# Patient Record
Sex: Female | Born: 1960 | Race: White | Hispanic: No | Marital: Married | Smoking: Former smoker
Health system: Southern US, Community
[De-identification: ages and names within clinical notes are randomized; demographics above are authoritative.]

## PROBLEM LIST (undated history)

## (undated) DIAGNOSIS — M51369 Other intervertebral disc degeneration, lumbar region without mention of lumbar back pain or lower extremity pain: Secondary | ICD-10-CM

## (undated) DIAGNOSIS — J45909 Unspecified asthma, uncomplicated: Secondary | ICD-10-CM

## (undated) DIAGNOSIS — F1011 Alcohol abuse, in remission: Secondary | ICD-10-CM

## (undated) DIAGNOSIS — R0600 Dyspnea, unspecified: Secondary | ICD-10-CM

## (undated) DIAGNOSIS — J309 Allergic rhinitis, unspecified: Secondary | ICD-10-CM

## (undated) DIAGNOSIS — G629 Polyneuropathy, unspecified: Secondary | ICD-10-CM

## (undated) DIAGNOSIS — D649 Anemia, unspecified: Secondary | ICD-10-CM

## (undated) DIAGNOSIS — F329 Major depressive disorder, single episode, unspecified: Secondary | ICD-10-CM

## (undated) DIAGNOSIS — K5909 Other constipation: Secondary | ICD-10-CM

## (undated) DIAGNOSIS — F419 Anxiety disorder, unspecified: Secondary | ICD-10-CM

## (undated) DIAGNOSIS — E119 Type 2 diabetes mellitus without complications: Secondary | ICD-10-CM

## (undated) DIAGNOSIS — Z972 Presence of dental prosthetic device (complete) (partial): Secondary | ICD-10-CM

## (undated) DIAGNOSIS — L409 Psoriasis, unspecified: Secondary | ICD-10-CM

## (undated) DIAGNOSIS — M5030 Other cervical disc degeneration, unspecified cervical region: Secondary | ICD-10-CM

## (undated) DIAGNOSIS — G4733 Obstructive sleep apnea (adult) (pediatric): Secondary | ICD-10-CM

## (undated) DIAGNOSIS — M51379 Other intervertebral disc degeneration, lumbosacral region without mention of lumbar back pain or lower extremity pain: Secondary | ICD-10-CM

## (undated) DIAGNOSIS — K589 Irritable bowel syndrome without diarrhea: Secondary | ICD-10-CM

## (undated) DIAGNOSIS — F32A Depression, unspecified: Secondary | ICD-10-CM

## (undated) DIAGNOSIS — M1990 Unspecified osteoarthritis, unspecified site: Secondary | ICD-10-CM

## (undated) DIAGNOSIS — R399 Unspecified symptoms and signs involving the genitourinary system: Secondary | ICD-10-CM

## (undated) DIAGNOSIS — R600 Localized edema: Secondary | ICD-10-CM

## (undated) DIAGNOSIS — M5137 Other intervertebral disc degeneration, lumbosacral region: Secondary | ICD-10-CM

## (undated) DIAGNOSIS — Z87448 Personal history of other diseases of urinary system: Secondary | ICD-10-CM

## (undated) DIAGNOSIS — I10 Essential (primary) hypertension: Secondary | ICD-10-CM

## (undated) DIAGNOSIS — K3184 Gastroparesis: Secondary | ICD-10-CM

## (undated) DIAGNOSIS — Z8669 Personal history of other diseases of the nervous system and sense organs: Secondary | ICD-10-CM

## (undated) DIAGNOSIS — Z9889 Other specified postprocedural states: Secondary | ICD-10-CM

## (undated) DIAGNOSIS — M5136 Other intervertebral disc degeneration, lumbar region: Secondary | ICD-10-CM

## (undated) DIAGNOSIS — M5020 Other cervical disc displacement, unspecified cervical region: Secondary | ICD-10-CM

## (undated) DIAGNOSIS — K219 Gastro-esophageal reflux disease without esophagitis: Secondary | ICD-10-CM

## (undated) DIAGNOSIS — K649 Unspecified hemorrhoids: Secondary | ICD-10-CM

## (undated) DIAGNOSIS — M5126 Other intervertebral disc displacement, lumbar region: Secondary | ICD-10-CM

## (undated) DIAGNOSIS — R112 Nausea with vomiting, unspecified: Secondary | ICD-10-CM

## (undated) DIAGNOSIS — Z8719 Personal history of other diseases of the digestive system: Secondary | ICD-10-CM

## (undated) DIAGNOSIS — Z794 Long term (current) use of insulin: Secondary | ICD-10-CM

## (undated) HISTORY — PX: TOTAL KNEE ARTHROPLASTY: SHX125

## (undated) HISTORY — DX: Personal history of other diseases of the nervous system and sense organs: Z86.69

## (undated) HISTORY — DX: Anemia, unspecified: D64.9

## (undated) HISTORY — PX: ELBOW SURGERY: SHX618

## (undated) HISTORY — DX: Gastroparesis: K31.84

## (undated) HISTORY — PX: SHOULDER ARTHROSCOPY WITH DISTAL CLAVICLE RESECTION: SHX5675

## (undated) HISTORY — DX: Essential (primary) hypertension: I10

## (undated) HISTORY — PX: KNEE ARTHROSCOPY: SUR90

## (undated) HISTORY — PX: OTHER SURGICAL HISTORY: SHX169

---

## 1988-06-01 HISTORY — PX: CHOLECYSTECTOMY OPEN: SUR202

## 1998-05-07 ENCOUNTER — Emergency Department (HOSPITAL_COMMUNITY): Admission: EM | Admit: 1998-05-07 | Discharge: 1998-05-07 | Payer: Self-pay | Admitting: Emergency Medicine

## 1998-05-08 ENCOUNTER — Encounter: Payer: Self-pay | Admitting: *Deleted

## 2000-12-30 ENCOUNTER — Encounter: Payer: Self-pay | Admitting: Family Medicine

## 2000-12-30 ENCOUNTER — Other Ambulatory Visit: Admission: RE | Admit: 2000-12-30 | Discharge: 2000-12-30 | Payer: Self-pay | Admitting: Family Medicine

## 2000-12-30 LAB — CONVERTED CEMR LAB: Pap Smear: NORMAL

## 2001-04-08 ENCOUNTER — Encounter: Payer: Self-pay | Admitting: Family Medicine

## 2001-04-08 ENCOUNTER — Encounter: Admission: RE | Admit: 2001-04-08 | Discharge: 2001-04-08 | Payer: Self-pay | Admitting: Family Medicine

## 2001-06-01 HISTORY — PX: VAGINAL HYSTERECTOMY: SUR661

## 2002-07-02 HISTORY — PX: ESOPHAGOGASTRODUODENOSCOPY: SHX1529

## 2004-04-02 ENCOUNTER — Ambulatory Visit: Payer: Self-pay | Admitting: Family Medicine

## 2004-04-22 ENCOUNTER — Ambulatory Visit: Payer: Self-pay | Admitting: Family Medicine

## 2004-05-06 ENCOUNTER — Ambulatory Visit: Payer: Self-pay | Admitting: Family Medicine

## 2004-06-12 ENCOUNTER — Ambulatory Visit: Payer: Self-pay | Admitting: Family Medicine

## 2004-06-26 ENCOUNTER — Ambulatory Visit: Payer: Self-pay | Admitting: Family Medicine

## 2004-07-17 ENCOUNTER — Ambulatory Visit: Payer: Self-pay | Admitting: Family Medicine

## 2004-08-06 ENCOUNTER — Ambulatory Visit: Payer: Self-pay | Admitting: Family Medicine

## 2004-08-06 LAB — CONVERTED CEMR LAB: Hgb A1c MFr Bld: 9.6 %

## 2004-08-14 ENCOUNTER — Ambulatory Visit: Payer: Self-pay | Admitting: Family Medicine

## 2004-08-25 ENCOUNTER — Ambulatory Visit: Payer: Self-pay | Admitting: Family Medicine

## 2005-02-09 ENCOUNTER — Ambulatory Visit: Payer: Self-pay | Admitting: Family Medicine

## 2005-02-23 ENCOUNTER — Ambulatory Visit: Payer: Self-pay | Admitting: Family Medicine

## 2005-03-25 ENCOUNTER — Ambulatory Visit: Payer: Self-pay | Admitting: Family Medicine

## 2005-04-07 ENCOUNTER — Ambulatory Visit: Payer: Self-pay | Admitting: Family Medicine

## 2005-06-09 ENCOUNTER — Ambulatory Visit: Payer: Self-pay | Admitting: Family Medicine

## 2005-06-19 ENCOUNTER — Emergency Department: Payer: Self-pay | Admitting: Emergency Medicine

## 2005-07-13 ENCOUNTER — Ambulatory Visit: Payer: Self-pay | Admitting: Family Medicine

## 2005-07-23 ENCOUNTER — Ambulatory Visit: Payer: Self-pay | Admitting: Family Medicine

## 2005-08-19 ENCOUNTER — Ambulatory Visit: Payer: Self-pay | Admitting: Family Medicine

## 2005-08-23 ENCOUNTER — Emergency Department: Payer: Self-pay | Admitting: Emergency Medicine

## 2005-08-28 ENCOUNTER — Ambulatory Visit: Payer: Self-pay | Admitting: Family Medicine

## 2005-12-22 ENCOUNTER — Ambulatory Visit: Payer: Self-pay | Admitting: Family Medicine

## 2006-02-11 ENCOUNTER — Ambulatory Visit: Payer: Self-pay | Admitting: Family Medicine

## 2006-04-02 ENCOUNTER — Ambulatory Visit: Payer: Self-pay | Admitting: Family Medicine

## 2006-05-21 ENCOUNTER — Ambulatory Visit: Payer: Self-pay | Admitting: Family Medicine

## 2006-05-28 ENCOUNTER — Ambulatory Visit: Payer: Self-pay | Admitting: Family Medicine

## 2006-07-20 ENCOUNTER — Ambulatory Visit: Payer: Self-pay | Admitting: Family Medicine

## 2006-07-22 ENCOUNTER — Ambulatory Visit: Payer: Self-pay | Admitting: Family Medicine

## 2006-09-06 ENCOUNTER — Ambulatory Visit: Payer: Self-pay | Admitting: Family Medicine

## 2006-09-24 ENCOUNTER — Emergency Department: Payer: Self-pay | Admitting: Emergency Medicine

## 2006-11-29 ENCOUNTER — Telehealth: Payer: Self-pay | Admitting: Family Medicine

## 2006-11-30 ENCOUNTER — Ambulatory Visit: Payer: Self-pay | Admitting: Family Medicine

## 2006-11-30 DIAGNOSIS — K649 Unspecified hemorrhoids: Secondary | ICD-10-CM | POA: Insufficient documentation

## 2006-11-30 DIAGNOSIS — N3020 Other chronic cystitis without hematuria: Secondary | ICD-10-CM

## 2006-11-30 DIAGNOSIS — F1011 Alcohol abuse, in remission: Secondary | ICD-10-CM | POA: Insufficient documentation

## 2006-11-30 DIAGNOSIS — J45909 Unspecified asthma, uncomplicated: Secondary | ICD-10-CM | POA: Insufficient documentation

## 2006-11-30 DIAGNOSIS — F418 Other specified anxiety disorders: Secondary | ICD-10-CM | POA: Insufficient documentation

## 2006-11-30 DIAGNOSIS — K219 Gastro-esophageal reflux disease without esophagitis: Secondary | ICD-10-CM

## 2006-11-30 DIAGNOSIS — F419 Anxiety disorder, unspecified: Secondary | ICD-10-CM | POA: Insufficient documentation

## 2006-11-30 DIAGNOSIS — E1142 Type 2 diabetes mellitus with diabetic polyneuropathy: Secondary | ICD-10-CM | POA: Insufficient documentation

## 2006-11-30 DIAGNOSIS — L409 Psoriasis, unspecified: Secondary | ICD-10-CM | POA: Insufficient documentation

## 2006-11-30 DIAGNOSIS — I10 Essential (primary) hypertension: Secondary | ICD-10-CM

## 2006-11-30 DIAGNOSIS — J309 Allergic rhinitis, unspecified: Secondary | ICD-10-CM | POA: Insufficient documentation

## 2006-11-30 LAB — CONVERTED CEMR LAB
Bilirubin Urine: NEGATIVE
Casts: 0 /lpf
Glucose, Urine, Semiquant: 250
Ketones, urine, test strip: NEGATIVE
Nitrite: POSITIVE
Specific Gravity, Urine: 1.02
Urine crystals, microscopic: 0 /hpf
Urobilinogen, UA: 0.2
Yeast, UA: 0
pH: 6

## 2006-12-06 ENCOUNTER — Encounter: Payer: Self-pay | Admitting: Family Medicine

## 2007-02-07 ENCOUNTER — Ambulatory Visit: Payer: Self-pay | Admitting: Family Medicine

## 2007-02-09 ENCOUNTER — Telehealth (INDEPENDENT_AMBULATORY_CARE_PROVIDER_SITE_OTHER): Payer: Self-pay | Admitting: *Deleted

## 2007-02-10 ENCOUNTER — Ambulatory Visit: Payer: Self-pay | Admitting: Family Medicine

## 2007-02-10 DIAGNOSIS — B029 Zoster without complications: Secondary | ICD-10-CM | POA: Insufficient documentation

## 2007-02-16 ENCOUNTER — Telehealth (INDEPENDENT_AMBULATORY_CARE_PROVIDER_SITE_OTHER): Payer: Self-pay | Admitting: *Deleted

## 2007-02-21 ENCOUNTER — Telehealth (INDEPENDENT_AMBULATORY_CARE_PROVIDER_SITE_OTHER): Payer: Self-pay | Admitting: *Deleted

## 2007-03-23 ENCOUNTER — Encounter: Payer: Self-pay | Admitting: Family Medicine

## 2007-05-04 ENCOUNTER — Ambulatory Visit: Payer: Self-pay | Admitting: Internal Medicine

## 2007-05-04 LAB — CONVERTED CEMR LAB: Rapid Strep: NEGATIVE

## 2007-05-16 ENCOUNTER — Ambulatory Visit: Payer: Self-pay | Admitting: Family Medicine

## 2007-05-20 ENCOUNTER — Ambulatory Visit: Payer: Self-pay | Admitting: Family Medicine

## 2007-06-02 HISTORY — PX: DOBUTAMINE STRESS ECHO: SHX5426

## 2007-06-13 ENCOUNTER — Telehealth: Payer: Self-pay | Admitting: Family Medicine

## 2007-06-15 ENCOUNTER — Ambulatory Visit: Payer: Self-pay | Admitting: Family Medicine

## 2007-06-16 ENCOUNTER — Encounter: Payer: Self-pay | Admitting: Family Medicine

## 2007-06-17 ENCOUNTER — Telehealth: Payer: Self-pay | Admitting: Family Medicine

## 2007-06-29 ENCOUNTER — Ambulatory Visit: Payer: Self-pay | Admitting: Family Medicine

## 2007-06-30 ENCOUNTER — Encounter: Payer: Self-pay | Admitting: Family Medicine

## 2007-07-04 ENCOUNTER — Encounter (INDEPENDENT_AMBULATORY_CARE_PROVIDER_SITE_OTHER): Payer: Self-pay | Admitting: *Deleted

## 2007-07-27 ENCOUNTER — Telehealth (INDEPENDENT_AMBULATORY_CARE_PROVIDER_SITE_OTHER): Payer: Self-pay | Admitting: *Deleted

## 2007-07-28 ENCOUNTER — Ambulatory Visit: Payer: Self-pay | Admitting: Family Medicine

## 2007-08-06 ENCOUNTER — Emergency Department: Payer: Self-pay | Admitting: Emergency Medicine

## 2007-08-09 ENCOUNTER — Encounter (INDEPENDENT_AMBULATORY_CARE_PROVIDER_SITE_OTHER): Payer: Self-pay | Admitting: *Deleted

## 2007-08-09 ENCOUNTER — Ambulatory Visit: Payer: Self-pay | Admitting: Internal Medicine

## 2007-08-11 ENCOUNTER — Encounter: Payer: Self-pay | Admitting: Internal Medicine

## 2007-08-15 ENCOUNTER — Telehealth (INDEPENDENT_AMBULATORY_CARE_PROVIDER_SITE_OTHER): Payer: Self-pay | Admitting: *Deleted

## 2007-08-18 ENCOUNTER — Ambulatory Visit: Payer: Self-pay | Admitting: Family Medicine

## 2007-08-26 ENCOUNTER — Telehealth: Payer: Self-pay | Admitting: Family Medicine

## 2007-08-31 ENCOUNTER — Encounter: Payer: Self-pay | Admitting: Internal Medicine

## 2007-08-31 DIAGNOSIS — Z8669 Personal history of other diseases of the nervous system and sense organs: Secondary | ICD-10-CM

## 2007-08-31 HISTORY — DX: Personal history of other diseases of the nervous system and sense organs: Z86.69

## 2007-09-05 ENCOUNTER — Telehealth: Payer: Self-pay | Admitting: Family Medicine

## 2007-09-10 ENCOUNTER — Telehealth: Payer: Self-pay | Admitting: Family Medicine

## 2007-09-12 ENCOUNTER — Telehealth: Payer: Self-pay | Admitting: Family Medicine

## 2007-09-13 ENCOUNTER — Telehealth: Payer: Self-pay | Admitting: Family Medicine

## 2007-09-15 ENCOUNTER — Encounter: Payer: Self-pay | Admitting: Family Medicine

## 2007-09-18 ENCOUNTER — Other Ambulatory Visit: Payer: Self-pay

## 2007-09-18 ENCOUNTER — Encounter: Payer: Self-pay | Admitting: Family Medicine

## 2007-09-18 ENCOUNTER — Emergency Department: Payer: Self-pay | Admitting: Emergency Medicine

## 2007-09-19 ENCOUNTER — Telehealth: Payer: Self-pay | Admitting: Family Medicine

## 2007-09-21 ENCOUNTER — Ambulatory Visit: Payer: Self-pay | Admitting: Family Medicine

## 2007-09-29 ENCOUNTER — Ambulatory Visit: Payer: Self-pay | Admitting: Family Medicine

## 2007-10-17 ENCOUNTER — Ambulatory Visit: Payer: Self-pay | Admitting: Family Medicine

## 2007-12-02 ENCOUNTER — Encounter: Payer: Self-pay | Admitting: Family Medicine

## 2008-02-13 ENCOUNTER — Emergency Department: Payer: Self-pay | Admitting: Emergency Medicine

## 2008-03-30 ENCOUNTER — Ambulatory Visit: Payer: Self-pay | Admitting: Family Medicine

## 2008-03-30 DIAGNOSIS — M25569 Pain in unspecified knee: Secondary | ICD-10-CM

## 2008-04-09 LAB — CONVERTED CEMR LAB
Basophils Absolute: 0 10*3/uL (ref 0.0–0.1)
CO2: 25 meq/L (ref 19–32)
Chloride: 99 meq/L (ref 96–112)
Eosinophils Relative: 1 % (ref 0–5)
Glucose, Bld: 170 mg/dL — ABNORMAL HIGH (ref 70–99)
HCT: 41.7 % (ref 36.0–46.0)
Hemoglobin: 14 g/dL (ref 12.0–15.0)
Lymphocytes Relative: 34 % (ref 12–46)
Lymphs Abs: 3.7 10*3/uL (ref 0.7–4.0)
Monocytes Absolute: 0.7 10*3/uL (ref 0.1–1.0)
Neutro Abs: 6.7 10*3/uL (ref 1.7–7.7)
Platelets: 134 10*3/uL — ABNORMAL LOW (ref 150–400)
RDW: 13.2 % (ref 11.5–15.5)
Saturation Ratios: 14 % — ABNORMAL LOW (ref 20–55)
Sodium: 136 meq/L (ref 135–145)
TIBC: 321 ug/dL (ref 250–470)
WBC: 11.1 10*3/uL — ABNORMAL HIGH (ref 4.0–10.5)

## 2008-04-18 ENCOUNTER — Ambulatory Visit: Payer: Self-pay | Admitting: Family Medicine

## 2008-04-18 LAB — CONVERTED CEMR LAB
Bilirubin Urine: NEGATIVE
Ketones, urine, test strip: NEGATIVE
Nitrite: NEGATIVE
Urobilinogen, UA: 0.2

## 2008-04-19 ENCOUNTER — Encounter: Payer: Self-pay | Admitting: Family Medicine

## 2008-05-01 ENCOUNTER — Ambulatory Visit: Payer: Self-pay | Admitting: Family Medicine

## 2008-05-01 LAB — CONVERTED CEMR LAB
Bilirubin Urine: NEGATIVE
Blood in Urine, dipstick: NEGATIVE
Glucose, Urine, Semiquant: >=1000
Ketones, urine, test strip: NEGATIVE
Protein, U semiquant: NEGATIVE
RBC / HPF: 0
Specific Gravity, Urine: 1.025
Urobilinogen, UA: 0.2
WBC, UA: 1 cells/hpf
Yeast, UA: 0

## 2008-05-04 LAB — CONVERTED CEMR LAB
Eosinophils Absolute: 0.1 10*3/uL (ref 0.0–0.7)
Glucose, Bld: 182 mg/dL — ABNORMAL HIGH (ref 70–99)
HCT: 39 % (ref 36.0–46.0)
Hemoglobin: 13.9 g/dL (ref 12.0–15.0)
MCHC: 35.6 g/dL (ref 30.0–36.0)
MCV: 83.8 fL (ref 78.0–100.0)
Monocytes Absolute: 0.3 10*3/uL (ref 0.1–1.0)
Monocytes Relative: 4 % (ref 3.0–12.0)
Neutro Abs: 5.6 10*3/uL (ref 1.4–7.7)
RDW: 12.2 % (ref 11.5–14.6)

## 2008-05-09 ENCOUNTER — Telehealth: Payer: Self-pay | Admitting: Family Medicine

## 2008-05-10 ENCOUNTER — Ambulatory Visit: Payer: Self-pay | Admitting: Family Medicine

## 2008-06-15 ENCOUNTER — Encounter: Payer: Self-pay | Admitting: Family Medicine

## 2008-06-18 ENCOUNTER — Encounter: Payer: Self-pay | Admitting: Family Medicine

## 2008-06-25 ENCOUNTER — Encounter (INDEPENDENT_AMBULATORY_CARE_PROVIDER_SITE_OTHER): Payer: Self-pay | Admitting: *Deleted

## 2008-07-09 ENCOUNTER — Ambulatory Visit: Payer: Self-pay | Admitting: Family Medicine

## 2008-07-16 ENCOUNTER — Telehealth: Payer: Self-pay | Admitting: Family Medicine

## 2008-07-23 ENCOUNTER — Ambulatory Visit: Payer: Self-pay | Admitting: Endocrinology

## 2008-07-24 ENCOUNTER — Telehealth: Payer: Self-pay | Admitting: Family Medicine

## 2008-08-10 ENCOUNTER — Encounter: Payer: Self-pay | Admitting: Family Medicine

## 2008-08-16 ENCOUNTER — Encounter (INDEPENDENT_AMBULATORY_CARE_PROVIDER_SITE_OTHER): Payer: Self-pay | Admitting: *Deleted

## 2008-09-11 ENCOUNTER — Ambulatory Visit: Payer: Self-pay | Admitting: Family Medicine

## 2008-10-02 ENCOUNTER — Ambulatory Visit: Payer: Self-pay | Admitting: Family Medicine

## 2008-10-04 ENCOUNTER — Emergency Department: Payer: Self-pay | Admitting: Emergency Medicine

## 2008-10-08 ENCOUNTER — Ambulatory Visit: Payer: Self-pay | Admitting: Family Medicine

## 2008-10-08 ENCOUNTER — Encounter: Payer: Self-pay | Admitting: Family Medicine

## 2008-10-24 ENCOUNTER — Telehealth: Payer: Self-pay | Admitting: Family Medicine

## 2008-10-30 ENCOUNTER — Ambulatory Visit: Payer: Self-pay | Admitting: Internal Medicine

## 2008-10-30 HISTORY — PX: KNEE ARTHROSCOPY W/ PARTIAL MEDIAL MENISCECTOMY: SHX1882

## 2008-11-05 ENCOUNTER — Ambulatory Visit: Payer: Self-pay | Admitting: Orthopedic Surgery

## 2008-11-07 ENCOUNTER — Ambulatory Visit: Payer: Self-pay | Admitting: Orthopedic Surgery

## 2008-11-12 ENCOUNTER — Ambulatory Visit: Payer: Self-pay | Admitting: Internal Medicine

## 2008-11-14 ENCOUNTER — Ambulatory Visit: Payer: Self-pay | Admitting: Orthopedic Surgery

## 2008-11-29 ENCOUNTER — Ambulatory Visit: Payer: Self-pay | Admitting: Internal Medicine

## 2008-12-12 ENCOUNTER — Ambulatory Visit: Payer: Self-pay | Admitting: Family Medicine

## 2008-12-12 DIAGNOSIS — E876 Hypokalemia: Secondary | ICD-10-CM | POA: Insufficient documentation

## 2008-12-12 LAB — CONVERTED CEMR LAB
Nitrite: NEGATIVE
WBC Urine, dipstick: NEGATIVE
pH: 5

## 2008-12-14 ENCOUNTER — Encounter: Payer: Self-pay | Admitting: Family Medicine

## 2008-12-14 LAB — CONVERTED CEMR LAB
Albumin: 3.8 g/dL (ref 3.5–5.2)
BUN: 13 mg/dL (ref 6–23)
CO2: 29 meq/L (ref 19–32)
Chloride: 98 meq/L (ref 96–112)

## 2008-12-28 ENCOUNTER — Ambulatory Visit: Payer: Self-pay | Admitting: Family Medicine

## 2009-01-03 ENCOUNTER — Encounter: Payer: Self-pay | Admitting: Family Medicine

## 2009-01-05 ENCOUNTER — Encounter: Payer: Self-pay | Admitting: Family Medicine

## 2009-01-19 ENCOUNTER — Encounter: Payer: Self-pay | Admitting: Family Medicine

## 2009-01-24 ENCOUNTER — Encounter: Payer: Self-pay | Admitting: Family Medicine

## 2009-02-01 ENCOUNTER — Encounter: Payer: Self-pay | Admitting: Family Medicine

## 2009-02-07 ENCOUNTER — Telehealth: Payer: Self-pay | Admitting: Family Medicine

## 2009-02-07 ENCOUNTER — Encounter: Payer: Self-pay | Admitting: Family Medicine

## 2009-02-18 ENCOUNTER — Ambulatory Visit: Payer: Self-pay | Admitting: Family Medicine

## 2009-02-26 ENCOUNTER — Encounter (INDEPENDENT_AMBULATORY_CARE_PROVIDER_SITE_OTHER): Payer: Self-pay | Admitting: *Deleted

## 2009-03-06 ENCOUNTER — Telehealth: Payer: Self-pay | Admitting: Family Medicine

## 2009-03-11 ENCOUNTER — Telehealth: Payer: Self-pay | Admitting: Family Medicine

## 2009-03-14 ENCOUNTER — Telehealth: Payer: Self-pay | Admitting: Family Medicine

## 2009-03-14 ENCOUNTER — Emergency Department: Payer: Self-pay | Admitting: Emergency Medicine

## 2009-03-25 ENCOUNTER — Telehealth: Payer: Self-pay | Admitting: Family Medicine

## 2009-03-26 ENCOUNTER — Encounter: Payer: Self-pay | Admitting: Family Medicine

## 2009-04-02 ENCOUNTER — Telehealth: Payer: Self-pay | Admitting: Family Medicine

## 2009-04-05 ENCOUNTER — Encounter: Payer: Self-pay | Admitting: Family Medicine

## 2009-04-16 ENCOUNTER — Telehealth: Payer: Self-pay | Admitting: Family Medicine

## 2009-04-29 ENCOUNTER — Telehealth: Payer: Self-pay | Admitting: Family Medicine

## 2009-05-01 ENCOUNTER — Ambulatory Visit: Payer: Self-pay | Admitting: Family Medicine

## 2009-05-02 ENCOUNTER — Encounter (INDEPENDENT_AMBULATORY_CARE_PROVIDER_SITE_OTHER): Payer: Self-pay | Admitting: *Deleted

## 2009-05-08 ENCOUNTER — Telehealth: Payer: Self-pay | Admitting: Family Medicine

## 2009-05-17 ENCOUNTER — Telehealth: Payer: Self-pay | Admitting: Family Medicine

## 2009-05-21 ENCOUNTER — Ambulatory Visit: Payer: Self-pay | Admitting: Family Medicine

## 2009-05-22 ENCOUNTER — Emergency Department: Payer: Self-pay | Admitting: Internal Medicine

## 2009-05-22 ENCOUNTER — Telehealth: Payer: Self-pay | Admitting: Family Medicine

## 2009-05-24 ENCOUNTER — Ambulatory Visit: Payer: Self-pay | Admitting: Family Medicine

## 2009-05-24 LAB — CONVERTED CEMR LAB
Bilirubin Urine: NEGATIVE
Ketones, urine, test strip: NEGATIVE
Protein, U semiquant: 30
Urobilinogen, UA: 0.2

## 2009-06-07 ENCOUNTER — Telehealth: Payer: Self-pay | Admitting: Family Medicine

## 2009-06-18 ENCOUNTER — Encounter: Payer: Self-pay | Admitting: Family Medicine

## 2009-06-19 ENCOUNTER — Ambulatory Visit: Payer: Self-pay | Admitting: Family Medicine

## 2009-07-01 ENCOUNTER — Inpatient Hospital Stay (HOSPITAL_COMMUNITY): Admission: RE | Admit: 2009-07-01 | Discharge: 2009-07-04 | Payer: Self-pay | Admitting: Orthopedic Surgery

## 2009-07-29 ENCOUNTER — Ambulatory Visit: Payer: Self-pay | Admitting: Family Medicine

## 2009-07-29 LAB — CONVERTED CEMR LAB
Bacteria, UA: 0
Bilirubin Urine: NEGATIVE
Glucose, Urine, Semiquant: NEGATIVE
Ketones, urine, test strip: NEGATIVE
WBC, UA: 0 cells/hpf
Yeast, UA: 0

## 2009-08-06 ENCOUNTER — Ambulatory Visit: Payer: Self-pay | Admitting: Family Medicine

## 2009-10-26 ENCOUNTER — Emergency Department: Payer: Self-pay | Admitting: Emergency Medicine

## 2010-01-24 ENCOUNTER — Ambulatory Visit: Payer: Self-pay | Admitting: Family Medicine

## 2010-01-24 LAB — HM DIABETES FOOT EXAM

## 2010-02-12 ENCOUNTER — Emergency Department: Payer: Self-pay | Admitting: Emergency Medicine

## 2010-02-20 ENCOUNTER — Telehealth: Payer: Self-pay | Admitting: Family Medicine

## 2010-02-24 ENCOUNTER — Encounter (INDEPENDENT_AMBULATORY_CARE_PROVIDER_SITE_OTHER): Payer: Self-pay | Admitting: *Deleted

## 2010-02-26 ENCOUNTER — Encounter: Admission: RE | Admit: 2010-02-26 | Discharge: 2010-02-26 | Payer: Self-pay | Admitting: Orthopedic Surgery

## 2010-03-03 ENCOUNTER — Ambulatory Visit: Payer: Self-pay | Admitting: Internal Medicine

## 2010-03-10 ENCOUNTER — Ambulatory Visit: Payer: Self-pay | Admitting: Internal Medicine

## 2010-03-10 ENCOUNTER — Telehealth: Payer: Self-pay | Admitting: Family Medicine

## 2010-03-17 ENCOUNTER — Ambulatory Visit: Payer: Self-pay | Admitting: Endocrinology

## 2010-03-17 LAB — CONVERTED CEMR LAB: Hgb A1c MFr Bld: 9.9 % — ABNORMAL HIGH (ref 4.6–6.5)

## 2010-03-29 ENCOUNTER — Emergency Department: Payer: Self-pay | Admitting: Unknown Physician Specialty

## 2010-04-01 ENCOUNTER — Ambulatory Visit: Payer: Self-pay | Admitting: Endocrinology

## 2010-04-02 ENCOUNTER — Ambulatory Visit: Payer: Self-pay | Admitting: Family Medicine

## 2010-04-18 ENCOUNTER — Ambulatory Visit: Payer: Self-pay | Admitting: Family Medicine

## 2010-04-18 DIAGNOSIS — R1013 Epigastric pain: Secondary | ICD-10-CM | POA: Insufficient documentation

## 2010-04-22 ENCOUNTER — Encounter: Payer: Self-pay | Admitting: Gastroenterology

## 2010-04-22 LAB — CONVERTED CEMR LAB
ALT: 15 units/L (ref 0–35)
Albumin: 4.2 g/dL (ref 3.5–5.2)
Amylase: 23 units/L (ref 0–105)
Basophils Absolute: 0 10*3/uL (ref 0.0–0.1)
CO2: 28 meq/L (ref 19–32)
Glucose, Bld: 321 mg/dL — ABNORMAL HIGH (ref 70–99)
Lipase: 28 units/L (ref 0–75)
Lymphocytes Relative: 23 % (ref 12–46)
Lymphs Abs: 2.5 10*3/uL (ref 0.7–4.0)
Neutro Abs: 7.8 10*3/uL — ABNORMAL HIGH (ref 1.7–7.7)
Neutrophils Relative %: 71 % (ref 43–77)
Platelets: 140 10*3/uL — ABNORMAL LOW (ref 150–400)
Potassium: 3.6 meq/L (ref 3.5–5.3)
RDW: 13.1 % (ref 11.5–15.5)
Sodium: 133 meq/L — ABNORMAL LOW (ref 135–145)
Total Protein: 7.2 g/dL (ref 6.0–8.3)
WBC: 11 10*3/uL — ABNORMAL HIGH (ref 4.0–10.5)

## 2010-05-13 ENCOUNTER — Ambulatory Visit: Payer: Self-pay | Admitting: Endocrinology

## 2010-05-14 ENCOUNTER — Telehealth: Payer: Self-pay | Admitting: Family Medicine

## 2010-05-14 ENCOUNTER — Encounter: Payer: Self-pay | Admitting: Family Medicine

## 2010-05-22 ENCOUNTER — Telehealth: Payer: Self-pay | Admitting: Family Medicine

## 2010-05-23 ENCOUNTER — Observation Stay (HOSPITAL_COMMUNITY)
Admission: EM | Admit: 2010-05-23 | Discharge: 2010-05-23 | Payer: Self-pay | Source: Home / Self Care | Admitting: Emergency Medicine

## 2010-05-27 ENCOUNTER — Telehealth: Payer: Self-pay | Admitting: Family Medicine

## 2010-05-28 ENCOUNTER — Telehealth: Payer: Self-pay | Admitting: Family Medicine

## 2010-06-06 ENCOUNTER — Ambulatory Visit
Admission: RE | Admit: 2010-06-06 | Discharge: 2010-06-06 | Payer: Self-pay | Source: Home / Self Care | Attending: Endocrinology | Admitting: Endocrinology

## 2010-06-10 ENCOUNTER — Ambulatory Visit: Admit: 2010-06-10 | Payer: Self-pay | Admitting: Gastroenterology

## 2010-06-20 ENCOUNTER — Ambulatory Visit
Admission: RE | Admit: 2010-06-20 | Discharge: 2010-06-20 | Payer: Self-pay | Source: Home / Self Care | Attending: Endocrinology | Admitting: Endocrinology

## 2010-07-01 NOTE — Letter (Signed)
Summary: New Patient letter  [facility]  [address] [place], [place] [address]   Phone: [phone]  Fax: [phone]       04/22/2010 MRN: [id number]  Alexa Woods [address] [place], [place]  [address]  Dear Ms. Lundstrom,  Welcome to the Gastroenterology Division at [facility].    You are scheduled to see Dr.  Jarold Motto on 05-06-10 at 9:30am on the 3rd floor at [facility], [address]  We ask that you try to arrive at our office 15 minutes prior to your appointment time to allow for check-in.  We would like you to complete the enclosed self-administered evaluation form prior to your visit and bring it with you on the day of your appointment.  We will review it with you.  Also, please bring a complete list of all your medications or, if you prefer, bring the medication bottles and we will list them.  Please bring your insurance card so that we may make a copy of it.  If your insurance requires a referral to see a specialist, please bring your referral form from your primary care physician.  Co-payments are due at the time of your visit and may be paid by cash, check or credit card.     Your office visit will consist of a consult with your physician (includes a physical exam), any laboratory testing he/she may order, scheduling of any necessary diagnostic testing (e.g. x-ray, ultrasound, CT-scan), and scheduling of a procedure (e.g. Endoscopy, Colonoscopy) if required.  Please allow enough time on your schedule to allow for any/all of these possibilities.    If you cannot keep your appointment, please call [phone] to cancel or reschedule prior to your appointment date.  This allows [place] the opportunity to schedule an appointment for another patient in need of care.  If you do not cancel or reschedule by 5 p.m. the business day prior to your appointment date, you will be charged a $50.00 late cancellation/no-show fee.    Thank you for choosing   Gastroenterology for your medical needs.  We appreciate the opportunity to care for you.  Please visit [place] at our website  to learn more about our practice.                     Sincerely,                                                             The Gastroenterology Division

## 2010-07-01 NOTE — Progress Notes (Signed)
Summary: ? bite  Phone Note Call from Patient   Caller: Patient Call For: Judith Part MD Summary of Call: Pt called stating something has bit her and she doesnt know what it was, area looks bad and is hurting and stinging.  No appts available this afternoon, advised pt to go to [facility].  Pt said she would go there today. Initial call taken by: Lowella Petties CMA,  March 10, 2010 3:06 PM  Follow-up for Phone Call        I agree thanks Follow-up by: Judith Part MD,  March 10, 2010 3:36 PM
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
## 2010-07-01 NOTE — Assessment & Plan Note (Signed)
Summary: MEDICAL CLEARANCE FOR SURGERY/DLO   Vital Signs:  Patient profile:   50 year old female Weight:      264 pounds BMI:     42.76 Temp:     97.9 degrees F oral Pulse rate:   76 / minute Pulse rhythm:   regular BP sitting:   124 / 74  (left arm)  Vitals Entered By: Lowella Petties CMA (June 19, 2009 10:34 AM) CC: Clearance for knee surgery.  Also, check dark spot on vulva.   History of Present Illness: here for visit to medically clear her for upcoming knee surgery knee surgery is sched at end of mo  Dr Despina Hick is her ortho  is trying to get into [facility] for rehab- in the process / is financially tricky   is much better from her recent resp illness   wt is up 4 lb today (unable to exercise)-eats too much due to nervousness   bp is great at 124/74- no problems with that   K has been mt with supplementation   diabetes has been fair overall  has been up and down but usually 160s / does not check as often as she should  last AIC was 9    has many med all /intol  lidocaie rash  codiene rash and vomiting  darvocet - wheeze  klonopin- addiction  no hx of blood clotting problems  no bleeding issues either  asthma - tends to only be a problem with uris   no heart problems   has a vulvar lesion - was brown and now looks black- looks like a mole  it itches / but does not hurt    Allergies: 1)  ! * Omeprazole 2)  ! 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, R knee, severe (arthroscopy proven)  psychiatrist -- Dr Koren Bound endocrine--Dr Kristopher Glee / Hyacinth Meeker  Past Surgical History: Last updated: 12/28/2008 Appendectomy Cholecystectomy Hysterectomy-  partial Arthroscopy, R knee, partial menisectomy, chondroplasty 10/2008  Pelvic US- neg (12/2000) CT head- neg (04/2001) EGD- erythematous gastropathy (07/2002) Colonoscopy- int hemorrhoids (07/2002) 5/05 stress echo normal (Dr Gwen Pounds) Wrist fracture (07/2003) 2D Echo-mild MR, EF 55% (09/2003) Pelvic US- neg (07/2004) stress echo normal 06/30/07 with EF of 65 % Bells Palsy 4/09 (CT of head showed mild prominence of 3 ventricles- overall no acute change)  Family History: Last updated: 11/30/2006 Father:  Mother: ETOH, HTN Siblings:   Social History: Last updated: 10/17/2007 Marital Status: Married Children: 2 Occupation: Development worker, community at [employer]) husband is alcoholic who is emotionally abusive  Risk Factors: Smoking Status: quit (11/30/2006)  Review of Systems General:  Denies fatigue, fever, loss of appetite, and malaise. Eyes:  Denies blurring. CV:  Denies chest pain or discomfort, lightheadness, palpitations, shortness of breath with exertion, and swelling of feet. Resp:  Denies cough, pleuritic, shortness of breath, sputum productive, and wheezing. GI:  Denies abdominal pain, change in bowel habits, and nausea. GU:  Denies dysuria and urinary frequency. MS:  Complains of joint pain and stiffness; denies cramps and muscle weakness. Derm:  Denies itching, lesion(s), poor wound healing, and rash; eczema on hands is improved spot to check on vulva. Neuro:  Denies headaches, numbness, tingling, and weakness.  Psych:  mood is fair . Endo:  Denies excessive thirst and excessive urination. Heme:  Denies abnormal bruising and bleeding.  Physical Exam  General:  obese, alert and well-developed.   Head:  normocephalic, atraumatic, and no abnormalities observed.   Eyes:  vision grossly intact, pupils equal, pupils round, and pupils reactive to light.  no conjunctival pallor, injection or icterus  Nose:  no nasal discharge.   Mouth:  pharynx pink and moist.    Neck:  supple with full rom and no masses or thyromegally, no JVD or carotid bruit  Lungs:  Normal respiratory effort, chest expands symmetrically. Lungs are clear to auscultation, no crackles or wheezes. Heart:  Normal rate and regular rhythm. S1 and S2 normal without gallop, murmur, click, rub or other extra sounds. Abdomen:  Bowel sounds positive,abdomen soft and non-tender without masses, organomegaly or hernias noted. no renal bruits  Genitalia:  see skin exam  Msk:  No deformity or scoliosis noted of thoracic or lumbar spine.  poor rom knees wearing knee brace  Pulses:  R and L carotid,radial,femoral,dorsalis pedis and posterior tibial pulses are full and equal bilaterally Extremities:  No clubbing, cyanosis, edema, or deformity noted with normal full range of motion of all joints.   Neurologic:  sensation intact to light touch, gait normal, and DTRs symmetrical and normal.   Skin:  small 2-3 mm nevus - purple / appears to be vascular on L labia majora (no other skin changes) Cervical Nodes:  No lymphadenopathy noted Inguinal Nodes:  No significant adenopathy Psych:  normal affect, talkative and pleasant   Diabetes Management Exam:    Foot Exam (with socks and/or shoes not present):       Sensory-Pinprick/Light touch:          Left medial foot (L-4): normal          Left dorsal foot (L-5): normal          Left lateral foot (S-1): normal          Right medial foot (L-4): normal          Right dorsal foot (L-5): normal          Right lateral foot (S-1): normal       Sensory-Monofilament:          Left foot: normal          Right foot: normal       Inspection:          Left foot: normal          Right foot: normal       Nails:          Left foot: normal          Right foot: normal   Impression & Recommendations:  Problem # 1:  PREOPERATIVE EXAMINATION (ICD-V72.84) Assessment New disc operative risks  pt is stable from cardiopulmonary standpoint- but needs clearance in  terms of DM from her endocrinologist (will sched f/u for that)  no restrictions for surgery  recommend watching K level- which has been stable with supplementation  pvc on EKG is not worrisome and no cardiac hx  asthma is only a problem with uri/ infections did review med allergies   Problem # 2:  NEOPLASM UNCERTAIN BHV OTH&UNSPEC FE GENIT ORGN (ICD-236.3) Assessment: New small nevus L labia that appears to be venous/ vascular  suspect benign- will continue to watch adv to call if change in size or shapr  Complete Medication List: 1)  Zoloft 100 Mg Tabs (sertraline Hcl)  .... Take 1 tablet by mouth two times a day 2)  Ambien 10 Mg Tabs (Zolpidem tartrate) .... Take one by mouth q hs 3)  Hydrochlorothiazide 25 Mg Tabs (Hydrochlorothiazide) .... Take one by mouth daily 4)  Patanol Soln (Olopatadine hcl soln) .... Use as directed prn 5)  Protonix 40 Mg Tbec (Pantoprazole sodium) .Marland Kitchen.. 1 by mouth qd 6)  Ventolin Hfa 108 (90 Base) Mcg/act Aers (Albuterol sulfate) .... One to two inhalations every 4 hours as needed for shortness of breath and wheezing. 7)  Advair Diskus 100-50 Mcg/dose Misc (Fluticasone-salmeterol) .... One inhalation twice a day as needed. 8)  Flonase 50 Mcg/act Susp (Fluticasone propionate) .... 2 sprays in each nostril once daily during allergy season 9)  Lantus 100 Unit/ml Soln (Insulin glargine) .... Inject 30 units daily 10)  Easy Touch Pen Needles 31g X 8 Mm Misc (Insulin pen needle) .... Use as directed 11)  Bayer Breeze 2 Test Disk (Glucose blood) .... Check blood sugar before meals and at bedtime 12)  Amaryl 4 Mg Tabs (Glimepiride) .Marland Kitchen.. 1 by mouth once daily 13)  Hydrocodone-acetaminophen 5-500 Mg Tabs (Hydrocodone-acetaminophen) .Marland Kitchen.. 1 -2 tab by mouth up to every 6 hours prn 14)  Klor-con 10 10 Meq Cr-tabs (Potassium chloride) .... Take two by mouth daily 15)  Nystatin 100000 Unit/gm Crea (Nystatin) .... Apply to affected area two times a day as needed 16)  Antivert  25 Mg Tabs (Meclizine hcl) .... As needed -- given from er for vertigo  Other Orders: Flu Vaccine 47yrs + [id number]) Admin 1st Vaccine (60454) Admin 1st Vaccine Horticulturist, commercial) [phone]) Endocrinology Referral (Endocrine)  Patient Instructions: 1)  you are cleared for surgery from me - but need clearance from Dr Hyacinth Meeker as well 2)  we will refer you to her at check out  3)  if spot on vulva changes or causes symptoms - please let me know   Prior Medications (reviewed today): ZOLOFT 100 MG  TABS (SERTRALINE HCL) () Take 1 tablet by mouth two times a day AMBIEN 10 MG  TABS (ZOLPIDEM TARTRATE) take one by mouth q hs HYDROCHLOROTHIAZIDE 25 MG  TABS (HYDROCHLOROTHIAZIDE) take one by mouth daily PATANOL   SOLN (OLOPATADINE HCL SOLN) use as directed prn PROTONIX 40 MG  TBEC (PANTOPRAZOLE SODIUM) 1 by mouth qd VENTOLIN HFA 108 (90 BASE) MCG/ACT  AERS (ALBUTEROL SULFATE) One to two inhalations every 4 hours as needed for shortness of breath and wheezing. ADVAIR DISKUS 100-50 MCG/DOSE  MISC (FLUTICASONE-SALMETEROL) One inhalation twice a day as needed. FLONASE 50 MCG/ACT  SUSP (FLUTICASONE PROPIONATE) 2 sprays in each nostril once daily during allergy season LANTUS 100 UNIT/ML  SOLN (INSULIN GLARGINE) inject 30 units daily EASY TOUCH PEN NEEDLES 31G X 8 MM MISC (INSULIN PEN NEEDLE) use as directed BAYER BREEZE 2 TEST  DISK (GLUCOSE BLOOD) Check blood sugar before meals and at bedtime AMARYL 4 MG TABS (GLIMEPIRIDE) 1 by mouth once daily HYDROCODONE-ACETAMINOPHEN 5-500 MG TABS (HYDROCODONE-ACETAMINOPHEN) 1 -2 tab by mouth up to every 6 hours prn KLOR-CON 10 10 MEQ CR-TABS (POTASSIUM CHLORIDE) take two by mouth daily NYSTATIN 100000 UNIT/GM CREA (NYSTATIN) apply to affected area two times a day as needed ANTIVERT 25 MG TABS (MECLIZINE HCL) as needed -- given from ER for vertigo Current Allergies: ! * OMEPRAZOLE ! LIDOCAINE CODEINE SULFA DARVOCET GLUCOPHAGE * TRAZADONE * KLONIPIN *  TEQUIN PAXIL   Influenza Vaccine    Vaccine Type: Fluvax 3+    Site:  left deltoid    Mfr: GlaxoSmithKline    Dose: 0.5 ml    Route: IM    Given by: Lowella Petties CMA    Exp. Date: 11/28/2009    Lot #: [id number]    VIS given: 12/23/06 version given June 19, 2009.  Flu Vaccine Consent Questions    Do you have a history of severe allergic reactions to this vaccine? no    Any prior history of allergic reactions to egg and/or gelatin? no    Do you have a sensitivity to the preservative Thimersol? no    Do you have a past history of Guillan-Barre Syndrome? no    Do you currently have an acute febrile illness? no    Have you ever had a severe reaction to latex? no    Vaccine information given and explained to patient? yes    Are you currently pregnant? no      EKG  Procedure date:  06/19/2009  Findings:      NSR with rate of 84 few PVCs  no acute changes

## 2010-07-01 NOTE — Progress Notes (Signed)
Summary: regarding diabetic meds  Phone Note Call from Patient Call back at Home Phone [phone]   Caller: Patient Call For: Judith Part MD Summary of Call: Pt has been getting her diabetic meds from her endocrinologist, but it has been almost a year since she has been there, and she cant afford to go now.  She is asking if you will start prescribing her meds again.  I told her that she needs an office visit to follow up but she says she cant afford to come her either.   Please advise.  [facility]. Initial call taken by: Lowella Petties CMA,  February 20, 2010 4:01 PM  Follow-up for Phone Call        she really needs to stay with her diabetic doctor since her DM is so difficult to control  I advise she call that office and ask about a payment plan for visits  if she has no insurance - they may be able to hook her up with a clinic like [facility] Follow-up by: Judith Part MD,  February 20, 2010 7:51 PM  Additional Follow-up for Phone Call Additional follow up Details #1::        Left message for patient to call back. [name] LPN  February 21, 2010 12:56 PM     Advised pt. She says she owes that office too much money and cant go back there.  She says if she has to she will just have to stop her meds.  She says she cant go to [facility] because whenever she trys to go somewhere for help she is told that, counting her husbands income, they make too much money.  She says there is a bad situation at home and her hours have been cut at work.  I told her she cant stop her meds.  Any suggestions? Additional Follow-up by: Lowella Petties CMA,  February 21, 2010 2:08 PM    Additional Follow-up for Phone Call Additional follow up Details #2::    she really needs to see an endocrinologist-- I can try to ref her to someone new Follow-up by: Judith Part MD,  February 21, 2010 2:17 PM  Additional Follow-up for Phone Call Additional follow up Details #3:: Details for Additional  Follow-up Action Taken: Patient notified as instructed by telephone. Pt said she is willing to see a new endocrinologist. Pt has already seen  Dr Hyacinth Meeker at [facility] and Dr Charna Busman. pt will wait to hear from the patient care coordinator.[name] LPN  February 21, 2010 2:43 PM   I will do referral to endo for hard to control DM for Shirlee Limerick  this may be difficult - I think we need a clinic that will work with her on basis of a payment plan Additional Follow-up by: Judith Part MD,  February 21, 2010 3:24 PM

## 2010-07-01 NOTE — Assessment & Plan Note (Signed)
Summary: TICK BITE   Vital Signs:  Patient profile:   50 year old female Height:      66 inches Weight:      255.25 pounds BMI:     41.35 Temp:     98.1 degrees F oral Pulse rate:   88 / minute Pulse rhythm:   regular BP sitting:   104 / 70  (left arm) Cuff size:   large  Vitals Entered By: [name] LPN (January 24, 2010 2:49 PM) CC: Tick on back removed about three days ago. Rash around where tick was and pt does not "feel good".   History of Present Illness: had a spot on back that kept itching  her son looked at it - and got it out with tweezers -- that was 3 days  ? what day she got it -- has had a lot this year  has dogs that bring them  size consistent with dog tick   now does not feel well neck is sore , is generally achey  has a rash around tick bite  no rash anywhere else some upset stomach- and nausea and diarrhea no fever   Allergies: 1)  ! * Omeprazole 2)  ! 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 Syndrome B Osteoarthritis, R knee, severe (arthroscopy proven) eczema - hands   psychiatrist -- Dr Koren Bound endocrine--Dr Kristopher Glee / Hyacinth Meeker  Past Surgical History: Last updated: 12/28/2008 Appendectomy Cholecystectomy Hysterectomy- partial Arthroscopy, R knee, partial menisectomy, chondroplasty 10/2008  Pelvic US- neg (12/2000) CT head- neg (04/2001) EGD- erythematous gastropathy (07/2002) Colonoscopy- int hemorrhoids (07/2002) 5/05 stress echo normal (Dr Gwen Pounds) Wrist fracture (07/2003) 2D Echo-mild MR, EF 55% (09/2003) Pelvic US- neg (07/2004) stress echo normal 06/30/07 with EF of 65 % Bells Palsy 4/09 (CT of head showed mild prominence of 3 ventricles- overall no acute  change)  Family History: Last updated: 11/30/2006 Father:  Mother: ETOH, HTN Siblings:   Social History: Last updated: 10/17/2007 Marital Status: Married Children: 2 Occupation: Development worker, community at [employer]) husband is alcoholic who is emotionally abusive  Risk Factors: Smoking Status: quit (11/30/2006)  Review of Systems General:  Complains of fatigue; denies chills, fever, loss of appetite, and malaise. Eyes:  Denies blurring and eye irritation. CV:  Denies chest pain or discomfort, lightheadness, and palpitations. Resp:  Denies cough and wheezing. GI:  Denies abdominal pain, change in bowel habits, and indigestion. GU:  Denies dysuria. MS:  Complains of joint pain and stiffness; denies joint redness, joint swelling, and cramps. Derm:  Complains of insect bite(s) and lesion(s); denies rash. Neuro:  Denies headaches, numbness, and tingling. Heme:  Denies abnormal bruising and bleeding.  Physical Exam  General:  obese, alert and well-developed.   Head:  normocephalic, atraumatic, and no abnormalities observed.   Eyes:  vision grossly intact, pupils equal, pupils round, pupils reactive to light, and no injection.   Mouth:  pharynx pink and moist.   Neck:  supple with full rom and no masses or thyromegally, no JVD or carotid bruit  Chest Wall:  No deformities, masses, or tenderness noted. Lungs:  Normal respiratory effort, chest expands symmetrically. Lungs are clear to auscultation, no crackles or wheezes. Heart:  Normal rate and regular  rhythm. S1 and S2 normal without gallop, murmur, click, rub or other extra sounds. Abdomen:  soft and non-tender.   Msk:  no acute joint changes  Extremities:  No clubbing, cyanosis, edema, or deformity noted with normal full range of motion of all joints.   Neurologic:  sensation intact to light touch, gait normal, and DTRs symmetrical and normal.   Skin:  tick bite under bra -- with 1 cm surrounding redness no target shape or  feautures  Cervical Nodes:  No lymphadenopathy noted Inguinal Nodes:  No significant adenopathy Psych:  normal affect, talkative and pleasant   Diabetes Management Exam:    Foot Exam (with socks and/or shoes not present):       Sensory-Pinprick/Light touch:          Left medial foot (L-4): normal          Left dorsal foot (L-5): normal          Left lateral foot (S-1): normal          Right medial foot (L-4): normal          Right dorsal foot (L-5): normal          Right lateral foot (S-1): normal       Sensory-Monofilament:          Left foot: normal          Right foot: normal       Inspection:          Left foot: normal          Right foot: normal       Nails:          Left foot: normal          Right foot: normal   Impression & Recommendations:  Problem # 1:  TICK BITE (ICD-E906.4)  from presumed dog tick  some redness around bite site -- and general malaise  some acheiness  will tx empirically for tick fever with doxycycline and update if worse or not imp   Orders: Prescription Created Electronically [id number])  Complete Medication List: 1)  Zoloft 100 Mg Tabs (sertraline Hcl)  .... Take 1 tablet by mouth two times a day 2)  Ambien 10 Mg Tabs (Zolpidem tartrate) .... Take one by mouth at bedtime 3)  Hydrochlorothiazide 25 Mg Tabs (Hydrochlorothiazide) .... Take one by mouth daily 4)  Patanol Soln (Olopatadine hcl soln) .... Use as directed prn 5)  Protonix 40 Mg Tbec (Pantoprazole sodium) .Marland Kitchen.. 1 by mouth every day 6)  Ventolin Hfa 108 (90 Base) Mcg/act Aers (Albuterol sulfate) .... One to two inhalations every 4 hours as needed for shortness of breath and wheezing. 7)  Advair Diskus 100-50 Mcg/dose Misc (Fluticasone-salmeterol) .... One inhalation twice a day as needed. 8)  Flonase 50 Mcg/act Susp (Fluticasone propionate) .... 2 sprays in each nostril once daily during allergy season 9)  Lantus 100 Unit/ml Soln (Insulin glargine) .... Inject 32 units daily at  suppertime 10)  Easy Touch Pen Needles 31g X 8 Mm Misc (Insulin pen needle) .... Use as directed 11)  Bayer Breeze 2 Test Disk (Glucose blood) .... Check blood sugar before meals and at bedtime 12)  Amaryl 4 Mg Tabs (Glimepiride) .Marland Kitchen.. 1 by mouth once daily 13)  Hydrocodone-acetaminophen 5-500 Mg Tabs (Hydrocodone-acetaminophen) .Marland Kitchen.. 1 -2 tab by mouth up to every 6 hours prn 14)  Klor-con 10 10 Meq Cr-tabs (Potassium chloride) .... Take two by mouth daily 15)  Nystatin 100000 Unit/gm  Crea (Nystatin) .... Apply to affected area two times a day as needed 16)  Antivert 25 Mg Tabs (Meclizine hcl) .... As needed -- given from er for vertigo 17)  Methocarbamol 500 Mg Tabs (Methocarbamol) .... Take one tablet every six hours as needed 18)  Novolog Pt Not Sure If 100 or 70/30  .... Sliding scale as needed 19)  Aspir-low 81 Mg Tbec (Aspirin) .Marland Kitchen.. 1 by mouth once daily with food 20)  Doxycycline Hyclate 100 Mg Caps (Doxycycline hyclate) .Marland Kitchen.. 1 by mouth two times a day for 7 days  Patient Instructions: 1)  you can use antibiotic ointment on tick bite during the day (triple antibiotic)  2)  and then use cort aid at night  3)  take the doxycycline two times a day for 1 week for possible tick related illness 4)  update me if your symptoms do not improve  Prescriptions: KLOR-CON 10 10 MEQ CR-TABS (POTASSIUM CHLORIDE) take two by mouth daily  #60 x 11   Entered and Authorized by:   Judith Part MD   Signed by:   Judith Part MD on 01/24/2010   Method used:   Electronically to        [facility]* (retail)       [address]       [place], [place]  [address]       Ph: [phone]       Fax: [phone]   RxID:   [id number] DOXYCYCLINE HYCLATE 100 MG CAPS (DOXYCYCLINE HYCLATE) 1 by mouth two times a day for 7 days  #14 x 0   Entered and Authorized by:   Judith Part MD   Signed by:   Judith Part MD on 01/24/2010   Method used:   Electronically to        [facility]* (retail)        [address]       [place], [place]  [address]       Ph: [phone]       Fax: [phone]   RxID:   [id number]   Current Allergies (reviewed today): ! * OMEPRAZOLE ! LIDOCAINE CODEINE SULFA DARVOCET GLUCOPHAGE * TRAZADONE * KLONIPIN * TEQUIN PAXIL
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
## 2010-07-01 NOTE — Miscellaneous (Signed)
Summary: Controlled Substance Agreement  Controlled Substance Agreement   Imported By: Lanelle Bal 01/31/2010 13:06:10  _____________________________________________________________________  External Attachment:    Type:   Image     Comment:   External Document

## 2010-07-01 NOTE — Progress Notes (Signed)
Summary: Yeast infection  Phone Note Call from Patient Call back at Home Phone [phone]   Caller: Patient Call For: Judith Part MD Summary of Call: Patient asks if she could get an Rx. for a yeast infection.  She says she has been on ABX recently and now has a yeast infection.  Financial planner. Initial call taken by: Delilah Shan CMA (AAMA),  June 07, 2009 9:05 AM  Follow-up for Phone Call        px written on EMR for call in diflucan please f/u if not imp Follow-up by: Judith Part MD,  June 07, 2009 10:17 AM  Additional Follow-up for Phone Call Additional follow up Details #1::        Med called to [facility], pt advised. Additional Follow-up by: Lowella Petties CMA,  June 07, 2009 10:30 AM    New/Updated Medications: DIFLUCAN 150 MG TABS (FLUCONAZOLE) 1 by mouth times one for yeast infx Prescriptions: DIFLUCAN 150 MG TABS (FLUCONAZOLE) 1 by mouth times one for yeast infx  #1 x 0   Entered and Authorized by:   Judith Part MD   Signed by:   Lowella Petties CMA on 06/07/2009   Method used:   Telephoned to ...       [facility]* (retail)       [address]       [place], [place]  [address]       Ph: [phone]       Fax: [phone]   RxID:   [id number]
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
## 2010-07-01 NOTE — Letter (Signed)
Summary: Primary Care Consult Scheduled Letter  [facility]  [address] [place], [place] [address]   Phone: [phone]  Fax: [phone]      02/24/2010 MRN: [id number]  Alexa Woods [address] [place], [place]  [address]    Dear Ms. Andonian,      We have scheduled an appointment for you.  At the recommendation of Dr.Marne Tower, we have scheduled you a consult with Dr Romero Belling on Monday,October 17th at 3:55pm.  Their address is [address],[place] directly across from [facility] . The office phone number is [phone].  If this appointment day and time is not convenient for you, please feel free to call the office of the doctor you are being referred to at the number listed above and reschedule the appointment.     It is important for you to keep your scheduled appointments. We are here to make sure you are given good patient care. If you have questions or you have made changes to your appointment, please notify us at  [phone], ask for [facility].    Thank you,  Patient Care Coordinator [facility]

## 2010-07-01 NOTE — Assessment & Plan Note (Signed)
Summary: BRONCHITIS-SEEN @ [facility] ER ON 1/29/   Vital Signs:  Patient profile:   50 year old female Height:      66 inches Weight:      254.50 pounds BMI:     41.23 Temp:     97.9 degrees F oral Pulse rate:   64 / minute Pulse rhythm:   regular BP sitting:   132 / 90  (left arm) Cuff size:   large  Vitals Entered By: Linde Gillis CMA  Dull) (April 02, 2010 3:50 PM) CC: ? bronchitis   History of Present Illness: Alexa Woods to [facility] Saturday.  Was coughing up discolored sputum.  Chills at that point.  [place].  Fatigued.  CXR done per patient and was told she had bronchitis.  Finished zpack today.  Still with yellow sputum.  Still with some wheeze.  H/o asthma, using advair since yesterday, but had been out of it before.   Using albuterol as needed, rarely.  Feels some better with SABA use; gets jittery with use.  Hadn't tried using 1 puff.  Still with some chills but no fevers.  Still some achy feeling.    Allergies: 1)  ! * Omeprazole 2)  ! Lidocaine 3)  ! Tramadol Hcl 4)  Codeine 5)  Sulfa 6)  Darvocet 7)  Glucophage 8)  * Trazadone 9)  * Klonipin 10)  * Tequin 11)  Paxil  Review of Systems       See HPI.  Otherwise negative.    Physical Exam  General:  GEN: nad, alert and oriented HEENT: mucous membranes moist, TM w/o erythema, nasal epithelium injected, OP with cobblestoning NECK: supple w/o LA CV: rrr. PULM: no inc wob but occ wheeze, no rales ABD: soft, +bs EXT: no edema    Impression & Recommendations:  Problem # 1:  BRONCHITIS- ACUTE (ICD-466.0) 4 samples of advair given.  Start back with advair and use SABA as needed along with tessalon as needed.  No other work up now. Okay for outpatient follow up. I d/w patient about using advair two times a day regardless of symptoms and SABA as needed.  She should gradually get better. She understood.  Her updated medication list for this problem includes:    Ventolin Hfa 108 (90 Base) Mcg/act Aers (Albuterol sulfate) .....  One to two inhalations every 4 hours as needed for shortness of breath and wheezing.    Advair Diskus 100-50 Mcg/dose Misc (Fluticasone-salmeterol) ..... One inhalation twice a day as needed.    Benzonatate 200 Mg Caps (Benzonatate) .Marland Kitchen... 1 tab three times a day as needed for cough  Complete Medication List: 1)  Zoloft 100 Mg Tabs (sertraline Hcl)  .... Take 1 tablet by mouth two times a day 2)  Ambien 10 Mg Tabs (Zolpidem tartrate) .... Take one by mouth at bedtime 3)  Hydrochlorothiazide 25 Mg Tabs (Hydrochlorothiazide) .... Take one by mouth daily 4)  Patanol Soln (Olopatadine hcl soln) .... Use as directed prn 5)  Protonix 40 Mg Tbec (Pantoprazole sodium) .Marland Kitchen.. 1 by mouth every day 6)  Ventolin Hfa 108 (90 Base) Mcg/act Aers (Albuterol sulfate) .... One to two inhalations every 4 hours as needed for shortness of breath and wheezing. 7)  Advair Diskus 100-50 Mcg/dose Misc (Fluticasone-salmeterol) .... One inhalation twice a day as needed. 8)  Flonase 50 Mcg/act Susp (Fluticasone propionate) .... 2 sprays in each nostril once daily during allergy season 9)  Easy Touch Pen Needles 31g X 8 Mm Misc (Insulin pen needle) .Marland KitchenMarland KitchenMarland Kitchen  Use as directed 10)  Bayer Breeze 2 Test Disk (Glucose blood) .... Check blood sugar before meals and at bedtime 11)  Hydrocodone-acetaminophen 5-500 Mg Tabs (Hydrocodone-acetaminophen) .Marland Kitchen.. 1 -2 tab by mouth up to every 6 hours prn 12)  Klor-con 10 10 Meq Cr-tabs (Potassium chloride) .... Take two by mouth daily 13)  Nystatin 100000 Unit/gm Crea (Nystatin) .... Apply to affected area two times a day as needed 14)  Aspir-low 81 Mg Tbec (Aspirin) .Marland Kitchen.. 1 by mouth once daily with food 15)  Lorazepam 1 Mg Tabs (Lorazepam) .... As needed 16)  Humalog Mix 75/25 Kwikpen 75-25 % Susp (Insulin lispro prot & lispro) .... 30 units with breakfast, and 20 units with the evening meal, and pen needles two times a day 17)  Benzonatate 200 Mg Caps (Benzonatate) .Marland Kitchen.. 1 tab three times a day as  needed for cough  Patient Instructions: 1)  I would use the albuterol 1-2 puffs every 4 hours as needed for cough. 2)  Inhale the advair 1 puff two times a day every day. 3)  I would get the tessalon from [facility] for your cough.  4)  Out of work today and tomorrow due to cough, potentially contagious.   5)  follow up as needed.  This should gradually get better.  Take care.    Orders Added: 1)  Est. Patient Level III [03474]    Current Allergies (reviewed today): ! * OMEPRAZOLE ! LIDOCAINE ! TRAMADOL HCL CODEINE SULFA DARVOCET GLUCOPHAGE * TRAZADONE * KLONIPIN * TEQUIN PAXIL

## 2010-07-01 NOTE — Assessment & Plan Note (Signed)
Summary: POST OPERATION OF KNEE REPLACEMENT / LFW   Vital Signs:  Patient profile:   50 year old female Height:      66 inches Weight:      256.25 pounds BMI:     41.51 Temp:     98.8 degrees F oral Pulse rate:   80 / minute Pulse rhythm:   regular BP sitting:   104 / 70  (left arm) Cuff size:   large  Vitals Entered By: [name] LPN (July 29, 2009 3:44 PM)  History of Present Illness: had her knee replaced -- and is 2 weeks out of rehab  is starting to put weight on it and PT -- walking with a walker so far  glad she stayed in the rehab   pain is still a big issue as she heals - still pretty swollen  Dr Berton Lan said she was doing fairly well   is taking hydrocodone and muscle relaxer  called ortho and waiting for a call back   sugars were still high - gave her insulin in the hospital  is done with the coumadin   ? when follow up with Dr Hyacinth Meeker- when she can afford it   sugars are fairly stable -- occasionally goes very high for no apparent reason, however   thinks her average sugar has been around 140s  has lost 8 lbs -- uncomfortable but sticking to her DM diet   bp today high at pt and much better now   a lot of illness in house  lots of stress - no one helps her keep up the house    on way out mentions worse urine incontinence and bad odor- wants to check that  no dysuria      Allergies: 1)  ! * Omeprazole 2)  ! 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, R knee, severe (arthroscopy proven)  psychiatrist -- Dr Koren Bound endocrine--Dr Kristopher Glee / Hyacinth Meeker  Past Surgical History: Last updated: 12/28/2008 Appendectomy Cholecystectomy Hysterectomy-  partial Arthroscopy, R knee, partial menisectomy, chondroplasty 10/2008  Pelvic US- neg (12/2000) CT head- neg (04/2001) EGD- erythematous gastropathy (07/2002) Colonoscopy- int hemorrhoids (07/2002) 5/05 stress echo normal (Dr Gwen Pounds) Wrist fracture (07/2003) 2D Echo-mild MR, EF 55% (09/2003) Pelvic US- neg (07/2004) stress echo normal 06/30/07 with EF of 65 % Bells Palsy 4/09 (CT of head showed mild prominence of 3 ventricles- overall no acute change)  Family History: Last updated: 11/30/2006 Father:  Mother: ETOH, HTN Siblings:   Social History: Last updated: 10/17/2007 Marital Status: Married Children: 2 Occupation: Development worker, community at [employer]) husband is alcoholic who is emotionally abusive  Risk Factors: Smoking Status: quit (11/30/2006)  Review of Systems General:  Complains of fatigue; denies chills, fever, loss of appetite, and malaise. Eyes:  Denies blurring. CV:  Denies chest pain or discomfort and palpitations. Resp:  Denies cough, shortness of breath, and wheezing. GI:  Denies abdominal pain and change in bowel habits. GU:  Complains of incontinence; denies dysuria and urinary frequency. MS:  Complains of joint pain; denies joint redness. Derm:  Denies itching, lesion(s), poor wound healing, and rash. Neuro:  Denies numbness and tingling. Endo:  Denies excessive thirst and excessive urination.  Physical  Exam  Msk:  well healing scar R knee - with mod swelling and limited rom ambulates with walker well  Pulses:  R and L carotid,radial,femoral,dorsalis pedis and posterior tibial pulses are full and equal bilaterally Extremities:  pitting edema just below R knee   Impression & Recommendations:  Problem # 1:  OSTEOARTHRITIS, KNEE, RIGHT (ICD-715.96) Assessment Improved  s/p knee repl - with progress in PT but still significant post op pain will be f/u with ortho soon - overall is pleased  will inc activity as tolerated  Her updated  medication list for this problem includes:    Hydrocodone-acetaminophen 5-500 Mg Tabs (Hydrocodone-acetaminophen) .Marland Kitchen... 1 -2 tab by mouth up to every 6 hours prn    Methocarbamol 500 Mg Tabs (Methocarbamol) .Marland Kitchen... Take one tablet every six hours as needed    Aspir-low 81 Mg Tbec (Aspirin) .Marland Kitchen... 1 by mouth once daily with food  Orders: Prescription Created Electronically [phone])  Problem # 2:  HYPOKALEMIA (ICD-276.8) Assessment: Unchanged  imp in hosp after surg rev hosp d/c notes with pt  med refilled and sent to pharmacy  Orders: Prescription Created Electronically [id number])  Problem # 3:  HYPERTENSION (ICD-401.9) Assessment: Unchanged  bp is very good - no change in diuretic  did ask pt to add on low dose asa  f/u in 6 mo   Her updated medication list for this problem includes:    Hydrochlorothiazide 25 Mg Tabs (Hydrochlorothiazide) .Marland Kitchen... Take one by mouth daily  BP today: 104/70 Prior BP: 124/74 (06/19/2009)  Labs Reviewed: K+: 3.9 (05/21/2009) Creat: : 0.9 (12/12/2008)     Orders: Prescription Created Electronically [phone])  Problem # 4:  DIABETES MELLITUS, TYPE II (ICD-250.00) Assessment: Comment Only  sounds like sugars have normalized - also with better diet  adv to f/u with endo when due for f/u and can afford it  Her updated medication list for this problem includes:    Lantus 100 Unit/ml Soln (Insulin glargine) ..... Inject 32 units daily at suppertime    Amaryl 4 Mg Tabs (Glimepiride) .Marland Kitchen... 1 by mouth once daily    Aspir-low 81 Mg Tbec (Aspirin) .Marland Kitchen... 1 by mouth once daily with food  Orders: Prescription Created Electronically [phone])  Problem # 5:  Hx of CYSTITIS, CHRONIC (ICD-595.2) Assessment: Deteriorated some urine odor- left ua which was neg urged to inc water intake and avoid other beverages  consider urol f/u if needed  Complete Medication List: 1)  Zoloft 100 Mg Tabs (sertraline Hcl)  .... Take 1 tablet by mouth two times a day 2)  Ambien 10  Mg Tabs (Zolpidem tartrate) .... Take one by mouth at bedtime 3)  Hydrochlorothiazide 25 Mg Tabs (Hydrochlorothiazide) .... Take one by mouth daily 4)  Patanol Soln (Olopatadine hcl soln) .... Use as directed prn 5)  Protonix 40 Mg Tbec (Pantoprazole sodium) .Marland Kitchen.. 1 by mouth every day 6)  Ventolin Hfa 108 (90 Base) Mcg/act Aers (Albuterol sulfate) .... One to two inhalations every 4 hours as needed for shortness of breath and wheezing. 7)  Advair Diskus 100-50 Mcg/dose Misc (Fluticasone-salmeterol) .... One inhalation twice a day as needed. 8)  Flonase 50 Mcg/act Susp (Fluticasone propionate) .... 2 sprays in each nostril once daily during allergy season 9)  Lantus 100 Unit/ml Soln (Insulin glargine) .... Inject 32 units daily at suppertime 10)  Easy Touch Pen Needles 31g X 8 Mm Misc (Insulin pen needle) .... Use as directed 11)  Bayer Breeze 2 Test Disk (Glucose blood) .... Check blood sugar  before meals and at bedtime 12)  Amaryl 4 Mg Tabs (Glimepiride) .Marland Kitchen.. 1 by mouth once daily 13)  Hydrocodone-acetaminophen 5-500 Mg Tabs (Hydrocodone-acetaminophen) .Marland Kitchen.. 1 -2 tab by mouth up to every 6 hours prn 14)  Klor-con 10 10 Meq Cr-tabs (Potassium chloride) .... Take two by mouth daily 15)  Nystatin 100000 Unit/gm Crea (Nystatin) .... Apply to affected area two times a day as needed 16)  Antivert 25 Mg Tabs (Meclizine hcl) .... As needed -- given from er for vertigo 17)  Methocarbamol 500 Mg Tabs (Methocarbamol) .... Take one tablet every six hours as needed 18)  Novolog Pt Not Sure If 100 or 70/30  .... Sliding scale 19)  Aspir-low 81 Mg Tbec (Aspirin) .Marland Kitchen.. 1 by mouth once daily with food  Patient Instructions: 1)  follow up with orthopedics as planned and continue PT  2)  talk to Dr Berton Lan about pain control as planned  3)  start 81 mg aspirin coated once daily with food 4)  follow up with Dr Hyacinth Meeker when able  5)  follow up with me in  6 months unless needed earlier   Prescriptions: KLOR-CON 10  10 MEQ CR-TABS (POTASSIUM CHLORIDE) take two by mouth daily  #60 x 11   Entered and Authorized by:   Judith Part MD   Signed by:   Judith Part MD on 07/29/2009   Method used:   Electronically to        [facility]* (retail)       [address]       [place], [place]  [address]       Ph: [phone]       Fax: [phone]   RxID:   [id number] PROTONIX 40 MG  TBEC (PANTOPRAZOLE SODIUM) 1 by mouth every day  #30 x 11   Entered and Authorized by:   Judith Part MD   Signed by:   Judith Part MD on 07/29/2009   Method used:   Electronically to        [facility]* (retail)       [address]       [place], [place]  [address]       Ph: [phone]       Fax: [phone]   RxID:   [id number]   Current Allergies (reviewed today): ! * OMEPRAZOLE ! LIDOCAINE CODEINE SULFA DARVOCET GLUCOPHAGE * TRAZADONE Sherry Ruffing PAXIL   Laboratory Results   Urine Tests  Date/Time Received: July 29, 2009 4:51 PM  Date/Time Reported: July 29, 2009 4:51 PM   Routine Urinalysis   Color: yellow Appearance: Hazy Glucose: negative   (Normal Range: Negative) Bilirubin: negative   (Normal Range: Negative) Ketone: negative   (Normal Range: Negative) Spec. Gravity: 1.015   (Normal Range: 1.003-1.035) Blood: trace-intact   (Normal Range: Negative) pH: 6.0   (Normal Range: 5.0-8.0) Protein: trace   (Normal Range: Negative) Urobilinogen: 0.2   (Normal Range: 0-1) Nitrite: negative   (Normal Range: Negative) Leukocyte Esterace: negative   (Normal Range: Negative)  Urine Microscopic WBC/HPF: 0 RBC/HPF: 0 Bacteria/HPF: 0 Mucous/HPF: few Epithelial/HPF: 2-3 Crystals/HPF: few Casts/LPF: 0 Yeast/HPF: 0 Other: 0

## 2010-07-01 NOTE — Assessment & Plan Note (Signed)
Summary: 2 WK FU/NWS   Vital Signs:  Patient profile:   50 year old female Height:      66 inches (167.64 cm) Weight:      253.50 pounds (115.23 kg) BMI:     41.06 O2 Sat:      95 % on Room air Temp:     97.8 degrees F (36.56 degrees C) oral Pulse rate:   81 / minute Pulse rhythm:   regular BP sitting:   106 / 72  (left arm) Cuff size:   large  Vitals Entered By: Brenton Grills CMA Duncan Dull) (April 01, 2010 3:59 PM)  O2 Flow:  Room air CC: 2 week F/U/aj Is Patient Diabetic? Yes   Primary Provider:  [facility]  CC:  2 week F/U/aj.  History of Present Illness: pt is ill with acute bronchitis.  she is not on steriods, though.  she feels better, but still has a cough.  she brings a record of her cbg's which i have reviewed today.  she says she is willing to takre as many as 2 injections per day.  Current Medications (verified): 1)  Zoloft 100 Mg  Tabs (Sertraline Hcl) .... Take 1 Tablet By Mouth Two Times A Day 2)  Ambien 10 Mg  Tabs (Zolpidem Tartrate) .... Take One By Mouth At Bedtime 3)  Hydrochlorothiazide 25 Mg  Tabs (Hydrochlorothiazide) .... Take One By Mouth Daily 4)  Patanol   Soln (Olopatadine Hcl Soln) .... Use As Directed Prn 5)  Protonix 40 Mg  Tbec (Pantoprazole Sodium) .Marland Kitchen.. 1 By Mouth Every Day 6)  Ventolin Hfa 108 (90 Base) Mcg/act  Aers (Albuterol Sulfate) .... One To Two Inhalations Every 4 Hours As Needed For Shortness of Breath and Wheezing. 7)  Advair Diskus 100-50 Mcg/dose  Misc (Fluticasone-Salmeterol) .... One Inhalation Twice A Day As Needed. 8)  Flonase 50 Mcg/act  Susp (Fluticasone Propionate) .... 2 Sprays in Each Nostril Once Daily During Allergy Season 9)  Lantus 100 Unit/ml  Soln (Insulin Glargine) .... Inject 32 Units Daily At Suppertime 10)  Easy Touch Pen Needles 31g X 8 Mm Misc (Insulin Pen Needle) .... Use As Directed 11)  Bayer Breeze 2 Test  Disk (Glucose Blood) .... Check Blood Sugar Before Meals and At Bedtime 12)  Hydrocodone-Acetaminophen  5-500 Mg Tabs (Hydrocodone-Acetaminophen) .Marland Kitchen.. 1 -2 Tab By Mouth Up To Every 6 Hours Prn 13)  Klor-Con 10 10 Meq Cr-Tabs (Potassium Chloride) .... Take Two By Mouth Daily 14)  Nystatin 100000 Unit/gm Crea (Nystatin) .... Apply To Affected Area Two Times A Day As Needed 15)  Aspir-Low 81 Mg Tbec (Aspirin) .Marland Kitchen.. 1 By Mouth Once Daily With Food 16)  Amoxicillin 875 Mg Tabs (Amoxicillin) .... One By Mouth Two Times A Day X 10- Days 17)  Zofran 4 Mg Tabs (Ondansetron Hcl) .... One Every 4 Hours 18)  Lorazepam 1 Mg Tabs (Lorazepam) .... As Needed  Allergies (verified): 1)  ! * Omeprazole 2)  ! Lidocaine 3)  ! 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  Impression & Recommendations:  Problem # 1:  DIABETES MELLITUS, TYPE II (ICD-250.00) she needs some adjustment in her therapy two times a day insulin would better match her insulin to her changing requirements throughout the day  Medications Added to Medication List This Visit: 1)  Humalog Mix 75/25 Kwikpen 75-25 % Susp (Insulin lispro prot & lispro) .... 30 units with breakfast, and 20 units with the evening meal, and pen needles two times a day 2)  Benzonatate 200 Mg Caps (Benzonatate) .Marland Kitchen.. 1 tab three times a day as needed for cough  Other Orders: Est. Patient Level III (69629)  Patient Instructions: 1)  check your blood sugar 2 times a day.  vary the time of day when you check, between before the 3 meals, and at bedtime.  also check if you have symptoms of your blood  sugar being too high or too low.  please keep a record of the readings and bring it to your next appointment here.  please call us sooner if you are having low blood sugar episodes. 2)  change lantus to humalog 75/25, 30 units with breakfast, and 20 units with evening meal.  it is ok to use-up your lantus by taking 45 units once daily.   3)  Please schedule a follow-up appointment in 6 weeks. 4)  benzonatate 200 mg three times a day as needed for cough. Prescriptions: BENZONATATE 200 MG CAPS (BENZONATATE) 1 tab three times a day as needed for cough  #30 x 0   Entered and Authorized by:   Minus Breeding MD   Signed by:   Minus Breeding MD on 04/01/2010   Method used:   Electronically to        [facility]* (retail)       [address]       [place], [place]  [address]       Ph: [phone]       Fax: [phone]   RxID:   [id number] HUMALOG MIX 75/25 KWIKPEN 75-25 % SUSP (INSULIN LISPRO PROT & LISPRO) 30 units with breakfast, and 20 units with the evening meal, and pen needles two times a day  #1 box x 11   Entered and Authorized by:   Minus Breeding MD   Signed by:   Minus Breeding MD on 04/01/2010   Method used:   Print then Give to Patient   RxID:   [id number]    Orders Added: 1)  Est. Patient Level III [09323]

## 2010-07-01 NOTE — Assessment & Plan Note (Signed)
Summary: INSECT BITE ON BACK/JBB   Vital Signs:  Patient Profile:   50 Years Old Female CC:      Insect bite on her back / rwt Height:     66 inches (167.64 cm) Weight:      256 pounds BMI:     41.47 O2 Sat:      97 % O2 treatment:    Room Air Temp:     97.4 degrees F oral Pulse rate:   86 / minute Pulse rhythm:   regular Resp:     18 per minute BP sitting:   118 / 82  (left arm)  Pt. in pain?   yes    Location:   back    Intensity:   4    Type:       burning  Vitals Entered By: Levonne Spiller EMT-P (March 10, 2010 3:59 PM)              Is Patient Diabetic? Yes  Comments Prior smoker in 2003.      Current Allergies: ! * OMEPRAZOLE ! LIDOCAINE ! TRAMADOL HCL CODEINE SULFA DARVOCET GLUCOPHAGE * TRAZADONE * KLONIPIN * TEQUIN PAXILHistory of Present Illness Chief Complaint: Insect bite on her back / rwt History of Present Illness: Felt a bump right post shoulder while bathing yesterday. Scraped off insect parts/suspected tick with fingernails but no visible blood. Since then has had itching and burning at the site but no bleeding or discharge.  Dx last week at PCP with "sinus". Started amoxicillin 2-3 days ago. Is slightly better.  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) Hx of PSORIASIS (ICD-696.1) Hx of CYSTITIS, CHRONIC (ICD-595.2) Hx of HEMORRHOIDS (ICD-455.6) Hx of ALCOHOL ABUSE (ICD-305.00) HYPERTENSION (ICD-401.9) GERD (ICD-530.81) DIABETES MELLITUS, TYPE II (ICD-250.00) DEPRESSION (ICD-311) ASTHMA (ICD-493.90) ANXIETY (ICD-300.00) ALLERGIC RHINITIS (ICD-477.9)   Current Meds * ZOLOFT 100 MG  TABS (SERTRALINE HCL) Take 1 tablet by mouth two  times a day AMBIEN 10 MG  TABS (ZOLPIDEM TARTRATE) take one by mouth at bedtime HYDROCHLOROTHIAZIDE 25 MG  TABS (HYDROCHLOROTHIAZIDE) take one by mouth daily PATANOL   SOLN (OLOPATADINE HCL SOLN) use as directed prn PROTONIX 40 MG  TBEC (PANTOPRAZOLE SODIUM) 1 by mouth every day VENTOLIN HFA 108 (90 BASE) MCG/ACT  AERS (ALBUTEROL SULFATE) One to two inhalations every 4 hours as needed for shortness of breath and wheezing. ADVAIR DISKUS 100-50 MCG/DOSE  MISC (FLUTICASONE-SALMETEROL) One inhalation twice a day as needed. FLONASE 50 MCG/ACT  SUSP (FLUTICASONE PROPIONATE) 2 sprays in each nostril once daily during allergy season LANTUS 100 UNIT/ML  SOLN (INSULIN GLARGINE) inject 32 units daily at suppertime EASY TOUCH PEN NEEDLES 31G X 8 MM MISC (INSULIN PEN NEEDLE) use as directed BAYER BREEZE 2 TEST  DISK (GLUCOSE BLOOD) Check blood sugar before meals and at bedtime AMARYL 4 MG TABS (GLIMEPIRIDE) 1 by mouth once daily HYDROCODONE-ACETAMINOPHEN 5-500 MG TABS (HYDROCODONE-ACETAMINOPHEN) 1 -2 tab by mouth up to every 6 hours prn KLOR-CON 10 10 MEQ CR-TABS (POTASSIUM CHLORIDE) take two by mouth daily NYSTATIN 100000 UNIT/GM CREA (NYSTATIN) apply to affected area two times a day as needed * NOVOLOG PT NOT SURE IF 100 OR 70/30 sliding scale as needed ASPIR-LOW 81 MG TBEC (ASPIRIN) 1 by mouth once daily with food AMOXICILLIN 875 MG TABS (AMOXICILLIN) one by mouth two times a day  x 10- days ZOFRAN 4 MG TABS (ONDANSETRON HCL) one every 4 hours DOXYCYCLINE HYCLATE 100 MG CAPS (DOXYCYCLINE HYCLATE) 2 then 1 by mouth two times a day  REVIEW OF SYSTEMS Constitutional Symptoms      Denies fever and chills.  Eyes       Denies change in vision. Ear/Nose/Throat/Mouth       Complains of sinus problems.  Respiratory       Complains of productive cough, shortness of breath, and asthma.  Cardiovascular       Complains of murmurs.    Gastrointestinal       Complains of constipation and  indigestion. Musculoskeletal       Complains of gout.      Denies muscle pain and joint pain.  Skin       Complains of unusual moles/lumps or sores.  Psych       Complains of mood changes, temper/anger issues, anxiety/stress, and sleep problems.  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 Syndrome B Osteoarthritis, R knee, severe (arthroscopy proven) eczema - hands   psychiatrist -- Dr Koren Bound endocrine--Dr Kristopher Glee / Hyacinth Meeker  Past Surgical History: Last updated: 12/28/2008 Appendectomy Cholecystectomy Hysterectomy- partial Arthroscopy, R knee, partial menisectomy, chondroplasty 10/2008  Pelvic US- neg (12/2000) CT head- neg (04/2001) EGD- erythematous gastropathy (07/2002) Colonoscopy- int hemorrhoids (07/2002) 5/05 stress echo normal (Dr Gwen Pounds) Wrist fracture (07/2003) 2D Echo-mild MR, EF 55% (09/2003) Pelvic US- neg (07/2004) stress echo normal 06/30/07 with EF of 65 % Bells Palsy 4/09 (CT of head showed mild prominence of 3 ventricles- overall no acute change)  Family History: Last updated: 11/30/2006 Father:  Mother: ETOH, HTN Siblings:   Social History: Last updated: 10/17/2007 Marital Status: Married Children: 2 Occupation: Development worker, community at [employer]) husband is alcoholic who is emotionally abusive  Risk Factors: Smoking Status: quit (11/30/2006) Physical Exam General appearance: obese, well developed, well nourished, no acute distress Head: normocephalic, atraumatic Eyes: conjunctivae and lids normal Neck: neck supple, Extremities: normal extremities Neurological: grossly intact and non-focal Skin: papule left post shoulder 2cm of redness with 5 mm central bruise with puncture. 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 consider restarting after doxy course if sinus condition not cured. patient told of signs/sxs of rmsf, lyme, brown recluse spider bites. The patient was instructed to seek medical follow-up for worsening or new symptoms and if needed outside of our regular hours, seek attention at an Emergency Dept.  Follow Up: Follow up in 1-2 days if no improvement, Follow up on an as needed basis  The patient and/or caregiver has been counseled thoroughly with regard to medications prescribed including dosage, schedule, interactions, rationale for use, and possible side effects and they verbalize understanding.  Diagnoses and expected course  of recovery discussed and will return if not improved as expected or if the condition worsens. Patient and/or caregiver verbalized understanding.   PROCEDURE:   I & D Site: left shoulder  Size: 5 mm Anesthesia: none Procedure: betadine prep. puncture explored shallowly with 11 blade for insect parts. NONE Disposition: tolerated well. bandaged Prescriptions: DOXYCYCLINE HYCLATE 100 MG CAPS (DOXYCYCLINE HYCLATE) 2 then 1 by mouth two times a day  #21 x 0   Entered  and Authorized by:   Kelvin Cellar MD   Signed by:   Kelvin Cellar MD on 03/10/2010   Method used:   Telephoned to ...       [facility]* (retail)       [address]       [place], [place]  [address]       Ph: [phone]       Fax: [phone]   RxID:   [id number]   Patient Instructions: 1)  benadryl as needed.

## 2010-07-03 NOTE — Progress Notes (Signed)
Summary: form for diabetic supplies  Phone Note From Pharmacy   Caller: [facility] Summary of Call: Form for diabetic supplies is on your shelf.  I checked with pt and she does want these supplies.  She states she checks her blood sugar twice a day, but sometimes 3 times a day. Initial call taken by: Lowella Petties CMA, AAMA,  May 14, 2010 3:17 PM  Follow-up for Phone Call        form done and in nurse in box  Follow-up by: Judith Part MD,  May 14, 2010 3:39 PM  Additional Follow-up for Phone Call Additional follow up Details #1::        Completed form faxed to [phone] as instructed. Form sent for scanning.[name] LPN  May 15, 2010 9:23 AM

## 2010-07-03 NOTE — Progress Notes (Signed)
Summary: blood sugar still high  Phone Note Call from Patient Call back at Home Phone [phone]   Caller: Patient Call For: Judith Part MD Summary of Call: Pt made an appt with Dr. Everardo All for 1/05.  He is on vacation until then. Her blood sugar was 215 this morning.  Is there anything she should do. Initial call taken by: Lowella Petties CMA, AAMA,  May 28, 2010 9:31 AM  Follow-up for Phone Call        go ahead and increase her humalog mix from 50 yo 30 units in am and from 15 to 20 units in PM-- see how she does while waiting for follow up  try to stick to DM diet  let her know also there is a new DM education program starting up at [facility] -- and it will be free for the first few people that go... if she is interested I could call them to have it set up  let me know  Follow-up by: Judith Part MD,  May 28, 2010 11:35 AM  Additional Follow-up for Phone Call Additional follow up Details #1::        Patient notified as instructed by telephone. Pt is interested in the DM education program at [facility] and will wait to hear about details of when and where.[name] LPN  May 28, 2010 1:05 PM   I will call [facility] at [facility] and have them contact her    Additional Follow-up by: Judith Part MD,  May 28, 2010 1:23 PM

## 2010-07-03 NOTE — Assessment & Plan Note (Signed)
Summary: POST HOSP/NWS   #   Vital Signs:  Patient profile:   50 year old female Height:      66 inches (167.64 cm) Weight:      250.50 pounds (113.86 kg) BMI:     40.58 O2 Sat:      95 % on Room air Temp:     98.7 degrees F (37.06 degrees C) oral Pulse rate:   86 / minute Pulse rhythm:   regular BP sitting:   108 / 74  (right arm) Cuff size:   large  Vitals Entered By: Brenton Grills CMA Duncan Dull) (June 06, 2010 1:48 PM)  O2 Flow:  Room air CC: [facility] F/U/Elevated CBG's before holidays/aj, Back Pain Is Patient Diabetic? Yes   [facility] before [facility]/aj and Back Pain.  History of Present Illness: pt was seen in er, 2 days after starting premix insulin.  glucose was 400 then.  she brings a record of her cbg's which i have reviewed today.  it varies between 150 and 400, with no pattern throughout the day.    Current Medications (verified): 1)  Zoloft 100 Mg  Tabs (Sertraline Hcl) .... Take 1 Tablet By Mouth Two Times A Day 2)  Ambien 10 Mg  Tabs (Zolpidem Tartrate) .... Take One By Mouth At Bedtime 3)  Hydrochlorothiazide 25 Mg  Tabs (Hydrochlorothiazide) .... Take One By Mouth Daily 4)  Patanol   Soln (Olopatadine Hcl Soln) .... Use As Directed Prn 5)  Protonix 40 Mg  Tbec (Pantoprazole Sodium) .Marland Kitchen.. 1 By Mouth Every Day 6)  Ventolin Hfa 108 (90 Base) Mcg/act  Aers (Albuterol Sulfate) .... One To Two Inhalations Every 4 Hours As Needed For Shortness of Breath and Wheezing. 7)  Advair Diskus 100-50 Mcg/dose  Misc (Fluticasone-Salmeterol) .... One Inhalation Twice A Day As Needed. 8)  Flonase 50 Mcg/act  Susp (Fluticasone Propionate) .... 2 Sprays in Each Nostril Once Daily During Allergy Season 9)  Easy Touch Pen Needles 31g X 8 Mm Misc (Insulin Pen Needle) .... Use As Directed 10)  Bayer Breeze 2 Test  Disk (Glucose Blood) .... Check Blood Sugar Before Meals and At Bedtime 11)  Klor-Con 10 10 Meq Cr-Tabs (Potassium  Chloride) .... Take One By Mouth Daily 12)  Nystatin 100000 Unit/gm Crea (Nystatin) .... Apply To Affected Area Two Times A Day As Needed 13)  Aspir-Low 81 Mg Tbec (Aspirin) .Marland Kitchen.. 1 By Mouth Once Daily With Food 14)  Lorazepam 1 Mg Tabs (Lorazepam) .... As Needed 15)  Humalog Mix 75/25 Kwikpen 75-25 % Susp (Insulin Lispro Prot & Lispro) .... 25 Units With Breakfast, and 15 Units With The Evening Meal, and Pen Needles Two Times A Day 16)  Cefuroxime Axetil 250 Mg Tabs (Cefuroxime Axetil) .Marland Kitchen.. 1 Tab Two Times A Day 17)  Hydrocodone-Acetaminophen 10-650 Mg Tabs (Hydrocodone-Acetaminophen) .... 1/2-1 Tablet By Mouth Every 4-6 Hours As Needed For Pain  Allergies (verified): 1)  ! * Omeprazole 2)  ! Lidocaine 3)  ! 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    Physical Exam  General:  morbidly obese.  no distress Skin:  injection sites at anterior abdomen are without lesions.     Impression & Recommendations:  Problem # 1:  DIABETES MELLITUS, TYPE II (ICD-250.00) she needs a simpler regimen.  however, the pattern of her cbg's indicates the lantus is too long-acting of an insulin for her.    Medications Added to Medication List This Visit: 1)  Hydrocodone-acetaminophen 10-650 Mg Tabs (Hydrocodone-acetaminophen) .... 1/2-1 tablet by mouth every 4-6 hours as needed for pain 2)  Levemir Flexpen 100 Unit/ml Soln (Insulin detemir) .... 50 units each am, and pen needles once daily  Other Orders: Est. Patient Level III  (16109)   Patient Instructions: 1)  change current insulin to levemir, 50 units each am. 2)  return 2 weeks.   3)  call sooner if blood sugar is low, or stays over 200.   Prescriptions: LEVEMIR FLEXPEN 100 UNIT/ML SOLN (INSULIN DETEMIR) 50 units each am, and pen needles once daily  #1 box x 11   Entered and Authorized by:   Minus Breeding MD   Signed by:   Minus Breeding MD on 06/06/2010   Method used:   Electronically to        [facility]* (retail)       [address]       [place], [place]  [address]       Ph: [phone]       Fax: [phone]   RxID:   [id number]    Orders Added: 1)  Est. Patient Level III [44010]

## 2010-07-03 NOTE — Progress Notes (Signed)
Summary: blood sugars are elevated  Phone Note Call from Patient Call back at Home Phone [phone]   Caller: Patient Call For: Judith Part MD Summary of Call: Pt reports that her blood sugar has been running high, went to [place] on friday.  Sugars are still high, she cant get in touch with anyone at Dr. George Hugh office.  Her last glucose check at 12 today was 178.  She is asking if she can go back on novolog.  She says her numbers have been going up since she was changed from novolog and lantus to just  humolog  Initial call taken by: Lowella Petties CMA, AAMA,  May 27, 2010 12:49 PM  Follow-up for Phone Call        let her know I will go ahead and foward this to Dr Everardo All -- and let me know towards end of day if she has not heard back- thanks Follow-up by: Judith Part MD,  May 27, 2010 1:23 PM  Additional Follow-up for Phone Call Additional follow up Details #1::        Patient notified as instructed by telephone.  Pt has not heaard from Dr George Hugh office. Pt said she took her BS few minutes ago before she ate. BS was 151 and pt just ate one hot dog and some chips. Pt said she feels better than Fri (pt states was out of her head on Fri) but her head feels weird. She cannot describe more than that. Please advise. [name] LPN  May 27, 2010 5:21 PM     Additional Follow-up for Phone Call Additional follow up Details #2::    I'm glad she is feeling better now  try to stick to a diabetic diet best you can  ask pt to call [facility] again in the am to leave a message and alert me if no success alert [place] earlier if sugar gets very high again  Follow-up by: Judith Part MD,  May 27, 2010 5:28 PM  Additional Follow-up for Phone Call Additional follow up Details #3:: Details for Additional Follow-up Action Taken: Patient notified as instructed by telephone. Pt has some Novolog insulin at home. Pt wants to know if she should take Novolog if sugar goes high  again. Pt taking Humalog mix75/25 Pt takes 25units at breakfast and 15 units at evening meal, and pen needle two times a day. Please advise. [name] LPN  May 27, 2010 5:33 PM   Dr Milinda Antis said she cannot advise pt to take Novolog and needs to ck with [facility] in the AM. Pt took the Humalog 75/25  (15units at supper today.[name] LPN  May 27, 2010 5:45 PM

## 2010-07-03 NOTE — Progress Notes (Signed)
Summary: would like phone call   Phone Note Call from Patient Call back at Home Phone [phone]   Caller: Patient Call For: Alexa Part MD Summary of Call: Patient had surgery on her shoulder last week and she says that since then the tip of her tongue has been feeling numb and she has had alot of heartburn. She is not having any other symptoms or problems other than just being in alot of pain from the surgery. She has an appt w/ [facility] tomorrow and is going to let them know about her tongue and heart burn. She would like to also talk with you about this and is asking if you could call her at your convenience.  Initial call taken by: [name],  May 22, 2010 10:26 AM  Follow-up for Phone Call        for the heartburn I would like her to increase her protonix 40 mg two times a day and let me know after her ortho follow up how she is doing next week  Follow-up by: Alexa Part MD,  May 22, 2010 12:04 PM  Additional Follow-up for Phone Call Additional follow up Details #1::        Patient notified as instructed by telephone. [name] LPN  May 22, 2010 12:51 PM

## 2010-07-03 NOTE — Assessment & Plan Note (Signed)
Summary: 2 WK ROV /NWS  #   Vital Signs:  Patient profile:   50 year old female Height:      66 inches (167.64 cm) Weight:      2487 pounds (1130.45 kg) BMI:     402.86 O2 Sat:      98 % on Room air Temp:     98.6 degrees F (37.00 degrees C) oral Pulse rate:   84 / minute Pulse rhythm:   regular BP sitting:   102 / 72  (left arm) Cuff size:   large  Vitals Entered By: Brenton Grills CMA Duncan Dull) (June 20, 2010 3:57 PM)  O2 Flow:  Room air CC: 2 week F/U/aj Is Patient Diabetic? Yes   Primary Provider:  [facility]  CC:  2 week F/U/aj.  History of Present Illness: pt states she feels well in general.  she brings a record of her cbg's which i have reviewed today.  it varies from low-100's (afternoon) to 250 (hs).  it is mid-100's in am.  she has lost a few lbs, due to her efforts. pt has few days of slight soreness in the throat, nasal congestion, and slight assoc earache.  she has had multiple abx over the past few mos.   Current Medications (verified): 1)  Zoloft 100 Mg  Tabs (Sertraline Hcl) .... Take 1 Tablet By Mouth Two Times A Day 2)  Ambien 10 Mg  Tabs (Zolpidem Tartrate) .... Take One By Mouth At Bedtime 3)  Hydrochlorothiazide 25 Mg  Tabs (Hydrochlorothiazide) .... Take One By Mouth Daily 4)  Patanol   Soln (Olopatadine Hcl Soln) .... Use As Directed Prn 5)  Protonix 40 Mg  Tbec (Pantoprazole Sodium) .Marland Kitchen.. 1 By Mouth Every Day 6)  Ventolin Hfa 108 (90 Base) Mcg/act  Aers (Albuterol Sulfate) .... One To Two Inhalations Every 4 Hours As Needed For Shortness of Breath and Wheezing. 7)  Advair Diskus 100-50 Mcg/dose  Misc (Fluticasone-Salmeterol) .... One Inhalation Twice A Day As Needed. 8)  Flonase 50 Mcg/act  Susp (Fluticasone Propionate) .... 2 Sprays in Each Nostril Once Daily During Allergy Season 9)  Easy Touch Pen Needles 31g X 8 Mm Misc (Insulin Pen Needle) .... Use As Directed 10)  Bayer Breeze 2 Test  Disk (Glucose Blood) .... Check Blood Sugar Before Meals and At  Bedtime 11)  Klor-Con 10 10 Meq Cr-Tabs (Potassium Chloride) .... Take One By Mouth Daily 12)  Nystatin 100000 Unit/gm Crea (Nystatin) .... Apply To Affected Area Two Times A Day As Needed 13)  Aspir-Low 81 Mg Tbec (Aspirin) .Marland Kitchen.. 1 By Mouth Once Daily With Food 14)  Lorazepam 1 Mg Tabs (Lorazepam) .... As Needed 15)  Cefuroxime Axetil 250 Mg Tabs (Cefuroxime Axetil) .Marland Kitchen.. 1 Tab Two Times A Day 16)  Hydrocodone-Acetaminophen 10-650 Mg Tabs (Hydrocodone-Acetaminophen) .... 1/2-1 Tablet By Mouth Every 4-6 Hours As Needed For Pain 17)  Levemir Flexpen 100 Unit/ml Soln (Insulin Detemir) .... 50 Units Each Am, and Pen Needles Once Daily  Allergies (verified): 1)  ! * Omeprazole 2)  ! Lidocaine 3)  ! 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         she has a slight cough  Physical Exam  General:  morbidly obese.  no distress Head:  head: no deformity eyes: no periorbital swelling, no proptosis external nose and ears are normal mouth: no lesion seen Ears:  TM's intact and clear with normal canals with grossly normal hearing.     Impression & Recommendations:  Problem # 1:  DIABETES MELLITUS, TYPE II (ICD-250.00) the patteren of her cbg's indicates she needs more insulin durin the day, and less at night.  Problem # 2:  uri vs allergic rhinitis  Medications Added to Medication List This Visit: 1)  Levemir Flexpen 100 Unit/ml Soln (Insulin detemir) .... 45 units each am, and pen needles two times a day. 2)  Novolog  Flexpen 100 Unit/ml Soln (Insulin aspart) .... 5 units once daily, with the evening meal  Other Orders: Est. Patient Level IV (16109)  Patient Instructions: 1)  decrease levemir to 45 units each am.   2)  add novolog 5 units with the evening meal. 3)  return 2-3 weeks.   4)  call sooner if blood sugar is low.  5)  try loratadine-d (non-prescription) as needed for congestion. Prescriptions: NOVOLOG FLEXPEN 100 UNIT/ML SOLN (INSULIN ASPART) 5 units once daily, with the evening meal  #1 box x 11   Entered and Authorized by:   Minus Breeding MD   Signed by:   Minus Breeding MD on 06/20/2010   Method used:   Electronically to        [facility]* (retail)       [address]       [place], [place]  [address]       Ph: [phone]       Fax: [phone]   RxID:   [id number]    Orders Added: 1)  Est. Patient Level IV [44010]

## 2010-07-03 NOTE — Progress Notes (Signed)
Summary: Call Report/ SAE  Phone Note Other Incoming   Caller: [facility] Summary of Call: [facility] Triage Call Report Triage Record Num: [id number] Operator: Arline Asp Loftin Patient Name: Alexa Woods Call Date & Time: 05/24/2010 1:32:23PM Patient Phone: [phone] PCP: Romero Belling Patient Gender: Female PCP Fax : [phone] Patient DOB: [birth year] Practice Name: [facility] Reason for Call: Pt's BG of on 12/24 at 1225 of 240, ate lunch, and took 30 units of Humalog, and at 1330 BG of 220. Also feeling extreme thirst and weakness. RN has scheduled a call back to assess BG on 12/24 at 1450. FOLLOW UP CALL: BG on 12/24 at 1450 of 211. Advised to be seen within 24h for sx of hyperglycemia. Protocol(s) Used: Diabetes: Control Problems Recommended Outcome per Protocol: See Provider within 24 hours Reason for Outcome: Symptoms of early hyperglycemia (unusually thirsty, frequent urination, feeling tired or weak) Care Advice: Go to ED immediately if blood sugar is 300 mg/dl or more AND symptoms are present including: large urine ketones, rapid, deep breathing, fruity breath, nausea, vomiting or abdominal pain; confused thinking, dry, flushed skin; extreme thirst, extreme fatigue or weakness; or frequent urination.  ~  ~ Check blood sugar and ketones before calling provider Call provider within 24 hours if blood sugar is 250 mg/dl two times in a row, or if most blood sugars are 250 mg/dl for more than 3 days. Call provider immediately if urine ketones are large (3+ to 4+).  ~ 12/ Initial call taken by: Margaret Pyle, CMA,  May 27, 2010 11:01 AM

## 2010-07-03 NOTE — Medication Information (Signed)
Summary: Diabetes Supplies/Physician Preferred Pharmacy  Diabetes Supplies/Physician Preferred Pharmacy   Imported By: Lanelle Bal 05/23/2010 11:58:27  _____________________________________________________________________  External Attachment:    Type:   Image     Comment:   External Document
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
## 2010-07-11 ENCOUNTER — Ambulatory Visit (INDEPENDENT_AMBULATORY_CARE_PROVIDER_SITE_OTHER): Payer: PRIVATE HEALTH INSURANCE | Admitting: Endocrinology

## 2010-07-11 ENCOUNTER — Encounter: Payer: Self-pay | Admitting: Endocrinology

## 2010-07-11 ENCOUNTER — Other Ambulatory Visit: Payer: Self-pay | Admitting: Endocrinology

## 2010-07-11 DIAGNOSIS — E119 Type 2 diabetes mellitus without complications: Secondary | ICD-10-CM

## 2010-07-11 LAB — HEMOGLOBIN A1C: Hgb A1c MFr Bld: 9.5 % — ABNORMAL HIGH (ref 4.6–6.5)

## 2010-07-11 LAB — CONVERTED CEMR LAB: Fructosamine: 364 micromoles/L — ABNORMAL HIGH (ref <285)

## 2010-07-15 ENCOUNTER — Telehealth: Payer: Self-pay | Admitting: Family Medicine

## 2010-07-17 NOTE — Assessment & Plan Note (Signed)
Summary: 2-3 wk f/u  STC   Vital Signs:  Patient profile:   50 year old female Height:      66 inches (167.64 cm) Weight:      246.50 pounds (112.05 kg) BMI:     39.93 O2 Sat:      95 % on Room air Temp:     98.3 degrees F (36.83 degrees C) oral Pulse rate:   83 / minute Pulse rhythm:   regular BP sitting:   112 / 72  (left arm) Cuff size:   large  Vitals Entered By: Brenton Grills CMA Duncan Dull) (July 11, 2010 4:04 PM)  O2 Flow:  Room air CC: 2-3 week F/U/aj Is Patient Diabetic? Yes   Primary Provider:  [facility]  CC:  2-3 week F/U/aj.  History of Present Illness: pt states she feels well in general.   she likes the new insulin regimen.  she has no hypoglycemia.  she brings a record of her cbg's which i have reviewed today.  almost all are in the 100's.  Current Medications (verified): 1)  Zoloft 100 Mg  Tabs (Sertraline Hcl) .... Take 1 Tablet By Mouth Two Times A Day 2)  Ambien 10 Mg  Tabs (Zolpidem Tartrate) .... Take One By Mouth At Bedtime 3)  Hydrochlorothiazide 25 Mg  Tabs (Hydrochlorothiazide) .... Take One By Mouth Daily 4)  Patanol   Soln (Olopatadine Hcl Soln) .... Use As Directed Prn 5)  Protonix 40 Mg  Tbec (Pantoprazole Sodium) .Marland Kitchen.. 1 By Mouth Every Day 6)  Ventolin Hfa 108 (90 Base) Mcg/act  Aers (Albuterol Sulfate) .... One To Two Inhalations Every 4 Hours As Needed For Shortness of Breath and Wheezing. 7)  Advair Diskus 100-50 Mcg/dose  Misc (Fluticasone-Salmeterol) .... One Inhalation Twice A Day As Needed. 8)  Flonase 50 Mcg/act  Susp (Fluticasone Propionate) .... 2 Sprays in Each Nostril Once Daily During Allergy Season 9)  Easy Touch Pen Needles 31g X 8 Mm Misc (Insulin Pen Needle) .... Use As Directed 10)  Bayer Breeze 2 Test  Disk (Glucose Blood) .... Check Blood Sugar Before Meals and At Bedtime 11)  Klor-Con 10 10 Meq Cr-Tabs (Potassium Chloride) .... Take One By Mouth Daily 12)  Nystatin 100000 Unit/gm Crea (Nystatin) .... Apply To Affected Area Two  Times A Day As Needed 13)  Aspir-Low 81 Mg Tbec (Aspirin) .Marland Kitchen.. 1 By Mouth Once Daily With Food 14)  Lorazepam 1 Mg Tabs (Lorazepam) .... As Needed 15)  Hydrocodone-Acetaminophen 10-650 Mg Tabs (Hydrocodone-Acetaminophen) .... 1/2-1 Tablet By Mouth Every 4-6 Hours As Needed For Pain 16)  Levemir Flexpen 100 Unit/ml Soln (Insulin Detemir) .... 45 Units Each Am, and Pen Needles Two Times A Day. 17)  Novolog Flexpen 100 Unit/ml Soln (Insulin Aspart) .... 5 Units Once Daily, With The Evening Meal  Allergies (verified): 1)  ! * Omeprazole 2)  ! Lidocaine 3)  ! 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    Physical Exam  General:  morbidly obese.  no distress  Psych:  Alert and cooperative; normal mood and affect; normal attention span and concentration.   Additional Exam:  Hemoglobin A1C       [H]  9.5 %  Fructosamine         [H]  364 umol/L  (converts to a1c of 7.7)   Impression & Recommendations:  Problem # 1:  DIABETES MELLITUS, TYPE II (ICD-250.00) Assessment Improved  Medications Added to Medication List This Visit: 1)  Patanol 0.1 % Soln (Olopatadine hcl) .... 2 dropr each eye three times a day as needed allergy symptoms  Other Orders: TLB-A1C / Hgb A1C (Glycohemoglobin) (83036-A1C) T-Fructosamine ([address]) Est. Patient Level III (09811)  Patient Instructions: 1)  blood tests are being ordered for you today.  please call [phone] to hear your test results. 2)  pending the test results, please  continue the same insulin for now. 3)  Please schedule a follow-up appointment in 3 months. 4)  check your blood sugar 2 times a day.  vary the time of day when you check, between before the 3 meals, and at bedtime.  also check if you have symptoms of your blood sugar being too high or too low.  please keep a record of the readings and bring it to your next appointment here.  please call us sooner if you are having low blood sugar episodes. 5)  (update: i left message on phone-tree:  rx as we discussed) Prescriptions: PATANOL 0.1 % SOLN (OLOPATADINE HCL) 2 dropr each eye three times a day as needed allergy symptoms  #1  bottle x 2   Entered and Authorized by:   Minus Breeding MD   Signed by:   Minus Breeding MD on 07/11/2010   Method used:   Electronically to        [facility]* (retail)       [address]       [place], [place]  [address]       Ph: [phone]       Fax: [phone]   RxID:   [id number]    Orders Added: 1)  TLB-A1C / Hgb A1C (Glycohemoglobin) [83036-A1C] 2)  T-Fructosamine [[address]387] 3)  Est. Patient Level III [[address]]

## 2010-07-22 ENCOUNTER — Ambulatory Visit (INDEPENDENT_AMBULATORY_CARE_PROVIDER_SITE_OTHER): Payer: PRIVATE HEALTH INSURANCE | Admitting: Family Medicine

## 2010-07-22 ENCOUNTER — Encounter: Payer: Self-pay | Admitting: Family Medicine

## 2010-07-22 DIAGNOSIS — M549 Dorsalgia, unspecified: Secondary | ICD-10-CM

## 2010-07-22 DIAGNOSIS — M545 Low back pain: Secondary | ICD-10-CM | POA: Insufficient documentation

## 2010-07-23 NOTE — Progress Notes (Signed)
Summary: needs something for hemorrhoids  Phone Note Call from Patient Call back at Home Phone [phone]   Caller: Patient Call For: Judith Part MD Summary of Call: Pt is having a problem with hemorrhoid flare up and is asking if something can be called to [facility].  She has swelling, itching, burning, pain. Initial call taken by: Lowella Petties CMA, AAMA,  July 15, 2010 4:43 PM  Follow-up for Phone Call        we have tried anusol hc supp in past - can try the cream f/u if not imp px written on EMR for call in  Follow-up by: Judith Part MD,  July 15, 2010 5:21 PM  Additional Follow-up for Phone Call Additional follow up Details #1::        Patient notified as instructed by telephone. Medication phoned to [facility] pharmacy as instructed. [name] LPN  July 15, 2010 5:29 PM     New/Updated Medications: ANUSOL-HC 2.5 % CREA (HYDROCORTISONE) apply to affected area / hemorroids once daily as needed  no more than 14 days in a row Prescriptions: ANUSOL-HC 2.5 % CREA (HYDROCORTISONE) apply to affected area / hemorroids once daily as needed  no more than 14 days in a row  #1 small x 0   Entered and Authorized by:   Judith Part MD   Signed by:   Judith Part MD on 07/15/2010   Method used:   Telephoned to ...       [facility]* (retail)       [address]       [place], [place]  [address]       Ph: [phone]       Fax: [phone]   RxID:   [id number]

## 2010-07-24 ENCOUNTER — Telehealth: Payer: Self-pay | Admitting: Endocrinology

## 2010-07-28 ENCOUNTER — Encounter: Payer: Self-pay | Admitting: Family Medicine

## 2010-07-29 NOTE — Letter (Signed)
Summary: Out of Work  [facility] [employer] [facility]  [address] [place], [place] [address]   Phone: [phone]  Fax: [phone]    July 22, 2010   Employee:  Alexa Woods    To Whom It May Concern:   For Medical reasons, please excuse the above named employee from work for physical therapy appointments.  If you need additional information, please feel free to contact our office.         Sincerely,    Crawford Givens MD

## 2010-07-29 NOTE — Assessment & Plan Note (Signed)
Summary: BACK PAIN/CLE   MEDCOST   Vital Signs:  Patient profile:   50 year old female Weight:      248.75 pounds Temp:     98.5 degrees F oral Pulse rate:   84 / minute Pulse rhythm:   regular BP sitting:   120 / 78  (left arm) Cuff size:   large  Vitals Entered By: Sydell Axon LPN (July 22, 2010 2:35 PM) CC: Back pain, ? pulled a muscle   History of Present Illness: Back pain.  "I don't know what happened."   Usually with L lower back pain, but now on R side.  It feels like a muscle knot the patient.  Sx started Sunday.  Did some house cleaning/furniture moving around the time the symptoms started.  No trauma o/w.  No FCNAV.  R leg aches.  Occ radicular symptoms in R leg.  Pain laying down, sitting.    Sugar was 260 last night, had been 189 this AM.  has had follow up with endo.   Allergies: 1)  ! * Omeprazole 2)  ! Lidocaine 3)  ! 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 Syndrome B Osteoarthritis, R knee, severe (arthroscopy proven) eczema - hands   psychiatrist -- Dr Koren Bound endocrine--Dr Kristopher Glee / Hyacinth Meeker  Past Surgical History: Appendectomy Cholecystectomy Hysterectomy- partial Arthroscopy, R knee, partial menisectomy, chondroplasty 10/2008 R knee replacement Pelvic US- neg (12/2000) CT head- neg (04/2001) EGD- erythematous gastropathy (07/2002) Colonoscopy- int hemorrhoids (07/2002) 5/05 stress echo normal (Dr Gwen Pounds) Wrist fracture (07/2003) 2D Echo-mild MR, EF 55% (09/2003) Pelvic US- neg (07/2004) stress echo normal 06/30/07 with EF of 65 % Bells Palsy 4/09 (CT of head showed mild prominence of 3 ventricles- overall no acute change)  Review of Systems       See HPI.   Otherwise negative.    Physical Exam  General:  no apparent distress normocephalic atraumatic regular rate and rhythm clear to auscultation bilaterally back w/o midline pain  R lower lumbar paraspinal muscles tender to palpation, near waistline Painful with waist flex and ext SLR with slight radicular symptoms on R leg.  Normal ROM at the hips o/w.  R knee replacement scar noted.  decrease in R patellar dtr but normal on L.  sym ankle jerk.  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  She has a stable neuro exam, given the prev neuropathy and joint replacement.  She shows no sign of acute neurologic involvement.  I would treat the pain with vicodin and have her follow up with physical therapy.  Sedation caution given for the medicine and she understands.  Ice and heat in meantime as needed for back and follow up as needed.  She agrees to call back as needed.   Her updated medication list for this problem includes:    Aspir-low 81 Mg Tbec (Aspirin) .Marland Kitchen... 1 by mouth once daily with food    Hydrocodone-acetaminophen 5-325 Mg Tabs (Hydrocodone-acetaminophen) .Marland Kitchen... 1-2 by mouth three times a day as needed for back pain  Orders: Physical Therapy Referral (  PT)  Complete Medication List: 1)  Zoloft 100 Mg Tabs (sertraline Hcl)  .... Take 1 tablet by mouth two times a day 2)  Ambien 10 Mg Tabs (Zolpidem tartrate) .... Take one by mouth at bedtime 3)  Hydrochlorothiazide 25 Mg Tabs (Hydrochlorothiazide) .... Take one by mouth daily 4)  Protonix 40 Mg Tbec (Pantoprazole sodium) .Marland Kitchen.. 1 by mouth every day 5)  Ventolin Hfa 108 (90 Base) Mcg/act Aers (Albuterol sulfate) .... One to two inhalations every 4 hours as needed for shortness of breath and wheezing. 6)  Advair Diskus 100-50 Mcg/dose Misc (Fluticasone-salmeterol)  .... One inhalation twice a day as needed. 7)  Flonase 50 Mcg/act Susp (Fluticasone propionate) .... 2 sprays in each nostril once daily during allergy season 8)  Easy Touch Pen Needles 31g X 8 Mm Misc (Insulin pen needle) .... Use as directed 9)  Bayer Breeze 2 Test Disk (Glucose blood) .... Check blood sugar before meals and at bedtime 10)  Klor-con 10 10 Meq Cr-tabs (Potassium chloride) .... Take one by mouth daily 11)  Nystatin 100000 Unit/gm Crea (Nystatin) .... Apply to affected area two times a day as needed 12)  Aspir-low 81 Mg Tbec (Aspirin) .Marland Kitchen.. 1 by mouth once daily with food 13)  Lorazepam 1 Mg Tabs (Lorazepam) .... As needed 14)  Hydrocodone-acetaminophen 5-325 Mg Tabs (Hydrocodone-acetaminophen) .Marland Kitchen.. 1-2 by mouth three times a day as needed for back pain 15)  Levemir Flexpen 100 Unit/ml Soln (Insulin detemir) .... 45 units each am, and pen needles two times a day. 16)  Novolog Flexpen 100 Unit/ml Soln (Insulin aspart) .... 5 units once daily, with the evening meal 17)  Patanol 0.1 % Soln (Olopatadine hcl) .... 2 dropr each eye three times a day as needed allergy symptoms 18)  Anusol-hc 2.5 % Crea (Hydrocortisone) .... Apply to affected area / hemorroids once daily as needed  no more than 14 days in a row  Patient Instructions: 1)  See Shirlee Limerick about your referral before your leave today.  Use the pain meds as needed and go to physical therapy.  Let us know if you aren't improving.  Prescriptions: HYDROCODONE-ACETAMINOPHEN 5-325 MG TABS (HYDROCODONE-ACETAMINOPHEN) 1-2 by mouth three times a day as needed for back pain  #30 x 1   Entered and Authorized by:   Crawford Givens MD   Signed by:   Crawford Givens MD on 07/22/2010   Method used:   Print then Give to Patient   RxID:   [id number]    Orders Added: 1)  Est. Patient Level III [[address]] 2)  Physical Therapy Referral [PT]    Current Allergies (reviewed today): ! * OMEPRAZOLE ! LIDOCAINE ! TRAMADOL  HCL CODEINE SULFA DARVOCET GLUCOPHAGE * TRAZADONE * KLONIPIN * TEQUIN PAXIL

## 2010-07-29 NOTE — Progress Notes (Signed)
Summary: rx refill req  Phone Note From Pharmacy   Caller: [facility]* Summary of Call: Pt would like new rx for test strips, lancets, and glucometer to go to [facility] Initial call taken by: Brenton Grills CMA Duncan Dull),  July 24, 2010 4:41 PM    New/Updated Medications: ONETOUCH ULTRA 2 W/DEVICE KIT (BLOOD GLUCOSE MONITORING SUPPL) use as directed ONETOUCH ULTRASOFT LANCETS  MISC (LANCETS) use as directed dx 250.00 ONETOUCH ULTRA BLUE  STRP (GLUCOSE BLOOD) check blood sugar two times daily dx 250.00 Prescriptions: ONETOUCH ULTRA BLUE  STRP (GLUCOSE BLOOD) check blood sugar two times daily dx 250.00  #100 x 5   Entered by:   Brenton Grills CMA (AAMA)   Authorized by:   Minus Breeding MD   Signed by:   Brenton Grills CMA (AAMA) on 07/24/2010   Method used:   Electronically to        [facility]* (retail)       [address]       [place], [place]  [address]       Ph: [phone]       Fax: [phone]   RxID:   [id number] ONETOUCH ULTRASOFT LANCETS  MISC (LANCETS) use as directed dx 250.00  #100 x 5   Entered by:   Brenton Grills CMA (AAMA)   Authorized by:   Minus Breeding MD   Signed by:   Brenton Grills CMA (AAMA) on 07/24/2010   Method used:   Electronically to        [facility]* (retail)       [address]       [place], [place]  [address]       Ph: [phone]       Fax: [phone]   RxID:   [id number] ONETOUCH ULTRA 2 W/DEVICE KIT (BLOOD GLUCOSE MONITORING SUPPL) use as directed  #1 x 0   Entered by:   Brenton Grills CMA (AAMA)   Authorized by:   Minus Breeding MD   Signed by:   Brenton Grills CMA (AAMA) on 07/24/2010   Method used:   Electronically to        [facility]* (retail)       [address]       [place], [place]  [address]       Ph: [phone]       Fax: [phone]   RxID:   [id number]

## 2010-08-07 ENCOUNTER — Encounter (INDEPENDENT_AMBULATORY_CARE_PROVIDER_SITE_OTHER): Payer: Self-pay | Admitting: *Deleted

## 2010-08-07 ENCOUNTER — Encounter: Payer: Self-pay | Admitting: Gastroenterology

## 2010-08-07 ENCOUNTER — Other Ambulatory Visit: Payer: PRIVATE HEALTH INSURANCE

## 2010-08-07 ENCOUNTER — Other Ambulatory Visit: Payer: Self-pay | Admitting: Gastroenterology

## 2010-08-07 ENCOUNTER — Ambulatory Visit (INDEPENDENT_AMBULATORY_CARE_PROVIDER_SITE_OTHER): Payer: PRIVATE HEALTH INSURANCE | Admitting: Gastroenterology

## 2010-08-07 DIAGNOSIS — R1013 Epigastric pain: Secondary | ICD-10-CM

## 2010-08-07 DIAGNOSIS — K219 Gastro-esophageal reflux disease without esophagitis: Secondary | ICD-10-CM

## 2010-08-07 DIAGNOSIS — R1310 Dysphagia, unspecified: Secondary | ICD-10-CM

## 2010-08-07 DIAGNOSIS — E119 Type 2 diabetes mellitus without complications: Secondary | ICD-10-CM

## 2010-08-07 LAB — IBC PANEL: Transferrin: 261.1 mg/dL (ref 212.0–360.0)

## 2010-08-07 LAB — BASIC METABOLIC PANEL
BUN: 15 mg/dL (ref 6–23)
CO2: 28 mEq/L (ref 19–32)
Chloride: 98 mEq/L (ref 96–112)
Creatinine, Ser: 0.7 mg/dL (ref 0.4–1.2)

## 2010-08-07 LAB — PROTIME-INR: Prothrombin Time: 11.4 s (ref 10.2–12.4)

## 2010-08-07 LAB — HEPATIC FUNCTION PANEL
ALT: 36 U/L — ABNORMAL HIGH (ref 0–35)
Bilirubin, Direct: 0.1 mg/dL (ref 0.0–0.3)
Total Protein: 7.4 g/dL (ref 6.0–8.3)

## 2010-08-07 LAB — CBC WITH DIFFERENTIAL/PLATELET
Basophils Absolute: 0 10*3/uL (ref 0.0–0.1)
Basophils Relative: 0.5 % (ref 0.0–3.0)
Hemoglobin: 13.5 g/dL (ref 12.0–15.0)
Lymphocytes Relative: 24.4 % (ref 12.0–46.0)
Monocytes Relative: 5.8 % (ref 3.0–12.0)
Neutro Abs: 5.2 10*3/uL (ref 1.4–7.7)
RBC: 4.72 Mil/uL (ref 3.87–5.11)
WBC: 7.6 10*3/uL (ref 4.5–10.5)

## 2010-08-07 LAB — HEMOGLOBIN A1C: Hgb A1c MFr Bld: 9.5 % — ABNORMAL HIGH (ref 4.6–6.5)

## 2010-08-07 LAB — AMMONIA: Ammonia: 19 umol/L (ref 11–35)

## 2010-08-07 LAB — MAGNESIUM: Magnesium: 1.9 mg/dL (ref 1.5–2.5)

## 2010-08-07 LAB — VITAMIN B12: Vitamin B-12: 607 pg/mL (ref 211–911)

## 2010-08-11 ENCOUNTER — Telehealth: Payer: Self-pay | Admitting: Gastroenterology

## 2010-08-11 LAB — URINALYSIS, ROUTINE W REFLEX MICROSCOPIC
Glucose, UA: >1000 mg/dL — AB
Hgb urine dipstick: NEGATIVE
Leukocytes, UA: NEGATIVE
Protein, ur: NEGATIVE mg/dL
Specific Gravity, Urine: 1.026 (ref 1.005–1.030)
pH: 6.5 (ref 5.0–8.0)

## 2010-08-11 LAB — COMPREHENSIVE METABOLIC PANEL
ALT: 28 U/L (ref 0–35)
BUN: 7 mg/dL (ref 6–23)
Calcium: 8.8 mg/dL (ref 8.4–10.5)
Glucose, Bld: 407 mg/dL — ABNORMAL HIGH (ref 70–99)
Sodium: 129 mEq/L — ABNORMAL LOW (ref 135–145)
Total Protein: 7.5 g/dL (ref 6.0–8.3)

## 2010-08-11 LAB — DIFFERENTIAL
Lymphs Abs: 1.7 10*3/uL (ref 0.7–4.0)
Monocytes Relative: 7 % (ref 3–12)
Neutro Abs: 6.6 10*3/uL (ref 1.7–7.7)
Neutrophils Relative %: 73 % (ref 43–77)

## 2010-08-11 LAB — CBC
HCT: 38.9 % (ref 36.0–46.0)
Hemoglobin: 13.3 g/dL (ref 12.0–15.0)
MCH: 27.8 pg (ref 26.0–34.0)
MCHC: 34.2 g/dL (ref 30.0–36.0)
RBC: 4.79 MIL/uL (ref 3.87–5.11)

## 2010-08-11 LAB — GLUCOSE, CAPILLARY
Glucose-Capillary: 310 mg/dL — ABNORMAL HIGH (ref 70–99)
Glucose-Capillary: 361 mg/dL — ABNORMAL HIGH (ref 70–99)
Glucose-Capillary: 378 mg/dL — ABNORMAL HIGH (ref 70–99)
Glucose-Capillary: 94 mg/dL (ref 70–99)
Glucose-Capillary: 99 mg/dL (ref 70–99)

## 2010-08-11 LAB — URINE MICROSCOPIC-ADD ON

## 2010-08-12 ENCOUNTER — Encounter (HOSPITAL_COMMUNITY)
Admission: RE | Admit: 2010-08-12 | Discharge: 2010-08-12 | Disposition: A | Discharge status: Home / Self Care | Payer: PRIVATE HEALTH INSURANCE | Source: Ambulatory Visit | Attending: Gastroenterology | Admitting: Gastroenterology

## 2010-08-12 ENCOUNTER — Encounter (HOSPITAL_COMMUNITY): Payer: Self-pay

## 2010-08-12 DIAGNOSIS — K3189 Other diseases of stomach and duodenum: Secondary | ICD-10-CM | POA: Insufficient documentation

## 2010-08-12 DIAGNOSIS — R6881 Early satiety: Secondary | ICD-10-CM | POA: Insufficient documentation

## 2010-08-12 DIAGNOSIS — E119 Type 2 diabetes mellitus without complications: Secondary | ICD-10-CM | POA: Insufficient documentation

## 2010-08-12 DIAGNOSIS — R1013 Epigastric pain: Secondary | ICD-10-CM | POA: Insufficient documentation

## 2010-08-12 MED ORDER — TECHNETIUM TC 99M SULFUR COLLOID
2.1000 | Freq: Once | INTRAVENOUS | Status: AC | PRN
  Administered 2010-08-12: 2.1 via ORAL

## 2010-08-12 NOTE — Letter (Signed)
Summary: [facility] Instructions  [facility]  [address] [place], [place] [address]   Phone: [phone]  Fax: [phone]       DAMONIQUE BRUNELLE    [birth year]    MRN: [id number]        Procedure Day Dorna Bloom: Wednesday 09/03/2010     Arrival Time: 7:30am     Procedure Time: 8:30am     Location of Procedure:                    X   [facility] (4th Floor)                        PREPARATION FOR COLONOSCOPY WITH MOVIPREP   Starting 5 days prior to your procedure 08/29/2010 do not eat nuts, seeds, popcorn, corn, beans, peas,  salads, or any raw vegetables.  Do not take any fiber supplements (e.g. Metamucil, Citrucel, and Benefiber).  THE DAY BEFORE YOUR PROCEDURE         Tuesday 09/02/2010  1.  Drink clear liquids the entire day-NO SOLID FOOD  2.  Do not drink anything colored red or purple.  Avoid juices with pulp.  No orange juice.  3.  Drink at least 64 oz. (8 glasses) of fluid/clear liquids during the day to prevent dehydration and help the prep work efficiently.  CLEAR LIQUIDS INCLUDE: Water Jello Ice Popsicles Tea (sugar ok, no milk/cream) Powdered fruit flavored drinks Coffee (sugar ok, no milk/cream) Gatorade Juice: apple, white grape, white cranberry  Lemonade Clear bullion, consomm, broth Carbonated beverages (any kind) Strained chicken noodle soup Hard Candy                             4.  In the morning, mix first dose of MoviPrep solution:    Empty 1 Pouch A and 1 Pouch B into the disposable container    Add lukewarm drinking water to the top line of the container. Mix to dissolve    Refrigerate (mixed solution should be used within 24 hrs)  5.  Begin drinking the prep at 5:00 p.m. The MoviPrep container is divided by 4 marks.   Every 15 minutes drink the solution down to the next mark (approximately 8 oz) until the full liter is complete.   6.  Follow completed prep with 16 oz of clear liquid of your choice (Nothing red or  purple).  Continue to drink clear liquids until bedtime.  7.  Before going to bed, mix second dose of MoviPrep solution:    Empty 1 Pouch A and 1 Pouch B into the disposable container    Add lukewarm drinking water to the top line of the container. Mix to dissolve    Refrigerate  THE DAY OF YOUR PROCEDURE      Wednesday 09/03/2010  Beginning at 3:30am (5 hours before procedure):         1. Every 15 minutes, drink the solution down to the next mark (approx 8 oz) until the full liter is complete.  2. Follow completed prep with 16 oz. of clear liquid of your choice.    3. You may drink clear liquids until 6:30am (2 HOURS BEFORE PROCEDURE).   MEDICATION INSTRUCTIONS  Unless otherwise instructed, you should take regular prescription medications with a small sip of water   as early as possible the morning of your procedure.  Diabetic patients - see separate instructions.  OTHER INSTRUCTIONS  You will need a responsible adult at least 48 years of age to accompany you and drive you home.   This person must remain in the waiting room during your procedure.  Wear loose fitting clothing that is easily removed.  Leave jewelry and other valuables at home.  However, you may wish to bring a book to read or  an iPod/MP3 player to listen to music as you wait for your procedure to start.  Remove all body piercing jewelry and leave at home.  Total time from sign-in until discharge is approximately 2-3 hours.  You should go home directly after your procedure and rest.  You can resume normal activities the  day after your procedure.  The day of your procedure you should not:   Drive   Make legal decisions   Operate machinery   Drink alcohol   Return to work  You will receive specific instructions about eating, activities and medications before you leave.    The above instructions have been reviewed and explained to me by   _______________________    I fully  understand and can verbalize these instructions _____________________________ Date _________
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
## 2010-08-12 NOTE — Letter (Signed)
Summary: Diabetic Instructions  [facility]  [address] [place], [place] [address]   Phone: [phone]  Fax: [phone]    Alexa Woods 1961-05-29 MRN: [id number]      X   INSULIN (SHORT ACTING) MEDICATION INSTRUCTIONS (Regular, Humulog, Novolog, Levemir)   The day before your procedure:   Do not take your evening dose   The day of your procedure:   Do not take your morning dose

## 2010-08-12 NOTE — Miscellaneous (Signed)
Summary: Initial Evaluation for PT/[facility]  Initial Evaluation for PT/[facility]   Imported By: Maryln Gottron 08/06/2010 13:53:31  _____________________________________________________________________  External Attachment:    Type:   Image     Comment:   External Document

## 2010-08-13 ENCOUNTER — Telehealth: Payer: Self-pay | Admitting: Gastroenterology

## 2010-08-15 ENCOUNTER — Telehealth: Payer: Self-pay | Admitting: Family Medicine

## 2010-08-18 ENCOUNTER — Emergency Department (HOSPITAL_COMMUNITY)
Admission: EM | Admit: 2010-08-18 | Discharge: 2010-08-18 | Disposition: A | Discharge status: Home / Self Care | Payer: PRIVATE HEALTH INSURANCE | Attending: Emergency Medicine | Admitting: Emergency Medicine

## 2010-08-18 DIAGNOSIS — E119 Type 2 diabetes mellitus without complications: Secondary | ICD-10-CM | POA: Insufficient documentation

## 2010-08-18 DIAGNOSIS — M79609 Pain in unspecified limb: Secondary | ICD-10-CM | POA: Insufficient documentation

## 2010-08-18 DIAGNOSIS — F329 Major depressive disorder, single episode, unspecified: Secondary | ICD-10-CM | POA: Insufficient documentation

## 2010-08-18 DIAGNOSIS — F3289 Other specified depressive episodes: Secondary | ICD-10-CM | POA: Insufficient documentation

## 2010-08-18 DIAGNOSIS — Z79899 Other long term (current) drug therapy: Secondary | ICD-10-CM | POA: Insufficient documentation

## 2010-08-18 DIAGNOSIS — M542 Cervicalgia: Secondary | ICD-10-CM | POA: Insufficient documentation

## 2010-08-18 DIAGNOSIS — M25519 Pain in unspecified shoulder: Secondary | ICD-10-CM | POA: Insufficient documentation

## 2010-08-18 DIAGNOSIS — J45909 Unspecified asthma, uncomplicated: Secondary | ICD-10-CM | POA: Insufficient documentation

## 2010-08-18 LAB — URINALYSIS, ROUTINE W REFLEX MICROSCOPIC
Bilirubin Urine: NEGATIVE
Hgb urine dipstick: NEGATIVE
Specific Gravity, Urine: 1.019 (ref 1.005–1.030)
pH: 6 (ref 5.0–8.0)

## 2010-08-18 LAB — COMPREHENSIVE METABOLIC PANEL
ALT: 29 U/L (ref 0–35)
CO2: 29 mEq/L (ref 19–32)
Calcium: 9.5 mg/dL (ref 8.4–10.5)
Chloride: 98 mEq/L (ref 96–112)
Creatinine, Ser: 0.78 mg/dL (ref 0.4–1.2)
GFR calc non Af Amer: >60 mL/min (ref >60)
Glucose, Bld: 238 mg/dL — ABNORMAL HIGH (ref 70–99)
Sodium: 138 mEq/L (ref 135–145)
Total Bilirubin: 0.4 mg/dL (ref 0.3–1.2)

## 2010-08-18 LAB — GLUCOSE, CAPILLARY: Glucose-Capillary: 191 mg/dL — ABNORMAL HIGH (ref 70–99)

## 2010-08-18 LAB — CBC
Hemoglobin: 13.5 g/dL (ref 12.0–15.0)
MCHC: 34.2 g/dL (ref 30.0–36.0)
MCV: 83.3 fL (ref 78.0–100.0)
RBC: 4.75 MIL/uL (ref 3.87–5.11)

## 2010-08-18 LAB — PROTIME-INR: Prothrombin Time: 12.4 seconds (ref 11.6–15.2)

## 2010-08-18 LAB — TYPE AND SCREEN: ABO/RH(D): A POS

## 2010-08-19 NOTE — Progress Notes (Signed)
Summary: speak to nurse  Phone Note Call from Patient Call back at [phone]   Caller: Patient Call For: Dr Jarold Motto Reason for Call: Talk to Nurse Summary of Call: Patient wants to speak to nurse regarding her emptying scan Initial call taken by: Tawni Levy,  August 11, 2010 9:48 AM  Follow-up for Phone Call        questions answered.  Follow-up by: Harlow Mares CMA Duncan Dull),  August 11, 2010 1:09 PM

## 2010-08-19 NOTE — Progress Notes (Signed)
Summary: Elevated BP  Phone Note Call from Patient   Caller: Patient Call For: Dr Para March Summary of Call: Pt just left orthopedic office with shoulder and back pain. Pt's BP 181/120.Pt takes HCTZ 25mg  one daily. Pt said her BP usually 115/70. Pt has h/a but no dizziness, no chest pain,no vision problems, no weakness,no change in speech or swallowing.Pt is concerned about elevated BP. Dr Para March said need to be seen at [facility]. Pt will call back to our office on Mon with update. Initial call taken by: [name] LPN,  August 15, 2010 5:07 PM  Follow-up for Phone Call        agree with recommendation to go to [facility] Follow-up by: Judith Part MD,  August 16, 2010 9:55 AM

## 2010-08-19 NOTE — Progress Notes (Signed)
Summary: resch proc  Phone Note Call from Patient Call back at Home Phone [phone]   Caller: Patient Call For: Dr Reason for Call: Talk to Nurse Summary of Call: Patient wants to reschedule her appt 4-4 egd w. propofol, Initial call taken by: Tawni Levy,  August 13, 2010 2:12 PM  Follow-up for Phone Call        advised pt that there is no other day to reschedule this procedure he only has one day in April.  Follow-up by: Harlow Mares CMA Duncan Dull),  August 13, 2010 3:09 PM

## 2010-08-20 ENCOUNTER — Encounter: Payer: Self-pay | Admitting: Family Medicine

## 2010-08-21 ENCOUNTER — Ambulatory Visit: Payer: PRIVATE HEALTH INSURANCE | Admitting: Family Medicine

## 2010-08-21 LAB — BASIC METABOLIC PANEL
BUN: 6 mg/dL (ref 6–23)
Calcium: 8 mg/dL — ABNORMAL LOW (ref 8.4–10.5)
Calcium: 8.3 mg/dL — ABNORMAL LOW (ref 8.4–10.5)
Creatinine, Ser: 0.85 mg/dL (ref 0.4–1.2)
GFR calc Af Amer: >60 mL/min (ref >60)
GFR calc non Af Amer: >60 mL/min (ref >60)
GFR calc non Af Amer: >60 mL/min (ref >60)
Glucose, Bld: 212 mg/dL — ABNORMAL HIGH (ref 70–99)
Potassium: 3.5 mEq/L (ref 3.5–5.1)
Sodium: 137 mEq/L (ref 135–145)

## 2010-08-21 LAB — PROTIME-INR
INR: 1.11 (ref 0.00–1.49)
INR: 1.34 (ref 0.00–1.49)
Prothrombin Time: 14.2 seconds (ref 11.6–15.2)
Prothrombin Time: 20.4 seconds — ABNORMAL HIGH (ref 11.6–15.2)

## 2010-08-21 LAB — CBC
HCT: 32.1 % — ABNORMAL LOW (ref 36.0–46.0)
HCT: 32.7 % — ABNORMAL LOW (ref 36.0–46.0)
Hemoglobin: 11 g/dL — ABNORMAL LOW (ref 12.0–15.0)
Platelets: 210 10*3/uL (ref 150–400)
Platelets: ADEQUATE 10*3/uL (ref 150–400)
RBC: 3.78 MIL/uL — ABNORMAL LOW (ref 3.87–5.11)
RDW: 13.8 % (ref 11.5–15.5)
RDW: 14.1 % (ref 11.5–15.5)
WBC: 11 10*3/uL — ABNORMAL HIGH (ref 4.0–10.5)
WBC: 8.1 10*3/uL (ref 4.0–10.5)
WBC: 8.7 10*3/uL (ref 4.0–10.5)

## 2010-08-21 LAB — GLUCOSE, CAPILLARY
Glucose-Capillary: 154 mg/dL — ABNORMAL HIGH (ref 70–99)
Glucose-Capillary: 155 mg/dL — ABNORMAL HIGH (ref 70–99)
Glucose-Capillary: 158 mg/dL — ABNORMAL HIGH (ref 70–99)
Glucose-Capillary: 204 mg/dL — ABNORMAL HIGH (ref 70–99)
Glucose-Capillary: 211 mg/dL — ABNORMAL HIGH (ref 70–99)
Glucose-Capillary: 216 mg/dL — ABNORMAL HIGH (ref 70–99)
Glucose-Capillary: 219 mg/dL — ABNORMAL HIGH (ref 70–99)
Glucose-Capillary: 220 mg/dL — ABNORMAL HIGH (ref 70–99)
Glucose-Capillary: 258 mg/dL — ABNORMAL HIGH (ref 70–99)

## 2010-08-22 ENCOUNTER — Other Ambulatory Visit: Payer: Self-pay | Admitting: *Deleted

## 2010-08-22 MED ORDER — HYDROCHLOROTHIAZIDE 25 MG PO TABS: 25.0000 mg | ORAL_TABLET | Freq: Every day | ORAL | Status: DC

## 2010-08-25 ENCOUNTER — Other Ambulatory Visit: Payer: Self-pay | Admitting: *Deleted

## 2010-08-25 MED ORDER — HYDROCHLOROTHIAZIDE 25 MG PO TABS: 25.0000 mg | ORAL_TABLET | Freq: Every day | ORAL | Status: DC

## 2010-08-29 ENCOUNTER — Other Ambulatory Visit: Payer: Self-pay | Admitting: *Deleted

## 2010-08-29 MED ORDER — HYDROCHLOROTHIAZIDE 25 MG PO TABS: 25.0000 mg | ORAL_TABLET | Freq: Every day | ORAL | Status: DC

## 2010-09-02 ENCOUNTER — Telehealth: Payer: Self-pay | Admitting: Gastroenterology

## 2010-09-02 ENCOUNTER — Encounter: Payer: Self-pay | Admitting: Gastroenterology

## 2010-09-02 NOTE — Telephone Encounter (Signed)
Patient had questions about taking her pain medication before she comes in tomorrow.  Has severe shoulder pain from a prior surgery.  Also wanted to know about clear liquid diet.  Answers all her questions and instructed her to please call back if she had any further questions.

## 2010-09-03 ENCOUNTER — Encounter: Payer: Self-pay | Admitting: Gastroenterology

## 2010-09-03 ENCOUNTER — Ambulatory Visit (AMBULATORY_SURGERY_CENTER): Payer: PRIVATE HEALTH INSURANCE | Admitting: Gastroenterology

## 2010-09-03 VITALS — BP 141/76 | HR 80 | Temp 97.6°F | Resp 18 | Ht 66.0 in | Wt 248.0 lb

## 2010-09-03 DIAGNOSIS — R1310 Dysphagia, unspecified: Secondary | ICD-10-CM

## 2010-09-03 DIAGNOSIS — D1330 Benign neoplasm of unspecified part of small intestine: Secondary | ICD-10-CM

## 2010-09-03 DIAGNOSIS — Z1211 Encounter for screening for malignant neoplasm of colon: Secondary | ICD-10-CM

## 2010-09-03 DIAGNOSIS — K219 Gastro-esophageal reflux disease without esophagitis: Secondary | ICD-10-CM

## 2010-09-03 LAB — HM COLONOSCOPY: HM Colonoscopy: NEGATIVE

## 2010-09-03 LAB — GLUCOSE, CAPILLARY: Glucose-Capillary: 215 mg/dL — ABNORMAL HIGH (ref 70–99)

## 2010-09-03 MED ORDER — SODIUM CHLORIDE 0.9 % IV SOLN: 500.0000 mL | INTRAVENOUS | Status: DC

## 2010-09-03 NOTE — Patient Instructions (Addendum)
PT GIVEN DILATION DIET AND HAND OUT PRIOR TO DISCHARGE COLON NORMAL-REPEAT EXAM IN 10 [id number] ANTI-REFLUX MEASURES CONTINUE CURRENT MEDICATIONS

## 2010-09-04 ENCOUNTER — Telehealth: Payer: Self-pay | Admitting: *Deleted

## 2010-09-04 ENCOUNTER — Encounter: Payer: Self-pay | Admitting: Gastroenterology

## 2010-09-04 DIAGNOSIS — K222 Esophageal obstruction: Secondary | ICD-10-CM

## 2010-09-04 DIAGNOSIS — R1013 Epigastric pain: Secondary | ICD-10-CM

## 2010-09-04 DIAGNOSIS — K219 Gastro-esophageal reflux disease without esophagitis: Secondary | ICD-10-CM

## 2010-09-04 LAB — HELICOBACTER PYLORI SCREEN-BIOPSY: UREASE: NEGATIVE

## 2010-09-04 MED ORDER — AMBULATORY NON FORMULARY MEDICATION: Status: DC

## 2010-09-04 NOTE — Telephone Encounter (Signed)
Patient needs to call back to schedule a one month follow up appt and I have sent her RX for Domperidone to [facility]. They will contact her about getting payment. I have left all this on her voicemail.

## 2010-09-04 NOTE — Telephone Encounter (Signed)

## 2010-09-05 ENCOUNTER — Emergency Department: Payer: Self-pay | Admitting: Emergency Medicine

## 2010-09-08 ENCOUNTER — Ambulatory Visit (INDEPENDENT_AMBULATORY_CARE_PROVIDER_SITE_OTHER): Payer: PRIVATE HEALTH INSURANCE | Admitting: Family Medicine

## 2010-09-08 ENCOUNTER — Encounter: Payer: Self-pay | Admitting: Family Medicine

## 2010-09-08 VITALS — BP 112/66 | HR 80 | Temp 98.3°F | Wt 241.0 lb

## 2010-09-08 DIAGNOSIS — R071 Chest pain on breathing: Secondary | ICD-10-CM

## 2010-09-08 DIAGNOSIS — R0789 Other chest pain: Secondary | ICD-10-CM | POA: Insufficient documentation

## 2010-09-08 MED ORDER — OXYCODONE-ACETAMINOPHEN 5-325 MG PO TABS: 1.0000 | ORAL_TABLET | Freq: Four times a day (QID) | ORAL | Status: AC | PRN

## 2010-09-08 NOTE — Patient Instructions (Signed)
Use the percocet as needed for pain.  It can make you drowsy and constipated.  Keep your appointments with Dr. Rennis Chris and Dr. Ethelene Hal.  Use a pillow against your chest when you need to cough or sneeze.  This should gradually get better.  Take care.

## 2010-09-08 NOTE — Progress Notes (Signed)
Friday to [facility] after falling (was knocked down by her dog).  Fell onto L shoulder, arm, chest wall. She was dazed after the fall and husband took her to ER.  Xrayed her shoulder and arms, not chest per patient.  Still with chest wall pain, worse with cough, deep breath, on L side.  No FCNAV.  I don't have ER recs to review.  She had gotten some pain medicine at the ER and this helped, but she was continuing to have have pain.  She tolerates percocet but has other intolerances.  Not sob.  No other falls. No focal new neuro changes.    Meds, vitals, and allergies reviewed.   ROS: See HPI.  Otherwise, noncontributory.  Nad, but uncomfortable ncat Mmm Neck supple rrr ctab w/o focal dec in bs L shoulder with painful rom, at baseline per patient, incision healed L chest wall ttp, esp at anterior inferior ribs in the mid clavicular line.  I don't feel an abnormal bony protrusion but the skin in the area is thickened and tender.  Back w/o midline pain

## 2010-09-08 NOTE — Assessment & Plan Note (Signed)
I would use the percocet prn in the meantime and a pillow with cough/sneeze.  This should gradually improve.  I talked to patient about this.  Rib fx is unlikely and even if present, it wouldn't change management at this point.  She understood.  Fu prn. I'll ask staff to send the note to Dr. Ethelene Hal and Dr. Rennis Chris (pt is aware and agrees).

## 2010-09-09 ENCOUNTER — Encounter: Payer: Self-pay | Admitting: Gastroenterology

## 2010-10-07 ENCOUNTER — Ambulatory Visit (INDEPENDENT_AMBULATORY_CARE_PROVIDER_SITE_OTHER): Payer: PRIVATE HEALTH INSURANCE | Admitting: Gastroenterology

## 2010-10-07 ENCOUNTER — Encounter: Payer: Self-pay | Admitting: Gastroenterology

## 2010-10-07 VITALS — BP 120/72 | HR 84 | Ht 66.0 in | Wt 236.0 lb

## 2010-10-07 DIAGNOSIS — E118 Type 2 diabetes mellitus with unspecified complications: Secondary | ICD-10-CM

## 2010-10-07 DIAGNOSIS — K3184 Gastroparesis: Secondary | ICD-10-CM

## 2010-10-07 DIAGNOSIS — K219 Gastro-esophageal reflux disease without esophagitis: Secondary | ICD-10-CM

## 2010-10-07 MED ORDER — POLYETHYLENE GLYCOL 3350 17 GM/SCOOP PO POWD: 17.0000 g | Freq: Every day | ORAL | Status: AC

## 2010-10-07 NOTE — Patient Instructions (Signed)
Follow Step 3 Gastroparesis Diet Take Miralax every day.

## 2010-10-07 NOTE — Progress Notes (Signed)
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 Recent gastric empty scan showed 40% retention at 2 hours. The past she's had neurological side effects with Reglan. She has not filled her prescription for domperidone is suggested. In addition to early satiety and epigastric abdominal pain, she also has refractory constipation. Recent endoscopic exam was unremarkable so for evidence of acid reflux, and she is on Dexilant 60 mg a day. Biopsies for H. pylori and for celiac disease were negative.    Current Medications, Allergies, Past Medical History, Past Surgical History, Family History and Social History were reviewed in [employer] record.   Assessment and plan: Diabetic gastroparesis and constipation. I placed her on a step 3 gastroparesis diet, domperidone 10 mg 3 times a day before meals, MiraLax 8 ounces at bedtime. I will see her back in one to 2 months for followup. Encounter Diagnosis  Name Primary?  . Gastroparesis Yes

## 2010-10-10 ENCOUNTER — Ambulatory Visit (INDEPENDENT_AMBULATORY_CARE_PROVIDER_SITE_OTHER): Payer: PRIVATE HEALTH INSURANCE | Admitting: Endocrinology

## 2010-10-10 ENCOUNTER — Encounter: Payer: Self-pay | Admitting: Endocrinology

## 2010-10-10 ENCOUNTER — Other Ambulatory Visit (INDEPENDENT_AMBULATORY_CARE_PROVIDER_SITE_OTHER): Payer: PRIVATE HEALTH INSURANCE

## 2010-10-10 VITALS — BP 104/68 | HR 74 | Temp 98.0°F | Ht 66.0 in | Wt 237.8 lb

## 2010-10-10 DIAGNOSIS — E119 Type 2 diabetes mellitus without complications: Secondary | ICD-10-CM

## 2010-10-10 LAB — HEMOGLOBIN A1C: Hgb A1c MFr Bld: 9.5 % — ABNORMAL HIGH (ref 4.6–6.5)

## 2010-10-10 MED ORDER — FLUTICASONE PROPIONATE 0.005 % EX OINT: TOPICAL_OINTMENT | CUTANEOUS | Status: DC

## 2010-10-10 NOTE — Patient Instructions (Addendum)
blood tests are being ordered for you today.  please call [phone] to hear your test results.  You will be prompted to enter the 9-digit "MRN" number that appears at the top left of this page, followed by #.  Then you will hear the message. Pending the test results, please continue the same medications for now. Please make a follow-up appointment in 3 months. i have sent a prescription to your pharmacy, for a skin ointment. (update: i left message on phone-tree:  Change levemir to lantus 50 units qam).
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
## 2010-10-11 LAB — FRUCTOSAMINE: Fructosamine: 340 umol/L — ABNORMAL HIGH (ref <285)

## 2010-10-11 MED ORDER — INSULIN GLARGINE 100 UNIT/ML ~~LOC~~ SOLN: 50.0000 [IU] | Freq: Every day | SUBCUTANEOUS | Status: DC

## 2010-10-16 ENCOUNTER — Telehealth: Payer: Self-pay | Admitting: Gastroenterology

## 2010-10-16 NOTE — Telephone Encounter (Signed)
Pt with hx of GERG, Gastroparesis and multiple other health problems. Pt with OV on 10/07/10 for Gastroparesis and Constipation, recent GES. Pt was placed on Miralax 8 oz at bedtime, Domperidome and Gastroparesis Step 3 diet. Pt reports her stomach is still hurting at the very top; she says it's like someone punched her in the stomach. She c/o nausea and it's so bad at times, she vomits. Pt cannot take Reglan- hx of Bell's Palsy. Pt remains on Dexilant and has Promethazine and Ondansetron. Pt says she cannot afford the Domperidome- it's $50/month and she has to pay out of pocket for it. I asked pt if she was trying the diet and she stated she doesn't really want anything to eat. When I went over the diet and foods, she stated she hadn't had time to prepare to prepare the food. Asked pt to follow the diet to see if it helps and she stated understanding. Dr Jarold Motto, is there another med pt can try or advice other than following the diet? Thanks.

## 2010-10-17 NOTE — Telephone Encounter (Signed)
She can afford domperidome.Marland Kitchen

## 2010-10-17 NOTE — Telephone Encounter (Signed)
lmom for pt that Dr Jarold Motto stated there was nothing else he can offer for her problems other than the Domperidome- they offer no patient assistance.

## 2010-10-28 ENCOUNTER — Encounter: Payer: Self-pay | Admitting: Family Medicine

## 2010-10-28 ENCOUNTER — Ambulatory Visit (INDEPENDENT_AMBULATORY_CARE_PROVIDER_SITE_OTHER): Payer: PRIVATE HEALTH INSURANCE | Admitting: Family Medicine

## 2010-10-28 ENCOUNTER — Encounter: Payer: Self-pay | Admitting: *Deleted

## 2010-10-28 VITALS — BP 116/78 | HR 76 | Temp 98.3°F | Wt 237.1 lb

## 2010-10-28 DIAGNOSIS — J069 Acute upper respiratory infection, unspecified: Secondary | ICD-10-CM | POA: Insufficient documentation

## 2010-10-28 MED ORDER — FLUTICASONE PROPIONATE 50 MCG/ACT NA SUSP: 2.0000 | Freq: Every day | NASAL | Status: DC

## 2010-10-28 MED ORDER — ALBUTEROL SULFATE HFA 108 (90 BASE) MCG/ACT IN AERS: 2.0000 | INHALATION_SPRAY | RESPIRATORY_TRACT | Status: DC | PRN

## 2010-10-28 MED ORDER — HYDROCOD POLST-CHLORPHEN POLST 10-8 MG/5ML PO LQCR: 5.0000 mL | Freq: Every evening | ORAL | Status: DC | PRN

## 2010-10-28 NOTE — Patient Instructions (Signed)
Sounds like bad upper respiratory infection, likely viral. Push fluids and plenty of rest.   If at work, wear mask. For throat, ibuprofen for inflammation.  Suck on cold things like popsicles or warm things like herbal teas, whichever soothes the throat better. For cough, use tussionex. Update [place] if fever >101, or worsening cough/trouble breathing. Schedule albuterol for next few days every 4-6 hours.

## 2010-10-28 NOTE — Progress Notes (Signed)
  Subjective:    Patient ID: Alexa Woods, female    DOB: 06-25-1960, 50 y.o.   MRN: [id number]  HPI CC: ST, cough  4d h/o ST, cough, hoarse voice.  Breathing "bothering her", thinks asthma acting up.  Cough productive of dark yellow sputum.  + chills.  + itchy eyes and sinus headache esp L maxillary side.    No fevers, abd pain, ear pain, tooth pain.  Husband smokes outside.  Pt quit smoking 9 years ago.  Daughter's boyfriend with strep throat but pt hasn't had exposure to him, just daughter.  Manager sick at work.  H/o DM, gastroparesis, asthma, allergies.  Review of Systems Per HPI    Objective:   Physical Exam  Nursing note and vitals reviewed. Constitutional: She appears well-developed and well-nourished. No distress.       Congested, tired appearing  HENT:  Head: Normocephalic and atraumatic.  Right Ear: Hearing, tympanic membrane, external ear and ear canal normal.  Left Ear: Hearing, tympanic membrane, external ear and ear canal normal.  Nose: Right sinus exhibits no maxillary sinus tenderness and no frontal sinus tenderness. Left sinus exhibits maxillary sinus tenderness. Left sinus exhibits no frontal sinus tenderness.  Mouth/Throat: Oropharynx is clear and moist. No oropharyngeal exudate.  Neck: Normal range of motion. Neck supple. No thyromegaly present.  Cardiovascular: Normal rate, regular rhythm, normal heart sounds and intact distal pulses.   No murmur heard. Pulmonary/Chest: Effort normal and breath sounds normal. No respiratory distress. She has no wheezes. She has no rales.  Lymphadenopathy:    She has no cervical adenopathy.  Skin: Skin is warm and dry. No rash noted.  Psychiatric: She has a normal mood and affect.          Assessment & Plan:

## 2010-10-28 NOTE — Assessment & Plan Note (Signed)
Viral uri with cough, laryngitis and pharyngitis. Supportive care as per instructions. Out of work letter provided for next 2 days. Update if red flags or worsening. tussionex as pt states has worked well in past.

## 2010-10-30 ENCOUNTER — Telehealth: Payer: Self-pay | Admitting: *Deleted

## 2010-10-30 MED ORDER — AZITHROMYCIN 250 MG PO TABS: ORAL_TABLET | ORAL | Status: AC

## 2010-10-30 MED ORDER — AMOXICILLIN 875 MG PO TABS: 875.0000 mg | ORAL_TABLET | Freq: Two times a day (BID) | ORAL | Status: AC

## 2010-10-30 NOTE — Telephone Encounter (Signed)
Spoke with patient and notified her. She said she has developed wheezing which she didn't have at her visit. She is using her inhalers, but they're not much help. She also said she is coughing up thick brownish sputum along with having pressure around/above her eyes. I told her I would call her back if you decided she needed to go ahead and start the abx. I also told her that if I didn't call her back-not to start them unless she developed fever over 101 or started getting worse. She verbalized understanding.

## 2010-10-30 NOTE — Telephone Encounter (Signed)
Pt states she is not any better then when she was seen yesterday for URI.  She is asking if she can have an antibiotic called to [facility]- she says she has the same symptoms as yesterday, but worse.

## 2010-10-30 NOTE — Telephone Encounter (Signed)
May start abx. Please call pharmacy and cancel script for amoxicillin, instead will teat with zpack (sent in). If wheezing, abx may not be enough and may need steroid course.  If that is the case, may need to be seen again.

## 2010-10-30 NOTE — Telephone Encounter (Signed)
Sounded like viral URTI last visit, antibiotics will not help.  Will just need time and supportive care like we discussed at office visit. abx have several side effects that we'd rather avoid, and giving a patient too much abx builds resistance in bugs, in future making it harder to treat bacterial infections. As coming into weekend, will send abx to pharmacy but only want her to fill if starts having fever >101, or worsening cough/sinus congestion.

## 2010-10-30 NOTE — Telephone Encounter (Signed)
Patient notified. She will start abx and if not better or worsening wheezing she will come back for recheck.

## 2010-11-10 ENCOUNTER — Ambulatory Visit (INDEPENDENT_AMBULATORY_CARE_PROVIDER_SITE_OTHER): Payer: PRIVATE HEALTH INSURANCE | Admitting: Family Medicine

## 2010-11-10 ENCOUNTER — Encounter: Payer: Self-pay | Admitting: Family Medicine

## 2010-11-10 VITALS — BP 136/72 | HR 80 | Temp 98.6°F | Ht 66.0 in | Wt 237.8 lb

## 2010-11-10 DIAGNOSIS — M25519 Pain in unspecified shoulder: Secondary | ICD-10-CM

## 2010-11-10 DIAGNOSIS — M25512 Pain in left shoulder: Secondary | ICD-10-CM | POA: Insufficient documentation

## 2010-11-10 DIAGNOSIS — J209 Acute bronchitis, unspecified: Secondary | ICD-10-CM | POA: Insufficient documentation

## 2010-11-10 MED ORDER — AZITHROMYCIN 250 MG PO TABS: ORAL_TABLET | ORAL | Status: AC

## 2010-11-10 NOTE — Assessment & Plan Note (Signed)
With very mild reactive airways Rescue inhaler prn- no need for prednisone tx with zpak due to time frame  Update if worse or not improved 1 wk

## 2010-11-10 NOTE — Patient Instructions (Signed)
We will do orthopedic referral at check out  Take zithromax for bronchitis  (I sent that to your pharmacy) If wheezing worsens or if you get worse please update me  Keep watching diet for sugars and fats

## 2010-11-10 NOTE — Progress Notes (Signed)
Subjective:    Patient ID: Alexa Woods, female    DOB: 03-25-1961, 50 y.o.   MRN: [id number]  HPI Here for L shoulder pain   --- already had surgery in past - for lipoma and bone spur (shortened collar bone) and also rot cuff 12/11 Has hx of bursitis MRI showed bursitis and inflammation  Then fell on it  Saw Dr Rennis Chris and then Dr Ethelene Hal in April- given percocet 10/325 -- which at the time she was taking bid on average (trying to minimize it as much as possible)  She wants a 2nd opinion just to see -- feels like it is just not right  Also tizanidine for muscle relaxer  Ortho does not want to do injections until sugar is better   Saw Dr Carlyon Prows on 5/29-- has gotten worse  Bad post nasal drainage , cough is more prod - yellow mucous No fever  Some wheezing - getting worse at night  Nasal symptoms are improved   Wt is stable   Patient Active Problem List  Diagnoses  . HERPES ZOSTER, UNCOMPLICATED  . DIABETES MELLITUS, TYPE II  . HYPOKALEMIA  . MORBID OBESITY  . ANXIETY  . ALCOHOL ABUSE  . DEPRESSION  . HYPERTENSION  . HEMORRHOIDS  . ALLERGIC RHINITIS  . ASTHMA  . GERD  . CYSTITIS, CHRONIC  . PSORIASIS  . PATELLO-FEMORAL SYNDROME  . EPIGASTRIC PAIN  . BACK PAIN  . DYSPHAGIA UNSPECIFIED  . Left-sided chest wall pain  . Viral URI with cough  . Shoulder pain, left  . Acute bronchitis   Past Medical History  Diagnosis Date  . Epigastric abdominal pain   . Alcohol abuse, unspecified   . Allergic rhinitis, cause unspecified   . Anxiety state, unspecified   . Unspecified asthma   . Backache, unspecified   . Other chronic cystitis   . Depressive disorder, not elsewhere classified   . Type II or unspecified type diabetes mellitus without mention of complication, not stated as uncontrolled   . Dysphagia, unspecified   . Esophageal reflux   . Unspecified hemorrhoids without mention of complication   . Herpes zoster without mention of complication   . Unspecified  essential hypertension   . Hypopotassemia   . Morbid obesity   . Other screening mammogram   . Pain in joint, lower leg   . Other psoriasis   . History of Bell's palsy 4/09  . Wrist fracture   . Edema   . Gastroparesis   . Myocardial infarction    Past Surgical History  Procedure Date  . Appendectomy   . Cholecystectomy   . Partial hysterectomy   . Knee arthroscopy w/ partial medial meniscectomy 10/2008    and chondroplasty  . Total knee arthroplasty   . Esophagogastroduodenoscopy 07/2002    erythematous gastropathy  . Shoulder surgery 05/2010   History  Substance Use Topics  . Smoking status: Former Smoker    Quit date: 12/30/2001  . Smokeless tobacco: Never Used  . Alcohol Use: No     former alcoholic    Family History  Problem Relation Age of Onset  . Alcohol abuse Mother   . Hypertension Mother   . Esophageal cancer Maternal Grandmother    Allergies  Allergen Reactions  . Bupropion Hcl   . Codeine     REACTION: nausea and vomiting, rash  . Lidocaine     REACTION: unknown  . Metformin     REACTION: GI  . Omeprazole  REACTION: does not work for her  . Oxycodone     Sweating and anxiety attacks  . Paroxetine     REACTION: doesn't agree  . Propoxyphene N-Acetaminophen     REACTION: wheezing  . Sulfonamide Derivatives     REACTION: rash  . Tramadol Hcl     REACTION: Causes Anxiety   Current Outpatient Prescriptions on File Prior to Visit  Medication Sig Dispense Refill  . albuterol (VENTOLIN HFA) 108 (90 BASE) MCG/ACT inhaler Inhale 2 puffs into the lungs every 4 (four) hours as needed.  1 Inhaler  3  . aspirin 81 MG tablet Take 81 mg by mouth daily.        . Blood Glucose Monitoring Suppl (ONE TOUCH ULTRA SYSTEM KIT) W/DEVICE KIT 1 kit by Does not apply route as directed.        . chlorpheniramine-HYDROcodone (TUSSIONEX) 10-8 MG/5ML LQCR Take 5 mLs by mouth at bedtime as needed.  140 mL  0  . dexlansoprazole (DEXILANT) 60 MG capsule Take 60 mg by  mouth daily.        . fluticasone (FLONASE) 50 MCG/ACT nasal spray Place 2 sprays into the nose daily.  16 g  3  . Fluticasone-Salmeterol (ADVAIR DISKUS) 100-50 MCG/DOSE AEPB Inhale 1 puff into the lungs every 12 (twelve) hours.        . flyticasone (CUTIVATE) 0.005 % ointment 3x a day, as needed for rash  30 g  1  . Glucose Blood (BAYER BREEZE 2 TEST) DISK by In Vitro route 4 (four) times daily - after meals and at bedtime.        Marland Kitchen glucose blood (ONE TOUCH ULTRA TEST) test strip 1 each by Other route 2 (two) times daily. Use as instructed       . HC-Pramox-Diet Mng Prod -Wipe (ANALPRAM ADVANCED) 2.5-1 % KIT by Combination route 2 (two) times daily as needed.        . hydrochlorothiazide 25 MG tablet Take 1 tablet (25 mg total) by mouth daily.  30 tablet  11  . insulin aspart (NOVOLOG FLEXPEN) 100 UNIT/ML injection Inject 5 Units into the skin daily with supper. On sliding scale      . insulin glargine (LANTUS SOLOSTAR) 100 UNIT/ML injection Inject 50 Units into the skin daily. In the morning, and pen needles 2/day  30 mL  12  . Insulin Pen Needle (EASY TOUCH PEN NEEDLES) 31G X 8 MM MISC by Does not apply route as directed.        Marland Kitchen L-Methylfolate (DEPLIN) 15 MG TABS Take 1 capsule by mouth daily.        . Lancets (ONETOUCH ULTRASOFT) lancets 1 each by Other route as directed. Use as instructed       . LORazepam (ATIVAN) 1 MG tablet Take 1 mg by mouth as needed.        Marland Kitchen oxyCODONE-acetaminophen (PERCOCET) 10-325 MG per tablet Take 1 tablet by mouth every 6 (six) hours as needed. For pain       . potassium chloride (KLOR-CON) 10 MEQ CR tablet Take 10 mEq by mouth daily.        . sertraline (ZOLOFT) 100 MG tablet Take 100 mg by mouth 2 (two) times daily.        Marland Kitchen tiZANidine (ZANAFLEX) 4 MG tablet Take 4 mg by mouth every 8 (eight) hours as needed. For muscle spasm       . zolpidem (AMBIEN) 10 MG tablet Take 15 mg by mouth at  bedtime as needed.       . AMBULATORY NON FORMULARY MEDICATION Domperidone  10MG  take one by mouth 30 min before meals.  240 tablet  3  . amoxicillin (AMOXIL) 875 MG tablet Take 1 tablet (875 mg total) by mouth 2 (two) times daily.  20 tablet  0  . hydrocortisone (ANUSOL-HC) 2.5 % rectal cream Place rectally daily as needed. No more than 14 days in a row       . nystatin (MYCOSTATIN) cream Apply topically 2 (two) times daily.        Marland Kitchen olopatadine (PATANOL) 0.1 % ophthalmic solution Place 2 drops into both eyes 3 (three) times daily as needed.        Marland Kitchen DISCONTD: peg 3350 electrolyte powder (MOVIPREP) 100 G SOLR Take 1 kit by mouth once.         Current Facility-Administered Medications on File Prior to Visit  Medication Dose Route Frequency Provider Last Rate Last Dose  . 0.9 %  sodium chloride infusion  500 mL Intravenous Continuous Sheryn Bison, MD           Review of Systems Review of Systems  Constitutional: Negative for fever, appetite change,unexpected weight change. pos for fatigue  Eyes: Negative for pain and visual disturbance.  Respiratory: pos for cough and wheeze , no sob Cardiovascular: Negative.  for cp or palpitations Gastrointestinal: Negative for nausea, diarrhea and constipation.  Genitourinary: Negative for urgency and frequency.  Skin: Negative for pallor. or rash  MSK pos for shoulder pain / knee pain / no joint swelling  Neurological: Negative for weakness, light-headedness, numbness and headaches.  Hematological: Negative for adenopathy. Does not bruise/bleed easily.  Psychiatric/Behavioral: Negative for dysphoric mood. The patient is not nervous/anxious.          Objective:   Physical Exam  Constitutional: She appears well-developed and well-nourished. No distress.       overwt and well appearing   HENT:  Head: Normocephalic and atraumatic.  Right Ear: External ear normal.  Left Ear: External ear normal.  Mouth/Throat: No oropharyngeal exudate.       Nares congested Mild maxillary sinus tenderness Throat clear   Eyes:  Conjunctivae and EOM are normal. Pupils are equal, round, and reactive to light.  Neck: Normal range of motion. Neck supple. No JVD present. Carotid bruit is not present. Erythema present. No thyromegaly present.  Cardiovascular: Normal rate, regular rhythm and normal heart sounds.   Pulmonary/Chest: Effort normal and breath sounds normal. No respiratory distress. She has no rales. She exhibits no tenderness.       Harsh bs diffusely Some mild wheeze on forced exp only  No rales   Abdominal: Soft. Bowel sounds are normal.  Musculoskeletal: She exhibits tenderness. She exhibits no edema.       L shoulder medial scar Poor rom -unable to abd beyond 90 degrees  Tender over acromion and bicep No rash/ no swelling  Nl grip  Lymphadenopathy:    She has no cervical adenopathy.  Neurological: She is alert. She has normal reflexes. No cranial nerve deficit or sensory deficit.  Skin: Skin is warm and dry. No rash noted. No erythema. No pallor.  Psychiatric: She has a normal mood and affect.          Assessment & Plan:

## 2010-11-10 NOTE — Assessment & Plan Note (Signed)
S/p surgery in dec- with lipoma/ bone spur Now suspect bursitis based on MRI- Dr Ethelene Hal app to do injection due to DM  Ref for 2nd opinion to look at it  Pt wants to stop her pain med and is having a tough time

## 2010-12-04 ENCOUNTER — Telehealth: Payer: Self-pay | Admitting: Gastroenterology

## 2010-12-05 NOTE — Telephone Encounter (Signed)
lmom for pt to call back. Last OV 10/07/10 for abdominal pain. Pt was to start STEP 3 Gastroparesis diet with Miralax QHS and start Domperidone. Pt stated she could not afford the Domperidone. On 10/16/10, she called back d/t pain at the top of her stomach and n/v. Pt stated she can't take Reglan d/t Bell's Palsy and she can't afford Domperidone. Dr Jarold Motto instructed her to use the Phenergan, Zofran, Dexilant and remain on the diet.

## 2010-12-08 MED ORDER — ONDANSETRON HCL 4 MG PO TABS: 4.0000 mg | ORAL_TABLET | Freq: Three times a day (TID) | ORAL | Status: AC | PRN

## 2010-12-08 MED ORDER — DEXLANSOPRAZOLE 60 MG PO CPDR: 60.0000 mg | DELAYED_RELEASE_CAPSULE | Freq: Every day | ORAL | Status: DC

## 2010-12-08 NOTE — Telephone Encounter (Signed)
I strongly advised her to take her domperidone and to stay with her gastroparesis diet. We can refill her Zofran and PPI.

## 2010-12-08 NOTE — Telephone Encounter (Signed)
Patient states that she will try to get the domperidone and i have refilled zofran and dexilant.

## 2010-12-08 NOTE — Telephone Encounter (Addendum)
Pt reports the same problems with pain as before, only worse. She states she can't eat, but she has to d/t her diabetes. She cannot afford the Domperidone as reported on 10/28/10 telephone call. Last OV 10/07/10; hx of gastroparesis with GES showing 40% retention after 2 hours; GERD, DM, Abdominal Pain, Constipation. Pt unable to take REGLAN.  Recent ENDO showed REFLUX only with negative Celiac and H. Pylori. She was to follow Gastroparesis Step 3 diet, take Domperidone AC and Miralax QHS.   Pt's cell [phone] Pt remains on Dexilant and earlier was on Zofran and Phenergan.   Can pt have a refill on Zofran and any other advice? Thanks.

## 2011-02-05 ENCOUNTER — Ambulatory Visit (INDEPENDENT_AMBULATORY_CARE_PROVIDER_SITE_OTHER): Payer: PRIVATE HEALTH INSURANCE | Admitting: Family Medicine

## 2011-02-05 ENCOUNTER — Encounter: Payer: Self-pay | Admitting: Family Medicine

## 2011-02-05 VITALS — BP 114/78 | HR 76 | Temp 98.1°F | Wt 245.5 lb

## 2011-02-05 DIAGNOSIS — M549 Dorsalgia, unspecified: Secondary | ICD-10-CM

## 2011-02-05 DIAGNOSIS — R1310 Dysphagia, unspecified: Secondary | ICD-10-CM

## 2011-02-05 DIAGNOSIS — N3020 Other chronic cystitis without hematuria: Secondary | ICD-10-CM

## 2011-02-05 DIAGNOSIS — R1031 Right lower quadrant pain: Secondary | ICD-10-CM

## 2011-02-05 DIAGNOSIS — R300 Dysuria: Secondary | ICD-10-CM

## 2011-02-05 DIAGNOSIS — R1013 Epigastric pain: Secondary | ICD-10-CM

## 2011-02-05 LAB — POCT URINALYSIS DIPSTICK
Bilirubin, UA: NEGATIVE
Glucose, UA: NEGATIVE
Ketones, UA: NEGATIVE
Spec Grav, UA: 1.02

## 2011-02-05 NOTE — Assessment & Plan Note (Signed)
Pt has multiple medical problems that make discerning the reason for this discomfort difficult. She could have adhesions trapping or partially trapping one or more organs in the area. She could have an ovarian problem. She could have an intestinal problem. Urine on micro looked clean without white or red cells seen. She could have a kidney stone, but with pain for this long and minimal to no RBCs on micro, very unlikely. She is not supposed to have an appendix, so should not be that problem. ?Fecolith caught?  Will order abd/pelvic CT with and without to assess. Will get CBC hepatic and Bmet. Need BUN/Cr for the contrast.

## 2011-02-05 NOTE — Patient Instructions (Addendum)
Refer for Ct Abd and Pelvis LAb work prior today. 35 mins spent with pt.

## 2011-02-05 NOTE — Assessment & Plan Note (Signed)
This does not appear to be part of the problem but certainly could be part of the problem unknown to me.

## 2011-02-05 NOTE — Progress Notes (Signed)
  Subjective:    Patient ID: Alexa Woods, female    DOB: 05/17/1961, 50 y.o.   MRN: [id number]  HPI Pt of Dr Royden Purl here as acute appt for RLQ pain at times like someone is squeezing, sometimes dull ache. She has significant nausea with her gastroparesis but it has been worse since onset of pain. She has had this pain and nausea for the last month. She sees Dr Jarold Motto for her gastroparesis and can't afford her medication for this. Her last BM was this AM and was loose but brown without pain. Having the BM did not change/help/worsen the pain.  She had a D and C years ago for miscarriage and she always had pain with internal vaginal ultrasound in follow up, near the area of today's discomfort. She has breast tenderness at times and had hysterectomy but still has her ovaries. She had cholecystectomy in the past and had appendectomy at that time as well. She also had tooth removal in the past and has had gum pain and mild swelling in the area since the oral surgery.  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 She had shoulder surgery last year and is on chronic pain medication for this, Percocet afternoon and evening.  She has not had fever but has signoificant nausea and headaches. She had a nerve block in the left supraclavicular area that took away her taste for 3-4 mos and has never been the same. She has also had dilation on endoscopy in the past. Her kidney function and hepatic function have been nml, to her knowledge.  Review of SystemsNoncontributory except as above.     Objective:   Physical Exam  Constitutional: She appears well-developed and well-nourished. No distress.       Appears nontoxic and in some discomfort abdominally with movement but not severe.   HENT:  Head: Normocephalic and atraumatic.  Right Ear: External ear normal.  Left Ear:  External ear normal.  Nose: Nose normal.  Mouth/Throat: Oropharynx is clear and moist. No oropharyngeal exudate.  Eyes: Conjunctivae and EOM are normal. Pupils are equal, round, and reactive to light.  Neck: Normal range of motion. Neck supple. No thyromegaly present.  Cardiovascular: Normal rate, regular rhythm and normal heart sounds.   Pulmonary/Chest: Effort normal and breath sounds normal. She has no wheezes. She has no rales.  Abdominal: Soft. She exhibits no distension and no mass. There is tenderness. There is guarding (mild guarding throughout.). There is no rebound.  Musculoskeletal:       Chronic lower back pain which is exacerbated by down and up off the exam table. LEft shoulder discomfort worsened by ROM of body movement.  Lymphadenopathy:    She has no cervical adenopathy.  Skin: Skin is warm and dry. No rash noted. She is not diaphoretic. No erythema.          Assessment & Plan:

## 2011-02-05 NOTE — Assessment & Plan Note (Signed)
Suggested she try to walk as much as possible to aid the digestive track and help with weight. Every pound she loses will help multiple problems.

## 2011-02-05 NOTE — Assessment & Plan Note (Signed)
Does not have food sticking in the esophagus but does have complaints of GERD routinely. We discussed this with possible lifestyle approaches that might help some.

## 2011-02-05 NOTE — Assessment & Plan Note (Signed)
Suggested using Tyl for pain to augment and hopefully cut down the amount of Percocet taken regularly from two a day to one a day

## 2011-02-05 NOTE — Assessment & Plan Note (Signed)
The LUQ tenderness on exam? This is a very complicated, difficult pt to figure out.

## 2011-02-06 ENCOUNTER — Other Ambulatory Visit (INDEPENDENT_AMBULATORY_CARE_PROVIDER_SITE_OTHER): Payer: PRIVATE HEALTH INSURANCE

## 2011-02-06 ENCOUNTER — Ambulatory Visit (INDEPENDENT_AMBULATORY_CARE_PROVIDER_SITE_OTHER)
Admission: RE | Admit: 2011-02-06 | Discharge: 2011-02-06 | Disposition: A | Discharge status: Home / Self Care | Payer: PRIVATE HEALTH INSURANCE | Source: Ambulatory Visit | Attending: Family Medicine | Admitting: Family Medicine

## 2011-02-06 DIAGNOSIS — M549 Dorsalgia, unspecified: Secondary | ICD-10-CM

## 2011-02-06 DIAGNOSIS — R1013 Epigastric pain: Secondary | ICD-10-CM

## 2011-02-06 DIAGNOSIS — R1031 Right lower quadrant pain: Secondary | ICD-10-CM

## 2011-02-06 DIAGNOSIS — R1310 Dysphagia, unspecified: Secondary | ICD-10-CM

## 2011-02-06 DIAGNOSIS — R300 Dysuria: Secondary | ICD-10-CM

## 2011-02-06 DIAGNOSIS — N3020 Other chronic cystitis without hematuria: Secondary | ICD-10-CM

## 2011-02-06 LAB — RENAL FUNCTION PANEL
Albumin: 4 g/dL (ref 3.5–5.2)
BUN: 16 mg/dL (ref 6–23)
CO2: 30 mEq/L (ref 19–32)
Calcium: 9.2 mg/dL (ref 8.4–10.5)
Chloride: 101 mEq/L (ref 96–112)
Potassium: 3.6 mEq/L (ref 3.5–5.1)

## 2011-02-06 LAB — HEPATIC FUNCTION PANEL
Alkaline Phosphatase: 121 U/L — ABNORMAL HIGH (ref 39–117)
Bilirubin, Direct: 0.1 mg/dL (ref 0.0–0.3)
Total Bilirubin: 0.5 mg/dL (ref 0.3–1.2)
Total Protein: 7.9 g/dL (ref 6.0–8.3)

## 2011-02-06 LAB — CBC WITH DIFFERENTIAL/PLATELET
Basophils Absolute: 0 10*3/uL (ref 0.0–0.1)
Basophils Relative: 0.3 % (ref 0.0–3.0)
Eosinophils Absolute: 0.1 10*3/uL (ref 0.0–0.7)
Lymphocytes Relative: 27.5 % (ref 12.0–46.0)
MCHC: 34 g/dL (ref 30.0–36.0)
Monocytes Absolute: 0.6 10*3/uL (ref 0.1–1.0)
Neutrophils Relative %: 64.5 % (ref 43.0–77.0)
Platelets: 277 10*3/uL (ref 150.0–400.0)
RBC: 4.86 Mil/uL (ref 3.87–5.11)
WBC: 9 10*3/uL (ref 4.5–10.5)

## 2011-02-06 MED ORDER — IOHEXOL 300 MG/ML  SOLN
100.0000 mL | Freq: Once | INTRAMUSCULAR | Status: AC | PRN
  Administered 2011-02-06: 100 mL via INTRAVENOUS

## 2011-02-06 NOTE — Progress Notes (Signed)
Addended by: Baldomero Lamy on: 02/06/2011 09:21 AM   Modules accepted: Orders

## 2011-02-07 ENCOUNTER — Emergency Department (HOSPITAL_COMMUNITY)
Admission: EM | Admit: 2011-02-07 | Discharge: 2011-02-08 | Disposition: A | Discharge status: Home / Self Care | Payer: PRIVATE HEALTH INSURANCE | Attending: Emergency Medicine | Admitting: Emergency Medicine

## 2011-02-07 DIAGNOSIS — R112 Nausea with vomiting, unspecified: Secondary | ICD-10-CM | POA: Insufficient documentation

## 2011-02-07 DIAGNOSIS — R109 Unspecified abdominal pain: Secondary | ICD-10-CM | POA: Insufficient documentation

## 2011-02-07 LAB — URINALYSIS, ROUTINE W REFLEX MICROSCOPIC
Glucose, UA: NEGATIVE mg/dL
Leukocytes, UA: NEGATIVE
Protein, ur: NEGATIVE mg/dL
Specific Gravity, Urine: 1.021 (ref 1.005–1.030)
Urobilinogen, UA: 0.2 mg/dL (ref 0.0–1.0)

## 2011-02-07 LAB — DIFFERENTIAL
Basophils Relative: 1 % (ref 0–1)
Eosinophils Absolute: 0 10*3/uL (ref 0.0–0.7)
Neutro Abs: 6.6 10*3/uL (ref 1.7–7.7)
Neutrophils Relative %: 76 % (ref 43–77)

## 2011-02-07 LAB — COMPREHENSIVE METABOLIC PANEL
ALT: 24 U/L (ref 0–35)
AST: 24 U/L (ref 0–37)
Alkaline Phosphatase: 129 U/L — ABNORMAL HIGH (ref 39–117)
CO2: 28 mEq/L (ref 19–32)
Calcium: 9.5 mg/dL (ref 8.4–10.5)
Chloride: 101 mEq/L (ref 96–112)
GFR calc non Af Amer: >60 mL/min (ref >60)
Potassium: 3.9 mEq/L (ref 3.5–5.1)
Sodium: 140 mEq/L (ref 135–145)
Total Bilirubin: 0.2 mg/dL — ABNORMAL LOW (ref 0.3–1.2)

## 2011-02-07 LAB — CBC
Platelets: 102 10*3/uL — ABNORMAL LOW (ref 150–400)
RBC: 4.87 MIL/uL (ref 3.87–5.11)
WBC: 8.6 10*3/uL (ref 4.0–10.5)

## 2011-02-09 ENCOUNTER — Telehealth: Payer: Self-pay | Admitting: *Deleted

## 2011-02-09 NOTE — Telephone Encounter (Signed)
[facility] Triage Call Report Triage Record Num: [id number] Operator: Joneen Boers Patient Name: Alexa Woods Call Date & Time: 02/07/2011 6:25:24PM Patient Phone: [phone] PCP: Idamae Schuller A. Tower Patient Gender: Female PCP Fax : Patient DOB: 1961/01/06 Practice Name: [facility] Reason for Call: Emesis 0500, severe nausea all day. Saw Dr. Milinda Antis 02/02/11, barium swallow looked good. Chronic problem that has been deeply evaluated and not yet resolved, and which feels worse today. Denies emergent s/s but admits to 'squeezing chest pain' around her heart that has been getting worse lately. Multiple c/o pain "I don't want to keep living like this". Denies SI but is very discouraged. N/V protocol/any other cardiac s/s for more than 5 mins/EMS 911. Rn called pt back to clarify dispo, pt will call 911. Protocol(s) Used: Diabetes: Gastrointestinal Problems Protocol(s) Used: Nausea or Vomiting Recommended Outcome per Protocol: Activate EMS 911 Reason for Outcome: Known diabetic Chest discomfort associated with shortness of breath, sweating, odd heartbeats or different heart rate, nausea, vomiting, lightheadedness, or fainting lasting 5 or more minutes now or within the last hour Care Advice: ~ 02/07/2011 6:44:36PM Page 1 of 1 [id number]

## 2011-02-10 NOTE — Telephone Encounter (Signed)
Patient notified as instructed by telephone. Alexa Woods said still feels weak and nauseated. Stomach pain on and off. No chest pain since seen in ER. Alexa Woods scheduled appt with Dr Milinda Antis 02/20/11 at 3:15pm. Alexa Woods will call back if symptoms worsen or change before appt.

## 2011-02-10 NOTE — Telephone Encounter (Signed)
Dr. Milinda Antis, have you seen this note?

## 2011-02-10 NOTE — Telephone Encounter (Signed)
Thanks- I had not seen that  I did look at her ER note I recommend f/u with me if not feeling better when able

## 2011-02-12 ENCOUNTER — Other Ambulatory Visit: Payer: Self-pay

## 2011-02-12 NOTE — Telephone Encounter (Signed)
Left vm for pt to callback 

## 2011-02-12 NOTE — Telephone Encounter (Signed)
Continue taking k as she is and note change on med list Sounds like she does not need a refil until she sees me Sugar was high and platelet count low on labs in ER- will disc at f/u  If worse in the meantime - update [place] asap

## 2011-02-12 NOTE — Telephone Encounter (Signed)
Patient notified as instructed by telephone. 

## 2011-02-12 NOTE — Telephone Encounter (Signed)
[facility] faxed refill Potassium Cl XR 10 meq Cer with instructions take two capsules by mouth two times a day. In pt's med list Potassium Cl 10 meq taking 1 capsule by mouth daily. Left message at pt's home # for pt to call back. Cell phone not accepting calls now.

## 2011-02-12 NOTE — Telephone Encounter (Signed)
Pt said she has been taking Potassium 10 meq one tab daily. Pt is not out of med. Pt said she had more pain in the lower rt side last night. Pt does not want to schedule appt with 1 st available dr she will keep appt with Dr Milinda Antis on 02/20/11 unless pain worsens and then will call for appt with 1st available dr. Rock Nephew also said she had lab test done on 02/07/11 while in ER and has not heard the results yet. (Lab results are in chart review under lab tab).Please advise.

## 2011-02-20 ENCOUNTER — Ambulatory Visit (INDEPENDENT_AMBULATORY_CARE_PROVIDER_SITE_OTHER): Payer: PRIVATE HEALTH INSURANCE | Admitting: Family Medicine

## 2011-02-20 ENCOUNTER — Encounter: Payer: Self-pay | Admitting: Family Medicine

## 2011-02-20 DIAGNOSIS — R1031 Right lower quadrant pain: Secondary | ICD-10-CM

## 2011-02-20 DIAGNOSIS — Z23 Encounter for immunization: Secondary | ICD-10-CM

## 2011-02-20 DIAGNOSIS — B373 Candidiasis of vulva and vagina: Secondary | ICD-10-CM

## 2011-02-20 DIAGNOSIS — J984 Other disorders of lung: Secondary | ICD-10-CM

## 2011-02-20 DIAGNOSIS — D696 Thrombocytopenia, unspecified: Secondary | ICD-10-CM

## 2011-02-20 LAB — POCT WET PREP (WET MOUNT): Trichomonas Wet Prep HPF POC: NEGATIVE

## 2011-02-20 MED ORDER — FLUCONAZOLE 150 MG PO TABS: 150.0000 mg | ORAL_TABLET | Freq: Once | ORAL | Status: AC

## 2011-02-20 NOTE — Assessment & Plan Note (Signed)
Reviewed last visit/ ER notes/ labs and CT in detail Suspect most that this is constipation related Will work on fluids with miralax/ or MOM and stool softeners  F/u 4-6 wk or earlier if no success  Pain has already improved some

## 2011-02-20 NOTE — Assessment & Plan Note (Signed)
Platelet ct 102 in ER No symptoms/ bleed or bruising  Re check today

## 2011-02-20 NOTE — Progress Notes (Signed)
Subjective:    Patient ID: Alexa Woods, female    DOB: 02/20/1961, 50 y.o.   MRN: [id number]  HPI Pt is here for f/u of abdominal pain for which she was seen her and in ER CT was nl except for constipation with ? Patchy nodular opacity in L lung base ua neg Labs neg except for platelet 102  Dr Hetty Ely asked her to start MOM for constipation- has not had a chance to get it  Does not want to have frequent BMs at work - so she was afraid to take it   Some better  Pain is not as bad  Still lower R hand corner   Wt is up 3 lb  Had appy and ccy and partial hyst in past  Has known gastroparesis - this does make her nauseated often (from DM that is poorly controlled)  Also thinks she has yeast vaginal infection Itches Some d/c Has had before Always worse after other health prob or abx Related to her DM No worries about STDS- is not sexually active  Patient Active Problem List  Diagnoses  . HERPES ZOSTER, UNCOMPLICATED  . DIABETES MELLITUS, TYPE II  . HYPOKALEMIA  . MORBID OBESITY  . ANXIETY  . History of alcohol abuse  . DEPRESSION  . HYPERTENSION  . HEMORRHOIDS  . ALLERGIC RHINITIS  . ASTHMA  . GERD  . CYSTITIS, CHRONIC  . PSORIASIS  . PATELLO-FEMORAL SYNDROME  . EPIGASTRIC PAIN  . BACK PAIN  . DYSPHAGIA UNSPECIFIED  . Shoulder pain, left  . RLQ abdominal pain  . Thrombocytopenia  . Yeast infection involving the vagina and surrounding area   Past Medical History  Diagnosis Date  . Epigastric abdominal pain   . Alcohol abuse, unspecified   . Allergic rhinitis, cause unspecified   . Anxiety state, unspecified   . Unspecified asthma   . Backache, unspecified   . Other chronic cystitis   . Depressive disorder, not elsewhere classified   . Type II or unspecified type diabetes mellitus without mention of complication, not stated as uncontrolled   . Dysphagia, unspecified   . Esophageal reflux   . Unspecified hemorrhoids without mention of complication   .  Herpes zoster without mention of complication   . Unspecified essential hypertension   . Hypopotassemia   . Morbid obesity   . Other screening mammogram   . Pain in joint, lower leg   . Other psoriasis   . History of Bell's palsy 4/09  . Wrist fracture   . Edema   . Gastroparesis   . Myocardial infarction    Past Surgical History  Procedure Date  . Appendectomy   . Cholecystectomy   . Partial hysterectomy   . Knee arthroscopy w/ partial medial meniscectomy 10/2008    and chondroplasty  . Total knee arthroplasty   . Esophagogastroduodenoscopy 07/2002    erythematous gastropathy  . Shoulder surgery 05/2010   History  Substance Use Topics  . Smoking status: Former Smoker    Quit date: 12/30/2001  . Smokeless tobacco: Never Used  . Alcohol Use: No     former alcoholic    Family History  Problem Relation Age of Onset  . Alcohol abuse Mother   . Hypertension Mother   . Esophageal cancer Maternal Grandmother    Allergies  Allergen Reactions  . Bupropion Hcl   . Codeine     REACTION: nausea and vomiting, rash  . Lidocaine     REACTION: unknown  . Metformin  REACTION: GI  . Omeprazole     REACTION: does not work for her  . Oxycodone     Sweating and anxiety attacks  . Paroxetine     REACTION: doesn't agree  . Propoxyphene N-Acetaminophen     REACTION: wheezing  . Sulfonamide Derivatives     REACTION: rash  . Tramadol Hcl     REACTION: Causes Anxiety   Current Outpatient Prescriptions on File Prior to Visit  Medication Sig Dispense Refill  . Blood Glucose Monitoring Suppl (ONE TOUCH ULTRA SYSTEM KIT) W/DEVICE KIT 1 kit by Does not apply route as directed.        . chlorpheniramine-HYDROcodone (TUSSIONEX) 10-8 MG/5ML LQCR Take 5 mLs by mouth at bedtime as needed.  140 mL  0  . dexlansoprazole (DEXILANT) 60 MG capsule Take 1 capsule (60 mg total) by mouth daily.  30 capsule  6  . glucose blood (ONE TOUCH ULTRA TEST) test strip 1 each by Other route 2 (two) times  daily. Use as instructed       . HC-Pramox-Diet Mng Prod -Wipe (ANALPRAM ADVANCED) 2.5-1 % KIT by Combination route 2 (two) times daily as needed.        . hydrochlorothiazide 25 MG tablet Take 1 tablet (25 mg total) by mouth daily.  30 tablet  11  . insulin glargine (LANTUS SOLOSTAR) 100 UNIT/ML injection Inject 50 Units into the skin daily. In the morning, and pen needles 2/day  30 mL  12  . Insulin Pen Needle (EASY TOUCH PEN NEEDLES) 31G X 8 MM MISC by Does not apply route as directed.        Marland Kitchen L-Methylfolate (DEPLIN) 15 MG TABS Take 1 capsule by mouth daily.        . Lancets (ONETOUCH ULTRASOFT) lancets 1 each by Other route as directed. Use as instructed       . LORazepam (ATIVAN) 1 MG tablet Take 1 mg by mouth as needed.        Marland Kitchen oxyCODONE-acetaminophen (PERCOCET) 10-325 MG per tablet Take 1 tablet by mouth every 6 (six) hours as needed. For pain       . potassium chloride (KLOR-CON) 10 MEQ CR tablet Take 10 mEq by mouth daily.        . sertraline (ZOLOFT) 100 MG tablet Take 100 mg by mouth 2 (two) times daily.        Marland Kitchen tiZANidine (ZANAFLEX) 4 MG tablet Take 4 mg by mouth every 8 (eight) hours as needed. For muscle spasm       . zolpidem (AMBIEN) 10 MG tablet Take 10 mg by mouth at bedtime as needed.       Marland Kitchen albuterol (VENTOLIN HFA) 108 (90 BASE) MCG/ACT inhaler Inhale 2 puffs into the lungs every 4 (four) hours as needed.  1 Inhaler  3  . AMBULATORY NON FORMULARY MEDICATION Domperidone 10MG  take one by mouth 30 min before meals.  240 tablet  3  . aspirin 81 MG tablet Take 81 mg by mouth daily.        . fluticasone (FLONASE) 50 MCG/ACT nasal spray Place 2 sprays into the nose daily as needed.        . Fluticasone-Salmeterol (ADVAIR DISKUS) 100-50 MCG/DOSE AEPB Inhale 1 puff into the lungs every 12 (twelve) hours as needed.       . flyticasone (CUTIVATE) 0.005 % ointment 3x a day, as needed for rash  30 g  1  . Glucose Blood (BAYER BREEZE 2 TEST) DISK by  In Vitro route 4 (four) times daily -  after meals and at bedtime.        . hydrocortisone (ANUSOL-HC) 2.5 % rectal cream Place rectally daily as needed. No more than 14 days in a row       . insulin aspart (NOVOLOG FLEXPEN) 100 UNIT/ML injection Inject 5 Units into the skin daily with supper. On sliding scale      . nystatin (MYCOSTATIN) cream Apply topically as needed.       Marland Kitchen olopatadine (PATANOL) 0.1 % ophthalmic solution Place 2 drops into both eyes 3 (three) times daily as needed.         Current Facility-Administered Medications on File Prior to Visit  Medication Dose Route Frequency Provider Last Rate Last Dose  . 0.9 %  sodium chloride infusion  500 mL Intravenous Continuous Sheryn Bison, MD             Review of Systems Review of Systems  Constitutional: Negative for fever, appetite change, fatigue and unexpected weight change.  Eyes: Negative for pain and visual disturbance.  Respiratory: Negative for cough and shortness of breath.   Cardiovascular: Negative for cp or palpitations    Gastrointestinal: Negative for nausea, diarrhea and pos for constipation/ R lower abd pain  Genitourinary: Negative for urgency and frequency. pos for vaginal itching  Skin: Negative for pallor or rash   Neurological: Negative for weakness, light-headedness, numbness and headaches.  Hematological: Negative for adenopathy. Does not bruise/bleed easily.  Psychiatric/Behavioral: Negative for dysphoric mood. The patient is not nervous/anxious.          Objective:   Physical Exam  Constitutional: She appears well-developed and well-nourished. No distress.       Obese and well appearing  HENT:  Head: Normocephalic and atraumatic.  Mouth/Throat: Oropharynx is clear and moist.  Eyes: Conjunctivae and EOM are normal. Pupils are equal, round, and reactive to light. No scleral icterus.  Neck: Normal range of motion. Neck supple. No JVD present. Carotid bruit is not present. No thyromegaly present.  Cardiovascular: Normal rate,  regular rhythm, normal heart sounds and intact distal pulses.   Pulmonary/Chest: Effort normal and breath sounds normal. No respiratory distress. She has no wheezes.  Abdominal: Soft. Bowel sounds are normal. She exhibits no distension and no mass. There is tenderness.       Very mild RLQ tenderness without rebound or gaurding  Per pt - has improved quite a bit   Genitourinary: No vaginal discharge found.       Some hyperemia of labia diffusely No papules or satellite lesions No d/c Swab for wet prep obt  Musculoskeletal: Normal range of motion. She exhibits no edema and no tenderness.  Lymphadenopathy:    She has no cervical adenopathy.  Neurological: She is alert. She has normal reflexes. Coordination normal.  Skin: Skin is warm and dry. No rash noted. No erythema. No pallor.  Psychiatric: She has a normal mood and affect.          Assessment & Plan:

## 2011-02-20 NOTE — Assessment & Plan Note (Signed)
Yeast on wet prep tx with diflucan and update Adv pt to keep vaginal area as dry as possible

## 2011-02-20 NOTE — Patient Instructions (Addendum)
I think constipation could be causing or worsening your low abdominal pain and nausea/ vomiting I recommend using miralax daily (over the counter) with water until regular bowel movements , or milk of magnesia as directed  Also eat high fiber foods and drink lots of water We need to re check your platelet count since it was low in the ER Also I want to check a chest x ray on you later when you are feeling better - follow up in 4-6 weeks You have a yeast infection - take the diflucan as directed

## 2011-02-21 LAB — CBC WITH DIFFERENTIAL/PLATELET
Eosinophils Absolute: 0.1 10*3/uL (ref 0.0–0.7)
Eosinophils Relative: 0 % (ref 0–5)
Lymphs Abs: 2.7 10*3/uL (ref 0.7–4.0)
MCH: 28.1 pg (ref 26.0–34.0)
MCV: 82.4 fL (ref 78.0–100.0)
Platelets: 115 10*3/uL — ABNORMAL LOW (ref 150–400)
RBC: 4.88 MIL/uL (ref 3.87–5.11)

## 2011-02-23 DIAGNOSIS — J984 Other disorders of lung: Secondary | ICD-10-CM | POA: Insufficient documentation

## 2011-02-23 NOTE — Assessment & Plan Note (Signed)
This was found on CT and not thought to be worrisome Will disc re check at f/u No symptoms

## 2011-02-27 ENCOUNTER — Telehealth: Payer: Self-pay

## 2011-02-27 NOTE — Progress Notes (Signed)
Quick Note:  Patient notified as instructed by telephone. Pt said abdominal pain is the same. Pt will call back next week and schedule appt if still having problems and if worsens over the weekend pt will go to ER.  ______

## 2011-02-27 NOTE — Telephone Encounter (Signed)
Message copied by Patience Musca on Fri Feb 27, 2011  6:17 PM ------      Message from: Roxy Manns A      Created: Thu Feb 26, 2011  9:02 PM       Let me know if the cough becomes productive/ fever or other symptoms       Continue the miralax/ MOM/ fluids for constipation and keep me updated- especially if abd pain worsens       F/u next week if no improvement

## 2011-02-27 NOTE — Telephone Encounter (Signed)
Patient notified as instructed by telephone. Pt said the abdominal pain was the same. If pt still has symptoms next week pt will call back for appt and if worsens over weekend pt will go to ER.

## 2011-03-02 ENCOUNTER — Telehealth: Payer: Self-pay | Admitting: *Deleted

## 2011-03-02 ENCOUNTER — Other Ambulatory Visit: Payer: Self-pay | Admitting: *Deleted

## 2011-03-02 MED ORDER — POTASSIUM CHLORIDE 10 MEQ PO TBCR: 10.0000 meq | EXTENDED_RELEASE_TABLET | Freq: Every day | ORAL | Status: DC

## 2011-03-02 NOTE — Telephone Encounter (Signed)
Chart opened in error

## 2011-03-03 ENCOUNTER — Telehealth: Payer: Self-pay | Admitting: *Deleted

## 2011-03-03 NOTE — Telephone Encounter (Signed)
Check with pt as to what she is taking  Capsules or tabs are fine with me

## 2011-03-03 NOTE — Telephone Encounter (Signed)
Pt states she takes Potassium one capsule daily. Spoke with Beth at [facility] to confirm it is oK for pt to get capsules and rx should read Potassium 10 meq one capsule by mouth daily.

## 2011-03-03 NOTE — Telephone Encounter (Signed)
Received faxed refill request from [facility] for Potassium Chloride . The request is for tablets and pharmacy stated that patient has been on capsules.  Is it ok to change from capsules to tablets?  Also pharmacy states that patient has been taking 2 capsules 2 times daily, on our med list it says once daily.  Which is correct?  Please advise.

## 2011-03-04 ENCOUNTER — Ambulatory Visit (INDEPENDENT_AMBULATORY_CARE_PROVIDER_SITE_OTHER)
Admission: RE | Admit: 2011-03-04 | Discharge: 2011-03-04 | Disposition: A | Discharge status: Home / Self Care | Payer: PRIVATE HEALTH INSURANCE | Source: Ambulatory Visit | Attending: Family Medicine | Admitting: Family Medicine

## 2011-03-04 ENCOUNTER — Encounter: Payer: Self-pay | Admitting: Family Medicine

## 2011-03-04 ENCOUNTER — Ambulatory Visit (INDEPENDENT_AMBULATORY_CARE_PROVIDER_SITE_OTHER): Payer: PRIVATE HEALTH INSURANCE | Admitting: Family Medicine

## 2011-03-04 VITALS — BP 100/68 | HR 88 | Temp 97.9°F | Ht 66.0 in | Wt 247.0 lb

## 2011-03-04 DIAGNOSIS — B373 Candidiasis of vulva and vagina: Secondary | ICD-10-CM

## 2011-03-04 DIAGNOSIS — R300 Dysuria: Secondary | ICD-10-CM | POA: Insufficient documentation

## 2011-03-04 DIAGNOSIS — D72829 Elevated white blood cell count, unspecified: Secondary | ICD-10-CM

## 2011-03-04 DIAGNOSIS — R1031 Right lower quadrant pain: Secondary | ICD-10-CM

## 2011-03-04 DIAGNOSIS — R05 Cough: Secondary | ICD-10-CM

## 2011-03-04 DIAGNOSIS — D696 Thrombocytopenia, unspecified: Secondary | ICD-10-CM

## 2011-03-04 DIAGNOSIS — R059 Cough, unspecified: Secondary | ICD-10-CM | POA: Insufficient documentation

## 2011-03-04 DIAGNOSIS — J984 Other disorders of lung: Secondary | ICD-10-CM

## 2011-03-04 LAB — POCT URINALYSIS DIPSTICK
Bilirubin, UA: NEGATIVE
Leukocytes, UA: NEGATIVE
Nitrite, UA: NEGATIVE

## 2011-03-04 LAB — POCT UA - MICROSCOPIC ONLY

## 2011-03-04 MED ORDER — FLUCONAZOLE 150 MG PO TABS: 150.0000 mg | ORAL_TABLET | Freq: Once | ORAL | Status: AC

## 2011-03-04 NOTE — Assessment & Plan Note (Signed)
Found incidentally with w/u for abd pain  This improved on 2nd check to 115 No symptoms Will continue to follow

## 2011-03-04 NOTE — Assessment & Plan Note (Signed)
In diabetic improved but not totally resolved with diflucan Will repeat and continue to monitor

## 2011-03-04 NOTE — Assessment & Plan Note (Signed)
Nodular area on CT Checking cxr today in light of this and cough and leukocytosis  Will  Update Re assuring exam

## 2011-03-04 NOTE — Progress Notes (Signed)
Subjective:    Patient ID: Alexa Woods, female    DOB: 05/20/1961, 50 y.o.   MRN: [id number]  HPI Here for f/u of mildly elevated wbc/ improving thrombocytopenia/ and abd pain / and opacity seen on CT in ER  In ER prev for abd pain - saw patchy nod opacity of L lung base Then wbc inc to 11.8 Then coughing - and sneezing - mostly at night  Not coughing anything up for the most part  No fever No wheeze  Still has vaginal itching  Diflucan did not help much - but not as bad as it was  Urine always burns a little -no more frequency than usual  Platelet ct imp to 115 last check - was lower No bleeding or new bruising   abd pain - is about the same  Having bms but has to strain -- sometimes has to go back to the bathroom several times Working on her water and fiber intake   As usual stress is bad - lives with alcoholic and does not want to leave until son grad hs  She herself recovered years ago from alcoholism  Patient Active Problem List  Diagnoses  . HERPES ZOSTER, UNCOMPLICATED  . DIABETES MELLITUS, TYPE II  . HYPOKALEMIA  . MORBID OBESITY  . ANXIETY  . History of alcohol abuse  . DEPRESSION  . HYPERTENSION  . HEMORRHOIDS  . ALLERGIC RHINITIS  . ASTHMA  . GERD  . CYSTITIS, CHRONIC  . PSORIASIS  . PATELLO-FEMORAL SYNDROME  . EPIGASTRIC PAIN  . BACK PAIN  . DYSPHAGIA UNSPECIFIED  . Shoulder pain, left  . RLQ abdominal pain  . Thrombocytopenia  . Yeast infection involving the vagina and surrounding area  . Lung density on x-ray  . Leukocytosis  . Cough  . Dysuria   Past Medical History  Diagnosis Date  . Epigastric abdominal pain   . Alcohol abuse, unspecified   . Allergic rhinitis, cause unspecified   . Anxiety state, unspecified   . Unspecified asthma   . Backache, unspecified   . Other chronic cystitis   . Depressive disorder, not elsewhere classified   . Type II or unspecified type diabetes mellitus without mention of complication, not stated as  uncontrolled   . Dysphagia, unspecified   . Esophageal reflux   . Unspecified hemorrhoids without mention of complication   . Herpes zoster without mention of complication   . Unspecified essential hypertension   . Hypopotassemia   . Morbid obesity   . Other screening mammogram   . Pain in joint, lower leg   . Other psoriasis   . History of Bell's palsy 4/09  . Wrist fracture   . Edema   . Gastroparesis   . Myocardial infarction    Past Surgical History  Procedure Date  . Appendectomy   . Cholecystectomy   . Partial hysterectomy   . Knee arthroscopy w/ partial medial meniscectomy 10/2008    and chondroplasty  . Total knee arthroplasty   . Esophagogastroduodenoscopy 07/2002    erythematous gastropathy  . Shoulder surgery 05/2010   History  Substance Use Topics  . Smoking status: Former Smoker    Quit date: 12/30/2001  . Smokeless tobacco: Never Used  . Alcohol Use: No     former alcoholic    Family History  Problem Relation Age of Onset  . Alcohol abuse Mother   . Hypertension Mother   . Esophageal cancer Maternal Grandmother    Allergies  Allergen Reactions  .  Bupropion Hcl   . Codeine     REACTION: nausea and vomiting, rash  . Lidocaine     REACTION: unknown  . Metformin     REACTION: GI  . Omeprazole     REACTION: does not work for her  . Oxycodone     Sweating and anxiety attacks  . Paroxetine     REACTION: doesn't agree  . Propoxyphene N-Acetaminophen     REACTION: wheezing  . Sulfonamide Derivatives     REACTION: rash  . Tramadol Hcl     REACTION: Causes Anxiety   Current Outpatient Prescriptions on File Prior to Visit  Medication Sig Dispense Refill  . albuterol (VENTOLIN HFA) 108 (90 BASE) MCG/ACT inhaler Inhale 2 puffs into the lungs every 4 (four) hours as needed.  1 Inhaler  3  . Blood Glucose Monitoring Suppl (ONE TOUCH ULTRA SYSTEM KIT) W/DEVICE KIT 1 kit by Does not apply route as directed.        Marland Kitchen dexlansoprazole (DEXILANT) 60 MG  capsule Take 1 capsule (60 mg total) by mouth daily.  30 capsule  6  . glucose blood (ONE TOUCH ULTRA TEST) test strip 1 each by Other route 2 (two) times daily. Use as instructed       . hydrochlorothiazide 25 MG tablet Take 1 tablet (25 mg total) by mouth daily.  30 tablet  11  . insulin glargine (LANTUS SOLOSTAR) 100 UNIT/ML injection Inject 50 Units into the skin daily. In the morning, and pen needles 2/day  30 mL  12  . Insulin Pen Needle (EASY TOUCH PEN NEEDLES) 31G X 8 MM MISC by Does not apply route as directed.        Marland Kitchen L-Methylfolate (DEPLIN) 15 MG TABS Take 1 capsule by mouth daily.        . Lancets (ONETOUCH ULTRASOFT) lancets 1 each by Other route as directed. Use as instructed       . LORazepam (ATIVAN) 1 MG tablet Take 1 mg by mouth as needed.        Marland Kitchen oxyCODONE-acetaminophen (PERCOCET) 10-325 MG per tablet Take 1 tablet by mouth every 6 (six) hours as needed. For pain       . sertraline (ZOLOFT) 100 MG tablet Take 100 mg by mouth 2 (two) times daily.        Marland Kitchen tiZANidine (ZANAFLEX) 4 MG tablet Take 4 mg by mouth every 8 (eight) hours as needed. For muscle spasm       . zolpidem (AMBIEN) 10 MG tablet Take 15 mg by mouth at bedtime as needed.       . AMBULATORY NON FORMULARY MEDICATION Domperidone 10MG  take one by mouth 30 min before meals.  240 tablet  3  . aspirin 81 MG tablet Take 81 mg by mouth daily.        . chlorpheniramine-HYDROcodone (TUSSIONEX) 10-8 MG/5ML LQCR Take 5 mLs by mouth at bedtime as needed.  140 mL  0  . fluticasone (FLONASE) 50 MCG/ACT nasal spray Place 2 sprays into the nose daily as needed.        . Fluticasone-Salmeterol (ADVAIR DISKUS) 100-50 MCG/DOSE AEPB Inhale 1 puff into the lungs every 12 (twelve) hours as needed.       . flyticasone (CUTIVATE) 0.005 % ointment 3x a day, as needed for rash  30 g  1  . Glucose Blood (BAYER BREEZE 2 TEST) DISK by In Vitro route 4 (four) times daily - after meals and at bedtime.        Marland Kitchen  HC-Pramox-Diet Mng Prod -Wipe  (ANALPRAM ADVANCED) 2.5-1 % KIT by Combination route 2 (two) times daily as needed.        . hydrocortisone (ANUSOL-HC) 2.5 % rectal cream Place rectally daily as needed. No more than 14 days in a row       . insulin aspart (NOVOLOG FLEXPEN) 100 UNIT/ML injection Inject 5 Units into the skin daily with supper. On sliding scale      . nystatin (MYCOSTATIN) cream Apply topically as needed.       Marland Kitchen olopatadine (PATANOL) 0.1 % ophthalmic solution Place 2 drops into both eyes 3 (three) times daily as needed.         Current Facility-Administered Medications on File Prior to Visit  Medication Dose Route Frequency Provider Last Rate Last Dose  . 0.9 %  sodium chloride infusion  500 mL Intravenous Continuous Sheryn Bison, MD          Review of Systems Review of Systems  Constitutional: Negative for fever, appetite change, fatigue and unexpected weight change.  Eyes: Negative for pain and visual disturbance.  Respiratory: Negative for sob or wheeze/ pos for cough  Cardiovascular: Negative for cp or palpitations    Gastrointestinal: Negative for nausea, diarrhea and constipation.pos for abd pain / soreness   Genitourinary: Negative for urgency and frequency. pos for dysuria, no hematuria  Skin: Negative for pallor or rash   Neurological: Negative for weakness, light-headedness, numbness and headaches.  Hematological: Negative for adenopathy. Does not bruise/bleed easily.  Psychiatric/Behavioral: Negative for dysphoric mood. The patient is not nervous/anxious.          Objective:   Physical Exam  Constitutional: She appears well-developed and well-nourished. No distress.       overwt and well appearing   HENT:  Head: Normocephalic and atraumatic.  Right Ear: External ear normal.  Left Ear: External ear normal.  Nose: Nose normal.  Mouth/Throat: Oropharynx is clear and moist.  Eyes: Conjunctivae and EOM are normal. Pupils are equal, round, and reactive to light. Right eye exhibits no  discharge. Left eye exhibits no discharge. No scleral icterus.  Neck: Normal range of motion. Neck supple. No JVD present. Carotid bruit is not present. No thyromegaly present.  Cardiovascular: Normal rate, regular rhythm, normal heart sounds and intact distal pulses.  Exam reveals no gallop.   Pulmonary/Chest: Effort normal and breath sounds normal. No respiratory distress. She has no wheezes. She has no rales. She exhibits no tenderness.  Abdominal: Soft. Bowel sounds are normal. She exhibits no distension, no abdominal bruit and no mass. There is tenderness. There is no rebound and no guarding.       Epigastric and RLQ tenderness -is mild No rebound or gaurding   Genitourinary:       No suprapubic tenderness  Musculoskeletal: Normal range of motion. She exhibits no edema and no tenderness.  Lymphadenopathy:    She has no cervical adenopathy.  Neurological: She is alert. She has normal reflexes. No cranial nerve deficit. Coordination normal.  Skin: Skin is warm and dry. No rash noted. No erythema. No pallor.  Psychiatric: She has a normal mood and affect.          Assessment & Plan:

## 2011-03-04 NOTE — Assessment & Plan Note (Signed)
Not bad and no reactive airways In light of wbc inc and recent CT finding of LLL nodular area - checking a cxr - will update with result

## 2011-03-04 NOTE — Assessment & Plan Note (Signed)
Very slowly improving with more BMs and improvement in constipation  Will continue to follow

## 2011-03-04 NOTE — Patient Instructions (Addendum)
Update me if fever or any other change in symptoms  Keep working on constipation and drinking lots of water  Try diflucan one more time and let me know if yeast infection does not improve in 1 week (I sent it to [facility])  I will update you with chest x ray report when it returns  Schedule non fasting labs in 2 weeks (for cbc)

## 2011-03-04 NOTE — Assessment & Plan Note (Addendum)
Some cough/ some dysuria  cxr today- pend report ua neg  No fever or other symptoms Is stressed  abd pain continues

## 2011-03-04 NOTE — Assessment & Plan Note (Addendum)
With leukocytosis  ua today neg This is fairly chronic  ? If poss related to vaginitis

## 2011-03-05 ENCOUNTER — Other Ambulatory Visit: Payer: Self-pay | Admitting: *Deleted

## 2011-03-05 MED ORDER — POTASSIUM CHLORIDE 10 MEQ PO TBCR: 10.0000 meq | EXTENDED_RELEASE_TABLET | Freq: Every day | ORAL | Status: DC

## 2011-03-17 ENCOUNTER — Other Ambulatory Visit (INDEPENDENT_AMBULATORY_CARE_PROVIDER_SITE_OTHER): Payer: PRIVATE HEALTH INSURANCE

## 2011-03-17 DIAGNOSIS — D696 Thrombocytopenia, unspecified: Secondary | ICD-10-CM

## 2011-03-17 DIAGNOSIS — D72829 Elevated white blood cell count, unspecified: Secondary | ICD-10-CM

## 2011-03-17 DIAGNOSIS — D72819 Decreased white blood cell count, unspecified: Secondary | ICD-10-CM

## 2011-03-17 LAB — CBC WITH DIFFERENTIAL/PLATELET
Eosinophils Absolute: 0.1 10*3/uL (ref 0.0–0.7)
Eosinophils Relative: 0.6 % (ref 0.0–5.0)
HCT: 39.3 % (ref 36.0–46.0)
Lymphs Abs: 2.2 10*3/uL (ref 0.7–4.0)
MCHC: 34.1 g/dL (ref 30.0–36.0)
MCV: 83.7 fl (ref 78.0–100.0)
Monocytes Absolute: 0.4 10*3/uL (ref 0.1–1.0)
Neutrophils Relative %: 69.1 % (ref 43.0–77.0)
Platelets: 140 10*3/uL — ABNORMAL LOW (ref 150.0–400.0)
RDW: 12.9 % (ref 11.5–14.6)

## 2011-04-02 ENCOUNTER — Ambulatory Visit (INDEPENDENT_AMBULATORY_CARE_PROVIDER_SITE_OTHER): Payer: PRIVATE HEALTH INSURANCE | Admitting: Family Medicine

## 2011-04-02 ENCOUNTER — Encounter: Payer: Self-pay | Admitting: Family Medicine

## 2011-04-02 VITALS — BP 106/72 | HR 83 | Temp 98.8°F | Ht 66.0 in | Wt 244.0 lb

## 2011-04-02 DIAGNOSIS — J069 Acute upper respiratory infection, unspecified: Secondary | ICD-10-CM

## 2011-04-02 MED ORDER — AMOXICILLIN 500 MG PO CAPS: 500.0000 mg | ORAL_CAPSULE | Freq: Two times a day (BID) | ORAL | Status: AC

## 2011-04-02 MED ORDER — BENZONATATE 100 MG PO CAPS: 100.0000 mg | ORAL_CAPSULE | Freq: Four times a day (QID) | ORAL | Status: DC | PRN

## 2011-04-02 MED ORDER — ALBUTEROL SULFATE HFA 108 (90 BASE) MCG/ACT IN AERS: 2.0000 | INHALATION_SPRAY | RESPIRATORY_TRACT | Status: DC | PRN

## 2011-04-02 NOTE — Patient Instructions (Signed)
Take antibiotic as directed.  Drink lots of fluids.  Treat sympotmatically with Mucinex, nasal saline irrigation, and Tylenol/Ibuprofen.Cough suppressant at night. Call if not improving as expected in 5-7 days.    

## 2011-04-02 NOTE — Progress Notes (Signed)
SUBJECTIVE:  Alexa Woods is a 50 y.o. female who complains of coryza, congestion, sneezing, sore throat, post nasal drip, myalgias, headache and bilateral sinus pain for 10 days. She denies a history of anorexia, chest pain, dizziness, shortness of breath and vomiting and admits to a history of asthma.   Patient Active Problem List  Diagnoses  . HERPES ZOSTER, UNCOMPLICATED  . DIABETES MELLITUS, TYPE II  . HYPOKALEMIA  . MORBID OBESITY  . ANXIETY  . History of alcohol abuse  . DEPRESSION  . HYPERTENSION  . HEMORRHOIDS  . ALLERGIC RHINITIS  . ASTHMA  . GERD  . CYSTITIS, CHRONIC  . PSORIASIS  . PATELLO-FEMORAL SYNDROME  . EPIGASTRIC PAIN  . BACK PAIN  . DYSPHAGIA UNSPECIFIED  . Shoulder pain, left  . RLQ abdominal pain  . Thrombocytopenia  . Yeast infection involving the vagina and surrounding area  . Lung density on x-ray  . Leukocytosis  . Cough  . Dysuria   Past Medical History  Diagnosis Date  . Epigastric abdominal pain   . Alcohol abuse, unspecified   . Allergic rhinitis, cause unspecified   . Anxiety state, unspecified   . Unspecified asthma   . Backache, unspecified   . Other chronic cystitis   . Depressive disorder, not elsewhere classified   . Type II or unspecified type diabetes mellitus without mention of complication, not stated as uncontrolled   . Dysphagia, unspecified   . Esophageal reflux   . Unspecified hemorrhoids without mention of complication   . Herpes zoster without mention of complication   . Unspecified essential hypertension   . Hypopotassemia   . Morbid obesity   . Other screening mammogram   . Pain in joint, lower leg   . Other psoriasis   . History of Bell's palsy 4/09  . Wrist fracture   . Edema   . Gastroparesis   . Myocardial infarction    Past Surgical History  Procedure Date  . Appendectomy   . Cholecystectomy   . Partial hysterectomy   . Knee arthroscopy w/ partial medial meniscectomy 10/2008    and chondroplasty  .  Total knee arthroplasty   . Esophagogastroduodenoscopy 07/2002    erythematous gastropathy  . Shoulder surgery 05/2010   History  Substance Use Topics  . Smoking status: Former Smoker    Quit date: 12/30/2001  . Smokeless tobacco: Never Used  . Alcohol Use: No     former alcoholic    Family History  Problem Relation Age of Onset  . Alcohol abuse Mother   . Hypertension Mother   . Esophageal cancer Maternal Grandmother    Allergies  Allergen Reactions  . Bupropion Hcl   . Codeine     REACTION: nausea and vomiting, rash  . Lidocaine     REACTION: unknown  . Metformin     REACTION: GI  . Omeprazole     REACTION: does not work for her  . Oxycodone     Sweating and anxiety attacks  . Paroxetine     REACTION: doesn't agree  . Propoxyphene N-Acetaminophen     REACTION: wheezing  . Sulfonamide Derivatives     REACTION: rash  . Tramadol Hcl     REACTION: Causes Anxiety   Current Outpatient Prescriptions on File Prior to Visit  Medication Sig Dispense Refill  . albuterol (VENTOLIN HFA) 108 (90 BASE) MCG/ACT inhaler Inhale 2 puffs into the lungs every 4 (four) hours as needed.  1 Inhaler  3  . AMBULATORY  NON FORMULARY MEDICATION Domperidone 10MG  take one by mouth 30 min before meals.  240 tablet  3  . aspirin 81 MG tablet Take 81 mg by mouth daily.        . Blood Glucose Monitoring Suppl (ONE TOUCH ULTRA SYSTEM KIT) W/DEVICE KIT 1 kit by Does not apply route as directed.        . chlorpheniramine-HYDROcodone (TUSSIONEX) 10-8 MG/5ML LQCR Take 5 mLs by mouth at bedtime as needed.  140 mL  0  . dexlansoprazole (DEXILANT) 60 MG capsule Take 1 capsule (60 mg total) by mouth daily.  30 capsule  6  . fluticasone (FLONASE) 50 MCG/ACT nasal spray Place 2 sprays into the nose daily as needed.        . Fluticasone-Salmeterol (ADVAIR DISKUS) 100-50 MCG/DOSE AEPB Inhale 1 puff into the lungs every 12 (twelve) hours as needed.       . flyticasone (CUTIVATE) 0.005 % ointment 3x a day, as  needed for rash  30 g  1  . Glucose Blood (BAYER BREEZE 2 TEST) DISK by In Vitro route 4 (four) times daily - after meals and at bedtime.        Marland Kitchen glucose blood (ONE TOUCH ULTRA TEST) test strip 1 each by Other route 2 (two) times daily. Use as instructed       . HC-Pramox-Diet Mng Prod -Wipe (ANALPRAM ADVANCED) 2.5-1 % KIT by Combination route 2 (two) times daily as needed.        . hydrochlorothiazide 25 MG tablet Take 1 tablet (25 mg total) by mouth daily.  30 tablet  11  . hydrocortisone (ANUSOL-HC) 2.5 % rectal cream Place rectally daily as needed. No more than 14 days in a row       . insulin aspart (NOVOLOG FLEXPEN) 100 UNIT/ML injection Inject 5 Units into the skin daily with supper. On sliding scale      . insulin glargine (LANTUS SOLOSTAR) 100 UNIT/ML injection Inject 50 Units into the skin daily. In the morning, and pen needles 2/day  30 mL  12  . Insulin Pen Needle (EASY TOUCH PEN NEEDLES) 31G X 8 MM MISC by Does not apply route as directed.        Marland Kitchen L-Methylfolate (DEPLIN) 15 MG TABS Take 1 capsule by mouth daily.        . Lancets (ONETOUCH ULTRASOFT) lancets 1 each by Other route as directed. Use as instructed       . LORazepam (ATIVAN) 1 MG tablet Take 1 mg by mouth as needed.        . nystatin (MYCOSTATIN) cream Apply topically as needed.       Marland Kitchen olopatadine (PATANOL) 0.1 % ophthalmic solution Place 2 drops into both eyes 3 (three) times daily as needed.        Marland Kitchen oxyCODONE-acetaminophen (PERCOCET) 10-325 MG per tablet Take 1 tablet by mouth every 6 (six) hours as needed. For pain       . potassium chloride (KLOR-CON) 10 MEQ CR tablet Take 1 tablet (10 mEq total) by mouth daily.  60 tablet  6  . sertraline (ZOLOFT) 100 MG tablet Take 100 mg by mouth 2 (two) times daily.        Marland Kitchen tiZANidine (ZANAFLEX) 4 MG tablet Take 4 mg by mouth every 8 (eight) hours as needed. For muscle spasm       . zolpidem (AMBIEN) 10 MG tablet Take 15 mg by mouth at bedtime as needed.  Current  Facility-Administered Medications on File Prior to Visit  Medication Dose Route Frequency Provider Last Rate Last Dose  . 0.9 %  sodium chloride infusion  500 mL Intravenous Continuous Sheryn Bison, MD       The PMH, PSH, Social History, Family History, Medications, and allergies have been reviewed in [facility], and have been updated if relevant.  OBJECTIVE: BP 106/72  Pulse 83  Temp(Src) 98.8 F (37.1 C) (Oral)  Ht 5\' 6"  (1.676 m)  Wt 244 lb (110.678 kg)  BMI 39.38 kg/m2  She appears well, vital signs are as noted. Ears normal.  Throat and pharynx normal.  Neck supple. No adenopathy in the neck. Nose is congested. Sinuses TTP throughout, faint exp wheezes ASSESSMENT:  sinusitis and bronchitis  PLAN: Given duration and progression of symptoms, will treat for bacterial sinusitis. Symptomatic therapy suggested: push fluids, rest and return office visit prn if symptoms persist or worsen.  Call or return to clinic prn if these symptoms worsen or fail to improve as anticipated.

## 2011-05-04 ENCOUNTER — Ambulatory Visit (INDEPENDENT_AMBULATORY_CARE_PROVIDER_SITE_OTHER): Payer: PRIVATE HEALTH INSURANCE | Admitting: Family Medicine

## 2011-05-04 ENCOUNTER — Encounter: Payer: Self-pay | Admitting: Family Medicine

## 2011-05-04 VITALS — BP 106/70 | HR 76 | Temp 98.1°F | Ht 66.0 in | Wt 247.2 lb

## 2011-05-04 DIAGNOSIS — J340 Abscess, furuncle and carbuncle of nose: Secondary | ICD-10-CM

## 2011-05-04 DIAGNOSIS — L0201 Cutaneous abscess of face: Secondary | ICD-10-CM

## 2011-05-04 MED ORDER — MUPIROCIN 2 % EX OINT: TOPICAL_OINTMENT | CUTANEOUS | Status: DC

## 2011-05-04 NOTE — Assessment & Plan Note (Signed)
Very mild at this point - after a viral uri R nostril worse with some scabbing  Will try bactroban ointment bid to each nostril 10 d If any worsening would not hesitate to add oral abx  Disc hygiene , and imp of not touching nose/ good hand washing  Update if not resolved after the course

## 2011-05-04 NOTE — Patient Instructions (Signed)
Use the bactroban ointment in each nostril twice daily for 10 days  I sent this to [facility] If not improved after you finish your medicine let me know call earlier if you get worse Use a vaporizer in bedroom to help dry air

## 2011-05-04 NOTE — Progress Notes (Signed)
Subjective:    Patient ID: Alexa Woods, female    DOB: [birth year], 50 y.o.   MRN: [id number]  HPI Thinks she may have MRSA in nose   Nose was very runny last week and became very irritated  By Friday -- was painful- both nostrils ? Papule under L eye too  Has had MRSA in nose in past - tx with abx oral and topical (3-4 years ago)   Has a cold - a little cough  A little sore in her throat   Patient Active Problem List  Diagnoses  . HERPES ZOSTER, UNCOMPLICATED  . DIABETES MELLITUS, TYPE II  . HYPOKALEMIA  . MORBID OBESITY  . ANXIETY  . History of alcohol abuse  . DEPRESSION  . HYPERTENSION  . HEMORRHOIDS  . ALLERGIC RHINITIS  . ASTHMA  . GERD  . CYSTITIS, CHRONIC  . PSORIASIS  . PATELLO-FEMORAL SYNDROME  . EPIGASTRIC PAIN  . BACK PAIN  . DYSPHAGIA UNSPECIFIED  . Shoulder pain, left  . RLQ abdominal pain  . Thrombocytopenia  . Yeast infection involving the vagina and surrounding area  . Lung density on x-ray  . Leukocytosis  . Cough  . Dysuria  . Cellulitis of nostril   Past Medical History  Diagnosis Date  . Epigastric abdominal pain   . Alcohol abuse, unspecified   . Allergic rhinitis, cause unspecified   . Anxiety state, unspecified   . Unspecified asthma   . Backache, unspecified   . Other chronic cystitis   . Depressive disorder, not elsewhere classified   . Type II or unspecified type diabetes mellitus without mention of complication, not stated as uncontrolled   . Dysphagia, unspecified   . Esophageal reflux   . Unspecified hemorrhoids without mention of complication   . Herpes zoster without mention of complication   . Unspecified essential hypertension   . Hypopotassemia   . Morbid obesity   . Other screening mammogram   . Pain in joint, lower leg   . Other psoriasis   . History of Bell's palsy 4/09  . Wrist fracture   . Edema   . Gastroparesis   . Myocardial infarction    Past Surgical History  Procedure Date  . Appendectomy   .  Cholecystectomy   . Partial hysterectomy   . Knee arthroscopy w/ partial medial meniscectomy 10/2008    and chondroplasty  . Total knee arthroplasty   . Esophagogastroduodenoscopy 07/2002    erythematous gastropathy  . Shoulder surgery 05/2010   History  Substance Use Topics  . Smoking status: Former Smoker    Quit date: 12/30/2001  . Smokeless tobacco: Never Used  . Alcohol Use: No     former alcoholic    Family History  Problem Relation Age of Onset  . Alcohol abuse Mother   . Hypertension Mother   . Esophageal cancer Maternal Grandmother    Allergies  Allergen Reactions  . Bupropion Hcl   . Codeine     REACTION: nausea and vomiting, rash  . Lidocaine     REACTION: unknown  . Metformin     REACTION: GI  . Omeprazole     REACTION: does not work for her  . Oxycodone     Sweating and anxiety attacks  . Paroxetine     REACTION: doesn't agree  . Propoxyphene N-Acetaminophen     REACTION: wheezing  . Sulfonamide Derivatives     REACTION: rash  . Tramadol Hcl     REACTION: Causes Anxiety  Current Outpatient Prescriptions on File Prior to Visit  Medication Sig Dispense Refill  . aspirin 81 MG tablet Take 81 mg by mouth daily.        . Blood Glucose Monitoring Suppl (ONE TOUCH ULTRA SYSTEM KIT) W/DEVICE KIT 1 kit by Does not apply route as directed.        Marland Kitchen dexlansoprazole (DEXILANT) 60 MG capsule Take 1 capsule (60 mg total) by mouth daily.  30 capsule  6  . glucose blood (ONE TOUCH ULTRA TEST) test strip 1 each by Other route 2 (two) times daily. Use as instructed       . HC-Pramox-Diet Mng Prod -Wipe (ANALPRAM ADVANCED) 2.5-1 % KIT by Combination route 2 (two) times daily as needed.        . hydrochlorothiazide 25 MG tablet Take 1 tablet (25 mg total) by mouth daily.  30 tablet  11  . hydrocortisone (ANUSOL-HC) 2.5 % rectal cream Place rectally daily as needed. No more than 14 days in a row       . Insulin Pen Needle (EASY TOUCH PEN NEEDLES) 31G X 8 MM MISC by Does  not apply route as directed.        Marland Kitchen L-Methylfolate (DEPLIN) 15 MG TABS Take 1 capsule by mouth daily.        . Lancets (ONETOUCH ULTRASOFT) lancets 1 each by Other route as directed. Use as instructed       . LORazepam (ATIVAN) 1 MG tablet Take 1 mg by mouth as needed.        Marland Kitchen oxyCODONE-acetaminophen (PERCOCET) 10-325 MG per tablet Take 1 tablet by mouth every 6 (six) hours as needed. For pain       . potassium chloride (KLOR-CON) 10 MEQ CR tablet Take 1 tablet (10 mEq total) by mouth daily.  60 tablet  6  . sertraline (ZOLOFT) 100 MG tablet Take 100 mg by mouth 2 (two) times daily.        Marland Kitchen tiZANidine (ZANAFLEX) 4 MG tablet Take 4 mg by mouth every 8 (eight) hours as needed. For muscle spasm       . zolpidem (AMBIEN) 10 MG tablet Take 15 mg by mouth at bedtime as needed.       Marland Kitchen albuterol (VENTOLIN HFA) 108 (90 BASE) MCG/ACT inhaler Inhale 2 puffs into the lungs every 4 (four) hours as needed.  1 Inhaler  3  . AMBULATORY NON FORMULARY MEDICATION Domperidone 10MG  take one by mouth 30 min before meals.  240 tablet  3  . benzonatate (TESSALON PERLES) 100 MG capsule Take 1 capsule (100 mg total) by mouth every 6 (six) hours as needed for cough.  30 capsule  0  . chlorpheniramine-HYDROcodone (TUSSIONEX) 10-8 MG/5ML LQCR Take 5 mLs by mouth at bedtime as needed.  140 mL  0  . fluticasone (FLONASE) 50 MCG/ACT nasal spray Place 2 sprays into the nose daily as needed.        . Fluticasone-Salmeterol (ADVAIR DISKUS) 100-50 MCG/DOSE AEPB Inhale 1 puff into the lungs every 12 (twelve) hours as needed.       . flyticasone (CUTIVATE) 0.005 % ointment 3x a day, as needed for rash  30 g  1  . insulin aspart (NOVOLOG FLEXPEN) 100 UNIT/ML injection Inject 5 Units into the skin daily with supper. On sliding scale      . nystatin (MYCOSTATIN) cream Apply topically as needed.       Marland Kitchen olopatadine (PATANOL) 0.1 % ophthalmic solution Place 2 drops  into both eyes 3 (three) times daily as needed.         Current  Facility-Administered Medications on File Prior to Visit  Medication Dose Route Frequency Provider Last Rate Last Dose  . 0.9 %  sodium chloride infusion  500 mL Intravenous Continuous Sheryn Bison, MD         Review of Systems Review of Systems  Constitutional: Negative for fever, appetite change, fatigue and unexpected weight change.  Eyes: Negative for pain and visual disturbance.  ENT neg for ear pain or sinus pain  Respiratory: Negative for cough and shortness of breath.   Cardiovascular: Negative for cp or palpitations    Gastrointestinal: Negative for nausea, diarrhea and constipation.  Genitourinary: Negative for urgency and frequency.  Skin: Negative for pallor and pos for redness/ rash Neurological: Negative for weakness, light-headedness, numbness and headaches.  Hematological: Negative for adenopathy. Does not bruise/bleed easily.  Psychiatric/Behavioral: Negative for dysphoric mood. The patient is not nervous/anxious.          Objective:   Physical Exam  Constitutional: She appears well-developed and well-nourished. No distress.       Obese and well appearing   HENT:  Head: Normocephalic and atraumatic.  Right Ear: External ear normal.  Left Ear: External ear normal.  Mouth/Throat: Oropharynx is clear and moist.       Scabbing and erythema of both nostrils/ worse on L with some crusting  Small pimple noted in L nare also  Mildly tender Boggy mucosa   Eyes: Conjunctivae and EOM are normal. Pupils are equal, round, and reactive to light. Right eye exhibits no discharge. Left eye exhibits no discharge.  Neck: Normal range of motion. Neck supple. No thyromegaly present.  Cardiovascular: Normal rate, regular rhythm and normal heart sounds.   Pulmonary/Chest: Effort normal and breath sounds normal. No respiratory distress. She has no wheezes.  Lymphadenopathy:    She has no cervical adenopathy.  Neurological: She is alert.  Skin: Skin is warm and dry. There is  erythema. No pallor.       Seen HENT eval   Psychiatric: She has a normal mood and affect.          Assessment & Plan:

## 2011-05-05 ENCOUNTER — Ambulatory Visit: Payer: PRIVATE HEALTH INSURANCE | Admitting: Family Medicine

## 2011-05-13 ENCOUNTER — Encounter: Payer: Self-pay | Admitting: Family Medicine

## 2011-05-13 ENCOUNTER — Ambulatory Visit (INDEPENDENT_AMBULATORY_CARE_PROVIDER_SITE_OTHER): Payer: PRIVATE HEALTH INSURANCE | Admitting: Family Medicine

## 2011-05-13 VITALS — BP 100/60 | HR 76 | Temp 97.9°F | Ht 66.0 in | Wt 249.0 lb

## 2011-05-13 DIAGNOSIS — R300 Dysuria: Secondary | ICD-10-CM

## 2011-05-13 DIAGNOSIS — E119 Type 2 diabetes mellitus without complications: Secondary | ICD-10-CM

## 2011-05-13 LAB — POCT URINALYSIS DIPSTICK
Ketones, UA: NEGATIVE
Leukocytes, UA: NEGATIVE
Urobilinogen, UA: 0.2
pH, UA: 6

## 2011-05-13 MED ORDER — PHENAZOPYRIDINE HCL 200 MG PO TABS: 200.0000 mg | ORAL_TABLET | Freq: Three times a day (TID) | ORAL | Status: DC | PRN

## 2011-05-13 NOTE — Assessment & Plan Note (Signed)
Spilling glucose in the urine. Discussed this being at least 180 or higher. Be more careful with sweets and carbs.

## 2011-05-13 NOTE — Patient Instructions (Signed)
Take Pyridium for the next few days.

## 2011-05-13 NOTE — Progress Notes (Signed)
  Subjective:    Patient ID: Alexa Woods, female    DOB: [birth year], 50 y.o.   MRN: [id number]  HPI Pt here as acute appt for pain with urination and vaginal itchng. She was seen for mild nasal cellulitis the end of last month, treated topically with Mupiricin.  She has itching and burning with feeling of difficulty urinating. Sometimes has this during but sometimes after urinating. She has also had vaginal itching but no discernible discharge. No fever or chills. She last had intercourse Mon nite. She had sxs before that. They did not get appreciably worse thereafter. She is drinking lots of fluids, is thirsty all the time with her diabetes.  Review of SystemsNoncontributory except as above.       Objective:   Physical Exam  Constitutional: She appears well-developed and well-nourished. No distress.  HENT:  Head: Normocephalic and atraumatic.  Right Ear: External ear normal.  Left Ear: External ear normal.  Nose: Nose normal.  Mouth/Throat: Oropharynx is clear and moist. No oropharyngeal exudate.  Eyes: Conjunctivae and EOM are normal. Pupils are equal, round, and reactive to light.  Neck: Normal range of motion. Neck supple. No thyromegaly present.  Cardiovascular: Normal rate, regular rhythm and normal heart sounds.   Pulmonary/Chest: Effort normal and breath sounds normal. She has no wheezes. She has no rales.  Abdominal:       Minimal suprapubic tenderness.  Musculoskeletal:       No CVAT.  Lymphadenopathy:    She has no cervical adenopathy.  Skin: She is not diaphoretic.          Assessment & Plan:

## 2011-05-13 NOTE — Assessment & Plan Note (Signed)
Recurrent burning with urination. Dip and micro do not look suspicious. Will treat with Pyridium and culture urine. If she develops discharge would treat for vaginal candidiasis with OTC Monistat per label for minimum of one week. Will report U/C via phone tree.

## 2011-05-15 LAB — URINE CULTURE

## 2011-05-22 ENCOUNTER — Encounter: Payer: Self-pay | Admitting: Family Medicine

## 2011-05-22 ENCOUNTER — Ambulatory Visit (INDEPENDENT_AMBULATORY_CARE_PROVIDER_SITE_OTHER)
Admission: RE | Admit: 2011-05-22 | Discharge: 2011-05-22 | Disposition: A | Discharge status: Home / Self Care | Payer: PRIVATE HEALTH INSURANCE | Source: Ambulatory Visit | Attending: Family Medicine | Admitting: Family Medicine

## 2011-05-22 ENCOUNTER — Ambulatory Visit (INDEPENDENT_AMBULATORY_CARE_PROVIDER_SITE_OTHER): Payer: PRIVATE HEALTH INSURANCE | Admitting: Family Medicine

## 2011-05-22 VITALS — BP 118/70 | HR 83 | Temp 98.0°F | Ht 66.0 in | Wt 248.0 lb

## 2011-05-22 DIAGNOSIS — M79609 Pain in unspecified limb: Secondary | ICD-10-CM

## 2011-05-22 DIAGNOSIS — M79672 Pain in left foot: Secondary | ICD-10-CM

## 2011-05-22 DIAGNOSIS — M79673 Pain in unspecified foot: Secondary | ICD-10-CM

## 2011-05-22 NOTE — Patient Instructions (Signed)
Plantar fasciitis.  Wear supportive shoes and use soft inserts that have some arch support.  Stretch your foot each time before you stand.  If it isn't getting better, then go see ortho.  Take care.

## 2011-05-22 NOTE — Progress Notes (Signed)
Had seen [facility] ortho about back but now with L heel pain.  Known spur on L heel.  Plantar pain.  Pain with first step in AM, then is eases up some later in the day.  Overall, it's been gradually getting worse.  No trauma.  Inc in pain if standing after sitting.    Meds, vitals, and allergies reviewed.   ROS: See HPI.  Otherwise, noncontributory.  nad Overweight L ankle and foot with normal inspection, med/lat mal not ttp L plantar pain at origin of plantar fascia Distally with normal cap refill

## 2011-05-24 DIAGNOSIS — M79673 Pain in unspecified foot: Secondary | ICD-10-CM | POA: Insufficient documentation

## 2011-05-24 NOTE — Assessment & Plan Note (Signed)
X rays reviewed.  Plantar fasciitis, stretch, arch supports, and then f/u with ortho prn.  D/w pt about anatomy.

## 2011-06-18 ENCOUNTER — Telehealth: Payer: Self-pay | Admitting: Family Medicine

## 2011-06-18 ENCOUNTER — Encounter: Payer: Self-pay | Admitting: *Deleted

## 2011-06-18 ENCOUNTER — Ambulatory Visit (INDEPENDENT_AMBULATORY_CARE_PROVIDER_SITE_OTHER): Payer: PRIVATE HEALTH INSURANCE | Admitting: Family Medicine

## 2011-06-18 ENCOUNTER — Encounter: Payer: Self-pay | Admitting: Family Medicine

## 2011-06-18 DIAGNOSIS — J209 Acute bronchitis, unspecified: Secondary | ICD-10-CM

## 2011-06-18 DIAGNOSIS — J45901 Unspecified asthma with (acute) exacerbation: Secondary | ICD-10-CM

## 2011-06-18 MED ORDER — HYDROCOD POLST-CHLORPHEN POLST 10-8 MG/5ML PO LQCR: 5.0000 mL | Freq: Every evening | ORAL | Status: DC | PRN

## 2011-06-18 MED ORDER — LEVOFLOXACIN 500 MG PO TABS: 500.0000 mg | ORAL_TABLET | Freq: Every day | ORAL | Status: AC

## 2011-06-18 NOTE — Telephone Encounter (Signed)
Triage Record Num: [id number] Operator: Durward Mallard DiMatteis Patient Name: Alexa Woods Call Date & Time: 06/18/2011 9:58:17AM Patient Phone: [phone] PCP: Audrie Gallus. [facility] Patient Gender: Female PCP Fax : Patient DOB: 1960/09/11 Practice Name: [facility] Reason for Call: Caller: [name]/Patient; PCP: Roxy Manns A.; CB#: [phone]; Call regarding head and chest cold sx; coughing up dark yellow sputum; chills, bodyaches; sx started 06/15/11; no fever; triaged per URI Guideline; See in 24 hr d/t productive cough with colored sputum; pt would like to have an appt today but needs appt at 3:30pm or after; 3:15pm today with Dr Patsy Lager Protocol(s) Used: Upper Respiratory Infection (URI) Recommended Outcome per Protocol: See Provider within 24 hours Reason for Outcome: Productive cough with colored sputum (other than clear or white sputum) Care Advice: ~ Use a cool mist humidifier to moisten air. Be sure to clean according to manufacturer's instructions. ~ May inhale steam from hot shower or heated water. Be careful to avoid burns. Increase fluids to 8-12 eight oz (1.6 to 2.4 liters) glasses per day, half of them to be water. Soups, popsicles, fruit juices, non-caffeinated sodas (unless restricting sodium intake), jello, broths, decaf teas, etc. are all okay. Warm fluids can be soothing. ~ ~ Warm fluids may help, or try a mixture of honey and lemon juice in warm tea. ~ SYMPTOM / CONDITION MANAGEMENT 06/18/2011 10:13:16AM Page 1 of 1 [email or web address]

## 2011-06-18 NOTE — Telephone Encounter (Signed)
Aware. 

## 2011-06-18 NOTE — Progress Notes (Signed)
  Patient Name: Alexa Woods Date of Birth: 1961-01-05 Age: 51 y.o. Medical Record Number: [id number] Gender: female Date of Encounter: 06/18/2011  History of Present Illness:  Alexa Woods is a 51 y.o. very pleasant female patient who presents with the following:  Started in her eyes and they are itching. Headaches and itching. Sneezing and feels like in her chest. Some chest and in chills. Some wheezing. Has not used inhaler.  Generally does not feel well - no sig fever Cough prod yellow sputum   Past Medical History, Surgical History, Social History, Family History, Problem List, Medications, and Allergies have been reviewed and updated if relevant.  Review of Systems: ROS: GEN: Acute illness details above GI: Tolerating PO intake GU: maintaining adequate hydration and urination Pulm: above Interactive and getting along well at home. ? Mild dysuria  Otherwise, ROS is as per the HPI.   Physical Examination: Filed Vitals:   06/18/11 1521  BP: 130/80  Pulse: 91  Temp: 98.8 F (37.1 C)  TempSrc: Oral  Height: 5\' 6"  (1.676 m)  Weight: 248 lb 1.9 oz (112.546 kg)  SpO2: 93%    Body mass index is 40.05 kg/(m^2).   GEN: A and O x 3. WDWN. NAD.    ENT: Nose clear, ext NML.  No LAD.  No JVD.  TM's clear. Oropharynx clear.  PULM: Normal WOB, no distress. occ wheeze, no crackles CV: RRR, no M/G/R, No rubs, No JVD.   EXT: warm and well-perfused, No c/c/e. PSYCH: Pleasant and conversant.   Assessment and Plan: 1. Bronchitis, acute  levofloxacin (LEVAQUIN) 500 MG tablet, chlorpheniramine-HYDROcodone (TUSSIONEX) 10-8 MG/5ML LQCR  2. Asthma exacerbation  levofloxacin (LEVAQUIN) 500 MG tablet, chlorpheniramine-HYDROcodone (TUSSIONEX) 10-8 MG/5ML LQCR    Mild asthma flare ? uti sx lvq Use albuterol up to q 4

## 2011-07-21 ENCOUNTER — Other Ambulatory Visit: Payer: Self-pay

## 2011-07-21 MED ORDER — GLUCOSE BLOOD VI STRP: ORAL_STRIP | Status: DC

## 2011-07-21 MED ORDER — ONETOUCH ULTRASOFT LANCETS MISC: Status: DC

## 2011-07-24 ENCOUNTER — Ambulatory Visit (INDEPENDENT_AMBULATORY_CARE_PROVIDER_SITE_OTHER): Payer: PRIVATE HEALTH INSURANCE | Admitting: Family Medicine

## 2011-07-24 ENCOUNTER — Encounter: Payer: Self-pay | Admitting: Family Medicine

## 2011-07-24 ENCOUNTER — Ambulatory Visit (INDEPENDENT_AMBULATORY_CARE_PROVIDER_SITE_OTHER)
Admission: RE | Admit: 2011-07-24 | Discharge: 2011-07-24 | Disposition: A | Discharge status: Home / Self Care | Payer: PRIVATE HEALTH INSURANCE | Source: Ambulatory Visit | Attending: Family Medicine | Admitting: Family Medicine

## 2011-07-24 VITALS — BP 120/82 | HR 88 | Temp 98.2°F | Ht 66.0 in | Wt 251.0 lb

## 2011-07-24 DIAGNOSIS — M79609 Pain in unspecified limb: Secondary | ICD-10-CM

## 2011-07-24 DIAGNOSIS — E119 Type 2 diabetes mellitus without complications: Secondary | ICD-10-CM

## 2011-07-24 DIAGNOSIS — M79672 Pain in left foot: Secondary | ICD-10-CM

## 2011-07-24 DIAGNOSIS — D696 Thrombocytopenia, unspecified: Secondary | ICD-10-CM

## 2011-07-24 DIAGNOSIS — Z01818 Encounter for other preprocedural examination: Secondary | ICD-10-CM | POA: Insufficient documentation

## 2011-07-24 DIAGNOSIS — J45909 Unspecified asthma, uncomplicated: Secondary | ICD-10-CM

## 2011-07-24 LAB — POCT URINALYSIS DIPSTICK
Leukocytes, UA: NEGATIVE
Nitrite, UA: NEGATIVE
Protein, UA: NEGATIVE
Urobilinogen, UA: NEGATIVE

## 2011-07-24 LAB — COMPREHENSIVE METABOLIC PANEL
ALT: 32 U/L (ref 0–35)
AST: 30 U/L (ref 0–37)
Albumin: 4.1 g/dL (ref 3.5–5.2)
BUN: 15 mg/dL (ref 6–23)
CO2: 32 mEq/L (ref 19–32)
Calcium: 9.3 mg/dL (ref 8.4–10.5)
Chloride: 99 mEq/L (ref 96–112)
Potassium: 3.9 mEq/L (ref 3.5–5.1)

## 2011-07-24 LAB — HEMOGLOBIN A1C: Hgb A1c MFr Bld: 8.9 % — ABNORMAL HIGH (ref 4.6–6.5)

## 2011-07-24 LAB — CBC WITH DIFFERENTIAL/PLATELET
Basophils Absolute: 0 10*3/uL (ref 0.0–0.1)
Eosinophils Absolute: 0 10*3/uL (ref 0.0–0.7)
Lymphocytes Relative: 20.9 % (ref 12.0–46.0)
MCHC: 33.5 g/dL (ref 30.0–36.0)
Neutrophils Relative %: 73.3 % (ref 43.0–77.0)
RDW: 12.8 % (ref 11.5–14.6)

## 2011-07-24 LAB — URIC ACID: Uric Acid, Serum: 4.2 mg/dL (ref 2.4–7.0)

## 2011-07-24 NOTE — Assessment & Plan Note (Signed)
H/o this in past.  If remaining low, will have her return for periph smear.

## 2011-07-24 NOTE — Assessment & Plan Note (Addendum)
Tender at metatarsal shafts.  ?stress fx.  Check xray today - normal on my read. Place in post - op boot to prevent further MT shaft stress and treat with nsaids. Update me if not improving with this treatment. No h/o DEXA scan or known low bone denisty. ?gout- check uric acid.

## 2011-07-24 NOTE — Progress Notes (Signed)
Subjective:    Patient ID: Alexa Woods, female    DOB: [birth year], 51 y.o.   MRN: [id number]  HPI CC: here for surgical clearance for tooth surgery.  Also wants foot checked.  51 yo patient of Dr. Royden Purl with h/o MI per chart, T2DM, GERD, HTN, asthma presents with left ankle pain as well as would like medical clearance for tooth surgery.  Planning on right upper molar extraction, very painful.  Broken tooth.  Wants surgery expedited.  States dentist wanted her cleared by endo but difficulty getting in so comes here.  DM - Last saw Dr. Everardo All months ago.  States blood sugars have been well controlled (100-150s throughout the day) but doesn't bring log.  H/o gastroparesis and diabetic neuropathy.  Reports compliance with lantus 50u qhs and novolog sliding scale. Lab Results  Component Value Date   HGBA1C 9.5* 10/10/2010   Not taking aspirin because told had low platelets.  Doesn't know who told her to stop.  Endorsing intermittent shortness of breath however reports compliance with asthma medications.  No recent chest pain/tightness.  Last chest pain was 8-9 years ago.  Cards eval for atypical chest pain 2009 (Dr. Gwen Pounds, last saw >2 yrs ago).  Unable to exercise 2/2 chronic pain.  Has had multiple surgeries in past including appy, hysterectomy, cholecystectomy, shoulder surgery, knee replacement.  Has had general anesthesia in past.  States has had propofol sedation and done well with this in past.  Last surgery was shoulder surgery 05/2010.  Also...L ankle pain for last 2 days.  Denies inciting injury/trauma to ankle.  Has seen ortho for ankle in past.  H/o heel spurs.  had xrays done 05/2011 for heel pain - reviewed and read as no acute finding, small plantar and achilles spurring.  Tingling from neuropathy but also tender to walk.  Unsure if twisted ankle.  Pain lateral midfoot that travels to toes and up to heel.  Reviewing allergies - lidocaine, codeine, tramadol allergy.   cephalosporin and sulfa allergy as well.  Medications and allergies reviewed and updated in chart.  Past histories reviewed and updated if relevant as below. Patient Active Problem List  Diagnoses  . HERPES ZOSTER, UNCOMPLICATED  . DIABETES MELLITUS, TYPE II  . HYPOKALEMIA  . MORBID OBESITY  . ANXIETY  . History of alcohol abuse  . DEPRESSION  . HYPERTENSION  . HEMORRHOIDS  . ALLERGIC RHINITIS  . ASTHMA  . GERD  . CYSTITIS, CHRONIC  . PSORIASIS  . PATELLO-FEMORAL SYNDROME  . EPIGASTRIC PAIN  . BACK PAIN  . DYSPHAGIA UNSPECIFIED  . Shoulder pain, left  . RLQ abdominal pain  . Thrombocytopenia  . Yeast infection involving the vagina and surrounding area  . Lung density on x-ray  . Leukocytosis  . Cough  . Dysuria  . Cellulitis of nostril  . Foot pain   Past Medical History  Diagnosis Date  . Epigastric abdominal pain   . Alcohol abuse, unspecified   . Allergic rhinitis, cause unspecified   . Anxiety state, unspecified   . Unspecified asthma   . Backache, unspecified   . Other chronic cystitis   . Depressive disorder, not elsewhere classified   . Type II or unspecified type diabetes mellitus without mention of complication, not stated as uncontrolled   . Dysphagia, unspecified   . Esophageal reflux   . Unspecified hemorrhoids without mention of complication   . Herpes zoster without mention of complication   . Unspecified essential hypertension   . Hypopotassemia   .  Morbid obesity   . Other screening mammogram   . Pain in joint, lower leg   . Other psoriasis   . History of Bell's palsy 4/09  . Wrist fracture   . Edema   . Gastroparesis   . Myocardial infarction    Past Surgical History  Procedure Date  . Appendectomy   . Cholecystectomy   . Partial hysterectomy   . Knee arthroscopy w/ partial medial meniscectomy 10/2008    and chondroplasty  . Total knee arthroplasty   . Esophagogastroduodenoscopy 07/2002    erythematous gastropathy  . Shoulder  surgery 05/2010   History  Substance Use Topics  . Smoking status: Former Smoker    Quit date: 12/30/2001  . Smokeless tobacco: Never Used  . Alcohol Use: No     former alcoholic    Family History  Problem Relation Age of Onset  . Alcohol abuse Mother   . Hypertension Mother   . Esophageal cancer Maternal Grandmother    Allergies  Allergen Reactions  . Bupropion Hcl   . Cefuroxime Axetil Nausea Only  . Codeine     REACTION: nausea and vomiting, rash  . Lidocaine     REACTION: unknown  . Metformin     REACTION: GI  . Omeprazole     REACTION: does not work for her  . Paroxetine     REACTION: doesn't agree  . Propoxyphene N-Acetaminophen     REACTION: wheezing  . Sulfonamide Derivatives     REACTION: rash  . Tramadol Hcl     REACTION: Causes Anxiety   Current Outpatient Prescriptions on File Prior to Visit  Medication Sig Dispense Refill  . albuterol (VENTOLIN HFA) 108 (90 BASE) MCG/ACT inhaler Inhale 2 puffs into the lungs every 4 (four) hours as needed.  1 Inhaler  3  . chlorpheniramine-HYDROcodone (TUSSIONEX) 10-8 MG/5ML LQCR Take 5 mLs by mouth at bedtime as needed.  240 mL  0  . dexlansoprazole (DEXILANT) 60 MG capsule Take 1 capsule (60 mg total) by mouth daily.  30 capsule  6  . fluticasone (FLONASE) 50 MCG/ACT nasal spray Place 2 sprays into the nose daily as needed.        . Fluticasone-Salmeterol (ADVAIR DISKUS) 100-50 MCG/DOSE AEPB Inhale 1 puff into the lungs every 12 (twelve) hours as needed.       Marland Kitchen glucose blood (ONE TOUCH ULTRA TEST) test strip Use as directed daily  100 each  6  . hydrochlorothiazide 25 MG tablet Take 1 tablet (25 mg total) by mouth daily.  30 tablet  11  . hydrocortisone (ANUSOL-HC) 2.5 % rectal cream Place rectally daily as needed. No more than 14 days in a row       . insulin aspart (NOVOLOG FLEXPEN) 100 UNIT/ML injection Inject into the skin. On sliding scale      . Insulin Pen Needle (EASY TOUCH PEN NEEDLES) 31G X 8 MM MISC by Does  not apply route as directed.        Marland Kitchen L-Methylfolate (DEPLIN) 15 MG TABS Take 1 capsule by mouth daily.        . Lancets (ONETOUCH ULTRASOFT) lancets Use as directed daily.  100 each  6  . LORazepam (ATIVAN) 1 MG tablet Take 1 mg by mouth as needed.        . mupirocin ointment (BACTROBAN) 2 % Apply to affected area in each nostril twice daily for 10 days  15 g  0  . nystatin (MYCOSTATIN) cream Apply topically as  needed.       Marland Kitchen olopatadine (PATANOL) 0.1 % ophthalmic solution Place 2 drops into both eyes 3 (three) times daily as needed.        Marland Kitchen oxyCODONE-acetaminophen (PERCOCET) 10-325 MG per tablet Take 1 tablet by mouth every 6 (six) hours as needed. For pain       . potassium chloride (KLOR-CON) 10 MEQ CR tablet Take 1 tablet (10 mEq total) by mouth daily.  60 tablet  6  . sertraline (ZOLOFT) 100 MG tablet Take 100 mg by mouth 2 (two) times daily.        Marland Kitchen tiZANidine (ZANAFLEX) 4 MG tablet Take 4 mg by mouth every 8 (eight) hours as needed. For muscle spasm       . zolpidem (AMBIEN) 10 MG tablet Take 15 mg by mouth at bedtime as needed.       Marland Kitchen aspirin 81 MG tablet Take 81 mg by mouth daily.         Review of Systems Per HPI    Objective:   Physical Exam  Nursing note and vitals reviewed. Constitutional: She appears well-developed and well-nourished. No distress.  HENT:  Head: Normocephalic and atraumatic.  Mouth/Throat: Oropharynx is clear and moist. No oropharyngeal exudate.  Eyes: Conjunctivae and EOM are normal. Pupils are equal, round, and reactive to light. No scleral icterus.  Neck: Normal range of motion. Neck supple. Carotid bruit is not present.  Cardiovascular: Normal rate, regular rhythm, normal heart sounds and intact distal pulses.   No murmur heard. Pulmonary/Chest: Effort normal and breath sounds normal. No respiratory distress. She has no wheezes. She has no rales.  Musculoskeletal: She exhibits no edema.       Pain with palpation at 4th/5th mid metatarsals. No pain  at base of 5ht metatarsal. Mild soft tissue swelling left superior forefoot.  Lymphadenopathy:    She has no cervical adenopathy.  Skin: Skin is warm and dry. No rash noted.  Psychiatric: She has a normal mood and affect.       Assessment & Plan:

## 2011-07-24 NOTE — Patient Instructions (Addendum)
Please sign release for records from Dr. Gwen Pounds (stress echo done 2009 as well as last office visit). Blood work today.  If sugar too uncontrolled, I will recommend seeing Dr. Everardo All to clear you for dental surgery. EKG today. Xray of foot today - looking ok - no fracture.  Use Post op boot until feeling better.  use anti inflammatories as needed.  If not improving, let me know. Get me copy of clearance paperwork from dentist.

## 2011-07-24 NOTE — Assessment & Plan Note (Addendum)
Here for tooth extraction clearance, which is usually deemed a low cardiac risk surgery. She has done well with anesthesia in past, states best tolerance with propofol but will leave that to surgeon. Cardiac - EKG today - NSR 80, normal axis, intervals, no hypertrophy, no acute ST / T changes.  low voltage.  PAC x2.  poor R wave progression.  unclear if truly had remote MI.  After extensive review of records/chart, found stress echo done 06/2007 by cards Gwen Pounds) - no ischemia, normal EF.  Reassuring. Resp - reviewed CXR from 03/2011 - normal.  Asthma seems well controlled on current regimen of advair bid and albuterol prn. Endo - no recent diabetes evaluation.  Last A1c 9.5%, too high.  Will recheck today.  Discussed if staying elevated will likely recommend return to Dr. Everardo All (endo) for clearance. Check blood work today including CBC, BMP, A1c. Also discussed intrinsic risks of surgery/anesthesia, but our goal is to optimize her health prior to surgery - which includes tight glucose control, blood pressure control, etc.  Pt seems to understand risks associated with surgery.  ==> ADDENDUM: A1c returned 8.9%.  Pt is deemed stable enough to undergo this low cardiac risk dental extraction surgery.  Advised to f/u with endo regarding diabetes management.

## 2011-07-27 ENCOUNTER — Telehealth: Payer: Self-pay | Admitting: Family Medicine

## 2011-07-27 NOTE — Telephone Encounter (Signed)
Will forward to Dr Reece Agar who saw her and disc case with me

## 2011-07-27 NOTE — Telephone Encounter (Signed)
To: [facility] (Daytime Triage) Fax: [phone] From: [facility] Date/ Time: 07/27/2011 4:13 PM Taken By: Forbes Cellar, CSR Caller: Steward Drone Facility: not collected Patient: Alexa, Woods DOB: 06-24-1960 Phone: [phone] Reason for Call: Returning a call from the RN Kim regarding medical clearance to get a tooth removed. Regarding Appointment: Appt Date: Appt Time: Unknown Provider: Reason: Details: Outcome:

## 2011-07-28 NOTE — Telephone Encounter (Signed)
plz notify RN that pt is cleared for surgery.  plz fax over blood work and OV.  plz notify pt of clearance and ask her to f/u with endocrinology for better diabetes control.

## 2011-07-28 NOTE — Telephone Encounter (Signed)
Attempted to call patient. Cell phone was "not accepting calls at this time". Attempted to leave message on home phone and "memory is full". I don't know which dentist to send clearance to. Will have to wait for patient to call me back.

## 2011-07-28 NOTE — Telephone Encounter (Signed)
Spoke with patient and notified her of results. She gave me information to fax clearance to oral surgeon. Notes faxed as directed.

## 2011-07-29 ENCOUNTER — Telehealth: Payer: Self-pay | Admitting: Family Medicine

## 2011-07-29 NOTE — Telephone Encounter (Signed)
Information faxed to [phone] attention Erica.

## 2011-07-29 NOTE — Telephone Encounter (Signed)
Received request to specify insulin dosing for SSI and lantus perioperatively.  Planned tooth extraction under light sedation. Please fax recs:  1. Decrease lantus to 20 units nightly starting night prior to procedure and continuing while pt is NPO, then increase to 40units while intake decreased, then back to regular dose of 50units QHS once intake back to normal. 2. May hold novolog flexpen (aspart) while NPO.

## 2011-07-30 ENCOUNTER — Other Ambulatory Visit: Payer: Self-pay | Admitting: *Deleted

## 2011-07-30 MED ORDER — HYDROCORTISONE 2.5 % RE CREA: TOPICAL_CREAM | Freq: Every day | RECTAL | Status: DC | PRN

## 2011-07-30 NOTE — Telephone Encounter (Signed)
Have her use the cream and call back if she still needs the suppositories next week (with update of her symptoms)

## 2011-07-30 NOTE — Telephone Encounter (Signed)
Patient requested refill of suppositories. I didn't see those on her list, only the cream. Will suppositories be okay? She is having a flare currently. I did go ahead and refill the cream for her.

## 2011-07-31 NOTE — Telephone Encounter (Signed)
Patient advised and will call back next week if needed

## 2011-08-15 ENCOUNTER — Ambulatory Visit (INDEPENDENT_AMBULATORY_CARE_PROVIDER_SITE_OTHER): Payer: PRIVATE HEALTH INSURANCE | Admitting: Family Medicine

## 2011-08-15 ENCOUNTER — Encounter: Payer: Self-pay | Admitting: Family Medicine

## 2011-08-15 VITALS — BP 110/76 | HR 75 | Temp 97.4°F | Wt 251.0 lb

## 2011-08-15 DIAGNOSIS — J40 Bronchitis, not specified as acute or chronic: Secondary | ICD-10-CM

## 2011-08-15 MED ORDER — AMOXICILLIN-POT CLAVULANATE 875-125 MG PO TABS: 1.0000 | ORAL_TABLET | Freq: Two times a day (BID) | ORAL | Status: AC

## 2011-08-15 MED ORDER — FLUTICASONE-SALMETEROL 250-50 MCG/DOSE IN AEPB: 1.0000 | INHALATION_SPRAY | Freq: Two times a day (BID) | RESPIRATORY_TRACT | Status: DC

## 2011-08-15 NOTE — Progress Notes (Signed)
  Subjective:    Patient ID: Alexa Woods, female    DOB: Jun 26, 1960, 51 y.o.   MRN: [id number]  HPI Here for one week of PND, chest congestion, and coughing up green sputum. No fever. Using her Ventolin rescue inhaler. Every spring about this time she struggles with asthma and allergies. On Mucinex   Review of Systems  Constitutional: Negative.   HENT: Positive for congestion and postnasal drip.   Eyes: Negative.   Respiratory: Positive for cough, chest tightness, shortness of breath and wheezing.        Objective:   Physical Exam  Constitutional: She appears well-developed and well-nourished.  HENT:  Right Ear: External ear normal.  Left Ear: External ear normal.  Nose: Nose normal.  Mouth/Throat: Oropharynx is clear and moist. No oropharyngeal exudate.  Eyes: Conjunctivae are normal.  Pulmonary/Chest: Effort normal. No respiratory distress. She has no rales.       Soft rhonchi and wheezes  Lymphadenopathy:    She has no cervical adenopathy.          Assessment & Plan:  Increase Advair to the 250/50 dose bid. Add Augmentin

## 2011-08-24 ENCOUNTER — Telehealth: Payer: Self-pay | Admitting: Family Medicine

## 2011-08-24 NOTE — Telephone Encounter (Signed)
Caller: Prim/Patient; PCP: Roxy Manns A.; CB#: [phone];  Calling today 08/24/11 regarding she has cough x1 month.  Saw Dr. Abran Cantor at [facility] in [place] on 08/15/11 and was prescribed Augmentin and Advair.  Still sneezing and coughing up mucus, says mucus gets over wind pipe and feels like she can't breath sometimes.  Mucus is yellow.  Says when she blows her nose she can smell amonia.  Afebrile.  She is still taking ABX BID.  Emergent symptoms r/o by Diabetes:  Respiratory Problems guidelines with exception of productive cough with colored sputum.  Care advice given. Advised pt per office note on profile, no appts today, referring pt that need to be seen today to [facility].

## 2011-08-24 NOTE — Telephone Encounter (Signed)
Please check in with her tomorrow- re: if she went to urgent care , and if not - set up with first available

## 2011-08-24 NOTE — Telephone Encounter (Signed)
[facility] Triage Call Report Triage Record Num: [id number] Operator: Thayer Headings Patient Name: Alexa Woods Call Date & Time: 08/24/2011 2:13:03PM Patient Phone: [phone] PCP: Idamae Schuller A. Tower Patient Gender: Female PCP Fax : Patient DOB: November 25, 1960 Practice Name: [facility] Reason for Call: Caller: Alexa Woods/Patient; PCP: Alexa Manns A.; CB#: [phone]; Calling today 08/24/11 regarding she has cough x1 month. Saw Dr. Abran Cantor at [facility] in [place] on 08/15/11 and was prescribed Augmentin and Advair. Still sneezing and coughing up mucus, says mucus gets over wind pipe and feels like she can't breath sometimes. Mucus is yellow. Says when she blows her nose she can smell amonia. Afebrile. She is still taking ABX BID. Emergent symptoms r/o by Diabetes: Respiratory Problems guidelines with exception of productive cough with colored sputum. Care advice given. Advised pt per office note on profile, no appts today, referring pt that need to be seen today to [facility]. Protocol(s) Used: Diabetes: Respiratory Problems Recommended Outcome per Protocol: See Provider within 4 hours Reason for Outcome: Productive cough with colored sputum (other than clear or white sputum) Care Advice: ~ Use a cool mist humidifier to moisten air. Be sure to clean according to manufacturer's instructions. ~ May inhale steam from hot shower or heated water. Be careful to avoid burns. Avoid exposure to environmental irritants. Do not smoke and avoid second-hand smoke. Avoid outside activities on high pollution days. Instead of strong smelling commercial cleaning products, substitute vinegar and lemon juice. ~ ~ SYMPTOM / CONDITION MANAGEMENT Analgesic/Antipyretic Advice - Acetaminophen: Consider acetaminophen as directed on label or by pharmacist/provider for pain or fever PRECAUTIONS: - Use if there is no history of liver disease, alcoholism, or intake of three or more alcohol drinks per  day - Only if approved by provider during pregnancy or when breastfeeding - During pregnancy, acetaminophen should not be taken more than 3 consecutive days without telling provider - Do not exceed recommended dose or frequency ~ Check with provider before using nonprescription medications. Many drugs may raise or lower the blood sugar level. Always check the labels for ingredients. Some liquids like cough syrup may contain sugar. ~ Diabetes Action Plan: - Follow recommended action plan and diet - Check blood sugar as recommended - Take diabetic pills or insulin as ordered - Follow action plan for illness. ~ 08/24/2011 2:23:20PM Page 1 of 1 [email or web address]

## 2011-08-25 NOTE — Telephone Encounter (Signed)
Left message on machine at home for patient to return call. 

## 2011-08-25 NOTE — Telephone Encounter (Signed)
Patient stated that she cannot afford to go to Urgent Care or come to the doctor.  She still has the antibiotic and the cough medication.  She will continue those and let us know how she is doing.  She will make an appt with Dr. Milinda Antis if she feels that she needs to.

## 2011-08-25 NOTE — Telephone Encounter (Signed)
Ok, noted -she will call back if needs appt

## 2011-09-02 ENCOUNTER — Ambulatory Visit (INDEPENDENT_AMBULATORY_CARE_PROVIDER_SITE_OTHER): Payer: PRIVATE HEALTH INSURANCE | Admitting: Family Medicine

## 2011-09-02 ENCOUNTER — Encounter: Payer: Self-pay | Admitting: Family Medicine

## 2011-09-02 VITALS — BP 132/70 | HR 90 | Temp 98.1°F | Ht 66.0 in | Wt 251.0 lb

## 2011-09-02 DIAGNOSIS — K649 Unspecified hemorrhoids: Secondary | ICD-10-CM

## 2011-09-02 DIAGNOSIS — K2970 Gastritis, unspecified, without bleeding: Secondary | ICD-10-CM

## 2011-09-02 DIAGNOSIS — J40 Bronchitis, not specified as acute or chronic: Secondary | ICD-10-CM

## 2011-09-02 DIAGNOSIS — K2990 Gastroduodenitis, unspecified, without bleeding: Secondary | ICD-10-CM

## 2011-09-02 MED ORDER — HYDROCORTISONE ACETATE 25 MG RE SUPP: 25.0000 mg | Freq: Every day | RECTAL | Status: AC

## 2011-09-02 NOTE — Assessment & Plan Note (Signed)
Acting up with constipation  Disc tx of constipation  Will try anusol hc suppos for 10 d Has worked well in past Update if not starting to improve in a week or if worsening

## 2011-09-02 NOTE — Progress Notes (Signed)
Subjective:    Patient ID: Alexa Woods, female    DOB: June 05, 1960, 51 y.o.   MRN: [id number]  HPI Here for abd pain and bronchitis   knawing pain in LUQ of abdomen- for 2 weeks  Worse when empty or really full ? Gastroparesis  Rev EGD-- nl from 4/12 Also colonosc 4/12 -- nl  Has hx of hemorrhoids - acting up  brb in stool on and off for a month -- constipated pretty bad - has to strain  Does hurt at times/ often none   Is taking dexilant  Once daily - is compliant with this   Has had bronchitis since the end of feb  Saw Dr Clent Ridges and put on augmentin  She finished that  Now is coughing - with phlegm -- is yellow and sometimes clear  Nose runs clear liquid A little wheeze No fever   Patient Active Problem List  Diagnoses  . HERPES ZOSTER, UNCOMPLICATED  . DIABETES MELLITUS, TYPE II  . HYPOKALEMIA  . MORBID OBESITY  . ANXIETY  . History of alcohol abuse  . DEPRESSION  . HYPERTENSION  . HEMORRHOIDS  . ALLERGIC RHINITIS  . ASTHMA  . GERD  . CYSTITIS, CHRONIC  . PSORIASIS  . PATELLO-FEMORAL SYNDROME  . EPIGASTRIC PAIN  . BACK PAIN  . DYSPHAGIA UNSPECIFIED  . Shoulder pain, left  . RLQ abdominal pain  . Thrombocytopenia  . Yeast infection involving the vagina and surrounding area  . Lung density on x-ray  . Cough  . Dysuria  . Cellulitis of nostril  . Foot pain  . Preoperative clearance  . Left foot pain  . Gastritis  . Bronchitis   Past Medical History  Diagnosis Date  . Epigastric abdominal pain   . Alcohol abuse, unspecified   . Allergic rhinitis, cause unspecified   . Anxiety state, unspecified   . Unspecified asthma   . Backache, unspecified   . Other chronic cystitis   . Depressive disorder, not elsewhere classified   . Type II or unspecified type diabetes mellitus without mention of complication, not stated as uncontrolled   . Dysphagia, unspecified   . Esophageal reflux   . Unspecified hemorrhoids without mention of complication   . Herpes  zoster without mention of complication   . Unspecified essential hypertension   . Hypopotassemia   . Morbid obesity   . Other screening mammogram   . Pain in joint, lower leg   . Other psoriasis   . History of Bell's palsy 4/09  . Wrist fracture   . Edema   . Gastroparesis   . Myocardial infarction    Past Surgical History  Procedure Date  . Appendectomy   . Cholecystectomy   . Partial hysterectomy   . Knee arthroscopy w/ partial medial meniscectomy 10/2008    and chondroplasty  . Total knee arthroplasty   . Esophagogastroduodenoscopy 07/2002    erythematous gastropathy  . Shoulder surgery 05/2010  . Dobutamine stress echo 2009     normal stress echo, no evidence of ischemia   History  Substance Use Topics  . Smoking status: Former Smoker    Quit date: 12/30/2001  . Smokeless tobacco: Never Used  . Alcohol Use: No     former alcoholic    Family History  Problem Relation Age of Onset  . Alcohol abuse Mother   . Hypertension Mother   . Esophageal cancer Maternal Grandmother    Allergies  Allergen Reactions  . Bupropion Hcl   .  Cefuroxime Axetil Nausea Only  . Codeine     REACTION: nausea and vomiting, rash  . Lidocaine     REACTION: unknown  . Metformin     REACTION: GI  . Omeprazole     REACTION: does not work for her  . Paroxetine     REACTION: doesn't agree  . Propoxacet-N     REACTION: wheezing  . Sulfonamide Derivatives     REACTION: rash  . Tramadol Hcl     REACTION: Causes Anxiety   Current Outpatient Prescriptions on File Prior to Visit  Medication Sig Dispense Refill  . albuterol (VENTOLIN HFA) 108 (90 BASE) MCG/ACT inhaler Inhale 2 puffs into the lungs every 4 (four) hours as needed.  1 Inhaler  3  . chlorpheniramine-HYDROcodone (TUSSIONEX) 10-8 MG/5ML LQCR Take 5 mLs by mouth at bedtime as needed.  240 mL  0  . dexlansoprazole (DEXILANT) 60 MG capsule Take 1 capsule (60 mg total) by mouth daily.  30 capsule  6  . fluticasone (FLONASE) 50  MCG/ACT nasal spray Place 2 sprays into the nose daily as needed.        . Fluticasone-Salmeterol (ADVAIR DISKUS) 250-50 MCG/DOSE AEPB Inhale 1 puff into the lungs 2 (two) times daily.  60 each  0  . glucose blood (ONE TOUCH ULTRA TEST) test strip Use as directed daily  100 each  6  . hydrochlorothiazide 25 MG tablet Take 1 tablet (25 mg total) by mouth daily.  30 tablet  11  . hydrocortisone (ANUSOL-HC) 2.5 % rectal cream Place rectally daily as needed. No more than 14 days in a row  30 g  0  . insulin aspart (NOVOLOG FLEXPEN) 100 UNIT/ML injection 5 units with dinner  1 pen    . insulin glargine (LANTUS SOLOSTAR) 100 UNIT/ML injection Inject 50 Units into the skin at bedtime.      . Insulin Pen Needle (EASY TOUCH PEN NEEDLES) 31G X 8 MM MISC by Does not apply route as directed.        Marland Kitchen L-Methylfolate (DEPLIN) 15 MG TABS Take 1 capsule by mouth daily.        . Lancets (ONETOUCH ULTRASOFT) lancets Use as directed daily.  100 each  6  . LORazepam (ATIVAN) 1 MG tablet Take 1 mg by mouth as needed.        . mupirocin ointment (BACTROBAN) 2 % Apply to affected area in each nostril twice daily for 10 days  15 g  0  . olopatadine (PATANOL) 0.1 % ophthalmic solution Place 2 drops into both eyes 3 (three) times daily as needed.        Marland Kitchen oxyCODONE-acetaminophen (PERCOCET) 10-325 MG per tablet Take 1 tablet by mouth every 6 (six) hours as needed. For pain       . potassium chloride (KLOR-CON) 10 MEQ CR tablet Take 1 tablet (10 mEq total) by mouth daily.  60 tablet  6  . sertraline (ZOLOFT) 100 MG tablet Take 100 mg by mouth 2 (two) times daily.        Marland Kitchen tiZANidine (ZANAFLEX) 4 MG tablet Take 4 mg by mouth every 8 (eight) hours as needed. For muscle spasm       . zolpidem (AMBIEN) 10 MG tablet Take 15 mg by mouth at bedtime as needed.       Marland Kitchen DISCONTD: insulin glargine (LANTUS SOLOSTAR) 100 UNIT/ML injection Inject 50 Units into the skin daily. In the morning, and pen needles 2/day  30 mL  12  .  DISCONTD:  insulin glargine (LANTUS) 100 UNIT/ML injection Inject 50 Units into the skin daily.             Review of Systems Review of Systems  Constitutional: Negative for fever, appetite change, and unexpected weight change. pos for fatigue  ENT pos for congestion and post nasal drip/ neg for sinus pain or ST Eyes: Negative for pain and visual disturbance.  Respiratory: Negative for sob/ wheeze, pos for cough   Cardiovascular: Negative for cp or palpitations    Gastrointestinal: Negative for nausea, diarrhea and pos for constipation/ brb in stool/ epigastric pain , neg for dark stools  Genitourinary: Negative for urgency and frequency.  Skin: Negative for pallor or rash   Neurological: Negative for weakness, light-headedness, numbness and headaches.  Hematological: Negative for adenopathy. Does not bruise/bleed easily.  Psychiatric/Behavioral: Negative for dysphoric mood. The patient stays somewhat nervous / anxious        Objective:   Physical Exam  Constitutional: She appears well-developed and well-nourished. No distress.  HENT:  Head: Normocephalic and atraumatic.  Right Ear: External ear normal.  Left Ear: External ear normal.  Mouth/Throat: Oropharynx is clear and moist. No oropharyngeal exudate.       Nares are boggy No sinus tenderness  Eyes: Conjunctivae and EOM are normal. Pupils are equal, round, and reactive to light.  Neck: Normal range of motion. Neck supple. No thyromegaly present.  Cardiovascular: Normal rate, regular rhythm and normal heart sounds.  Exam reveals no gallop.   Pulmonary/Chest: Effort normal and breath sounds normal. No respiratory distress. She has no wheezes. She has no rales.       cta with good air exch No wheeze even on forced exp No rhonchi or rales   Abdominal: Soft. Bowel sounds are normal. She exhibits no distension and no mass. There is tenderness. There is no rebound and no guarding.       Mild LUQ tenderness without rebound or guarding  No  lower abd or pelvic tenderness  Musculoskeletal: Normal range of motion. She exhibits no edema.  Lymphadenopathy:    She has no cervical adenopathy.  Neurological: She is alert. She has normal reflexes. She exhibits normal muscle tone. Coordination normal.  Skin: Skin is warm and dry. No erythema. No pallor.  Psychiatric: She has a normal mood and affect.          Assessment & Plan:

## 2011-09-02 NOTE — Assessment & Plan Note (Signed)
Had this in march- residual cough now, no reactive airways Finished a course of augmentin  Suspect this is residual cough (re assuring exam) - recommended mucinex DM otc  Update if not starting to improve in a week or if worsening

## 2011-09-02 NOTE — Assessment & Plan Note (Signed)
Secondary to DM gastroparesis  On dexilant Rev her EGD from 1 y ago- reassuring  Will add zantac 150 bid for 2 wk and update Disc diet - and eating small portions

## 2011-09-02 NOTE — Patient Instructions (Addendum)
Continue the dexilant Get zantac over the counter 150 mg and take it am and pm - for 2 weeks If stomach pain does not go away- please let me know  For bronchitis- I recommend mucinex DM as needed over the counter for cough and congestion- and let me know if no further improvement in 2 weeks  For hemorrhoids- use the anusol hc suppositories --one each nightly for 10 days - and let me know if bleeding does not stop  Keep stools soft with stool softener or miralax over the counter and lots of water

## 2011-09-16 ENCOUNTER — Other Ambulatory Visit: Payer: Self-pay | Admitting: *Deleted

## 2011-09-16 MED ORDER — INSULIN PEN NEEDLE 31G X 8 MM MISC: Status: DC

## 2011-09-16 NOTE — Telephone Encounter (Signed)
R'cd fax from [facility] for refill of BD Pen needles

## 2011-09-21 ENCOUNTER — Other Ambulatory Visit: Payer: Self-pay | Admitting: *Deleted

## 2011-09-21 MED ORDER — HYDROCHLOROTHIAZIDE 25 MG PO TABS: 25.0000 mg | ORAL_TABLET | Freq: Every day | ORAL | Status: DC

## 2011-09-21 NOTE — Telephone Encounter (Signed)
Will refill electronically  

## 2011-09-21 NOTE — Telephone Encounter (Signed)
Received faxed refill request from pharmacy. See allergy/contraindication. Is it okay to refill medication? 

## 2011-10-16 ENCOUNTER — Ambulatory Visit (INDEPENDENT_AMBULATORY_CARE_PROVIDER_SITE_OTHER): Payer: PRIVATE HEALTH INSURANCE | Admitting: Family Medicine

## 2011-10-16 ENCOUNTER — Encounter: Payer: Self-pay | Admitting: Family Medicine

## 2011-10-16 VITALS — BP 130/70 | HR 91 | Temp 98.0°F | Ht 66.0 in | Wt 249.5 lb

## 2011-10-16 DIAGNOSIS — B9689 Other specified bacterial agents as the cause of diseases classified elsewhere: Secondary | ICD-10-CM

## 2011-10-16 DIAGNOSIS — A499 Bacterial infection, unspecified: Secondary | ICD-10-CM

## 2011-10-16 DIAGNOSIS — J329 Chronic sinusitis, unspecified: Secondary | ICD-10-CM

## 2011-10-16 MED ORDER — AMOXICILLIN 500 MG PO CAPS: 500.0000 mg | ORAL_CAPSULE | Freq: Three times a day (TID) | ORAL | Status: AC

## 2011-10-16 NOTE — Assessment & Plan Note (Signed)
With uri for DM patient  Will cover with amox and drink lots of water  Disc symptomatic care - see instructions on AVS  Update if not starting to improve in a week or if worsening   Note for work to be out today

## 2011-10-16 NOTE — Progress Notes (Signed)
Subjective:    Patient ID: Alexa Woods, female    DOB: Jan 20, 1961, 51 y.o.   MRN: [id number]  HPI Is here with uri symptoms -- very hoarse  Sick all week and getting worse  Sob/ wheeze/ st /sneezing Sinus pain  Cough - occ prod - green  Nasal d/c is green  Some ear pain   No fever now - but has felt like it  Some chills  Had some tick bites- no problems with those and no rash   Patient Active Problem List  Diagnoses  . HERPES ZOSTER, UNCOMPLICATED  . DIABETES MELLITUS, TYPE II  . HYPOKALEMIA  . MORBID OBESITY  . ANXIETY  . History of alcohol abuse  . DEPRESSION  . HYPERTENSION  . HEMORRHOIDS  . ALLERGIC RHINITIS  . ASTHMA  . GERD  . CYSTITIS, CHRONIC  . PSORIASIS  . PATELLO-FEMORAL SYNDROME  . EPIGASTRIC PAIN  . BACK PAIN  . DYSPHAGIA UNSPECIFIED  . Shoulder pain, left  . RLQ abdominal pain  . Thrombocytopenia  . Yeast infection involving the vagina and surrounding area  . Lung density on x-ray  . Cough  . Dysuria  . Cellulitis of nostril  . Foot pain  . Preoperative clearance  . Left foot pain  . Gastritis  . Bronchitis  . Sinusitis, bacterial   Past Medical History  Diagnosis Date  . Epigastric abdominal pain   . Alcohol abuse, unspecified   . Allergic rhinitis, cause unspecified   . Anxiety state, unspecified   . Unspecified asthma   . Backache, unspecified   . Other chronic cystitis   . Depressive disorder, not elsewhere classified   . Type II or unspecified type diabetes mellitus without mention of complication, not stated as uncontrolled   . Dysphagia, unspecified   . Esophageal reflux   . Unspecified hemorrhoids without mention of complication   . Herpes zoster without mention of complication   . Unspecified essential hypertension   . Hypopotassemia   . Morbid obesity   . Other screening mammogram   . Pain in joint, lower leg   . Other psoriasis   . History of Bell's palsy 4/09  . Wrist fracture   . Edema   . Gastroparesis   .  Myocardial infarction    Past Surgical History  Procedure Date  . Appendectomy   . Cholecystectomy   . Partial hysterectomy   . Knee arthroscopy w/ partial medial meniscectomy 10/2008    and chondroplasty  . Total knee arthroplasty   . Esophagogastroduodenoscopy 07/2002    erythematous gastropathy  . Shoulder surgery 05/2010  . Dobutamine stress echo 2009     normal stress echo, no evidence of ischemia   History  Substance Use Topics  . Smoking status: Former Smoker    Quit date: 12/30/2001  . Smokeless tobacco: Never Used  . Alcohol Use: No     former alcoholic    Family History  Problem Relation Age of Onset  . Alcohol abuse Mother   . Hypertension Mother   . Esophageal cancer Maternal Grandmother    Allergies  Allergen Reactions  . Bupropion Hcl   . Cefuroxime Axetil Nausea Only  . Codeine     REACTION: nausea and vomiting, rash  . Lidocaine     REACTION: unknown  . Metformin     REACTION: GI  . Omeprazole     REACTION: does not work for her  . Paroxetine     REACTION: doesn't agree  . Propoxyphene-Acetaminophen  REACTION: wheezing  . Sulfonamide Derivatives     REACTION: rash  . Tramadol Hcl     REACTION: Causes Anxiety   Current Outpatient Prescriptions on File Prior to Visit  Medication Sig Dispense Refill  . albuterol (VENTOLIN HFA) 108 (90 BASE) MCG/ACT inhaler Inhale 2 puffs into the lungs every 4 (four) hours as needed.  1 Inhaler  3  . chlorpheniramine-HYDROcodone (TUSSIONEX) 10-8 MG/5ML LQCR Take 5 mLs by mouth at bedtime as needed.  240 mL  0  . dexlansoprazole (DEXILANT) 60 MG capsule Take 1 capsule (60 mg total) by mouth daily.  30 capsule  6  . fluticasone (FLONASE) 50 MCG/ACT nasal spray Place 2 sprays into the nose daily as needed.        . Fluticasone-Salmeterol (ADVAIR DISKUS) 250-50 MCG/DOSE AEPB Inhale 1 puff into the lungs 2 (two) times daily.  60 each  0  . glucose blood (ONE TOUCH ULTRA TEST) test strip Use as directed daily  100  each  6  . hydrochlorothiazide (HYDRODIURIL) 25 MG tablet Take 1 tablet (25 mg total) by mouth daily.  30 tablet  11  . hydrocortisone (ANUSOL-HC) 2.5 % rectal cream Place rectally daily as needed. No more than 14 days in a row  30 g  0  . insulin aspart (NOVOLOG FLEXPEN) 100 UNIT/ML injection 5 units with dinner  1 pen    . insulin glargine (LANTUS SOLOSTAR) 100 UNIT/ML injection Inject 50 Units into the skin at bedtime.      . Insulin Pen Needle (EASY TOUCH PEN NEEDLES) 31G X 8 MM MISC Use as directed twice daily  100 each  2  . L-Methylfolate (DEPLIN) 15 MG TABS Take 1 capsule by mouth daily.        . Lancets (ONETOUCH ULTRASOFT) lancets Use as directed daily.  100 each  6  . LORazepam (ATIVAN) 1 MG tablet Take 1 mg by mouth as needed.        . mupirocin ointment (BACTROBAN) 2 % Apply to affected area in each nostril twice daily for 10 days  15 g  0  . olopatadine (PATANOL) 0.1 % ophthalmic solution Place 2 drops into both eyes 3 (three) times daily as needed.        Marland Kitchen oxyCODONE-acetaminophen (PERCOCET) 10-325 MG per tablet Take 1 tablet by mouth every 6 (six) hours as needed. For pain       . potassium chloride (KLOR-CON) 10 MEQ CR tablet Take 1 tablet (10 mEq total) by mouth daily.  60 tablet  6  . sertraline (ZOLOFT) 100 MG tablet Take 100 mg by mouth 2 (two) times daily.        Marland Kitchen tiZANidine (ZANAFLEX) 4 MG tablet Take 4 mg by mouth every 8 (eight) hours as needed. For muscle spasm       . zolpidem (AMBIEN) 10 MG tablet Take 15 mg by mouth at bedtime as needed.             Review of Systems Review of Systems  Constitutional: Negative for fever, appetite change,  and unexpected weight change. pos for fatigue ENT pos for sinus pain and cong and st Eyes: Negative for pain and visual disturbance.  Respiratory: pos for cough and wheeze  Cardiovascular: Negative for cp or palpitations    Gastrointestinal: Negative for nausea, diarrhea and constipation.  Genitourinary: Negative for  urgency and frequency.  Skin: Negative for pallor or rash   Neurological: Negative for weakness, light-headedness, numbness and headaches.  Hematological:  Negative for adenopathy. Does not bruise/bleed easily.  Psychiatric/Behavioral: Negative for dysphoric mood. The patient is not nervous/anxious.          Objective:   Physical Exam  Constitutional: She appears well-developed and well-nourished. No distress.  HENT:  Head: Normocephalic and atraumatic.  Right Ear: External ear normal.  Left Ear: External ear normal.  Mouth/Throat: Oropharynx is clear and moist.       Nares are injected and congested  Sinus tenderness bilat maxillary worse on L  Eyes: Conjunctivae and EOM are normal. Pupils are equal, round, and reactive to light. Right eye exhibits no discharge. Left eye exhibits no discharge.  Neck: Normal range of motion. Neck supple. No JVD present. Carotid bruit is not present. No thyromegaly present.  Cardiovascular: Normal rate, regular rhythm, normal heart sounds and intact distal pulses.   Pulmonary/Chest: Effort normal. No respiratory distress. She has wheezes. She has no rales. She exhibits no tenderness.       Scant wheeze on forced exp only  Musculoskeletal: Normal range of motion. She exhibits no edema.  Lymphadenopathy:    She has no cervical adenopathy.  Neurological: She is alert. She has normal reflexes. No cranial nerve deficit.  Skin: Skin is warm and dry.  Psychiatric: She has a normal mood and affect.          Assessment & Plan:

## 2011-10-16 NOTE — Patient Instructions (Signed)
You have a cold and sinus infection Take the amoxicillin for sinus infection  Drink lots of fluids and get rest  mucinex for congestion Update if not starting to improve in a week or if worsening

## 2011-10-19 ENCOUNTER — Ambulatory Visit (INDEPENDENT_AMBULATORY_CARE_PROVIDER_SITE_OTHER): Payer: PRIVATE HEALTH INSURANCE | Admitting: Family Medicine

## 2011-10-19 ENCOUNTER — Encounter: Payer: Self-pay | Admitting: *Deleted

## 2011-10-19 ENCOUNTER — Encounter: Payer: Self-pay | Admitting: Family Medicine

## 2011-10-19 ENCOUNTER — Telehealth: Payer: Self-pay | Admitting: Family Medicine

## 2011-10-19 VITALS — BP 120/72 | HR 77 | Temp 97.9°F | Ht 66.0 in | Wt 246.8 lb

## 2011-10-19 DIAGNOSIS — J40 Bronchitis, not specified as acute or chronic: Secondary | ICD-10-CM

## 2011-10-19 MED ORDER — HYDROCODONE-HOMATROPINE 5-1.5 MG/5ML PO SYRP: 5.0000 mL | ORAL_SOLUTION | Freq: Every evening | ORAL | Status: AC | PRN

## 2011-10-19 NOTE — Telephone Encounter (Signed)
Caller: Alexa Woods/Patient; PCP: Tower, Marne A.; CB#: [phone];  Call regarding Cough/Congestion; Cont'd cough making her feel as she is choking. Prescribed Amox for a cough on 10/16/11. She ? if she can get a doctor's note to be out of work today. Advised she will need to be seen. Emergent sx r/o. Appt sched for 0930 today with Dr. Ermalene Searing.

## 2011-10-19 NOTE — Assessment & Plan Note (Signed)
Possibly viral infection... Continue amoxicillin as it has not been long enough for this to work if there is a bacterial infection. Only mild asthma exacerbation per peak flow 260 best, no wheeze on lung exam. Will hold prednisone as elevates her blood sugar a lot Use albuterol prn. Add cough suppressant to regimen.  Follow up/call if not improving in next 4-5 days.

## 2011-10-19 NOTE — Progress Notes (Signed)
  Subjective:    Patient ID: Alexa Woods, female    DOB: 07-18-1960, 51 y.o.   MRN: [id number]  HPI  51 year old female seen 3 days ago by PCP, Dr. Milinda Antis.  Dx with : URI in DM patient Started on amoxicillin   She reports continued wheeze, cough, chest pain with cough.  Nose burning.  Chills, no measured fever( 101 before starting antibiotics). Cough keeping her u[p at night.  Hx of asthma: using albuterol daily, takes advair daily. Has not needed today yet  Ex smokers.Marland Kitchen Hx 20-30 pack year history Quit 10 years ago.    Review of Systems  Constitutional: Positive for fatigue. Negative for fever.  HENT: Negative for ear pain.   Eyes: Negative for pain.  Respiratory: Positive for cough and wheezing. Negative for shortness of breath.   Cardiovascular: Negative for chest pain and leg swelling.  Gastrointestinal: Negative for abdominal pain.       Objective:   Physical Exam  Constitutional: She appears well-developed and well-nourished. No distress.  HENT:  Head: Normocephalic and atraumatic.  Right Ear: External ear normal.  Left Ear: External ear normal.  Nose: Mucosal edema and rhinorrhea present. Right sinus exhibits no maxillary sinus tenderness and no frontal sinus tenderness. Left sinus exhibits no maxillary sinus tenderness and no frontal sinus tenderness.  Mouth/Throat: Oropharynx is clear and moist.  Eyes: Conjunctivae and EOM are normal. Pupils are equal, round, and reactive to light. Right eye exhibits no discharge. Left eye exhibits no discharge.  Neck: Normal range of motion. Neck supple. No JVD present. Carotid bruit is not present. No thyromegaly present.  Cardiovascular: Normal rate, regular rhythm, normal heart sounds and intact distal pulses.   Pulmonary/Chest: Effort normal. No respiratory distress. She has no wheezes. She has no rales. She exhibits no tenderness.  Musculoskeletal: Normal range of motion. She exhibits no edema.  Lymphadenopathy:    She  has no cervical adenopathy.  Neurological: She is alert. She has normal reflexes. No cranial nerve deficit.  Skin: Skin is warm and dry.  Psychiatric: She has a normal mood and affect.          Assessment & Plan:

## 2011-10-19 NOTE — Patient Instructions (Signed)
Continue amoxicillin as it has not been long enough for this to work if there is a bacterial infection. Only mild asthma exacerbation per peak flow 260 best, no wheeze on lung exam. Will hold prednisone as elevates her blood sugar a lot Use albuterol prn. Add cough suppressant to regimen.  Follow up/call if not improving in next 4-5 days.

## 2011-10-22 ENCOUNTER — Other Ambulatory Visit: Payer: Self-pay | Admitting: Endocrinology

## 2011-12-14 ENCOUNTER — Telehealth: Payer: Self-pay | Admitting: *Deleted

## 2011-12-14 NOTE — Telephone Encounter (Signed)
Form for diabetic testing supplies from [facility] in your IN box for completion.

## 2011-12-15 NOTE — Telephone Encounter (Signed)
Done and in IN box, thanks  

## 2011-12-16 NOTE — Telephone Encounter (Signed)
faxed Rx to [facility] @ [phone].

## 2011-12-17 ENCOUNTER — Encounter: Payer: Self-pay | Admitting: Family Medicine

## 2011-12-17 ENCOUNTER — Telehealth: Payer: Self-pay

## 2011-12-17 ENCOUNTER — Ambulatory Visit (INDEPENDENT_AMBULATORY_CARE_PROVIDER_SITE_OTHER): Payer: PRIVATE HEALTH INSURANCE | Admitting: Family Medicine

## 2011-12-17 VITALS — BP 120/78 | HR 93 | Temp 98.5°F | Ht 66.0 in | Wt 245.0 lb

## 2011-12-17 DIAGNOSIS — E119 Type 2 diabetes mellitus without complications: Secondary | ICD-10-CM

## 2011-12-17 DIAGNOSIS — L259 Unspecified contact dermatitis, unspecified cause: Secondary | ICD-10-CM

## 2011-12-17 MED ORDER — PREDNISONE 20 MG PO TABS: ORAL_TABLET | ORAL | Status: DC

## 2011-12-17 MED ORDER — INSULIN ASPART 100 UNIT/ML ~~LOC~~ SOLN: SUBCUTANEOUS | Status: DC

## 2011-12-17 NOTE — Assessment & Plan Note (Signed)
Treat with oral prednisone and antihistamine. Call if not improving.  Cold compress to area around eye.

## 2011-12-17 NOTE — Patient Instructions (Signed)
Oral course of prednsione for poison ivy.  Go ahead and increase lantus to 52 Units daily. Use novolog sliding scale if blood sugars raise on prednsione.

## 2011-12-17 NOTE — Assessment & Plan Note (Signed)
Poor control.. Will worsen with prednisone. Go ahead and increase Lantus some and restart Novolog sliding scale. Return to ENDO when able for titration of DM meds further.

## 2011-12-17 NOTE — Telephone Encounter (Signed)
Started 12/16/11 with poison ivy left eyelid and neck. Left eye itcy; no drainage and no visual problem. Pt scheduled appt today 12:15 with Dr Ermalene Searing; it pt condition changes or worsens pt will go to UC.

## 2011-12-17 NOTE — Progress Notes (Signed)
  Subjective:    Patient ID: Alexa Woods, female    DOB: [birth year], 51 y.o.   MRN: [id number]  Poison [place] This is a new problem. The current episode started in the past 7 days. The problem has been gradually worsening since onset. The affected locations include the face, chest and left hand. The rash is characterized by blistering, burning and itchiness. She was exposed to plant contact (exposed with lawn mowing.). Pertinent negatives include no anorexia, cough, fatigue, fever, shortness of breath, sore throat or vomiting. Past treatments include anti-itch cream and cold compress. The treatment provided no relief. There is no history of allergies or eczema.    She is a diabetic. Lab Results  Component Value Date   HGBA1C 8.9* 07/24/2011  FBS lately 122-200. On lantus 50 Units daily Uses Novolog on sliding scale.    Review of Systems  Constitutional: Negative for fever and fatigue.  HENT: Negative for sore throat.   Respiratory: Negative for cough and shortness of breath.   Gastrointestinal: Negative for vomiting and anorexia.       Objective:   Physical Exam  Constitutional: She appears well-developed and well-nourished.  HENT:  Head: Normocephalic.  Eyes: Conjunctivae and EOM are normal. Pupils are equal, round, and reactive to light.  Neck: Normal range of motion. Neck supple.  Cardiovascular: Normal rate, regular rhythm and normal heart sounds.   Pulmonary/Chest: Effort normal and breath sounds normal.  Skin: Rash noted. Rash is vesicular.             Assessment & Plan:

## 2012-01-12 ENCOUNTER — Ambulatory Visit (INDEPENDENT_AMBULATORY_CARE_PROVIDER_SITE_OTHER): Payer: PRIVATE HEALTH INSURANCE | Admitting: Family Medicine

## 2012-01-12 ENCOUNTER — Encounter: Payer: Self-pay | Admitting: Family Medicine

## 2012-01-12 ENCOUNTER — Telehealth: Payer: Self-pay | Admitting: Gastroenterology

## 2012-01-12 VITALS — BP 105/78 | HR 85 | Temp 98.2°F | Ht 66.0 in | Wt 231.8 lb

## 2012-01-12 DIAGNOSIS — K649 Unspecified hemorrhoids: Secondary | ICD-10-CM

## 2012-01-12 DIAGNOSIS — F438 Other reactions to severe stress: Secondary | ICD-10-CM

## 2012-01-12 DIAGNOSIS — F430 Acute stress reaction: Secondary | ICD-10-CM | POA: Insufficient documentation

## 2012-01-12 MED ORDER — HYDROCORTISONE 2.5 % RE CREA: TOPICAL_CREAM | Freq: Two times a day (BID) | RECTAL | Status: DC
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
## 2012-01-12 NOTE — Assessment & Plan Note (Signed)
Worse with constipation and straining  Stressed the imp of softening stools-see avs Needs to drink more water Given px for anusol hc cream - bid 14 d and upate if worse or no imp
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
## 2012-01-12 NOTE — Telephone Encounter (Signed)
Pt wanted to know who to call to see how much she owes on her account to see if she can schedule an appt with Dr. Jarold Motto. Pt given the phone number for [employer] billing to call. States she will call back if she decides to schedule an appt.

## 2012-01-12 NOTE — Patient Instructions (Addendum)
I want you to keep stools soft so you do not need to strain to have a bowel movement You can use colace (stool softener) over the counter twice daily with lots of water Also miralax is helpful  anusol hc cream - use on hemorrhoid area twice daily for 2 weeks - I sent px to [facility]  If your symptoms do not improve in 1-2 weeks or if worse - please let me know

## 2012-01-30 ENCOUNTER — Emergency Department: Payer: Self-pay | Admitting: Unknown Physician Specialty

## 2012-01-30 LAB — CBC
HCT: 39.3 % (ref 35.0–47.0)
HGB: 13.6 g/dL (ref 12.0–16.0)
MCH: 28.2 pg (ref 26.0–34.0)
MCHC: 34.7 g/dL (ref 32.0–36.0)
Platelet: 266 10*3/uL (ref 150–440)
RBC: 4.83 10*6/uL (ref 3.80–5.20)
RDW: 13.4 % (ref 11.5–14.5)

## 2012-01-30 LAB — COMPREHENSIVE METABOLIC PANEL
Albumin: 3.6 g/dL (ref 3.4–5.0)
Alkaline Phosphatase: 127 U/L (ref 50–136)
Anion Gap: 6 — ABNORMAL LOW (ref 7–16)
Bilirubin,Total: 0.2 mg/dL (ref 0.2–1.0)
Calcium, Total: 9 mg/dL (ref 8.5–10.1)
Chloride: 104 mmol/L (ref 98–107)
Co2: 29 mmol/L (ref 21–32)
Creatinine: 0.92 mg/dL (ref 0.60–1.30)
EGFR (Non-African Amer.): >60
Glucose: 244 mg/dL — ABNORMAL HIGH (ref 65–99)
Osmolality: 286 (ref 275–301)
SGOT(AST): 35 U/L (ref 15–37)
SGPT (ALT): 36 U/L (ref 12–78)
Sodium: 139 mmol/L (ref 136–145)

## 2012-01-30 LAB — TROPONIN I: Troponin-I: <0.02 ng/mL

## 2012-02-03 ENCOUNTER — Encounter: Payer: Self-pay | Admitting: Family Medicine

## 2012-02-03 ENCOUNTER — Ambulatory Visit (INDEPENDENT_AMBULATORY_CARE_PROVIDER_SITE_OTHER): Payer: PRIVATE HEALTH INSURANCE | Admitting: Family Medicine

## 2012-02-03 VITALS — BP 110/78 | HR 80 | Temp 97.9°F | Ht 66.0 in | Wt 242.0 lb

## 2012-02-03 DIAGNOSIS — G5620 Lesion of ulnar nerve, unspecified upper limb: Secondary | ICD-10-CM | POA: Insufficient documentation

## 2012-02-03 DIAGNOSIS — R42 Dizziness and giddiness: Secondary | ICD-10-CM | POA: Insufficient documentation

## 2012-02-03 DIAGNOSIS — J069 Acute upper respiratory infection, unspecified: Secondary | ICD-10-CM

## 2012-02-03 MED ORDER — FLUTICASONE PROPIONATE 50 MCG/ACT NA SUSP: 2.0000 | Freq: Every day | NASAL | Status: DC

## 2012-02-03 NOTE — Patient Instructions (Addendum)
Start back on flonase nasal spray once daily to help open up sinuses Drink lots of water  Use warm compress on your sinuses Update if not starting to improve in a week or if worsening   Get a carpal tunnel wrist splint for your hand symptoms, let me know if that does not help

## 2012-02-03 NOTE — Assessment & Plan Note (Signed)
Adv to get carpal tunnel type wrist splint from the drugstore to wear for work/ and as needed for sleep  Pt has a lot of repedative  movement with arm / hand  Update if not starting to improve in a week or if worsening

## 2012-02-03 NOTE — Progress Notes (Signed)
Subjective:    Patient ID: Alexa Woods, female    DOB: 12-09-1960, 51 y.o.   MRN: [id number]  HPI Here for f/u of dizziness/ ear problem  Started Saturday- was dizzy and did not feel well  Asthma bothered her too  Went to [facility] er- dx with L TM effusion/ URI / and vertigo  Gave her azithromycin , ? Meclizine , had flonase in the past but not lately      L hand - medial 2 fingers numb on and off ongoing ? Carpal tunnel A lot of repeditive motion with that hand  No swelling  Patient Active Problem List  Diagnosis  . HERPES ZOSTER, UNCOMPLICATED  . DIABETES MELLITUS, TYPE II  . HYPOKALEMIA  . MORBID OBESITY  . ANXIETY  . History of alcohol abuse  . DEPRESSION  . HYPERTENSION  . HEMORRHOIDS  . ALLERGIC RHINITIS  . ASTHMA  . GERD  . CYSTITIS, CHRONIC  . PSORIASIS  . PATELLO-FEMORAL SYNDROME  . EPIGASTRIC PAIN  . BACK PAIN  . DYSPHAGIA UNSPECIFIED  . Shoulder pain, left  . RLQ abdominal pain  . Thrombocytopenia  . Yeast infection involving the vagina and surrounding area  . Lung density on x-ray  . Cough  . Dysuria  . Cellulitis of nostril  . Foot pain  . Preoperative clearance  . Left foot pain  . Gastritis  . Bronchitis  . Sinusitis, bacterial  . Contact dermatitis  . Stress reaction   Past Medical History  Diagnosis Date  . Epigastric abdominal pain   . Alcohol abuse, unspecified   . Allergic rhinitis, cause unspecified   . Anxiety state, unspecified   . Unspecified asthma   . Backache, unspecified   . Other chronic cystitis   . Depressive disorder, not elsewhere classified   . Type II or unspecified type diabetes mellitus without mention of complication, not stated as uncontrolled   . Dysphagia, unspecified   . Esophageal reflux   . Unspecified hemorrhoids without mention of complication   . Herpes zoster without mention of complication   . Unspecified essential hypertension   . Hypopotassemia   . Morbid obesity   . Other screening mammogram    . Pain in joint, lower leg   . Other psoriasis   . History of Bell's palsy 4/09  . Wrist fracture   . Edema   . Gastroparesis   . Myocardial infarction    Past Surgical History  Procedure Date  . Appendectomy   . Cholecystectomy   . Partial hysterectomy   . Knee arthroscopy w/ partial medial meniscectomy 10/2008    and chondroplasty  . Total knee arthroplasty   . Esophagogastroduodenoscopy 07/2002    erythematous gastropathy  . Shoulder surgery 05/2010  . Dobutamine stress echo 2009     normal stress echo, no evidence of ischemia   History  Substance Use Topics  . Smoking status: Former Smoker    Quit date: 12/30/2001  . Smokeless tobacco: Never Used  . Alcohol Use: No     former alcoholic    Family History  Problem Relation Age of Onset  . Alcohol abuse Mother   . Hypertension Mother   . Esophageal cancer Maternal Grandmother    Allergies  Allergen Reactions  . Bupropion Hcl   . Cefuroxime Axetil Nausea Only  . Codeine     REACTION: nausea and vomiting, rash  . Lidocaine     REACTION: unknown  . Metformin     REACTION: GI  .  Omeprazole     REACTION: does not work for her  . Paroxetine     REACTION: doesn't agree  . Propoxyphene-Acetaminophen     REACTION: wheezing  . Sulfonamide Derivatives     REACTION: rash  . Tramadol Hcl     REACTION: Causes Anxiety   Current Outpatient Prescriptions on File Prior to Visit  Medication Sig Dispense Refill  . albuterol (VENTOLIN HFA) 108 (90 BASE) MCG/ACT inhaler Inhale 2 puffs into the lungs every 4 (four) hours as needed.  1 Inhaler  3  . chlorpheniramine-HYDROcodone (TUSSIONEX) 10-8 MG/5ML LQCR Take 5 mLs by mouth at bedtime as needed.  240 mL  0  . dexlansoprazole (DEXILANT) 60 MG capsule Take 1 capsule (60 mg total) by mouth daily.  30 capsule  6  . fluticasone (FLONASE) 50 MCG/ACT nasal spray Place 2 sprays into the nose daily as needed.        . Fluticasone-Salmeterol (ADVAIR DISKUS) 250-50 MCG/DOSE AEPB  Inhale 1 puff into the lungs 2 (two) times daily.  60 each  0  . glucose blood (ONE TOUCH ULTRA TEST) test strip Use as directed daily  100 each  6  . hydrochlorothiazide (HYDRODIURIL) 25 MG tablet Take 1 tablet (25 mg total) by mouth daily.  30 tablet  11  . hydrocortisone (ANUSOL-HC) 2.5 % rectal cream Place rectally 2 (two) times daily. No more than 14 days in a row  30 g  1  . insulin aspart (NOVOLOG FLEXPEN) 100 UNIT/ML injection Sliding scale as directed  1 pen  0  . insulin glargine (LANTUS SOLOSTAR) 100 UNIT/ML injection Inject 50 Units into the skin at bedtime.      . Insulin Pen Needle (EASY TOUCH PEN NEEDLES) 31G X 8 MM MISC Use as directed twice daily  100 each  2  . L-Methylfolate (DEPLIN) 15 MG TABS Take 1 capsule by mouth daily.        . Lancets (ONETOUCH ULTRASOFT) lancets Use as directed daily.  100 each  6  . LANTUS SOLOSTAR 100 UNIT/ML injection INJECT 50 UNITS INTO THE SKIN EVERY     MORNING  30 mL  2  . LORazepam (ATIVAN) 1 MG tablet Take 1 mg by mouth as needed.        . mupirocin ointment (BACTROBAN) 2 % Apply to affected area in each nostril twice daily for 10 days  15 g  0  . olopatadine (PATANOL) 0.1 % ophthalmic solution Place 2 drops into both eyes 3 (three) times daily as needed.        Marland Kitchen oxyCODONE-acetaminophen (PERCOCET) 10-325 MG per tablet Take 1 tablet by mouth every 6 (six) hours as needed. For pain       . potassium chloride (KLOR-CON) 10 MEQ CR tablet Take 1 tablet (10 mEq total) by mouth daily.  60 tablet  6  . predniSONE (DELTASONE) 20 MG tablet 3 tabs by mouth daily x 3 days, then 2 tabs by mouth daily x 2 days then 1 tab by mouth daily x 2 days  15 tablet  0  . sertraline (ZOLOFT) 100 MG tablet Take 100 mg by mouth 2 (two) times daily.        Marland Kitchen tiZANidine (ZANAFLEX) 4 MG tablet Take 4 mg by mouth every 8 (eight) hours as needed. For muscle spasm       . zolpidem (AMBIEN) 10 MG tablet Take 15 mg by mouth at bedtime as needed.  Review of  Systems    Review of Systems  Constitutional: Negative for fever, appetite change,  and unexpected weight change pos for fatigue and gen malaise.  Eyes: Negative for pain and visual disturbance.  ENT pos for nasal cong/ rhinorrhea/ sinus fullness and ear fullness/ neg for ear pain  Respiratory: Negative for wheeze or sob , pos for cough   Cardiovascular: Negative for cp or palpitations    Gastrointestinal: Negative for nausea, diarrhea and constipation.  Genitourinary: Negative for urgency and frequency.  Skin: Negative for pallor or rash   MSK pos for chronic back pain  Neurological: Negative for weakness, light-headedness,and headaches.  Hematological: Negative for adenopathy. Does not bruise/bleed easily.  Psychiatric/Behavioral: Negative for dysphoric mood. The patient is not nervous/anxious.      Objective:   Physical Exam  Constitutional: She appears well-developed and well-nourished. No distress.  HENT:  Head: Normocephalic and atraumatic.  Right Ear: External ear normal.  Mouth/Throat: Oropharynx is clear and moist. No oropharyngeal exudate.       Nares are injected and congested  No facial tenderness No rash  TM L with small effusion   Eyes: Conjunctivae and EOM are normal. Pupils are equal, round, and reactive to light. Right eye exhibits no discharge. Left eye exhibits no discharge.       0-1 beat of horiz nystagmus bilat   Neck: Normal range of motion. Neck supple.  Cardiovascular: Normal rate and regular rhythm.   Pulmonary/Chest: Effort normal and breath sounds normal. No respiratory distress. She has no wheezes.  Abdominal: Soft. Bowel sounds are normal. She exhibits no distension. There is no tenderness.  Musculoskeletal: She exhibits no edema.  Lymphadenopathy:    She has no cervical adenopathy.  Neurological: She is alert. She has normal reflexes. She displays no atrophy. No cranial nerve deficit or sensory deficit. She exhibits normal muscle tone. Coordination  and gait normal.       States that R lateral 2 fingers always feel tingly  Skin: Skin is warm and dry. No rash noted. No erythema. No pallor.  Psychiatric: She exhibits a depressed mood.       Tired and down today          Assessment & Plan:

## 2012-02-03 NOTE — Assessment & Plan Note (Signed)
Not improved much with zpak from ER- so suspect viral Disc symptomatic care - see instructions on AVS  Update if not starting to improve in a week or if worsening   Not wheezing today-that is reassuring

## 2012-02-03 NOTE — Assessment & Plan Note (Signed)
With L TM effusion and viral uri Will add back flonase ns daily to help open sinuses ET up  Update if not starting to improve in a week or if worsening   Reassuring exam today

## 2012-04-12 ENCOUNTER — Other Ambulatory Visit: Payer: Self-pay | Admitting: Family Medicine

## 2012-04-18 ENCOUNTER — Telehealth: Payer: Self-pay | Admitting: Family Medicine

## 2012-04-18 NOTE — Telephone Encounter (Signed)
Patient Information:  Caller Name: Indy  Phone: [phone]  [name], Alexa Woods  Gender: Female  DOB: [birth year]  Age: 51 Years  PCP: Roxy Manns [facility])  Pregnant: No   Symptoms  Reason For Call & Symptoms: asthma symptoms-cough, wheezing worsening for 1 week  Reviewed Health History In EMR: Yes  Reviewed Medications In EMR: Yes  Reviewed Allergies In EMR: Yes  Date of Onset of Symptoms: 04/11/2012  Treatments Tried: Albuterol  Treatments Tried Worked: Yes OB:  LMP: 12/30/2001  Guideline(s) Used:  Asthma Attack  Disposition Per Guideline:   See Today or Tomorrow in Office  Reason For Disposition Reached:   Mild asthma attack (e.g., no SOB at rest, mild SOB with walking, speaks normally in sentences, mild wheezing) and persists > 24 hours on appropriate treatment  Advice Given:  N/A  Office Follow Up:  Does the office need to follow up with this patient?: No  Instructions For The Office: N/A  Appointment Scheduled:  04/19/2012 08:30:00

## 2012-04-19 ENCOUNTER — Encounter: Payer: Self-pay | Admitting: Family Medicine

## 2012-04-19 ENCOUNTER — Ambulatory Visit (INDEPENDENT_AMBULATORY_CARE_PROVIDER_SITE_OTHER): Payer: PRIVATE HEALTH INSURANCE | Admitting: Family Medicine

## 2012-04-19 VITALS — BP 110/68 | HR 82 | Temp 97.9°F | Ht 66.0 in | Wt 246.0 lb

## 2012-04-19 DIAGNOSIS — Z23 Encounter for immunization: Secondary | ICD-10-CM

## 2012-04-19 DIAGNOSIS — E119 Type 2 diabetes mellitus without complications: Secondary | ICD-10-CM

## 2012-04-19 DIAGNOSIS — J45909 Unspecified asthma, uncomplicated: Secondary | ICD-10-CM

## 2012-04-19 LAB — COMPREHENSIVE METABOLIC PANEL
Albumin: 3.9 g/dL (ref 3.5–5.2)
Alkaline Phosphatase: 108 U/L (ref 39–117)
BUN: 17 mg/dL (ref 6–23)
Glucose, Bld: 294 mg/dL — ABNORMAL HIGH (ref 70–99)
Potassium: 3.7 mEq/L (ref 3.5–5.1)
Total Bilirubin: 0.4 mg/dL (ref 0.3–1.2)

## 2012-04-19 LAB — MICROALBUMIN / CREATININE URINE RATIO
Creatinine,U: 67.8 mg/dL
Microalb, Ur: 0.2 mg/dL (ref 0.0–1.9)

## 2012-04-19 NOTE — Assessment & Plan Note (Signed)
Worse with seasonal allergies - but reassuring exam today Will maximize advair and use BID Then rescue inhaler as needed F/u 3 mo  If not improved- consider singulair or spiriva or inc advair dose

## 2012-04-19 NOTE — Assessment & Plan Note (Signed)
Per pt doing much better  See AVS for inst Lab today  Cannot afford to return to Dr Everardo All or get eye exam right now

## 2012-04-19 NOTE — Progress Notes (Signed)
Subjective:    Patient ID: Alexa Woods, female    DOB: 10-06-1960, 51 y.o.   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 - Dr Amanda Pea  Had to resign from her job due to this problem  Is going to file for disability with her orthopedic (also back problem and deg arthritis) - Dr Ethelene Hal    Blood sugars have generally been below 200  Checks it in ams -probably averages 120s   (this am was 106) - not checking any pm sugars Lab Results  Component Value Date   HGBA1C 8.9* 07/24/2011    Is now on novolog SS and lantus   (not really doing SS insulin because of low sugars lately)  The lantus is really helping (levimir did not work as well)  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 DIABETES MELLITUS, TYPE II  . HYPOKALEMIA  . MORBID OBESITY  . ANXIETY  . History of alcohol abuse  . DEPRESSION  . HYPERTENSION  . HEMORRHOIDS  . ALLERGIC RHINITIS  . ASTHMA  . GERD  . CYSTITIS, CHRONIC  . PSORIASIS  . PATELLO-FEMORAL SYNDROME  . BACK PAIN  . DYSPHAGIA UNSPECIFIED  . Thrombocytopenia  . Lung density on x-ray  . Cough  . Dysuria  . Contact dermatitis  . Stress reaction  . Viral URI  . Vertigo  . 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 Epigastric abdominal pain     . Alcohol abuse, unspecified   . Allergic rhinitis, cause unspecified   . Anxiety state, unspecified   . Unspecified asthma   . Backache, unspecified   . Other chronic cystitis   . Depressive disorder, not elsewhere classified   . Type II or unspecified type diabetes mellitus without mention of complication, not stated as uncontrolled   . Dysphagia, unspecified   . Esophageal reflux   . Unspecified hemorrhoids without mention of complication   . Herpes zoster without mention of complication   . Unspecified essential hypertension   . Hypopotassemia   . Morbid obesity   . Other screening mammogram   . Pain in joint, lower leg   . Other psoriasis   . History of Bell's palsy 4/09  . Wrist fracture   . Edema   . Gastroparesis   . Myocardial infarction    Past Surgical History  Procedure Date  . Appendectomy   . Cholecystectomy   . Partial hysterectomy   . Knee arthroscopy w/ partial medial meniscectomy 10/2008    and chondroplasty  . Total knee arthroplasty   . Esophagogastroduodenoscopy 07/2002  erythematous gastropathy  . Shoulder surgery 05/2010  . Dobutamine stress echo 2009     normal stress echo, no evidence of ischemia   History  Substance Use Topics  . Smoking status: Former Smoker    Quit date: 12/30/2001  . Smokeless tobacco: Never Used  . Alcohol Use: No     Comment: former alcoholic    Family History  Problem Relation Age of Onset  . Alcohol abuse Mother   . Hypertension Mother   . Esophageal cancer Maternal Grandmother    Allergies  Allergen Reactions  . Bupropion Hcl   . Cefuroxime Axetil Nausea Only  . Codeine     REACTION: nausea and vomiting, rash  . Lidocaine     REACTION: unknown  . Metformin     REACTION: GI  . Omeprazole     REACTION: does not work for her  . Paroxetine     REACTION: doesn't agree  . Propoxyphene-Acetaminophen     REACTION: wheezing  . Sulfonamide Derivatives     REACTION: rash  . Tramadol Hcl     REACTION:  Causes Anxiety   Current Outpatient Prescriptions on File Prior to Visit  Medication Sig Dispense Refill  . albuterol (VENTOLIN HFA) 108 (90 BASE) MCG/ACT inhaler Inhale 2 puffs into the lungs every 4 (four) hours as needed.  1 Inhaler  3  . chlorpheniramine-HYDROcodone (TUSSIONEX) 10-8 MG/5ML LQCR Take 5 mLs by mouth at bedtime as needed.  240 mL  0  . Fluticasone-Salmeterol (ADVAIR DISKUS) 250-50 MCG/DOSE AEPB Inhale 1 puff into the lungs 2 (two) times daily.  60 each  0  . glucose blood (ONE TOUCH ULTRA TEST) test strip Use as directed daily  100 each  6  . hydrochlorothiazide (HYDRODIURIL) 25 MG tablet Take 1 tablet (25 mg total) by mouth daily.  30 tablet  11  . insulin aspart (NOVOLOG FLEXPEN) 100 UNIT/ML injection Sliding scale as directed  1 pen  0  . insulin glargine (LANTUS SOLOSTAR) 100 UNIT/ML injection Inject 50 Units into the skin at bedtime.      . Insulin Pen Needle (EASY TOUCH PEN NEEDLES) 31G X 8 MM MISC Use as directed twice daily  100 each  2  . Lancets (ONETOUCH ULTRASOFT) lancets Use as directed daily.  100 each  6  . LANTUS SOLOSTAR 100 UNIT/ML injection INJECT 50 UNITS INTO THE SKIN EVERY     MORNING  30 mL  2  . LORazepam (ATIVAN) 1 MG tablet Take 1 mg by mouth as needed.        Marland Kitchen olopatadine (PATANOL) 0.1 % ophthalmic solution Place 2 drops into both eyes 3 (three) times daily as needed.        Marland Kitchen oxyCODONE-acetaminophen (PERCOCET) 10-325 MG per tablet Take 1 tablet by mouth every 6 (six) hours as needed. For pain       . potassium chloride (K-DUR,KLOR-CON) 10 MEQ tablet Take 1 tablet (10 mEq total) by mouth daily.  60 tablet  0  . sertraline (ZOLOFT) 100 MG tablet Take 100 mg by mouth 2 (two) times daily.        Marland Kitchen tiZANidine (ZANAFLEX) 4 MG tablet Take 4 mg by mouth every 8 (eight) hours as needed. For muscle spasm       . zolpidem (AMBIEN) 10 MG tablet Take 15 mg by mouth at bedtime as needed.       . fluticasone (FLONASE) 50 MCG/ACT nasal spray Place 2 sprays into the  nose daily as needed.        . [  DISCONTINUED] fluticasone (FLONASE) 50 MCG/ACT nasal spray Place 2 sprays into the nose daily.  16 g  6  . [DISCONTINUED] potassium chloride (KLOR-CON) 10 MEQ CR tablet Take 1 tablet (10 mEq total) by mouth daily.  60 tablet  6        Review of Systems Review of Systems  Constitutional: Negative for fever, appetite change, fatigue and unexpected weight change.  Eyes: Negative for pain and visual disturbance.  Respiratory: Negative for cough and shortness of breath.  pos for intermittent wheezing  Cardiovascular: Negative for cp or palpitations    Gastrointestinal: Negative for nausea, diarrhea and constipation.  Genitourinary: Negative for urgency and frequency. neg for excessive thirst  Skin: Negative for pallor or rash   Neurological: Negative for weakness, light-headedness, numbness and headaches.  Hematological: Negative for adenopathy. Does not bruise/bleed easily.  Psychiatric/Behavioral: Negative for dysphoric mood. The patient is not nervous/anxious.         Objective:   Physical Exam  Constitutional: She appears well-developed and well-nourished. No distress.  HENT:  Head: Normocephalic and atraumatic.  Nose: Nose normal.  Mouth/Throat: Oropharynx is clear and moist. No oropharyngeal exudate.  Eyes: Conjunctivae normal and EOM are normal. Pupils are equal, round, and reactive to light. Right eye exhibits no discharge. Left eye exhibits no discharge. No scleral icterus.  Neck: Normal range of motion. Neck supple. No JVD present. Carotid bruit is not present. No thyromegaly present.  Cardiovascular: Normal rate, regular rhythm, normal heart sounds and intact distal pulses.   Pulmonary/Chest: Effort normal and breath sounds normal. No respiratory distress. She has no wheezes. She has no rales. She exhibits no tenderness.       No wheeze today even on forced expiration  Not sob   Abdominal: Soft. Bowel sounds are normal. She exhibits no  distension, no abdominal bruit and no mass. There is no tenderness.  Musculoskeletal: She exhibits no edema.  Lymphadenopathy:    She has no cervical adenopathy.  Neurological: She is alert. She has normal reflexes. No cranial nerve deficit. She exhibits normal muscle tone. Coordination normal.  Skin: Skin is warm and dry. No rash noted. No erythema. No pallor.  Psychiatric:       Affect is somethat flat - but answers questions appropriately She is generally stressed           Assessment & Plan:

## 2012-04-19 NOTE — Patient Instructions (Addendum)
Eat a Diabetic diet and check sugar am and pm - keep a log  Stay as active as you can Get your eye exam when you can afford it  Labs today  For asthma - use advair TWICE DAILY - do not forget - am and pm  Use your rescue inhaler - up to every 4 hours as needed -- only when needed Update me in about 2 weeks with how that is working - we have other options  Get back to Dr Everardo All for your diabetes when you can afford it - if not- follow up with me in about 3 months

## 2012-04-21 NOTE — Assessment & Plan Note (Signed)
Discussed how this problem influences overall health and the risks it imposes  Reviewed plan for weight loss with lower calorie diet (via better food choices and also portion control or program like weight watchers) and exercise building up to or more than 30 minutes 5 days per week including some aerobic activity   Pt is aware that wt loss is important for DM control and also joint problems Stressed imp of lower calorie DM diet Exercise is a challenge with recent orthopedic problems- chair exercise may be and option

## 2012-04-25 ENCOUNTER — Telehealth: Payer: Self-pay

## 2012-04-25 NOTE — Telephone Encounter (Signed)
Sarah with [facility] request most recent OV and labs faxed to [phone].Please advise.

## 2012-04-25 NOTE — Telephone Encounter (Signed)
OV and labs faxed.

## 2012-04-25 NOTE — Telephone Encounter (Signed)
Please send the note and lab report-thanks

## 2012-05-23 ENCOUNTER — Encounter: Payer: Self-pay | Admitting: Family Medicine

## 2012-05-23 ENCOUNTER — Ambulatory Visit (INDEPENDENT_AMBULATORY_CARE_PROVIDER_SITE_OTHER): Payer: PRIVATE HEALTH INSURANCE | Admitting: Family Medicine

## 2012-05-23 VITALS — BP 130/84 | HR 95 | Temp 98.6°F | Ht 66.0 in | Wt 250.5 lb

## 2012-05-23 DIAGNOSIS — R21 Rash and other nonspecific skin eruption: Secondary | ICD-10-CM

## 2012-05-23 DIAGNOSIS — J45901 Unspecified asthma with (acute) exacerbation: Secondary | ICD-10-CM

## 2012-05-23 DIAGNOSIS — E119 Type 2 diabetes mellitus without complications: Secondary | ICD-10-CM

## 2012-05-23 MED ORDER — AZITHROMYCIN 250 MG PO TABS: ORAL_TABLET | ORAL | Status: DC

## 2012-05-23 MED ORDER — TRIAMCINOLONE ACETONIDE 0.5 % EX CREA: TOPICAL_CREAM | Freq: Two times a day (BID) | CUTANEOUS | Status: DC

## 2012-05-23 MED ORDER — HYDROCODONE-HOMATROPINE 5-1.5 MG/5ML PO SYRP: 5.0000 mL | ORAL_SOLUTION | Freq: Three times a day (TID) | ORAL | Status: DC | PRN

## 2012-05-23 NOTE — Assessment & Plan Note (Signed)
Poor control. Increase Lantus by 5 Units.Follow up in 1 week with PCP. Bring in measurements.

## 2012-05-23 NOTE — Assessment & Plan Note (Signed)
Will treat with topical steroid. If not improving return to dermatology.

## 2012-05-23 NOTE — Assessment & Plan Note (Signed)
Increase advair to twice daily. Will hold on prednisone as peak flow adequate, but if worseing will start.  Given recent hospitalization.. Will treat with antibiotics.  Follow up closely in 1 week with PCP.

## 2012-05-23 NOTE — Progress Notes (Signed)
Subjective:    Patient ID: Alexa Woods, female    DOB: 1960-12-10, 51 y.o.   MRN: [id number]  Cough This is a new problem. The current episode started in the past 7 days (sneeze and cough 3 days then worse in last 24 hours). The problem has been gradually worsening. The problem occurs constantly. The cough is productive of sputum. Associated symptoms include ear congestion, ear pain, nasal congestion, a sore throat, shortness of breath and wheezing. Pertinent negatives include no chest pain, fever, myalgias or postnasal drip. Associated symptoms comments: Left ear pain. The symptoms are aggravated by fumes and other (secondary smoking). Risk factors: hx of smoking but quit 10 years ago. She has tried OTC cough suppressant and a beta-agonist inhaler (unsing albuterol once a day, using advair only once a day following surg, she was afraid it would be worse) for the symptoms. The treatment provided moderate relief. Her past medical history is significant for asthma and environmental allergies.   Appt 1 month ago with PCP: [facility] - Roxy Manns, MD 04/19/2012 9:12 AM Signed  Worse with seasonal allergies - but reassuring exam today  Will maximize advair and use BID  Then rescue inhaler as needed  F/u 3 mo  If not improved- consider singulair or spiriva or inc advair dose  Blood sugars have been  Elevated since hospitalization. She uses LAntus 50 Untis and novolog SSI.  FBS 160-300, usually around 200s. No CBGs <60.  When she is under stress she has a flare of tender itchy nodules, dry flaky on hands and feet.   Review of Systems  Constitutional: Negative for fever.  HENT: Positive for ear pain and sore throat. Negative for postnasal drip.   Respiratory: Positive for cough, shortness of breath and wheezing.   Cardiovascular: Negative for chest pain.  Musculoskeletal: Negative for myalgias.  Hematological: Positive for environmental allergies.       Objective:   Physical Exam   Constitutional: Vital signs are normal. She appears well-developed and well-nourished. She is cooperative.  Non-toxic appearance. She does not appear ill. No distress.  HENT:  Head: Normocephalic.  Right Ear: Hearing, tympanic membrane, external ear and ear canal normal. Tympanic membrane is not erythematous, not retracted and not bulging.  Left Ear: Hearing, tympanic membrane, external ear and ear canal normal. Tympanic membrane is not erythematous, not retracted and not bulging.  Nose: Mucosal edema and rhinorrhea present. Right sinus exhibits no maxillary sinus tenderness and no frontal sinus tenderness. Left sinus exhibits no maxillary sinus tenderness and no frontal sinus tenderness.  Mouth/Throat: Uvula is midline, oropharynx is clear and moist and mucous membranes are normal.  Eyes: Conjunctivae normal, EOM and lids are normal. Pupils are equal, round, and reactive to light. No foreign bodies found.  Neck: Trachea normal and normal range of motion. Neck supple. Carotid bruit is not present. No mass and no thyromegaly present.  Cardiovascular: Normal rate, regular rhythm, S1 normal, S2 normal, normal heart sounds, intact distal pulses and normal pulses.  Exam reveals no gallop and no friction rub.   No murmur heard. Pulmonary/Chest: Effort normal. Not tachypneic. No respiratory distress. She has decreased breath sounds. She has wheezes. She has no rhonchi. She has no rales.       Scattered wheeze  Neurological: She is alert.  Skin: Skin is warm, dry and intact. Rash noted.       Dry flaky rash on B hands, occ blisters.  Psychiatric: Her speech is normal and behavior is normal. Judgment  normal. Her mood appears not anxious. Cognition and memory are normal. She does not exhibit a depressed mood.          Assessment & Plan:

## 2012-05-23 NOTE — Patient Instructions (Addendum)
Start and complete azithromycin, an antibiotic.  Increase advair as Dr. Milinda Antis instructed to twice a day to help with asthma flare.  We will hold off on prednisone as peak flows are adequate.  Call if wheezing or shortness of breath increasing. Increase Lantus to 55 Units daily. Continue sliding scale insulin Keep follow up with Dr. Milinda Antis on 12/30.

## 2012-05-30 ENCOUNTER — Ambulatory Visit (INDEPENDENT_AMBULATORY_CARE_PROVIDER_SITE_OTHER): Payer: PRIVATE HEALTH INSURANCE | Admitting: Family Medicine

## 2012-05-30 ENCOUNTER — Encounter: Payer: Self-pay | Admitting: Family Medicine

## 2012-05-30 VITALS — BP 122/80 | HR 69 | Temp 98.1°F | Ht 66.0 in | Wt 251.0 lb

## 2012-05-30 DIAGNOSIS — M62838 Other muscle spasm: Secondary | ICD-10-CM

## 2012-05-30 DIAGNOSIS — E119 Type 2 diabetes mellitus without complications: Secondary | ICD-10-CM

## 2012-05-30 MED ORDER — INSULIN GLARGINE 100 UNIT/ML ~~LOC~~ SOLN: 60.0000 [IU] | Freq: Every day | SUBCUTANEOUS | Status: DC

## 2012-05-30 NOTE — Progress Notes (Signed)
Subjective:    Patient ID: Alexa Woods, female    DOB: 1961/03/27, 51 y.o.   MRN: [id number]  HPI Here for f/u of DM2 - on lantus and novolog  Has a spasm in her R shoulder/trap area  Had hand surgery-was compensating for that   Has been sick - had GI virus- and that ran her sugars up   Not on prednisone   Can no longer afford to see endocrinologist Wt is stable with bmi of 40  Labs 11/19- a1c 8.6 down from 8.9  Diabetes Home sugar results am fasting 130s-180s PM pp- 170s to 200s  DM diet - is not eating much at all due to recent illness , tries to stick to healthy diet as much as she can Needs to eat more veg and less carbs  Exercise -none lately , not up to it yet Symptoms-none  A1C last  Lab Results  Component Value Date   HGBA1C 8.6* 04/19/2012  down a little from 8.9   No problems with medications  Renal protection Last eye exam    On lantus 50 u- that is stable  novolog - she uses only if she is over 200  Trying to save strips since she is not working   microalbumin ok   Patient Active Problem List  Diagnosis  . DIABETES MELLITUS, TYPE II  . HYPOKALEMIA  . MORBID OBESITY  . ANXIETY  . History of alcohol abuse  . DEPRESSION  . HYPERTENSION  . HEMORRHOIDS  . ALLERGIC RHINITIS  . ASTHMA  . GERD  . CYSTITIS, CHRONIC  . PSORIASIS  . PATELLO-FEMORAL SYNDROME  . BACK PAIN  . DYSPHAGIA UNSPECIFIED  . Thrombocytopenia  . Lung density on x-ray  . Vertigo  . 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 Rash of hands  . Asthma with acute exacerbation   Past Medical History  Diagnosis Date  . Epigastric abdominal pain   . Alcohol abuse, unspecified   . Allergic rhinitis, cause unspecified   . Anxiety state, unspecified   . Unspecified asthma   . Backache, unspecified   . Other chronic cystitis   . Depressive disorder, not elsewhere classified   . Type II or unspecified type diabetes mellitus without mention of complication, not stated as uncontrolled    . Dysphagia, unspecified   . Esophageal reflux   . Unspecified hemorrhoids without mention of complication   . Herpes zoster without mention of complication   . Unspecified essential hypertension   . Hypopotassemia   . Morbid obesity   . Other screening mammogram   . Pain in joint, lower leg   . Other psoriasis   . History of Bell's palsy 4/09  . Wrist fracture   . Edema   . Gastroparesis   . Myocardial infarction    Past Surgical History  Procedure Date  . Appendectomy   . Cholecystectomy   . Partial hysterectomy   . Knee arthroscopy w/ partial medial meniscectomy 10/2008    and chondroplasty  . Total knee arthroplasty   . Esophagogastroduodenoscopy 07/2002    erythematous gastropathy  . Shoulder surgery 05/2010  . Dobutamine stress echo 2009     normal stress echo, no evidence of ischemia   History  Substance Use Topics  . Smoking status: Former Smoker    Quit date: 12/30/2001  . Smokeless tobacco: Never Used  . Alcohol Use: No     Comment: former alcoholic    Family History  Problem Relation Age of  Onset  . Alcohol abuse Mother   . Hypertension Mother   . Esophageal cancer Maternal Grandmother    Allergies  Allergen Reactions  . Bupropion Hcl   . Cefuroxime Axetil Nausea Only  . Codeine     REACTION: nausea and vomiting, rash  . Lidocaine     REACTION: unknown  . Metformin     REACTION: GI  . Omeprazole     REACTION: does not work for her  . Paroxetine     REACTION: doesn't agree  . Propoxyphene-Acetaminophen     REACTION: wheezing  . Sulfonamide Derivatives     REACTION: rash  . Tramadol Hcl     REACTION: Causes Anxiety   Current Outpatient Prescriptions on File Prior to Visit  Medication Sig Dispense Refill  . albuterol (VENTOLIN HFA) 108 (90 BASE) MCG/ACT inhaler Inhale 2 puffs into the lungs every 4 (four) hours as needed.  1 Inhaler  3  . azithromycin (ZITHROMAX) 250 MG tablet 2 tab po x 1 then 1 tab po daily  6 tablet  0  . docusate  sodium (COLACE) 50 MG capsule Take by mouth daily.      . fluticasone (FLONASE) 50 MCG/ACT nasal spray Place 2 sprays into the nose daily as needed.        . Fluticasone-Salmeterol (ADVAIR DISKUS) 250-50 MCG/DOSE AEPB Inhale 1 puff into the lungs 2 (two) times daily.  60 each  0  . glucose blood (ONE TOUCH ULTRA TEST) test strip Use as directed daily  100 each  6  . hydrochlorothiazide (HYDRODIURIL) 25 MG tablet Take 1 tablet (25 mg total) by mouth daily.  30 tablet  11  . HYDROcodone-homatropine (HYCODAN) 5-1.5 MG/5ML syrup Take 5 mLs by mouth every 8 (eight) hours as needed for cough.  180 mL  0  . insulin aspart (NOVOLOG FLEXPEN) 100 UNIT/ML injection Sliding scale as directed  1 pen  0  . insulin glargine (LANTUS SOLOSTAR) 100 UNIT/ML injection Inject 50 Units into the skin at bedtime.      . Insulin Pen Needle (EASY TOUCH PEN NEEDLES) 31G X 8 MM MISC Use as directed twice daily  100 each  2  . Lancets (ONETOUCH ULTRASOFT) lancets Use as directed daily.  100 each  6  . LORazepam (ATIVAN) 1 MG tablet Take 1 mg by mouth as needed.        Marland Kitchen olopatadine (PATANOL) 0.1 % ophthalmic solution Place 2 drops into both eyes 3 (three) times daily as needed.        Marland Kitchen oxyCODONE-acetaminophen (PERCOCET) 10-325 MG per tablet Take 1 tablet by mouth every 6 (six) hours as needed. For pain       . potassium chloride (K-DUR,KLOR-CON) 10 MEQ tablet Take 1 tablet (10 mEq total) by mouth daily.  60 tablet  0  . sertraline (ZOLOFT) 100 MG tablet Take 100 mg by mouth 2 (two) times daily.        Marland Kitchen tiZANidine (ZANAFLEX) 4 MG tablet Take 4 mg by mouth every 8 (eight) hours as needed. For muscle spasm       . triamcinolone cream (KENALOG) 0.5 % Apply topically 2 (two) times daily. Apply to affected areas  30 g  1  . zolpidem (AMBIEN) 10 MG tablet Take 15 mg by mouth at bedtime as needed.       . [DISCONTINUED] potassium chloride (KLOR-CON) 10 MEQ CR tablet Take 1 tablet (10 mEq total) by mouth daily.  60 tablet  6  Review of Systems Review of Systems  Constitutional: Negative for fever, appetite change,  and unexpected weight change. pos for fatigue Eyes: Negative for pain and visual disturbance.  Respiratory: Negative for cough and shortness of breath.   Cardiovascular: Negative for cp or palpitations    Gastrointestinal: Negative for nausea, diarrhea and constipation.  Genitourinary: Negative for urgency and frequency. neg for excessive thirst  Skin: Negative for pallor or rash   MSK pos for back pain / neck pain, neg for swollen joints  Neurological: Negative for weakness, light-headedness, numbness and headaches.  Hematological: Negative for adenopathy. Does not bruise/bleed easily.  Psychiatric/Behavioral: Negative for dysphoric mood. The patient is not nervous/anxious.  pos for stressors        Objective:   Physical Exam  Constitutional: She appears well-developed and well-nourished. No distress.       obese and well appearing   HENT:  Head: Normocephalic and atraumatic.  Mouth/Throat: Oropharynx is clear and moist.  Eyes: Conjunctivae normal and EOM are normal. Pupils are equal, round, and reactive to light. No scleral icterus.  Neck: Normal range of motion. Neck supple. No JVD present. Carotid bruit is not present. No thyromegaly present.  Cardiovascular: Normal rate, regular rhythm and intact distal pulses.   Pulmonary/Chest: Effort normal and breath sounds normal. No respiratory distress. She has no wheezes. She has no rales.  Abdominal: Soft. Bowel sounds are normal. She exhibits no distension, no abdominal bruit and no mass. There is no tenderness.  Musculoskeletal: She exhibits tenderness. She exhibits no edema.       Tender over R paracervical musculature and also medial to R scapula with spasm Nl rom shoulder/ neck and back with discomfort   Lymphadenopathy:    She has no cervical adenopathy.  Neurological: She is alert. She has normal reflexes. No cranial nerve  deficit. She exhibits normal muscle tone. Coordination normal.  Skin: Skin is warm and dry. No rash noted. No erythema. No pallor.  Psychiatric: She has a normal mood and affect.          Assessment & Plan:

## 2012-05-30 NOTE — Assessment & Plan Note (Signed)
On R side Reassuring exam Will continue muscle relaxer tid and use heat Enc to stretch and keep it moving  She will let her physical therapists know about it also

## 2012-05-30 NOTE — Assessment & Plan Note (Signed)
Will increase lantus to 60 units for 2 weeks and if sugars are still high , go up to 70 as tolerated Will send sugar results and f/u 3 months after labs  a1c is 8.6 Disc diet and exercise as tolerated

## 2012-05-30 NOTE — Patient Instructions (Addendum)
You have a muscle spasm in your shoulder area Continue your muscle relaxer  Use heat as often as you can to relax the muscle  Try to massage the area around the shoulder blade also  This should get better on its own Increase your lantus to 60 units at bedtime, if sugar is still high after 2 weeks - go up to 70 units - and then send me some blood sugar results Work on healthy diet and exercise Schedule fasting lab and then follow up in 3 months

## 2012-06-02 ENCOUNTER — Other Ambulatory Visit: Payer: Self-pay

## 2012-06-02 NOTE — Telephone Encounter (Signed)
Pt left v/m requesting refill for lantus; spoke with [place] at [facility] and she said pt had already picked up rx. I spoke with pt and she said her son picked up lantus while she left v/m for refill. Confirmed pt does have lantus now.

## 2012-06-13 ENCOUNTER — Other Ambulatory Visit: Payer: Self-pay | Admitting: Family Medicine

## 2012-08-02 ENCOUNTER — Ambulatory Visit (INDEPENDENT_AMBULATORY_CARE_PROVIDER_SITE_OTHER): Payer: PRIVATE HEALTH INSURANCE | Admitting: Family Medicine

## 2012-08-02 ENCOUNTER — Encounter: Payer: Self-pay | Admitting: Family Medicine

## 2012-08-02 VITALS — BP 104/68 | HR 88 | Temp 98.9°F | Ht 66.0 in | Wt 254.8 lb

## 2012-08-02 DIAGNOSIS — K2970 Gastritis, unspecified, without bleeding: Secondary | ICD-10-CM

## 2012-08-02 DIAGNOSIS — R071 Chest pain on breathing: Secondary | ICD-10-CM

## 2012-08-02 DIAGNOSIS — R0789 Other chest pain: Secondary | ICD-10-CM

## 2012-08-02 DIAGNOSIS — K2990 Gastroduodenitis, unspecified, without bleeding: Secondary | ICD-10-CM

## 2012-08-02 DIAGNOSIS — R109 Unspecified abdominal pain: Secondary | ICD-10-CM

## 2012-08-02 LAB — POCT UA - MICROSCOPIC ONLY
Bacteria, U Microscopic: 0
Crystals, Ur, HPF, POC: 0
WBC, Ur, HPF, POC: 0

## 2012-08-02 LAB — POCT URINALYSIS DIPSTICK
Bilirubin, UA: NEGATIVE
Glucose, UA: NEGATIVE
Ketones, UA: NEGATIVE
Spec Grav, UA: 1.02
Urobilinogen, UA: 0.2

## 2012-08-02 MED ORDER — OMEPRAZOLE 20 MG PO CPDR: 20.0000 mg | DELAYED_RELEASE_CAPSULE | Freq: Every day | ORAL | Status: DC

## 2012-08-02 NOTE — Patient Instructions (Addendum)
The abdominal and side pain may be from gastritis (acid in the stomach) Start omeprazole (generic prilosec) 1 pill daily in the am (though you can pick it up and take first dose now)  Avoid spicy foods Avoid over the counter pain medicines  Also you can hold heat over your ribs for back stiffness If you are not starting to improve in 1 week - please let me know so we can check some labs and maybe some xrays

## 2012-08-02 NOTE — Assessment & Plan Note (Signed)
With mild tenderness in anterolat L ribs on exam - per pt this has been intermittent since injury as a child ? If assoc with abd pain on that same side  Reassuring exam Recommended some stretches/ deep breathing and heat  If no imp consider cxr/ rib films  Also poss of radicular pain - she is already on narcotic pain med

## 2012-08-02 NOTE — Progress Notes (Signed)
Subjective:    Patient ID: Alexa Woods, female    DOB: 07-Jan-1961, 52 y.o.   MRN: [id number]  HPI Here with pain in her L upper "side"- flank area  For a week  No trauma and no new exercise or activity- this has been on and off for years   (had contusion as a child and no rib fracture) Feels unstable - like it wants to "roll under" Dull throbbing ache  Worse when she eats (heartburn is not bad lately) Also worse to twist/ lie on that side/ and get up out of bed --helps to push on it  No cough or fever lately (has had chills but no fever) occ hurts to take a deep breath -not often   Urinary symptoms - some burning to urinate and some odor in urine and urgency  She tends to keep these symptoms all the time  ua is clear today, however   No nsaids  Takes percocet for chronic back pain  Stays constipated from this and stool softeners do not take care of it    Patient Active Problem List  Diagnosis  . DIABETES MELLITUS, TYPE II  . HYPOKALEMIA  . MORBID OBESITY  . ANXIETY  . History of alcohol abuse  . DEPRESSION  . HYPERTENSION  . HEMORRHOIDS  . ALLERGIC RHINITIS  . ASTHMA  . GERD  . CYSTITIS, CHRONIC  . PSORIASIS  . PATELLO-FEMORAL SYNDROME  . BACK PAIN  . DYSPHAGIA UNSPECIFIED  . Thrombocytopenia  . Lung density on x-ray  . Vertigo  . 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 Rash of hands  . Asthma with acute exacerbation  . Trapezius muscle spasm   Past Medical History  Diagnosis Date  . Epigastric abdominal pain   . Alcohol abuse, unspecified   . Allergic rhinitis, cause unspecified   . Anxiety state, unspecified   . Unspecified asthma   . Backache, unspecified   . Other chronic cystitis   . Depressive disorder, not elsewhere classified   . Type II or unspecified type diabetes mellitus without mention of complication, not stated as uncontrolled   . Dysphagia, unspecified   . Esophageal reflux   . Unspecified hemorrhoids without mention of complication    . Herpes zoster without mention of complication   . Unspecified essential hypertension   . Hypopotassemia   . Morbid obesity   . Other screening mammogram   . Pain in joint, lower leg   . Other psoriasis   . History of Bell's palsy 4/09  . Wrist fracture   . Edema   . Gastroparesis   . Myocardial infarction    Past Surgical History  Procedure Laterality Date  . Appendectomy    . Cholecystectomy    . Partial hysterectomy    . Knee arthroscopy w/ partial medial meniscectomy  10/2008    and chondroplasty  . Total knee arthroplasty    . Esophagogastroduodenoscopy  07/2002    erythematous gastropathy  . Shoulder surgery  05/2010  . Dobutamine stress echo  2009     normal stress echo, no evidence of ischemia   History  Substance Use Topics  . Smoking status: Former Smoker    Quit date: 12/30/2001  . Smokeless tobacco: Never Used  . Alcohol Use: No     Comment: former alcoholic    Family History  Problem Relation Age of Onset  . Alcohol abuse Mother   . Hypertension Mother   . Esophageal cancer Maternal Grandmother  Allergies  Allergen Reactions  . Bupropion Hcl   . Cefuroxime Axetil Nausea Only  . Codeine     REACTION: nausea and vomiting, rash  . Lidocaine     REACTION: unknown  . Metformin     REACTION: GI  . Omeprazole     REACTION: does not work for her  . Paroxetine     REACTION: doesn't agree  . Propoxyphene-Acetaminophen     REACTION: wheezing  . Sulfonamide Derivatives     REACTION: rash  . Tramadol Hcl     REACTION: Causes Anxiety   Current Outpatient Prescriptions on File Prior to Visit  Medication Sig Dispense Refill  . albuterol (VENTOLIN HFA) 108 (90 BASE) MCG/ACT inhaler Inhale 2 puffs into the lungs every 4 (four) hours as needed.  1 Inhaler  3  . docusate sodium (COLACE) 50 MG capsule Take by mouth daily.      . fluticasone (FLONASE) 50 MCG/ACT nasal spray Place 2 sprays into the nose daily as needed.        . Fluticasone-Salmeterol  (ADVAIR DISKUS) 250-50 MCG/DOSE AEPB Inhale 1 puff into the lungs 2 (two) times daily.  60 each  0  . glucose blood (ONE TOUCH ULTRA TEST) test strip Use as directed daily  100 each  6  . hydrochlorothiazide (HYDRODIURIL) 25 MG tablet Take 1 tablet (25 mg total) by mouth daily.  30 tablet  11  . HYDROcodone-homatropine (HYCODAN) 5-1.5 MG/5ML syrup Take 5 mLs by mouth every 8 (eight) hours as needed for cough.  180 mL  0  . insulin aspart (NOVOLOG FLEXPEN) 100 UNIT/ML injection Sliding scale as directed  1 pen  0  . insulin glargine (LANTUS SOLOSTAR) 100 UNIT/ML injection Inject 60 Units into the skin at bedtime.  10 mL  3  . Insulin Pen Needle (EASY TOUCH PEN NEEDLES) 31G X 8 MM MISC Use as directed twice daily  100 each  2  . Lancets (ONETOUCH ULTRASOFT) lancets Use as directed daily.  100 each  6  . LORazepam (ATIVAN) 1 MG tablet Take 1 mg by mouth as needed.        Marland Kitchen olopatadine (PATANOL) 0.1 % ophthalmic solution Place 2 drops into both eyes 3 (three) times daily as needed.        Marland Kitchen oxyCODONE-acetaminophen (PERCOCET) 10-325 MG per tablet Take 1 tablet by mouth every 6 (six) hours as needed. For pain       . potassium chloride (K-DUR,KLOR-CON) 10 MEQ tablet TAKE ONE (1) TABLET BY MOUTH EVERY DAY  90 tablet  0  . sertraline (ZOLOFT) 100 MG tablet Take 100 mg by mouth 2 (two) times daily.        Marland Kitchen tiZANidine (ZANAFLEX) 4 MG tablet Take 4 mg by mouth every 8 (eight) hours as needed. For muscle spasm       . triamcinolone cream (KENALOG) 0.5 % Apply topically 2 (two) times daily. Apply to affected areas  30 g  1  . zolpidem (AMBIEN) 10 MG tablet Take 15 mg by mouth at bedtime as needed.       . [DISCONTINUED] potassium chloride (KLOR-CON) 10 MEQ CR tablet Take 1 tablet (10 mEq total) by mouth daily.  60 tablet  6   No current facility-administered medications on file prior to visit.     Review of Systems Review of Systems  Constitutional: Negative for fever, appetite change, fatigue and  unexpected weight change.  Eyes: Negative for pain and visual disturbance.  Respiratory: Negative  for cough and shortness of breath.   Cardiovascular: Negative for cp or palpitations   pos for rib/chest wall soreness  Gastrointestinal: Negative for nausea, diarrhea and blood in stool/ dark stool/ pos for abd pain  Genitourinary: Negative for urgency and frequency.  Skin: Negative for pallor or rash   MSK pos for chronic back pain and neg for joint swelling  Neurological: Negative for weakness, light-headedness, numbness and headaches.  Hematological: Negative for adenopathy. Does not bruise/bleed easily.  Psychiatric/Behavioral: pos for depression and anx.         Objective:   Physical Exam  Constitutional: She appears well-developed and well-nourished. No distress.  obese and well appearing   HENT:  Head: Normocephalic and atraumatic.  Eyes: Conjunctivae and EOM are normal. Pupils are equal, round, and reactive to light. Right eye exhibits no discharge. Left eye exhibits no discharge. No scleral icterus.  Neck: Normal range of motion. Neck supple. No thyromegaly present.  Cardiovascular: Normal rate, regular rhythm, normal heart sounds and intact distal pulses.  Exam reveals no gallop.   Pulmonary/Chest: Effort normal and breath sounds normal. No respiratory distress. She has no wheezes. She exhibits tenderness.  Harsh bs   Tender over L lower ribs anterolaterally - mild without crepitus or skin change   Abdominal: Soft. Bowel sounds are normal. She exhibits no distension, no pulsatile liver, no abdominal bruit, no ascites and no mass. There is no hepatosplenomegaly. There is tenderness in the epigastric area and left upper quadrant. There is no rigidity, no rebound, no guarding, no CVA tenderness and negative Murphy's sign.  Musculoskeletal: She exhibits tenderness. She exhibits no edema.  Lymphadenopathy:    She has no cervical adenopathy.  Neurological: She is alert. She has normal  reflexes.  Skin: Skin is warm and dry. No rash noted. No erythema. No pallor.  Psychiatric: She is slowed. Cognition and memory are normal. She exhibits a depressed mood.  Seems generally fatigued           Assessment & Plan:

## 2012-08-02 NOTE — Assessment & Plan Note (Signed)
Pt has epigastric and LUQ pain /burning with a hx of GERD and GI problems Will try PPI- which she has not had in a while  Omeprazole 20 Also disc diet and need for wt loss If no imp in 1 week will check labs for abd pain

## 2012-08-03 ENCOUNTER — Other Ambulatory Visit: Payer: Self-pay

## 2012-08-03 MED ORDER — INSULIN PEN NEEDLE 31G X 8 MM MISC: Status: DC

## 2012-08-22 ENCOUNTER — Telehealth: Payer: Self-pay | Admitting: Family Medicine

## 2012-08-22 ENCOUNTER — Other Ambulatory Visit: Payer: PRIVATE HEALTH INSURANCE

## 2012-08-22 DIAGNOSIS — E876 Hypokalemia: Secondary | ICD-10-CM

## 2012-08-22 DIAGNOSIS — I10 Essential (primary) hypertension: Secondary | ICD-10-CM

## 2012-08-22 DIAGNOSIS — E119 Type 2 diabetes mellitus without complications: Secondary | ICD-10-CM

## 2012-08-22 DIAGNOSIS — D696 Thrombocytopenia, unspecified: Secondary | ICD-10-CM

## 2012-08-22 NOTE — Telephone Encounter (Signed)
Message copied by Judy Pimple on Mon Aug 22, 2012  8:06 AM ------      Message from: Baldomero Lamy      Created: Fri Aug 12, 2012 10:22 AM      Regarding: F/u labs scheduled 08/22/12 (Mon)       Please order  future f/u labs for pt's upcomming lab appt.      Thanks      Tasha             ------

## 2012-08-29 ENCOUNTER — Ambulatory Visit: Payer: PRIVATE HEALTH INSURANCE | Admitting: Family Medicine

## 2012-08-29 DIAGNOSIS — Z0289 Encounter for other administrative examinations: Secondary | ICD-10-CM

## 2012-09-08 ENCOUNTER — Other Ambulatory Visit: Payer: Self-pay | Admitting: Family Medicine

## 2012-09-09 NOTE — Telephone Encounter (Signed)
Pt no showed her last lab and f/u appt ok to refill?

## 2012-09-09 NOTE — Telephone Encounter (Signed)
Please re schedule appt and fill until then, thanks

## 2012-09-09 NOTE — Telephone Encounter (Signed)
appt scheduled and meds refilled 

## 2012-09-23 ENCOUNTER — Other Ambulatory Visit (INDEPENDENT_AMBULATORY_CARE_PROVIDER_SITE_OTHER): Payer: PRIVATE HEALTH INSURANCE

## 2012-09-23 DIAGNOSIS — D696 Thrombocytopenia, unspecified: Secondary | ICD-10-CM

## 2012-09-23 DIAGNOSIS — I10 Essential (primary) hypertension: Secondary | ICD-10-CM

## 2012-09-23 DIAGNOSIS — E876 Hypokalemia: Secondary | ICD-10-CM

## 2012-09-23 DIAGNOSIS — E119 Type 2 diabetes mellitus without complications: Secondary | ICD-10-CM

## 2012-09-23 LAB — CBC WITH DIFFERENTIAL/PLATELET
Basophils Relative: 0.5 % (ref 0.0–3.0)
Eosinophils Absolute: 0 10*3/uL (ref 0.0–0.7)
Eosinophils Relative: 0.6 % (ref 0.0–5.0)
Lymphocytes Relative: 34.3 % (ref 12.0–46.0)
Monocytes Relative: 6.8 % (ref 3.0–12.0)
Neutrophils Relative %: 57.8 % (ref 43.0–77.0)
RBC: 4.77 Mil/uL (ref 3.87–5.11)
WBC: 8.9 10*3/uL (ref 4.5–10.5)

## 2012-09-23 LAB — COMPREHENSIVE METABOLIC PANEL
AST: 19 U/L (ref 0–37)
Albumin: 3.9 g/dL (ref 3.5–5.2)
BUN: 18 mg/dL (ref 6–23)
CO2: 32 mEq/L (ref 19–32)
Calcium: 9.1 mg/dL (ref 8.4–10.5)
Chloride: 103 mEq/L (ref 96–112)
Potassium: 4.1 mEq/L (ref 3.5–5.1)

## 2012-09-23 LAB — LDL CHOLESTEROL, DIRECT: Direct LDL: 126.8 mg/dL

## 2012-09-23 LAB — LIPID PANEL
Cholesterol: 214 mg/dL — ABNORMAL HIGH (ref 0–200)
Total CHOL/HDL Ratio: 3

## 2012-09-27 ENCOUNTER — Ambulatory Visit (INDEPENDENT_AMBULATORY_CARE_PROVIDER_SITE_OTHER): Payer: PRIVATE HEALTH INSURANCE | Admitting: Family Medicine

## 2012-09-27 ENCOUNTER — Encounter: Payer: Self-pay | Admitting: Family Medicine

## 2012-09-27 VITALS — BP 108/78 | HR 82 | Temp 98.0°F | Ht 66.0 in | Wt 258.0 lb

## 2012-09-27 DIAGNOSIS — E785 Hyperlipidemia, unspecified: Secondary | ICD-10-CM | POA: Insufficient documentation

## 2012-09-27 DIAGNOSIS — K2970 Gastritis, unspecified, without bleeding: Secondary | ICD-10-CM

## 2012-09-27 DIAGNOSIS — E119 Type 2 diabetes mellitus without complications: Secondary | ICD-10-CM

## 2012-09-27 DIAGNOSIS — I10 Essential (primary) hypertension: Secondary | ICD-10-CM

## 2012-09-27 MED ORDER — HYDROCHLOROTHIAZIDE 25 MG PO TABS: 25.0000 mg | ORAL_TABLET | Freq: Every day | ORAL | Status: DC

## 2012-09-27 NOTE — Assessment & Plan Note (Signed)
bp in fair control at this time  No changes needed  Disc lifstyle change with low sodium diet and exercise  Will add back hctz for edema

## 2012-09-27 NOTE — Patient Instructions (Addendum)
Call around to some [facility] to see about getting less expensive work done  I will send your fluid pill in to the pharmacy  Keep watching diet and keep exercising as much as you can  Schedule appt with dr Wyonia Hough here in 3 months (see Shirlee Limerick for referral at check out) - schedule fasting labs prior  You can try increasing your omeprazole to one pill twice daily to see if that helps stomach pain

## 2012-09-27 NOTE — Assessment & Plan Note (Signed)
With LDL in 120s and good HDL Disc goals for lipids and reasons to control them- goal of LDL under 100 Rev labs with pt Pt apprehensive to start statin at this time and cannot afford more medicine Rev low sat fat diet in detail

## 2012-09-27 NOTE — Assessment & Plan Note (Signed)
Some improvement in A1c with diet- pt did not tolerate inc in lantus due to hypoglycemia Commended diet and exercise efforts  Will continue this and re check a1c in 3 mo and then arrange f/u with endocrinologist Dr Wyonia Hough here  She is agreeable to this

## 2012-09-27 NOTE — Assessment & Plan Note (Signed)
Ongoing pain in epigastric area (no n/v) -no imp with omeprazole  Disc poss of gastroparesis and need to eat smaller portions  Will inc omeprazole to bid to see if this helps and if not -consider return to GI
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
## 2012-10-10 ENCOUNTER — Other Ambulatory Visit: Payer: Self-pay | Admitting: Family Medicine

## 2012-12-28 ENCOUNTER — Ambulatory Visit: Payer: PRIVATE HEALTH INSURANCE | Admitting: Internal Medicine

## 2013-01-10 ENCOUNTER — Other Ambulatory Visit: Payer: Self-pay | Admitting: Family Medicine

## 2013-02-03 ENCOUNTER — Encounter: Payer: Self-pay | Admitting: Family Medicine

## 2013-02-03 ENCOUNTER — Ambulatory Visit (INDEPENDENT_AMBULATORY_CARE_PROVIDER_SITE_OTHER): Payer: Managed Care, Other (non HMO) | Admitting: Family Medicine

## 2013-02-03 VITALS — BP 118/78 | HR 78 | Temp 98.4°F | Ht 66.0 in | Wt 256.5 lb

## 2013-02-03 DIAGNOSIS — S30860A Insect bite (nonvenomous) of lower back and pelvis, initial encounter: Secondary | ICD-10-CM | POA: Insufficient documentation

## 2013-02-03 DIAGNOSIS — K056 Periodontal disease, unspecified: Secondary | ICD-10-CM

## 2013-02-03 MED ORDER — CHLORHEXIDINE GLUCONATE 0.12 % MT SOLN: 15.0000 mL | Freq: Two times a day (BID) | OROMUCOSAL | Status: DC

## 2013-02-03 MED ORDER — DOXYCYCLINE HYCLATE 100 MG PO TABS: 100.0000 mg | ORAL_TABLET | Freq: Two times a day (BID) | ORAL | Status: DC

## 2013-02-03 NOTE — Progress Notes (Signed)
Subjective:    Patient ID: Alexa Woods, female    DOB: 01-09-1961, 52 y.o.   MRN: [id number]  HPI Here with a tick bite and ? Mouth infection  Noticed tick bite 2 d ago  On her back  Her son pulled it off with tweezers and cleaned it with alcohol Not a tiny tick  ? How long it had been there  Has a lot of ticks in the yard and a dog in the house   Gums are inflammed-has gum disease  A tooth broke off L front-it is loose also -it scratches her mouth   She has dental insurance - but it does not cover much   Patient Active Problem List   Diagnosis Date Noted  . Hyperlipidemia, mild 09/27/2012  . Unspecified gastritis and gastroduodenitis without mention of hemorrhage 08/02/2012  . Rash of hands 05/23/2012  . Asthma with acute exacerbation 05/23/2012  . 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 Lung density on x-ray 02/23/2011  . Thrombocytopenia 02/20/2011  . DYSPHAGIA UNSPECIFIED 08/07/2010  . BACK PAIN 07/22/2010  . MORBID OBESITY 03/30/2008  . 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  . DIABETES MELLITUS, TYPE II 11/30/2006  . ANXIETY 11/30/2006  . History of alcohol abuse 11/30/2006  . DEPRESSION 11/30/2006  . HYPERTENSION 11/30/2006  . HEMORRHOIDS 11/30/2006  . ALLERGIC RHINITIS 11/30/2006  . ASTHMA 11/30/2006  . GERD 11/30/2006  . CYSTITIS, CHRONIC 11/30/2006  . PSORIASIS 11/30/2006   Past Medical History  Diagnosis Date  . Epigastric abdominal pain   . Alcohol abuse, unspecified   . Allergic rhinitis, cause unspecified   . Anxiety state, unspecified   . Unspecified asthma(493.90)   . Backache, unspecified   . Other chronic cystitis   . Depressive disorder, not elsewhere classified   . Type II or unspecified type diabetes mellitus without mention of complication, not stated as uncontrolled   . Dysphagia, unspecified(787.20)   . Esophageal reflux   . Unspecified hemorrhoids without mention of complication   . Herpes zoster without mention of  complication   . Unspecified essential hypertension   . Hypopotassemia   . Morbid obesity   . Other screening mammogram   . Pain in joint, lower leg   . Other psoriasis   . History of Bell's palsy 4/09  . Wrist fracture   . Edema   . Gastroparesis   . Myocardial infarction    Past Surgical History  Procedure Laterality Date  . Appendectomy    . Cholecystectomy    . Partial hysterectomy    . Knee arthroscopy w/ partial medial meniscectomy  10/2008    and chondroplasty  . Total knee arthroplasty    . Esophagogastroduodenoscopy  07/2002    erythematous gastropathy  . Shoulder surgery  05/2010  . Dobutamine stress echo  2009     normal stress echo, no evidence of ischemia   History  Substance Use Topics  . Smoking status: Former Smoker    Quit date: 12/30/2001  . Smokeless tobacco: Never Used  . Alcohol Use: No     Comment: former alcoholic    Family History  Problem Relation Age of Onset  . Alcohol abuse Mother   . Hypertension Mother   . Esophageal cancer Maternal Grandmother    Allergies  Allergen Reactions  . Bupropion Hcl   . Cefuroxime Axetil Nausea Only  . Codeine     REACTION: nausea and vomiting, rash  . Lidocaine     REACTION: unknown  . Metformin  REACTION: GI  . Paroxetine     REACTION: doesn't agree  . Propoxyphene-Acetaminophen     REACTION: wheezing  . Sulfonamide Derivatives     REACTION: rash  . Tramadol Hcl     REACTION: Causes Anxiety   Current Outpatient Prescriptions on File Prior to Visit  Medication Sig Dispense Refill  . albuterol (VENTOLIN HFA) 108 (90 BASE) MCG/ACT inhaler Inhale 2 puffs into the lungs every 4 (four) hours as needed.  1 Inhaler  3  . fluticasone (FLONASE) 50 MCG/ACT nasal spray Place 2 sprays into the nose daily as needed.        . Fluticasone-Salmeterol (ADVAIR DISKUS) 250-50 MCG/DOSE AEPB Inhale 1 puff into the lungs 2 (two) times daily.  60 each  0  . glucose blood (ONE TOUCH ULTRA TEST) test strip Use as  directed daily  100 each  6  . hydrochlorothiazide (HYDRODIURIL) 25 MG tablet Take 1 tablet (25 mg total) by mouth daily.  30 tablet  11  . insulin aspart (NOVOLOG FLEXPEN) 100 UNIT/ML injection Sliding scale as directed  1 pen  0  . insulin glargine (LANTUS) 100 UNIT/ML injection Inject 50 Units into the skin at bedtime.      . Insulin Pen Needle (EASY TOUCH PEN NEEDLES) 31G X 8 MM MISC Use as directed twice daily  100 each  2  . Lancets (ONETOUCH ULTRASOFT) lancets Use as directed daily.  100 each  6  . LORazepam (ATIVAN) 1 MG tablet Take 1 mg by mouth as needed.        Marland Kitchen olopatadine (PATANOL) 0.1 % ophthalmic solution Place 2 drops into both eyes 3 (three) times daily as needed.        Marland Kitchen omeprazole (PRILOSEC) 20 MG capsule Take 1 capsule (20 mg total) by mouth daily.  30 capsule  11  . oxyCODONE-acetaminophen (PERCOCET) 10-325 MG per tablet Take 1 tablet by mouth every 6 (six) hours as needed. For pain       . potassium chloride (K-DUR,KLOR-CON) 10 MEQ tablet TAKE ONE (1) TABLET BY MOUTH EACH DAY  30 tablet  0  . sertraline (ZOLOFT) 100 MG tablet Take 100 mg by mouth 2 (two) times daily.        Marland Kitchen tiZANidine (ZANAFLEX) 4 MG tablet Take 4 mg by mouth every 8 (eight) hours as needed. For muscle spasm       . triamcinolone cream (KENALOG) 0.5 % Apply topically 2 (two) times daily. Apply to affected areas  30 g  1  . zolpidem (AMBIEN) 10 MG tablet Take 15 mg by mouth at bedtime as needed.       . [DISCONTINUED] potassium chloride (KLOR-CON) 10 MEQ CR tablet Take 1 tablet (10 mEq total) by mouth daily.  60 tablet  6   No current facility-administered medications on file prior to visit.      Review of Systems Review of Systems  Constitutional: Negative for fever, appetite change,  and unexpected weight change. pos for chills ENT pos for gums bleeding/ swollen / broken tooth and mouth pain / neg for facial swelling  Eyes: Negative for pain and visual disturbance.  Respiratory: Negative for cough  and shortness of breath.   Cardiovascular: Negative for cp or palpitations    Gastrointestinal: Negative for nausea, diarrhea and constipation.  Genitourinary: Negative for urgency and frequency.  Skin: Negative for pallor or rash  pos for tick bite with redness  Neurological: Negative for weakness, light-headedness, numbness and headaches.  Hematological: Negative  for adenopathy. Does not bruise/bleed easily.  Psychiatric/Behavioral: Negative for dysphoric mood. The patient is not nervous/anxious.         Objective:   Physical Exam  Constitutional: She appears well-developed and well-nourished. No distress.  obese and well appearing   HENT:  Head: Normocephalic and atraumatic.  Mouth/Throat: Oropharynx is clear and moist.  Very poor dentition Gingivitis and receeding gums Broken tooth L incisor  No s/s of abcess No facial swelling   Eyes: Conjunctivae and EOM are normal. Pupils are equal, round, and reactive to light. Right eye exhibits no discharge. Left eye exhibits no discharge. No scleral icterus.  Neck: Normal range of motion. Neck supple. No thyromegaly present.  Cardiovascular: Normal rate and regular rhythm.   Musculoskeletal: She exhibits no edema.  Lymphadenopathy:    She has no cervical adenopathy.  Neurological: She is alert. She has normal reflexes.  Skin: Skin is warm and dry. No rash noted. No pallor.  Tick bite with oval area of erythema several cm in L flank/ back area No rash   Psychiatric: She has a normal mood and affect.          Assessment & Plan:

## 2013-02-03 NOTE — Patient Instructions (Addendum)
Get in with a dentist when you can  Swish the peridex mouthwash twice daily  For tick bite- take the doxycycline as directed  You can use cort aid cream on the area of tick bite for itching

## 2013-02-05 NOTE — Assessment & Plan Note (Signed)
No overt signs of infection - but broken tooth may become infected soon Aware of relationship between oral dz and heart dz - may need to have teeth pulled when she can afford it  Px peridex for now and inst to follow up with a dentist asap

## 2013-02-05 NOTE — Assessment & Plan Note (Signed)
Initial encounter No residual tick  In light of continued symptoms and some joint pain -will cover empirically with doxycycline  Update if not starting to improve in a week or if worsening

## 2013-02-07 ENCOUNTER — Telehealth: Payer: Self-pay

## 2013-02-07 NOTE — Telephone Encounter (Signed)
If she begins to have trouble with speech or incoordination or confusion/severe headache or other symptoms please go to ER after hours Otherwise schedule a f/u with first avail provider

## 2013-02-07 NOTE — Telephone Encounter (Signed)
Patient advised.  Appt was scheduled in late afternoon with Dr. Milinda Antis because she does not have transportation until then.  She will go to ER if symptoms escalate.

## 2013-02-07 NOTE — Telephone Encounter (Signed)
Pt left v/m seen 02/03/13 with tick bite; today pt has developed numbness on right side of face. Left v/m for pt to cb.

## 2013-02-07 NOTE — Telephone Encounter (Signed)
Pt called and developed numbness 2 days ago on rt side of face, rt side of nose and eye feel numb also; rt eye appears to be sagging. Pt has hx of bells palsy. Pt does not have pain in rt side of face. Vision in rt eye is OK. No h/a now. Had pain rt side of head earlier like a shooting pain. No pain now. Please advise.

## 2013-02-08 ENCOUNTER — Encounter: Payer: Self-pay | Admitting: Family Medicine

## 2013-02-08 ENCOUNTER — Ambulatory Visit (INDEPENDENT_AMBULATORY_CARE_PROVIDER_SITE_OTHER): Payer: Managed Care, Other (non HMO) | Admitting: Family Medicine

## 2013-02-08 VITALS — BP 108/68 | HR 80 | Temp 98.6°F | Ht 66.0 in | Wt 256.5 lb

## 2013-02-08 DIAGNOSIS — R21 Rash and other nonspecific skin eruption: Secondary | ICD-10-CM | POA: Insufficient documentation

## 2013-02-08 DIAGNOSIS — R202 Paresthesia of skin: Secondary | ICD-10-CM | POA: Insufficient documentation

## 2013-02-08 DIAGNOSIS — R209 Unspecified disturbances of skin sensation: Secondary | ICD-10-CM

## 2013-02-08 MED ORDER — VALACYCLOVIR HCL 1 G PO TABS: 1000.0000 mg | ORAL_TABLET | Freq: Two times a day (BID) | ORAL | Status: DC

## 2013-02-08 NOTE — Progress Notes (Signed)
Subjective:    Patient ID: Alexa Woods, female    DOB: [birth year], 52 y.o.   MRN: [id number]  HPI Pt here with ? Bells palsy symptoms  Has had a little bit of numbness of R side of face at last visit-this has escalated  No facial droop  Does have a skin spot on her temple -- had clear liquid drainage from it - she picked it  Is sore    When she had Bell's palsy on the past - was L side (2009)  Tick bite is better   A little wheezing at night  Has had cold symptoms/ congestion/ sneezing/ no st  No ear pain Eyes are irritated   Patient Active Problem List   Diagnosis Date Noted  . Tick bite of back 02/03/2013  . Periodontal disease 02/03/2013  . Hyperlipidemia, mild 09/27/2012  . Unspecified gastritis and gastroduodenitis without mention of hemorrhage 08/02/2012  . Rash of hands 05/23/2012  . Asthma with acute exacerbation 05/23/2012  . 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 Lung density on x-ray 02/23/2011  . Thrombocytopenia 02/20/2011  . DYSPHAGIA UNSPECIFIED 08/07/2010  . BACK PAIN 07/22/2010  . MORBID OBESITY 03/30/2008  . 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  . DIABETES MELLITUS, TYPE II 11/30/2006  . ANXIETY 11/30/2006  . History of alcohol abuse 11/30/2006  . DEPRESSION 11/30/2006  . HYPERTENSION 11/30/2006  . HEMORRHOIDS 11/30/2006  . ALLERGIC RHINITIS 11/30/2006  . ASTHMA 11/30/2006  . GERD 11/30/2006  . CYSTITIS, CHRONIC 11/30/2006  . PSORIASIS 11/30/2006   Past Medical History  Diagnosis Date  . Epigastric abdominal pain   . Alcohol abuse, unspecified   . Allergic rhinitis, cause unspecified   . Anxiety state, unspecified   . Unspecified asthma(493.90)   . Backache, unspecified   . Other chronic cystitis   . Depressive disorder, not elsewhere classified   . Type II or unspecified type diabetes mellitus without mention of complication, not stated as uncontrolled   . Dysphagia, unspecified(787.20)   . Esophageal reflux   . Unspecified  hemorrhoids without mention of complication   . Herpes zoster without mention of complication   . Unspecified essential hypertension   . Hypopotassemia   . Morbid obesity   . Other screening mammogram   . Pain in joint, lower leg   . Other psoriasis   . History of Bell's palsy 4/09  . Wrist fracture   . Edema   . Gastroparesis   . Myocardial infarction    Past Surgical History  Procedure Laterality Date  . Appendectomy    . Cholecystectomy    . Partial hysterectomy    . Knee arthroscopy w/ partial medial meniscectomy  10/2008    and chondroplasty  . Total knee arthroplasty    . Esophagogastroduodenoscopy  07/2002    erythematous gastropathy  . Shoulder surgery  05/2010  . Dobutamine stress echo  2009     normal stress echo, no evidence of ischemia   History  Substance Use Topics  . Smoking status: Former Smoker    Quit date: 12/30/2001  . Smokeless tobacco: Never Used  . Alcohol Use: No     Comment: former alcoholic    Family History  Problem Relation Age of Onset  . Alcohol abuse Mother   . Hypertension Mother   . Esophageal cancer Maternal Grandmother    Allergies  Allergen Reactions  . Bupropion Hcl   . Cefuroxime Axetil Nausea Only  . Codeine     REACTION: nausea and  vomiting, rash  . Lidocaine     REACTION: unknown  . Metformin     REACTION: GI  . Paroxetine     REACTION: doesn't agree  . Propoxyphene-Acetaminophen     REACTION: wheezing  . Sulfonamide Derivatives     REACTION: rash  . Tramadol Hcl     REACTION: Causes Anxiety   Current Outpatient Prescriptions on File Prior to Visit  Medication Sig Dispense Refill  . albuterol (VENTOLIN HFA) 108 (90 BASE) MCG/ACT inhaler Inhale 2 puffs into the lungs every 4 (four) hours as needed.  1 Inhaler  3  . chlorhexidine (PERIDEX) 0.12 % solution Use as directed 15 mLs in the mouth or throat 2 (two) times daily. Swish and spit  120 mL  0  . doxycycline (VIBRA-TABS) 100 MG tablet Take 1 tablet (100 mg total)  by mouth 2 (two) times daily. Take with non dairy food  20 tablet  0  . fluticasone (FLONASE) 50 MCG/ACT nasal spray Place 2 sprays into the nose daily as needed.        . Fluticasone-Salmeterol (ADVAIR DISKUS) 250-50 MCG/DOSE AEPB Inhale 1 puff into the lungs 2 (two) times daily.  60 each  0  . glucose blood (ONE TOUCH ULTRA TEST) test strip Use as directed daily  100 each  6  . hydrochlorothiazide (HYDRODIURIL) 25 MG tablet Take 1 tablet (25 mg total) by mouth daily.  30 tablet  11  . insulin aspart (NOVOLOG FLEXPEN) 100 UNIT/ML injection Sliding scale as directed  1 pen  0  . insulin glargine (LANTUS) 100 UNIT/ML injection Inject 50 Units into the skin at bedtime.      . Insulin Pen Needle (EASY TOUCH PEN NEEDLES) 31G X 8 MM MISC Use as directed twice daily  100 each  2  . Lancets (ONETOUCH ULTRASOFT) lancets Use as directed daily.  100 each  6  . LORazepam (ATIVAN) 1 MG tablet Take 1 mg by mouth as needed.        Marland Kitchen olopatadine (PATANOL) 0.1 % ophthalmic solution Place 2 drops into both eyes 3 (three) times daily as needed.        Marland Kitchen omeprazole (PRILOSEC) 20 MG capsule Take 1 capsule (20 mg total) by mouth daily.  30 capsule  11  . oxyCODONE-acetaminophen (PERCOCET) 10-325 MG per tablet Take 1 tablet by mouth every 6 (six) hours as needed. For pain       . potassium chloride (K-DUR,KLOR-CON) 10 MEQ tablet TAKE ONE (1) TABLET BY MOUTH EACH DAY  30 tablet  0  . sertraline (ZOLOFT) 100 MG tablet Take 100 mg by mouth 2 (two) times daily.        Marland Kitchen tiZANidine (ZANAFLEX) 4 MG tablet Take 4 mg by mouth every 8 (eight) hours as needed. For muscle spasm       . triamcinolone cream (KENALOG) 0.5 % Apply topically 2 (two) times daily. Apply to affected areas  30 g  1  . zolpidem (AMBIEN) 10 MG tablet Take 15 mg by mouth at bedtime as needed.       . [DISCONTINUED] potassium chloride (KLOR-CON) 10 MEQ CR tablet Take 1 tablet (10 mEq total) by mouth daily.  60 tablet  6   No current facility-administered  medications on file prior to visit.      Review of Systems Review of Systems  Constitutional: Negative for fever, appetite change, fatigue and unexpected weight change. ENT pos for rhinorrhea and sneezing/ neg for sinus pain  Eyes: Negative for pain and visual disturbance.  Respiratory: Negative for cough and shortness of breath.  pos for occ wheeze  Cardiovascular: Negative for cp or palpitations    Gastrointestinal: Negative for nausea, diarrhea and constipation.  Genitourinary: Negative for urgency and frequency.  Skin: Negative for pallor and pos for small area of rash on face   Neurological: Negative for weakness, light-headedness,  and pos for some scalp pain on R Hematological: Negative for adenopathy. Does not bruise/bleed easily.  Psychiatric/Behavioral: Negative for dysphoric mood. The patient is not nervous/anxious.         Objective:   Physical Exam  Constitutional: She appears well-developed and well-nourished.  obese and well appearing   HENT:  Head: Normocephalic and atraumatic.  Right Ear: External ear normal.  Left Ear: External ear normal.  Mouth/Throat: Oropharynx is clear and moist.  Nares are boggy No facial droop or weakness 2 cm oval area of scabbed vesicles on R temple - mildly tender  Eyes: Conjunctivae and EOM are normal. Pupils are equal, round, and reactive to light. Right eye exhibits no discharge. Left eye exhibits no discharge. No scleral icterus.  Neck: Normal range of motion. Neck supple. No JVD present. Carotid bruit is not present. No thyromegaly present.  Cardiovascular: Normal rate and regular rhythm.   Pulmonary/Chest: Effort normal and breath sounds normal. No respiratory distress. She has no wheezes. She has no rales.  Musculoskeletal: She exhibits no edema and no tenderness.  Lymphadenopathy:    She has no cervical adenopathy.  Neurological: She is alert. She has normal strength and normal reflexes. She displays no atrophy and no  tremor. A sensory deficit is present. She exhibits normal muscle tone. Coordination and gait normal.  Dec sens to cold temp and soft touch on R face only   Skin: Skin is warm and dry. Rash noted.  Psychiatric: She has a normal mood and affect.          Assessment & Plan:

## 2013-02-08 NOTE — Patient Instructions (Addendum)
In light of rash on face and numbness- I'm going to treat you with shingles medicine (valtrex)-I sent it to [facility]  Keep the area clean  If symptoms worsen please let me know  Take care of yourself

## 2013-02-09 NOTE — Assessment & Plan Note (Signed)
R sided= small amt of vesicular breakout with c/o R sided facial parethesia and R sided scalp pain  canno r/o zoster-will cover with valtrex Disc symptomatic care - see instructions on AVS Update if not starting to improve in a week or if worsening

## 2013-02-16 ENCOUNTER — Other Ambulatory Visit: Payer: Self-pay | Admitting: Family Medicine

## 2013-03-02 ENCOUNTER — Other Ambulatory Visit: Payer: Self-pay | Admitting: Family Medicine

## 2013-03-08 ENCOUNTER — Telehealth: Payer: Self-pay | Admitting: Family Medicine

## 2013-03-08 ENCOUNTER — Encounter: Payer: Self-pay | Admitting: Internal Medicine

## 2013-03-08 ENCOUNTER — Ambulatory Visit (INDEPENDENT_AMBULATORY_CARE_PROVIDER_SITE_OTHER): Payer: Managed Care, Other (non HMO) | Admitting: Internal Medicine

## 2013-03-08 VITALS — BP 110/68 | HR 78 | Temp 98.9°F | Resp 10 | Ht 67.0 in | Wt 256.8 lb

## 2013-03-08 DIAGNOSIS — E119 Type 2 diabetes mellitus without complications: Secondary | ICD-10-CM

## 2013-03-08 LAB — HEMOGLOBIN A1C: Hgb A1c MFr Bld: 9.5 % — ABNORMAL HIGH (ref 4.6–6.5)

## 2013-03-08 NOTE — Telephone Encounter (Signed)
Pt requesting referral   Thanks!
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
## 2013-03-08 NOTE — Patient Instructions (Addendum)
Please decrease Lantus to 30 units and start taking it at night. Take Novolog 7 units with breakfast, lunch and dinner. Please take 9 units for a large meal. Use the following Sliding scale: - 150-175: + 1 unit  - 176-200: + 2 units  - 201-225: + 3 units  - 226-250: + 4 units  - 251-275: + 5 units - 276-300: + 6 units Skip the mealtime insulin if you skip a meal, but try not to skip meals. Please return in 1 month with your sugar log.  Please stop at the lab. Go online at [email or web address] for ideas about weight loss diet.  PATIENT INSTRUCTIONS FOR TYPE 2 DIABETES:  **Please join MyChart!** - see attached instructions about how to join   DIET AND EXERCISE Diet and exercise is an important part of diabetic treatment.  We recommended aerobic exercise in the form of brisk walking (working between 40-60% of maximal aerobic capacity, similar to brisk walking) for 150 minutes per week (such as 30 minutes five days per week) along with 3 times per week performing 'resistance' training (using various gauge rubber tubes with handles) 5-10 exercises involving the major muscle groups (upper body, lower body and core) performing 10-15 repetitions (or near fatigue) each exercise. Start at half the above goal but build slowly to reach the above goals. If limited by weight, joint pain, or disability, we recommend daily walking in a swimming pool with water up to waist to reduce pressure from joints while allow for adequate exercise.    BLOOD GLUCOSES Monitoring your blood glucoses is important for continued management of your diabetes. Please check your blood glucoses 2-4 times a day: fasting, before meals and at bedtime (you can rotate these measurements - e.g. one day check before the 3 meals, the next day check before 2 of the meals and before bedtime, etc.   HYPOGLYCEMIA (low blood sugar) Hypoglycemia is usually a reaction to not eating, exercising, or taking too much insulin/ other diabetes  drugs.  Symptoms include tremors, sweating, hunger, confusion, headache, etc. Treat IMMEDIATELY with 15 grams of Carbs:   4 glucose tablets    cup regular juice/soda   2 tablespoons raisins   4 teaspoons sugar   1 tablespoon honey Recheck blood glucose in 15 mins and repeat above if still symptomatic/blood glucose <100. Please contact our office at [phone] if you have questions about how to next handle your insulin.  RECOMMENDATIONS TO REDUCE YOUR RISK OF DIABETIC COMPLICATIONS: * Take your prescribed MEDICATION(S). * Follow a DIABETIC diet: Complex carbs, fiber rich foods, heart healthy fish twice weekly, (monounsaturated and polyunsaturated) fats * AVOID saturated/trans fats, high fat foods, >2,300 mg salt per day. * EXERCISE at least 5 times a week for 30 minutes or preferably daily.  * DO NOT SMOKE OR DRINK more than 1 drink a day. * Check your FEET every day. Do not wear tightfitting shoes. Contact us if you develop an ulcer * See your EYE doctor once a year or more if needed * Get a FLU shot once a year * Get a PNEUMONIA vaccine once before and once after age 71 years  GOALS:  * Your Hemoglobin A1c of <7%  * fasting sugars need to be <130 * after meals sugars need to be <180 (2h after you start eating) * Your Systolic BP should be 140 or lower  * Your Diastolic BP should be 80 or lower  * Your HDL (Good Cholesterol) should be 40 or higher  *  Your LDL (Bad Cholesterol) should be 100 or lower  * Your Triglycerides should be 150 or lower  * Your Urine microalbumin (kidney function) should be <30 * Your Body Mass Index should be 25 or lower   We will be glad to help you achieve these goals. Our telephone number is: [phone].

## 2013-03-09 NOTE — Telephone Encounter (Signed)
Referral to [facility] done. MK

## 2013-03-16 ENCOUNTER — Ambulatory Visit (INDEPENDENT_AMBULATORY_CARE_PROVIDER_SITE_OTHER): Payer: Managed Care, Other (non HMO) | Admitting: Family Medicine

## 2013-03-16 ENCOUNTER — Encounter: Payer: Self-pay | Admitting: Family Medicine

## 2013-03-16 VITALS — BP 118/84 | HR 68 | Temp 98.2°F | Wt 254.5 lb

## 2013-03-16 DIAGNOSIS — R109 Unspecified abdominal pain: Secondary | ICD-10-CM

## 2013-03-16 LAB — CBC WITH DIFFERENTIAL/PLATELET
Basophils Relative: 0.3 % (ref 0.0–3.0)
Eosinophils Relative: 0.4 % (ref 0.0–5.0)
HCT: 41.2 % (ref 36.0–46.0)
Hemoglobin: 14.2 g/dL (ref 12.0–15.0)
Lymphs Abs: 2 10*3/uL (ref 0.7–4.0)
MCHC: 34.5 g/dL (ref 30.0–36.0)
MCV: 80.8 fl (ref 78.0–100.0)
Monocytes Absolute: 0.5 10*3/uL (ref 0.1–1.0)
Neutro Abs: 6.8 10*3/uL (ref 1.4–7.7)
Platelets: 233 10*3/uL (ref 150.0–400.0)
RBC: 5.1 Mil/uL (ref 3.87–5.11)
WBC: 9.4 10*3/uL (ref 4.5–10.5)

## 2013-03-16 LAB — COMPREHENSIVE METABOLIC PANEL
ALT: 19 U/L (ref 0–35)
Alkaline Phosphatase: 87 U/L (ref 39–117)
BUN: 16 mg/dL (ref 6–23)
CO2: 32 mEq/L (ref 19–32)
Calcium: 9.4 mg/dL (ref 8.4–10.5)
Glucose, Bld: 144 mg/dL — ABNORMAL HIGH (ref 70–99)
Sodium: 137 mEq/L (ref 135–145)
Total Bilirubin: 0.2 mg/dL — ABNORMAL LOW (ref 0.3–1.2)
Total Protein: 8.1 g/dL (ref 6.0–8.3)

## 2013-03-16 LAB — LIPASE: Lipase: 30 U/L (ref 11.0–59.0)

## 2013-03-16 MED ORDER — ONDANSETRON HCL 4 MG PO TABS: 4.0000 mg | ORAL_TABLET | Freq: Three times a day (TID) | ORAL | Status: DC | PRN

## 2013-03-16 MED ORDER — OMEPRAZOLE 20 MG PO CPDR: 40.0000 mg | DELAYED_RELEASE_CAPSULE | Freq: Every day | ORAL | Status: DC

## 2013-03-16 NOTE — Patient Instructions (Signed)
Start higher dose omeprazole 40mg  daily. Blood work today - we will call you with results. If any fever, or worsening pain, please seek ER care. May start zofran for nausea. Pass by [facility]'s office for referral for ultrasound.

## 2013-03-16 NOTE — Assessment & Plan Note (Addendum)
?  dyspepsia/PUD vs gastroparesis related pain vs pancreatitis. Check abd [place] given marked abdominal discomfort. Stat blood work today (lipase, CMP, CBC). Treat nausea with zofran, restart higher dose PPI (omeprazole 20mg  bid) Pt states told not to use reglan

## 2013-03-16 NOTE — Progress Notes (Signed)
  Subjective:    Patient ID: Alexa Woods, female    DOB: [birth year], 52 y.o.   MRN: [id number]  HPI CC: LUQ pain  Pain under L lower ribcage, pain worse with eating.  Ongoing for last several months, worsening over the last week.  Dx with gastroparesis 2012, which is worsening as well.  Staying nauseated.  + GERD.  Some dypsnea recently and chest pain but not in the last week. No fevers/chills, diarrhea, constipation.  No new back pain.  No vomiting. Rest does not improve pain.  Not exertion related - but she doesn't exercise. Hasn't tried anything for this pain.    Has run out of prilosec over last few weeks.  H/o uncontrolled diabetes with gastroparesis and PN, sees Dr. Elvera Lennox.   Recent changes to insulin regimen, but persistently uncontrolled sugars.    Lab Results  Component Value Date   HGBA1C 9.5* 03/08/2013   Past Medical History  Diagnosis Date  . Epigastric abdominal pain   . Alcohol abuse, unspecified   . Allergic rhinitis, cause unspecified   . Anxiety state, unspecified   . Unspecified asthma(493.90)   . Backache, unspecified   . Other chronic cystitis   . Depressive disorder, not elsewhere classified   . Type II or unspecified type diabetes mellitus without mention of complication, not stated as uncontrolled   . Dysphagia, unspecified(787.20)   . Esophageal reflux   . Unspecified hemorrhoids without mention of complication   . Herpes zoster without mention of complication   . Unspecified essential hypertension   . Hypopotassemia   . Morbid obesity   . Other screening mammogram   . Pain in joint, lower leg   . Other psoriasis   . History of Bell's palsy 4/09  . Wrist fracture   . Edema   . Gastroparesis   . Myocardial infarction     Past Surgical History  Procedure Laterality Date  . Appendectomy    . Cholecystectomy    . Partial hysterectomy    . Knee arthroscopy w/ partial medial meniscectomy  10/2008    and chondroplasty  . Total knee  arthroplasty    . Esophagogastroduodenoscopy  07/2002    erythematous gastropathy  . Shoulder surgery  05/2010  . Dobutamine stress echo  2009     normal stress echo, no evidence of ischemia  . Mri  11/2102    pt states she has a bone spur on her spine   Review of Systems Per HPI    Objective:   Physical Exam  Nursing note and vitals reviewed. Constitutional: She appears well-developed and well-nourished. No distress.  HENT:  Mouth/Throat: Oropharynx is clear and moist. No oropharyngeal exudate.  Cardiovascular: Normal rate, regular rhythm, normal heart sounds and intact distal pulses.   No murmur heard. Pulmonary/Chest: Effort normal and breath sounds normal. No respiratory distress. She has no wheezes. She has no rales. She exhibits no tenderness and no bony tenderness.  No ribcage pain  Abdominal: Bowel sounds are normal. There is no hepatosplenomegaly. There is tenderness (moderate) in the right upper quadrant, epigastric area, periumbilical area and left upper quadrant. There is guarding. There is no rigidity, no rebound, no CVA tenderness and negative Murphy's sign.  obese  Musculoskeletal: She exhibits no edema.  Skin: Skin is warm and dry. No rash noted.  Psychiatric: Her affect is blunt.       Assessment & Plan:

## 2013-03-17 ENCOUNTER — Telehealth: Payer: Self-pay

## 2013-03-17 ENCOUNTER — Ambulatory Visit: Payer: Self-pay | Admitting: Family Medicine

## 2013-03-17 ENCOUNTER — Encounter: Payer: Self-pay | Admitting: Family Medicine

## 2013-03-17 NOTE — Telephone Encounter (Signed)
Stephanie with [facility] [place] called preliminary report Abd [place]; Limited to see due to bowel gas and body habitus. See rt renal cyst and lt renal cyst.Everything else appeared within normal limits. Did not see pancreas or all of aorta. Pt went home and will wait for call from Dr Sharen Hones.

## 2013-03-17 NOTE — Telephone Encounter (Signed)
Called report given to Dr Sharen Hones and sent electronically.

## 2013-03-17 NOTE — Telephone Encounter (Signed)
spoke with patient - reviewed overall normal abd [place]. Endorses persistent pain, now with dysuria and some incomplete emptying.  Advised to seek urgent care for UTI eval tonight.  Pt agrees with plan.

## 2013-03-29 ENCOUNTER — Telehealth: Payer: Self-pay | Admitting: Family Medicine

## 2013-03-29 DIAGNOSIS — K2970 Gastritis, unspecified, without bleeding: Secondary | ICD-10-CM

## 2013-03-29 DIAGNOSIS — R109 Unspecified abdominal pain: Secondary | ICD-10-CM

## 2013-03-29 DIAGNOSIS — K219 Gastro-esophageal reflux disease without esophagitis: Secondary | ICD-10-CM

## 2013-03-29 NOTE — Telephone Encounter (Signed)
Pt says she is still having issues w/her stomach and would like another referral to Dr. Jarold Motto @ [facility] in [place]. Can you send referral or do you need to see her first? Thank you.

## 2013-03-29 NOTE — Telephone Encounter (Signed)
Pt notified referral made and Marion/Linda will call to schedule appt

## 2013-04-05 ENCOUNTER — Telehealth: Payer: Self-pay

## 2013-04-05 NOTE — Telephone Encounter (Signed)
Returned pt's call. Pt states her bg numbers have been between 300 - 400 regularly since decreasing her Lantus to 30 units. Pt states she is concerned and is trying her best. Asked her if she was using the sliding for her Novolog, pt said she is. Pt states she has gum disease and has to go to the dentist tomorrow, but feels they will not do anything while her bg is so high. Please advise.

## 2013-04-05 NOTE — Telephone Encounter (Signed)
Alexa Woods, I tried to call the pt several times from different phones >> cannot get a dial tone. Can you please ask her to take 9 units of the Novolog with each meal and + the SSI and may increase Lantus to 40. Advise her to call us back on Friday to see how she is doing. Thank you.

## 2013-04-05 NOTE — Telephone Encounter (Signed)
Called pt and advised her per Dr Elvera Lennox to increase her novolog to 9 units with ea meal + the sliding scale she gave her at the last appt. Also, she may increase Lantus to 40 units. Advised to to call us back on Friday with her bg numbers and let us know how she is doing. Pt understood.

## 2013-04-05 NOTE — Telephone Encounter (Signed)
Pt left v/m requesting cb [phone], pt left v/m that blood sugars are "out of whack." Carollee Herter said to send note to her and she will contact pt.

## 2013-04-14 ENCOUNTER — Other Ambulatory Visit: Payer: Self-pay | Admitting: Family Medicine

## 2013-04-18 ENCOUNTER — Ambulatory Visit: Payer: Managed Care, Other (non HMO) | Admitting: Gastroenterology

## 2013-05-19 ENCOUNTER — Emergency Department: Payer: Self-pay | Admitting: Emergency Medicine

## 2013-05-19 LAB — COMPREHENSIVE METABOLIC PANEL
Albumin: 3.9 g/dL (ref 3.4–5.0)
Alkaline Phosphatase: 114 U/L
Anion Gap: 3 — ABNORMAL LOW (ref 7–16)
Chloride: 100 mmol/L (ref 98–107)
Co2: 35 mmol/L — ABNORMAL HIGH (ref 21–32)
EGFR (Non-African Amer.): >60
Glucose: 242 mg/dL — ABNORMAL HIGH (ref 65–99)
Osmolality: 285 (ref 275–301)
SGPT (ALT): 27 U/L (ref 12–78)
Total Protein: 8.4 g/dL — ABNORMAL HIGH (ref 6.4–8.2)

## 2013-05-19 LAB — URINALYSIS, COMPLETE
Bacteria: NONE SEEN
Bilirubin,UR: NEGATIVE
Blood: NEGATIVE
Glucose,UR: >=500 mg/dL (ref 0–75)
Nitrite: NEGATIVE
Ph: 5 (ref 4.5–8.0)
Protein: NEGATIVE
RBC,UR: 1 /HPF (ref 0–5)
Specific Gravity: 1.021 (ref 1.003–1.030)
Squamous Epithelial: 2
WBC UR: <1 /HPF (ref 0–5)

## 2013-05-19 LAB — CBC WITH DIFFERENTIAL/PLATELET
Basophil #: 0 10*3/uL (ref 0.0–0.1)
Basophil %: 0.5 %
Eosinophil %: 1.2 %
HCT: 42.2 % (ref 35.0–47.0)
HGB: 14.2 g/dL (ref 12.0–16.0)
Lymphocyte #: 1.1 10*3/uL (ref 1.0–3.6)
Lymphocyte %: 15.2 %
MCH: 27.2 pg (ref 26.0–34.0)
MCHC: 33.6 g/dL (ref 32.0–36.0)
MCV: 81 fL (ref 80–100)
Neutrophil #: 5.4 10*3/uL (ref 1.4–6.5)
Neutrophil %: 77.7 %
RDW: 13.6 % (ref 11.5–14.5)
WBC: 7 10*3/uL (ref 3.6–11.0)

## 2013-05-26 ENCOUNTER — Emergency Department: Payer: Self-pay | Admitting: Emergency Medicine

## 2013-05-26 ENCOUNTER — Telehealth: Payer: Self-pay

## 2013-05-26 LAB — CBC WITH DIFFERENTIAL/PLATELET
Eosinophil #: 0 10*3/uL (ref 0.0–0.7)
Eosinophil %: 0.5 %
Lymphocyte #: 2 10*3/uL (ref 1.0–3.6)
MCH: 27.9 pg (ref 26.0–34.0)
MCV: 82 fL (ref 80–100)
Monocyte #: 0.4 x10 3/mm (ref 0.2–0.9)
Neutrophil #: 6.2 10*3/uL (ref 1.4–6.5)
Neutrophil %: 71.6 %
Platelet: 284 10*3/uL (ref 150–440)
RBC: 4.88 10*6/uL (ref 3.80–5.20)
RDW: 13.3 % (ref 11.5–14.5)

## 2013-05-26 LAB — URINALYSIS, COMPLETE
Bacteria: NONE SEEN
Ketone: NEGATIVE
Nitrite: NEGATIVE
RBC,UR: 1 /HPF (ref 0–5)

## 2013-05-26 LAB — COMPREHENSIVE METABOLIC PANEL
Albumin: 3.6 g/dL (ref 3.4–5.0)
Alkaline Phosphatase: 125 U/L — ABNORMAL HIGH
Anion Gap: 4 — ABNORMAL LOW (ref 7–16)
BUN: 18 mg/dL (ref 7–18)
Bilirubin,Total: 0.3 mg/dL (ref 0.2–1.0)
Calcium, Total: 8.9 mg/dL (ref 8.5–10.1)
Co2: 30 mmol/L (ref 21–32)
EGFR (African American): >60
Glucose: 310 mg/dL — ABNORMAL HIGH (ref 65–99)
Osmolality: 280 (ref 275–301)
SGOT(AST): 28 U/L (ref 15–37)
Total Protein: 8.1 g/dL (ref 6.4–8.2)

## 2013-05-26 LAB — LIPASE, BLOOD: Lipase: 175 U/L (ref 73–393)

## 2013-05-26 LAB — TROPONIN I: Troponin-I: <0.02 ng/mL

## 2013-05-26 NOTE — Telephone Encounter (Addendum)
Pt was seen 03/2013 for lt ribcage pain;hurts worse after eats.pt also has stinging pain under breast and at lt nipple;  Pt taking omeprazole 40 mg once daily is not helping. Pt said was seen 1 week ago at [facility] ED as advised if no improvement to f/u with PCP.  Pt's pain level goes between 5-8. Pt is nauseated but has not vomited. No fever, no SOB. Pt does not have transportation on 05/29/13 but does have transportation for today. Pt called [facility] this morning and has not heard back. Sat [facility] at [facility] has no available appts on 05/27/13. Pt said she will go back to [facility] ED. [place]

## 2013-05-26 NOTE — Telephone Encounter (Signed)
Sounds like she went to ED Please call for records when they are ready  Please find out why she did not hear back from nurse line-thanks

## 2013-06-06 NOTE — Telephone Encounter (Signed)
Records received and placed in your inbox.

## 2013-06-06 NOTE — Telephone Encounter (Signed)
Faxed for ER records.  Note forwarded to [facility] to investigate [facility] issue.

## 2013-06-09 NOTE — Telephone Encounter (Signed)
[facility] is looking into this.  Thanks~!

## 2013-06-23 ENCOUNTER — Other Ambulatory Visit: Payer: Self-pay | Admitting: Family Medicine

## 2013-07-31 ENCOUNTER — Emergency Department (HOSPITAL_COMMUNITY)
Admission: EM | Admit: 2013-07-31 | Discharge: 2013-07-31 | Disposition: A | Discharge status: Home / Self Care | Payer: Managed Care, Other (non HMO) | Attending: Emergency Medicine | Admitting: Emergency Medicine

## 2013-07-31 ENCOUNTER — Emergency Department (HOSPITAL_COMMUNITY): Payer: Managed Care, Other (non HMO)

## 2013-07-31 ENCOUNTER — Encounter (HOSPITAL_COMMUNITY): Payer: Self-pay | Admitting: Emergency Medicine

## 2013-07-31 DIAGNOSIS — Z79899 Other long term (current) drug therapy: Secondary | ICD-10-CM | POA: Insufficient documentation

## 2013-07-31 DIAGNOSIS — I252 Old myocardial infarction: Secondary | ICD-10-CM | POA: Insufficient documentation

## 2013-07-31 DIAGNOSIS — Z872 Personal history of diseases of the skin and subcutaneous tissue: Secondary | ICD-10-CM | POA: Insufficient documentation

## 2013-07-31 DIAGNOSIS — F3289 Other specified depressive episodes: Secondary | ICD-10-CM | POA: Insufficient documentation

## 2013-07-31 DIAGNOSIS — Y929 Unspecified place or not applicable: Secondary | ICD-10-CM | POA: Insufficient documentation

## 2013-07-31 DIAGNOSIS — Z794 Long term (current) use of insulin: Secondary | ICD-10-CM | POA: Insufficient documentation

## 2013-07-31 DIAGNOSIS — Z87891 Personal history of nicotine dependence: Secondary | ICD-10-CM | POA: Insufficient documentation

## 2013-07-31 DIAGNOSIS — M545 Low back pain, unspecified: Secondary | ICD-10-CM

## 2013-07-31 DIAGNOSIS — E119 Type 2 diabetes mellitus without complications: Secondary | ICD-10-CM | POA: Insufficient documentation

## 2013-07-31 DIAGNOSIS — Y9389 Activity, other specified: Secondary | ICD-10-CM | POA: Insufficient documentation

## 2013-07-31 DIAGNOSIS — I10 Essential (primary) hypertension: Secondary | ICD-10-CM | POA: Insufficient documentation

## 2013-07-31 DIAGNOSIS — Z8669 Personal history of other diseases of the nervous system and sense organs: Secondary | ICD-10-CM | POA: Insufficient documentation

## 2013-07-31 DIAGNOSIS — Z8742 Personal history of other diseases of the female genital tract: Secondary | ICD-10-CM | POA: Insufficient documentation

## 2013-07-31 DIAGNOSIS — Z8781 Personal history of (healed) traumatic fracture: Secondary | ICD-10-CM | POA: Insufficient documentation

## 2013-07-31 DIAGNOSIS — R3915 Urgency of urination: Secondary | ICD-10-CM | POA: Insufficient documentation

## 2013-07-31 DIAGNOSIS — X500XXA Overexertion from strenuous movement or load, initial encounter: Secondary | ICD-10-CM | POA: Insufficient documentation

## 2013-07-31 DIAGNOSIS — F329 Major depressive disorder, single episode, unspecified: Secondary | ICD-10-CM | POA: Insufficient documentation

## 2013-07-31 DIAGNOSIS — J45909 Unspecified asthma, uncomplicated: Secondary | ICD-10-CM | POA: Insufficient documentation

## 2013-07-31 DIAGNOSIS — F411 Generalized anxiety disorder: Secondary | ICD-10-CM | POA: Insufficient documentation

## 2013-07-31 DIAGNOSIS — M549 Dorsalgia, unspecified: Secondary | ICD-10-CM

## 2013-07-31 DIAGNOSIS — K219 Gastro-esophageal reflux disease without esophagitis: Secondary | ICD-10-CM | POA: Insufficient documentation

## 2013-07-31 DIAGNOSIS — Z8619 Personal history of other infectious and parasitic diseases: Secondary | ICD-10-CM | POA: Insufficient documentation

## 2013-07-31 DIAGNOSIS — G8929 Other chronic pain: Secondary | ICD-10-CM | POA: Insufficient documentation

## 2013-07-31 DIAGNOSIS — IMO0002 Reserved for concepts with insufficient information to code with codable children: Secondary | ICD-10-CM | POA: Insufficient documentation

## 2013-07-31 MED ORDER — OXYCODONE-ACETAMINOPHEN 5-325 MG PO TABS
2.0000 | ORAL_TABLET | Freq: Once | ORAL | Status: AC
  Administered 2013-07-31: 2 via ORAL
  Filled 2013-07-31: qty 2

## 2013-07-31 MED ORDER — ONDANSETRON 4 MG PO TBDP
4.0000 mg | ORAL_TABLET | Freq: Once | ORAL | Status: AC
  Administered 2013-07-31: 4 mg via ORAL
  Filled 2013-07-31: qty 1

## 2013-07-31 MED ORDER — HYDROMORPHONE HCL PF 1 MG/ML IJ SOLN
1.0000 mg | Freq: Once | INTRAMUSCULAR | Status: AC
  Administered 2013-07-31: 1 mg via INTRAMUSCULAR
  Filled 2013-07-31: qty 1

## 2013-07-31 NOTE — ED Notes (Signed)
Pt states she injured her back 3 weeks ago. Pt states she has pain to mid-lower back. Pt admits to being on a pain management contract. Pt ambulatory to exam room.

## 2013-07-31 NOTE — ED Provider Notes (Signed)
CSN: [id number]     Arrival date & time 07/31/13  1726 History   First MD Initiated Contact with Patient 07/31/13 1806     This chart was scribed for non-physician practitioner, Delos Haring PA-C working with Mariea Clonts, MD by Forrestine Him, ED Scribe. This patient was seen in room WTR7/WTR7 and the patient's care was started at 6:11 PM.   Chief Complaint  Patient presents with  . Back Pain   The history is provided by the patient. No language interpreter was used.    HPI Comments: Alexa Woods is a 53 y.o. female with a PMHx of back pain who presents to the Emergency Department complaining of constant, gradually worsening, severe lower back pain that radiates down into the left lower extremity with associated urinary urgency that initially started about 3 weeks ago, but has recently worsened in the last week. She states she felt a "pull" to her back after playing with her dog. She states sitting for long period of time and movement worsens her discomfort. At this time she denies any alleviating factors. Pt admits to currently being in a pain management contract, but states she has an outstanding balance and has not been unable to follow up. At this time she denies any fever or chills. No other complaints or concerns this visit.  Past Medical History  Diagnosis Date  . Epigastric abdominal pain   . Alcohol abuse, unspecified   . Allergic rhinitis, cause unspecified   . Anxiety state, unspecified   . Unspecified asthma(493.90)   . Backache, unspecified   . Other chronic cystitis   . Depressive disorder, not elsewhere classified   . Type II or unspecified type diabetes mellitus without mention of complication, not stated as uncontrolled   . Dysphagia, unspecified(787.20)   . Esophageal reflux   . Unspecified hemorrhoids without mention of complication   . Herpes zoster without mention of complication   . Unspecified essential hypertension   . Hypopotassemia   . Morbid obesity   .  Other screening mammogram   . Pain in joint, lower leg   . Other psoriasis   . History of Bell's palsy 4/09  . Wrist fracture   . Edema   . Gastroparesis   . Myocardial infarction    Past Surgical History  Procedure Laterality Date  . Appendectomy    . Cholecystectomy    . Partial hysterectomy    . Knee arthroscopy w/ partial medial meniscectomy  10/2008    and chondroplasty  . Total knee arthroplasty    . Esophagogastroduodenoscopy  07/2002    erythematous gastropathy  . Shoulder surgery  05/2010  . Dobutamine stress echo  2009     normal stress echo, no evidence of ischemia  . Mri  11/2102    pt states she has a bone spur on her spine   Family History  Problem Relation Age of Onset  . Alcohol abuse Mother   . Hypertension Mother   . Esophageal cancer Maternal Grandmother    History  Substance Use Topics  . Smoking status: Former Smoker    Quit date: 12/30/2001  . Smokeless tobacco: Never Used  . Alcohol Use: No     Comment: former alcoholic    OB History   Grav Para Term Preterm Abortions TAB SAB Ect Mult Living                 Review of Systems  Constitutional: Negative for fever and chills.  HENT: Negative  for congestion.   Eyes: Negative for redness.  Respiratory: Negative for cough.   Genitourinary: Positive for urgency.  Musculoskeletal: Positive for back pain.  Skin: Negative for rash.  Neurological: Negative for numbness.  Psychiatric/Behavioral: Negative for confusion.  All other systems reviewed and are negative.    Allergies  Bupropion hcl; Cefuroxime axetil; Codeine; Lidocaine; Metformin; Paroxetine; Propoxyphene n-acetaminophen; Sulfonamide derivatives; and Tramadol hcl  Home Medications   Current Outpatient Rx  Name  Route  Sig  Dispense  Refill  . albuterol (PROVENTIL HFA;VENTOLIN HFA) 108 (90 BASE) MCG/ACT inhaler   Inhalation   Inhale 2 puffs into the lungs every 4 (four) hours as needed for wheezing or shortness of breath.          . fluticasone (FLONASE) 50 MCG/ACT nasal spray   Nasal   Place 2 sprays into the nose daily as needed for allergies.          . Fluticasone-Salmeterol (ADVAIR) 250-50 MCG/DOSE AEPB   Inhalation   Inhale 1 puff into the lungs 2 (two) times daily as needed.         . hydrochlorothiazide (HYDRODIURIL) 25 MG tablet   Oral   Take 1 tablet (25 mg total) by mouth daily.   30 tablet   11   . Insulin Glargine (LANTUS SOLOSTAR) 100 UNIT/ML SOPN      INJECT 45 UNITS INTO THE SKIN EVERY NIGHT AT BEDTIME AS DIRECTED         . LORazepam (ATIVAN) 1 MG tablet   Oral   Take 1 mg by mouth every 8 (eight) hours as needed for anxiety or sleep.          Marland Kitchen NOVOLOG FLEXPEN 100 UNIT/ML SOPN FlexPen      USE AS DIRECTED ON A SLIDING SCALE   15 mL   5   . olopatadine (PATANOL) 0.1 % ophthalmic solution   Both Eyes   Place 2 drops into both eyes 3 (three) times daily as needed.           Marland Kitchen omeprazole (PRILOSEC) 20 MG capsule   Oral   Take 2 capsules (40 mg total) by mouth daily.   60 capsule   3   . oxyCODONE-acetaminophen (PERCOCET) 10-325 MG per tablet   Oral   Take 1 tablet by mouth every 6 (six) hours as needed. For pain          . perphenazine (TRILAFON) 2 MG tablet   Oral   Take 2 mg by mouth at bedtime.         . potassium chloride (K-DUR,KLOR-CON) 10 MEQ tablet      TAKE 1 TABLET BY MOUTH DAILY   30 tablet   1   . sertraline (ZOLOFT) 100 MG tablet   Oral   Take 200 mg by mouth daily.          Marland Kitchen tiZANidine (ZANAFLEX) 4 MG tablet   Oral   Take 4 mg by mouth every 8 (eight) hours as needed. For muscle spasm          . zolpidem (AMBIEN) 10 MG tablet   Oral   Take 15 mg by mouth at bedtime as needed for sleep.           Triage Vitals: BP 113/78  Pulse 88  Temp(Src) 98.2 F (36.8 C) (Oral)  Resp 16  SpO2 96%    Physical Exam  Nursing note and vitals reviewed. Constitutional: She is oriented to person, place, and time.  She appears well-developed  and well-nourished.  HENT:  Head: Normocephalic and atraumatic.  Eyes: EOM are normal.  Neck: Normal range of motion.  Cardiovascular: Normal rate.   Pulmonary/Chest: Effort normal.  Musculoskeletal: Normal range of motion.  Neurological: She is alert and oriented to person, place, and time.  Skin: Skin is warm and dry.  Psychiatric: She has a normal mood and affect. Her behavior is normal.    ED Course  Procedures (including critical care time)  DIAGNOSTIC STUDIES: Oxygen Saturation is 96% on RA, Normal by my interpretation.    COORDINATION OF CARE: 6:30 PM- Will order X-Ray. Will give pain medication. Discussed treatment plan with pt at bedside and pt agreed to plan.     Labs Review Labs Reviewed - No data to display Imaging Review Dg Lumbar Spine Complete  07/31/2013   CLINICAL DATA:  Back pain.  EXAM: LUMBAR SPINE - COMPLETE 4+ VIEW  COMPARISON:  CT 02/06/2011  FINDINGS: Vertebral body alignment, heights and disc space heights are within normal. There is mild spondylosis present. There is no compression fracture or subluxation. There is mild facet arthropathy.  IMPRESSION: No acute findings.  Mild spondylosis to include facet arthropathy.   Electronically Signed   By: Marin Olp M.D.   On: 07/31/2013 19:50     EKG Interpretation None      MDM   Final diagnoses:  Chronic back pain  Low back pain    52 y.o.Alexa Woods's  with back pain. No neurological deficits and normal neuro exam. Patient can walk but states is painful. No loss of bowel or bladder control. No concern for cauda equina. No fever, night sweats, weight loss, h/o cancer, IVDU. RICE protocol and pain medicine indicated and discussed with patient.   Patient Plan 1. Medications:  usual home medications - on a pain management contract therefore will not write narcotics today, pt agrees with plan. 2. Treatment: rest, drink plenty of fluids, gentle stretching as discussed, alternate ice and heat  3.  Follow Up: Please followup with your primary doctor for discussion of your diagnoses and further evaluation after today's visit; if you do not have a primary care doctor use the resource guide provided to find one   Vital signs are stable at discharge. Filed Vitals:   07/31/13 1800  BP: 113/78  Pulse: 88  Temp: 98.2 F (36.8 C)  Resp: 16    Patient/guardian has voiced understanding and agreed to follow-up with the PCP or specialist.      I personally performed the services described in this documentation, which was scribed in my presence. The recorded information has been reviewed and is accurate.   Linus Mako, PA-C 07/31/13 2002

## 2013-07-31 NOTE — ED Notes (Signed)
Patient transported to X-ray 

## 2013-07-31 NOTE — ED Notes (Signed)
Pt states she has a ride home. 

## 2013-07-31 NOTE — Discharge Instructions (Signed)
Back Pain, Adult °Low back pain is very common. About 1 in 5 people have back pain. The cause of low back pain is rarely dangerous. The pain often gets better over time. About half of people with a sudden onset of back pain feel better in just 2 weeks. About 8 in 10 people feel better by 6 weeks.  °CAUSES °Some common causes of back pain include: °· Strain of the muscles or ligaments supporting the spine. °· Wear and tear (degeneration) of the spinal discs. °· Arthritis. °· Direct injury to the back. °DIAGNOSIS °Most of the time, the direct cause of low back pain is not known. However, back pain can be treated effectively even when the exact cause of the pain is unknown. Answering your caregiver's questions about your overall health and symptoms is one of the most accurate ways to make sure the cause of your pain is not dangerous. If your caregiver needs more information, he or she may order lab work or imaging tests (X-rays or MRIs). However, even if imaging tests show changes in your back, this usually does not require surgery. °HOME CARE INSTRUCTIONS °For many people, back pain returns. Since low back pain is rarely dangerous, it is often a condition that people can learn to manage on their own.  °· Remain active. It is stressful on the back to sit or stand in one place. Do not sit, drive, or stand in one place for more than 30 minutes at a time. Take short walks on level surfaces as soon as pain allows. Try to increase the length of time you walk each day. °· Do not stay in bed. Resting more than 1 or 2 days can delay your recovery. °· Do not avoid exercise or work. Your body is made to move. It is not dangerous to be active, even though your back may hurt. Your back will likely heal faster if you return to being active before your pain is gone. °· Pay attention to your body when you  bend and lift. Many people have less discomfort when lifting if they bend their knees, keep the load close to their bodies, and  avoid twisting. Often, the most comfortable positions are those that put less stress on your recovering back. °· Find a comfortable position to sleep. Use a firm mattress and lie on your side with your knees slightly bent. If you lie on your back, put a pillow under your knees. °· Only take over-the-counter or prescription medicines as directed by your caregiver. Over-the-counter medicines to reduce pain and inflammation are often the most helpful. Your caregiver may prescribe muscle relaxant drugs. These medicines help dull your pain so you can more quickly return to your normal activities and healthy exercise. °· Put ice on the injured area. °· Put ice in a plastic bag. °· Place a towel between your skin and the bag. °· Leave the ice on for 15-20 minutes, 03-04 times a day for the first 2 to 3 days. After that, ice and heat may be alternated to reduce pain and spasms. °· Ask your caregiver about trying back exercises and gentle massage. This may be of some benefit. °· Avoid feeling anxious or stressed. Stress increases muscle tension and can worsen back pain. It is important to recognize when you are anxious or stressed and learn ways to manage it. Exercise is a great option. °SEEK MEDICAL CARE IF: °· You have pain that is not relieved with rest or medicine. °· You have pain that does not improve in 1 week. °· You have new symptoms. °· You are generally not feeling well. °SEEK   IMMEDIATE MEDICAL CARE IF:   You have pain that radiates from your back into your legs.  You develop new bowel or bladder control problems.  You have unusual weakness or numbness in your arms or legs.  You develop nausea or vomiting.  You develop abdominal pain.  You feel faint. Document Released: 05/18/2005 Document Revised: 11/17/2011 Document Reviewed: 10/06/2010 [facility] Patient Information 2014 [place], [place].   RESOURCE GUIDE  Chronic Pain Problems: Contact [facility]  [phone] Patients need  to be referred by their primary care doctor.  Insufficient Money for Medicine: Contact [employer] Way:  call "211" or [place] [phone].  No Primary Care Doctor: Call [facility] Connect  [phone] - can help you locate a primary care doctor that  accepts your insurance, provides certain services, etc. Physician Referral Service- [phone]  Agencies that provide inexpensive medical care: [facility]  [facility]  [phone] [facility]  [phone] [facility]  [phone] Planned Parenthood  [phone] [facility]  [phone]  [place] Providers: [facility]- [address], [address]  [phone], Mon-Fri 9am-7pm, Sat 9am-1pm [place], [address] [place], [address] [place]  [place]- [address]  [place], [address], [phone]  Only accepts [place] patients after they have their name  applied to their card  Self Pay (no insurance) in [facility]: Sickle Cell Patients: Dr Kevan Ny, [facility]  [place], [facility]- [place]  [facility] [place]- [address], [facility]- see information above (Speak to [employer] if you do not have insurance)       -  [facility]- [employer], [place],  [place], [place]  Dr Vista Lawman-  [address], [place], [facility]- [address], [phone]       -  [facility]- [address], [place], also [address], [phone]       -     [facility]- [address], [place], 1st & 3rd Saturday   every month, 10am-1pm  1) Find a Doctor and Pay Out of Pocket Although you won't have to find out who is covered by your insurance plan, it is a good idea to ask around and get recommendations. You will then need to call the office and see if the doctor you have chosen will accept you as a new patient and what types of options they offer for patients who are self-pay. Some doctors offer discounts or will set up payment plans for their patients who do not have insurance, but you will need to ask so you aren't surprised when you get to your appointment.  2) Contact Your Local Health Department Not all health departments have doctors that can  see patients for sick visits, but many do, so it is worth a call to see if yours does. If you don't know where your local health department is, you can check in your phone book. The CDC also has a tool to help you locate your state's health department, and many state websites also have listings of all of their local health departments.  3) Find a [facility] If your illness is not likely to be very severe or complicated, you may want to try a walk in clinic. These are popping up all over the country in pharmacies, drugstores, and shopping centers. They're usually staffed by nurse practitioners or physician assistants that have been trained to treat common illnesses and complaints. They're usually fairly quick and inexpensive. However, if you have serious medical issues or chronic medical problems, these are probably not your best option  [facility] [place], [facility], [address] [place], phone [phone] or [phone].  Monday - Friday, call for an appointment. [place], [facility], [address], [place], phone [phone] or [phone].  Monday - Friday, call for an  appointment.  Abuse/Neglect: [place] [phone] [place] [phone] (After Hours)  Emergency Shelter:  [facility] [phone]  Maternity Homes: Room at the [place] [phone] [place] [phone]  MRSA Hotline #:   (857)099-5814  [facility] Dept. [address]         [place] Phone:  [phone]                                  Phone:  [phone]                   Phone:  [phone]  [facility], [place] Vera- [phone]       -     [facility] in [place], [address],                                  [place] [phone] or [phone] (After Hours)   [place]  Substance Abuse Resources: Alcohol and Drug Services  [phone] [employer] [phone] [facility] [place] Chinita Pester 9302468371 Residential & Outpatient Substance Abuse Program  [phone]  Psychological Services: [place]  [phone] [place]  [place], [place]. [address] [place], [place]: [phone] or [phone], [email or web address][employer]uy  Dental Assistance  If unable to pay or  uninsured, contact:  [facility] or [facility] to become qualified for the adult dental clinic.  Patients with Medicaid: [facility] [phone] [address] [place], [place] [address] [phone]  If unable to pay, or uninsured, contact  [facility] [phone]) or [place] [phone] in [place], [place] in [facility]) to become qualified for the adult dental clinic  Other [place]- [address], [place], [place], [address], [place], [place], 2nd and 4th Thursday of the month at 6:30am.  10 clients each day by appointment, can sometimes see walk-in patients if someone does not show for an appointment. [facility]- [address] Hillard Danker [place], [place], [address], [employer], [place], [place], [address], Lennon Crocker [facility] [phone]  Please make every effort to establish with a primary care physician for routine medical care  [place]  The [place] provides a wide range of adult health services. Some of these services are designed to address the healthcare needs of all [facility] residents and all services are designed to meet the needs of uninsured/underinsured low income residents. Some services are available to any resident of [place], call [phone] for details. ] The [facility], a new medical clinic for adults, is now open. For more information about the Center and its services please call [phone]. For information on our [place] services, click here.  For more information on any of the following Department of Public Health programs, including hours of service, click on the highlighted link.  SERVICES FOR WOMEN (Adults and Teens) [employer] provide a full range of birth control options plus education and counseling. New patient visit and annual return visits include a complete examination, pap test as indicated, and other laboratory as indicated. Included is our [employer]  for men.  Maternity Care is provided through pregnancy, including a six week post partum exam. Women who meet eligibility criteria for the Medicaid for Pregnant Women program, receive care free. Other women are charged on a sliding scale according to income. Note: [facility] provides services to pregnant women who have a Medicaid card. Call [phone] for an appointment in [place] or [phone] for an appointment in [facility].  Primary Care for Medicaid [place] Access Women is available through the [place]. As primary care provider for the [place] program, women may designate the [facility] clinic as their primary care provider.  PLEASE CALL [phone] FOR AN APPOINTMENT FOR THE ABOVE SERVICES IN EITHER [place] OR [place]. Information available in [place] and [place].   Childbirth Education Classes are open to the public and offered to help families prepare for the best possible childbirth experience as well as to promote lifelong health and wellness. Classes are offered throughout the year and meet on the same night once a week for five weeks. Medicaid covers the cost of the classes for the mother-to-be and her partner. For participants without Medicaid, the cost of the class series is $45.00 for the mother-to-be and her partner. Class size is limited and registration is required. For more information or to register call [phone]. Baby items donated by [employer] and the [employer] [place] are given away during each class series.  SERVICES FOR WOMEN AND  MEN Sexually Transmitted Infection appointments, including HIV testing, are available daily (weekdays, except holidays). Call early as same-day appointments are limited. For an appointment in either [facility] or [place], call [phone]. Services are confidential and free of charge.  Skin Testing for Tuberculosis Please call  [phone]. Adult Immunizations are available, usually for a fee. Please call [phone] for details.  PLEASE CALL [phone] FOR AN APPOINTMENT FOR THE ABOVE SERVICES IN EITHER [place] OR [place].   [facility] provides up to the minute recommended vaccines for your travel destination. We also provide essential health and political information to help insure a safe and pleasurable travel experience. This program is self-sustaining, however, fees are very competitive. We are a CERTIFIED YELLOW FEVER IMMUNIZATION approved clinic site. PLEASE CALL [phone] FOR AN APPOINTMENT IN EITHER [place] OR [place].   If you have questions about the services listed above, we want to answer them! Email [place] at: [email or web address] Home Visiting Services for elderly and the disabled are available to residents of [facility] who are in need of care that compares to the care offered by a nursing home, have needs that can be met by the program, and have [place] Medicaid. Other short term services are available to residents 18 years and older who are unable to meet requirements for eligibility to receive services from a certified home health agency, spend the majority of time at home, and need care for six months or less.  PLEASE CALL [id number] OR [phone] FOR MORE INFORMATION. Medication Assistance Program serves as a link between pharmaceutical companies and patients to provide low cost or free prescription medications. This servce is available for residents who meet certain income restrictions and have no insurance coverage.  PLEASE CALL [phone] ([place]) OR [phone] ([place]) FOR MORE INFORMATION.  Updated Feb. 21, 2013

## 2013-08-01 ENCOUNTER — Telehealth: Payer: Self-pay

## 2013-08-01 NOTE — ED Provider Notes (Signed)
Medical screening examination/treatment/procedure(s) were performed by non-physician practitioner and as supervising physician I was immediately available for consultation/collaboration.   EKG Interpretation None        Mariea Clonts, MD 08/01/13 [phone]

## 2013-08-01 NOTE — Telephone Encounter (Signed)
Pt left v /m wanting to know if could get excuse for jury duty; also pt was seen ED 07/31/13 with back pain; pts back is still very painful and pt wants to know when pt should f/u with Dr Glori Bickers. Pt request cb.

## 2013-08-01 NOTE — Telephone Encounter (Signed)
If her back pain is not improved next week -make appt with me  When is her jury duty?

## 2013-08-02 ENCOUNTER — Emergency Department (HOSPITAL_COMMUNITY)
Admission: EM | Admit: 2013-08-02 | Discharge: 2013-08-02 | Disposition: A | Discharge status: Home / Self Care | Payer: Managed Care, Other (non HMO) | Attending: Emergency Medicine | Admitting: Emergency Medicine

## 2013-08-02 DIAGNOSIS — M545 Low back pain, unspecified: Secondary | ICD-10-CM | POA: Insufficient documentation

## 2013-08-02 DIAGNOSIS — Z79899 Other long term (current) drug therapy: Secondary | ICD-10-CM | POA: Insufficient documentation

## 2013-08-02 DIAGNOSIS — F329 Major depressive disorder, single episode, unspecified: Secondary | ICD-10-CM | POA: Insufficient documentation

## 2013-08-02 DIAGNOSIS — K219 Gastro-esophageal reflux disease without esophagitis: Secondary | ICD-10-CM | POA: Insufficient documentation

## 2013-08-02 DIAGNOSIS — G8929 Other chronic pain: Secondary | ICD-10-CM

## 2013-08-02 DIAGNOSIS — F411 Generalized anxiety disorder: Secondary | ICD-10-CM | POA: Insufficient documentation

## 2013-08-02 DIAGNOSIS — I10 Essential (primary) hypertension: Secondary | ICD-10-CM | POA: Insufficient documentation

## 2013-08-02 DIAGNOSIS — Z794 Long term (current) use of insulin: Secondary | ICD-10-CM | POA: Insufficient documentation

## 2013-08-02 DIAGNOSIS — F3289 Other specified depressive episodes: Secondary | ICD-10-CM | POA: Insufficient documentation

## 2013-08-02 DIAGNOSIS — Z9089 Acquired absence of other organs: Secondary | ICD-10-CM | POA: Insufficient documentation

## 2013-08-02 DIAGNOSIS — Z8742 Personal history of other diseases of the female genital tract: Secondary | ICD-10-CM | POA: Insufficient documentation

## 2013-08-02 DIAGNOSIS — Z8669 Personal history of other diseases of the nervous system and sense organs: Secondary | ICD-10-CM | POA: Insufficient documentation

## 2013-08-02 DIAGNOSIS — Z87891 Personal history of nicotine dependence: Secondary | ICD-10-CM | POA: Insufficient documentation

## 2013-08-02 DIAGNOSIS — J45909 Unspecified asthma, uncomplicated: Secondary | ICD-10-CM | POA: Insufficient documentation

## 2013-08-02 DIAGNOSIS — I252 Old myocardial infarction: Secondary | ICD-10-CM | POA: Insufficient documentation

## 2013-08-02 DIAGNOSIS — E119 Type 2 diabetes mellitus without complications: Secondary | ICD-10-CM | POA: Insufficient documentation

## 2013-08-02 DIAGNOSIS — M549 Dorsalgia, unspecified: Secondary | ICD-10-CM

## 2013-08-02 DIAGNOSIS — R209 Unspecified disturbances of skin sensation: Secondary | ICD-10-CM | POA: Insufficient documentation

## 2013-08-02 DIAGNOSIS — Z8781 Personal history of (healed) traumatic fracture: Secondary | ICD-10-CM | POA: Insufficient documentation

## 2013-08-02 DIAGNOSIS — Z8619 Personal history of other infectious and parasitic diseases: Secondary | ICD-10-CM | POA: Insufficient documentation

## 2013-08-02 DIAGNOSIS — Z872 Personal history of diseases of the skin and subcutaneous tissue: Secondary | ICD-10-CM | POA: Insufficient documentation

## 2013-08-02 MED ORDER — DIAZEPAM 5 MG PO TABS
5.0000 mg | ORAL_TABLET | Freq: Once | ORAL | Status: AC
  Administered 2013-08-02: 5 mg via ORAL
  Filled 2013-08-02: qty 1

## 2013-08-02 MED ORDER — HYDROMORPHONE HCL PF 1 MG/ML IJ SOLN
1.0000 mg | Freq: Once | INTRAMUSCULAR | Status: AC
  Administered 2013-08-02: 1 mg via INTRAMUSCULAR
  Filled 2013-08-02: qty 1

## 2013-08-02 MED ORDER — DIAZEPAM 5 MG PO TABS: 5.0000 mg | ORAL_TABLET | Freq: Three times a day (TID) | ORAL | Status: DC | PRN

## 2013-08-02 NOTE — Telephone Encounter (Signed)
Left voicemail requesting pt to call office 

## 2013-08-02 NOTE — ED Notes (Addendum)
Pt states a hx of back pain and was seen recently here for same. States she re injured it when working outside with her dogs. States it has not gotten any better at this time. Medications she's taking at home are not helping.

## 2013-08-02 NOTE — Telephone Encounter (Signed)
See Dr Lorelei Pont first unless her jury duty is for today or tomorrow

## 2013-08-02 NOTE — Discharge Instructions (Signed)
Back Pain, Adult Low back pain is very common. About 1 in 5 people have back pain.The cause of low back pain is rarely dangerous. The pain often gets better over time.About half of people with a sudden onset of back pain feel better in just 2 weeks. About 8 in 10 people feel better by 6 weeks.  CAUSES Some common causes of back pain include:  Strain of the muscles or ligaments supporting the spine.  Wear and tear (degeneration) of the spinal discs.  Arthritis.  Direct injury to the back. DIAGNOSIS Most of the time, the direct cause of low back pain is not known.However, back pain can be treated effectively even when the exact cause of the pain is unknown.Answering your caregiver's questions about your overall health and symptoms is one of the most accurate ways to make sure the cause of your pain is not dangerous. If your caregiver needs more information, he or she may order lab work or imaging tests (X-rays or MRIs).However, even if imaging tests show changes in your back, this usually does not require surgery. HOME CARE INSTRUCTIONS For many people, back pain returns.Since low back pain is rarely dangerous, it is often a condition that people can learn to [facility] their own.   Remain active. It is stressful on the back to sit or stand in one place. Do not sit, drive, or stand in one place for more than 30 minutes at a time. Take short walks on level surfaces as soon as pain allows.Try to increase the length of time you walk each day.  Do not stay in bed.Resting more than 1 or 2 days can delay your recovery.  Do not avoid exercise or work.Your body is made to move.It is not dangerous to be active, even though your back may hurt.Your back will likely heal faster if you return to being active before your pain is gone.  Pay attention to your body when you bend and lift. Many people have less discomfortwhen lifting if they bend their knees, keep the load close to their bodies,and  avoid twisting. Often, the most comfortable positions are those that put less stress on your recovering back.  Find a comfortable position to sleep. Use a firm mattress and lie on your side with your knees slightly bent. If you lie on your back, put a pillow under your knees.  Only take over-the-counter or prescription medicines as directed by your caregiver. Over-the-counter medicines to reduce pain and inflammation are often the most helpful.Your caregiver may prescribe muscle relaxant drugs.These medicines help dull your pain so you can more quickly return to your normal activities and healthy exercise.  Put ice on the injured area.  Put ice in a plastic bag.  Place a towel between your skin and the bag.  Leave the ice on for 15-20 minutes, 03-04 times a day for the first 2 to 3 days. After that, ice and heat may be alternated to reduce pain and spasms.  Ask your caregiver about trying back exercises and gentle massage. This may be of some benefit.  Avoid feeling anxious or stressed.Stress increases muscle tension and can worsen back pain.It is important to recognize when you are anxious or stressed and learn ways to manage it.Exercise is a great option. SEEK MEDICAL CARE IF:  You have pain that is not relieved with rest or medicine.  You have pain that does not improve in 1 week.  You have new symptoms.  You are generally not feeling well. SEEK  IMMEDIATE MEDICAL CARE IF:   You have pain that radiates from your back into your legs.  You develop new bowel or bladder control problems.  You have unusual weakness or numbness in your arms or legs.  You develop nausea or vomiting.  You develop abdominal pain.  You feel faint. Document Released: 05/18/2005 Document Revised: 11/17/2011 Document Reviewed: 10/06/2010 [facility] Patient Information 2014 [place], [place].   Take a Valium in the place of your lorazepam at nighttime Take any other medicines as prescribed Obtain  followup appointment with pain clinic

## 2013-08-02 NOTE — Telephone Encounter (Signed)
Pt said her pain is worse and didn't think she could wait until next week to be seen. Pt requested a sooner appt., appt scheduled with Dr. Lorelei Pont tomorrow, pt wants to know if you would still write letter for jury duty?

## 2013-08-02 NOTE — ED Provider Notes (Signed)
CSN: [id number]     Arrival date & time 08/02/13  1628 History  This chart was scribed for non-physician practitioner, Elisha Headland, FNP,working with Richarda Blade, MD, by Marlowe Kays, ED Scribe.  This patient was seen in room [place] and the patient's care was started at 6:48 PM.  Chief Complaint  Patient presents with  . Back Pain   The history is provided by the patient. No language interpreter was used.   HPI Comments:  Alexa Woods is a 53 y.o. female with h/o chronic back pain, who presents to the Emergency Department complaining of severe back pain and spasms to the lumbar region that has been worsening for the past week. She was seen here in the ED and was given pain medication prior to discharge. She states she contacted her PCP and was told she could not be seen for another week. She states she is seen at [facility] for her back pain but cannot be seen until she can pay them. She reports associated numbness and tingling down BLE. She reports the pain is all the way across the lumbar region of her back but is more localized on the right side. She states staying in the same position for long periods of time make the pain worse. She reports applying warm compress with only mild relief. Pt denies fever.    Past Medical History  Diagnosis Date  . Epigastric abdominal pain   . Alcohol abuse, unspecified   . Allergic rhinitis, cause unspecified   . Anxiety state, unspecified   . Unspecified asthma(493.90)   . Backache, unspecified   . Other chronic cystitis   . Depressive disorder, not elsewhere classified   . Type II or unspecified type diabetes mellitus without mention of complication, not stated as uncontrolled   . Dysphagia, unspecified(787.20)   . Esophageal reflux   . Unspecified hemorrhoids without mention of complication   . Herpes zoster without mention of complication   . Unspecified essential hypertension   . Hypopotassemia   . Morbid obesity   .  Other screening mammogram   . Pain in joint, lower leg   . Other psoriasis   . History of Bell's palsy 4/09  . Wrist fracture   . Edema   . Gastroparesis   . Myocardial infarction    Past Surgical History  Procedure Laterality Date  . Appendectomy    . Cholecystectomy    . Partial hysterectomy    . Knee arthroscopy w/ partial medial meniscectomy  10/2008    and chondroplasty  . Total knee arthroplasty    . Esophagogastroduodenoscopy  07/2002    erythematous gastropathy  . Shoulder surgery  05/2010  . Dobutamine stress echo  2009     normal stress echo, no evidence of ischemia  . Mri  11/2102    pt states she has a bone spur on her spine   Family History  Problem Relation Age of Onset  . Alcohol abuse Mother   . Hypertension Mother   . Esophageal cancer Maternal Grandmother    History  Substance Use Topics  . Smoking status: Former Smoker    Quit date: 12/30/2001  . Smokeless tobacco: Never Used  . Alcohol Use: No     Comment: former alcoholic    OB History   Grav Para Term Preterm Abortions TAB SAB Ect Mult Living                 Review of Systems  Constitutional: Negative for  fever and chills.  Genitourinary: Negative for dysuria.  Musculoskeletal: Positive for back pain (lower back pain). Negative for gait problem.   Allergies  Bupropion hcl; Cefuroxime axetil; Codeine; Lidocaine; Metformin; Paroxetine; Propoxyphene n-acetaminophen; Sulfonamide derivatives; and Tramadol hcl  Home Medications   Current Outpatient Rx  Name  Route  Sig  Dispense  Refill  . hydrochlorothiazide (HYDRODIURIL) 25 MG tablet   Oral   Take 1 tablet (25 mg total) by mouth daily.   30 tablet   11   . insulin aspart (NOVOLOG) 100 UNIT/ML injection   Subcutaneous   Inject 1-7 Units into the skin as directed. 1-7 units per sliding scale.         . insulin glargine (LANTUS) 100 UNIT/ML injection   Subcutaneous   Inject 45 Units into the skin at bedtime.         Marland Kitchen LORazepam  (ATIVAN) 1 MG tablet   Oral   Take 1 mg by mouth every 8 (eight) hours as needed for anxiety or sleep.          Marland Kitchen oxyCODONE-acetaminophen (PERCOCET) 10-325 MG per tablet   Oral   Take 1 tablet by mouth every 6 (six) hours as needed. For pain          . perphenazine (TRILAFON) 2 MG tablet   Oral   Take 2 mg by mouth at bedtime.         . potassium chloride (K-DUR,KLOR-CON) 10 MEQ tablet   Oral   Take 10 mEq by mouth daily.         . sertraline (ZOLOFT) 100 MG tablet   Oral   Take 200 mg by mouth daily.          Marland Kitchen tiZANidine (ZANAFLEX) 4 MG tablet   Oral   Take 4 mg by mouth every 8 (eight) hours as needed. For muscle spasm          . zolpidem (AMBIEN) 10 MG tablet   Oral   Take 15 mg by mouth at bedtime as needed for sleep.          Marland Kitchen albuterol (PROVENTIL HFA;VENTOLIN HFA) 108 (90 BASE) MCG/ACT inhaler   Inhalation   Inhale 2 puffs into the lungs every 4 (four) hours as needed for wheezing or shortness of breath.         Marland Kitchen omeprazole (PRILOSEC) 20 MG capsule   Oral   Take 2 capsules (40 mg total) by mouth daily.   60 capsule   3    Triage Vitals: BP 92/63  Pulse 85  Temp(Src) 97.9 F (36.6 C) (Oral)  SpO2 94% Physical Exam  Nursing note and vitals reviewed. Constitutional: She is oriented to person, place, and time. She appears well-developed and well-nourished.  HENT:  Head: Normocephalic and atraumatic.  Eyes: EOM are normal.  Neck: Normal range of motion.  Cardiovascular: Normal rate.   Pulmonary/Chest: Effort normal.  Musculoskeletal: Normal range of motion. She exhibits tenderness. She exhibits no edema.  Right lumbar paravertebral tenderness and spasm.  Neurological: She is alert and oriented to person, place, and time.  Skin: Skin is warm and dry.  Psychiatric: She has a normal mood and affect. Her behavior is normal.    ED Course  Procedures (including critical care time) DIAGNOSTIC STUDIES: Oxygen Saturation is 94% on RA, adequate  by my interpretation.   COORDINATION OF CARE: 6:57 PM- Will give Valium and Dilaudid prior to discharge. Advised pt to follow up with Cristino Martes or PCP  for further treatment. Pt verbalizes understanding and agrees to plan.  Medications  HYDROmorphone (DILAUDID) injection 1 mg (1 mg Intramuscular Given 08/02/13 1905)  diazepam (VALIUM) tablet 5 mg (5 mg Oral Given 08/02/13 1906)    Labs Review Labs Reviewed - No data to display Imaging Review Dg Lumbar Spine Complete  07/31/2013   CLINICAL DATA:  Back pain.  EXAM: LUMBAR SPINE - COMPLETE 4+ VIEW  COMPARISON:  CT 02/06/2011  FINDINGS: Vertebral body alignment, heights and disc space heights are within normal. There is mild spondylosis present. There is no compression fracture or subluxation. There is mild facet arthropathy.  IMPRESSION: No acute findings.  Mild spondylosis to include facet arthropathy.   Electronically Signed   By: Marin Olp M.D.   On: 07/31/2013 19:50     EKG Interpretation None      MDM   Final diagnoses:  Back pain, chronic    Pt has chronic back pain and is followed by Ortho and her PCP. Dilaudid Im given here with valium for spasm with relief. Home with prescription for valium. No narcotics prescribed. She has percocet at home that she takes. Follow-up with PCP.  I personally performed the services described in this documentation, which was scribed in my presence. The recorded information has been reviewed and is accurate.    Elisha Headland, NP 08/09/13 [phone]

## 2013-08-03 ENCOUNTER — Other Ambulatory Visit: Payer: Self-pay | Admitting: *Deleted

## 2013-08-03 ENCOUNTER — Ambulatory Visit: Payer: Managed Care, Other (non HMO) | Admitting: Family Medicine

## 2013-08-03 ENCOUNTER — Other Ambulatory Visit: Payer: Self-pay | Admitting: Endocrinology

## 2013-08-03 DIAGNOSIS — Z0289 Encounter for other administrative examinations: Secondary | ICD-10-CM

## 2013-08-03 MED ORDER — INSULIN PEN NEEDLE 31G X 5 MM MISC: Status: DC

## 2013-08-08 ENCOUNTER — Encounter: Payer: Self-pay | Admitting: Family Medicine

## 2013-08-08 ENCOUNTER — Ambulatory Visit (INDEPENDENT_AMBULATORY_CARE_PROVIDER_SITE_OTHER): Payer: Managed Care, Other (non HMO) | Admitting: Family Medicine

## 2013-08-08 ENCOUNTER — Telehealth: Payer: Self-pay | Admitting: *Deleted

## 2013-08-08 VITALS — BP 128/86 | HR 82 | Temp 98.6°F | Ht 67.0 in | Wt 252.2 lb

## 2013-08-08 DIAGNOSIS — M549 Dorsalgia, unspecified: Secondary | ICD-10-CM

## 2013-08-08 MED ORDER — PREDNISONE 10 MG PO TABS: ORAL_TABLET | ORAL | Status: DC

## 2013-08-08 NOTE — Telephone Encounter (Signed)
Pt called back after her appt. today. You advised pt to stop the diazepam, pt is worried about stopping it because she said it's helping her pain and anxiety. Pt said she has tried lorazepam in the past and it didn't help as good as the diazepam. Pt wanted to know if she can stay on med please advise

## 2013-08-08 NOTE — Progress Notes (Signed)
Pre visit review using our clinic review tool, if applicable. No additional management support is needed unless otherwise documented below in the visit note. 

## 2013-08-08 NOTE — Progress Notes (Signed)
Subjective:    Patient ID: Alexa Woods, female    DOB: 1961-02-14, 53 y.o.   MRN: [id number]  HPI Here with acute on chronic back pain   Per pt - hx of bulging/deteriorating discs times 2  Last MRI was in 2012  [facility]- see Dr Vincenza Hews cannot return there because she cannot pay  Never had back surgery Has never had an injection   One time her back got this bad and she spent 2 wk in bed - a few years ago   Can't pay her bills   Needs a note to get out of jury duty April the 6th    Last week - when she bent down with her dog her low back became acutely painful with radiation down to her legs  Spasms off and on all day Worse when sitting  Lying down is not as bad - with pillow under knees  R leg is worse  Whole back hurts   Using heat  No ice   Latest ER visit - did XR and spondylosis and some facet arthropathy   Patient Active Problem List   Diagnosis Date Noted  . Abdominal  pain, other specified site 03/16/2013  . Paresthesia of skin 02/08/2013  . Rash of face 02/08/2013  . Tick bite of back 02/03/2013  . Periodontal disease 02/03/2013  . Hyperlipidemia, mild 09/27/2012  . Unspecified gastritis and gastroduodenitis without mention of hemorrhage 08/02/2012  . Rash of hands 05/23/2012  . Asthma with acute exacerbation 05/23/2012  . 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 Lung density on x-ray 02/23/2011  . Thrombocytopenia 02/20/2011  . DYSPHAGIA UNSPECIFIED 08/07/2010  . BACK PAIN 07/22/2010  . MORBID OBESITY 03/30/2008  . 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  . DIABETES MELLITUS, TYPE II 11/30/2006  . ANXIETY 11/30/2006  . History of alcohol abuse 11/30/2006  . DEPRESSION 11/30/2006  . HYPERTENSION 11/30/2006  . HEMORRHOIDS 11/30/2006  . ALLERGIC RHINITIS 11/30/2006  . ASTHMA 11/30/2006  . GERD 11/30/2006  . CYSTITIS, CHRONIC 11/30/2006  . PSORIASIS 11/30/2006   Past Medical History  Diagnosis Date  . Epigastric abdominal pain   .  Alcohol abuse, unspecified   . Allergic rhinitis, cause unspecified   . Anxiety state, unspecified   . Unspecified asthma(493.90)   . Backache, unspecified   . Other chronic cystitis   . Depressive disorder, not elsewhere classified   . Type II or unspecified type diabetes mellitus without mention of complication, not stated as uncontrolled   . Dysphagia, unspecified(787.20)   . Esophageal reflux   . Unspecified hemorrhoids without mention of complication   . Herpes zoster without mention of complication   . Unspecified essential hypertension   . Hypopotassemia   . Morbid obesity   . Other screening mammogram   . Pain in joint, lower leg   . Other psoriasis   . History of Bell's palsy 4/09  . Wrist fracture   . Edema   . Gastroparesis   . Myocardial infarction    Past Surgical History  Procedure Laterality Date  . Appendectomy    . Cholecystectomy    . Partial hysterectomy    . Knee arthroscopy w/ partial medial meniscectomy  10/2008    and chondroplasty  . Total knee arthroplasty    . Esophagogastroduodenoscopy  07/2002    erythematous gastropathy  . Shoulder surgery  05/2010  . Dobutamine stress echo  2009     normal stress echo, no evidence of ischemia  .  Mri  11/2102    pt states she has a bone spur on her spine   History  Substance Use Topics  . Smoking status: Former Smoker    Quit date: 12/30/2001  . Smokeless tobacco: Never Used  . Alcohol Use: No     Comment: former alcoholic    Family History  Problem Relation Age of Onset  . Alcohol abuse Mother   . Hypertension Mother   . Esophageal cancer Maternal Grandmother    Allergies  Allergen Reactions  . Bupropion Hcl   . Cefuroxime Axetil Nausea Only  . Codeine     REACTION: nausea and vomiting, rash  . Lidocaine     REACTION: unknown  . Metformin     REACTION: GI  . Paroxetine     REACTION: doesn't agree  . Propoxyphene N-Acetaminophen     REACTION: wheezing  . Sulfonamide Derivatives      REACTION: rash  . Tramadol Hcl     REACTION: Causes Anxiety   Current Outpatient Prescriptions on File Prior to Visit  Medication Sig Dispense Refill  . albuterol (PROVENTIL HFA;VENTOLIN HFA) 108 (90 BASE) MCG/ACT inhaler Inhale 2 puffs into the lungs every 4 (four) hours as needed for wheezing or shortness of breath.      . diazepam (VALIUM) 5 MG tablet Take 1 tablet (5 mg total) by mouth every 8 (eight) hours as needed for anxiety or muscle spasms.  30 tablet  0  . hydrochlorothiazide (HYDRODIURIL) 25 MG tablet Take 1 tablet (25 mg total) by mouth daily.  30 tablet  11  . insulin aspart (NOVOLOG) 100 UNIT/ML injection Inject 1-7 Units into the skin as directed. 1-7 units per sliding scale.      . insulin glargine (LANTUS) 100 UNIT/ML injection Inject 45 Units into the skin at bedtime.      . Insulin Pen Needle (LEADER UNIFINE PENTIPS PLUS) 31G X 5 MM MISC Use twice daily as instructed.  100 each  2  . LORazepam (ATIVAN) 1 MG tablet Take 1 mg by mouth every 8 (eight) hours as needed for anxiety or sleep.       Marland Kitchen omeprazole (PRILOSEC) 20 MG capsule Take 2 capsules (40 mg total) by mouth daily.  60 capsule  3  . oxyCODONE-acetaminophen (PERCOCET) 10-325 MG per tablet Take 1 tablet by mouth every 6 (six) hours as needed. For pain       . perphenazine (TRILAFON) 2 MG tablet Take 2 mg by mouth at bedtime.      . potassium chloride (K-DUR,KLOR-CON) 10 MEQ tablet Take 10 mEq by mouth daily.      . sertraline (ZOLOFT) 100 MG tablet Take 200 mg by mouth daily.       Marland Kitchen tiZANidine (ZANAFLEX) 4 MG tablet Take 4 mg by mouth every 8 (eight) hours as needed. For muscle spasm       . zolpidem (AMBIEN) 10 MG tablet Take 15 mg by mouth at bedtime as needed for sleep.       . [DISCONTINUED] potassium chloride (KLOR-CON) 10 MEQ CR tablet Take 1 tablet (10 mEq total) by mouth daily.  60 tablet  6   No current facility-administered medications on file prior to visit.     Review of Systems Review of Systems    Constitutional: Negative for fever, appetite change, fatigue and unexpected weight change.  Eyes: Negative for pain and visual disturbance.  Respiratory: Negative for cough and shortness of breath.   Cardiovascular: Negative for cp or  palpitations    Gastrointestinal: Negative for nausea, diarrhea and constipation.  Genitourinary: Negative for urgency and frequency. neg for dysuria/hematuria or flank pain  Skin: Negative for pallor or rash   Neurological: Negative for weakness, light-headedness, numbness and headaches.  Hematological: Negative for adenopathy. Does not bruise/bleed easily.  Psychiatric/Behavioral: Negative for dysphoric mood. The patient is not nervous/anxious.         Objective:   Physical Exam  Constitutional: She appears well-developed and well-nourished. No distress.  Pt is in pain- tearful at times Obese   HENT:  Head: Normocephalic and atraumatic.  Mouth/Throat: Oropharynx is clear and moist.  Eyes: Conjunctivae and EOM are normal. Pupils are equal, round, and reactive to light.  Neck: Normal range of motion. Neck supple.  Cardiovascular: Normal rate and regular rhythm.   Pulmonary/Chest: Effort normal and breath sounds normal.  Musculoskeletal:       Lumbar back: She exhibits decreased range of motion, tenderness, bony tenderness, pain and spasm. She exhibits no swelling, no edema and no deformity.  LS- flex 30 deg with pain  Ext-unable to due to pain  Labored gait-no foot drop noted but pt shuffles due to pain  Lat flex-unable due to pain  Bent knee raise causes back pain on L and back and leg pain on R    Lymphadenopathy:    She has no cervical adenopathy.  Neurological: She is alert. She displays no atrophy. No sensory deficit. She exhibits normal muscle tone.  4/5 strength R great toe dorsiflexion-nl strength in all other areas   DTRs are all symmetric  Skin: Skin is warm and dry. No rash noted. No erythema. No pallor.  Psychiatric: Her mood  appears anxious. She is not slowed.  Tearful in pain           Assessment & Plan:

## 2013-08-08 NOTE — Telephone Encounter (Signed)
Left detailed message on voicemail.  

## 2013-08-08 NOTE — Patient Instructions (Signed)
For back pain - take your tizanidine, your percocet and take the prednisone as directed  Stop at check out for referral for your MRI  Here is a letter for jury duty

## 2013-08-08 NOTE — Telephone Encounter (Signed)
I want her to stay with the lorazepam

## 2013-08-08 NOTE — Assessment & Plan Note (Signed)
Hx of deg disc dz / saw Dr Nelva Bush but cannot return due to inability to pay currently 2 ER  Visits in 2 weeks due to acute pain after bending Tizanidine for spasm (this works better than diazepam for pt)  Percocet for pain -pt takes this max bid  Did begin a low dose (20 mg) pred course with taper to 10-pt aware sugar will rise (she cannot tolerate nsaids from GI reason) Rev XR Ref for MRI  Will go from there-disc opt for tx incl PT or pain clinic

## 2013-08-09 ENCOUNTER — Ambulatory Visit
Admission: RE | Admit: 2013-08-09 | Discharge: 2013-08-09 | Disposition: A | Discharge status: Home / Self Care | Payer: Managed Care, Other (non HMO) | Source: Ambulatory Visit | Attending: Family Medicine | Admitting: Family Medicine

## 2013-08-09 DIAGNOSIS — M549 Dorsalgia, unspecified: Secondary | ICD-10-CM

## 2013-08-09 NOTE — ED Provider Notes (Signed)
Medical screening examination/treatment/procedure(s) were performed by non-physician practitioner and as supervising physician I was immediately available for consultation/collaboration.  Richarda Blade, MD 08/09/13 [phone]

## 2013-08-31 ENCOUNTER — Other Ambulatory Visit: Payer: Self-pay | Admitting: Family Medicine

## 2013-09-28 ENCOUNTER — Other Ambulatory Visit: Payer: Self-pay | Admitting: Neurological Surgery

## 2013-09-28 DIAGNOSIS — M542 Cervicalgia: Secondary | ICD-10-CM

## 2013-10-05 ENCOUNTER — Ambulatory Visit
Admission: RE | Admit: 2013-10-05 | Discharge: 2013-10-05 | Disposition: A | Discharge status: Home / Self Care | Payer: Managed Care, Other (non HMO) | Source: Ambulatory Visit | Attending: Neurological Surgery | Admitting: Neurological Surgery

## 2013-10-05 DIAGNOSIS — M542 Cervicalgia: Secondary | ICD-10-CM

## 2013-10-17 ENCOUNTER — Ambulatory Visit (INDEPENDENT_AMBULATORY_CARE_PROVIDER_SITE_OTHER): Payer: Managed Care, Other (non HMO) | Admitting: Family Medicine

## 2013-10-17 ENCOUNTER — Encounter: Payer: Self-pay | Admitting: Family Medicine

## 2013-10-17 VITALS — BP 118/80 | HR 76 | Temp 98.2°F | Ht 67.0 in | Wt 250.2 lb

## 2013-10-17 DIAGNOSIS — B9689 Other specified bacterial agents as the cause of diseases classified elsewhere: Secondary | ICD-10-CM

## 2013-10-17 DIAGNOSIS — J019 Acute sinusitis, unspecified: Secondary | ICD-10-CM

## 2013-10-17 MED ORDER — AMOXICILLIN-POT CLAVULANATE 875-125 MG PO TABS: 1.0000 | ORAL_TABLET | Freq: Two times a day (BID) | ORAL | Status: DC

## 2013-10-17 NOTE — Assessment & Plan Note (Signed)
In diabetic s/p uri  Cover with augmentin  Disc symptomatic care - see instructions on AVS  Update if not starting to improve in a week or if worsening  -esp if inc wheeze

## 2013-10-17 NOTE — Progress Notes (Signed)
Pre visit review using our clinic review tool, if applicable. No additional management support is needed unless otherwise documented below in the visit note. 

## 2013-10-17 NOTE — Progress Notes (Signed)
Subjective:    Patient ID: Alexa Woods, female    DOB: 1960/07/22, 53 y.o.   MRN: [id number]  HPI Here with uri symptoms  Cong/runny nose / st N/v from post nasal drip  Felt feverish-chills/sweats and aches - does not have a thermometer  Ribs are aching   Eyes itch and burn  Sinus pain and pressure in face  Dark green mucous - prod cough and nasal   Some wheeze   Son has this also   Sick since the weekend   Patient Active Problem List   Diagnosis Date Noted  . Abdominal  pain, other specified site 03/16/2013  . Paresthesia of skin 02/08/2013  . Rash of face 02/08/2013  . Tick bite of back 02/03/2013  . Periodontal disease 02/03/2013  . Hyperlipidemia, mild 09/27/2012  . Unspecified gastritis and gastroduodenitis without mention of hemorrhage 08/02/2012  . Rash of hands 05/23/2012  . Asthma with acute exacerbation 05/23/2012  . 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 Lung density on x-ray 02/23/2011  . Thrombocytopenia 02/20/2011  . DYSPHAGIA UNSPECIFIED 08/07/2010  . Low back pain 07/22/2010  . MORBID OBESITY 03/30/2008  . 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  . DIABETES MELLITUS, TYPE II 11/30/2006  . ANXIETY 11/30/2006  . History of alcohol abuse 11/30/2006  . DEPRESSION 11/30/2006  . HYPERTENSION 11/30/2006  . HEMORRHOIDS 11/30/2006  . ALLERGIC RHINITIS 11/30/2006  . ASTHMA 11/30/2006  . GERD 11/30/2006  . CYSTITIS, CHRONIC 11/30/2006  . PSORIASIS 11/30/2006   Past Medical History  Diagnosis Date  . Epigastric abdominal pain   . Alcohol abuse, unspecified   . Allergic rhinitis, cause unspecified   . Anxiety state, unspecified   . Unspecified asthma(493.90)   . Backache, unspecified   . Other chronic cystitis   . Depressive disorder, not elsewhere classified   . Type II or unspecified type diabetes mellitus without mention of complication, not stated as uncontrolled   . Dysphagia, unspecified(787.20)   . Esophageal reflux   .  Unspecified hemorrhoids without mention of complication   . Herpes zoster without mention of complication   . Unspecified essential hypertension   . Hypopotassemia   . Morbid obesity   . Other screening mammogram   . Pain in joint, lower leg   . Other psoriasis   . History of Bell's palsy 4/09  . Wrist fracture   . Edema   . Gastroparesis   . Myocardial infarction    Past Surgical History  Procedure Laterality Date  . Appendectomy    . Cholecystectomy    . Partial hysterectomy    . Knee arthroscopy w/ partial medial meniscectomy  10/2008    and chondroplasty  . Total knee arthroplasty    . Esophagogastroduodenoscopy  07/2002    erythematous gastropathy  . Shoulder surgery  05/2010  . Dobutamine stress echo  2009     normal stress echo, no evidence of ischemia  . Mri  11/2102    pt states she has a bone spur on her spine   History  Substance Use Topics  . Smoking status: Former Smoker    Quit date: 12/30/2001  . Smokeless tobacco: Never Used  . Alcohol Use: No     Comment: former alcoholic    Family History  Problem Relation Age of Onset  . Alcohol abuse Mother   . Hypertension Mother   . Esophageal cancer Maternal Grandmother    Allergies  Allergen Reactions  . Bupropion Hcl   . Cefuroxime Axetil  Nausea Only  . Codeine     REACTION: nausea and vomiting, rash  . Lidocaine     REACTION: unknown  . Metformin     REACTION: GI  . Paroxetine     REACTION: doesn't agree  . Propoxyphene N-Acetaminophen     REACTION: wheezing  . Sulfonamide Derivatives     REACTION: rash  . Tramadol Hcl     REACTION: Causes Anxiety   Current Outpatient Prescriptions on File Prior to Visit  Medication Sig Dispense Refill  . albuterol (PROVENTIL HFA;VENTOLIN HFA) 108 (90 BASE) MCG/ACT inhaler Inhale 2 puffs into the lungs every 4 (four) hours as needed for wheezing or shortness of breath.      . hydrochlorothiazide (HYDRODIURIL) 25 MG tablet Take 1 tablet (25 mg total) by mouth  daily.  30 tablet  11  . insulin aspart (NOVOLOG) 100 UNIT/ML injection Inject 1-7 Units into the skin as directed. 1-7 units per sliding scale.      . insulin glargine (LANTUS) 100 UNIT/ML injection Inject 45 Units into the skin at bedtime.      . Insulin Pen Needle (LEADER UNIFINE PENTIPS PLUS) 31G X 5 MM MISC Use twice daily as instructed.  100 each  2  . LORazepam (ATIVAN) 1 MG tablet Take 1 mg by mouth every 8 (eight) hours as needed for anxiety or sleep.       Marland Kitchen omeprazole (PRILOSEC) 20 MG capsule Take 2 capsules (40 mg total) by mouth daily.  60 capsule  3  . oxyCODONE-acetaminophen (PERCOCET) 10-325 MG per tablet Take 1 tablet by mouth every 6 (six) hours as needed. For pain       . perphenazine (TRILAFON) 2 MG tablet Take 2 mg by mouth at bedtime.      . potassium chloride (K-DUR,KLOR-CON) 10 MEQ tablet TAKE 1 TABLET BY MOUTH DAILY  30 tablet  5  . predniSONE (DELTASONE) 10 MG tablet Take 2 pills once daily by mouth for 5 days then 1 pill by mouth for 3 days then stop  13 tablet  0  . sertraline (ZOLOFT) 100 MG tablet Take 200 mg by mouth daily.       Marland Kitchen tiZANidine (ZANAFLEX) 4 MG tablet Take 4 mg by mouth every 8 (eight) hours as needed. For muscle spasm       . zolpidem (AMBIEN) 10 MG tablet Take 15 mg by mouth at bedtime as needed for sleep.       . [DISCONTINUED] potassium chloride (KLOR-CON) 10 MEQ CR tablet Take 1 tablet (10 mEq total) by mouth daily.  60 tablet  6   No current facility-administered medications on file prior to visit.     Review of Systems Review of Systems  Constitutional: Negative for , appetite change, and unexpected weight change. pos for fatigue/malaise ENT pos for congestion / rhinorrhea / st/ facial sinus pain  Eyes: Negative for pain and visual disturbance.  Respiratory: Negative for shortness of breath.  pos for cough and wheezing  Cardiovascular: Negative for cp or palpitations    Gastrointestinal: Negative for nausea, diarrhea and constipation.    Genitourinary: Negative for urgency and frequency.  Skin: Negative for pallor or rash   Neurological: Negative for weakness, light-headedness, numbness and headaches.  Hematological: Negative for adenopathy. Does not bruise/bleed easily.  Psychiatric/Behavioral: Negative for dysphoric mood. The patient is not nervous/anxious.         Objective:   Physical Exam  Constitutional: She appears well-developed and well-nourished. No distress.  obese  and well appearing   HENT:  Head: Normocephalic and atraumatic.  Right Ear: External ear normal.  Left Ear: External ear normal.  Mouth/Throat: Oropharynx is clear and moist. No oropharyngeal exudate.  Nares are injected and congested  Bilateral maxillary and frontal sinus tenderness Throat clear    Eyes: EOM are normal. Pupils are equal, round, and reactive to light. Right eye exhibits no discharge. Left eye exhibits no discharge.  Mild conj injection   Neck: Normal range of motion. Neck supple.  Cardiovascular: Normal rate and regular rhythm.   Pulmonary/Chest: Effort normal and breath sounds normal. No respiratory distress. She has no wheezes. She has no rales.  Harsh bs   Lymphadenopathy:    She has no cervical adenopathy.  Neurological: She is alert.  Skin: Skin is warm and dry. No rash noted.  Psychiatric: She has a normal mood and affect.          Assessment & Plan:

## 2013-10-17 NOTE — Patient Instructions (Signed)
Drink lots of fluids  Try saline nasal spray for congestion  Take the augmentin for sinus infection  Update if not starting to improve in a week or if worsening

## 2013-10-30 ENCOUNTER — Other Ambulatory Visit: Payer: Self-pay | Admitting: Family Medicine

## 2013-11-07 ENCOUNTER — Other Ambulatory Visit: Payer: Self-pay

## 2013-11-07 DIAGNOSIS — R109 Unspecified abdominal pain: Secondary | ICD-10-CM

## 2013-11-07 NOTE — Telephone Encounter (Signed)
Pt has changed ins to [place] and needs refills to go to [facility] delivery mail order pharmacy. Pt request lantus pen taking 45 units at hs and novolog pen taking 1-7 units as directed per sliding scale. Pt has never seen Dr Cruzita Lederer. Pt also request omeprazole 20 mg takes one capsule daily; on med list omeprazole 20 mg taking 2 caps daily. Pt also request albuterol inhaler and pt has not seen pulmonologist yet. Pt said has been over year since saw Dr Glori Bickers unless sick.Please advise.

## 2013-11-07 NOTE — Telephone Encounter (Signed)
Please verify omeprazole dosing with her  Refill all meds but DM meds for 3 mo and f/u with me this summer Please get her appt with Dr Cruzita Lederer - she est with her in Oct and I do not see a f/u visit  thanks

## 2013-11-09 ENCOUNTER — Telehealth: Payer: Self-pay | Admitting: Internal Medicine

## 2013-11-09 ENCOUNTER — Other Ambulatory Visit: Payer: Self-pay | Admitting: *Deleted

## 2013-11-09 MED ORDER — HYDROCHLOROTHIAZIDE 25 MG PO TABS: ORAL_TABLET | ORAL | Status: DC

## 2013-11-09 MED ORDER — OMEPRAZOLE 20 MG PO CPDR: 20.0000 mg | DELAYED_RELEASE_CAPSULE | Freq: Every day | ORAL | Status: DC

## 2013-11-09 MED ORDER — INSULIN PEN NEEDLE 31G X 5 MM MISC: Status: DC

## 2013-11-09 MED ORDER — INSULIN GLARGINE 100 UNIT/ML ~~LOC~~ SOLN: 45.0000 [IU] | Freq: Every day | SUBCUTANEOUS | Status: DC

## 2013-11-09 MED ORDER — GLUCOSE BLOOD VI STRP: ORAL_STRIP | Status: DC

## 2013-11-09 MED ORDER — ONETOUCH ULTRASOFT LANCETS MISC: Status: DC

## 2013-11-09 MED ORDER — POTASSIUM CHLORIDE CRYS ER 10 MEQ PO TBCR: EXTENDED_RELEASE_TABLET | ORAL | Status: DC

## 2013-11-09 MED ORDER — ALBUTEROL SULFATE HFA 108 (90 BASE) MCG/ACT IN AERS: 2.0000 | INHALATION_SPRAY | RESPIRATORY_TRACT | Status: DC | PRN

## 2013-11-09 NOTE — Telephone Encounter (Signed)
Pt called back stating the meter she uses is One Touch Ultra.

## 2013-11-09 NOTE — Telephone Encounter (Signed)
appt scheduled with Dr. Glori Bickers on 12/20/13, pt said she does take omeprazole once daily (Rx changed), and Rxs refilled x3 month supply.  I gave pt Dr. Burnett Corrente phone # to her office so she can call them and get a f/u and request diabetic medications be sent to her

## 2013-11-09 NOTE — Telephone Encounter (Signed)
Patient called in stating that she needs a refill on her medications  Last visit was October 2014  She needs   Lantus Pen needles Strips and lancets   [facility]  Thank you :)

## 2013-11-09 NOTE — Telephone Encounter (Signed)
Refilled lantus for 1 month, refilled pen needles (note to pharmacist, must see dr Cruzita Lederer for further refills). Called and lvm to return call, to let us know what type of meter she has so that we can refill her strips and lancets.

## 2013-11-22 ENCOUNTER — Telehealth: Payer: Self-pay | Admitting: Family Medicine

## 2013-11-22 NOTE — Telephone Encounter (Signed)
Patient Information:  Caller Name: Briona  Phone: [phone]  Patient: Kameryn, Davern  Gender: Female  DOB: May 30, 1961  Age: 53 Years  PCP: [facility])  Pregnant: No  Office Follow Up:  Does the office need to follow up with this patient?: No  Instructions For The Office: N/A  RN Note:  Patient calling regarding reoccurring headache, dizziness and facial pain, occasional heart palpitations (none noted now).  States "This has been going on for 3 weeks in the evenings only."  Symptoms  Reason For Call & Symptoms: dizziness, headaches, facial pain,  Reviewed Health History In EMR: Yes  Reviewed Medications In EMR: Yes  Reviewed Allergies In EMR: Yes  Reviewed Surgeries / Procedures: Yes  Date of Onset of Symptoms: 11/01/2013 OB / GYN:  LMP: Unknown  Guideline(s) Used:  Headache  Disposition Per Guideline:   See Today or Tomorrow in Office  Reason For Disposition Reached:   Unexplained headache that is present > 24 hours  Advice Given:  Rest:   Lie down in a dark, quiet place and try to relax. Close your eyes and imagine your entire body relaxing.  Apply Cold to the Area:   Apply a cold wet washcloth or cold pack to the forehead for 20 minutes.  Patient Will Follow Care Advice:  YES  Appointment Scheduled:  11/23/2013 12:30:00 Appointment Scheduled Provider:  Other

## 2013-11-23 ENCOUNTER — Ambulatory Visit (INDEPENDENT_AMBULATORY_CARE_PROVIDER_SITE_OTHER): Payer: Managed Care, Other (non HMO) | Admitting: Family Medicine

## 2013-11-23 ENCOUNTER — Encounter: Payer: Self-pay | Admitting: Family Medicine

## 2013-11-23 VITALS — BP 124/74 | HR 93 | Temp 98.2°F | Wt 250.0 lb

## 2013-11-23 DIAGNOSIS — J019 Acute sinusitis, unspecified: Secondary | ICD-10-CM

## 2013-11-23 DIAGNOSIS — B9689 Other specified bacterial agents as the cause of diseases classified elsewhere: Secondary | ICD-10-CM

## 2013-11-23 MED ORDER — AMOXICILLIN-POT CLAVULANATE 875-125 MG PO TABS: 1.0000 | ORAL_TABLET | Freq: Two times a day (BID) | ORAL | Status: DC

## 2013-11-23 NOTE — Progress Notes (Signed)
Pre visit review using our clinic review tool, if applicable. No additional management support is needed unless otherwise documented below in the visit note.  "Dizzy" and frontal HA.  The room doesn't spin.  She does get lightheaded.  Worse on standing, going on for about 3 weeks.  Not happening laying down, or with rolling over.  No vision loss.   No FCNAV.  Some sneezing.  Prev treated for sinusitis, improved but didn't resolve.  Still with some facial pain.     Sugar has been up and down.  Had gotten bridge supply of insulin until her longterm rx comes in.  150 this AM, 124 yesterday.    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  H/o nonacute L leg weakness- she hadn't mentioned it to ortho- encouraged her to do so.    Meds, vitals, and allergies reviewed.   ROS: See HPI.  Otherwise, noncontributory.  GEN: nad, alert and oriented HEENT: mucous membranes moist, tm w/o erythema, nasal exam w/o erythema, clear discharge noted,  OP with cobblestoning NECK: supple w/o LA CV: rrr.   PULM: ctab, no inc wob EXT: no edema SKIN: no acute rash Max and frontal sinuses ttp.

## 2013-11-23 NOTE — Patient Instructions (Addendum)
Call ortho about your leg. Start the antibiotics today and if you have more lightheadedness, then cut the HCTZ in half.  Update Dr. Glori Bickers next week.

## 2013-11-24 ENCOUNTER — Telehealth: Payer: Self-pay

## 2013-11-24 DIAGNOSIS — M79604 Pain in right leg: Secondary | ICD-10-CM

## 2013-11-24 DIAGNOSIS — M79605 Pain in left leg: Principal | ICD-10-CM

## 2013-11-24 NOTE — Telephone Encounter (Signed)
I will defer to Dr. Glori Bickers on this. Thanks.

## 2013-11-24 NOTE — Telephone Encounter (Signed)
Pt spoke with orthopedic doctor and was advised to contact PCP about possible referral to vascular surgeon for problem with lt leg; pain and weakness for last few months but back pain for years. Pt said lt leg has numbness and weakness that is worsening. Pt said discussed back and leg problem with Dr Glori Bickers 08/08/13. Pt request referral rather than another appt with Dr Glori Bickers.Please advise.

## 2013-11-24 NOTE — Telephone Encounter (Signed)
For this we would order ABI study to check perfusion and blood pressure in her legs  Which leg is worse? Is she agreeable to this plan ?

## 2013-11-24 NOTE — Assessment & Plan Note (Signed)
Restart augmentin. She has some likely orthostatic sx in the meantime.  Can cut HCTZ to 12.5mg  a day in the meantime.  F/u prn.  Nontoxic. She'll f/u with ortho as instructed.

## 2013-11-27 NOTE — Telephone Encounter (Signed)
Pt agrees with ABI study, I advise her that Rosaria Ferries will call her back to schedule appt. Pt said both legs are bothering her but the left leg is worse and that is where most of her pain is, please put referral in

## 2013-11-28 DIAGNOSIS — M79604 Pain in right leg: Secondary | ICD-10-CM | POA: Insufficient documentation

## 2013-11-28 DIAGNOSIS — M79605 Pain in left leg: Principal | ICD-10-CM | POA: Insufficient documentation

## 2013-11-28 NOTE — Telephone Encounter (Signed)
Order done

## 2013-11-29 ENCOUNTER — Other Ambulatory Visit (HOSPITAL_COMMUNITY): Payer: Self-pay | Admitting: *Deleted

## 2013-11-29 DIAGNOSIS — I739 Peripheral vascular disease, unspecified: Secondary | ICD-10-CM

## 2013-11-30 ENCOUNTER — Encounter (INDEPENDENT_AMBULATORY_CARE_PROVIDER_SITE_OTHER): Payer: Managed Care, Other (non HMO)

## 2013-11-30 DIAGNOSIS — I70229 Atherosclerosis of native arteries of extremities with rest pain, unspecified extremity: Secondary | ICD-10-CM

## 2013-11-30 DIAGNOSIS — I739 Peripheral vascular disease, unspecified: Secondary | ICD-10-CM

## 2013-12-08 ENCOUNTER — Other Ambulatory Visit: Payer: Self-pay | Admitting: *Deleted

## 2013-12-08 ENCOUNTER — Other Ambulatory Visit: Payer: Self-pay | Admitting: Family Medicine

## 2013-12-08 MED ORDER — INSULIN GLARGINE 100 UNIT/ML ~~LOC~~ SOLN: 45.0000 [IU] | Freq: Every day | SUBCUTANEOUS | Status: DC

## 2013-12-08 NOTE — Telephone Encounter (Signed)
Pt called to ck on status of insulin refill; advised pt [facility] to contact Dr Cruzita Lederer for refill. Pt voiced understanding and will contact [facility].

## 2013-12-13 ENCOUNTER — Ambulatory Visit (INDEPENDENT_AMBULATORY_CARE_PROVIDER_SITE_OTHER)
Admission: RE | Admit: 2013-12-13 | Discharge: 2013-12-13 | Disposition: A | Discharge status: Home / Self Care | Payer: Managed Care, Other (non HMO) | Source: Ambulatory Visit | Attending: Family Medicine | Admitting: Family Medicine

## 2013-12-13 ENCOUNTER — Encounter: Payer: Self-pay | Admitting: Family Medicine

## 2013-12-13 ENCOUNTER — Ambulatory Visit: Payer: Managed Care, Other (non HMO) | Admitting: Family Medicine

## 2013-12-13 ENCOUNTER — Ambulatory Visit (INDEPENDENT_AMBULATORY_CARE_PROVIDER_SITE_OTHER): Payer: Managed Care, Other (non HMO) | Admitting: Family Medicine

## 2013-12-13 VITALS — BP 90/64 | HR 82 | Temp 98.1°F | Ht 67.0 in | Wt 251.2 lb

## 2013-12-13 DIAGNOSIS — S6991XA Unspecified injury of right wrist, hand and finger(s), initial encounter: Secondary | ICD-10-CM

## 2013-12-13 DIAGNOSIS — S59919A Unspecified injury of unspecified forearm, initial encounter: Secondary | ICD-10-CM

## 2013-12-13 DIAGNOSIS — S6990XA Unspecified injury of unspecified wrist, hand and finger(s), initial encounter: Secondary | ICD-10-CM

## 2013-12-13 DIAGNOSIS — M25331 Other instability, right wrist: Secondary | ICD-10-CM

## 2013-12-13 DIAGNOSIS — IMO0002 Reserved for concepts with insufficient information to code with codable children: Secondary | ICD-10-CM

## 2013-12-13 DIAGNOSIS — S59909A Unspecified injury of unspecified elbow, initial encounter: Secondary | ICD-10-CM
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
## 2013-12-18 ENCOUNTER — Telehealth: Payer: Self-pay | Admitting: Family Medicine

## 2013-12-18 NOTE — Telephone Encounter (Signed)
Patient Information:  Caller Name: Sunita  Phone: [phone]  [name], Alexa Woods  Gender: Female  DOB: 1961/03/25  Age: 53 Years  PCP: Loura Pardon [facility])  Pregnant: No  Office Follow Up:  Does the office need to follow up with this patient?: Yes  Instructions For The Office: Please return call to patient at [phone] regarding urgent work in appt./recommendation.  RN Note:  Hx: Hysterectomy. Patient states she fell injuring her right hand, wrist and forearm 12/08/13. Patient states she was seen in the office 12/13/13. Patient had Xrays completed. Patient was diagnosed with Scapholunate Dislocation of the right wrist. Patient was prescribed Percocet, Zanaflex and a brace. Patient states no relief of discomfort. Patient states pain is at a " 8 " on a 1-10 scale. Patient last had Percocet at 2130 12/17/13. Patient states pain increases with the movement of her fingers. Patient states she has numbness and tingling of forearm, hand and fingers. Onset 12/13/13. States numbness has become progressively worse. States she is unable to straighten her right little finger. Fingers are pink, warm and moveable. Patient states her hand is swollen. Patient states she has an appt. scheduled on 12/26/13 with [facility]. RN spoke with Cristie Hem at [employer]. States no appts. available for patient to be seen today or sooner than 12/26/13. Cristie Hem states she will place patient on the cancellation list to be called if an earlier appt. becomes available. Patient informed. No appts. available in [place] Record.  Please return call to patient at [phone] regarding urgent work in appt./recommendation.  Call back parameters reviewed. Patient verbalizes understanding.  Symptoms  Reason For Call & Symptoms: right hand, wrist, forearm pain  Reviewed Health History In EMR: Yes  Reviewed Medications In EMR: Yes  Reviewed Allergies In EMR: Yes  Reviewed Surgeries  / Procedures: Yes  Date of Onset of Symptoms: 12/08/2013  Treatments Tried: Percocet, Zanaflex, Ice, Brace  Treatments Tried Worked: No OB / GYN:  LMP: Unknown  Guideline(s) Used:  Arm Injury  Hand and Wrist Injury  Disposition Per Guideline:   Go to ED Now (or to Office with PCP Approval)  Reason For Disposition Reached:   Numbness (loss of sensation) of finger(s)  Advice Given:  Call Back If:  You become worse.  Patient Will Follow Care Advice:  YES

## 2013-12-18 NOTE — Telephone Encounter (Signed)
Spoke with Alexa Woods.  Offered her an appointment to follow up with Dr. Lorelei Pont on Wednesday to reassess.  She states she already has an appointment with Dr. Glori Bickers on Wednesday.

## 2013-12-18 NOTE — Telephone Encounter (Signed)
This should not be an emergent problem. She is stable in her brace.   I can reassess her Wednesday if she desires it.   The patient does not have a carpal bone dislocation or I would have arranged for emergent intervention.   Probable scapholunate dissociation. This is non-emergent.  Electronically Signed  By: Owens Loffler, MD On: 12/18/2013 4:08 PM

## 2013-12-18 NOTE — Telephone Encounter (Signed)
Will forward to DR Copland for advice

## 2013-12-20 ENCOUNTER — Ambulatory Visit (INDEPENDENT_AMBULATORY_CARE_PROVIDER_SITE_OTHER): Payer: Managed Care, Other (non HMO) | Admitting: Family Medicine

## 2013-12-20 ENCOUNTER — Encounter: Payer: Self-pay | Admitting: Family Medicine

## 2013-12-20 VITALS — BP 106/74 | HR 101 | Temp 98.6°F | Ht 67.0 in | Wt 253.0 lb

## 2013-12-20 DIAGNOSIS — E119 Type 2 diabetes mellitus without complications: Secondary | ICD-10-CM

## 2013-12-20 DIAGNOSIS — M2540 Effusion, unspecified joint: Secondary | ICD-10-CM

## 2013-12-20 DIAGNOSIS — E785 Hyperlipidemia, unspecified: Secondary | ICD-10-CM

## 2013-12-20 DIAGNOSIS — I10 Essential (primary) hypertension: Secondary | ICD-10-CM

## 2013-12-20 DIAGNOSIS — L408 Other psoriasis: Secondary | ICD-10-CM

## 2013-12-20 NOTE — Progress Notes (Signed)
Pre visit review using our clinic review tool, if applicable. No additional management support is needed unless otherwise documented below in the visit note. 

## 2013-12-20 NOTE — Progress Notes (Signed)
Subjective:    Patient ID: Alexa Woods, female    DOB: 1961/01/26, 53 y.o.   MRN: [id number]  HPI Here for f/u of chronic medical problems   She is having a lot of pain from her scapholunate dislocation  Injured it in the shower (along with her knee)  Has a lot of pain in the wrist and the arm  Is swollen and her R 5th finger is stiff  Stinging feeling in mid arm also   Has appt with Dr Amedeo Plenty on 7/28 (has seen him in the past as well)   Also mentioned some rheumatoid changes in joints - poss psoriatic arthritis (suspects this)  Her psoriasis gets worse on hands - when she is more stressed  Is hard to tell if it is eczema or psoriasis - happens on feet and hands only  She does have random pains in joints-not necessarily with swelling   Is due for diabetes labs  She has been unable to see the endocrinologist  Has not been to the eye doctor since 2009 All due to lack of $ at this point-is having trouble with marriage also   Many stressors as usual at home-alcoholic husband she is financially dependent on   Patient Active Problem List   Diagnosis Date Noted  . Leg pain, bilateral 11/28/2013  . Acute bacterial sinusitis 10/17/2013  . Abdominal  pain, other specified site 03/16/2013  . Paresthesia of skin 02/08/2013  . Rash of face 02/08/2013  . Tick bite of back 02/03/2013  . Periodontal disease 02/03/2013  . Hyperlipidemia, mild 09/27/2012  . Unspecified gastritis and gastroduodenitis without mention of hemorrhage 08/02/2012  . Rash of hands 05/23/2012  . Asthma with acute exacerbation 05/23/2012  . 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 Lung density on x-ray 02/23/2011  . Thrombocytopenia 02/20/2011  . DYSPHAGIA UNSPECIFIED 08/07/2010  . Low back pain 07/22/2010  . MORBID OBESITY 03/30/2008  . 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  . DIABETES MELLITUS, TYPE II 11/30/2006  . ANXIETY 11/30/2006  . History of alcohol abuse 11/30/2006  . DEPRESSION  11/30/2006  . HYPERTENSION 11/30/2006  . HEMORRHOIDS 11/30/2006  . ALLERGIC RHINITIS 11/30/2006  . ASTHMA 11/30/2006  . GERD 11/30/2006  . CYSTITIS, CHRONIC 11/30/2006  . PSORIASIS 11/30/2006   Past Medical History  Diagnosis Date  . Epigastric abdominal pain   . Alcohol abuse, unspecified   . Allergic rhinitis, cause unspecified   . Anxiety state, unspecified   . Unspecified asthma(493.90)   . Backache, unspecified   . Other chronic cystitis   . Depressive disorder, not elsewhere classified   . Type II or unspecified type diabetes mellitus without mention of complication, not stated as uncontrolled   . Dysphagia, unspecified(787.20)   . Esophageal reflux   . Unspecified hemorrhoids without mention of complication   . Herpes zoster without mention of complication   . Unspecified essential hypertension   . Hypopotassemia   . Morbid obesity   . Other screening mammogram   . Pain in joint, lower leg   . Other psoriasis   . History of Bell's palsy 4/09  . Wrist fracture   . Edema   . Gastroparesis   . Myocardial infarction    Past Surgical History  Procedure Laterality Date  . Appendectomy    . Cholecystectomy    . Partial hysterectomy    . Knee arthroscopy w/ partial medial meniscectomy Right 10/2008    and chondroplasty  . Total knee arthroplasty  2011  Dr. Ricki Rodriguez  . Esophagogastroduodenoscopy  07/2002    erythematous gastropathy  . Shoulder surgery  05/2010    Dr. Alphonzo Severance  . Dobutamine stress echo  2009     normal stress echo, no evidence of ischemia  . Mri  11/2102    pt states she has a bone spur on her spine   History  Substance Use Topics  . Smoking status: Former Smoker    Quit date: 12/30/2001  . Smokeless tobacco: Never Used  . Alcohol Use: No     Comment: former alcoholic    Family History  Problem Relation Age of Onset  . Alcohol abuse Mother   . Hypertension Mother   . Esophageal cancer Maternal Grandmother    Allergies  Allergen  Reactions  . Bupropion Hcl   . Cefuroxime Axetil Nausea Only  . Codeine     REACTION: nausea and vomiting, rash  . Lidocaine     REACTION: unknown  . Metformin     REACTION: GI  . Paroxetine     REACTION: doesn't agree  . Propoxyphene N-Acetaminophen     REACTION: wheezing  . Sulfonamide Derivatives     REACTION: rash  . Tramadol Hcl     REACTION: Causes Anxiety   Current Outpatient Prescriptions on File Prior to Visit  Medication Sig Dispense Refill  . albuterol (PROVENTIL HFA;VENTOLIN HFA) 108 (90 BASE) MCG/ACT inhaler Inhale 2 puffs into the lungs every 4 (four) hours as needed for wheezing or shortness of breath.  54 g  0  . glucose blood (ONE TOUCH ULTRA TEST) test strip Use to test blood sugar 4 times daily as instructed.  125 each  2  . hydrochlorothiazide (HYDRODIURIL) 25 MG tablet TAKE ONE (1) TABLET BY MOUTH EACH DAY  90 tablet  0  . insulin aspart (NOVOLOG) 100 UNIT/ML injection Inject 1-7 Units into the skin as directed. 1-7 units per sliding scale.      . insulin glargine (LANTUS) 100 UNIT/ML injection Inject 0.45 mLs (45 Units total) into the skin at bedtime.  10 mL  0  . Insulin Pen Needle (LEADER UNIFINE PENTIPS PLUS) 31G X 5 MM MISC Use twice daily as instructed.  100 each  2  . Lancets (ONETOUCH ULTRASOFT) lancets Use to test blood sugar 4 times daily as instructed.  125 each  2  . LORazepam (ATIVAN) 1 MG tablet Take 1 mg by mouth every 8 (eight) hours as needed for anxiety or sleep.       Marland Kitchen omeprazole (PRILOSEC) 20 MG capsule Take 1 capsule (20 mg total) by mouth daily.  90 capsule  0  . omeprazole (PRILOSEC) 20 MG capsule TAKE TWO (2) CAPSULES BY MOUTH EACH DAY  60 capsule  0  . oxyCODONE-acetaminophen (PERCOCET) 10-325 MG per tablet Take 1 tablet by mouth every 6 (six) hours as needed. For pain       . perphenazine (TRILAFON) 2 MG tablet Take 2 mg by mouth at bedtime.      . potassium chloride (K-DUR,KLOR-CON) 10 MEQ tablet TAKE 1 TABLET BY MOUTH DAILY  90 tablet   0  . sertraline (ZOLOFT) 100 MG tablet Take 200 mg by mouth daily.       Marland Kitchen tiZANidine (ZANAFLEX) 4 MG tablet Take 4 mg by mouth every 8 (eight) hours as needed. For muscle spasm       . topiramate (TOPAMAX) 50 MG tablet       . zolpidem (AMBIEN) 10 MG tablet Take 15  mg by mouth at bedtime as needed for sleep.       . [DISCONTINUED] potassium chloride (KLOR-CON) 10 MEQ CR tablet Take 1 tablet (10 mEq total) by mouth daily.  60 tablet  6   No current facility-administered medications on file prior to visit.    Review of Systems Review of Systems  Constitutional: Negative for fever, appetite change, and unexpected weight change.  Eyes: Negative for pain and visual disturbance.  Respiratory: Negative for cough and shortness of breath. (pos for a little lung congestion from last uri but no wheeze)  Cardiovascular: Negative for cp or palpitations    Gastrointestinal: Negative for nausea, diarrhea and constipation.  Genitourinary: Negative for urgency and pos for chronic urinary frequency, pos for thirst  Skin: Negative for pallor and pos for rash on hands/ feet    MSK pos for R hand and wrist pain  Neurological: Negative for weakness, light-headedness, and headaches.  Hematological: Negative for adenopathy. Does not bruise/bleed easily.  Psychiatric/Behavioral: Negative for dysphoric mood. The patient is anxious .         Objective:   Physical Exam  Constitutional: She appears well-developed and well-nourished. No distress.  obese and well appearing   HENT:  Head: Normocephalic and atraumatic.  Mouth/Throat: Oropharynx is clear and moist.  Eyes: Conjunctivae and EOM are normal. Pupils are equal, round, and reactive to light. No scleral icterus.  Neck: Normal range of motion. Neck supple.  Cardiovascular: Normal rate, regular rhythm and intact distal pulses.   Pulmonary/Chest: Effort normal and breath sounds normal. No respiratory distress. She has no wheezes. She has no rales.    Abdominal: Soft. Bowel sounds are normal.  Musculoskeletal: She exhibits edema.  R wrist edema (wearing a splint) No other acute joint changes    Lymphadenopathy:    She has no cervical adenopathy.  Neurological: She is alert. She has normal reflexes.  Skin: Skin is warm and dry. Rash noted. No erythema. No pallor.  Palms of hands- plaque like rash with some papule Same on heel and arch of L foot     Psychiatric: She has a normal mood and affect.          Assessment & Plan:   Problem List Items Addressed This Visit     Cardiovascular and Mediastinum   HYPERTENSION - Primary      bp in fair control at this time  BP Readings from Last 1 Encounters:  12/20/13 106/74   No changes needed Disc lifstyle change with low sodium diet and exercise      Relevant Orders      Comprehensive metabolic panel      Lipid panel     Endocrine   DIABETES MELLITUS, TYPE II     Needs to get re est with endocrinologist- pref to go to [facility] so will ref to endo at our [place] office  Was unable to tol lantus in the past  At times cannot afford strips  Urged better eating and wt loss  A1C today    Relevant Orders      Hemoglobin A1c      Lipid panel      Ambulatory referral to Endocrinology     Musculoskeletal and Integument   PSORIASIS     With joint changes on xray recent suggestive of psoriatic arthritis  Has f/u with Dr Amedeo Plenty soon  Will check RF and ANA today    Relevant Orders      Rheumatoid factor (Completed)  ANA (Completed)     Other   Hyperlipidemia, mild     Lab today Rev low sat fat diet      Relevant Orders      Lipid panel   Joint swelling     - joint aches and pains (in addition to scapholunate diss in R wrist)  She has psoriasis and some erosions on xr sugg of psoriatic arthritis Will f/u Dr Amedeo Plenty  Added ANA and RF to lab today

## 2013-12-20 NOTE — Patient Instructions (Signed)
Labs today - A1C and cholesterol and some labs for arthritis Follow up with Dr Amedeo Plenty as planned  Stop at check out for referral to the endocrinologist at the [facility] office  Take care of yourself

## 2013-12-21 LAB — COMPREHENSIVE METABOLIC PANEL
ALBUMIN: 3.8 g/dL (ref 3.5–5.2)
ALT: 20 U/L (ref 0–35)
AST: 17 U/L (ref 0–37)
Alkaline Phosphatase: 95 U/L (ref 39–117)
BUN: 16 mg/dL (ref 6–23)
CALCIUM: 9.2 mg/dL (ref 8.4–10.5)
CHLORIDE: 99 meq/L (ref 96–112)
CO2: 33 meq/L — AB (ref 19–32)
Creatinine, Ser: 1 mg/dL (ref 0.4–1.2)
GFR: 63.65 mL/min (ref >60.00)
GLUCOSE: 368 mg/dL — AB (ref 70–99)
Potassium: 3.7 mEq/L (ref 3.5–5.1)
SODIUM: 137 meq/L (ref 135–145)
Total Bilirubin: 0.5 mg/dL (ref 0.2–1.2)
Total Protein: 7.7 g/dL (ref 6.0–8.3)

## 2013-12-21 LAB — LIPID PANEL
CHOL/HDL RATIO: 3
Cholesterol: 198 mg/dL (ref 0–200)
HDL: 69.6 mg/dL (ref >39.00)
LDL Cholesterol: 103 mg/dL — ABNORMAL HIGH (ref 0–99)
NONHDL: 128.4
Triglycerides: 126 mg/dL (ref 0.0–149.0)
VLDL: 25.2 mg/dL (ref 0.0–40.0)

## 2013-12-21 LAB — ANA: Anti Nuclear Antibody(ANA): NEGATIVE

## 2013-12-21 LAB — RHEUMATOID FACTOR

## 2013-12-21 LAB — HEMOGLOBIN A1C: HEMOGLOBIN A1C: 9.9 % — AB (ref 4.6–6.5)

## 2013-12-21 NOTE — Assessment & Plan Note (Signed)
Needs to get re est with endocrinologist- pref to go to [facility] so will ref to endo at our [place] office  Was unable to tol lantus in the past  At times cannot afford strips  Urged better eating and wt loss  A1C today

## 2013-12-21 NOTE — Assessment & Plan Note (Signed)
bp in fair control at this time  BP Readings from Last 1 Encounters:  12/20/13 106/74   No changes needed Disc lifstyle change with low sodium diet and exercise

## 2013-12-21 NOTE — Assessment & Plan Note (Signed)
With joint changes on xray recent suggestive of psoriatic arthritis  Has f/u with Dr Amedeo Plenty soon  Will check RF and ANA today

## 2013-12-21 NOTE — Assessment & Plan Note (Signed)
Lab today Rev low sat fat diet

## 2013-12-21 NOTE — Assessment & Plan Note (Signed)
-   joint aches and pains (in addition to scapholunate diss in R wrist)  She has psoriasis and some erosions on xr sugg of psoriatic arthritis Will f/u Dr Amedeo Plenty  Added ANA and RF to lab today

## 2013-12-22 ENCOUNTER — Encounter: Payer: Self-pay | Admitting: *Deleted

## 2014-01-02 ENCOUNTER — Other Ambulatory Visit: Payer: Self-pay | Admitting: *Deleted

## 2014-01-02 MED ORDER — INSULIN GLARGINE 100 UNIT/ML ~~LOC~~ SOLN: 45.0000 [IU] | Freq: Every day | SUBCUTANEOUS | Status: DC

## 2014-02-08 ENCOUNTER — Other Ambulatory Visit: Payer: Self-pay | Admitting: Family Medicine

## 2014-02-08 NOTE — Telephone Encounter (Signed)
Will refill electronically  

## 2014-02-13 ENCOUNTER — Encounter: Payer: Self-pay | Admitting: Internal Medicine

## 2014-02-13 ENCOUNTER — Ambulatory Visit (INDEPENDENT_AMBULATORY_CARE_PROVIDER_SITE_OTHER): Payer: Managed Care, Other (non HMO) | Admitting: Internal Medicine

## 2014-02-13 VITALS — BP 114/64 | HR 71 | Temp 97.7°F | Resp 12 | Wt 256.0 lb

## 2014-02-13 DIAGNOSIS — E119 Type 2 diabetes mellitus without complications: Secondary | ICD-10-CM

## 2014-02-13 MED ORDER — INSULIN GLARGINE 100 UNIT/ML ~~LOC~~ SOLN: 30.0000 [IU] | Freq: Every day | SUBCUTANEOUS | Status: DC

## 2014-02-13 MED ORDER — INSULIN ASPART 100 UNIT/ML ~~LOC~~ SOLN: 7.0000 [IU] | Freq: Three times a day (TID) | SUBCUTANEOUS | Status: DC

## 2014-02-13 NOTE — Progress Notes (Signed)
Patient ID: Alexa Woods, female   DOB: August 22, 1960, 53 y.o.   MRN: [id number]  HPI: Alexa Woods is a 53 y.o.-year-old female, returning for f/u for DM2 dx 1997, insulin-dependent since 2010, uncontrolled, with  complications (gastroparesis - dx 2012, PN). Last visit 11 mo ago!  Last hemoglobin A1c was: Lab Results  Component Value Date   HGBA1C 9.9* 12/20/2013   HGBA1C 9.5* 03/08/2013   HGBA1C 8.0* 09/23/2012   Pt was on a regimen of: - Lantus 50 units at lunchtime - Novolog SSI tid ac - takes it only if >200 3 days in a row - pen Could not tolerate Metformin >> GI upset. (N+V) Tried Januvia >> nausea.  We switched to: - Lantus 30 units at night. - Novolog 7 units with breakfast, lunch and dinner. Please take 9 units for a large meal. - Following Sliding scale - add to the above doses: - 150-175: + 1 unit  - 176-200: + 2 units  - 201-225: + 3 units  - 226-250: + 4 units  - 251-275: + 5 units - 276-300: + 6 units  She is only using (could not afford the insulin pens): - Lantus 45 units at night - no NovoLog She sometimes can go days w/o the Lantus when she cannot afford it - now she has vials of Lantus - better affordable  Pt checks her sugars 1-2x day and they are - no log, no meter: - am: 90-190, but usually ~120 >> 110-150, sometimes higher when misses Lantus - before lunch: 170s >> n/c - before dinner: 160s, can increase to 200s >> same  - bedtime: 200s >> same Can have lows. Lowest sugar was 100; she has hypoglycemia awareness at 90. Highest sugar was 200s- rarely in last 3 mo.   Pt's meals are: - Breakfast: bowl of cereal (rice krispies, lucky charms) with milk 2% - Lunch: PB sandwich - Dinner: pork chop + rice/potatoes + green beans - can/no salads - Snacks: no; smtms apples or bananas She is limited in what she can eat due to her gastroparesis.  - no CKD, last BUN/creatinine:  Lab Results  Component Value Date   BUN 16 12/20/2013   CREATININE 1.0 12/20/2013   Not an an ACEI. Last ACR 0.3 in 04/2012. - last set of lipids: Lab Results  Component Value Date   CHOL 198 12/20/2013   HDL 69.60 12/20/2013   LDLCALC 103* 12/20/2013   LDLDIRECT 126.8 09/23/2012   TRIG 126.0 12/20/2013   CHOLHDL 3 12/20/2013  Not on a Statin. - last eye exam was in ~2009. No DR. Cannot afford eye exam , does have blurry vision. - + numbness and tingling in her feet.  I reviewed pt's medications, allergies, PMH, social hx, family hx and no changes required, except as mentioned above.  ROS: Constitutional: + all: weight gain/loss, fatigue, subjective hyperthermia/hypothermia, poor sleep Eyes: + blurry vision, no xerophthalmia ENT: no sore throat, no nodules palpated in throat, no dysphagia/odynophagia, +  hoarseness, + tinnitus Cardiovascular: + all: CP/SOB/palpitations/leg swelling Respiratory: no cough/+ SOB/+ wheezing Gastrointestinal: + N/+ V/no D/+ C/+ heartburn Musculoskeletal: + both: muscle/joint aches Skin: + rash, + itching Neurological: no tremors/numbness/tingling/dizziness + low libido  PE: BP 114/64  Pulse 71  Temp(Src) 97.7 F (36.5 C) (Oral)  Resp 12  Wt 256 lb (116.121 kg)  SpO2 97% Wt Readings from Last 3 Encounters:  02/13/14 256 lb (116.121 kg)  12/20/13 253 lb (114.76 kg)  12/13/13 251 lb 4 oz (  113.966 kg)   Constitutional: obese, in NAD, flat affect Eyes: PERRLA, EOMI, no exophthalmos ENT: moist mucous membranes, no thyromegaly, no cervical lymphadenopathy Cardiovascular: RRR, No MRG Respiratory: CTA B Gastrointestinal: abdomen soft, NT, ND, BS+ Musculoskeletal: no deformities, strength intact in all 4 Skin: moist, warm, no rashes Neurological: no tremor with outstretched hands, DTR normal in all 4  ASSESSMENT: 1. DM2, insulin-dependent, uncontrolled, with complications - gastroparesis - 07/2010 - At 120 minutes, the amount of tracer remaining in the stomach ~39% (nl <30%). Seen by Dr Sharlett Iles - recommended to start Domperidone  a that time >> could not afford.  - PN  2. Gastroparesis  PLAN:  1. Patient with long-standing, recently more uncontrolled diabetes, on a large amount of basal insulin and no mealtime insulin (could not afford pens...) >> she eats more for fear of hypoglycemia >> weight gain >> exacerbation of DM control >> etc. - We discussed about options for treatment, and I again suggested to add NovoLog  - vials, as these are covered by her insurance - needs to take them as she starts eating or a little after 2/2 gastroparesis:  Patient Instructions  Please decrease Lantus to 30 units at night. Take Novolog 7 units with breakfast, lunch and dinner.  Use the following Sliding scale - add to the above doses: - 150-175: + 1 unit  - 176-200: + 2 units  - 201-225: + 3 units  - 226-250: + 4 units  - 251-275: + 5 units - 276-300: + 6 units Skip the mealtime insulin if you skip a meal, but try not to skip meals. Please return in 1.5 months with your sugar log.  - continue checking her sugars at different times of the day - check 3 times a day, rotating checks  - advised for yearly eye exams >> advised to call insurance and get her to pay for it as this is not prn - reviewed recent HbA1c level - Return to clinic in 1.5 mo with sugar log   2. Gastroparesis - cannot afford med - given recs Re: diet (brochure)

## 2014-02-13 NOTE — Patient Instructions (Signed)
Please decrease Lantus to 30 units at night. Take Novolog 7 units with breakfast, lunch and dinner.  Use the following Sliding scale - add to the above doses: - 150-175: + 1 unit  - 176-200: + 2 units  - 201-225: + 3 units  - 226-250: + 4 units  - 251-275: + 5 units - 276-300: + 6 units Skip the mealtime insulin if you skip a meal, but try not to skip meals. Please return in 1.5 months with your sugar log.

## 2014-02-26 ENCOUNTER — Ambulatory Visit (INDEPENDENT_AMBULATORY_CARE_PROVIDER_SITE_OTHER): Payer: Managed Care, Other (non HMO) | Admitting: Family Medicine

## 2014-02-26 ENCOUNTER — Encounter: Payer: Self-pay | Admitting: Family Medicine

## 2014-02-26 VITALS — BP 128/72 | HR 81 | Temp 98.3°F | Ht 67.0 in | Wt 246.2 lb

## 2014-02-26 DIAGNOSIS — L723 Sebaceous cyst: Secondary | ICD-10-CM

## 2014-02-26 DIAGNOSIS — L089 Local infection of the skin and subcutaneous tissue, unspecified: Secondary | ICD-10-CM

## 2014-02-26 MED ORDER — DOXYCYCLINE HYCLATE 100 MG PO TABS: 100.0000 mg | ORAL_TABLET | Freq: Two times a day (BID) | ORAL | Status: DC

## 2014-02-26 NOTE — Progress Notes (Signed)
Pre visit review using our clinic review tool, if applicable. No additional management support is needed unless otherwise documented below in the visit note. 

## 2014-02-26 NOTE — Patient Instructions (Signed)
Try to keep area on back clean with soap and water  It will continue to drain  Warm compresses tend to help  Take the doxycycline as directed  Use antibiotic ointment and loose dressing   Update me if worse or any changes - like increasing redness

## 2014-02-26 NOTE — Progress Notes (Signed)
Subjective:    Patient ID: Alexa Woods, female    DOB: Jun 21, 1960, 53 y.o.   MRN: [id number]  HPI Has a 'thing' on her back - started as a boil 20 years ago  Getting bigger and bigger and the core won't come out  Now it is hurting more (esp when bra rubs it or she sits back in a chair)  May have been draining a bit   No fever Feels ok    Patient Active Problem List   Diagnosis Date Noted  . Joint swelling 12/20/2013  . Leg pain, bilateral 11/28/2013  . Acute bacterial sinusitis 10/17/2013  . Abdominal  pain, other specified site 03/16/2013  . Paresthesia of skin 02/08/2013  . Rash of face 02/08/2013  . Tick bite of back 02/03/2013  . Periodontal disease 02/03/2013  . Hyperlipidemia, mild 09/27/2012  . Unspecified gastritis and gastroduodenitis without mention of hemorrhage 08/02/2012  . Rash of hands 05/23/2012  . Asthma with acute exacerbation 05/23/2012  . 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 Lung density on x-ray 02/23/2011  . Thrombocytopenia 02/20/2011  . DYSPHAGIA UNSPECIFIED 08/07/2010  . Low back pain 07/22/2010  . MORBID OBESITY 03/30/2008  . 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  . DIABETES MELLITUS, TYPE II 11/30/2006  . ANXIETY 11/30/2006  . History of alcohol abuse 11/30/2006  . DEPRESSION 11/30/2006  . HYPERTENSION 11/30/2006  . HEMORRHOIDS 11/30/2006  . ALLERGIC RHINITIS 11/30/2006  . ASTHMA 11/30/2006  . GERD 11/30/2006  . CYSTITIS, CHRONIC 11/30/2006  . PSORIASIS 11/30/2006   Past Medical History  Diagnosis Date  . Epigastric abdominal pain   . Alcohol abuse, unspecified   . Allergic rhinitis, cause unspecified   . Anxiety state, unspecified   . Unspecified asthma(493.90)   . Backache, unspecified   . Other chronic cystitis   . Depressive disorder, not elsewhere classified   . Type II or unspecified type diabetes mellitus without mention of complication, not stated as uncontrolled   . Dysphagia, unspecified(787.20)   .  Esophageal reflux   . Unspecified hemorrhoids without mention of complication   . Herpes zoster without mention of complication   . Unspecified essential hypertension   . Hypopotassemia   . Morbid obesity   . Other screening mammogram   . Pain in joint, lower leg   . Other psoriasis   . History of Bell's palsy 4/09  . Wrist fracture   . Edema   . Gastroparesis   . Myocardial infarction    Past Surgical History  Procedure Laterality Date  . Appendectomy    . Cholecystectomy    . Partial hysterectomy    . Knee arthroscopy w/ partial medial meniscectomy Right 10/2008    and chondroplasty  . Total knee arthroplasty  2011    Dr. Ricki Rodriguez  . Esophagogastroduodenoscopy  07/2002    erythematous gastropathy  . Shoulder surgery  05/2010    Dr. Alphonzo Severance  . Dobutamine stress echo  2009     normal stress echo, no evidence of ischemia  . Mri  11/2102    pt states she has a bone spur on her spine   History  Substance Use Topics  . Smoking status: Former Smoker    Quit date: 12/30/2001  . Smokeless tobacco: Never Used  . Alcohol Use: No     Comment: former alcoholic    Family History  Problem Relation Age of Onset  . Alcohol abuse Mother   . Hypertension Mother   . Esophageal cancer  Maternal Grandmother    Allergies  Allergen Reactions  . Bupropion Hcl   . Cefuroxime Axetil Nausea Only  . Codeine     REACTION: nausea and vomiting, rash  . Lidocaine     REACTION: unknown  . Metformin     REACTION: GI  . Paroxetine     REACTION: doesn't agree  . Propoxyphene N-Acetaminophen     REACTION: wheezing  . Sulfonamide Derivatives     REACTION: rash  . Tramadol Hcl     REACTION: Causes Anxiety   Current Outpatient Prescriptions on File Prior to Visit  Medication Sig Dispense Refill  . albuterol (PROVENTIL HFA;VENTOLIN HFA) 108 (90 BASE) MCG/ACT inhaler Inhale 2 puffs into the lungs every 4 (four) hours as needed for wheezing or shortness of breath.  54 g  0  . glucose blood  (ONE TOUCH ULTRA TEST) test strip Use to test blood sugar 4 times daily as instructed.  125 each  2  . hydrochlorothiazide (HYDRODIURIL) 25 MG tablet TAKE ONE (1) TABLET BY MOUTH EACH DAY  90 tablet  0  . insulin aspart (NOVOLOG) 100 UNIT/ML injection Inject 7-10 Units into the skin 3 (three) times daily with meals. 1-7 units per sliding scale.  10 mL  2  . insulin glargine (LANTUS) 100 UNIT/ML injection Inject 0.3 mLs (30 Units total) into the skin at bedtime.  20 mL  2  . Insulin Pen Needle (LEADER UNIFINE PENTIPS PLUS) 31G X 5 MM MISC Use twice daily as instructed.  100 each  2  . Lancets (ONETOUCH ULTRASOFT) lancets Use to test blood sugar 4 times daily as instructed.  125 each  2  . LORazepam (ATIVAN) 1 MG tablet Take 1 mg by mouth every 8 (eight) hours as needed for anxiety or sleep.       Marland Kitchen omeprazole (PRILOSEC) 20 MG capsule Take 1 capsule (20 mg total) by mouth daily.  90 capsule  0  . omeprazole (PRILOSEC) 20 MG capsule TAKE TWO CAPSULES BY MOUTH DAILY  60 capsule  11  . oxyCODONE-acetaminophen (PERCOCET) 10-325 MG per tablet Take 1 tablet by mouth every 6 (six) hours as needed. For pain       . perphenazine (TRILAFON) 2 MG tablet Take 2 mg by mouth at bedtime.      . potassium chloride (K-DUR,KLOR-CON) 10 MEQ tablet TAKE 1 TABLET BY MOUTH DAILY  90 tablet  0  . sertraline (ZOLOFT) 100 MG tablet Take 200 mg by mouth daily.       Marland Kitchen tiZANidine (ZANAFLEX) 4 MG tablet Take 4 mg by mouth every 8 (eight) hours as needed. For muscle spasm       . zolpidem (AMBIEN) 10 MG tablet Take 15 mg by mouth at bedtime as needed for sleep.       . [DISCONTINUED] potassium chloride (KLOR-CON) 10 MEQ CR tablet Take 1 tablet (10 mEq total) by mouth daily.  60 tablet  6   No current facility-administered medications on file prior to visit.      Review of Systems Review of Systems  Constitutional: Negative for fever, appetite change,  and unexpected weight change.  Eyes: Negative for pain and visual  disturbance.  Respiratory: Negative for cough and shortness of breath.   Cardiovascular: Negative for cp or palpitations    Gastrointestinal: Negative for nausea, diarrhea and constipation.  Genitourinary: Negative for urgency and frequency.  Skin: Negative for pallor or rash  pos for cyst on back that comes and goes with  a "blackhead" Neurological: Negative for weakness, light-headedness, numbness and headaches.  Hematological: Negative for adenopathy. Does not bruise/bleed easily.  Psychiatric/Behavioral: Negative for dysphoric mood. The patient is not nervous/anxious.         Objective:   Physical Exam  Constitutional: She appears well-developed and well-nourished. No distress.  obese and well appearing   Eyes: Conjunctivae and EOM are normal. Pupils are equal, round, and reactive to light.  Neck: Normal range of motion. Neck supple.  Cardiovascular: Normal rate, regular rhythm and normal heart sounds.   Pulmonary/Chest: Effort normal and breath sounds normal. No respiratory distress. She has no wheezes.  Lymphadenopathy:    She has no cervical adenopathy.  Neurological: She is alert.  Skin: Skin is warm and dry. No rash noted.  1.5 cm sebaceous cyst with moderate comedone - is draining (mid back) Scant erythema surrounding cyst  Able to express comedone and caseous material with relief Cleaned and dressed with band aid and gauze     Psychiatric: She has a normal mood and affect.          Assessment & Plan:   Problem List Items Addressed This Visit     Musculoskeletal and Integument   Infected sebaceous cyst - Primary     Moderate sized comedone extracted and caseous material followed with relief  Will tx with doxycycline - inst pt to call if redness/ inc swelling or other symptoms  Disc wound care -she will get her daughter to help with that

## 2014-02-26 NOTE — Assessment & Plan Note (Signed)
Moderate sized comedone extracted and caseous material followed with relief  Will tx with doxycycline - inst pt to call if redness/ inc swelling or other symptoms  Disc wound care -she will get her daughter to help with that

## 2014-03-05 ENCOUNTER — Other Ambulatory Visit: Payer: Self-pay | Admitting: *Deleted

## 2014-03-05 MED ORDER — INSULIN GLARGINE 100 UNIT/ML ~~LOC~~ SOLN: 30.0000 [IU] | Freq: Every day | SUBCUTANEOUS | Status: DC

## 2014-03-09 ENCOUNTER — Other Ambulatory Visit: Payer: Self-pay | Admitting: *Deleted

## 2014-03-09 MED ORDER — INSULIN GLARGINE 100 UNIT/ML ~~LOC~~ SOLN: 30.0000 [IU] | Freq: Every day | SUBCUTANEOUS | Status: DC

## 2014-03-13 ENCOUNTER — Other Ambulatory Visit: Payer: Self-pay | Admitting: *Deleted

## 2014-03-13 MED ORDER — INSULIN GLARGINE 100 UNIT/ML ~~LOC~~ SOLN: 30.0000 [IU] | Freq: Every day | SUBCUTANEOUS | Status: DC

## 2014-03-16 ENCOUNTER — Other Ambulatory Visit: Payer: Self-pay | Admitting: Family Medicine

## 2014-03-30 ENCOUNTER — Ambulatory Visit: Payer: Managed Care, Other (non HMO) | Admitting: Internal Medicine

## 2014-04-02 ENCOUNTER — Ambulatory Visit (INDEPENDENT_AMBULATORY_CARE_PROVIDER_SITE_OTHER): Payer: Managed Care, Other (non HMO) | Admitting: Internal Medicine

## 2014-04-02 ENCOUNTER — Other Ambulatory Visit: Payer: Self-pay | Admitting: *Deleted

## 2014-04-02 ENCOUNTER — Encounter: Payer: Self-pay | Admitting: Internal Medicine

## 2014-04-02 VITALS — BP 102/62 | HR 92 | Temp 98.5°F | Resp 12 | Wt 246.8 lb

## 2014-04-02 DIAGNOSIS — E1142 Type 2 diabetes mellitus with diabetic polyneuropathy: Secondary | ICD-10-CM

## 2014-04-02 DIAGNOSIS — Z23 Encounter for immunization: Secondary | ICD-10-CM

## 2014-04-02 DIAGNOSIS — E1140 Type 2 diabetes mellitus with diabetic neuropathy, unspecified: Secondary | ICD-10-CM

## 2014-04-02 MED ORDER — GLIPIZIDE ER 5 MG PO TB24: 5.0000 mg | ORAL_TABLET | Freq: Every day | ORAL | Status: DC

## 2014-04-02 NOTE — Progress Notes (Signed)
Patient ID: CLARIBEL SACHS, female   DOB: 08/04/1960, 53 y.o.   MRN: [id number]  HPI: MARLYSS CISSELL is a 53 y.o.-year-old female, returning for f/u for DM2 dx 1997, insulin-dependent since 2010, uncontrolled, with  complications (gastroparesis - dx 2012, PN). Last visit 1.5 mo ago!  Last hemoglobin A1c was: Lab Results  Component Value Date   HGBA1C 9.9* 12/20/2013   HGBA1C 9.5* 03/08/2013   HGBA1C 8.0* 09/23/2012   Pt was on a regimen of: - Lantus 50 units at lunchtime - Novolog SSI tid ac - takes it only if >200 3 days in a row - pen Could not tolerate Metformin >> GI upset. (N+V) Tried Januvia >> nausea.  We switched to: - Lantus 30 units at night  - Novolog 7 units with breakfast, lunch and dinner. Please take 9 units for a large meal. - Following Sliding scale - add to the above doses: - 150-175: + 1 unit  - 176-200: + 2 units  - 201-225: + 3 units  - 226-250: + 4 units  - 251-275: + 5 units - 276-300: + 6 units  She is only using (could not afford the insulin pens): - Lantus 45 units at night >> 40 units - no NovoLog She sometimes can go days w/o the Lantus when she cannot afford it - now she has vials of Lantus - better affordable  Pt checks her sugars 1-2x day and they are - no log, no meter: - am: 90-190, but usually ~120 >> 110-150 >> 83-157 - after b'fast: 193, 200 - before lunch: 170s >> n/c  - after lunch: 87, 99, 115-165, 266, 314 - before dinner: 160s, can increase to 200s >> n/c - after dinner: 110-240 - bedtime: 200s >> same Can have lows. Lowest sugar was 83; she has hypoglycemia awareness at 90. Highest sugar was 200s.   Pt's meals are: - Breakfast: bowl of cereal (rice krispies, lucky charms) with milk 2% - Lunch: PB sandwich - Dinner: pork chop + rice/potatoes + green beans - can/no salads - Snacks: no; smtms apples or bananas She is limited in what she can eat due to her gastroparesis.  - no CKD, last BUN/creatinine:  Lab Results  Component  Value Date   BUN 16 12/20/2013   CREATININE 1.0 12/20/2013  Not an an ACEI. Last ACR 0.3 in 04/2012. - last set of lipids: Lab Results  Component Value Date   CHOL 198 12/20/2013   HDL 69.60 12/20/2013   LDLCALC 103* 12/20/2013   LDLDIRECT 126.8 09/23/2012   TRIG 126.0 12/20/2013   CHOLHDL 3 12/20/2013  Not on a Statin. - last eye exam was in ~2009. No DR. Cannot afford eye exam , does have blurry vision. - + numbness and tingling in her feet.  I reviewed pt's medications, allergies, PMH, social hx, family hx and no changes required, except as mentioned above.  ROS: Constitutional: + all: weight gain/loss, fatigue, subjective hyperthermia/hypothermia, poor sleep Eyes: + blurry vision, no xerophthalmia ENT: no sore throat, no nodules palpated in throat, no dysphagia/odynophagia Cardiovascular: + all: CP/SOB/palpitations/leg swelling Respiratory: no cough/SOB/wheezing Gastrointestinal: + N/+ V/no D/+ C/+ heartburn Musculoskeletal: + both: muscle/joint aches Skin: no rash, no itching Neurological: no tremors/numbness/tingling/dizziness  PE: BP 102/62 mmHg  Pulse 92  Temp(Src) 98.5 F (36.9 C) (Oral)  Resp 12  Wt 246 lb 12.8 oz (111.948 kg)  SpO2 96% Wt Readings from Last 3 Encounters:  04/02/14 246 lb 12.8 oz (111.948 kg)  02/26/14 246 lb  4 oz (111.698 kg)  02/13/14 256 lb (116.121 kg)   Constitutional: obese, in NAD, flat affect Eyes: PERRLA, EOMI, no exophthalmos ENT: moist mucous membranes, no thyromegaly, no cervical lymphadenopathy Cardiovascular: RRR, No MRG Respiratory: CTA B Gastrointestinal: abdomen soft, NT, ND, BS+ Musculoskeletal: no deformities, strength intact in all 4 Skin: moist, warm, no rashes Neurological: no tremor with outstretched hands, DTR normal in all 4  ASSESSMENT: 1. DM2, insulin-dependent, uncontrolled, with complications - gastroparesis - 07/2010 - At 120 minutes, the amount of tracer remaining in the stomach ~39% (nl <30%). Seen by Dr  Sharlett Iles - recommended to start Domperidone a that time >> could not afford.  - PN  2. Gastroparesis  PLAN:  1. Patient with long-standing, recently more uncontrolled diabetes, on a large amount of basal insulin and no mealtime insulin (could not afford pens, is afraid she may be dropping her sugars after NovoLog)  - We discussed about options for treatment, and I suggested that we try Glipizide XL 5 mg daily >> agrees to try:  Patient Instructions  Please decrease Lantus to 35 units at night. Start Glipizide XL 5 mg daily in am. Please return in 3 months with your sugar log.   - continue checking her sugars at different times of the day - check 2-3 times a day, rotating checks  - advised for yearly eye exams >> again advised to call insurance and get her to pay for it as this is not prn - reviewed recent HbA1c level, will check a new one - will give flu vaccine today - Return to clinic in 3 mo with sugar log   Office Visit on 04/02/2014  Component Date Value Ref Range Status  . Hgb A1c MFr Bld 04/02/2014 10.1* 4.6 - 6.5 % Final   Glycemic Control Guidelines for People with Diabetes:Non Diabetic:  <6%Goal of Therapy: <7%Additional Action Suggested:  >8%    HbA1c higher >> see plan above.

## 2014-04-02 NOTE — Patient Instructions (Signed)
Please decrease Lantus to 35 units at night. Start Glipizide XL 5 mg daily in am. Please return in 3 months with your sugar log.

## 2014-04-03 LAB — HEMOGLOBIN A1C: Hgb A1c MFr Bld: 10.1 % — ABNORMAL HIGH (ref 4.6–6.5)

## 2014-04-23 ENCOUNTER — Other Ambulatory Visit: Payer: Self-pay | Admitting: Family Medicine

## 2014-05-09 ENCOUNTER — Ambulatory Visit (INDEPENDENT_AMBULATORY_CARE_PROVIDER_SITE_OTHER): Payer: Managed Care, Other (non HMO) | Admitting: Family Medicine

## 2014-05-09 ENCOUNTER — Encounter: Payer: Self-pay | Admitting: Family Medicine

## 2014-05-09 VITALS — BP 128/76 | HR 94 | Temp 97.6°F | Ht 67.0 in | Wt 256.0 lb

## 2014-05-09 DIAGNOSIS — N390 Urinary tract infection, site not specified: Secondary | ICD-10-CM | POA: Insufficient documentation

## 2014-05-09 DIAGNOSIS — N3000 Acute cystitis without hematuria: Secondary | ICD-10-CM

## 2014-05-09 DIAGNOSIS — R300 Dysuria: Secondary | ICD-10-CM

## 2014-05-09 LAB — POCT URINALYSIS DIPSTICK
BILIRUBIN UA: NEGATIVE
GLUCOSE UA: NEGATIVE
KETONES UA: NEGATIVE
Leukocytes, UA: NEGATIVE
Nitrite, UA: NEGATIVE
Protein, UA: NEGATIVE
Spec Grav, UA: 1.025
UROBILINOGEN UA: 0.2
pH, UA: 6

## 2014-05-09 MED ORDER — CIPROFLOXACIN HCL 250 MG PO TABS: 250.0000 mg | ORAL_TABLET | Freq: Two times a day (BID) | ORAL | Status: DC

## 2014-05-09 MED ORDER — FLUCONAZOLE 150 MG PO TABS: 150.0000 mg | ORAL_TABLET | Freq: Once | ORAL | Status: DC

## 2014-05-09 NOTE — Progress Notes (Signed)
Subjective:    Patient ID: Alexa Woods, female    DOB: [birth year], 54 y.o.   MRN: [id number]  HPI  Here with pain on urination Few days Burns pretty bad to urinate   Frequent urination -- then just has a little volume  No blood in urine  Bladder hurts  Flank area hurts on the L side  Nausea-thinks that is from gastroparesis   No fever   Some vaginal itching on and off - prone to yeast infection    Results for orders placed or performed in visit on 05/09/14  POCT urinalysis dipstick  Result Value Ref Range   Color, UA yellow    Clarity, UA hazy    Glucose, UA neg.    Bilirubin, UA neg.    Ketones, UA neg.    Spec Grav, UA 1.025    Blood, UA Trace    pH, UA 6.0    Protein, UA neg.    Urobilinogen, UA 0.2    Nitrite, UA neg.    Leukocytes, UA Negative       Patient Active Problem List   Diagnosis Date Noted  . Infected sebaceous cyst 02/26/2014  . Joint swelling 12/20/2013  . Leg pain, bilateral 11/28/2013  . Acute bacterial sinusitis 10/17/2013  . Abdominal  pain, other specified site 03/16/2013  . Paresthesia of skin 02/08/2013  . Rash of face 02/08/2013  . Tick bite of back 02/03/2013  . Periodontal disease 02/03/2013  . Hyperlipidemia, mild 09/27/2012  . Unspecified gastritis and gastroduodenitis without mention of hemorrhage 08/02/2012  . Rash of hands 05/23/2012  . Asthma with acute exacerbation 05/23/2012  . 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 Lung density on x-ray 02/23/2011  . Thrombocytopenia 02/20/2011  . DYSPHAGIA UNSPECIFIED 08/07/2010  . Low back pain 07/22/2010  . MORBID OBESITY 03/30/2008  . 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  . Type 2 diabetes mellitus with peripheral neuropathy 11/30/2006  . ANXIETY 11/30/2006  . History of alcohol abuse 11/30/2006  . DEPRESSION 11/30/2006  . HYPERTENSION 11/30/2006  . HEMORRHOIDS 11/30/2006  . ALLERGIC RHINITIS 11/30/2006  . ASTHMA 11/30/2006  . GERD 11/30/2006  . CYSTITIS,  CHRONIC 11/30/2006  . PSORIASIS 11/30/2006   Past Medical History  Diagnosis Date  . Epigastric abdominal pain   . Alcohol abuse, unspecified   . Allergic rhinitis, cause unspecified   . Anxiety state, unspecified   . Unspecified asthma(493.90)   . Backache, unspecified   . Other chronic cystitis   . Depressive disorder, not elsewhere classified   . Type II or unspecified type diabetes mellitus without mention of complication, not stated as uncontrolled   . Dysphagia, unspecified(787.20)   . Esophageal reflux   . Unspecified hemorrhoids without mention of complication   . Herpes zoster without mention of complication   . Unspecified essential hypertension   . Hypopotassemia   . Morbid obesity   . Other screening mammogram   . Pain in joint, lower leg   . Other psoriasis   . History of Bell's palsy 4/09  . Wrist fracture   . Edema   . Gastroparesis   . Myocardial infarction    Past Surgical History  Procedure Laterality Date  . Appendectomy    . Cholecystectomy    . Partial hysterectomy    . Knee arthroscopy w/ partial medial meniscectomy Right 10/2008    and chondroplasty  . Total knee arthroplasty  2011    Dr. Ricki Rodriguez  . Esophagogastroduodenoscopy  07/2002  erythematous gastropathy  . Shoulder surgery  05/2010    Dr. Alphonzo Severance  . Dobutamine stress echo  2009     normal stress echo, no evidence of ischemia  . Mri  11/2102    pt states she has a bone spur on her spine   History  Substance Use Topics  . Smoking status: Former Smoker    Quit date: 12/30/2001  . Smokeless tobacco: Never Used  . Alcohol Use: No     Comment: former alcoholic    Family History  Problem Relation Age of Onset  . Alcohol abuse Mother   . Hypertension Mother   . Esophageal cancer Maternal Grandmother    Allergies  Allergen Reactions  . Bupropion Hcl   . Cefuroxime Axetil Nausea Only  . Codeine     REACTION: nausea and vomiting, rash  . Lidocaine     REACTION: unknown  .  Metformin     REACTION: GI  . Paroxetine     REACTION: doesn't agree  . Propoxyphene N-Acetaminophen     REACTION: wheezing  . Sulfonamide Derivatives     REACTION: rash  . Tramadol Hcl     REACTION: Causes Anxiety   Current Outpatient Prescriptions on File Prior to Visit  Medication Sig Dispense Refill  . albuterol (PROVENTIL HFA;VENTOLIN HFA) 108 (90 BASE) MCG/ACT inhaler Inhale 2 puffs into the lungs every 4 (four) hours as needed for wheezing or shortness of breath. 54 g 0  . doxycycline (VIBRA-TABS) 100 MG tablet Take 1 tablet (100 mg total) by mouth 2 (two) times daily. 20 tablet 0  . glipiZIDE (GLIPIZIDE XL) 5 MG 24 hr tablet Take 1 tablet (5 mg total) by mouth daily with breakfast. 30 tablet 2  . glucose blood (ONE TOUCH ULTRA TEST) test strip Use to test blood sugar 4 times daily as instructed. 125 each 2  . hydrochlorothiazide (HYDRODIURIL) 25 MG tablet TAKE 1 TABLET BY MOUTH DAILY 30 tablet 5  . insulin glargine (LANTUS) 100 UNIT/ML injection Inject 0.3 mLs (30 Units total) into the skin at bedtime. 20 mL 3  . Insulin Pen Needle (LEADER UNIFINE PENTIPS PLUS) 31G X 5 MM MISC Use twice daily as instructed. 100 each 2  . Lancets (ONETOUCH ULTRASOFT) lancets Use to test blood sugar 4 times daily as instructed. 125 each 2  . LORazepam (ATIVAN) 1 MG tablet Take 1 mg by mouth every 8 (eight) hours as needed for anxiety or sleep.     Marland Kitchen omeprazole (PRILOSEC) 20 MG capsule Take 1 capsule (20 mg total) by mouth daily. 90 capsule 0  . omeprazole (PRILOSEC) 20 MG capsule TAKE TWO CAPSULES BY MOUTH DAILY 60 capsule 11  . oxyCODONE-acetaminophen (PERCOCET) 10-325 MG per tablet Take 1 tablet by mouth every 6 (six) hours as needed. For pain     . perphenazine (TRILAFON) 2 MG tablet Take 2 mg by mouth at bedtime.    . potassium chloride (K-DUR,KLOR-CON) 10 MEQ tablet TAKE 1 TABLET BY MOUTH DAILY 30 tablet 5  . sertraline (ZOLOFT) 100 MG tablet Take 200 mg by mouth daily.     Marland Kitchen tiZANidine  (ZANAFLEX) 4 MG tablet Take 4 mg by mouth every 8 (eight) hours as needed. For muscle spasm     . zolpidem (AMBIEN) 10 MG tablet Take 15 mg by mouth at bedtime as needed for sleep.     . [DISCONTINUED] potassium chloride (KLOR-CON) 10 MEQ CR tablet Take 1 tablet (10 mEq total) by mouth daily. 60 tablet  6   No current facility-administered medications on file prior to visit.    Review of Systems Review of Systems  Constitutional: Negative for fever, appetite change, fatigue and unexpected weight change.  Eyes: Negative for pain and visual disturbance.  Respiratory: Negative for cough and shortness of breath.   Cardiovascular: Negative for cp or palpitations    Gastrointestinal: Negative for nausea, diarrhea and constipation.  Genitourinary: pos for urgency and frequency. pos for dysuria and scant vaginal itching, neg for hematuria  Skin: Negative for pallor or rash   Neurological: Negative for weakness, light-headedness, numbness and headaches.  Hematological: Negative for adenopathy. Does not bruise/bleed easily.  Psychiatric/Behavioral: Negative for dysphoric mood. The patient is not nervous/anxious.         Objective:   Physical Exam  Constitutional: She appears well-developed and well-nourished. No distress.  HENT:  Head: Normocephalic.  Eyes: Conjunctivae and EOM are normal. Pupils are equal, round, and reactive to light.  Neck: Normal range of motion. Neck supple.  Cardiovascular: Regular rhythm and normal heart sounds.   Pulmonary/Chest: Effort normal and breath sounds normal. No respiratory distress. She has no wheezes. She has no rales.  Abdominal: Soft. Bowel sounds are normal. She exhibits no distension. There is no hepatosplenomegaly. There is tenderness in the suprapubic area. There is CVA tenderness. There is no rebound and no guarding.  Mild L cva tenderness   Genitourinary: Vagina normal.  Ext genitalia is nl w/o redness/d/c or lesions  Lymphadenopathy:    She has no  cervical adenopathy.  Neurological: She is alert. She has normal reflexes.  Skin: Skin is warm and dry. No rash noted. No erythema.  Psychiatric: She has a normal mood and affect.          Assessment & Plan:   Problem List Items Addressed This Visit      Genitourinary   UTI (urinary tract infection) - Primary    Pt has uti symptoms in the setting of chronic utis and chronic bladder pain  Cover with short course of cipro Enc fluids  Sent for cx  Also diflucan for yeast     Relevant Medications      fluconazole (DIFLUCAN) tablet 150 mg   Other Relevant Orders      Urine culture (Completed)    Other Visit Diagnoses    Dysuria        Relevant Orders       POCT urinalysis dipstick (Completed)

## 2014-05-09 NOTE — Progress Notes (Signed)
Pre visit review using our clinic review tool, if applicable. No additional management support is needed unless otherwise documented below in the visit note. 

## 2014-05-09 NOTE — Patient Instructions (Signed)
Drink a lot of fluids/water  Take the cipro as directed Take diflucan for yeast  We will call with urine culture result

## 2014-05-11 LAB — URINE CULTURE
COLONY COUNT: NO GROWTH
ORGANISM ID, BACTERIA: NO GROWTH

## 2014-05-13 ENCOUNTER — Telehealth: Payer: Self-pay | Admitting: Family Medicine

## 2014-05-13 NOTE — Telephone Encounter (Signed)
Opened in error

## 2014-05-13 NOTE — Assessment & Plan Note (Signed)
Pt has uti symptoms in the setting of chronic utis and chronic bladder pain  Cover with short course of cipro Enc fluids  Sent for cx  Also diflucan for yeast

## 2014-05-14 ENCOUNTER — Telehealth: Payer: Self-pay | Admitting: *Deleted

## 2014-05-14 NOTE — Telephone Encounter (Signed)
When called pt about her urinary sxs she advise me that her sxs have resolved but she is having really bad pain under her left rib from her gastroparesis, pt said she asked you about it at her last visit and you advise her to talk to Dr. Cruzita Lederer about it. Pt said she spoke to Dr. Cruzita Lederer about it and she advise her to f/u with you. Pt isn't sure what to do but she is in a lot of pain

## 2014-05-15 MED ORDER — METOCLOPRAMIDE HCL 5 MG/5ML PO SOLN: 5.0000 mg | Freq: Three times a day (TID) | ORAL | Status: DC

## 2014-05-15 NOTE — Telephone Encounter (Signed)
I want to try generic reglan This med can cause sedation and also be on the look out for tics or involuntary movements -stop it if this occurs Take it 5 ml before meals three times per day  We can start with a 10 day course and then have her let me know how it goes- if symptoms worsen let me know  My hope is that we can cut it to as needed -but take on schedule for 10 d first to see if it helps Px written for call in

## 2014-05-16 NOTE — Telephone Encounter (Signed)
Pt said she has tried reglan before and she did have to stop it because it didn't work and she did develop tics/involuntary movements. Rx wasn't called into pharmacy. Pt wanted to know since she shouldn't try reglan what would be the next step, please advise

## 2014-05-17 MED ORDER — ERYTHROMYCIN BASE 250 MG PO TABS: 250.0000 mg | ORAL_TABLET | Freq: Three times a day (TID) | ORAL | Status: DC

## 2014-05-17 NOTE — Telephone Encounter (Signed)
Erythromycin helps some people - take 250 mg pill before meals - let me know if this does not help  If symptoms worsen we may consider GI visit   Please call in

## 2014-05-17 NOTE — Telephone Encounter (Signed)
Left voicemail requesting pt to call office back 

## 2014-05-18 NOTE — Telephone Encounter (Signed)
Left voicemail requesting pt to call office 

## 2014-05-18 NOTE — Telephone Encounter (Signed)
Pt called and notified as instructed;med called to [facility] as instructed.

## 2014-06-27 ENCOUNTER — Encounter: Payer: Self-pay | Admitting: Family Medicine

## 2014-06-27 ENCOUNTER — Ambulatory Visit (INDEPENDENT_AMBULATORY_CARE_PROVIDER_SITE_OTHER): Payer: Managed Care, Other (non HMO) | Admitting: Family Medicine

## 2014-06-27 VITALS — BP 124/80 | HR 72 | Temp 98.4°F | Wt 247.5 lb

## 2014-06-27 DIAGNOSIS — J019 Acute sinusitis, unspecified: Secondary | ICD-10-CM

## 2014-06-27 DIAGNOSIS — B9689 Other specified bacterial agents as the cause of diseases classified elsewhere: Secondary | ICD-10-CM

## 2014-06-27 MED ORDER — AMOXICILLIN-POT CLAVULANATE 875-125 MG PO TABS: 1.0000 | ORAL_TABLET | Freq: Two times a day (BID) | ORAL | Status: DC

## 2014-06-27 MED ORDER — HYDROCODONE-HOMATROPINE 5-1.5 MG/5ML PO SYRP: 5.0000 mL | ORAL_SOLUTION | Freq: Three times a day (TID) | ORAL | Status: DC | PRN

## 2014-06-27 NOTE — Assessment & Plan Note (Signed)
Augmentin, hycodan with routine cautions,supportive care o/w and f/u prn. Okay for outpatient f/u.  She has f/u with endo pending re: DM2.

## 2014-06-27 NOTE — Progress Notes (Signed)
Pre visit review using our clinic review tool, if applicable. No additional management support is needed unless otherwise documented below in the visit note.  H/o DM2 with elevated A1c.  Sugar has usually been >160 in AM.    Sx started about 1 week, worse in the meantime.  ST, cough, chest sore from coughing, felt feverish, ear pain.  No vomiting.  Some nausea.  No diarrhea.  No rash.  Minimal sputum.  Sinus pain, maxillary area B.   She reports being able to take hycodan cough syrup w/o troubles or ADE.  Meds, vitals, and allergies reviewed.   ROS: See HPI.  Otherwise, noncontributory.  GEN: nad, alert and oriented HEENT: mucous membranes moist, tm w/o erythema, nasal exam w/o erythema, clear discharge noted,  OP with cobblestoning, max sinuses ttp NECK: supple w/o LA CV: rrr.   PULM: ctab, no inc wob EXT: no edema SKIN: no acute rash

## 2014-06-27 NOTE — Patient Instructions (Signed)
Start augmentin today and use the cough syrup as needed.  Take care.  Try to get some rest.  Glad to see you.

## 2014-07-03 ENCOUNTER — Ambulatory Visit (INDEPENDENT_AMBULATORY_CARE_PROVIDER_SITE_OTHER): Payer: Managed Care, Other (non HMO) | Admitting: Internal Medicine

## 2014-07-03 ENCOUNTER — Encounter: Payer: Self-pay | Admitting: Internal Medicine

## 2014-07-03 VITALS — BP 106/68 | HR 89 | Temp 97.6°F | Resp 12 | Wt 248.0 lb

## 2014-07-03 DIAGNOSIS — E1140 Type 2 diabetes mellitus with diabetic neuropathy, unspecified: Secondary | ICD-10-CM

## 2014-07-03 DIAGNOSIS — E1142 Type 2 diabetes mellitus with diabetic polyneuropathy: Secondary | ICD-10-CM

## 2014-07-03 MED ORDER — INSULIN GLARGINE 100 UNIT/ML ~~LOC~~ SOLN
35.0000 [IU] | Freq: Every day | SUBCUTANEOUS | Status: DC
Start: 2014-07-03 — End: 2014-10-15

## 2014-07-03 MED ORDER — GLIPIZIDE ER 5 MG PO TB24: 10.0000 mg | ORAL_TABLET | Freq: Every day | ORAL | Status: DC

## 2014-07-03 NOTE — Patient Instructions (Signed)
Please continue Lantus 35 units daily. Try to take it every day! Please increase Glipizide XL to 10 mg in am.  Please return in 1.5 month with your sugar log.   Please stop at the lab.

## 2014-07-03 NOTE — Progress Notes (Signed)
Patient ID: Alexa Woods, female   DOB: 11/02/1960, 54 y.o.   MRN: [id number]  HPI: Alexa Woods is a 54 y.o.-year-old female, returning for f/u for DM2 dx 1997, insulin-dependent since 2010, uncontrolled, with  complications (gastroparesis - dx 2012, PN). Last visit 3 mo ago.  She has an URI >> ear pain, sore throat and cough. She has chills.  Last hemoglobin A1c was: Lab Results  Component Value Date   HGBA1C 10.1* 04/02/2014   HGBA1C 9.9* 12/20/2013   HGBA1C 9.5* 03/08/2013   I advised her to use: - Lantus 30 units at night  - Novolog 7 units with breakfast, lunch and dinner. Please take 9 units for a large meal. - Following Sliding scale - add to the above doses: - 150-175: + 1 unit  - 176-200: + 2 units  - 201-225: + 3 units  - 226-250: + 4 units  - 251-275: + 5 units - 276-300: + 6 units Could not tolerate Metformin >> GI upset. (N+V) Tried Januvia >> nausea.  At last visit, she was only using (could not afford the insulin pens): - Lantus 45 units at night >> 40 units - forgets or has to ration it - skips it ~ 2x a week - no NovoLog - Glipizide XL 5 mg daily in am (added 04/2014)  Pt checks her sugars 1-2x day and they are - brings log: - am: 90-190, but usually ~120 >> 110-150 >> 83-157 >> 123, 141-205, 260 - after b'fast: 193, 200 ? >> n/c - before lunch: 170s >> n/c >> 134-162, 193 - after lunch: 87, 99, 115-165, 266, 314 >> n/c - before dinner: 160s, can increase to 200s >> n/c >> 133-209, 220 - after dinner: 110-240 >> 163-257 - bedtime: 200s >> 116-230 Can have lows. Lowest sugar was 123; she has hypoglycemia awareness at 90. Highest sugar was 250s.   Pt's meals are: - Breakfast: bowl of cereal (rice krispies, lucky charms) with milk 2% - Lunch: PB sandwich - Dinner: pork chop + rice/potatoes + green beans - can/no salads - Snacks: no; smtms apples or bananas She is limited in what she can eat due to her gastroparesis.  - no CKD, last BUN/creatinine:   Lab Results  Component Value Date   BUN 16 12/20/2013   CREATININE 1.0 12/20/2013  Not an an ACEI. Last ACR 0.3 in 04/2012. - last set of lipids: Lab Results  Component Value Date   CHOL 198 12/20/2013   HDL 69.60 12/20/2013   LDLCALC 103* 12/20/2013   LDLDIRECT 126.8 09/23/2012   TRIG 126.0 12/20/2013   CHOLHDL 3 12/20/2013  Not on a Statin. - last eye exam was in ~2009. No DR. Cannot afford eye exam , does have blurry vision. - + numbness and tingling in her feet.  I reviewed pt's medications, allergies, PMH, social hx, family hx, and changes were documented in the history of present illness. Otherwise, unchanged from my initial visit note.  ROS: Constitutional:no weight gain/loss, + fatigue, subjective hyperthermia/hypothermia, poor sleep Eyes: + blurry vision, no xerophthalmia ENT: + sore throat, no nodules palpated in throat, + dysphagia/no odynophagia, + tinnitus, + ear pain, + congestion, + hoarseness Cardiovascular: no CP/SOB/palpitations/leg swelling Respiratory: + all: cough/SOB/wheezing Gastrointestinal: + N/no V/no D/+ C/+ heartburn Musculoskeletal: + both: muscle/joint aches Skin: no rash, no itching Neurological: no tremors/numbness/tingling/dizziness, + HA  PE: BP 106/68 mmHg  Pulse 89  Temp(Src) 97.6 F (36.4 C) (Oral)  Resp 12  Wt 248 lb (112.492  kg)  SpO2 96% Body mass index is 38.83 kg/(m^2). Wt Readings from Last 3 Encounters:  07/03/14 248 lb (112.492 kg)  06/27/14 247 lb 8 oz (112.265 kg)  05/09/14 256 lb (116.121 kg)   Constitutional: obese, in NAD Eyes: PERRLA, EOMI, no exophthalmos ENT: moist mucous membranes, no thyromegaly, no cervical lymphadenopathy Cardiovascular: RRR, No MRG Respiratory: CTA B Gastrointestinal: abdomen soft, NT, ND, BS+ Musculoskeletal: no deformities, strength intact in all 4 Skin: moist, warm, no rashes Neurological: no tremor with outstretched hands, DTR normal in all 4  ASSESSMENT: 1. DM2, insulin-dependent,  uncontrolled, with complications - gastroparesis - 07/2010 - At 120 minutes, the amount of tracer remaining in the stomach ~39% (nl <30%). Seen by Dr Sharlett Iles - recommended to start Domperidone a that time >> could not afford.  - PN  PLAN:  1. Patient with long-standing, uncontrolled diabetes, on basal insulin and Glipizide. Sugars high, but she is rationing Lantus. She does not want to switch to a cheaper alternative (NPH)! - We discussed about taking Lantus every day (she wants to try to take it every day) and increase Glipizide Patient Instructions  Please continue Lantus 35 units at night. Increase Glipizide XL to 10 mg daily in am. Please return in 1.5 months with your sugar log.   - continue checking her sugars at different times of the day - check 2-3 times a day, rotating checks  - advised for yearly eye exams - she is due - reviewed recent HbA1c level, will check a new one - had flu vaccine this season - Return to clinic in 1.5 mo with sugar log   Office Visit on 07/03/2014  Component Date Value Ref Range Status  . Hgb A1c MFr Bld 07/03/2014 9.5* 4.6 - 6.5 % Final   Glycemic Control Guidelines for People with Diabetes:Non Diabetic:  <6%Goal of Therapy: <7%Additional Action Suggested:  >8%   A1c improved.

## 2014-07-04 LAB — HEMOGLOBIN A1C: Hgb A1c MFr Bld: 9.5 % — ABNORMAL HIGH (ref 4.6–6.5)

## 2014-07-19 ENCOUNTER — Telehealth: Payer: Self-pay

## 2014-07-19 DIAGNOSIS — K2990 Gastroduodenitis, unspecified, without bleeding: Secondary | ICD-10-CM

## 2014-07-19 DIAGNOSIS — R1012 Left upper quadrant pain: Secondary | ICD-10-CM | POA: Insufficient documentation

## 2014-07-19 DIAGNOSIS — K2970 Gastritis, unspecified, without bleeding: Secondary | ICD-10-CM

## 2014-07-19 NOTE — Telephone Encounter (Signed)
Referral done

## 2014-07-19 NOTE — Telephone Encounter (Signed)
Pt left v/m; pt was seen 07/03/14, pt request referral to GI due to pain in lt rib area. Pt said pain is worse after she eats; pain level after eats is a 10. pt knows she has gastorparesis.pt request cb.

## 2014-07-20 NOTE — Telephone Encounter (Signed)
Pt returned your call. please call back. Thanks

## 2014-07-20 NOTE — Telephone Encounter (Signed)
No one has contacted this pt to my knowledge, but she does have a pending GI appt

## 2014-07-23 ENCOUNTER — Ambulatory Visit (INDEPENDENT_AMBULATORY_CARE_PROVIDER_SITE_OTHER): Payer: Managed Care, Other (non HMO) | Admitting: Physician Assistant

## 2014-07-23 ENCOUNTER — Encounter: Payer: Self-pay | Admitting: Physician Assistant

## 2014-07-23 VITALS — BP 150/82 | HR 66 | Ht 65.5 in | Wt 251.4 lb

## 2014-07-23 DIAGNOSIS — K5900 Constipation, unspecified: Secondary | ICD-10-CM

## 2014-07-23 DIAGNOSIS — K3184 Gastroparesis: Secondary | ICD-10-CM

## 2014-07-23 DIAGNOSIS — R1310 Dysphagia, unspecified: Secondary | ICD-10-CM

## 2014-07-23 DIAGNOSIS — K219 Gastro-esophageal reflux disease without esophagitis: Secondary | ICD-10-CM

## 2014-07-23 MED ORDER — DICYCLOMINE HCL 10 MG PO CAPS: 10.0000 mg | ORAL_CAPSULE | Freq: Three times a day (TID) | ORAL | Status: DC | PRN

## 2014-07-23 MED ORDER — PANTOPRAZOLE SODIUM 40 MG PO TBEC: 40.0000 mg | DELAYED_RELEASE_TABLET | Freq: Two times a day (BID) | ORAL | Status: DC

## 2014-07-23 NOTE — Progress Notes (Signed)
Agree with initial assessment and plans 

## 2014-07-23 NOTE — Patient Instructions (Addendum)
You have been scheduled for an endoscopy. Please follow written instructions given to you at your visit today. If you use inhalers (even only as needed), please bring them with you on the day of your procedure. Your physician has requested that you go to www.startemmi.com and enter the access code given to you at your visit today. This web site gives a general overview about your procedure. However, you should still follow specific instructions given to you by our office regarding your preparation for the procedure.  Please discontinue omeprazole.  We have sent the following medications to your pharmacy for you to pick up at your convenience: Pantoprazole 40 mg twice daily (in place of omeprazole) Bentyl 10 mg three times daily as needed  Please purchase the following medications over the counter and take as directed: Miralax-Take 1-2 capfuls dissolved in at least 8 ounces water/juice daily.  Make certain to increase water in your diet!!!!  CC:Dr [employer]

## 2014-07-23 NOTE — Progress Notes (Signed)
Patient ID: Alexa Woods, female   DOB: 1960/09/27, 54 y.o.   MRN: [id number]    HPI:   Alexa Woods is a pleasant 54 year old female referred for evaluation by Dr. Glori Bickers due to constipation, dysphagia, and left upper quadrant abdominal pain.  Alexa Woods is an insulin-dependent diabetic who had been followed in the past by Dr. Sharlett Iles for her generalized GI motility disorder. She had a gastric emptying scan in 2012 that showed 40% retention at 2 hours. In the past she's reportedly had neurologic side effects with Reglan. She had been prescribed domperidone in the past however she did not want to have to pay for her medication so she never filled it. She had had an endoscopy in 2012 with [facility] dilation. The EGD was normal except for a probable stricture. Biopsies for H. pylori and celiac disease were negative. She had also been seen by Dr. Sharlett Iles in the past for constipation and was instructed to use Mira lax which she did on occasion but not regularly due to the cost.  Over the past several months, she has continued to be troubled with constipation. She has a bowel movement every second or third day and says her stools are usually hard and she has to strain. She has also been experiencing left upper quadrant abdominal pain that is worse after meals and is somewhat alleviated with passage of gas or defecation. She has had no bright red blood per rectum or melena. Her appetite has been good and her weight has been stable. She has been having heartburn on a daily basis despite using omeprazole. She is also having difficulty swallowing solids again. Over the past several months she has had episodes where she has had to spit her food up because it will not go down. She has not had dysphagia to liquids. She is complaining of early satiety and feels the food does not move out of her stomach.  Recently over the past  12 months she has had 2 visits to the [facility] regional emergency room for abdominal pain. Diagnosed  with constipation and advised to use lactulose however she did not fill the prescription she was also prescribed Bentyl which she did not fill she was also prescribed Zofran which she does not remember if she filled. She had a CT of the abdomen and pelvis done on December 24 of 2014 which had no abnormality to explain her discomfort.    Past Medical History  Diagnosis Date  . Epigastric abdominal pain   . Alcohol abuse, unspecified   . Allergic rhinitis, cause unspecified   . Anxiety state, unspecified   . Unspecified asthma(493.90)   . Backache, unspecified   . Other chronic cystitis   . Depressive disorder, not elsewhere classified   . Type II or unspecified type diabetes mellitus without mention of complication, not stated as uncontrolled   . Dysphagia, unspecified(787.20)   . Esophageal reflux   . Unspecified hemorrhoids without mention of complication   . Herpes zoster without mention of complication   . Unspecified essential hypertension   . Hypopotassemia   . Morbid obesity   . Other screening mammogram   . Pain in joint, lower leg   . Other psoriasis   . History of Bell's palsy 4/09  . Wrist fracture   . Edema   . Gastroparesis   . Myocardial infarction     Past Surgical History  Procedure Laterality Date  . Appendectomy    . Cholecystectomy    . Partial hysterectomy    .  Knee arthroscopy w/ partial medial meniscectomy Right 10/2008    and chondroplasty  . Total knee arthroplasty  2011    Dr. Ricki Rodriguez  . Esophagogastroduodenoscopy  07/2002    erythematous gastropathy  . Shoulder surgery Left 05/2010    Dr. Alphonzo Severance  . Dobutamine stress echo  2009     normal stress echo, no evidence of ischemia  . Mri  11/2102    pt states she has a bone spur on her spine  . Elbow surgery Right     for nerve damage   Family History  Problem Relation Age of Onset  . Alcohol abuse Mother   . Hypertension Mother   . Esophageal cancer Maternal Grandmother   . Colon cancer Neg  Hx   . Lung cancer      mat great uncle   History  Substance Use Topics  . Smoking status: Former Smoker    Quit date: 12/30/2001  . Smokeless tobacco: Never Used  . Alcohol Use: No     Comment: former alcoholic    Current Outpatient Prescriptions  Medication Sig Dispense Refill  . albuterol (PROVENTIL HFA;VENTOLIN HFA) 108 (90 BASE) MCG/ACT inhaler Inhale 2 puffs into the lungs every 4 (four) hours as needed for wheezing or shortness of breath. 54 g 0  . glipiZIDE (GLIPIZIDE XL) 5 MG 24 hr tablet Take 2 tablets (10 mg total) by mouth daily with breakfast. 60 tablet 2  . glucose blood (ONE TOUCH ULTRA TEST) test strip Use to test blood sugar 4 times daily as instructed. 125 each 2  . hydrochlorothiazide (HYDRODIURIL) 25 MG tablet TAKE 1 TABLET BY MOUTH DAILY 30 tablet 5  . HYDROcodone-homatropine (HYCODAN) 5-1.5 MG/5ML syrup Take 5 mLs by mouth every 8 (eight) hours as needed for cough. 120 mL 0  . insulin glargine (LANTUS) 100 UNIT/ML injection Inject 0.35 mLs (35 Units total) into the skin at bedtime. 20 mL 2  . Insulin Pen Needle (LEADER UNIFINE PENTIPS PLUS) 31G X 5 MM MISC Use twice daily as instructed. 100 each 2  . Lancets (ONETOUCH ULTRASOFT) lancets Use to test blood sugar 4 times daily as instructed. 125 each 2  . LORazepam (ATIVAN) 1 MG tablet Take 1 mg by mouth every 8 (eight) hours as needed for anxiety or sleep.     Marland Kitchen oxyCODONE-acetaminophen (PERCOCET) 10-325 MG per tablet Take 1 tablet by mouth every 6 (six) hours as needed. For pain     . perphenazine (TRILAFON) 2 MG tablet Take 2 mg by mouth at bedtime.    . potassium chloride (K-DUR,KLOR-CON) 10 MEQ tablet TAKE 1 TABLET BY MOUTH DAILY 30 tablet 5  . sertraline (ZOLOFT) 100 MG tablet Take 200 mg by mouth daily.     Marland Kitchen zolpidem (AMBIEN) 10 MG tablet Take 15 mg by mouth at bedtime as needed for sleep.     Marland Kitchen dicyclomine (BENTYL) 10 MG capsule Take 1 capsule (10 mg total) by mouth 3 (three) times daily as needed for spasms. 90  capsule 1  . pantoprazole (PROTONIX) 40 MG tablet Take 1 tablet (40 mg total) by mouth 2 (two) times daily. 60 tablet 1  . [DISCONTINUED] potassium chloride (KLOR-CON) 10 MEQ CR tablet Take 1 tablet (10 mEq total) by mouth daily. 60 tablet 6   No current facility-administered medications for this visit.   Allergies  Allergen Reactions  . Bupropion Hcl   . Cefuroxime Axetil Nausea Only  . Codeine     REACTION: nausea and vomiting, rash  .  Lidocaine     REACTION: unknown  . Metformin     REACTION: GI  . Paroxetine     REACTION: doesn't agree  . Propoxyphene N-Acetaminophen     REACTION: wheezing  . Sulfonamide Derivatives     REACTION: rash  . Tramadol Hcl     REACTION: Causes Anxiety     Review of Systems: Gen: Denies any fever, chills, sweats, anorexia, fatigue, weakness, malaise, weight loss, and sleep disorder CV: Denies chest pain, angina, palpitations, syncope, orthopnea, PND, peripheral edema, and claudication. Resp: Denies dyspnea at rest, dyspnea with exercise, cough, sputum, wheezing, coughing up blood, and pleurisy. GI: Denies vomiting blood, jaundice, and fecal incontinence.  Has dysphagia to solids. GU : Denies urinary burning, blood in urine, urinary frequency, urinary hesitancy, nocturnal urination, and urinary incontinence. MS: Denies joint pain, limitation of movement, and swelling, stiffness, low back pain, extremity pain. Denies muscle weakness, cramps, atrophy.  Derm: Denies rash, itching, dry skin, hives, moles, warts, or unhealing ulcers.  Psych: Denies depression, anxiety, memory loss, suicidal ideation, hallucinations, paranoia, and confusion. Heme: Denies bruising, bleeding, and enlarged lymph nodes. Neuro:  Denies any headaches, dizziness, paresthesias. Endo:  Denies any problems with DM, thyroid, adrenal function  LABS: Hemoglobin A1c on 07/03/2014 was 9.5.     Studies:  CT of the abdomen and pelvis with contrast on 05/27/2013 at [facility] showed no  acute abnormality seen to explain patient's symptoms. Likely small bilateral renal cyst again seen, measuring 2.0 cm on each side. The upper pole cyst at the left kidney demonstrates slightly higher than expected attenuation, without evidence of a solid mass.  Prior Endoscopies:   EGD  September 03, 2010 by Dr. Sharlett Iles normal esophagus, normal EGD, probable stricture. Colonoscopy 09/03/2010 was a normal colonoscopy. Physical Exam: BP 150/82 mmHg  Pulse 66  Ht 5' 5.5" (1.664 m)  Wt 251 lb 6.4 oz (114.034 kg)  BMI 41.18 kg/m2 Constitutional: Pleasant,well-developed female in no acute distress. HEENT: Normocephalic and atraumatic. Conjunctivae are normal. No scleral icterus. Neck supple. No adenopathy Cardiovascular: Normal rate, regular rhythm.  Pulmonary/chest: Effort normal and breath sounds normal. No wheezing, rales or rhonchi. Abdominal: Soft, nondistended, nontender. Bowel sounds active throughout. There are no masses palpable. No hepatomegaly. Extremities: no edema Lymphadenopathy: No cervical adenopathy noted. Neurological: Alert and oriented to person place and time. Skin: Skin is warm and dry. No rashes noted. Psychiatric: Normal mood and affect. Behavior is normal.  ASSESSMENT AND PLAN: 54 year old female with insulin-dependent diabetes with poor glycemic control at this time who continues to be troubled with gastroparesis. Her delayed gastric emptying is likely exacerbating her GERD. An anti-reflux regimen has been reviewed and she will be given a trial of a gastroparesis diet. She will discontinue omeprazole and be given a trial of pantoprazole 40 mg twice a day. As she has been experiencing progressive dysphagia she will be scheduled for an EGD with possible dilation.The risks, benefits, and alternatives to endoscopy with possible biopsy and possible dilation were discussed with the patient and they consent to proceed. Pending the findings of her EGD, she may be again considered for a  trial of domperidone, but the patient states she likely will not be able to afford this medication.  With regards to her constipation, she readily admits that her diet is poor. She's been advised to add fiber to her diet and increase her water intake she will be given a trial of Mira lax one to 2 capfuls in water daily. She has been instructed  to titrate the amount of Mira lax she uses to achieve the desired effect. If this does not provide any relief, she may be considered for a trial of Amitiza or Linzess.    Alexa Woods, Deloris Ping 07/23/2014, 10:54 AM    CC:Dr. Glori Bickers

## 2014-07-24 ENCOUNTER — Encounter: Payer: Self-pay | Admitting: Internal Medicine

## 2014-07-24 ENCOUNTER — Ambulatory Visit (AMBULATORY_SURGERY_CENTER): Payer: Managed Care, Other (non HMO) | Admitting: Internal Medicine

## 2014-07-24 VITALS — BP 145/87 | HR 69 | Temp 98.6°F | Resp 17 | Ht 65.0 in | Wt 251.0 lb

## 2014-07-24 DIAGNOSIS — R1310 Dysphagia, unspecified: Secondary | ICD-10-CM

## 2014-07-24 DIAGNOSIS — K219 Gastro-esophageal reflux disease without esophagitis: Secondary | ICD-10-CM

## 2014-07-24 DIAGNOSIS — K3184 Gastroparesis: Secondary | ICD-10-CM

## 2014-07-24 LAB — GLUCOSE, CAPILLARY
GLUCOSE-CAPILLARY: 131 mg/dL — AB (ref 70–99)
Glucose-Capillary: 136 mg/dL — ABNORMAL HIGH (ref 70–99)

## 2014-07-24 MED ORDER — SODIUM CHLORIDE 0.9 % IV SOLN: 500.0000 mL | INTRAVENOUS | Status: DC

## 2014-07-24 NOTE — Patient Instructions (Signed)
Discharge instructions given. Handout on GERD. Resume previous medications. YOU HAD AN ENDOSCOPIC PROCEDURE TODAY AT [facility]: Refer to the procedure report that was given to you for any specific questions about what was found during the examination.  If the procedure report does not answer your questions, please call your gastroenterologist to clarify.  If you requested that your care partner not be given the details of your procedure findings, then the procedure report has been included in a sealed envelope for you to review at your convenience later.  YOU SHOULD EXPECT: Some feelings of bloating in the abdomen. Passage of more gas than usual.  Walking can help get rid of the air that was put into your GI tract during the procedure and reduce the bloating. If you had a lower endoscopy (such as a colonoscopy or flexible sigmoidoscopy) you may notice spotting of blood in your stool or on the toilet paper. If you underwent a bowel prep for your procedure, then you may not have a normal bowel movement for a few days.  DIET: Your first meal following the procedure should be a light meal and then it is ok to progress to your normal diet.  A half-sandwich or bowl of soup is an example of a good first meal.  Heavy or fried foods are harder to digest and may make you feel nauseous or bloated.  Likewise meals heavy in dairy and vegetables can cause extra gas to form and this can also increase the bloating.  Drink plenty of fluids but you should avoid alcoholic beverages for 24 hours.  ACTIVITY: Your care partner should take you home directly after the procedure.  You should plan to take it easy, moving slowly for the rest of the day.  You can resume normal activity the day after the procedure however you should NOT DRIVE or use heavy machinery for 24 hours (because of the sedation medicines used during the test).    SYMPTOMS TO REPORT IMMEDIATELY: A gastroenterologist can be reached at any  hour.  During normal business hours, 8:30 AM to 5:00 PM Monday through Friday, call [phone].  After hours and on weekends, please call the GI answering service at [phone] who will take a message and have the physician on call contact you.   Following upper endoscopy (EGD)  Vomiting of blood or coffee ground material  New chest pain or pain under the shoulder blades  Painful or persistently difficult swallowing  New shortness of breath  Fever of 100F or higher  Black, tarry-looking stools  FOLLOW UP: If any biopsies were taken you will be contacted by phone or by letter within the next 1-3 weeks.  Call your gastroenterologist if you have not heard about the biopsies in 3 weeks.  Our staff will call the home number listed on your records the next business day following your procedure to check on you and address any questions or concerns that you may have at that time regarding the information given to you following your procedure. This is a courtesy call and so if there is no answer at the home number and we have not heard from you through the emergency physician on call, we will assume that you have returned to your regular daily activities without incident.  SIGNATURES/CONFIDENTIALITY: You and/or your care partner have signed paperwork which will be entered into your electronic medical record.  These signatures attest to the fact that that the information above on your After Visit Summary  has been reviewed and is understood.  Full responsibility of the confidentiality of this discharge information lies with you and/or your care-partner. 

## 2014-07-24 NOTE — Op Note (Signed)
[place]  [address] [place], [address]   ENDOSCOPY PROCEDURE REPORT  PATIENT: Alexa, Woods  MR#: [id number] BIRTHDATE: 06-10-1960 , 53  yrs. old GENDER: female ENDOSCOPIST: Eustace Quail, MD REFERRED BY:  Abner Greenspan, M.D. PROCEDURE DATE:  07/24/2014 PROCEDURE:  EGD, diagnostic ASA CLASS:     Class III INDICATIONS:  history of esophageal reflux and dysphagia. MEDICATIONS: Monitored anesthesia care and Propofol 200 mg IV TOPICAL ANESTHETIC: none  DESCRIPTION OF PROCEDURE: After the risks benefits and alternatives of the procedure were thoroughly explained, informed consent was obtained.  The LB [id number] endoscope was introduced through the mouth and advanced to the second portion of the duodenum , Without limitations.  The instrument was slowly withdrawn as the mucosa was fully examined.    EXAM: The esophagus and gastroesophageal junction were completely normal in appearance.  The stomach was entered and closely examined, and appeared normal.The antrum, angularis, and lesser curvature were well visualized, including a retroflexed view of the cardia and fundus.  The stomach wall was normally distensable.  The scope passed easily through the pylorus into the duodenum, which was also normal.  Retroflexed views revealed no abnormalities. The scope was then withdrawn from the patient and the procedure completed.  COMPLICATIONS: There were no immediate complications.  ENDOSCOPIC IMPRESSION: 1. GERD 2. Atypical dysphagia. No significant esophageal abnormality  RECOMMENDATIONS: 1.  Anti-reflux regimen to be followed. Weight loss and better control of your diabetes is very important 2.  If you are having active reflux symptoms such as heartburn, begin recently prescribed pantoprazole 3. Return to the care of Dr. Glori Bickers  REPEAT EXAM:  eSigned:  Eustace Quail, MD 07/24/2014 3:39 PM    FK:CLEXN [name], MD and The Patient

## 2014-07-24 NOTE — Progress Notes (Signed)
Patient awakening,vss,report to rn 

## 2014-07-25 ENCOUNTER — Telehealth: Payer: Self-pay

## 2014-07-25 NOTE — Telephone Encounter (Signed)
  Follow up Call-  Call back number 07/24/2014  Post procedure Call Back phone  # [phone]  Permission to leave phone message Yes     Patient questions:  Do you have a fever, pain , or abdominal swelling? No. Pain Score  0 *  Have you tolerated food without any problems? Yes.    Have you been able to return to your normal activities? Yes.    Do you have any questions about your discharge instructions: Diet   No. Medications  No. Follow up visit  No.  Do you have questions or concerns about your Care? No.  Actions: * If pain score is 4 or above: No action needed, pain <4. Patient stated to lower abdomin. Pain was there prior to procedure.

## 2014-08-14 ENCOUNTER — Encounter: Payer: Self-pay | Admitting: Internal Medicine

## 2014-08-14 ENCOUNTER — Ambulatory Visit (INDEPENDENT_AMBULATORY_CARE_PROVIDER_SITE_OTHER): Payer: Managed Care, Other (non HMO) | Admitting: Internal Medicine

## 2014-08-14 VITALS — BP 122/76 | HR 79 | Temp 98.5°F | Resp 12 | Wt 247.4 lb

## 2014-08-14 DIAGNOSIS — E1142 Type 2 diabetes mellitus with diabetic polyneuropathy: Secondary | ICD-10-CM

## 2014-08-14 DIAGNOSIS — E1140 Type 2 diabetes mellitus with diabetic neuropathy, unspecified: Secondary | ICD-10-CM

## 2014-08-14 NOTE — Progress Notes (Signed)
Patient ID: Alexa Woods, female   DOB: [birth year], 54 y.o.   MRN: [id number]  Alexa: AIDYNN Woods is a 54 y.o.-year-old female, returning for f/u for DM2 dx 1997, insulin-dependent since 2010, uncontrolled, with  complications (gastroparesis - dx 2012, PN). Last visit 1.5 mo ago.  Last hemoglobin A1c was: Lab Results  Component Value Date   HGBA1C 9.5* 07/03/2014   HGBA1C 10.1* 04/02/2014   HGBA1C 9.9* 12/20/2013   I advised her to use: - Lantus 30 units at night  - Novolog 7 units with breakfast, lunch and dinner. Please take 9 units for a large meal. - Following Sliding scale - add to the above doses: - 150-175: + 1 unit  - 176-200: + 2 units  - 201-225: + 3 units  - 226-250: + 4 units  - 251-275: + 5 units - 276-300: + 6 units Could not tolerate Metformin >> GI upset. (N+V) Tried Januvia >> nausea.  She was only using (could not afford the insulin pens): - Lantus 45 units at night >> 40 units >> 35 units - Glipizide XL 10 mg daily in am (added 04/2014)  Pt checks her sugars 1-2x day and they are - brings log: - am: 90-190, but usually ~120 >> 110-150 >> 83-157 >> 123, 141-205, 260 >> 83-134, 163 - after b'fast: 193, 200 ? >>  >> 131, 307 - before lunch: 170s >> n/c >> 134-162, 193 >> 121-160 - after lunch: 87, 99, 115-165, 266, 314 >> n/c >> 116-165, 329 - before dinner: 160s, can increase to 200s >> n/c >> 133-209, 220 >> 142, 143 - after dinner: 110-240 >> 163-257 >> 151-179, 314 - bedtime: 200s >> 116-230 >> 155, 274 Can have lows. Lowest sugar was 123 >> 83; she has hypoglycemia awareness at 90. Highest sugar was 250s >> 300s.   Pt's meals are: - Breakfast: bowl of cereal (rice krispies, lucky charms) with milk 2% - Lunch: PB sandwich - Dinner: pork chop + rice/potatoes + green beans - can/no salads - Snacks: no; smtms apples or bananas She is limited in what she can eat due to her gastroparesis.  - no CKD, last BUN/creatinine:  Lab Results  Component Value  Date   BUN 16 12/20/2013   CREATININE 1.0 12/20/2013  Not an an ACEI. Last ACR 0.3 in 04/2012. - last set of lipids: Lab Results  Component Value Date   CHOL 198 12/20/2013   HDL 69.60 12/20/2013   LDLCALC 103* 12/20/2013   LDLDIRECT 126.8 09/23/2012   TRIG 126.0 12/20/2013   CHOLHDL 3 12/20/2013  Not on a Statin. - last eye exam was in ~2009. No DR. Cannot afford eye exam , does have blurry vision. - + numbness and tingling in her feet.  I reviewed pt's medications, allergies, PMH, social hx, family hx, and changes were documented in the history of present illness. Otherwise, unchanged from my initial visit note.  She had an endoscopy >> started on a new med for gastroparesis >> cannot remember name.  ROS: Constitutional:no weight gain/loss, + fatigue, + subjective hyperthermia, poor sleep Eyes: + blurry vision, no xerophthalmia ENT: + sore throat, no nodules palpated in throat, no dysphagia/no odynophagia, + tinnitus Cardiovascular: + CP/no SOB/palpitations/+ leg swelling Respiratory: +  cough/no SOB/no wheezing Gastrointestinal: + N/no V/no D/+ C/+ heartburn Musculoskeletal: + both: muscle/joint aches Skin: no rash, no itching Neurological: no tremors/numbness/tingling/dizziness, + HA + low libido  PE: BP 122/76 mmHg  Pulse 79  Temp(Src) 98.5 F (36.9  C) (Oral)  Resp 12  Wt 247 lb 6.4 oz (112.22 kg)  SpO2 96% Body mass index is 41.17 kg/(m^2). Wt Readings from Last 3 Encounters:  08/14/14 247 lb 6.4 oz (112.22 kg)  07/24/14 251 lb (113.853 kg)  07/23/14 251 lb 6.4 oz (114.034 kg)   Constitutional: obese, in NAD Eyes: PERRLA, EOMI, no exophthalmos ENT: moist mucous membranes, no thyromegaly, no cervical lymphadenopathy Cardiovascular: RRR, No MRG Respiratory: CTA B Gastrointestinal: abdomen soft, NT, ND, BS+ Musculoskeletal: no deformities, strength intact in all 4 Skin: moist, warm, no rashes Neurological: no tremor with outstretched hands, DTR normal in all  4  ASSESSMENT: 1. DM2, insulin-dependent, uncontrolled, with complications - gastroparesis - 07/2010 - At 120 minutes, the amount of tracer remaining in the stomach ~39% (nl <30%). Seen by Dr Sharlett Iles - recommended to start Domperidone a that time >> could not afford.  - PN  PLAN:  1. Patient with long-standing, uncontrolled diabetes, on basal insulin and Glipizide. Sugars improved execpt spikes - brother died recently. - We will continue current regimen:  Patient Instructions  Please continue Lantus 35 units at bedtime. Continue Glipizide XL 10 mg in am.  As a diabetic, you  Need an eye exam every year.  Please return in 2 month with your sugar log.   - continue checking her sugars at different times of the day - check 2-3 times a day, rotating checks  - advised for yearly eye exams - she is due - reviewed recent HbA1c level, will check a new one at next visit - had flu vaccine this season - Return to clinic in 2 mo with sugar log

## 2014-08-14 NOTE — Patient Instructions (Signed)
Please continue Lantus 35 units at bedtime. Continue Glipizide XL 10 mg in am.  As a diabetic, you  Need an eye exam every year.  Please return in 2 month with your sugar log.

## 2014-09-03 ENCOUNTER — Telehealth: Payer: Self-pay | Admitting: Family Medicine

## 2014-09-03 ENCOUNTER — Emergency Department: Admit: 2014-09-03 | Disposition: A | Discharge status: Home / Self Care | Payer: Self-pay | Admitting: Student

## 2014-09-03 LAB — BASIC METABOLIC PANEL
ANION GAP: 6 — AB (ref 7–16)
BUN: 14 mg/dL
CALCIUM: 8.9 mg/dL
Chloride: 101 mmol/L
Co2: 30 mmol/L
Creatinine: 0.89 mg/dL
EGFR (African American): >60
EGFR (Non-African Amer.): >60
Glucose: 250 mg/dL — ABNORMAL HIGH
Potassium: 4.1 mmol/L
SODIUM: 137 mmol/L

## 2014-09-03 LAB — TROPONIN I: Troponin-I: <0.03 ng/mL

## 2014-09-03 LAB — CBC
HCT: 38.2 % (ref 35.0–47.0)
HGB: 13 g/dL (ref 12.0–16.0)
MCH: 27.7 pg (ref 26.0–34.0)
MCHC: 33.9 g/dL (ref 32.0–36.0)
MCV: 82 fL (ref 80–100)
Platelet: 252 10*3/uL (ref 150–440)
RBC: 4.67 10*6/uL (ref 3.80–5.20)
RDW: 13.7 % (ref 11.5–14.5)
WBC: 7.9 10*3/uL (ref 3.6–11.0)

## 2014-09-03 LAB — D-DIMER(ARMC): D-Dimer: 544 ng/ml

## 2014-09-03 NOTE — Telephone Encounter (Signed)
Aware-will watch for notes  

## 2014-09-03 NOTE — Telephone Encounter (Signed)
Patient Name: Alexa Woods  DOB: Feb 25, 1961    Initial Comment Caller states she is having pain in her chest. When she coughs or sits up it hurts.    Nurse Assessment  Nurse: Leilani Merl, RN, Heather Date/Time (Eastern Time): 09/03/2014 1:15:32 PM  Confirm and document reason for call. If symptomatic, describe symptoms. ---Caller states she has been having pain in her chest and neck. When she coughs or sits up it hurts or if she breathes real deep it hurts. Caller states that it started about 3 days ago. Caller states that she has the chest pain all the time, but when she coughs, sits up or breathes deep it hurts more.  Has the patient traveled out of the country within the last 30 days? ---Not Applicable  Does the patient require triage? ---Yes  Related visit to physician within the last 2 weeks? ---No  Does the PT have any chronic conditions? (i.e. diabetes, asthma, etc.) ---Yes  List chronic conditions. ---DM, asthma, anxiety, MI a long time ago     Guidelines    Guideline Title Affirmed Question Affirmed Notes  Chest Pain [1] Chest pain lasts > 5 minutes AND [2] history of heart disease (i.e., heart attack, bypass surgery, angina, angioplasty, CHF; not just a heart murmur)    Final Disposition User   Call EMS 911 Now Standifer, RN, Conservator, museum/gallery states that she is not going to call 911 to take her to the ED, but she will get her husband to take her to the ED ASAP.

## 2014-09-07 ENCOUNTER — Ambulatory Visit (INDEPENDENT_AMBULATORY_CARE_PROVIDER_SITE_OTHER): Payer: Managed Care, Other (non HMO) | Admitting: Family Medicine

## 2014-09-07 ENCOUNTER — Telehealth: Payer: Self-pay | Admitting: Family Medicine

## 2014-09-07 ENCOUNTER — Encounter: Payer: Self-pay | Admitting: Family Medicine

## 2014-09-07 VITALS — BP 126/84 | HR 80 | Temp 98.4°F | Resp 18 | Wt 250.1 lb

## 2014-09-07 DIAGNOSIS — R0789 Other chest pain: Secondary | ICD-10-CM

## 2014-09-07 NOTE — Telephone Encounter (Signed)
Patient Name: Alexa Woods  Gender: Female  DOB: 04-Aug-1960   Age: 54 Y 41 M 22 D  Return Phone Number: [phone] (Primary)  Address: [address]   City/State/Zip: [place] [address]   Client [facility] Client  Client Site [place]   Contact Type Call  Call Type Triage / Clinical     Relationship To Patient Self        Return Phone Number [phone] (Primary)  Chief Complaint CHEST PAIN (>=21 years) - pain, pressure, heaviness or tightness  Initial Comment Caller states, chest pains,      PreDisposition Call Doctor      Nurse Assessment  Nurse: Verlin Fester, RN, Stanton Kidney Date/Time [name] Time): 09/07/2014 11:30:45 AM  Confirm and document reason for call. If symptomatic, describe symptoms. ---Patient states she has had chest pain for over a week. She went to the ED and they did a lot of tests and said she might have a pulled muscle. The pain is so much that she can't take it anymore. She is taking Percocet and it is not helping. States she is having some shortness of breath as well.  Has the patient traveled out of the country within the last 30 days? ---No  Does the patient require triage? ---Yes  Related visit to physician within the last 2 weeks? ---Yes  Does the PT have any chronic conditions? (i.e. diabetes, asthma, etc.) ---Yes  List chronic conditions. ---"diabetes, asthma, heart murmur, hx heart attack years ago,  Did the patient indicate they were pregnant? ---No     Guidelines      Guideline Title Affirmed Question Affirmed Notes Nurse Date/Time [name] Time)  Chest Pain Difficulty breathing  Verlin Fester, RNStanton Kidney 09/07/2014 11:33:48 AM   Disp. Time [name] Time) Disposition Final User   09/07/2014 11:29:55 AM Send to Urgent Queue  Eather Colas   09/07/2014 11:50:22 AM Called On-Call Provider  Noe, RN, Stanton Kidney    Reason: Office to notify them of ED outcome and refusal, spoke with Amber and  she asked to be transferred to patient for appt in the office.      09/07/2014 11:50:36 AM Call Completed  Verlin Fester RN, [facility]    09/07/2014 11:46:54 AM Go to ED Now Yes Verlin Fester, RN, Nemiah Commander Understands: Yes  Disagree/Comply: Disagree  Disagree/Comply Reason: Disagree with instructions   Care Advice Given Per Guideline    GO TO ED NOW: You need to be seen in the Emergency Department. Go to the ER at ___________ [place] now. Drive carefully. NOTE TO TRIAGER - DRIVING: * If immediate transportation is not available via car or taxi, then the patient should be instructed to call [phone]. * Another adult should drive. BRING MEDICINES: * Please bring a list of your current medicines when you go to the Emergency Department (ER). * It is also a good idea to bring the pill bottles too. This will help the doctor to make certain you are taking the right medicines and the right dose. CALL EMS IF: * Severe difficulty breathing occurs * Passes out or becomes too weak to stand * You become worse. CARE ADVICE given per Chest Pain (Adult) guideline.     After Care Instructions Given     Call Event Type User Date / Time Description        Comments  User: Bettye Boeck, RN Date/Time (  Eastern Time): 09/07/2014 11:49:12 AM  After triage patient states she doesn't want to go back to ED she wants to be seen in the office

## 2014-09-07 NOTE — Progress Notes (Signed)
She was working in the yard, straining to lift some heavy objects.  Had mid chest pain. Pain with deep breath, with sitting up, twisting, movement.    Seen at [facility] 09/03/14.  CT and CXR and EKG done, no sig findings per patient.  Didn't have to stay overnight.  Pain is the same since going to ER.  No FCNAVD.  Some sweats, likely from the pain.  On oxycodone at baseline for back and neck pain but that isn't helping the chest pain- oxycodone per Dr. Nelva Bush. She ties to limit oxycodone to BID at baseline.    D dimer was up to CT chest w/o PE.  ER notes reviewed/   Meds, vitals, and allergies reviewed.   ROS: See HPI.  Otherwise, noncontributory.  nad ncat Mmm Neck supple, no LA rrr ctab Pain midline to L side of anterior chest, chest wall ttp abd soft Ext w/o edema

## 2014-09-07 NOTE — Patient Instructions (Signed)
I would take ibuprofen (200mg  per tab), take up to 3 tabs at a time with food.  You can take it up to 3 times a day (up to 9 pills per day).  Take an extra 1/2 tab of oxycodone as needed.  This should gradually improve.   Take care.

## 2014-09-07 NOTE — Telephone Encounter (Signed)
Will see today.  

## 2014-09-07 NOTE — Telephone Encounter (Signed)
Pt has appt 09/07/14 at 3 PM with Dr Damita Dunnings; according to appt note oked by Dr Glori Bickers.

## 2014-09-10 DIAGNOSIS — R0789 Other chest pain: Secondary | ICD-10-CM | POA: Insufficient documentation

## 2014-09-10 NOTE — Assessment & Plan Note (Signed)
Likely MSK strain/chest wall source.   Can take ibuprofen (200mg  per tab), take up to 3 tabs at a time with food. Can take it up to 3 times a day (up to 9 pills per day).  Take an extra 1/2 tab of oxycodone as needed.  This should gradually improve.   D/w pt.   She agrees.  Fu prn.

## 2014-10-15 ENCOUNTER — Ambulatory Visit (INDEPENDENT_AMBULATORY_CARE_PROVIDER_SITE_OTHER): Payer: Managed Care, Other (non HMO) | Admitting: Internal Medicine

## 2014-10-15 ENCOUNTER — Encounter: Payer: Self-pay | Admitting: Internal Medicine

## 2014-10-15 VITALS — BP 134/86 | HR 87 | Temp 98.3°F | Resp 12 | Wt 244.0 lb

## 2014-10-15 DIAGNOSIS — E1140 Type 2 diabetes mellitus with diabetic neuropathy, unspecified: Secondary | ICD-10-CM | POA: Diagnosis not present

## 2014-10-15 DIAGNOSIS — E1142 Type 2 diabetes mellitus with diabetic polyneuropathy: Secondary | ICD-10-CM

## 2014-10-15 MED ORDER — INSULIN GLARGINE 100 UNIT/ML ~~LOC~~ SOLN: 30.0000 [IU] | Freq: Every day | SUBCUTANEOUS | Status: DC

## 2014-10-15 MED ORDER — INSULIN ASPART 100 UNIT/ML ~~LOC~~ SOLN: 7.0000 [IU] | Freq: Three times a day (TID) | SUBCUTANEOUS | Status: DC

## 2014-10-15 NOTE — Progress Notes (Signed)
Patient ID: Alexa Woods, female   DOB: 1960/12/27, 54 y.o.   MRN: [id number]  HPI: Alexa Woods is a 54 y.o.-year-old female, returning for f/u for DM2 dx 1997, insulin-dependent since 2010, uncontrolled, with  complications (gastroparesis - dx 2012, PN). Last visit 2 mo ago.  She will have surgery for carpal tunnel soon.  Last hemoglobin A1c was: Lab Results  Component Value Date   HGBA1C 9.5* 07/03/2014   HGBA1C 10.1* 04/02/2014   HGBA1C 9.9* 12/20/2013   I advised her to use: - Lantus 30 units at night  - Novolog 7 units with breakfast, lunch and dinner. Please take 9 units for a large meal. - Following Sliding scale - add to the above doses: - 150-175: + 1 unit  - 176-200: + 2 units  - 201-225: + 3 units  - 226-250: + 4 units  - 251-275: + 5 units - 276-300: + 6 units Could not tolerate Metformin >> GI upset. (N+V) Tried Januvia >> nausea.  She is on: - Lantus 45 units at night >> 40 units >> 35 units - Glipizide XL 10 mg daily in am (added 04/2014)  Pt checks her sugars 1-2x day and they are - brings log: - am: 90-190, but usually ~120 >> 110-150 >> 83-157 >> 123, 141-205, 260 >> 83-134, 163 >> 112-207 - after b'fast: 193, 200 ? >>  >> 131, 307 >> 196-241 - before lunch: 170s >> n/c >> 134-162, 193 >> 121-160 >> 156-220 - after lunch: 87, 99, 115-165, 266, 314 >> n/c >> 116-165, 329 >> 164-223 - before dinner: 160s, can increase to 200s >> n/c >> 133-209, 220 >> 142, 143 >> may have lows (does not check) 179-267 - after dinner: 110-240 >> 163-257 >> 151-179, 314 >> 193-210 - bedtime: 200s >> 116-230 >> 155, 274 >> n/c Can have lows. Lowest sugar was 123 >> 83 >> ?; she has hypoglycemia awareness at 90. Highest sugar was 250s >> 308.  Pt's meals are: - Breakfast: bowl of cereal (rice krispies, lucky charms) with milk 2% - Lunch: PB sandwich - Dinner: pork chop + rice/potatoes + green beans - can/no salads - Snacks: no; smtms apples or bananas She is limited in  what she can eat due to her gastroparesis.  - no CKD, last BUN/creatinine:  Lab Results  Component Value Date   BUN 14 09/03/2014   CREATININE 0.89 09/03/2014  Not an an ACEI. Last ACR 0.3 in 04/2012. - last set of lipids: Lab Results  Component Value Date   CHOL 198 12/20/2013   HDL 69.60 12/20/2013   LDLCALC 103* 12/20/2013   LDLDIRECT 126.8 09/23/2012   TRIG 126.0 12/20/2013   CHOLHDL 3 12/20/2013  Not on a Statin. - last eye exam was in ~2009. No DR. Cannot afford eye exam , does have blurry vision. - + numbness and tingling in her feet.  She had an endoscopy >> started on a new med for gastroparesis >> cannot remember name.  I reviewed pt's medications, allergies, PMH, social hx, family hx, and changes were documented in the history of present illness. Otherwise, unchanged from my initial visit note.  ROS: Constitutional:no weight gain/loss, + fatigue, + subjective hyperthermia, + poor sleep Eyes: + blurry vision, no xerophthalmia ENT: no sore throat, no nodules palpated in throat, no dysphagia/no odynophagia, + tinnitus Cardiovascular: no CP/no SOB/+ palpitations/+ leg swelling Respiratory: +  cough/no SOB/no wheezing Gastrointestinal: + N/no V/no D/+ C/+ heartburn Musculoskeletal: + both: muscle/joint aches Skin:  no rash, + itching Neurological: no tremors/numbness/tingling/dizziness, + HA + low libido   PE: BP 134/86 mmHg  Pulse 87  Temp(Src) 98.3 F (36.8 C) (Oral)  Resp 12  Wt 244 lb (110.678 kg)  SpO2 96% Body mass index is 40.6 kg/(m^2). Wt Readings from Last 3 Encounters:  10/15/14 244 lb (110.678 kg)  09/07/14 250 lb 1.9 oz (113.454 kg)  08/14/14 247 lb 6.4 oz (112.22 kg)   Constitutional: obese, in NAD Eyes: PERRLA, EOMI, no exophthalmos ENT: moist mucous membranes, no thyromegaly, no cervical lymphadenopathy Cardiovascular: RRR, No MRG Respiratory: CTA B Gastrointestinal: abdomen soft, NT, ND, BS+ Musculoskeletal: no deformities, strength intact  in all 4 Skin: moist, warm, no rashes Neurological: no tremor with outstretched hands, DTR normal in all 4  ASSESSMENT: 1. DM2, insulin-dependent, uncontrolled, with complications - gastroparesis - 07/2010 - At 120 minutes, the amount of tracer remaining in the stomach ~39% (nl <30%). Seen by Dr Sharlett Iles - recommended to start Domperidone a that time >> could not afford.  - PN  PLAN:  1. Patient with long-standing, uncontrolled diabetes, on basal insulin and Glipizide. Sugars worse as she was stressed (husband alcoholic, smoker) >> she ate more junk food - We will add back mealtime insulin and decrease Glipizide XL:  Patient Instructions  - Please continue Lantus 30 units at night   - Decrease Glipizide XL to 5 mg daily  - Please restart: - Novolog 7 units with breakfast, lunch and dinner. Please take 9 units for a large meal. - Following Sliding scale - add to the above doses: - 150-175: + 1 unit  - 176-200: + 2 units  - 201-225: + 3 units  - 226-250: + 4 units  - 251-275: + 5 units - 276-300: + 6 units  Please stop at the lab.  Please come back for a follow-up appointment in 2 months  - continue checking her sugars at different times of the day - check 2-3 times a day, rotating checks  - she is due for yearly eye exams! - check HbA1c today - Return to clinic in 2 mo with sugar log   Office Visit on 10/15/2014  Component Date Value Ref Range Status  . Hgb A1c MFr Bld 10/15/2014 9.6* 4.6 - 6.5 % Final   Glycemic Control Guidelines for People with Diabetes:Non Diabetic:  <6%Goal of Therapy: <7%Additional Action Suggested:  >8%   HbA1c still high. Restarted mealtime insulin.

## 2014-10-15 NOTE — Patient Instructions (Addendum)
-   Please continue Lantus 30 units at night   - Decrease Glipizide XL to 5 mg daily  - Please restart: - Novolog 7 units with breakfast, lunch and dinner. Please take 9 units for a large meal. - Following Sliding scale - add to the above doses: - 150-175: + 1 unit  - 176-200: + 2 units  - 201-225: + 3 units  - 226-250: + 4 units  - 251-275: + 5 units - 276-300: + 6 units  Please stop at the lab.  Please come back for a follow-up appointment in 2 months

## 2014-10-16 ENCOUNTER — Other Ambulatory Visit: Payer: Self-pay

## 2014-10-16 LAB — HEMOGLOBIN A1C: Hgb A1c MFr Bld: 9.6 % — ABNORMAL HIGH (ref 4.6–6.5)

## 2014-10-16 MED ORDER — ALBUTEROL SULFATE HFA 108 (90 BASE) MCG/ACT IN AERS: 2.0000 | INHALATION_SPRAY | Freq: Four times a day (QID) | RESPIRATORY_TRACT | Status: DC | PRN

## 2014-10-16 NOTE — Telephone Encounter (Signed)
done

## 2014-10-16 NOTE — Telephone Encounter (Signed)
Please refill times 3 

## 2014-10-16 NOTE — Telephone Encounter (Signed)
Pt request refill ventolin HFA to [facility]; pt said has not refilled in over one year; pt having allergies; pt is scheduled to have carpal tunnel surgery on 10/23/14 and wants med in case has breathing problems. Pt said has been long time since pt had CPX.Please advise.

## 2014-11-12 ENCOUNTER — Other Ambulatory Visit: Payer: Self-pay | Admitting: Family Medicine

## 2014-11-23 ENCOUNTER — Telehealth: Payer: Self-pay | Admitting: Family Medicine

## 2014-11-23 NOTE — Telephone Encounter (Signed)
Patient Name: Alexa Woods DOB: 03/17/1951 Initial Comment Caller states is having a rash, bumps around circle, back hurts, needs to know what to do? Nurse Assessment Nurse: Vallery Sa, RN, Cathy Date/Time (Eastern Time): 11/23/2014 1:41:38 PM Confirm and document reason for call. If symptomatic, describe symptoms. ---Caller states she developed right flank pain about 3 days ago that is worse again today. She developed about 5 boils on her back yesterday. No fever. Has the patient traveled out of the country within the last 30 days? ---No Does the patient require triage? ---Yes Related visit to physician within the last 2 weeks? ---No Does the PT have any chronic conditions? (i.e. diabetes, asthma, etc.) ---Yes List chronic conditions. ---Diabetes, Asthma, Anxiety, Depression, Arthritis, Carpal Tunnel surgery Oct 23, 2014 Guidelines Guideline Title Affirmed Question Affirmed Notes Flank Pain [1] SEVERE pain (e.g., excruciating, scale 8-10) AND [2] present > 1 hour Final Disposition User Go to ED Now Vallery Sa, RN, [place] She plans to go to [facility] ER

## 2014-11-23 NOTE — Telephone Encounter (Signed)
Noted. Would suggest [facility] not ER evaluation if she hasn't gone yet. But I do recommend she be evaluated today.

## 2014-11-23 NOTE — Telephone Encounter (Signed)
Left detailed message on voicemail.  

## 2014-11-23 NOTE — Telephone Encounter (Signed)
Patient Name: Alexa Woods DOB: [birth year] Initial Comment Caller states is having a rash, bumps around circle, back hurts, needs to know what to do? Nurse Assessment Nurse: Vallery Sa, RN, Cathy Date/Time (Eastern Time): 11/23/2014 1:41:38 PM Confirm and document reason for call. If symptomatic, describe symptoms. ---Caller states she developed right flank pain about 3 days ago that is worse again today. She developed about 5 boils on her back yesterday. No fever. Has the patient traveled out of the country within the last 30 days? ---No Does the patient require triage? ---Yes Related visit to physician within the last 2 weeks? ---No Does the PT have any chronic conditions? (i.e. diabetes, asthma, etc.) ---Yes List chronic conditions. ---Diabetes, Asthma, Anxiety, Depression, Arthritis, Carpal Tunnel surgery Oct 23, 2014 Guidelines Guideline Title Affirmed Question Affirmed Notes Flank Pain [1] SEVERE pain (e.g., excruciating, scale 8-10) AND [2] present > 1 hour Final Disposition User Go to ED Now Vallery Sa, RN, [employer]

## 2014-11-25 ENCOUNTER — Encounter: Payer: Self-pay | Admitting: Medical Oncology

## 2014-11-25 ENCOUNTER — Emergency Department
Admission: EM | Admit: 2014-11-25 | Discharge: 2014-11-25 | Disposition: A | Discharge status: Home / Self Care | Payer: Managed Care, Other (non HMO) | Attending: Emergency Medicine | Admitting: Emergency Medicine

## 2014-11-25 DIAGNOSIS — E1140 Type 2 diabetes mellitus with diabetic neuropathy, unspecified: Secondary | ICD-10-CM | POA: Diagnosis not present

## 2014-11-25 DIAGNOSIS — M545 Low back pain: Secondary | ICD-10-CM | POA: Diagnosis not present

## 2014-11-25 DIAGNOSIS — Z794 Long term (current) use of insulin: Secondary | ICD-10-CM | POA: Diagnosis not present

## 2014-11-25 DIAGNOSIS — Z87891 Personal history of nicotine dependence: Secondary | ICD-10-CM | POA: Insufficient documentation

## 2014-11-25 DIAGNOSIS — Z79899 Other long term (current) drug therapy: Secondary | ICD-10-CM | POA: Diagnosis not present

## 2014-11-25 DIAGNOSIS — I10 Essential (primary) hypertension: Secondary | ICD-10-CM | POA: Insufficient documentation

## 2014-11-25 DIAGNOSIS — R21 Rash and other nonspecific skin eruption: Secondary | ICD-10-CM | POA: Diagnosis present

## 2014-11-25 DIAGNOSIS — B029 Zoster without complications: Secondary | ICD-10-CM | POA: Insufficient documentation

## 2014-11-25 MED ORDER — GABAPENTIN 300 MG PO CAPS: 300.0000 mg | ORAL_CAPSULE | Freq: Three times a day (TID) | ORAL | Status: DC

## 2014-11-25 MED ORDER — ACYCLOVIR 400 MG PO TABS: 400.0000 mg | ORAL_TABLET | Freq: Every day | ORAL | Status: DC

## 2014-11-25 NOTE — Discharge Instructions (Signed)

## 2014-11-25 NOTE — ED Notes (Signed)
Pt ambulatory to triage with reports of rash to rt side of back with pain.

## 2014-11-25 NOTE — ED Notes (Signed)
Rash to right side and back with pain , itching,  Hx of shingles

## 2014-11-25 NOTE — ED Provider Notes (Signed)
[facility] Emergency Department Provider Note  ____________________________________________  Time seen: Approximately 1:48 PM  I have reviewed the triage vital signs and the nursing notes.   HISTORY  Chief Complaint Rash    HPI Alexa Woods is a 54 y.o. female is for evaluation of right flank rash and pain. Symptoms onset 3 days ago. Denies any fever chills does complain of increased pain in the right flank area rates pain occurred prior to the brachial rash.   Past Medical History  Diagnosis Date  . Epigastric abdominal pain   . Alcohol abuse, unspecified   . Allergic rhinitis, cause unspecified   . Anxiety state, unspecified   . Unspecified asthma(493.90)   . Backache, unspecified   . Other chronic cystitis   . Depressive disorder, not elsewhere classified   . Type II or unspecified type diabetes mellitus without mention of complication, not stated as uncontrolled   . Dysphagia, unspecified(787.20)   . Esophageal reflux   . Unspecified hemorrhoids without mention of complication   . Herpes zoster without mention of complication   . Unspecified essential hypertension   . Hypopotassemia   . Morbid obesity   . Other screening mammogram   . Pain in joint, lower leg   . Other psoriasis   . History of Bell's palsy 4/09  . Wrist fracture   . Edema   . Gastroparesis   . Myocardial infarction     Patient Active Problem List   Diagnosis Date Noted  . Chest wall pain 09/10/2014  . LUQ abdominal pain 07/19/2014  . UTI (urinary tract infection) 05/09/2014  . Infected sebaceous cyst 02/26/2014  . Joint swelling 12/20/2013  . Leg pain, bilateral 11/28/2013  . Acute bacterial sinusitis 10/17/2013  . Abdominal pain, other specified site 03/16/2013  . Paresthesia of skin 02/08/2013  . Rash of face 02/08/2013  . Tick bite of back 02/03/2013  . Periodontal disease 02/03/2013  . Hyperlipidemia, mild 09/27/2012  . Gastritis and gastroduodenitis  08/02/2012  . Rash of hands 05/23/2012  . Asthma with acute exacerbation 05/23/2012  . 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 Lung density on x-ray 02/23/2011  . Thrombocytopenia 02/20/2011  . DYSPHAGIA UNSPECIFIED 08/07/2010  . Low back pain 07/22/2010  . MORBID OBESITY 03/30/2008  . 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  . Type 2 diabetes mellitus with peripheral neuropathy 11/30/2006  . ANXIETY 11/30/2006  . History of alcohol abuse 11/30/2006  . DEPRESSION 11/30/2006  . HYPERTENSION 11/30/2006  . HEMORRHOIDS 11/30/2006  . ALLERGIC RHINITIS 11/30/2006  . ASTHMA 11/30/2006  . GERD 11/30/2006  . CYSTITIS, CHRONIC 11/30/2006  . PSORIASIS 11/30/2006    Past Surgical History  Procedure Laterality Date  . Appendectomy    . Cholecystectomy    . Partial hysterectomy    . Knee arthroscopy w/ partial medial meniscectomy Right 10/2008    and chondroplasty  . Total knee arthroplasty  2011    Dr. Ricki Rodriguez  . Esophagogastroduodenoscopy  07/2002    erythematous gastropathy  . Shoulder surgery Left 05/2010    Dr. Alphonzo Severance  . Dobutamine stress echo  2009     normal stress echo, no evidence of ischemia  . Mri  11/2102    pt states she has a bone spur on her spine  . Elbow surgery Right     for nerve damage    Current Outpatient Rx  Name  Route  Sig  Dispense  Refill  . acyclovir (ZOVIRAX) 400 MG tablet  Oral   Take 1 tablet (400 mg total) by mouth 5 (five) times daily.   50 tablet   0   . albuterol (PROVENTIL HFA;VENTOLIN HFA) 108 (90 BASE) MCG/ACT inhaler   Inhalation   Inhale 2 puffs into the lungs every 6 (six) hours as needed for wheezing or shortness of breath.   18 g   2     Please call pt when Rx ready   . dicyclomine (BENTYL) 10 MG capsule   Oral   Take 1 capsule (10 mg total) by mouth 3 (three) times daily as needed for spasms.   90 capsule   1   . gabapentin (NEURONTIN) 300 MG capsule   Oral   Take 1 capsule (300 mg total) by mouth 3 (three) times  daily.   30 capsule   2   . glipiZIDE (GLIPIZIDE XL) 5 MG 24 hr tablet   Oral   Take 2 tablets (10 mg total) by mouth daily with breakfast.   60 tablet   2   . glucose blood (ONE TOUCH ULTRA TEST) test strip      Use to test blood sugar 4 times daily as instructed.   125 each   2   . hydrochlorothiazide (HYDRODIURIL) 25 MG tablet      TAKE 1 TABLET BY MOUTH DAILY   30 tablet   5   . HYDROcodone-homatropine (HYCODAN) 5-1.5 MG/5ML syrup   Oral   Take 5 mLs by mouth every 8 (eight) hours as needed for cough.   120 mL   0   . insulin aspart (NOVOLOG) 100 UNIT/ML injection   Subcutaneous   Inject 7-10 Units into the skin 3 (three) times daily before meals.   30 mL   1   . insulin glargine (LANTUS) 100 UNIT/ML injection   Subcutaneous   Inject 0.3 mLs (30 Units total) into the skin at bedtime.   30 mL   1   . Insulin Pen Needle (LEADER UNIFINE PENTIPS PLUS) 31G X 5 MM MISC      Use twice daily as instructed.   100 each   2   . Lancets (ONETOUCH ULTRASOFT) lancets      Use to test blood sugar 4 times daily as instructed.   125 each   2   . LORazepam (ATIVAN) 1 MG tablet   Oral   Take 1 mg by mouth every 8 (eight) hours as needed for anxiety or sleep.          Marland Kitchen oxyCODONE-acetaminophen (PERCOCET) 10-325 MG per tablet   Oral   Take 1 tablet by mouth every 6 (six) hours as needed. For pain          . pantoprazole (PROTONIX) 40 MG tablet   Oral   Take 1 tablet (40 mg total) by mouth 2 (two) times daily.   60 tablet   1     Pharmacy-please d/c any remaining rx for omeprazol ...   . perphenazine (TRILAFON) 2 MG tablet   Oral   Take 2 mg by mouth at bedtime.         . potassium chloride (K-DUR,KLOR-CON) 10 MEQ tablet      TAKE 1 TABLET BY MOUTH DAILY   30 tablet   5   . sertraline (ZOLOFT) 100 MG tablet   Oral   Take 200 mg by mouth daily.          Marland Kitchen tiZANidine (ZANAFLEX) 4 MG tablet               .  zolpidem (AMBIEN) 10 MG tablet    Oral   Take 15 mg by mouth at bedtime as needed for sleep.            Allergies Bupropion hcl; Cefuroxime axetil; Codeine; Lidocaine; Metformin; Paroxetine; Propoxyphene n-acetaminophen; Sulfonamide derivatives; and Tramadol hcl  Family History  Problem Relation Age of Onset  . Alcohol abuse Mother   . Hypertension Mother   . Esophageal cancer Maternal Grandmother   . Colon cancer Neg Hx   . Lung cancer      mat great uncle    Social History History  Substance Use Topics  . Smoking status: Former Smoker    Quit date: 12/30/2001  . Smokeless tobacco: Never Used  . Alcohol Use: No     Comment: former alcoholic     Review of Systems Constitutional: No fever/chills Eyes: No visual changes. ENT: No sore throat. Cardiovascular: Denies chest pain. Respiratory: Denies shortness of breath. Gastrointestinal: No abdominal pain.  No nausea, no vomiting.  No diarrhea.  No constipation. Genitourinary: Negative for dysuria. Musculoskeletal: Positive for back pain. Skin: Positive for rash Neurological: Negative for headaches, focal weakness or numbness. ` 10-point ROS otherwise negative.  ____________________________________________   PHYSICAL EXAM:  VITAL SIGNS: ED Triage Vitals  Enc Vitals Group     BP 11/25/14 1333 134/98 mmHg     Pulse Rate 11/25/14 1333 83     Resp 11/25/14 1333 18     Temp 11/25/14 1333 98.1 F (36.7 C)     Temp Source 11/25/14 1333 Oral     SpO2 11/25/14 1333 97 %     Weight 11/25/14 1333 240 lb (108.863 kg)     Height 11/25/14 1333 5\' 6"  (1.676 m)     Head Cir --      Peak Flow --      Pain Score 11/25/14 1333 10     Pain Loc --      Pain Edu? --      Excl. in [place]? --     Constitutional: Alert and oriented. Well appearing and in no acute distress. Eyes: Conjunctivae are normal. PERRL. EOMI. Head: Atraumatic. Nose: No congestion/rhinnorhea. Mouth/Throat: Mucous membranes are moist.  Oropharynx non-erythematous. Neck: No stridor.     Cardiovascular: Normal rate, regular rhythm. Grossly normal heart sounds.  Good peripheral circulation. Respiratory: Normal respiratory effort.  No retractions. Lungs CTAB. Gastrointestinal: Soft and nontender. No distention. No abdominal bruits. No CVA tenderness. Musculoskeletal: No lower extremity tenderness nor edema.  No joint effusions. Neurologic:  Normal speech and language. No gross focal neurologic deficits are appreciated. Speech is normal. No gait instability. Skin:  Vesicular rash noted along right dermatomal line lateral trunk Psychiatric: Mood and affect are normal. Speech and behavior are normal.  ____________________________________________   LABS (all labs ordered are listed, but only abnormal results are displayed)  Labs Reviewed - No data to display ____________________________________________   PROCEDURES  Procedure(s) performed: None  Critical Care performed: No  ____________________________________________   INITIAL IMPRESSION / ASSESSMENT AND PLAN / ED COURSE  Pertinent labs & imaging results that were available during my care of the patient were reviewed by me and considered in my medical decision making (see chart for details).  Shingles. Rx for Neurontin and acyclovir. Patient follow-up with PCP or return to the ER for any worsening symptomology. ____________________________________________   FINAL CLINICAL IMPRESSION(S) / ED DIAGNOSES  Final diagnoses:  Murdock, PA-C 11/25/14 1454  Randall An  Schaevitz, MD 11/25/14 [phone]

## 2014-11-26 ENCOUNTER — Telehealth: Payer: Self-pay | Admitting: Family Medicine

## 2014-11-26 NOTE — Telephone Encounter (Signed)
Pt notified of Dr. Tower's comments/instructions and verbalized understanding 

## 2014-11-26 NOTE — Telephone Encounter (Signed)
Start the gabapentin (which should help the pain)- also she needs to start the acyclovir asap or it will not work F/u as needed

## 2014-11-26 NOTE — Telephone Encounter (Signed)
Pt called stating she went to ER 6/26 and was diagnosed with shingles. They are right side of back near ribs. She is having muscle spasms and they are very painful. The hospital did not give her any pain medicine bc she is under contract with pain management. They did prescribe her a medication that she has not gotten filled yet (acyclovir?) and neurontin for nerve pain. As I was typing up message, patient put me on hold and we got disconnected. Called pt back with no answer; left her vm stating I was sending Dr. Glori Bickers message to see her recommendations.   Does pt need to come in for a follow up visit? Please advise.

## 2014-12-31 ENCOUNTER — Other Ambulatory Visit: Payer: Self-pay | Admitting: *Deleted

## 2014-12-31 ENCOUNTER — Telehealth: Payer: Self-pay | Admitting: *Deleted

## 2014-12-31 MED ORDER — PANTOPRAZOLE SODIUM 40 MG PO TBEC: 40.0000 mg | DELAYED_RELEASE_TABLET | Freq: Two times a day (BID) | ORAL | Status: DC

## 2014-12-31 NOTE — Telephone Encounter (Signed)
Per Cecille Rubin Hvozdovic PA I can send refills for this patient for pantoprazole sodium 40 mg, # 180, 3 Refills.  Twice daily dosage.  Patient saw Cecille Rubin on 07-23-2014 and DR. Henrene Pastor supervised [employer] PA on that date.

## 2015-01-15 ENCOUNTER — Other Ambulatory Visit (INDEPENDENT_AMBULATORY_CARE_PROVIDER_SITE_OTHER): Payer: Managed Care, Other (non HMO) | Admitting: *Deleted

## 2015-01-15 ENCOUNTER — Ambulatory Visit (INDEPENDENT_AMBULATORY_CARE_PROVIDER_SITE_OTHER): Payer: Managed Care, Other (non HMO) | Admitting: Internal Medicine

## 2015-01-15 ENCOUNTER — Encounter: Payer: Self-pay | Admitting: Internal Medicine

## 2015-01-15 VITALS — BP 112/64 | HR 85 | Temp 98.7°F | Resp 12 | Wt 247.0 lb

## 2015-01-15 DIAGNOSIS — E1142 Type 2 diabetes mellitus with diabetic polyneuropathy: Secondary | ICD-10-CM

## 2015-01-15 DIAGNOSIS — E1140 Type 2 diabetes mellitus with diabetic neuropathy, unspecified: Secondary | ICD-10-CM | POA: Diagnosis not present

## 2015-01-15 LAB — POCT GLYCOSYLATED HEMOGLOBIN (HGB A1C): HEMOGLOBIN A1C: 10.3

## 2015-01-15 MED ORDER — INSULIN ASPART 100 UNIT/ML ~~LOC~~ SOLN
8.0000 [IU] | Freq: Three times a day (TID) | SUBCUTANEOUS | Status: DC
Start: 2015-01-15 — End: 2015-06-24

## 2015-01-15 MED ORDER — GLIPIZIDE ER 5 MG PO TB24: 5.0000 mg | ORAL_TABLET | Freq: Every day | ORAL | Status: DC

## 2015-01-15 MED ORDER — INSULIN GLARGINE 100 UNIT/ML ~~LOC~~ SOLN: 35.0000 [IU] | Freq: Every day | SUBCUTANEOUS | Status: DC

## 2015-01-15 NOTE — Progress Notes (Signed)
Patient ID: Alexa Woods, female   DOB: 1960/12/05, 54 y.o.   MRN: [id number]  HPI: Alexa Woods is a 54 y.o.-year-old female, returning for f/u for DM2 dx 1997, insulin-dependent since 2010, uncontrolled, with  complications (gastroparesis - dx 2012, PN). Last visit 3 mo ago.  She had carpal tunnel surgery in 09/2014.   She had shingles in 10/2014. She was given Gabapentin and Acyclovir. No steroids.  Last hemoglobin A1c was: Lab Results  Component Value Date   HGBA1C 10.3 01/15/2015   HGBA1C 9.6* 10/15/2014   HGBA1C 9.5* 07/03/2014   I advised her to use: - Lantus 30 units at night  - Novolog 7 units with breakfast, lunch and dinner. Please take 9 units for a large meal. - Following Sliding scale - add to the above doses: - 150-175: + 1 unit  - 176-200: + 2 units  - 201-225: + 3 units  - 226-250: + 4 units  - 251-275: + 5 units - 276-300: + 6 units Could not tolerate Metformin >> GI upset. (N+V) Tried Januvia >> nausea.  She is on: - Lantus (vial) 45 units at night >> 40 units >> 35 >> 30 units - missed many doses!!! - Glipizide XL 10 >> 5 mg daily in am (added 04/2014) - Novolog (vial) 7-9 units with breakfast, lunch and dinner  - missed many doses!!! - NovoLog Sliding scale - added 09/2014: - 150-175: + 1 unit  - 176-200: + 2 units  - 201-225: + 3 units  - 226-250: + 4 units  - 251-275: + 5 units - 276-300: + 6 units  Pt checks her sugars 1-2x day and they are - no log, no meter: - am: 110-150 >> 83-157 >> 123, 141-205, 260 >> 83-134, 163 >> 112-207 >> 150-190s - after b'fast: 193, 200 ? >>  >> 131, 307 >> 196-241 >> 180s - before lunch: 170s >> n/c >> 134-162, 193 >> 121-160 >> 156-220 >> 160-240 - after lunch: 87, 99, 115-165, 266, 314 >> n/c >> 116-165, 329 >> 164-223 >> 170s - before dinner: 160s, can increase to 200s >> n/c >> 133-209, 220 >> 142, 143 >> 179-267 >> 160s - after dinner: 110-240 >> 163-257 >> 151-179, 314 >> 193-210 >> 250s - bedtime: 200s >>  116-230 >> 155, 274 >> n/c Can have lows. Lowest sugar was 123 >> 83 >> 80s (after the surgery); she has hypoglycemia awareness at 90. Highest sugar was 250s >> 308 >> 400s.  Pt's meals are: - Breakfast: bowl of cereal (rice krispies, lucky charms) with milk 2% - Lunch: PB sandwich - Dinner: pork chop + rice/potatoes + green beans - can/no salads - Snacks: no; smtms apples or bananas She is limited in what she can eat due to her gastroparesis.  - no CKD, last BUN/creatinine:  Lab Results  Component Value Date   BUN 14 09/03/2014   CREATININE 0.89 09/03/2014  Not an an ACEI. Last ACR 0.3 in 04/2012. - last set of lipids: Lab Results  Component Value Date   CHOL 198 12/20/2013   HDL 69.60 12/20/2013   LDLCALC 103* 12/20/2013   LDLDIRECT 126.8 09/23/2012   TRIG 126.0 12/20/2013   CHOLHDL 3 12/20/2013  Not on a Statin. - last eye exam was in ~2009. No DR. Cannot afford eye exam , does have blurry vision. - + numbness and tingling in her feet.  She had an endoscopy >> started on a new med for gastroparesis >> cannot remember name.  I reviewed pt's medications, allergies, PMH, social hx, family hx, and changes were documented in the history of present illness. Otherwise, unchanged from my initial visit note.  ROS: Constitutional: + weight gain/loss, + fatigue, + subjective hyperthermia, + poor sleep Eyes: + blurry vision, no xerophthalmia ENT: no sore throat, no nodules palpated in throat, no dysphagia/no odynophagia, + tinnitus Cardiovascular: no CP/no SOB/palpitations/+ leg swelling Respiratory: +  Cough/+ SOB/+ wheezing Gastrointestinal: + N/no V/no D/+ C/+ heartburn Musculoskeletal: + both: muscle/joint aches Skin: + rash - hand and foot, + itching Neurological: no tremors/numbness/tingling/dizziness, + HA + low libido   PE: BP 112/64 mmHg  Pulse 85  Temp(Src) 98.7 F (37.1 C) (Oral)  Resp 12  Wt 247 lb (112.038 kg)  SpO2 96% Body mass index is 39.89 kg/(m^2). Wt  Readings from Last 3 Encounters:  01/15/15 247 lb (112.038 kg)  11/25/14 240 lb (108.863 kg)  10/15/14 244 lb (110.678 kg)   Constitutional: obese, in NAD Eyes: PERRLA, EOMI, no exophthalmos ENT: moist mucous membranes, no thyromegaly, no cervical lymphadenopathy Cardiovascular: RRR, No MRG Respiratory: CTA B Gastrointestinal: abdomen soft, NT, ND, BS+ Musculoskeletal: no deformities, strength intact in all 4 Skin: moist, warm, + rash - likely dyshidrotic eczema on pals and soles Neurological: no tremor with outstretched hands, DTR normal in all 4  ASSESSMENT: 1. DM2, insulin-dependent, uncontrolled, with complications - gastroparesis - 07/2010 - At 120 minutes, the amount of tracer remaining in the stomach ~39% (nl <30%). Seen by Dr Sharlett Iles - recommended to start Domperidone a that time >> could not afford.  - PN  PLAN:  1. Patient with long-standing, uncontrolled diabetes, on basal insulin and Glipizide. Sugars worse as she had a shingles episode and a carpal tunnel sx. She also missed many insulin doses - advised to start taking insulin as advised (price is an issue and I suggested NPH and R insulin >> he would not want to do this for now). Will also increased the doses as sugars are quite high  - I suggested:  Patient Instructions  Please change the diabetes medications as follows: - Continue Glipizide XL to 5 mg daily - Please increase Lantus to 35 units at night  - Please increase: - Novolog 8-10 units with breakfast, lunch and dinner.  - Following Sliding scale - add to the above doses: - 150-175: + 1 unit  - 176-200: + 2 units  - 201-225: + 3 units  - 226-250: + 4 units  - 251-275: + 5 units - 276-300: + 6 units  Please return in 3 months with your sugar log.   - continue checking her sugars at different times of the day - check 2-3 times a day, rotating checks  - she is due for yearly eye exams! (not covered by her insurance) - check HbA1c today >> 10.3%  (higher!) - Return to clinic in 3 mo with sugar log

## 2015-01-15 NOTE — Patient Instructions (Signed)
Please change the diabetes medications as follows: - Continue Glipizide XL to 5 mg daily - Please increase Lantus to 35 units at night  - Please increase: - Novolog 8-10 units with breakfast, lunch and dinner.  - Following Sliding scale - add to the above doses: - 150-175: + 1 unit  - 176-200: + 2 units  - 201-225: + 3 units  - 226-250: + 4 units  - 251-275: + 5 units - 276-300: + 6 units  Please return in 3 months with your sugar log.

## 2015-01-17 ENCOUNTER — Other Ambulatory Visit: Payer: Self-pay | Admitting: *Deleted

## 2015-01-17 MED ORDER — GLIPIZIDE ER 5 MG PO TB24: 5.0000 mg | ORAL_TABLET | Freq: Every day | ORAL | Status: DC

## 2015-01-18 ENCOUNTER — Other Ambulatory Visit: Payer: Self-pay | Admitting: Family Medicine

## 2015-01-18 NOTE — Telephone Encounter (Signed)
Electronic refill request, pt doesn't have any recent/future f/u or CPE appt only acute appt but she is f/u with endo, please advise

## 2015-01-18 NOTE — Telephone Encounter (Signed)
Px written for call in   

## 2015-02-01 ENCOUNTER — Other Ambulatory Visit: Payer: Self-pay | Admitting: *Deleted

## 2015-03-18 ENCOUNTER — Telehealth: Payer: Self-pay | Admitting: Emergency Medicine

## 2015-03-18 ENCOUNTER — Other Ambulatory Visit: Payer: Self-pay | Admitting: Emergency Medicine

## 2015-03-18 MED ORDER — DICYCLOMINE HCL 10 MG PO CAPS: 10.0000 mg | ORAL_CAPSULE | Freq: Three times a day (TID) | ORAL | Status: DC | PRN

## 2015-03-18 NOTE — Telephone Encounter (Signed)
Ok I will speak with the patient and schedule a follow-up and refill her RX.

## 2015-03-18 NOTE — Telephone Encounter (Signed)
Patients pharmacy sent in a refill for Dicyclomine 10 mg. Last OV 2/16. Please advise.

## 2015-03-18 NOTE — Telephone Encounter (Signed)
That is fine to refill. She should have a follow-up in February or March.

## 2015-04-18 ENCOUNTER — Ambulatory Visit: Payer: Managed Care, Other (non HMO) | Admitting: Internal Medicine

## 2015-06-24 ENCOUNTER — Ambulatory Visit (INDEPENDENT_AMBULATORY_CARE_PROVIDER_SITE_OTHER): Payer: Managed Care, Other (non HMO) | Admitting: Internal Medicine

## 2015-06-24 ENCOUNTER — Encounter: Payer: Self-pay | Admitting: Internal Medicine

## 2015-06-24 ENCOUNTER — Other Ambulatory Visit (INDEPENDENT_AMBULATORY_CARE_PROVIDER_SITE_OTHER): Payer: Managed Care, Other (non HMO) | Admitting: *Deleted

## 2015-06-24 ENCOUNTER — Other Ambulatory Visit: Payer: Self-pay | Admitting: *Deleted

## 2015-06-24 VITALS — BP 114/62 | HR 78 | Temp 97.7°F | Resp 12 | Wt 245.0 lb

## 2015-06-24 DIAGNOSIS — E1142 Type 2 diabetes mellitus with diabetic polyneuropathy: Secondary | ICD-10-CM | POA: Diagnosis not present

## 2015-06-24 LAB — LIPID PANEL
CHOL/HDL RATIO: 3
Cholesterol: 212 mg/dL — ABNORMAL HIGH (ref 0–200)
HDL: 71.5 mg/dL (ref >39.00)
LDL Cholesterol: 110 mg/dL — ABNORMAL HIGH (ref 0–99)
NonHDL: 140.95
TRIGLYCERIDES: 155 mg/dL — AB (ref 0.0–149.0)
VLDL: 31 mg/dL (ref 0.0–40.0)

## 2015-06-24 LAB — POCT GLYCOSYLATED HEMOGLOBIN (HGB A1C): Hemoglobin A1C: 10.8

## 2015-06-24 MED ORDER — GLIPIZIDE ER 5 MG PO TB24: 5.0000 mg | ORAL_TABLET | Freq: Every day | ORAL | Status: DC

## 2015-06-24 MED ORDER — INSULIN ASPART 100 UNIT/ML ~~LOC~~ SOLN
8.0000 [IU] | Freq: Three times a day (TID) | SUBCUTANEOUS | Status: DC
Start: 2015-06-24 — End: 2016-01-06

## 2015-06-24 MED ORDER — INSULIN GLARGINE 100 UNIT/ML ~~LOC~~ SOLN: 35.0000 [IU] | Freq: Every day | SUBCUTANEOUS | Status: DC

## 2015-06-24 NOTE — Patient Instructions (Signed)
Patient Instructions  Please change the diabetes medications as follows: - Continue Glipizide XL 5 mg daily - Please increase Lantus to 35 units at night  - Please increase: - Novolog 8-10 units with breakfast, lunch and dinner.  - Following Sliding scale - add to the above doses: - 150-175: + 1 unit  - 176-200: + 2 units  - 201-225: + 3 units  - 226-250: + 4 units  - 251-275: + 5 units - 276-300: + 6 units  Please return in 3 months with your sugar log.

## 2015-06-24 NOTE — Progress Notes (Signed)
Patient ID: Alexa Woods, female   DOB: Apr 02, 1961, 55 y.o.   MRN: [id number]  HPI: Alexa Woods is a 55 y.o.-year-old female, returning for f/u for DM2 dx 1997, insulin-dependent since 2010, uncontrolled, with  complications (gastroparesis - dx 2012, PN). Last visit 5 mo ago.  She will get disability soon >> will be able to take meds more consistently and not try to stretch them.  She is out of insulin in the last few days (runs out).  Last hemoglobin A1c was: Lab Results  Component Value Date   HGBA1C 10.3 01/15/2015   HGBA1C 9.6* 10/15/2014   HGBA1C 9.5* 07/03/2014   I advised her to use: - Lantus 30 units at night  - Novolog 7 units with breakfast, lunch and dinner. Please take 9 units for a large meal. - Following Sliding scale - add to the above doses: - 150-175: + 1 unit  - 176-200: + 2 units  - 201-225: + 3 units  - 226-250: + 4 units  - 251-275: + 5 units - 276-300: + 6 units Could not tolerate Metformin >> GI upset. (N+V) Tried Januvia >> nausea.  She is on: - Glipizide XL 5 mg daily in am (added 04/2014) - Lantus (vial) 30 units at night - misses doses!!! - Novolog (vial) 7-9 >> 8-10 units with breakfast, lunch and dinner  - misses doses!!! (using 5 units to stretch the insulin) - NovoLog Sliding scale - added 09/2014: - 150-175: + 1 unit  - 176-200: + 2 units  - 201-225: + 3 units  - 226-250: + 4 units  - 251-275: + 5 units - 276-300: + 6 units  Pt checks her sugars 1-2x day and they are - no log, no meter again: - am: 110-150 >> 83-157 >> 123, 141-205, 260 >> 83-134, 163 >> 112-207 >> 150-190s >> 140-160 - after b'fast: 193, 200 ? >>  >> 131, 307 >> 196-241 >> 180s >> 170-200s - before lunch: 170s >> n/c >> 134-162, 193 >> 121-160 >> 156-220 >> 160-240 >> n/c - after lunch: 87, 99, 115-165, 266, 314 >> n/c >> 116-165, 329 >> 164-223 >> 170s >> 190-200s - before dinner: 160s, can increase to 200s >> n/c >> 133-209, 220 >> 142, 143 >> 179-267 >> 160s >>  n/c - after dinner: 110-240 >> 163-257 >> 151-179, 314 >> 193-210 >> 250s >> 170-180 - bedtime: 200s >> 116-230 >> 155, 274 >> n/c Can have lows. Lowest sugar was 123 >> 83 >> 80s >> 110; she has hypoglycemia awareness at 90. Highest sugar was 250s >> 308 >> 400s >> upper 200s.  Pt's meals are: - Breakfast: bowl of cereal (rice krispies, lucky charms) with milk 2% - Lunch: PB sandwich - Dinner: pork chop + rice/potatoes + green beans  - Snacks: no; smtms apples or bananas She is limited in what she can eat due to her gastroparesis.  - no CKD, last BUN/creatinine:  Lab Results  Component Value Date   BUN 14 09/03/2014   CREATININE 0.89 09/03/2014  Not an an ACEI. Last ACR 0.3 in 04/2012. - last set of lipids: Lab Results  Component Value Date   CHOL 198 12/20/2013   HDL 69.60 12/20/2013   LDLCALC 103* 12/20/2013   LDLDIRECT 126.8 09/23/2012   TRIG 126.0 12/20/2013   CHOLHDL 3 12/20/2013  Not on a Statin. - last eye exam was in ~2009. No DR. Cannot afford eye exam , does have blurry vision. - + numbness and  tingling in her feet.  She had an endoscopy >> started on a new med for gastroparesis >> cannot remember name.  I reviewed pt's medications, allergies, PMH, social hx, family hx, and changes were documented in the history of present illness. Otherwise, unchanged from my initial visit note.  ROS: Constitutional: no weight gain/loss, + fatigue, + subjective hyperthermia, + poor sleep Eyes: + blurry vision, no xerophthalmia ENT: no sore throat, no nodules palpated in throat, no dysphagia/no odynophagia Cardiovascular: no CP/SOB/palpitations/leg swelling Respiratory: +  Cough/+ SOB/+ wheezing Gastrointestinal: + N/no V/no D/+ C/+ heartburn Musculoskeletal: + both: muscle/joint aches Skin: + rash - hand and foot Neurological: no tremors/numbness/tingling/dizziness, + HA  PE: BP 114/62 mmHg  Pulse 78  Temp(Src) 97.7 F (36.5 C) (Oral)  Resp 12  Wt 245 lb (111.131 kg)   SpO2 95% Body mass index is 39.56 kg/(m^2). Wt Readings from Last 3 Encounters:  06/24/15 245 lb (111.131 kg)  01/15/15 247 lb (112.038 kg)  11/25/14 240 lb (108.863 kg)   Constitutional: obese, in NAD Eyes: PERRLA, EOMI, no exophthalmos ENT: moist mucous membranes, no thyromegaly, no cervical lymphadenopathy Cardiovascular: RRR, No MRG Respiratory: CTA B Gastrointestinal: abdomen soft, NT, ND, BS+ Musculoskeletal: no deformities, strength intact in all 4 Skin: moist, warm, + rash - likely dyshidrotic eczema on pals and soles Neurological: no tremor with outstretched hands, DTR normal in all 4  ASSESSMENT: 1. DM2, insulin-dependent, uncontrolled, with complications - gastroparesis - 07/2010 - At 120 minutes, the amount of tracer remaining in the stomach ~39% (nl <30%). Seen by Dr Sharlett Iles - recommended to start Domperidone a that time >> could not afford.  - PN  PLAN:  1. Patient with long-standing, uncontrolled diabetes, on basal insulin and Glipizide. Sugars worse as she is still missing many insulin doses and also trying to stretch them. However, she just got disability >> will be easier for her to get her meds and hopefully have an eye exam covered. - I suggested:  Patient Instructions  Please change the diabetes medications as follows: - Continue Glipizide XL 5 mg daily - Please increase Lantus to 35 units at night  - Please increase: - Novolog 8-10 units with breakfast, lunch and dinner.  - Following Sliding scale - add to the above doses: - 150-175: + 1 unit  - 176-200: + 2 units  - 201-225: + 3 units  - 226-250: + 4 units  - 251-275: + 5 units - 276-300: + 6 units  Please return in 3 months with your sugar log.   - continue checking her sugars at different times of the day - check 2-3 times a day, rotating checks  - she is due for yearly eye exams! (not covered now by her insurance) - will check a Lipid level today - check HbA1c today >> 10.8% (higher!) - Return  to clinic in 3 mo with sugar log

## 2015-07-03 ENCOUNTER — Other Ambulatory Visit: Payer: Self-pay | Admitting: Family Medicine

## 2015-07-04 ENCOUNTER — Other Ambulatory Visit: Payer: Self-pay

## 2015-07-04 MED ORDER — INSULIN PEN NEEDLE 31G X 5 MM MISC: Status: DC

## 2015-07-20 ENCOUNTER — Encounter: Payer: Self-pay | Admitting: Emergency Medicine

## 2015-07-20 ENCOUNTER — Emergency Department: Payer: Managed Care, Other (non HMO)

## 2015-07-20 ENCOUNTER — Emergency Department
Admission: EM | Admit: 2015-07-20 | Discharge: 2015-07-20 | Disposition: A | Discharge status: Home / Self Care | Payer: Managed Care, Other (non HMO) | Attending: Emergency Medicine | Admitting: Emergency Medicine

## 2015-07-20 DIAGNOSIS — Z794 Long term (current) use of insulin: Secondary | ICD-10-CM | POA: Diagnosis not present

## 2015-07-20 DIAGNOSIS — R1012 Left upper quadrant pain: Secondary | ICD-10-CM | POA: Insufficient documentation

## 2015-07-20 DIAGNOSIS — Z79899 Other long term (current) drug therapy: Secondary | ICD-10-CM | POA: Diagnosis not present

## 2015-07-20 DIAGNOSIS — E1142 Type 2 diabetes mellitus with diabetic polyneuropathy: Secondary | ICD-10-CM | POA: Diagnosis not present

## 2015-07-20 DIAGNOSIS — I10 Essential (primary) hypertension: Secondary | ICD-10-CM | POA: Diagnosis not present

## 2015-07-20 DIAGNOSIS — F419 Anxiety disorder, unspecified: Secondary | ICD-10-CM | POA: Insufficient documentation

## 2015-07-20 DIAGNOSIS — Z87891 Personal history of nicotine dependence: Secondary | ICD-10-CM | POA: Diagnosis not present

## 2015-07-20 DIAGNOSIS — Z792 Long term (current) use of antibiotics: Secondary | ICD-10-CM | POA: Diagnosis not present

## 2015-07-20 DIAGNOSIS — R109 Unspecified abdominal pain: Secondary | ICD-10-CM

## 2015-07-20 HISTORY — DX: Gastro-esophageal reflux disease without esophagitis: K21.9

## 2015-07-20 HISTORY — DX: Irritable bowel syndrome, unspecified: K58.9

## 2015-07-20 LAB — URINALYSIS COMPLETE WITH MICROSCOPIC (ARMC ONLY)
BILIRUBIN URINE: NEGATIVE
Bacteria, UA: NONE SEEN
Glucose, UA: >500 mg/dL — AB
HGB URINE DIPSTICK: NEGATIVE
KETONES UR: NEGATIVE mg/dL
LEUKOCYTES UA: NEGATIVE
NITRITE: NEGATIVE
PH: 6 (ref 5.0–8.0)
Protein, ur: NEGATIVE mg/dL
Specific Gravity, Urine: 1.024 (ref 1.005–1.030)

## 2015-07-20 LAB — COMPREHENSIVE METABOLIC PANEL
ALT: 25 U/L (ref 14–54)
AST: 25 U/L (ref 15–41)
Albumin: 3.9 g/dL (ref 3.5–5.0)
Alkaline Phosphatase: 123 U/L (ref 38–126)
Anion gap: 4 — ABNORMAL LOW (ref 5–15)
BUN: 13 mg/dL (ref 6–20)
CO2: 31 mmol/L (ref 22–32)
CREATININE: 0.89 mg/dL (ref 0.44–1.00)
Calcium: 8.9 mg/dL (ref 8.9–10.3)
Chloride: 100 mmol/L — ABNORMAL LOW (ref 101–111)
GFR calc Af Amer: >60 mL/min (ref >60)
Glucose, Bld: 401 mg/dL — ABNORMAL HIGH (ref 65–99)
POTASSIUM: 4 mmol/L (ref 3.5–5.1)
Sodium: 135 mmol/L (ref 135–145)
Total Bilirubin: 0.3 mg/dL (ref 0.3–1.2)
Total Protein: 7.6 g/dL (ref 6.5–8.1)

## 2015-07-20 LAB — CBC
HCT: 37.1 % (ref 35.0–47.0)
Hemoglobin: 12.9 g/dL (ref 12.0–16.0)
MCH: 28.1 pg (ref 26.0–34.0)
MCHC: 34.8 g/dL (ref 32.0–36.0)
MCV: 80.9 fL (ref 80.0–100.0)
PLATELETS: 279 10*3/uL (ref 150–440)
RBC: 4.58 MIL/uL (ref 3.80–5.20)
RDW: 13.5 % (ref 11.5–14.5)
WBC: 8.3 10*3/uL (ref 3.6–11.0)

## 2015-07-20 LAB — LIPASE, BLOOD: Lipase: 23 U/L (ref 11–51)

## 2015-07-20 MED ORDER — NAPROXEN 500 MG PO TABS: 500.0000 mg | ORAL_TABLET | Freq: Two times a day (BID) | ORAL | Status: DC

## 2015-07-20 MED ORDER — SODIUM CHLORIDE 0.9 % IV SOLN: 1000.0000 mL | Freq: Once | INTRAVENOUS | Status: DC

## 2015-07-20 MED ORDER — AZITHROMYCIN 250 MG PO TABS: ORAL_TABLET | ORAL | Status: AC

## 2015-07-20 NOTE — ED Notes (Addendum)
Has seen gi specialist and told she has gerd and ibs and gastroparesis. Pain after she eats, takes omeprazol but not getting better. Has not seen gi since last year. Sees pain mgmt about her back but not for her gi symptoms

## 2015-07-20 NOTE — ED Provider Notes (Signed)
[facility] Provider Note  ____________________________________________    I have reviewed the triage vital signs and the nursing notes.   HISTORY  Chief Complaint Abdominal Pain    HPI Alexa Woods is a 55 y.o. female who presents with complaints of left flank/abdominal pain which she states has been going on for approximately one year but has been worse over the last week. She probably does have a history of gastroparesis but reports that it has not been this bad before. She reports the pain is keeping her up at night and is primarily in the left flank. She denies fevers or chills. No dysuria. She has not taken anything for the pain. She states it is worse after eating. She has had a cholecystectomy     Past Medical History  Diagnosis Date  . Epigastric abdominal pain   . Alcohol abuse, unspecified   . Allergic rhinitis, cause unspecified   . Anxiety state, unspecified   . Unspecified asthma(493.90)   . Backache, unspecified   . Other chronic cystitis   . Depressive disorder, not elsewhere classified   . Type II or unspecified type diabetes mellitus without mention of complication, not stated as uncontrolled   . Dysphagia, unspecified(787.20)   . Esophageal reflux   . Unspecified hemorrhoids without mention of complication   . Herpes zoster without mention of complication   . Unspecified essential hypertension   . Hypopotassemia   . Morbid obesity ([place])   . Other screening mammogram   . Pain in joint, lower leg   . Other psoriasis   . History of Bell's palsy 4/09  . Wrist fracture   . Edema   . Gastroparesis   . Myocardial infarction ([place])   . GERD (gastroesophageal reflux disease)   . IBS (irritable bowel syndrome)     Patient Active Problem List   Diagnosis Date Noted  . Chest wall pain 09/10/2014  . LUQ abdominal pain 07/19/2014  . UTI (urinary tract infection) 05/09/2014  . Infected sebaceous cyst 02/26/2014   . Joint swelling 12/20/2013  . Leg pain, bilateral 11/28/2013  . Acute bacterial sinusitis 10/17/2013  . Abdominal pain, other specified site 03/16/2013  . Paresthesia of skin 02/08/2013  . Rash of face 02/08/2013  . Tick bite of back 02/03/2013  . Periodontal disease 02/03/2013  . Hyperlipidemia, mild 09/27/2012  . Gastritis and gastroduodenitis 08/02/2012  . Rash of hands 05/23/2012  . Asthma with acute exacerbation 05/23/2012  . 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 Lung density on x-ray 02/23/2011  . Thrombocytopenia ([place]) 02/20/2011  . DYSPHAGIA UNSPECIFIED 08/07/2010  . Low back pain 07/22/2010  . MORBID OBESITY 03/30/2008  . 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  . Type 2 diabetes mellitus with peripheral neuropathy ([place]) 11/30/2006  . ANXIETY 11/30/2006  . History of alcohol abuse 11/30/2006  . DEPRESSION 11/30/2006  . HYPERTENSION 11/30/2006  . HEMORRHOIDS 11/30/2006  . ALLERGIC RHINITIS 11/30/2006  . ASTHMA 11/30/2006  . GERD 11/30/2006  . CYSTITIS, CHRONIC 11/30/2006  . PSORIASIS 11/30/2006    Past Surgical History  Procedure Laterality Date  . Appendectomy    . Cholecystectomy    . Partial hysterectomy    . Knee arthroscopy w/ partial medial meniscectomy Right 10/2008    and chondroplasty  . Total knee arthroplasty  2011    Dr. Ricki Rodriguez  . Esophagogastroduodenoscopy  07/2002    erythematous gastropathy  . Shoulder surgery Left 05/2010    Dr. Alphonzo Severance  . Dobutamine  stress echo  2009     normal stress echo, no evidence of ischemia  . Mri  11/2102    pt states she has a bone spur on her spine  . Elbow surgery Right     for nerve damage    Current Outpatient Rx  Name  Route  Sig  Dispense  Refill  . acyclovir (ZOVIRAX) 400 MG tablet   Oral   Take 1 tablet (400 mg total) by mouth 5 (five) times daily.   50 tablet   0   . albuterol (PROVENTIL HFA;VENTOLIN HFA) 108 (90 BASE) MCG/ACT inhaler   Inhalation   Inhale 2 puffs into the lungs every  6 (six) hours as needed for wheezing or shortness of breath.   18 g   2     Please call pt when Rx ready   . cephALEXin (KEFLEX) 500 MG capsule   Oral   Take 500 mg by mouth 3 (three) times daily.          Marland Kitchen dicyclomine (BENTYL) 10 MG capsule   Oral   Take 1 capsule (10 mg total) by mouth 3 (three) times daily as needed for spasms.   90 capsule   1   . gabapentin (NEURONTIN) 300 MG capsule   Oral   Take 1 capsule (300 mg total) by mouth 3 (three) times daily.   30 capsule   2   . glipiZIDE (GLIPIZIDE XL) 5 MG 24 hr tablet   Oral   Take 1 tablet (5 mg total) by mouth daily with breakfast.   30 tablet   2   . glucose blood (ONE TOUCH ULTRA TEST) test strip      Use to test blood sugar 4 times daily as instructed.   125 each   2   . HYDROcodone-homatropine (HYCODAN) 5-1.5 MG/5ML syrup   Oral   Take 5 mLs by mouth every 8 (eight) hours as needed for cough.   120 mL   0   . insulin aspart (NOVOLOG) 100 UNIT/ML injection   Subcutaneous   Inject 8-10 Units into the skin 3 (three) times daily before meals. Plus sliding scale   30 mL   1   . insulin glargine (LANTUS) 100 UNIT/ML injection   Subcutaneous   Inject 0.35 mLs (35 Units total) into the skin at bedtime.   30 mL   1   . Insulin Pen Needle (LEADER UNIFINE PENTIPS PLUS) 31G X 5 MM MISC      Use twice daily as instructed.   100 each   2   . Lancets (ONETOUCH ULTRASOFT) lancets      Use to test blood sugar 4 times daily as instructed.   125 each   2   . LORazepam (ATIVAN) 1 MG tablet   Oral   Take 1 mg by mouth every 8 (eight) hours as needed for anxiety or sleep.          Marland Kitchen oxyCODONE-acetaminophen (PERCOCET) 10-325 MG per tablet   Oral   Take 1 tablet by mouth every 6 (six) hours as needed. For pain          . pantoprazole (PROTONIX) 40 MG tablet   Oral   Take 1 tablet (40 mg total) by mouth 2 (two) times daily.   180 tablet   3     Pharmacy-please d/c any remaining rx for omeprazol  ...   . perphenazine (TRILAFON) 2 MG tablet   Oral   Take 2  mg by mouth at bedtime.         . potassium chloride (K-DUR,KLOR-CON) 10 MEQ tablet      TAKE 1 TABLET BY MOUTH DAILY   30 tablet   11   . sertraline (ZOLOFT) 100 MG tablet   Oral   Take 200 mg by mouth daily.          Marland Kitchen tiZANidine (ZANAFLEX) 4 MG tablet   Oral   Take 4 mg by mouth 2 (two) times daily.          Marland Kitchen zolpidem (AMBIEN) 10 MG tablet   Oral   Take 15 mg by mouth at bedtime as needed for sleep.            Allergies Bupropion hcl; Cefuroxime axetil; Codeine; Lidocaine; Metformin; Paroxetine; Propoxyphene n-acetaminophen; Sulfonamide derivatives; and Tramadol hcl  Family History  Problem Relation Age of Onset  . Alcohol abuse Mother   . Hypertension Mother   . Esophageal cancer Maternal Grandmother   . Colon cancer Neg Hx   . Lung cancer      mat great uncle    Social History Social History  Substance Use Topics  . Smoking status: Former Smoker    Quit date: 12/30/2001  . Smokeless tobacco: Never Used  . Alcohol Use: No     Comment: former alcoholic     Review of Systems  Constitutional: Negative for fever. Eyes: Negative for blurry vision ENT: Negative for sore throat Cardiovascular: Negative for chest pain. Respiratory: Negative for cough Gastrointestinal: As above Genitourinary: Negative for dysuria. Musculoskeletal: Negative for back pain. Skin: Negative for rash. Neurological: Negative for headaches  Psychiatric: Anxious    ____________________________________________   PHYSICAL EXAM:  VITAL SIGNS: ED Triage Vitals  Enc Vitals Group     BP 07/20/15 1648 108/85 mmHg     Pulse Rate 07/20/15 1648 78     Resp 07/20/15 1648 20     Temp 07/20/15 1648 98.1 F (36.7 C)     Temp Source 07/20/15 1648 Oral     SpO2 07/20/15 1648 95 %     Weight 07/20/15 1648 248 lb (112.492 kg)     Height 07/20/15 1648 5\' 6"  (1.676 m)     Head Cir --      Peak Flow --      Pain  Score 07/20/15 1648 8     Pain Loc --      Pain Edu? --      Excl. in [place]? --      Constitutional: Alert and oriented. Well appearing and in no distress. Eyes: Conjunctivae are normal.  ENT   Head: Normocephalic and atraumatic.   Mouth/Throat: Mucous membranes are moist. Cardiovascular: Normal rate, regular rhythm. Normal and symmetric distal pulses are present in all extremities. Respiratory: Normal respiratory effort without tachypnea nor retractions.  Gastrointestinal: Soft and non-tender in all quadrants. No distention. There is no CVA tenderness. Genitourinary: deferred Musculoskeletal: Nontender with normal range of motion in all extremities. No lower extremity tenderness nor edema. Neurologic:  Normal speech and language. No gross focal neurologic deficits are appreciated. Skin:  Skin is warm, dry and intact. No rash noted. Psychiatric: Mood and affect are normal. Patient exhibits appropriate insight and judgment.  ____________________________________________    LABS (pertinent positives/negatives)  Labs Reviewed  COMPREHENSIVE METABOLIC PANEL - Abnormal; Notable for the following:    Chloride 100 (*)    Glucose, Bld 401 (*)    Anion gap 4 (*)  All other components within normal limits  URINALYSIS COMPLETEWITH MICROSCOPIC ([facility] ONLY) - Abnormal; Notable for the following:    Color, Urine YELLOW (*)    APPearance HAZY (*)    Glucose, UA >500 (*)    Squamous Epithelial / LPF 6-30 (*)    All other components within normal limits  LIPASE, BLOOD  CBC    ____________________________________________   EKG  None  ____________________________________________    RADIOLOGY I have personally reviewed any xrays that were ordered on this patient: CT abdomen pelvis unremarkable  ____________________________________________   PROCEDURES  Procedure(s) performed: none  Critical Care performed:  none  ____________________________________________   INITIAL IMPRESSION / ASSESSMENT AND PLAN / ED COURSE  Pertinent labs & imaging results that were available during my care of the patient were reviewed by me and considered in my medical decision making (see chart for details).  Patient resents with essentially acute on chronic left sided abdominal pain. This is most likely exacerbation of her gastroparesis. We will check CT to rule out kidney stone. Her glucose is elevated but anion gap is normal  ----------------------------------------- 7:21 PM on 07/20/2015 -----------------------------------------  CT renal stone study is normal, although possibility of atelectasis versus pneumonia in the lingula.  I will prescribe antibiotics and have her follow-up with her PCP for her chronic pain.  ____________________________________________   FINAL CLINICAL IMPRESSION(S) / ED DIAGNOSES  Final diagnoses:  Flank pain, acute  Left upper quadrant pain     Lavonia Drafts, MD 07/20/15 2356

## 2015-07-20 NOTE — Discharge Instructions (Signed)

## 2015-07-20 NOTE — ED Notes (Signed)
L upper abd pain x 1 year, states has had workups in past. States has this pain every day. States worse after she eats.

## 2015-07-22 ENCOUNTER — Telehealth: Payer: Self-pay | Admitting: Family Medicine

## 2015-07-22 DIAGNOSIS — R1012 Left upper quadrant pain: Secondary | ICD-10-CM

## 2015-07-22 NOTE — Telephone Encounter (Signed)
Pt states she wants Dr. Glori Bickers to give her a referral to GI for her left rib that has been bothering her for a long time.

## 2015-07-22 NOTE — Telephone Encounter (Signed)
I did the ref  Will route to [place] as well

## 2015-07-30 ENCOUNTER — Other Ambulatory Visit: Payer: Self-pay | Admitting: Family Medicine

## 2015-07-31 NOTE — Telephone Encounter (Signed)
Pt hasn't been seen by Dr. Glori Bickers in over a year. Left voicemail requesting pt to call back and schedule a f/u with Dr. Glori Bickers before we can refill med

## 2015-08-02 ENCOUNTER — Encounter: Payer: Self-pay | Admitting: Family Medicine

## 2015-08-02 ENCOUNTER — Ambulatory Visit (INDEPENDENT_AMBULATORY_CARE_PROVIDER_SITE_OTHER): Payer: Managed Care, Other (non HMO) | Admitting: Family Medicine

## 2015-08-02 VITALS — BP 132/80 | HR 74 | Temp 98.3°F | Ht 67.0 in | Wt 249.5 lb

## 2015-08-02 DIAGNOSIS — I10 Essential (primary) hypertension: Secondary | ICD-10-CM | POA: Diagnosis not present

## 2015-08-02 DIAGNOSIS — R300 Dysuria: Secondary | ICD-10-CM | POA: Diagnosis not present

## 2015-08-02 DIAGNOSIS — E669 Obesity, unspecified: Secondary | ICD-10-CM

## 2015-08-02 DIAGNOSIS — R1012 Left upper quadrant pain: Secondary | ICD-10-CM | POA: Diagnosis not present

## 2015-08-02 DIAGNOSIS — Z23 Encounter for immunization: Secondary | ICD-10-CM | POA: Diagnosis not present

## 2015-08-02 MED ORDER — POTASSIUM CHLORIDE CRYS ER 10 MEQ PO TBCR: 10.0000 meq | EXTENDED_RELEASE_TABLET | Freq: Every day | ORAL | Status: DC

## 2015-08-02 MED ORDER — ALBUTEROL SULFATE HFA 108 (90 BASE) MCG/ACT IN AERS: 2.0000 | INHALATION_SPRAY | Freq: Four times a day (QID) | RESPIRATORY_TRACT | Status: DC | PRN

## 2015-08-02 MED ORDER — HYDROCHLOROTHIAZIDE 25 MG PO TABS: 25.0000 mg | ORAL_TABLET | Freq: Every day | ORAL | Status: DC

## 2015-08-02 NOTE — Progress Notes (Signed)
Subjective:    Patient ID: Alexa Woods, female    DOB: 1960-06-23, 55 y.o.   MRN: [id number]  HPI Here for f/u of chronic health problems  Was seen in ED last mo for L rib/side pain - over a year -has been to the hospital  Just under L ribs -occ tender to the touch -rad backwards  Has nausea but no vomiting  No renal stone on CT Still taking protonix for acid  ? Lingular atelectasis  Was treated   Ct Renal Stone Study  07/20/2015  CLINICAL DATA:  Left upper abdominal pain and left flank pain 1 year. Pain worse over the past 2 days. Previous appendectomy and cholecystectomy. EXAM: CT ABDOMEN AND PELVIS WITHOUT CONTRAST TECHNIQUE: Multidetector CT imaging of the abdomen and pelvis was performed following the standard protocol without IV contrast. COMPARISON:  05/27/2013 FINDINGS: Lung bases demonstrate a stable 3-4 mm nodule over the lateral left base. Subtle focal opacification over the lingula which may be due to atelectasis or early infection. Abdominal images demonstrate evidence of a previous cholecystectomy. The liver, spleen, pancreas and adrenal glands are normal. Stomach is normal. Kidneys normal in size without hydronephrosis or nephrolithiasis. Small single bilateral renal cysts are unchanged. Ureters are normal. Colon is within normal. Small bowel is within normal. There is no free fluid or focal inflammatory change. Subtle calcification over the distal abdominal aorta and left common iliac artery. 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 The ovaries, bladder and rectum are normal. Intramuscular lipoma over the left sartorius muscle. Minimal degenerate change of the spine and hips. IMPRESSION: No acute findings in the abdomen/pelvis. Subtle focal opacification over the lingula which may be due to atelectasis or early infection. Recommend chest radiographic correlation and consider followup CT 3-4 weeks. Small single bilateral simple renal cysts unchanged.  Electronically Signed   By: Marin Olp M.D.   On: 07/20/2015 18:37   Given zpak and not any better      Chemistry      Component Value Date/Time   NA 135 07/20/2015 1656   NA 137 09/03/2014 1604   K 4.0 07/20/2015 1656   K 4.1 09/03/2014 1604   CL 100* 07/20/2015 1656   CL 101 09/03/2014 1604   CO2 31 07/20/2015 1656   CO2 30 09/03/2014 1604   BUN 13 07/20/2015 1656   BUN 14 09/03/2014 1604   CREATININE 0.89 07/20/2015 1656   CREATININE 0.89 09/03/2014 1604      Component Value Date/Time   CALCIUM 8.9 07/20/2015 1656   CALCIUM 8.9 09/03/2014 1604   ALKPHOS 123 07/20/2015 1656   ALKPHOS 125* 05/26/2013 1844   AST 25 07/20/2015 1656   AST 28 05/26/2013 1844   ALT 25 07/20/2015 1656   ALT 33 05/26/2013 1844   BILITOT 0.3 07/20/2015 1656   BILITOT 0.3 05/26/2013 1844      Lab Results  Component Value Date   WBC 8.3 07/20/2015   HGB 12.9 07/20/2015   HCT 37.1 07/20/2015   MCV 80.9 07/20/2015   PLT 279 07/20/2015      DM is starting to improved - seeing Dr Cruzita Lederer Lab Results  Component Value Date   HGBA1C 10.8 06/24/2015   Eating -not much due to abd pain   Wt is up 1 lb with bmi 39   Cholesterol Lab Results  Component Value Date   CHOL 212* 06/24/2015   HDL 71.50 06/24/2015   LDLCALC 110* 06/24/2015   LDLDIRECT 126.8 09/23/2012  TRIG 155.0* 06/24/2015   CHOLHDL 3 06/24/2015     BP Readings from Last 3 Encounters:  08/02/15 132/80  07/20/15 118/64  06/24/15 114/62    Always going through stress- mood is fair   Still some dysuria  Bladder has been uncomfortable for a long time Some wbc in ua in ED  Patient Active Problem List   Diagnosis Date Noted  . Dysuria 08/02/2015  . LUQ abdominal pain 07/19/2014  . Infected sebaceous cyst 02/26/2014  . Joint swelling 12/20/2013  . Leg pain, bilateral 11/28/2013  . Paresthesia of skin 02/08/2013  . Periodontal disease 02/03/2013  . Hyperlipidemia, mild 09/27/2012  . Gastritis and  gastroduodenitis 08/02/2012  . Rash of hands 05/23/2012  . 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 Lung density on x-ray 02/23/2011  . DYSPHAGIA UNSPECIFIED 08/07/2010  . Low back pain 07/22/2010  . Obesity 03/30/2008  . 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  . Type 2 diabetes mellitus with peripheral neuropathy ([place]) 11/30/2006  . ANXIETY 11/30/2006  . History of alcohol abuse 11/30/2006  . DEPRESSION 11/30/2006  . Essential hypertension 11/30/2006  . HEMORRHOIDS 11/30/2006  . ALLERGIC RHINITIS 11/30/2006  . ASTHMA 11/30/2006  . GERD 11/30/2006  . CYSTITIS, CHRONIC 11/30/2006  . PSORIASIS 11/30/2006   Past Medical History  Diagnosis Date  . Epigastric abdominal pain   . Alcohol abuse, unspecified   . Allergic rhinitis, cause unspecified   . Anxiety state, unspecified   . Unspecified asthma(493.90)   . Backache, unspecified   . Other chronic cystitis   . Depressive disorder, not elsewhere classified   . Type II or unspecified type diabetes mellitus without mention of complication, not stated as uncontrolled   . Dysphagia, unspecified(787.20)   . Esophageal reflux   . Unspecified hemorrhoids without mention of complication   . Herpes zoster without mention of complication   . Unspecified essential hypertension   . Hypopotassemia   . Morbid obesity ([place])   . Other screening mammogram   . Pain in joint, lower leg   . Other psoriasis   . History of Bell's palsy 4/09  . Wrist fracture   . Edema   . Gastroparesis   . Myocardial infarction ([place])   . GERD (gastroesophageal reflux disease)   . IBS (irritable bowel syndrome)    Past Surgical History  Procedure Laterality Date  . Appendectomy    . Cholecystectomy    . Partial hysterectomy    . Knee arthroscopy w/ partial medial meniscectomy Right 10/2008    and chondroplasty  . Total knee arthroplasty  2011    Dr. Ricki Rodriguez  . Esophagogastroduodenoscopy  07/2002    erythematous gastropathy  . Shoulder surgery Left  05/2010    Dr. Alphonzo Severance  . Dobutamine stress echo  2009     normal stress echo, no evidence of ischemia  . Mri  11/2102    pt states she has a bone spur on her spine  . Elbow surgery Right     for nerve damage   Social History  Substance Use Topics  . Smoking status: Former Smoker    Quit date: 12/30/2001  . Smokeless tobacco: Never Used  . Alcohol Use: No     Comment: former alcoholic    Family History  Problem Relation Age of Onset  . Alcohol abuse Mother   . Hypertension Mother   . Esophageal cancer Maternal Grandmother   . Colon cancer Neg Hx   . Lung cancer  mat great uncle   Allergies  Allergen Reactions  . Bupropion Hcl   . Cefuroxime Axetil Nausea Only  . Codeine     REACTION: nausea and vomiting, rash  . Lidocaine     REACTION: unknown  . Metformin     REACTION: GI  . Paroxetine     REACTION: doesn't agree  . Propoxyphene N-Acetaminophen     REACTION: wheezing  . Sulfonamide Derivatives     REACTION: rash  . Tramadol Hcl     REACTION: Causes Anxiety   Current Outpatient Prescriptions on File Prior to Visit  Medication Sig Dispense Refill  . acyclovir (ZOVIRAX) 400 MG tablet Take 1 tablet (400 mg total) by mouth 5 (five) times daily. 50 tablet 0  . dicyclomine (BENTYL) 10 MG capsule Take 1 capsule (10 mg total) by mouth 3 (three) times daily as needed for spasms. 90 capsule 1  . gabapentin (NEURONTIN) 300 MG capsule Take 1 capsule (300 mg total) by mouth 3 (three) times daily. 30 capsule 2  . glipiZIDE (GLIPIZIDE XL) 5 MG 24 hr tablet Take 1 tablet (5 mg total) by mouth daily with breakfast. 30 tablet 2  . glucose blood (ONE TOUCH ULTRA TEST) test strip Use to test blood sugar 4 times daily as instructed. 125 each 2  . insulin aspart (NOVOLOG) 100 UNIT/ML injection Inject 8-10 Units into the skin 3 (three) times daily before meals. Plus sliding scale 30 mL 1  . insulin glargine (LANTUS) 100 UNIT/ML injection Inject 0.35 mLs (35 Units total) into the  skin at bedtime. 30 mL 1  . Insulin Pen Needle (LEADER UNIFINE PENTIPS PLUS) 31G X 5 MM MISC Use twice daily as instructed. 100 each 2  . Lancets (ONETOUCH ULTRASOFT) lancets Use to test blood sugar 4 times daily as instructed. 125 each 2  . LORazepam (ATIVAN) 1 MG tablet Take 1 mg by mouth every 8 (eight) hours as needed for anxiety or sleep.     . pantoprazole (PROTONIX) 40 MG tablet Take 1 tablet (40 mg total) by mouth 2 (two) times daily. 180 tablet 3  . perphenazine (TRILAFON) 2 MG tablet Take 2 mg by mouth at bedtime.    . sertraline (ZOLOFT) 100 MG tablet Take 200 mg by mouth daily.     Marland Kitchen tiZANidine (ZANAFLEX) 4 MG tablet Take 4 mg by mouth 2 (two) times daily.     Marland Kitchen zolpidem (AMBIEN) 10 MG tablet Take 15 mg by mouth at bedtime as needed for sleep.     . [DISCONTINUED] potassium chloride (KLOR-CON) 10 MEQ CR tablet Take 1 tablet (10 mEq total) by mouth daily. 60 tablet 6   No current facility-administered medications on file prior to visit.    Review of Systems Review of Systems  Constitutional: Negative for fever, appetite change, and unexpected weight change.  ENT pos for pnd  Eyes: Negative for pain and visual disturbance.  Respiratory: Negative for cough and shortness of breath.  pos for occ wheeze/helped by albuterol  Cardiovascular: Negative for cp or palpitations    Gastrointestinal: Negative for  diarrhea and constipation. neg for blood in stool or dark stool, pos for L upper abdominal pain -ongoing  Genitourinary: pos for chronic  urgency and frequency. pos for chronic dysuria (worse lately) Skin: Negative for pallor or rash   Neurological: Negative for weakness, light-headedness, numbness and headaches.  Hematological: Negative for adenopathy. Does not bruise/bleed easily.  Psychiatric/Behavioral: pos for dysphoric mood. The patient is nervous/anxious.  (sees psychiatry)  Objective:   Physical Exam  Constitutional: She appears well-developed and well-nourished. No  distress.  obese and well appearing   HENT:  Head: Normocephalic and atraumatic.  Mouth/Throat: Oropharynx is clear and moist.  Eyes: Conjunctivae and EOM are normal. Pupils are equal, round, and reactive to light. Right eye exhibits no discharge. Left eye exhibits no discharge. No scleral icterus.  Neck: Normal range of motion. Neck supple. No JVD present. Carotid bruit is not present. No thyromegaly present.  Cardiovascular: Normal rate, regular rhythm, normal heart sounds and intact distal pulses.  Exam reveals no gallop.   Pulmonary/Chest: Effort normal. No respiratory distress. She has wheezes. She has no rales. She exhibits no tenderness.  No crackles  Scant wheeze on forced exp only  No rales or rhonchi Good air exch  Abdominal: Soft. Bowel sounds are normal. She exhibits no distension, no pulsatile liver, no abdominal bruit, no pulsatile midline mass and no mass. There is no hepatosplenomegaly. There is tenderness in the epigastric area, suprapubic area and left upper quadrant. There is no rigidity, no rebound, no guarding, no CVA tenderness, no tenderness at McBurney's point and negative Murphy's sign.  Musculoskeletal: She exhibits no edema or tenderness.  Lymphadenopathy:    She has no cervical adenopathy.  Neurological: She is alert. She has normal reflexes. No cranial nerve deficit. She exhibits normal muscle tone. Coordination normal.  Skin: Skin is warm and dry. No rash noted.  Psychiatric: Her speech is normal and behavior is normal. Her mood appears anxious. Thought content is not paranoid. She does not exhibit a depressed mood. She expresses no homicidal and no suicidal ideation.          Assessment & Plan:   Problem List Items Addressed This Visit      Cardiovascular and Mediastinum   Essential hypertension - Primary    bp in fair control at this time  BP Readings from Last 1 Encounters:  08/02/15 132/80   No changes needed Disc lifstyle change with low sodium  diet and exercise  Rev last labs and lipids Refilled hctz and K  Enc wt loss and DASH diet       Relevant Medications   hydrochlorothiazide (HYDRODIURIL) 25 MG tablet     Other   Dysuria    Clean catch-cx today inst to drink more water/fluids as tolerated Rev ED ua /records       Relevant Orders   Urine culture   LUQ abdominal pain    This is ongoing - for over a year  On ppi Rev CT from the ED as well as labs and other assessment Agree with ref to GI- has appt later this mo  ? If DM gastroparesis plays a role  Will update before appt if symptoms worsen      Obesity    Discussed how this problem influences overall health and the risks it imposes  Reviewed plan for weight loss with lower calorie diet (via better food choices and also portion control or program like weight watchers) and exercise building up to or more than 30 minutes 5 days per week including some aerobic activity   Enc further work on wt loss        Other Visit Diagnoses    Need for influenza vaccination        Relevant Orders    Flu Vaccine QUAD 36+ mos PF IM (Fluarix & Fluzone Quad PF) (Completed)    Need for 23-polyvalent pneumococcal polysaccharide vaccine  Relevant Orders    Pneumococcal polysaccharide vaccine 23-valent greater than or equal to 2yo subcutaneous/IM (Completed)

## 2015-08-02 NOTE — Assessment & Plan Note (Signed)
Discussed how this problem influences overall health and the risks it imposes  Reviewed plan for weight loss with lower calorie diet (via better food choices and also portion control or program like weight watchers) and exercise building up to or more than 30 minutes 5 days per week including some aerobic activity   Enc further work on wt loss

## 2015-08-02 NOTE — Patient Instructions (Addendum)
Stay off diet soda-it can make your bladder hurt  Drink more water -this will help headaches  Try to get exercise as tolerated Follow up with GI for abdominal pain as planned Leave a urine sample so we can send it for culture I'm glad you had your flu shot    Use your inhaler as needed especially during allergy season as needed   You are overdue for a mammogram - schedule one at [facility] are overdue for an eye exam for diabetes- check with your insurance - it may be medical and not vision - call us if you need a referral

## 2015-08-02 NOTE — Assessment & Plan Note (Signed)
Clean catch-cx today inst to drink more water/fluids as tolerated Rev ED Alexa Woods [facility]

## 2015-08-02 NOTE — Progress Notes (Signed)
Pre visit review using our clinic review tool, if applicable. No additional management support is needed unless otherwise documented below in the visit note. 

## 2015-08-02 NOTE — Assessment & Plan Note (Signed)
This is ongoing - for over a year  On ppi Rev CT from the ED as well as labs and other assessment Agree with ref to GI- has appt later this mo  ? If DM gastroparesis plays a role  Will update before appt if symptoms worsen

## 2015-08-02 NOTE — Assessment & Plan Note (Signed)
bp in fair control at this time  BP Readings from Last 1 Encounters:  08/02/15 132/80   No changes needed Disc lifstyle change with low sodium diet and exercise  Rev last labs and lipids Refilled hctz and K  Enc wt loss and DASH diet

## 2015-08-03 LAB — URINE CULTURE
Colony Count: NO GROWTH
ORGANISM ID, BACTERIA: NO GROWTH

## 2015-08-28 ENCOUNTER — Ambulatory Visit (INDEPENDENT_AMBULATORY_CARE_PROVIDER_SITE_OTHER): Payer: Managed Care, Other (non HMO) | Admitting: Internal Medicine

## 2015-08-28 ENCOUNTER — Encounter: Payer: Self-pay | Admitting: Internal Medicine

## 2015-08-28 VITALS — BP 100/70 | HR 76 | Ht 65.25 in | Wt 243.4 lb

## 2015-08-28 DIAGNOSIS — K219 Gastro-esophageal reflux disease without esophagitis: Secondary | ICD-10-CM | POA: Diagnosis not present

## 2015-08-28 DIAGNOSIS — R1012 Left upper quadrant pain: Secondary | ICD-10-CM | POA: Diagnosis not present

## 2015-08-28 DIAGNOSIS — E108 Type 1 diabetes mellitus with unspecified complications: Secondary | ICD-10-CM

## 2015-08-28 DIAGNOSIS — E669 Obesity, unspecified: Secondary | ICD-10-CM

## 2015-08-28 DIAGNOSIS — M791 Myalgia: Secondary | ICD-10-CM

## 2015-08-28 DIAGNOSIS — K5909 Other constipation: Secondary | ICD-10-CM | POA: Diagnosis not present

## 2015-08-28 DIAGNOSIS — E1065 Type 1 diabetes mellitus with hyperglycemia: Secondary | ICD-10-CM

## 2015-08-28 DIAGNOSIS — M7918 Myalgia, other site: Secondary | ICD-10-CM

## 2015-08-28 DIAGNOSIS — IMO0002 Reserved for concepts with insufficient information to code with codable children: Secondary | ICD-10-CM

## 2015-08-28 NOTE — Patient Instructions (Signed)
As discussed with Dr. Henrene Pastor, use Miralax daily and review the reflux precautions sheet
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
## 2015-09-20 ENCOUNTER — Ambulatory Visit: Payer: Managed Care, Other (non HMO) | Admitting: Internal Medicine

## 2015-09-25 ENCOUNTER — Ambulatory Visit: Payer: Managed Care, Other (non HMO) | Admitting: Internal Medicine

## 2015-10-01 ENCOUNTER — Other Ambulatory Visit (INDEPENDENT_AMBULATORY_CARE_PROVIDER_SITE_OTHER): Payer: Managed Care, Other (non HMO) | Admitting: *Deleted

## 2015-10-01 ENCOUNTER — Encounter: Payer: Self-pay | Admitting: Internal Medicine

## 2015-10-01 ENCOUNTER — Ambulatory Visit (INDEPENDENT_AMBULATORY_CARE_PROVIDER_SITE_OTHER): Payer: Managed Care, Other (non HMO) | Admitting: Internal Medicine

## 2015-10-01 ENCOUNTER — Other Ambulatory Visit: Payer: Managed Care, Other (non HMO)

## 2015-10-01 VITALS — BP 104/60 | HR 87 | Temp 98.1°F | Resp 12 | Wt 242.0 lb

## 2015-10-01 DIAGNOSIS — E1142 Type 2 diabetes mellitus with diabetic polyneuropathy: Secondary | ICD-10-CM

## 2015-10-01 LAB — POCT GLYCOSYLATED HEMOGLOBIN (HGB A1C): HEMOGLOBIN A1C: 10.2

## 2015-10-01 NOTE — Progress Notes (Signed)
Patient ID: Alexa Woods, female   DOB: 1961-01-09, 55 y.o.   MRN: [id number]  HPI: Alexa Woods is a 55 y.o.-year-old female, returning for f/u for DM2 dx 1997, insulin-dependent since 2010, uncontrolled, with  complications (gastroparesis - dx 2012, PN). Last visit 4 mo ago.  She got disability since last visit.  She is out of insulin in the last few days (runs out).  Last hemoglobin A1c was: Lab Results  Component Value Date   HGBA1C 10.8 06/24/2015   HGBA1C 10.3 01/15/2015   HGBA1C 9.6* 10/15/2014   I advised her to use: - Lantus 30 units at night  - Novolog 7 units with breakfast, lunch and dinner. Please take 9 units for a large meal. - Following Sliding scale - add to the above doses: - 150-175: + 1 unit  - 176-200: + 2 units  - 201-225: + 3 units  - 226-250: + 4 units  - 251-275: + 5 units - 276-300: + 6 units Could not tolerate Metformin >> GI upset. (N+V) Tried Januvia >> nausea.  She is on: - Glipizide XL 5 mg daily - Lantus 35 units at night   - NovoLog Sliding scale - add to the above doses: - 150-175: + 1 unit  - 176-200: + 2 units  - 201-225: + 3 units  - 226-250: + 4 units  - 251-275: + 5 units - 276-300: + 6 units  Pt checks her sugars 1-2x day and they are - no log, no meter again: - am: 110-150 >> 83-157 >> 123, 141-205, 260 >> 83-134, 163 >> 112-207 >> 150-190s >> 140-160 >> 97, 124-197 - after b'fast: 193, 200 ? >>  >> 131, 307 >> 196-241 >> 180s >> 170-200s >> 164-207 - before lunch: 170s >> n/c >> 134-162, 193 >> 121-160 >> 156-220 >> 160-240 >> n/c >> 158, 200 - after lunch: 87, 99, 115-165, 266, 314 >> n/c >> 116-165, 329 >> 164-223 >> 170s >> 190-200s >> 124-206, 270 - before dinner: 133-209, 220 >> 142, 143 >> 179-267 >> 160s >> n/c >> 118-155 - after dinner: 110-240 >> 163-257 >> 151-179, 314 >> 193-210 >> 250s >> 170-180 >> 149-186, 225 - bedtime: 200s >> 116-230 >> 155, 274 >> n/c >> 173, 215 Can have lows. Lowest sugar was 123 >> 83  >> 80s >> 110 >> 97; she has hypoglycemia awareness at 90. Highest sugar was 250s >> 308 >> 400s >> upper 200s >> upper 200s.  Pt's meals are: - Breakfast: bowl of cereal (rice krispies, lucky charms) with milk 2% - Lunch: PB sandwich - Dinner: pork chop + rice/potatoes + green beans  - Snacks: no; smtms apples or bananas She is limited in what she can eat due to her gastroparesis.  - no CKD, last BUN/creatinine:  Lab Results  Component Value Date   BUN 13 07/20/2015   CREATININE 0.89 07/20/2015  Not an an ACEI. Last ACR 0.3 in 04/2012. - last set of lipids: Lab Results  Component Value Date   CHOL 212* 06/24/2015   HDL 71.50 06/24/2015   LDLCALC 110* 06/24/2015   LDLDIRECT 126.8 09/23/2012   TRIG 155.0* 06/24/2015   CHOLHDL 3 06/24/2015  Not on a Statin. - last eye exam was in ~2009. No DR. Cannot afford eye exam , does have blurry vision. - + numbness and tingling in her feet.  She had an endoscopy >> started on a new med for gastroparesis >> cannot remember name.  I reviewed  pt's medications, allergies, PMH, social hx, family hx, and changes were documented in the history of present illness. Otherwise, unchanged from my initial visit note.  ROS: Constitutional: no weight gain/loss, + fatigue, + subjective hyperthermia, + poor sleep Eyes: + blurry vision, no xerophthalmia ENT: no sore throat, no nodules palpated in throat, no dysphagia/no odynophagia, + hoarseness Cardiovascular: no CP/SOB/+ palpitations/+ leg swelling Respiratory: +  Cough/+ SOB/+ wheezing Gastrointestinal: + N/+ V/no D/+ C/+ heartburn Musculoskeletal: + both: muscle/joint aches Skin: + rash Neurological: no tremors/numbness/tingling/dizziness, no HA  PE: BP 104/60 mmHg  Pulse 87  Temp(Src) 98.1 F (36.7 C) (Oral)  Resp 12  Wt 242 lb (109.77 kg)  SpO2 94% Body mass index is 39.98 kg/(m^2). 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 DM2, insulin-dependent, uncontrolled, with complications - gastroparesis - 07/2010 - At 120 minutes, the amount of tracer remaining in the stomach ~39% (nl <30%). Seen by Dr Sharlett Iles - recommended to start Domperidone a that time >> could not afford.  - PN  PLAN:  1. Patient with long-standing, uncontrolled diabetes, on basal insulin and Glipizide. Sugars are better - but an HbA1c today >> 10.2% (slightly lower!). However, sugars in the log do not explain the HbA1c >> will check a fructosamine. - She is not taking mealtime insulin, only SSI (!) >> discussed about how to add mealtime insulin >> she will start. - I suggested:  Patient Instructions  Please continue: - Glipizide XL 5 mg daily - Lantus 35 units at night   Please add: - Novolog 7-9 units with breakfast, lunch and dinner.   Continue: - NovoLog Sliding scale - add to the above doses: - 150-175: + 1 unit  - 176-200: + 2 units  - 201-225: + 3 units  - 226-250: + 4 units  - 251-275: + 5 units - 276-300: + 6 units  Please stop at [facility] lab.  Please come back for a follow-up appointment in 3 months.  - continue checking her sugars at different times of the day - check 2-3 times a day, rotating checks  - she is due for yearly eye exams! (not covered now by her insurance) - Return to clinic in 3 mo with sugar log   Component     Latest Ref Rng 10/01/2015  Fructosamine     0 - 285 umol/L 431 (H)  HbA1c calculated from fructosamine is  lower than directly measured in blood, but still high: 8.9%.

## 2015-10-01 NOTE — Patient Instructions (Signed)
Please continue: - Glipizide XL 5 mg daily - Lantus 35 units at night   Please add: - Novolog 7-9 units with breakfast, lunch and dinner.   Continue: - NovoLog Sliding scale - add to the above doses: - 150-175: + 1 unit  - 176-200: + 2 units  - 201-225: + 3 units  - 226-250: + 4 units  - 251-275: + 5 units - 276-300: + 6 units  Please stop at [facility] lab.  Please come back for a follow-up appointment in 3 months.

## 2015-10-02 LAB — FRUCTOSAMINE: Fructosamine: 431 umol/L — ABNORMAL HIGH (ref 0–285)

## 2016-01-06 ENCOUNTER — Encounter: Payer: Self-pay | Admitting: Internal Medicine

## 2016-01-06 ENCOUNTER — Other Ambulatory Visit: Payer: Self-pay

## 2016-01-06 ENCOUNTER — Ambulatory Visit (INDEPENDENT_AMBULATORY_CARE_PROVIDER_SITE_OTHER): Payer: Managed Care, Other (non HMO) | Admitting: Internal Medicine

## 2016-01-06 VITALS — BP 122/78 | HR 73 | Ht 66.0 in | Wt 246.0 lb

## 2016-01-06 DIAGNOSIS — E1142 Type 2 diabetes mellitus with diabetic polyneuropathy: Secondary | ICD-10-CM | POA: Diagnosis not present

## 2016-01-06 LAB — POCT GLYCOSYLATED HEMOGLOBIN (HGB A1C): Hemoglobin A1C: 10.9

## 2016-01-06 MED ORDER — INSULIN ASPART 100 UNIT/ML ~~LOC~~ SOLN: 8.0000 [IU] | Freq: Three times a day (TID) | SUBCUTANEOUS | 1 refills | Status: DC

## 2016-01-06 MED ORDER — INSULIN GLARGINE 100 UNIT/ML ~~LOC~~ SOLN
35.0000 [IU] | Freq: Every day | SUBCUTANEOUS | 1 refills | Status: DC
Start: 2016-01-06 — End: 2016-07-09

## 2016-01-06 MED ORDER — GLIPIZIDE ER 5 MG PO TB24: 5.0000 mg | ORAL_TABLET | Freq: Every day | ORAL | 2 refills | Status: DC

## 2016-01-06 NOTE — Progress Notes (Signed)
Patient ID: Alexa Woods, female   DOB: 05/11/1961, 55 y.o.   MRN: [id number]  HPI: Alexa Woods is a 55 y.o.-year-old female, returning for f/u for DM2 dx 1997, insulin-dependent since 2010, uncontrolled, with  complications (gastroparesis - dx 2012, PN). Last visit 3 mo ago.  She is stressed out and ate more recently.  Last hemoglobin A1c was: 10/01/2015: HbA1c calculated from fructosamine is 8.9%. Lab Results  Component Value Date   HGBA1C 10.2 10/01/2015   HGBA1C 10.8 06/24/2015   HGBA1C 10.3 01/15/2015   I advised her to use: - Lantus 30 units at night  - Novolog 7 units with breakfast, lunch and dinner. Please take 9 units for a large meal. - Following Sliding scale - add to the above doses: - 150-175: + 1 unit  - 176-200: + 2 units  - 201-225: + 3 units  - 226-250: + 4 units  - 251-275: + 5 units - 276-300: + 6 units Could not tolerate Metformin >> GI upset. (N+V) Tried Januvia >> nausea.  She is on: - Glipizide XL 5 mg daily - Lantus 35 units at night  - Novolog 7-9 units with breakfast, lunch and dinner - added back 09/2015 >> not taking this with every meal, for e.g. <120 - NovoLog Sliding scale - add to the above doses: - 150-175: + 1 unit  - 176-200: + 2 units  - 201-225: + 3 units  - 226-250: + 4 units  - 251-275: + 5 units - 276-300: + 6 units  Pt checks her sugars 1-2x day and they are - no log, no meter again: - am: 123, 141-205, 260 >> 83-134, 163 >> 112-207 >> 150-190s >> 140-160 >> 97, 124-197  >> 160s, 200s - after b'fast: 193, 200 ? >>  >> 131, 307 >> 196-241 >> 180s >> 170-200s >> 164-207 >> n/c - before lunch: 170s >> n/c >> 134-162, 193 >> 121-160 >> 156-220 >> 160-240 >> n/c >> 158, 200 >> n/c - after lunch:116-165, 329 >> 164-223 >> 170s >> 190-200s >> 124-206, 270  >> 200s - before dinner: 133-209, 220 >> 142, 143 >> 179-267 >> 160s >> n/c >> 118-155 >> n/c - after dinner: 163-257 >> 151-179, 314 >> 193-210 >> 250s >> 170-180 >> 149-186, 225  >> n/c - bedtime: 200s >> 116-230 >> 155, 274 >> n/c >> 173, 215 >> n/c Can have lows. Lowest sugar was 123 >> 83 >> 80s >> 110 >> 97 >> 90s; she has hypoglycemia awareness at 90. Highest sugar was 250s >> 308 >> 400s >> upper 200s >> upper 200s.  Pt's meals are: - Breakfast: bowl of cereal (rice krispies, lucky charms) with milk 2% - Lunch: PB sandwich - Dinner: pork chop + rice/potatoes + green beans  - Snacks: no; smtms apples or bananas She is limited in what she can eat due to her gastroparesis.  - no CKD, last BUN/creatinine:  Lab Results  Component Value Date   BUN 13 07/20/2015   CREATININE 0.89 07/20/2015  Not an an ACEI. Last ACR 0.3 in 04/2012. - last set of lipids: Lab Results  Component Value Date   CHOL 212 (H) 06/24/2015   HDL 71.50 06/24/2015   LDLCALC 110 (H) 06/24/2015   LDLDIRECT 126.8 09/23/2012   TRIG 155.0 (H) 06/24/2015   CHOLHDL 3 06/24/2015  Not on a Statin. - last eye exam was in ~2009. No DR. Cannot afford eye exam , does have blurry vision. - + numbness  and tingling in her feet.  She had an endoscopy >> started on a new med for gastroparesis >> cannot remember name.  I reviewed pt's medications, allergies, PMH, social hx, family hx, and changes were documented in the history of present illness. Otherwise, unchanged from my initial visit note.  ROS: Constitutional: + weight gain, + fatigue, + subjective hyperthermia, + poor sleep Eyes: + blurry vision, no xerophthalmia ENT: no sore throat, no nodules palpated in throat, no dysphagia/no odynophagia, + hoarseness Cardiovascular: + CP/+ SOB/+ palpitations/no  leg swelling Respiratory: +  Cough/+ SOB/+ wheezing Gastrointestinal: + N/no V/no D/+ C/+ heartburn Musculoskeletal: + both: muscle/joint aches Skin: + rash Neurological: no tremors/numbness/tingling/dizziness,+  HA  PE: BP 122/78 (BP Location: Right Arm, Patient Position: Sitting)   Pulse 73   Ht 5\' 6"  (1.676 m)   Wt 246 lb (111.6 kg)    SpO2 97%   BMI 39.71 kg/m  Body mass index is 39.71 kg/m. 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 DM2, insulin-dependent, uncontrolled, with complications - gastroparesis - 07/2010 - At 120 minutes, the amount of tracer remaining in the stomach ~39% (nl <30%). Seen by Dr Sharlett Iles - recommended to start Domperidone a that time >> could not afford.  - PN  PLAN:  1. Patient with long-standing, uncontrolled diabetes, on basal insulin and Glipizide. Sugars are still high as she is noncompliant with the Novolog - again discussed about how to add mealtime insulin >> she will start. - I suggested:  Patient Instructions  Please continue: - Glipizide XL 5 mg daily - Lantus 35 units at night   Please add: - Novolog 7-9 units with breakfast, lunch and dinner.   Continue: - NovoLog Sliding scale - add to the above doses: - 150-175: + 1 unit  - 176-200: + 2 units  - 201-225: + 3 units  - 226-250: + 4 units  - 251-275: + 5 units - 276-300: + 6 units  Please come back for a follow-up appointment in 3 months.  - continue checking her sugars at different times of the day - check 2-3 times a day, rotating checks  - she is due for yearly eye exams! (not covered now by her insurance) - refilled her insulins - HbA1c today: 10.6% - slightly higher (will not check the fructosamine today) - Return to clinic in 3 mo with sugar log

## 2016-01-06 NOTE — Patient Instructions (Signed)
Please continue: - Glipizide XL 5 mg daily - Lantus 35 units at night   Please add: - Novolog 7-9 units with breakfast, lunch and dinner.   Continue: - NovoLog Sliding scale - add to the above doses: - 150-175: + 1 unit  - 176-200: + 2 units  - 201-225: + 3 units  - 226-250: + 4 units  - 251-275: + 5 units - 276-300: + 6 units  Please come back for a follow-up appointment in 3 months.

## 2016-04-07 ENCOUNTER — Encounter: Payer: Self-pay | Admitting: Internal Medicine

## 2016-04-07 ENCOUNTER — Ambulatory Visit (INDEPENDENT_AMBULATORY_CARE_PROVIDER_SITE_OTHER): Payer: Managed Care, Other (non HMO) | Admitting: Internal Medicine

## 2016-04-07 VITALS — BP 110/72 | HR 79 | Ht 66.0 in | Wt 247.0 lb

## 2016-04-07 DIAGNOSIS — Z23 Encounter for immunization: Secondary | ICD-10-CM

## 2016-04-07 DIAGNOSIS — E1142 Type 2 diabetes mellitus with diabetic polyneuropathy: Secondary | ICD-10-CM | POA: Diagnosis not present

## 2016-04-07 LAB — POCT GLYCOSYLATED HEMOGLOBIN (HGB A1C): Hemoglobin A1C: 10.4

## 2016-04-07 MED ORDER — INSULIN REGULAR HUMAN 100 UNIT/ML IJ SOLN: 7.0000 [IU] | Freq: Three times a day (TID) | INTRAMUSCULAR | 3 refills | Status: DC

## 2016-04-07 MED ORDER — "INSULIN SYRINGE-NEEDLE U-100 31G X 5/16"" 0.3 ML MISC": 3 refills | Status: DC

## 2016-04-07 NOTE — Progress Notes (Signed)
Patient ID: Alexa Woods, female   DOB: 1960/06/16, 55 y.o.   MRN: [id number]  HPI: Alexa Woods is a 55 y.o.-year-old female, returning for f/u for DM2 dx 1997, insulin-dependent since 2010, uncontrolled, with  complications (gastroparesis - dx 2012, PN). Last visit 3 mo ago.  As at previous appointments, she again have a lot of complaints and mentions a lot of stress in her life. Her diabetes remains uncontrolled, and is difficult to tell whether she is taking her NovoLog or not. She is giving me conflicting info about how she is taking her NovoLog, she is either telling me that she only takes it when the sugars are very high, or telling me that she is taking it consistently, or that she is barely eating, so not taking it... She is mentioning that she cannot afford the NovoLog fully, and I feel that this may be the reason why she is not taking it consistently.  Last hemoglobin A1c was: 10/01/2015: HbA1c calculated from fructosamine is 8.9%. Lab Results  Component Value Date   HGBA1C 10.9 01/06/2016   HGBA1C 10.2 10/01/2015   HGBA1C 10.8 06/24/2015   She is on: - Glipizide XL 5 mg daily - Lantus 35 units at night  - Novolog 7-9 units with breakfast, lunch and dinner >> ? Unclear if she is really taking this consistently - NovoLog Sliding scale - add to the above doses: - 150-175: + 1 unit  - 176-200: + 2 units  - 201-225: + 3 units  - 226-250: + 4 units  - 251-275: + 5 units - 276-300: + 6 units Could not tolerate Metformin >> GI upset. (N+V) Tried Januvia >> nausea.  Pt checks her sugars 1-2x day and they are - per review of her log: - am: 83-134, 163 >> 112-207 >> 150-190s >> 140-160 >> 97, 124-197  >> 160s, 200s >> 109, 145- 164 - after b'fast: 193, 200 ? >>  >> 131, 307 >> 196-241 >> 180s >> 170-200s >> 164-207 >> n/c >> 151-219 - before lunch: 134-162, 193 >> 121-160 >> 156-220 >> 160-240 >> n/c >> 158, 200 >> n/c  - after lunch:116-165, 329 >> 164-223 >> 170s >> 190-200s >>  124-206, 270  >> 200s >> 163-227, 326 - before dinner: 133-209, 220 >> 142, 143 >> 179-267 >> 160s >> n/c >> 118-155 >> n/c >> n/c - after dinner: 163-257 >> 151-179, 314 >> 193-210 >> 250s >> 170-180 >> 149-186, 225 >> n/c >> 211-233 - bedtime: 200s >> 116-230 >> 155, 274 >> n/c >> 173, 215 >> n/c Lowest sugar was 109; she has hypoglycemia awareness at 90.  Highest sugar was upper 300s  Pt's meals are: - Breakfast: bowl of cereal (rice krispies, lucky charms) with milk 2% - Lunch: PB sandwich - Dinner: pork chop + rice/potatoes + green beans  - Snacks: no; smtms apples or bananas She is limited in what she can eat due to her gastroparesis.  - no CKD, last BUN/creatinine:  Lab Results  Component Value Date   BUN 13 07/20/2015   CREATININE 0.89 07/20/2015  Not an an ACEI. Last ACR 0.3 in 04/2012. - last set of lipids: Lab Results  Component Value Date   CHOL 212 (H) 06/24/2015   HDL 71.50 06/24/2015   LDLCALC 110 (H) 06/24/2015   LDLDIRECT 126.8 09/23/2012   TRIG 155.0 (H) 06/24/2015   CHOLHDL 3 06/24/2015  Not on a Statin. - last eye exam was in ~2009. No DR. Cannot afford  eye exam , does have blurry vision. - + numbness and tingling in her feet.  She had an endoscopy >> started on a new med for gastroparesis >> cannot remember name.  I reviewed pt's medications, allergies, PMH, social hx, family hx, and changes were documented in the history of present illness. Otherwise, unchanged from my initial visit note.  ROS: Constitutional: + weight gain, + fatigue, + subjective hyperthermia, + poor sleep Eyes: + blurry vision, no xerophthalmia ENT: + sore throat, no nodules palpated in throat, no dysphagia/no odynophagia, + hoarseness Cardiovascular: + CP/no SOB/no palpitations/+ leg swelling Respiratory: +  Cough/no SOB/+ wheezing Gastrointestinal: No N/+ V/no D/+ C/+ heartburn Musculoskeletal: + both: muscle/joint aches Skin: + rash Neurological: no  tremors/numbness/tingling/dizziness,+  HA  PE: BP 110/72   Pulse 79   Ht 5\' 6"  (1.676 m)   Wt 247 lb (112 kg)   SpO2 94%   BMI 39.87 kg/m  Body mass index is 39.87 kg/m. 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 DM2, insulin-dependent, uncontrolled, with complications - gastroparesis - 07/2010 - At 120 minutes, the amount of tracer remaining in the stomach ~39% (nl <30%). Seen by Dr Sharlett Iles - recommended to start Domperidone a that time >> could not afford.  - PN  PLAN:  1. Patient with long-standing, uncontrolled diabetes, on basal insulin and Glipizide. Sugars are still high as she is noncompliant with the Novolog. As mentioned above, she does not give me consistent information about whether she is taking this or not. She did tell me that this is expensive, so I will switch to regular insulin, but I cannot make other changes at this point. - again discussed about how to take the insulin with meals: Check her sugar first, think about whether she will need to large or a small meal, then add the mealtime insulin with the sliding scale correction, inject this, and then eat 15-30 minutes later, depending on the insulin that she uses. In her log, her sugars are checked after meals, so she is not taking the sliding scale correctly, despite multiple explanations about how to take this.  - I did discuss with the patient today that she absolutely needs to take control of her  diabetes. She always blames stress on her diabetes control, but stress is present every day, so we need to take this into account while we calculate insulin doses. Until she understands that she is the responsible factor in getting her diabetes under control, we will not have good success with this. - I suggested:  Patient Instructions  Please continue: - Glipizide XL 5 mg daily - Lantus 35 units at night   Please switch from Novolog to R insulin: - 7-9 units with breakfast, lunch and dinner.  - R insulin Sliding scale - add to the above doses: - 150-175: + 1 unit  - 176-200: + 2 units  - 201-225: + 3 units  - 226-250: + 4 units  - 251-275: + 5 units - 276-300: + 6 units  Please inject R insulin 30 min before meals.  Check sugars before meals.  Please come back for a follow-up appointment in 3 months.  - continue checking her sugars at different times of the day - check 2-3 times a day, rotating checks  - she is due for yearly eye exams! (not covered now by her insurance) - Given flu shot today  - HbA1c today:  Lab Results  Component Value Date   HGBA1C 10.4 04/07/2016  - Return to clinic in 3 mo with sugar log   - time spent with the patient: 40 minutes, of which >50% was spent in reviewing her sugar log, reviewing her insulin regimen (this was a lengthy discussion as she gives conflicting info), designing a new insulin regimen, and how to calculate doses, discussing about the need to gain control of her diabetes and possible consequences of not getting this under control.  [name], MD PhD [facility]

## 2016-04-07 NOTE — Patient Instructions (Addendum)
Please continue: - Glipizide XL 5 mg daily - Lantus 35 units at night   Please switch from Novolog to R insulin: - 7-9 units with breakfast, lunch and dinner.  - R insulin Sliding scale - add to the above doses: - 150-175: + 1 unit  - 176-200: + 2 units  - 201-225: + 3 units  - 226-250: + 4 units  - 251-275: + 5 units - 276-300: + 6 units  Please inject R insulin 30 min before meals.  Check sugars before meals.  Please come back for a follow-up appointment in 3 months.

## 2016-04-14 ENCOUNTER — Telehealth: Payer: Self-pay | Admitting: Internal Medicine

## 2016-04-14 NOTE — Telephone Encounter (Signed)
Please advise, thanks.

## 2016-04-14 NOTE — Telephone Encounter (Signed)
Pt believes the insulin is too high her BS last night 59.  She feels that when she takes too much she gets so sick and it is a cycle of panic

## 2016-04-14 NOTE — Telephone Encounter (Signed)
Stay with 7 units of R insulin with dinner (do not increase to 9 units) and if still has lows at night, may need to reduce Lantus by 4 units. Let us know how this goes in the next week.

## 2016-04-16 NOTE — Telephone Encounter (Signed)
Pt was notified of instructions, pt verbalized understanding.   

## 2016-05-04 ENCOUNTER — Other Ambulatory Visit: Payer: Self-pay | Admitting: Internal Medicine

## 2016-05-12 ENCOUNTER — Telehealth: Payer: Self-pay | Admitting: Internal Medicine

## 2016-05-12 NOTE — Telephone Encounter (Signed)
Pt called in regarding the form she dropped off yesterday. It was saying her insurance isn't going to cover her insulin anymore and they are wanting her to go back to Levemir (which she has had before) she needs to know if this can be called in for her because she is almost out of insulin.

## 2016-05-12 NOTE — Telephone Encounter (Signed)
Ok to call in Levemir 35 units per day

## 2016-05-13 MED ORDER — INSULIN DETEMIR 100 UNIT/ML ~~LOC~~ SOLN: 35.0000 [IU] | Freq: Every day | SUBCUTANEOUS | 11 refills | Status: DC

## 2016-05-13 NOTE — Telephone Encounter (Signed)
Called patient to inform sent Levemir Rx to [facility].  No answer.

## 2016-05-27 ENCOUNTER — Other Ambulatory Visit: Payer: Self-pay | Admitting: Family Medicine

## 2016-05-27 DIAGNOSIS — Z1231 Encounter for screening mammogram for malignant neoplasm of breast: Secondary | ICD-10-CM

## 2016-06-05 ENCOUNTER — Emergency Department
Admission: EM | Admit: 2016-06-05 | Discharge: 2016-06-05 | Disposition: A | Discharge status: Home / Self Care | Payer: Managed Care, Other (non HMO) | Attending: Emergency Medicine | Admitting: Emergency Medicine

## 2016-06-05 ENCOUNTER — Emergency Department: Payer: Managed Care, Other (non HMO)

## 2016-06-05 ENCOUNTER — Telehealth: Payer: Self-pay | Admitting: Family Medicine

## 2016-06-05 ENCOUNTER — Encounter: Payer: Self-pay | Admitting: Emergency Medicine

## 2016-06-05 DIAGNOSIS — Z794 Long term (current) use of insulin: Secondary | ICD-10-CM | POA: Insufficient documentation

## 2016-06-05 DIAGNOSIS — I10 Essential (primary) hypertension: Secondary | ICD-10-CM | POA: Diagnosis not present

## 2016-06-05 DIAGNOSIS — Z79899 Other long term (current) drug therapy: Secondary | ICD-10-CM | POA: Insufficient documentation

## 2016-06-05 DIAGNOSIS — E119 Type 2 diabetes mellitus without complications: Secondary | ICD-10-CM | POA: Insufficient documentation

## 2016-06-05 DIAGNOSIS — R8271 Bacteriuria: Secondary | ICD-10-CM | POA: Diagnosis not present

## 2016-06-05 DIAGNOSIS — Z87891 Personal history of nicotine dependence: Secondary | ICD-10-CM | POA: Insufficient documentation

## 2016-06-05 DIAGNOSIS — R1012 Left upper quadrant pain: Secondary | ICD-10-CM | POA: Diagnosis present

## 2016-06-05 DIAGNOSIS — J45909 Unspecified asthma, uncomplicated: Secondary | ICD-10-CM | POA: Diagnosis not present

## 2016-06-05 DIAGNOSIS — R109 Unspecified abdominal pain: Secondary | ICD-10-CM | POA: Insufficient documentation

## 2016-06-05 LAB — CBC
HCT: 42.9 % (ref 35.0–47.0)
HEMOGLOBIN: 14.7 g/dL (ref 12.0–16.0)
MCH: 27.7 pg (ref 26.0–34.0)
MCHC: 34.3 g/dL (ref 32.0–36.0)
MCV: 80.8 fL (ref 80.0–100.0)
PLATELETS: 203 10*3/uL (ref 150–440)
RBC: 5.31 MIL/uL — AB (ref 3.80–5.20)
RDW: 13.7 % (ref 11.5–14.5)
WBC: 7.4 10*3/uL (ref 3.6–11.0)

## 2016-06-05 LAB — COMPREHENSIVE METABOLIC PANEL
ALK PHOS: 152 U/L — AB (ref 38–126)
ALT: 26 U/L (ref 14–54)
ANION GAP: 6 (ref 5–15)
AST: 21 U/L (ref 15–41)
Albumin: 4.6 g/dL (ref 3.5–5.0)
BILIRUBIN TOTAL: 0.6 mg/dL (ref 0.3–1.2)
BUN: 13 mg/dL (ref 6–20)
CALCIUM: 9.7 mg/dL (ref 8.9–10.3)
CO2: 29 mmol/L (ref 22–32)
Chloride: 101 mmol/L (ref 101–111)
Creatinine, Ser: 0.89 mg/dL (ref 0.44–1.00)
GFR calc non Af Amer: >60 mL/min (ref >60)
Glucose, Bld: 327 mg/dL — ABNORMAL HIGH (ref 65–99)
Potassium: 3.2 mmol/L — ABNORMAL LOW (ref 3.5–5.1)
Sodium: 136 mmol/L (ref 135–145)
Total Protein: 9.2 g/dL — ABNORMAL HIGH (ref 6.5–8.1)

## 2016-06-05 LAB — URINALYSIS, COMPLETE (UACMP) WITH MICROSCOPIC
BILIRUBIN URINE: NEGATIVE
Ketones, ur: NEGATIVE mg/dL
Leukocytes, UA: NEGATIVE
Nitrite: NEGATIVE
Protein, ur: NEGATIVE mg/dL
SPECIFIC GRAVITY, URINE: 1.017 (ref 1.005–1.030)
pH: 6 (ref 5.0–8.0)

## 2016-06-05 LAB — LIPASE, BLOOD: Lipase: 23 U/L (ref 11–51)

## 2016-06-05 MED ORDER — AMOXICILLIN-POT CLAVULANATE 875-125 MG PO TABS: 1.0000 | ORAL_TABLET | Freq: Two times a day (BID) | ORAL | 0 refills | Status: DC

## 2016-06-05 MED ORDER — SODIUM CHLORIDE 0.9 % IV BOLUS (SEPSIS)
500.0000 mL | Freq: Once | INTRAVENOUS | Status: AC
  Administered 2016-06-05: 500 mL via INTRAVENOUS

## 2016-06-05 MED ORDER — OXYCODONE-ACETAMINOPHEN 5-325 MG PO TABS
1.0000 | ORAL_TABLET | ORAL | Status: AC
  Administered 2016-06-05: 1 via ORAL
  Filled 2016-06-05: qty 1

## 2016-06-05 MED ORDER — ONDANSETRON 4 MG PO TBDP: 4.0000 mg | ORAL_TABLET | Freq: Four times a day (QID) | ORAL | 0 refills | Status: DC | PRN

## 2016-06-05 NOTE — Telephone Encounter (Signed)
Will watch for records 

## 2016-06-05 NOTE — Telephone Encounter (Signed)
Patient Name: RIYAQ RYLANT DOB: [birth year] Initial Comment Caller states she has lower left chest pain Nurse Assessment Nurse: Ronnald Ramp, RN, [facility] Date/Time (Eastern Time): 06/05/2016 10:25:06 AM Confirm and document reason for call. If symptomatic, describe symptoms. ---Caller states she has a history of left upper abdomen/chest pain off and on, worse the last 4 days. She has been seen at ED in the past and has been told this is related to constipation. Her last BM was yesterday but it was hard. Does the patient have any new or worsening symptoms? ---Yes Will a triage be completed? ---Yes Related visit to physician within the last 2 weeks? ---No Does the PT have any chronic conditions? (i.e. diabetes, asthma, etc.) ---Yes List chronic conditions. ---Diabetes, Constipation, Depression, Chronic pain, Asthma Is the patient pregnant or possibly pregnant? (Ask all females between the ages of 80-55) ---No Is this a behavioral health or substance abuse call? ---No Guidelines Guideline Title Affirmed Question Affirmed Notes Abdominal Pain - Upper [1] Pain lasts > 10 minutes AND [2] age > 11 Final Disposition User Go to ED Now Ronnald Ramp, RN, [facility] - ED Disagree/Comply: Comply

## 2016-06-05 NOTE — ED Provider Notes (Signed)
[facility] Emergency Department Provider Note   ____________________________________________   First MD Initiated Contact with Patient 06/05/16 1446     (approximate)  I have reviewed the triage vital signs and the nursing notes.   HISTORY  Chief Complaint Abdominal Pain    HPI Alexa Woods is a 56 y.o. female here for evaluation of left flank pain. Patient reports that she's noticed pain in the left mid to flank region for about the last 3 days, but reports she's had about the exact same pain for over a year now. She was seen in the ER once before for.  Denies any chest pain, she notified triage that she was short of breath, however she denies feeling short of breath or having any wheezing or other symptoms to me.  No fevers or chills. No nausea no vomiting. She does report the last 3 days she's noticed a burning feeling with urination, no blood in the urine.Noticed her blood sugars also been running slightly higher last few days.  No history of any blood clots. No pain with deep inspiration. Denies any pain in the chest.  Past Medical History:  Diagnosis Date  . Alcohol abuse, unspecified   . Allergic rhinitis, cause unspecified   . Anxiety state, unspecified   . Backache, unspecified   . Depressive disorder, not elsewhere classified   . Dysphagia, unspecified(787.20)   . Edema   . Epigastric abdominal pain   . Esophageal reflux   . Gastroparesis   . GERD (gastroesophageal reflux disease)   . Herpes zoster without mention of complication   . History of Bell's palsy 4/09  . Hypopotassemia   . IBS (irritable bowel syndrome)   . Morbid obesity ([place])   . Myocardial infarction   . Other chronic cystitis   . Other psoriasis   . Other screening mammogram   . Pain in joint, lower leg   . Type II or unspecified type diabetes mellitus without mention of complication, not stated as uncontrolled   . Unspecified asthma(493.90)   . Unspecified  essential hypertension   . Unspecified hemorrhoids without mention of complication   . Wrist fracture     Patient Active Problem List   Diagnosis Date Noted  . Dysuria 08/02/2015  . LUQ abdominal pain 07/19/2014  . Infected sebaceous cyst 02/26/2014  . Joint swelling 12/20/2013  . Leg pain, bilateral 11/28/2013  . Paresthesia of skin 02/08/2013  . Periodontal disease 02/03/2013  . Hyperlipidemia, mild 09/27/2012  . Gastritis and gastroduodenitis 08/02/2012  . Rash of hands 05/23/2012  . 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 Lung density on x-ray 02/23/2011  . DYSPHAGIA UNSPECIFIED 08/07/2010  . Low back pain 07/22/2010  . Obesity 03/30/2008  . 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  . Type 2 diabetes mellitus with peripheral neuropathy ([place]) 11/30/2006  . ANXIETY 11/30/2006  . History of alcohol abuse 11/30/2006  . DEPRESSION 11/30/2006  . Essential hypertension 11/30/2006  . HEMORRHOIDS 11/30/2006  . ALLERGIC RHINITIS 11/30/2006  . ASTHMA 11/30/2006  . GERD 11/30/2006  . CYSTITIS, CHRONIC 11/30/2006  . PSORIASIS 11/30/2006    Past Surgical History:  Procedure Laterality Date  . APPENDECTOMY    . CHOLECYSTECTOMY    . DOBUTAMINE STRESS ECHO  2009    normal stress echo, no evidence of ischemia  . ELBOW SURGERY Right    for nerve damage  . ESOPHAGOGASTRODUODENOSCOPY  07/2002   erythematous gastropathy  . KNEE ARTHROSCOPY W/ PARTIAL MEDIAL MENISCECTOMY Right 10/2008  and chondroplasty  . MRI  11/2102   pt states she has a bone spur on her spine  . PARTIAL HYSTERECTOMY    . SHOULDER SURGERY Left 05/2010   Dr. Alphonzo Severance  . TOTAL KNEE ARTHROPLASTY  2011   Dr. Ricki Rodriguez    Prior to Admission medications   Medication Sig Start Date End Date Taking? Authorizing Provider  albuterol (PROVENTIL HFA;VENTOLIN HFA) 108 (90 Base) MCG/ACT inhaler Inhale 2 puffs into the lungs every 6 (six) hours as needed for wheezing or shortness of breath. 08/02/15   Abner Greenspan, MD    amoxicillin-clavulanate (AUGMENTIN) 875-125 MG tablet Take 1 tablet by mouth 2 (two) times daily. 06/05/16   Delman Kitten, MD  dicyclomine (BENTYL) 10 MG capsule Take 1 capsule (10 mg total) by mouth 3 (three) times daily as needed for spasms. 03/18/15   Lori P Hvozdovic, PA-C  glipiZIDE (GLUCOTROL XL) 5 MG 24 hr tablet TAKE 1 TABLET BY MOUTH DAILY WITH BREAKFAST 05/05/16   [name], MD  GLOBAL INJECT EASE INSULIN SYR 31G X 5/16" 0.5 ML MISC  12/02/15   Historical Provider, MD  glucose blood (ONE TOUCH ULTRA TEST) test strip Use to test blood sugar 4 times daily as instructed. 11/09/13   [name], MD  hydrochlorothiazide (HYDRODIURIL) 25 MG tablet Take 1 tablet (25 mg total) by mouth daily. 08/02/15   Abner Greenspan, MD  insulin aspart (NOVOLOG) 100 UNIT/ML injection Inject 8-10 Units into the skin 3 (three) times daily before meals. Plus sliding scale 01/06/16   [name], MD  insulin detemir (LEVEMIR) 100 UNIT/ML injection Inject 0.35 mLs (35 Units total) into the skin daily. 05/13/16   [name], MD  insulin glargine (LANTUS) 100 UNIT/ML injection Inject 0.35 mLs (35 Units total) into the skin at bedtime. 01/06/16   [name], MD  Insulin Pen Needle (LEADER UNIFINE PENTIPS PLUS) 31G X 5 MM MISC Use twice daily as instructed. 07/04/15   [name], MD  insulin regular (HUMULIN R) 100 units/mL injection Inject 0.07-0.09 mLs (7-9 Units total) into the skin 3 (three) times daily before meals. 04/07/16   [name], MD  Insulin Syringe-Needle U-100 (B-D INSULIN SYRINGE) 31G X 5/16" 0.3 ML MISC Use 3x a day 04/07/16   [name], MD  Lancets [facility] ULTRASOFT) lancets Use to test blood sugar 4 times daily as instructed. 11/09/13   [name], MD  LORazepam (ATIVAN) 1 MG tablet Take 1 mg by mouth every 8 (eight) hours as needed for anxiety or sleep.     Historical Provider, MD  ondansetron (ZOFRAN ODT) 4 MG disintegrating tablet Take 1 tablet (4 mg total) by  mouth every 6 (six) hours as needed for nausea or vomiting. 06/05/16   Delman Kitten, MD  oxyCODONE-acetaminophen (PERCOCET) 10-325 MG tablet  01/01/16   Historical Provider, MD  pantoprazole (PROTONIX) 40 MG tablet Take 1 tablet (40 mg total) by mouth 2 (two) times daily. 12/31/14   Lori P Hvozdovic, PA-C  perphenazine (TRILAFON) 2 MG tablet Take 2 mg by mouth at bedtime. 03/08/13   Historical Provider, MD  potassium chloride (K-DUR,KLOR-CON) 10 MEQ tablet Take 1 tablet (10 mEq total) by mouth daily. 08/02/15   Abner Greenspan, MD  sertraline (ZOLOFT) 100 MG tablet Take 200 mg by mouth daily.     Historical Provider, MD  tiZANidine (ZANAFLEX) 4 MG tablet Take 4 mg by mouth 2 (two) times daily.  07/10/14   Historical Provider, MD  zolpidem (AMBIEN) 10 MG  tablet Take 15 mg by mouth at bedtime as needed for sleep.     Historical Provider, MD    Allergies Bupropion hcl; Cefuroxime axetil; Codeine; Lidocaine; Metformin; Paroxetine; Propoxyphene n-acetaminophen; Sulfonamide derivatives; and Tramadol hcl  Family History  Problem Relation Age of Onset  . Alcohol abuse Mother   . Hypertension Mother   . Esophageal cancer Maternal Grandmother   . Lung cancer      mat great uncle  . Colon cancer Neg Hx     Social History Social History  Substance Use Topics  . Smoking status: Former Smoker    Quit date: 12/30/2001  . Smokeless tobacco: Never Used  . Alcohol use No     Comment: former alcoholic     Review of Systems Constitutional: No fever/chills Eyes: No visual changes. ENT: No sore throat. Cardiovascular: Denies chest pain. Respiratory: Denies shortness of breath. Gastrointestinal: No abdominal painExcept for on her left flank and back.  No nausea, no vomiting.  No diarrhea.  No constipation. Genitourinary: See history of present illness. Previous hysterectomy. Musculoskeletal: Negative for back pain. Skin: Negative for rash. Neurological: Negative for headaches, focal weakness or  numbness.  10-point ROS otherwise negative.  ____________________________________________   PHYSICAL EXAM:  VITAL SIGNS: ED Triage Vitals  Enc Vitals Group     BP 06/05/16 1211 (!) 170/85     Pulse Rate 06/05/16 1208 90     Resp 06/05/16 1208 16     Temp 06/05/16 1208 98.1 F (36.7 C)     Temp Source 06/05/16 1208 Oral     SpO2 06/05/16 1208 97 %     Weight 06/05/16 1209 247 lb (112 kg)     Height 06/05/16 1209 5\' 6"  (1.676 m)     Head Circumference --      Peak Flow --      Pain Score 06/05/16 1209 7     Pain Loc --      Pain Edu? --      Excl. in [place]? --     Constitutional: Alert and oriented. Well appearing and in no acute distress. Eyes: Conjunctivae are normal. PERRL. EOMI. Head: Atraumatic. Nose: No congestion/rhinnorhea. Mouth/Throat: Mucous membranes are moist.  Oropharynx non-erythematous. Neck: No stridor.   Cardiovascular: Normal rate, regular rhythm. Grossly normal heart sounds.  Good peripheral circulation. Respiratory: Normal respiratory effort.  No retractions. Lungs CTAB. Gastrointestinal: Soft and nontenderSet for some mild to moderate discomfort to deep palpation in the left upper quadrant and left flank region.. No distention. No abdominal bruits. Mild left sided CVA tenderness, none on the right. Musculoskeletal: No lower extremity tenderness nor edema.  No joint effusions. Neurologic:  Normal speech and language. No gross focal neurologic deficits are appreciated. No gait instability. Skin:  Skin is warm, dry and intact. No rash noted. Psychiatric: Mood and affect are normal. Speech and behavior are normal.  ____________________________________________   LABS (all labs ordered are listed, but only abnormal results are displayed)  Labs Reviewed  COMPREHENSIVE METABOLIC PANEL - Abnormal; Notable for the following:       Result Value   Potassium 3.2 (*)    Glucose, Bld 327 (*)    Total Protein 9.2 (*)    Alkaline Phosphatase 152 (*)    All other  components within normal limits  CBC - Abnormal; Notable for the following:    RBC 5.31 (*)    All other components within normal limits  URINALYSIS, COMPLETE (UACMP) WITH MICROSCOPIC - Abnormal; Notable for the  following:    Color, Urine YELLOW (*)    APPearance CLEAR (*)    Glucose, UA >=500 (*)    Hgb urine dipstick SMALL (*)    Bacteria, UA RARE (*)    Squamous Epithelial / LPF 0-5 (*)    All other components within normal limits  URINE CULTURE  LIPASE, BLOOD   ____________________________________________  EKG  ED ECG REPORT I, Asia Favata, the attending physician, personally viewed and interpreted this ECG.  Date: 06/05/2016 EKG Time: 12:30 Rate: 80 Rhythm: normal sinus rhythm QRS Axis: normal Intervals: normal ST/T Wave abnormalities: normal Conduction Disturbances: First-degree AV block  Narrative Interpretation: unremarkable except for a first-degree AV block  ____________________________________________  RADIOLOGY  Dg Chest 2 View  Result Date: 06/05/2016 CLINICAL DATA:  Cough and chest tightness for 3 days, initial encounter EXAM: CHEST  2 VIEW COMPARISON:  09/03/2014 FINDINGS: The heart size and mediastinal contours are within normal limits. Both lungs are clear. The visualized skeletal structures are unremarkable. IMPRESSION: No active cardiopulmonary disease. Electronically Signed   By: Inez Catalina M.D.   On: 06/05/2016 15:47   Ct Renal Stone Study  Result Date: 06/05/2016 CLINICAL DATA:  LEFT upper quadrant abdominal pain radiating to back for while worse over last 2-3 days, some shortness of breath, LEFT flank pain, history type II diabetes mellitus, MI, gastric para cysts, irritable bowel syndrome, GERD EXAM: CT ABDOMEN AND PELVIS WITHOUT CONTRAST TECHNIQUE: Multidetector CT imaging of the abdomen and pelvis was performed following the standard protocol without IV contrast. Sagittal and coronal MPR images reconstructed from axial data set. Oral contrast not  administered. COMPARISON:  07/20/2015 FINDINGS: Lower chest: Lung bases clear Hepatobiliary: Gallbladder surgically absent. Liver normal appearance Pancreas: Normal appearance Spleen: Normal appearance Adrenals/Urinary Tract: Adrenal glands normal appearance. Small BILATERAL renal cysts. No definite urinary tract calcification, hydronephrosis or ureteral dilatation. Kidneys, ureters and bladder otherwise normal appearance. Stomach/Bowel: Appendix not visualized, by history surgically absent. Stomach and bowel loops normal appearance. Vascular/Lymphatic: Atherosclerotic calcification aorta without aneurysm. No adenopathy. Reproductive: Uterus surgically absent with normal sized RIGHT ovary and a stable calcification within LEFT ovary. Other: No free air or free fluid. Musculoskeletal: Question mild osseous demineralization. No acute bony findings. 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 IMPRESSION: No acute intra- abdominal or intrapelvic abnormalities. Small BILATERAL renal cysts. Intramuscular lipoma LEFT sartorius. Electronically Signed   By: Lavonia Dana M.D.   On: 06/05/2016 16:17    ____________________________________________   PROCEDURES  Procedure(s) performed: None  Procedures  Critical Care performed: No  ____________________________________________   INITIAL IMPRESSION / ASSESSMENT AND PLAN / ED COURSE  Pertinent labs & imaging results that were available during my care of the patient were reviewed by me and considered in my medical decision making (see chart for details).  Differential diagnosis includes but is not limited to, abdominal perforation, aortic dissection, cholecystitis, appendicitis, diverticulitis, colitis, esophagitis/gastritis, kidney stone, pyelonephritis, urinary tract infection, aortic aneurysm. All are considered in decision and treatment plan. Based upon the patient's presentation and risk factors, I am highly suspicious  she may have urinary tract infection or possible pyelonephritis given the associated dysuria with left flank pain. Await urinalysis.  Patient not driving. Takes oxycodone at home, given 1 dose here.  Denies any cardiac or pulmonary symptoms to myself. Normal respirations, no evidence of shortness of breath. No notable risk factors for pulmonary embolism, and the patient denies having any chest symptoms. Appears the likely etiology is some  type of intra-abdominal pain referring to the left flank.   Clinical Course     Patient some back tearing noted in urine. Given associated dysuria with left flank pain, I we'll culture urine and we will treat her with Augmentin for possible uterine tract infection/potential early pyelonephritis. Urine culture is pending, careful return precautions discussed the patient is in agreement. Return precautions and treatment recommendations and follow-up discussed with the patient who is agreeable with the plan.  ____________________________________________   FINAL CLINICAL IMPRESSION(S) / ED DIAGNOSES  Final diagnoses:  Bacteria in urine  Left flank pain      NEW MEDICATIONS STARTED DURING THIS VISIT:  New Prescriptions   AMOXICILLIN-CLAVULANATE (AUGMENTIN) 875-125 MG TABLET    Take 1 tablet by mouth 2 (two) times daily.   ONDANSETRON (ZOFRAN ODT) 4 MG DISINTEGRATING TABLET    Take 1 tablet (4 mg total) by mouth every 6 (six) hours as needed for nausea or vomiting.     Note:  This document was prepared using Dragon voice recognition software and may include unintentional dictation errors.     Delman Kitten, MD 06/05/16 [phone]

## 2016-06-05 NOTE — Telephone Encounter (Signed)
Per chart review tab pt went to [facility] ED. 

## 2016-06-05 NOTE — Discharge Instructions (Signed)

## 2016-06-05 NOTE — ED Triage Notes (Signed)
Patient states that she has been having LUQ abdominal pain for "a while" but it got worse 2-3 days ago. Patient states that yesterday she started having some shortness of breath.   Patient in NAD in triage, breathing is equal and unlabored, color WNL.

## 2016-06-07 LAB — URINE CULTURE: Special Requests: NORMAL

## 2016-06-12 ENCOUNTER — Ambulatory Visit: Payer: Managed Care, Other (non HMO)

## 2016-07-09 ENCOUNTER — Encounter: Payer: Self-pay | Admitting: Internal Medicine

## 2016-07-09 ENCOUNTER — Ambulatory Visit (INDEPENDENT_AMBULATORY_CARE_PROVIDER_SITE_OTHER): Payer: Managed Care, Other (non HMO) | Admitting: Internal Medicine

## 2016-07-09 VITALS — BP 138/88 | HR 75 | Wt 250.0 lb

## 2016-07-09 DIAGNOSIS — E1142 Type 2 diabetes mellitus with diabetic polyneuropathy: Secondary | ICD-10-CM | POA: Diagnosis not present

## 2016-07-09 LAB — BASIC METABOLIC PANEL WITH GFR
BUN: 18 mg/dL (ref 7–25)
CHLORIDE: 101 mmol/L (ref 98–110)
CO2: 31 mmol/L (ref 20–31)
Calcium: 9.4 mg/dL (ref 8.6–10.4)
Creat: 0.96 mg/dL (ref 0.50–1.05)
GFR, EST NON AFRICAN AMERICAN: 68 mL/min (ref >=60)
GFR, Est African American: 78 mL/min (ref >=60)
Glucose, Bld: 153 mg/dL — ABNORMAL HIGH (ref 65–99)
POTASSIUM: 4.1 mmol/L (ref 3.5–5.3)
Sodium: 140 mmol/L (ref 135–146)

## 2016-07-09 LAB — LIPID PANEL
CHOLESTEROL: 210 mg/dL — AB (ref 0–200)
HDL: 69.4 mg/dL (ref >39.00)
LDL CALC: 115 mg/dL — AB (ref 0–99)
NonHDL: 140.2
Total CHOL/HDL Ratio: 3
Triglycerides: 128 mg/dL (ref 0.0–149.0)
VLDL: 25.6 mg/dL (ref 0.0–40.0)

## 2016-07-09 NOTE — Progress Notes (Addendum)
Patient ID: Alexa Woods, female   DOB: [birth year], 56 y.o.   MRN: [id number]  HPI: [name] is a 56 y.o.-year-old female, returning for f/u for DM2 dx 1997, insulin-dependent since 2010, uncontrolled, with  complications (gastroparesis - dx 2012, PN). Last visit 3 mo ago.  Since last visit, she had a kidney infection.  As at previous appointments, she again have a lot of complaints and mentions a lot of stress in her life. Her diabetes remains uncontrolled, and is difficult to tell whether she is taking her NovoLog or not. She is giving me conflicting info about how she is taking her NovoLog, she is either telling me that she only takes it when the sugars are very high, or telling me that she is taking it consistently, or that she is barely eating, so not taking it... She is mentioning that she cannot afford the NovoLog fully, and I feel that this may be the reason why she is not taking it consistently.  Last hemoglobin A1c was: 10/01/2015: HbA1c calculated from fructosamine is 8.9%. Lab Results  Component Value Date   HGBA1C 10.4 04/07/2016   HGBA1C 10.9 01/06/2016   HGBA1C 10.2 10/01/2015   She is on: - Glipizide XL 5 mg daily - Levemir 35 units at night  - R insulin: - Mealtime insulin: 7-9 units with breakfast, lunch and dinner.  - Sliding scale - add to the above doses: - 150-175: + 1 unit  - 176-200: + 2 units  - 201-225: + 3 units  - 226-250: + 4 units  - 251-275: + 5 units - 276-300: + 6 units Could not tolerate Metformin >> GI upset. (N+V) Tried Januvia >> nausea.  Pt checks her sugars 1-2x day and they are - per review of her log: - am: 150-190s >> 140-160 >> 97, 124-197  >> 160s, 200s >> 109, 145- 164 >> 111-198, 231 - after b'fast:  196-241 >> 180s >> 170-200s >> 164-207 >> n/c >> 151-219 >> 244-360 - before lunch: 121-160 >> 156-220 >> 160-240 >> n/c >> 158, 200 >> n/c  >> 177-201 - after lunch:170s >> 190-200s >> 124-206, 270  >> 200s >> 163-227, 326 >>  n/c - before dinner:  179-267 >> 160s >> n/c >> 118-155 >> n/c >> n/c >> 111-328 - after dinner:  193-210 >> 250s >> 170-180 >> 149-186, 225 >> n/c >> 211-233 >> 99, 102 - bedtime: 200s >> 116-230 >> 155, 274 >> n/c >> 173, 215 >> n/c >> n/c Lowest sugar was 109 >> 59 x1, 79 x1; she has hypoglycemia awareness at 90.  Highest sugar was upper 300s >> 360.  Pt's meals are: - Breakfast: bowl of cereal (rice krispies, lucky charms) with milk 2% - Lunch: PB sandwich - Dinner: pork chop + rice/potatoes + green beans  - Snacks: no; smtms apples or bananas She is limited in what she can eat due to her gastroparesis.  - no CKD, last BUN/creatinine:  Lab Results  Component Value Date   BUN 13 06/05/2016   CREATININE 0.89 06/05/2016  Not an an ACEI. Last ACR 0.3 in 04/2012. - last set of lipids: Lab Results  Component Value Date   CHOL 212 (H) 06/24/2015   HDL 71.50 06/24/2015   LDLCALC 110 (H) 06/24/2015   LDLDIRECT 126.8 09/23/2012   TRIG 155.0 (H) 06/24/2015   CHOLHDL 3 06/24/2015  Not on a Statin. - last eye exam was in ~2009. No DR. Cannot afford eye exam , does  have blurry vision. - + numbness and tingling in her feet.  She had an endoscopy >> started on a new med for gastroparesis >> cannot remember name.  I reviewed pt's medications, allergies, PMH, social hx, family hx, and changes were documented in the history of present illness. Otherwise, unchanged from my initial visit note.  ROS: Constitutional: + weight gain, + fatigue, + subjective hyperthermia, + poor sleep, + dysuria Eyes: + blurry vision, no xerophthalmia ENT: + sore throat, no nodules palpated in throat, no dysphagia/no odynophagia, no hoarseness Cardiovascular: + CP/no SOB/no palpitations/+ leg swelling Respiratory: +  Cough/no SOB/+ wheezing Gastrointestinal: No N/+ V/no D/+ C/no heartburn Musculoskeletal: + both: muscle/joint aches Skin: + rash Neurological: no tremors/numbness/tingling/dizziness,+  HA + low  libido  PE: BP 138/88 (BP Location: Left Arm, Patient Position: Sitting)   Pulse 75   Wt 250 lb (113.4 kg)   SpO2 96%   BMI 40.35 kg/m  Body mass index is 40.35 kg/m. 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 DM2, insulin-dependent, uncontrolled, with complications - gastroparesis - 07/2010 - At 120 minutes, the amount of tracer remaining in the stomach ~39% (nl <30%). Seen by Dr Sharlett Iles - recommended to start Domperidone a that time >> could not afford.  - PN  PLAN:  1. Patient with long-standing, uncontrolled diabetes, on basal insulin and Glipizide. Sugars are still fluctuating, but we now see a pattern of lower sugars in am and higher sugars after b'fast and increasing even more towards dinnertime. She then drops her sugars after dinner and even had 2 mild lows at night. - After our last visit, I can see that she is more committed to better DM control, which is great - will increase her mealtime insulin with B and L and stay with the same insulin doses before dinner. - I also advised her to move the R insulin 30 min before meals as she is now taking this 15 min before - will also limit her SSI to only 5 units maximum - I suggested:  Patient Instructions  Please continue: - Glipizide XL 5 mg daily - Levemir 35 units at night   Change: - R insulin: - Mealtime insulin:  11-13 units before b'fast and lunch 7-9 units before dinner - Sliding scale - add to the above  doses: - 150-175: + 1 unit  - 176-200: + 2 units  - 201-225: + 3 units  - 226-250: + 4 units  - >250: + 5 units  Please inject R insulin 30 min before meals.  Please come back for a follow-up appointment in 3 months.  - continue checking her sugars at different times of the day - check 2-3 times a day, rotating checks  - she is due for yearly eye exams! (not covered now by her insurance) - Given flu shot at last visit - will check Lipids and also a BMP as she had hypokalemia inhouse - Return to clinic in 3 mo with sugar log   Office Visit on 07/09/2016  Component Date Value Ref Range Status  . Cholesterol 07/09/2016 210* 0 - 200 mg/dL Final  . Triglycerides 07/09/2016 128.0  0.0 - 149.0 mg/dL Final  . HDL 07/09/2016 69.40  >39.00 mg/dL Final  . VLDL 07/09/2016 25.6  0.0 - 40.0 mg/dL Final  . LDL Cholesterol 07/09/2016 115* 0 - 99 mg/dL Final  . Total CHOL/HDL Ratio 07/09/2016 3   Final  . NonHDL 07/09/2016 140.20   Final  . Sodium 07/09/2016 140  135 - 146 mmol/L Final  . Potassium 07/09/2016 4.1  3.5 - 5.3 mmol/L Final  . Chloride 07/09/2016 101  98 - 110 mmol/L Final  . CO2 07/09/2016 31  20 - 31 mmol/L Final  . Glucose, Bld 07/09/2016 153* 65 - 99 mg/dL Final  . BUN 07/09/2016 18  7 - 25 mg/dL Final  . Creat 07/09/2016 0.96  0.50 - 1.05 mg/dL Final   Comment:   For patients > or = 54 years of age: The upper reference limit for Creatinine is approximately 13% higher for people identified as African-American.     . Calcium 07/09/2016 9.4  8.6 - 10.4 mg/dL Final  . GFR, Est African American 07/09/2016 78  >=60 mL/min Final  . GFR, Est Non African American 07/09/2016 68  >=60 mL/min Final  . Fructosamine 07/09/2016 315* 190 - 270 umol/L Final   HbA1c calculated from the fructosamine is much better, is 6.5%.  LDL slightly high.  [name], MD PhD [facility]

## 2016-07-09 NOTE — Patient Instructions (Addendum)
Please continue: - Glipizide XL 5 mg daily - Levemir 35 units at night   Change: - R insulin: - Mealtime insulin:  11-13 units before b'fast and lunch 7-9 units before dinner - Sliding scale - add to the above doses: - 150-175: + 1 unit  - 176-200: + 2 units  - 201-225: + 3 units  - 226-250: + 4 units  - >250: + 5 units  Please inject R insulin 30 min before meals.  Please come back for a follow-up appointment in 3 months.

## 2016-07-13 LAB — FRUCTOSAMINE: FRUCTOSAMINE: 315 umol/L — AB (ref 190–270)

## 2016-07-14 ENCOUNTER — Telehealth: Payer: Self-pay

## 2016-07-14 NOTE — Telephone Encounter (Signed)
LVM, gave lab results. Gave call back number if any questions or concerns.  

## 2016-07-14 NOTE — Telephone Encounter (Signed)
-----   Message from [name], MD sent at 07/14/2016  7:54 AM EST ----- Almyra Free, can you please call pt:  HbA1c calculated from the fructosamine is much better, is 6.5%.  LDL (bad cholesterol) is slightly high.

## 2016-07-23 ENCOUNTER — Encounter: Payer: Self-pay | Admitting: Primary Care

## 2016-07-23 ENCOUNTER — Ambulatory Visit (INDEPENDENT_AMBULATORY_CARE_PROVIDER_SITE_OTHER): Payer: Managed Care, Other (non HMO) | Admitting: Primary Care

## 2016-07-23 VITALS — BP 124/72 | HR 88 | Temp 97.5°F | Ht 66.0 in | Wt 251.0 lb

## 2016-07-23 DIAGNOSIS — J069 Acute upper respiratory infection, unspecified: Secondary | ICD-10-CM

## 2016-07-23 DIAGNOSIS — K649 Unspecified hemorrhoids: Secondary | ICD-10-CM

## 2016-07-23 DIAGNOSIS — J45909 Unspecified asthma, uncomplicated: Secondary | ICD-10-CM

## 2016-07-23 MED ORDER — ALBUTEROL SULFATE HFA 108 (90 BASE) MCG/ACT IN AERS: 2.0000 | INHALATION_SPRAY | Freq: Four times a day (QID) | RESPIRATORY_TRACT | 1 refills | Status: DC | PRN

## 2016-07-23 MED ORDER — HYDROCORTISONE ACETATE 25 MG RE SUPP: 25.0000 mg | Freq: Two times a day (BID) | RECTAL | 0 refills | Status: DC

## 2016-07-23 MED ORDER — HYDROCODONE-HOMATROPINE 5-1.5 MG/5ML PO SYRP: 5.0000 mL | ORAL_SOLUTION | Freq: Every evening | ORAL | 0 refills | Status: DC | PRN

## 2016-07-23 NOTE — Progress Notes (Signed)
Pre visit review using our clinic review tool, if applicable. No additional management support is needed unless otherwise documented below in the visit note. 
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
## 2016-07-23 NOTE — Patient Instructions (Signed)
Shortness of Breath/Cough: Albuterol Inhaler: Inhale 2 puffs into the lungs every 6 to 8 hours as needed for wheezing and/or shortness of breath.   You may take the Hycodan cough suppressant at bedtime as needed for cough and rest. Caution this medication contains codeine and will make you feel drowsy.  Start taking Zyrtec at bedtime for runny nose, throat drainage, cough.  Please call me Monday next week if your symptoms get worse.  Ensure you are staying hydrated with water and rest.  It was a pleasure meeting you!   Upper Respiratory Infection, Adult Most upper respiratory infections (URIs) are a viral infection of the air passages leading to the lungs. A URI affects the nose, throat, and upper air passages. The most common type of URI is nasopharyngitis and is typically referred to as "the common cold." URIs run their course and usually go away on their own. Most of the time, a URI does not require medical attention, but sometimes a bacterial infection in the upper airways can follow a viral infection. This is called a secondary infection. Sinus and middle ear infections are common types of secondary upper respiratory infections. Bacterial pneumonia can also complicate a URI. A URI can worsen asthma and chronic obstructive pulmonary disease (COPD). Sometimes, these complications can require emergency medical care and may be life threatening. What are the causes? Almost all URIs are caused by viruses. A virus is a type of germ and can spread from one person to another. What increases the risk? You may be at risk for a URI if:  You smoke.  You have chronic heart or lung disease.  You have a weakened defense (immune) system.  You are very young or very old.  You have nasal allergies or asthma.  You work in crowded or poorly ventilated areas.  You work in health care facilities or schools. What are the signs or symptoms? Symptoms typically develop 2-3 days after you come in  contact with a cold virus. Most viral URIs last 7-10 days. However, viral URIs from the influenza virus (flu virus) can last 14-18 days and are typically more severe. Symptoms may include:  Runny or stuffy (congested) nose.  Sneezing.  Cough.  Sore throat.  Headache.  Fatigue.  Fever.  Loss of appetite.  Pain in your forehead, behind your eyes, and over your cheekbones (sinus pain).  Muscle aches. How is this diagnosed? Your health care provider may diagnose a URI by:  Physical exam.  Tests to check that your symptoms are not due to another condition such as:  Strep throat.  Sinusitis.  Pneumonia.  Asthma. How is this treated? A URI goes away on its own with time. It cannot be cured with medicines, but medicines may be prescribed or recommended to relieve symptoms. Medicines may help:  Reduce your fever.  Reduce your cough.  Relieve nasal congestion. Follow these instructions at home:  Take medicines only as directed by your health care provider.  Gargle warm saltwater or take cough drops to comfort your throat as directed by your health care provider.  Use a warm mist humidifier or inhale steam from a shower to increase air moisture. This may make it easier to breathe.  Drink enough fluid to keep your urine clear or pale yellow.  Eat soups and other clear broths and maintain good nutrition.  Rest as needed.  Return to work when your temperature has returned to normal or as your health care provider advises. You may need to stay  home longer to avoid infecting others. You can also use a face mask and careful hand washing to prevent spread of the virus.  Increase the usage of your inhaler if you have asthma.  Do not use any tobacco products, including cigarettes, chewing tobacco, or electronic cigarettes. If you need help quitting, ask your health care provider. How is this prevented? The best way to protect yourself from getting a cold is to practice good  hygiene.  Avoid oral or hand contact with people with cold symptoms.  Wash your hands often if contact occurs. There is no clear evidence that vitamin C, vitamin E, echinacea, or exercise reduces the chance of developing a cold. However, it is always recommended to get plenty of rest, exercise, and practice good nutrition. Contact a health care provider if:  You are getting worse rather than better.  Your symptoms are not controlled by medicine.  You have chills.  You have worsening shortness of breath.  You have brown or red mucus.  You have yellow or brown nasal discharge.  You have pain in your face, especially when you bend forward.  You have a fever.  You have swollen neck glands.  You have pain while swallowing.  You have white areas in the back of your throat. Get help right away if:  You have severe or persistent:  Headache.  Ear pain.  Sinus pain.  Chest pain.  You have chronic lung disease and any of the following:  Wheezing.  Prolonged cough.  Coughing up blood.  A change in your usual mucus.  You have a stiff neck.  You have changes in your:  Vision.  Hearing.  Thinking.  Mood. This information is not intended to replace advice given to you by your health care provider. Make sure you discuss any questions you have with your health care provider. Document Released: 11/11/2000 Document Revised: 01/19/2016 Document Reviewed: 08/23/2013 Elsevier Interactive Patient Education  2017 [employer].

## 2016-07-27 ENCOUNTER — Telehealth: Payer: Self-pay

## 2016-07-27 ENCOUNTER — Other Ambulatory Visit: Payer: Self-pay | Admitting: Primary Care

## 2016-07-27 DIAGNOSIS — J069 Acute upper respiratory infection, unspecified: Secondary | ICD-10-CM

## 2016-07-27 MED ORDER — AZITHROMYCIN 250 MG PO TABS: ORAL_TABLET | ORAL | 0 refills | Status: DC

## 2016-07-27 NOTE — Telephone Encounter (Signed)
Pt seen 07/23/16, now having prod cough with thick grayish phlegm,still wheezing and SOB; inhaler helps some;fever and chills. Pt request abx to [facility].pt request cb.

## 2016-07-27 NOTE — Telephone Encounter (Signed)
Noted. Please notify patient that Rx for Zpak sent to pharmacy. (Numerous antibiotic allergies noted.) Take 2 tablets by mouth today, then 1 tablet daily for 4 additional days.

## 2016-07-28 NOTE — Telephone Encounter (Signed)
Spoken and notified patient of Kate's comments. Patient verbalized understanding and will pick up Rx.

## 2016-07-28 NOTE — Telephone Encounter (Signed)
Message left for patient to return my call.  

## 2016-08-11 ENCOUNTER — Other Ambulatory Visit: Payer: Self-pay | Admitting: Internal Medicine

## 2016-08-19 ENCOUNTER — Other Ambulatory Visit: Payer: Self-pay | Admitting: Family Medicine

## 2016-08-19 NOTE — Telephone Encounter (Signed)
Pt hasn't seen you in over a year, she did have a recent acute appt with Anda Kraft, please advise

## 2016-08-20 NOTE — Telephone Encounter (Signed)
Please schedule f/u 3 mo and refill until then

## 2016-08-21 NOTE — Telephone Encounter (Signed)
Left voicemail requesting pt to call the office and schedule a f/u appt with Dr. Glori Bickers

## 2016-08-21 NOTE — Telephone Encounter (Signed)
Pt has appt scheduled for 06/11 at 345pm Pt aware  Thanks

## 2016-09-05 ENCOUNTER — Other Ambulatory Visit: Payer: Self-pay | Admitting: Family Medicine

## 2016-10-05 ENCOUNTER — Ambulatory Visit (INDEPENDENT_AMBULATORY_CARE_PROVIDER_SITE_OTHER): Payer: Managed Care, Other (non HMO) | Admitting: Internal Medicine

## 2016-10-05 ENCOUNTER — Encounter: Payer: Self-pay | Admitting: Internal Medicine

## 2016-10-05 VITALS — BP 120/74 | HR 82 | Wt 249.0 lb

## 2016-10-05 DIAGNOSIS — E1142 Type 2 diabetes mellitus with diabetic polyneuropathy: Secondary | ICD-10-CM

## 2016-10-05 LAB — POCT GLYCOSYLATED HEMOGLOBIN (HGB A1C): Hemoglobin A1C: 10

## 2016-10-05 MED ORDER — INSULIN REGULAR HUMAN 100 UNIT/ML IJ SOLN: 11.0000 [IU] | Freq: Three times a day (TID) | INTRAMUSCULAR | 11 refills | Status: DC

## 2016-10-05 MED ORDER — INSULIN DETEMIR 100 UNIT/ML ~~LOC~~ SOLN: 40.0000 [IU] | Freq: Every day | SUBCUTANEOUS | 11 refills | Status: DC

## 2016-10-05 NOTE — Progress Notes (Signed)
Patient ID: Alexa Woods, female   DOB: 08/26/1960, 56 y.o.   MRN: [id number]  HPI: Alexa Woods is a 56 y.o.-year-old female, returning for f/u for DM2 dx 1997, insulin-dependent since 2010, uncontrolled, with  complications (gastroparesis - dx 2012, PN). Last visit 3 mo ago.  Last hemoglobin A1c was: 07/09/2016: HbA1c calculated from the fructosamine is much better, 6.5%.  10/01/2015: HbA1c calculated from fructosamine is 8.9%. Lab Results  Component Value Date   HGBA1C 10.4 04/07/2016   HGBA1C 10.9 01/06/2016   HGBA1C 10.2 10/01/2015   She is on - Glipizide XL 5 mg daily - Levemir 35 units at night  - R insulin: - Mealtime insulin:  11-13 units before b'fast and lunch 7-9 units before dinner - Sliding scale - add to the above doses: - 150-175: + 1 unit  - 176-200: + 2 units  - 201-225: + 3 units  - 226-250: + 4 units  - >250: + 5 units Could not tolerate Metformin >> GI upset. (N+V) Tried Januvia >> nausea.  Pt checks her sugars 1-2x day and they are - forgot log - am:  140-160 >> 97, 124-197  >> 160s, 200s >> 109, 145- 164 >> 111-198, 231 >> 170, 200-230 - after b'fast: 180s >> 170-200s >> 164-207 >> n/c >> 151-219 >> 244-360 >> n/c - before lunch: 121-160 >> 156-220 >> 160-240 >> n/c >> 158, 200 >> n/c  >> 177-201 >> 220-240 - after lunch:170s >> 190-200s >> 124-206, 270  >> 200s >> 163-227, 326 >> n/c - before dinner:  179-267 >> 160s >> n/c >> 118-155 >> n/c >> n/c >> 111-328 >> n/c - after dinner: 250s >> 170-180 >> 149-186, 225 >> n/c >> 211-233 >> 99, 102 >> 200s - bedtime: 200s >> 116-230 >> 155, 274 >> n/c >> 173, 215 >> n/c >> n/c Lowest sugar was 109 >> 59 x1, 79 x1 >> 79 (before dinner) x1; she has hypoglycemia awareness at 90.  Highest sugar was upper 300s >> 360 >> upper 200s  Pt's meals are: - Breakfast: bowl of cereal (rice krispies, lucky charms) with milk 2% - Lunch: PB sandwich - Dinner: pork chop + rice/potatoes + green beans  - Snacks: no; smtms  apples or bananas She is limited in what she can eat due to her gastroparesis.  - no CKD, last BUN/creatinine:  Lab Results  Component Value Date   BUN 18 07/09/2016   CREATININE 0.96 07/09/2016  Not an an ACEI. Last ACR 0.3 in 04/2012. - last set of lipids: Lab Results  Component Value Date   CHOL 210 (H) 07/09/2016   HDL 69.40 07/09/2016   LDLCALC 115 (H) 07/09/2016   LDLDIRECT 126.8 09/23/2012   TRIG 128.0 07/09/2016   CHOLHDL 3 07/09/2016  Not on a Statin. - last eye exam was in ~2009. No DR. She could not afford eye exam until now >> now got eye insurance >> needs to schedule it - + numbness and tingling in her feet.  ROS: Constitutional: no weight gain/no weight loss, + fatigue, + subjective hyperthermia, no subjective hypothermia Eyes: no blurry vision, no xerophthalmia ENT: no sore throat, no nodules palpated in throat, no dysphagia, no odynophagia, no hoarseness Cardiovascular: no CP/no SOB/no palpitations/+ leg swelling Respiratory: + cough/no SOB/+ wheezing Gastrointestinal: + N/+ V/no D/+ C/+ acid reflux Musculoskeletal: no muscle aches/no joint aches Skin: + rashes, no hair loss Neurological: no tremors/no numbness/no tingling/no dizziness, + HA  I reviewed pt's medications, allergies, PMH,  social hx, family hx, and changes were documented in the history of present illness. Otherwise, unchanged from my initial visit note.  PE: BP 120/74 (BP Location: Left Arm, Patient Position: Sitting)   Pulse 82   Wt 249 lb (112.9 kg)   SpO2 94%   BMI 40.19 kg/m   Body mass index is 40.19 kg/m. 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 DM2, insulin-dependent, uncontrolled, with complications - gastroparesis - 07/2010 - At 120 minutes, the amount of tracer remaining in the stomach ~39% (nl <30%). Seen by Dr Sharlett Iles - recommended to start Domperidone a that time >> could not afford.  - PN  PLAN:  1. Patient with long-standing, uncontrolled diabetes, on basal insulin and Glipizide. Sugars are higher, most in the 200s >> will try to increase both insulin types. We have to be careful as she may develop lows at night. - I suggested:  Patient Instructions  Please increase: - Levemir to 40 units at night  (you may increase to 45 if sugars still high afterwards)  Please increase: - R insulin: - Mealtime insulin:  15-17 units before b'fast and lunch 11-13 units before dinner - Sliding scale: - 150-175: + 1 unit  - 176-200: + 2 units  - 201-225: + 3 units  - 226-250: + 4 units  - >250: + 5 units Please inject R insulin 30 min before meals.  Please stop Glipizide.  Please come back for a follow-up appointment in 3 months.  - today, HbA1c is 10%, but we need to check a  fructosamine - continue checking sugars at different times of the day - check 3x a day, rotating checks - advised for yearly eye exams >> she still needs one >> this is now covered  - advised for flu shot >> she is UTD - Return to clinic in 3 mo with sugar log   Office Visit on 10/05/2016  Component Date Value Ref Range Status  . Fructosamine 10/05/2016 422* 190 - 270 umol/L Final  . Hemoglobin A1C 10/05/2016 10.0   Final   HbA1c calculated from fructosamine: 8.78% (higher, but better than the one measured)  [name], MD PhD [facility] Endocrinology

## 2016-10-05 NOTE — Patient Instructions (Addendum)
Please increase: - Levemir to 40 units at night  (you may increase to 45 if sugars still high afterwards)  Please increase: - R insulin: - Mealtime insulin:  15-17 units before b'fast and lunch 11-13 units before dinner - Sliding scale: - 150-175: + 1 unit  - 176-200: + 2 units  - 201-225: + 3 units  - 226-250: + 4 units  - >250: + 5 units Please inject R insulin 30 min before meals.  Please stop Glipizide.  Please come back for a follow-up appointment in 3 months.

## 2016-10-07 ENCOUNTER — Telehealth: Payer: Self-pay | Admitting: Internal Medicine

## 2016-10-07 ENCOUNTER — Telehealth: Payer: Self-pay

## 2016-10-07 LAB — FRUCTOSAMINE: FRUCTOSAMINE: 422 umol/L — AB (ref 190–270)

## 2016-10-07 NOTE — Telephone Encounter (Signed)
-----   Message from [name], MD sent at 10/07/2016  1:40 PM EDT ----- Alexa Woods, can you please call pt: HbA1c calculated from fructosamine: 8.78% (higher, but better than the one measured)

## 2016-10-07 NOTE — Telephone Encounter (Signed)
LVM, gave lab results. Gave call back number if any questions or concerns.  

## 2016-10-07 NOTE — Telephone Encounter (Signed)
Pt was returning your phone call, requests call back.

## 2016-10-08 ENCOUNTER — Telehealth: Payer: Self-pay

## 2016-10-08 NOTE — Telephone Encounter (Signed)
Called patient back, advised to return phone call to discuss lab results. Left call back number

## 2016-10-08 NOTE — Telephone Encounter (Signed)
Called and LVM for patient to return phone call regarding lab work.

## 2016-11-09 ENCOUNTER — Encounter: Payer: Self-pay | Admitting: Family Medicine

## 2016-11-09 ENCOUNTER — Ambulatory Visit (INDEPENDENT_AMBULATORY_CARE_PROVIDER_SITE_OTHER): Payer: Managed Care, Other (non HMO) | Admitting: Family Medicine

## 2016-11-09 VITALS — BP 92/48 | HR 83 | Temp 98.6°F | Wt 250.0 lb

## 2016-11-09 DIAGNOSIS — E785 Hyperlipidemia, unspecified: Secondary | ICD-10-CM | POA: Diagnosis not present

## 2016-11-09 DIAGNOSIS — F418 Other specified anxiety disorders: Secondary | ICD-10-CM

## 2016-11-09 DIAGNOSIS — E1142 Type 2 diabetes mellitus with diabetic polyneuropathy: Secondary | ICD-10-CM

## 2016-11-09 DIAGNOSIS — I10 Essential (primary) hypertension: Secondary | ICD-10-CM | POA: Diagnosis not present

## 2016-11-09 NOTE — Assessment & Plan Note (Signed)
Last A1C calc from fructosamine 8.7 -seeing endocrinology  Reminded to make her eye exam Disc DM diet/not compliant currently - strategy for meals disc  Also disc exercise/she is limited -disc poss for chair exercise program
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
## 2016-11-09 NOTE — Assessment & Plan Note (Signed)
Discussed how this problem influences overall health and the risks it imposes  Reviewed plan for weight loss with lower calorie diet (via better food choices and also portion control or program like weight watchers) and exercise building up to or more than 30 minutes 5 days per week including some aerobic activity   Exercise is limited - disc poss of chair exercise program at home

## 2016-11-09 NOTE — Assessment & Plan Note (Signed)
bp in fair control at this time (lower than usual-no symptoms of hypotension)  BP Readings from Last 1 Encounters:  11/09/16 (!) 92/48   No changes needed Disc lifstyle change with low sodium diet and exercise  Labs today
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
## 2016-11-09 NOTE — Assessment & Plan Note (Signed)
Lipids today  Optimal goal for LDL is 70 or below  Would likely need a statin for this  Disc low sat/trans fat diet and handout given

## 2016-11-09 NOTE — Patient Instructions (Addendum)
Think about fixing meals separate from your husband so can eat healthier  Try to stick to a diabetic diet and continue seeing Dr Cruzita Lederer   If you have a computer- google chair exercise programs- there are quite a few of them and they may help   Talk to Dr Nelva Bush about options for pain control - your pain medication could be too sedating for you   Keep going to church - that support is good for you  Continue going to your psychiatrist   Labs today for cholesterol and chemistries and thyroid   Take care of yourself

## 2016-11-10 LAB — COMPREHENSIVE METABOLIC PANEL
ALT: 21 U/L (ref 0–35)
AST: 20 U/L (ref 0–37)
Albumin: 3.9 g/dL (ref 3.5–5.2)
Alkaline Phosphatase: 123 U/L — ABNORMAL HIGH (ref 39–117)
BILIRUBIN TOTAL: 0.3 mg/dL (ref 0.2–1.2)
BUN: 15 mg/dL (ref 6–23)
CALCIUM: 9.4 mg/dL (ref 8.4–10.5)
CHLORIDE: 99 meq/L (ref 96–112)
CO2: 36 mEq/L — ABNORMAL HIGH (ref 19–32)
Creatinine, Ser: 1.01 mg/dL (ref 0.40–1.20)
GFR: 60.79 mL/min (ref >60.00)
Glucose, Bld: 322 mg/dL — ABNORMAL HIGH (ref 70–99)
Potassium: 3.6 mEq/L (ref 3.5–5.1)
Sodium: 138 mEq/L (ref 135–145)
Total Protein: 7.4 g/dL (ref 6.0–8.3)

## 2016-11-10 LAB — LIPID PANEL
CHOL/HDL RATIO: 3
Cholesterol: 187 mg/dL (ref 0–200)
HDL: 66.5 mg/dL (ref >39.00)
LDL CALC: 98 mg/dL (ref 0–99)
NonHDL: 120.97
TRIGLYCERIDES: 116 mg/dL (ref 0.0–149.0)
VLDL: 23.2 mg/dL (ref 0.0–40.0)

## 2016-11-10 LAB — TSH: TSH: 5.78 u[IU]/mL — ABNORMAL HIGH (ref 0.35–4.50)

## 2016-11-11 ENCOUNTER — Other Ambulatory Visit (INDEPENDENT_AMBULATORY_CARE_PROVIDER_SITE_OTHER): Payer: Managed Care, Other (non HMO)

## 2016-11-11 DIAGNOSIS — R946 Abnormal results of thyroid function studies: Secondary | ICD-10-CM

## 2016-11-11 LAB — T4, FREE: Free T4: 0.67 ng/dL (ref 0.60–1.60)

## 2016-11-12 ENCOUNTER — Encounter: Payer: Self-pay | Admitting: *Deleted

## 2016-12-01 ENCOUNTER — Other Ambulatory Visit: Payer: Self-pay | Admitting: *Deleted

## 2016-12-01 MED ORDER — PANTOPRAZOLE SODIUM 40 MG PO TBEC
40.0000 mg | DELAYED_RELEASE_TABLET | Freq: Two times a day (BID) | ORAL | 3 refills | Status: DC
Start: 2016-12-01 — End: 2019-04-03

## 2016-12-15 ENCOUNTER — Other Ambulatory Visit: Payer: Self-pay | Admitting: Family Medicine

## 2016-12-22 ENCOUNTER — Telehealth: Payer: Self-pay

## 2016-12-22 ENCOUNTER — Other Ambulatory Visit: Payer: Self-pay | Admitting: Family Medicine

## 2016-12-22 NOTE — Telephone Encounter (Signed)
Patient called in states that she has problems with her sugars. She states that fasting they have been running in the 200's and she can not get them down. Patient states that at around 2:15 today she checked and it was 410. She has been doing her doses of Humulin, and Levemir. She just took a dose of the Humulin when she seen how high her sugars were just then. I advised that we would contact her if any changes to insulin.  Please advise.Thank you!

## 2016-12-22 NOTE — Telephone Encounter (Signed)
Please increase: - Levemir from 40 to 50 units at night   Please increase: - R insulin: - Mealtime insulin:  15-17 >> 20-24 units before b'fast and lunch 11-13 >> 14-18 units before dinner - Sliding scale: - 150-175: + 1 unit  - 176-200: + 2 units  - 201-225: + 3 units  - 226-250: + 4 units  - >250: + 5 units  Any ideas why they are so high: steroid shot? Any infection? Old insulin?

## 2016-12-23 ENCOUNTER — Telehealth: Payer: Self-pay

## 2016-12-23 NOTE — Telephone Encounter (Signed)
Attempted to contact patient to advise of the changes in medication. The number on file is not in service. Will attempt to try again.

## 2016-12-24 ENCOUNTER — Telehealth: Payer: Self-pay

## 2016-12-24 NOTE — Telephone Encounter (Signed)
Attempted to contact patient again, number not in service.

## 2016-12-24 NOTE — Telephone Encounter (Signed)
I wanted you to be aware, I have attempted to contact patient twice on these changes but the only number is on file is not in service.

## 2016-12-28 ENCOUNTER — Other Ambulatory Visit: Payer: Self-pay

## 2016-12-28 MED ORDER — INSULIN DETEMIR 100 UNIT/ML ~~LOC~~ SOLN: 50.0000 [IU] | Freq: Every day | SUBCUTANEOUS | 1 refills | Status: DC

## 2016-12-28 NOTE — Telephone Encounter (Signed)
Updated chart to correct prone number. We had the number transposed. xferred to [employer].

## 2017-01-21 ENCOUNTER — Ambulatory Visit (INDEPENDENT_AMBULATORY_CARE_PROVIDER_SITE_OTHER): Payer: Managed Care, Other (non HMO) | Admitting: Internal Medicine

## 2017-01-21 ENCOUNTER — Encounter: Payer: Self-pay | Admitting: Internal Medicine

## 2017-01-21 VITALS — BP 110/62 | HR 91 | Wt 252.0 lb

## 2017-01-21 DIAGNOSIS — Z6841 Body Mass Index (BMI) 40.0 and over, adult: Secondary | ICD-10-CM | POA: Diagnosis not present

## 2017-01-21 DIAGNOSIS — IMO0001 Reserved for inherently not codable concepts without codable children: Secondary | ICD-10-CM | POA: Insufficient documentation

## 2017-01-21 DIAGNOSIS — E669 Obesity, unspecified: Secondary | ICD-10-CM

## 2017-01-21 DIAGNOSIS — E1142 Type 2 diabetes mellitus with diabetic polyneuropathy: Secondary | ICD-10-CM | POA: Diagnosis not present

## 2017-01-21 LAB — POCT GLYCOSYLATED HEMOGLOBIN (HGB A1C): Hemoglobin A1C: 10.3

## 2017-01-21 MED ORDER — INSULIN REGULAR HUMAN 100 UNIT/ML IJ SOLN: 14.0000 [IU] | Freq: Three times a day (TID) | INTRAMUSCULAR | 11 refills | Status: DC

## 2017-01-21 MED ORDER — EMPAGLIFLOZIN 25 MG PO TABS: 25.0000 mg | ORAL_TABLET | Freq: Every day | ORAL | 11 refills | Status: DC

## 2017-01-21 NOTE — Patient Instructions (Addendum)
Please continue: - Levemir 50 units at night  - R insulin: - Mealtime insulin:  20-24 units before b'fast and lunch 14-18 units before dinner - Sliding scale: - 150-175: + 1 unit  - 176-200: + 2 units  - 201-225: + 3 units  - 226-250: + 4 units  - >250: + 5 units  Please add: - Jardiance 25 mg before b'fast  If you can start Jardiance, take only 1/2 of the HCTZ tablet (12.5 mg).  Please return in 3 months with your sugar log.
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
## 2017-02-04 ENCOUNTER — Telehealth: Payer: Self-pay | Admitting: Internal Medicine

## 2017-02-04 NOTE — Telephone Encounter (Signed)
Patient is uncomfortable taking the empagliflozin (JARDIANCE) 25 MG TABS tablet. Call patient to further discuss.

## 2017-02-04 NOTE — Telephone Encounter (Signed)
Routing to you °

## 2017-02-08 ENCOUNTER — Telehealth: Payer: Self-pay

## 2017-02-08 NOTE — Telephone Encounter (Signed)
Called patient to discuss the issues of Jardience. Left call back number to call back to discuss.

## 2017-02-08 NOTE — Telephone Encounter (Signed)
I would at least try it. We do not have many options left, unfortunately.

## 2017-02-08 NOTE — Telephone Encounter (Signed)
Patient called back regarding the Jardiance issue.   Patient has stomach issues already, and the other medication and is scared to take anything else with it; and is not sure what else to do.     She will try it if you say its okay.

## 2017-02-09 ENCOUNTER — Telehealth: Payer: Self-pay

## 2017-02-09 NOTE — Telephone Encounter (Signed)
Called and notified patient of MD message to try the medication. Patient understood, had no questions.

## 2017-03-10 ENCOUNTER — Ambulatory Visit (INDEPENDENT_AMBULATORY_CARE_PROVIDER_SITE_OTHER): Payer: Managed Care, Other (non HMO) | Admitting: Family Medicine

## 2017-03-10 ENCOUNTER — Encounter: Payer: Self-pay | Admitting: Family Medicine

## 2017-03-10 VITALS — BP 92/60 | HR 73 | Temp 98.6°F | Ht 66.0 in | Wt 254.2 lb

## 2017-03-10 DIAGNOSIS — B373 Candidiasis of vulva and vagina: Secondary | ICD-10-CM

## 2017-03-10 DIAGNOSIS — B3731 Acute candidiasis of vulva and vagina: Secondary | ICD-10-CM

## 2017-03-10 DIAGNOSIS — R350 Frequency of micturition: Secondary | ICD-10-CM | POA: Diagnosis not present

## 2017-03-10 DIAGNOSIS — R300 Dysuria: Secondary | ICD-10-CM | POA: Diagnosis not present

## 2017-03-10 LAB — POC URINALSYSI DIPSTICK (AUTOMATED)
Bilirubin, UA: NEGATIVE
Glucose, UA: 250
Ketones, UA: NEGATIVE
Leukocytes, UA: NEGATIVE
NITRITE UA: NEGATIVE
PH UA: 6 (ref 5.0–8.0)
PROTEIN UA: NEGATIVE
RBC UA: NEGATIVE
SPEC GRAV UA: 1.025 (ref 1.010–1.025)
Urobilinogen, UA: 0.2 E.U./dL

## 2017-03-10 LAB — POCT WET PREP (WET MOUNT)
KOH WET PREP POC: POSITIVE
TRICHOMONAS WET PREP HPF POC: ABSENT

## 2017-03-10 MED ORDER — FLUCONAZOLE 150 MG PO TABS: 150.0000 mg | ORAL_TABLET | Freq: Once | ORAL | 0 refills | Status: AC

## 2017-03-10 NOTE — Progress Notes (Signed)
Subjective:    Patient ID: Alexa Woods, female    DOB: 11/14/1960, 56 y.o.   MRN: [id number]  HPI Here for urinary symptoms - about a week ago  Tends to have chronic urinary symptoms anyway   Burning to urinate  Frequency/urgency  Needs to pee all the time  Feels some spasm in L lower abdomen  Bladder is uncomfortable   No blood in urine but sometimes it is darker  Forcing herself to drink a lot more water   Vaginal d/c is her usual  Some itching at times  No vaginal burning  No sexual activity in 12 years   Blood glucose has been out of control  Last night it dropped to 54 (unusual) -on insulin (took too much w/o checking glucose)   Wt Readings from Last 3 Encounters:  03/10/17 254 lb 4 oz (115.3 kg)  01/21/17 252 lb (114.3 kg)  11/09/16 250 lb (113.4 kg)   Results for orders placed or performed in visit on 03/10/17  POCT Urinalysis Dipstick (Automated)  Result Value Ref Range   Color, UA yellow    Clarity, UA clear    Glucose, UA 250 mg/dl    Bilirubin, UA negative    Ketones, UA negative    Spec Grav, UA 1.025 1.010 - 1.025   Blood, UA negative    pH, UA 6.0 5.0 - 8.0   Protein, UA negative    Urobilinogen, UA 0.2 0.2 or 1.0 E.U./dL   Nitrite, UA negative    Leukocytes, UA Negative Negative  POCT Wet Prep ([place])  Result Value Ref Range   Source Wet Prep POC vag    WBC, Wet Prep HPF POC few    Bacteria Wet Prep HPF POC Few Few   BACTERIA WET PREP MORPHOLOGY POC     Clue Cells Wet Prep HPF POC None None   Clue Cells Wet Prep Whiff POC     Yeast Wet Prep HPF POC Many    KOH Wet Prep POC pos    Trichomonas Wet Prep HPF POC Absent Absent      Patient Active Problem List   Diagnosis Date Noted  . Yeast vaginitis 03/10/2017  . Class 3 obesity with serious comorbidity and body mass index (BMI) of 40.0 to 44.9 in adult 01/21/2017  . Dysuria 08/02/2015  . LUQ abdominal pain 07/19/2014  . Joint swelling 12/20/2013  . Leg pain, bilateral 11/28/2013   . Periodontal disease 02/03/2013  . Hyperlipidemia, mild 09/27/2012  . Gastritis and gastroduodenitis 08/02/2012  . Rash of hands 05/23/2012  . 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 Lung density on x-ray 02/23/2011  . DYSPHAGIA UNSPECIFIED 08/07/2010  . Low back pain 07/22/2010  . Morbid obesity ([place]) 03/30/2008  . 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  . Type 2 diabetes mellitus with peripheral neuropathy ([place]) 11/30/2006  . ANXIETY 11/30/2006  . History of alcohol abuse 11/30/2006  . Depression with anxiety 11/30/2006  . Essential hypertension 11/30/2006  . HEMORRHOIDS 11/30/2006  . ALLERGIC RHINITIS 11/30/2006  . ASTHMA 11/30/2006  . GERD 11/30/2006  . CYSTITIS, CHRONIC 11/30/2006  . PSORIASIS 11/30/2006   Past Medical History:  Diagnosis Date  . Alcohol abuse, unspecified   . Allergic rhinitis, cause unspecified   . Anxiety state, unspecified   . Backache, unspecified   . Depressive disorder, not elsewhere classified   . Dysphagia, unspecified(787.20)   . Edema   . Epigastric abdominal pain   . Esophageal reflux   .  Gastroparesis   . GERD (gastroesophageal reflux disease)   . Herpes zoster without mention of complication   . History of Bell's palsy 4/09  . Hypopotassemia   . IBS (irritable bowel syndrome)   . Morbid obesity ([place])   . Myocardial infarction ([place])   . Other chronic cystitis   . Other psoriasis   . Other screening mammogram   . Pain in joint, lower leg   . Type II or unspecified type diabetes mellitus without mention of complication, not stated as uncontrolled   . Unspecified asthma(493.90)   . Unspecified essential hypertension   . Unspecified hemorrhoids without mention of complication   . Wrist fracture    Past Surgical History:  Procedure Laterality Date  . APPENDECTOMY    . CHOLECYSTECTOMY    . DOBUTAMINE STRESS ECHO  2009    normal stress echo, no evidence of ischemia  . ELBOW SURGERY Right    for nerve damage  .  ESOPHAGOGASTRODUODENOSCOPY  07/2002   erythematous gastropathy  . KNEE ARTHROSCOPY W/ PARTIAL MEDIAL MENISCECTOMY Right 10/2008   and chondroplasty  . MRI  11/2102   pt states she has a bone spur on her spine  . PARTIAL HYSTERECTOMY    . SHOULDER SURGERY Left 05/2010   Dr. Alphonzo Severance  . TOTAL KNEE ARTHROPLASTY  2011   Dr. Ricki Rodriguez   Social History  Substance Use Topics  . Smoking status: Former Smoker    Quit date: 12/30/2001  . Smokeless tobacco: Never Used  . Alcohol use No     Comment: former alcoholic    Family History  Problem Relation Age of Onset  . Alcohol abuse Mother   . Hypertension Mother   . Esophageal cancer Maternal Grandmother   . Lung cancer Unknown        mat great uncle  . Colon cancer Neg Hx    Allergies  Allergen Reactions  . Bupropion Hcl   . Cefuroxime Axetil Nausea Only  . Codeine     REACTION: nausea and vomiting, rash  . Lidocaine     REACTION: unknown  . Metformin     REACTION: GI  . Paroxetine     REACTION: doesn't agree  . Propoxyphene N-Acetaminophen     REACTION: wheezing  . Sulfonamide Derivatives     REACTION: rash  . Tramadol Hcl     REACTION: Causes Anxiety   Current Outpatient Prescriptions on File Prior to Visit  Medication Sig Dispense Refill  . albuterol (PROVENTIL HFA;VENTOLIN HFA) 108 (90 Base) MCG/ACT inhaler Inhale 2 puffs into the lungs every 6 (six) hours as needed for wheezing or shortness of breath. 18 g 1  . dicyclomine (BENTYL) 10 MG capsule Take 1 capsule (10 mg total) by mouth 3 (three) times daily as needed for spasms. 90 capsule 1  . GLOBAL INJECT EASE INSULIN SYR 31G X 5/16" 0.5 ML MISC     . glucose blood (ONE TOUCH ULTRA TEST) test strip Use to test blood sugar 4 times daily as instructed. 125 each 2  . hydrochlorothiazide (HYDRODIURIL) 25 MG tablet TAKE 1 TABLET BY MOUTH DAILY 30 tablet 5  . hydrocortisone (ANUSOL-HC) 25 MG suppository Place 1 suppository (25 mg total) rectally 2 (two) times daily. 12 suppository  0  . insulin detemir (LEVEMIR) 100 UNIT/ML injection Inject 0.5 mLs (50 Units total) into the skin daily. 30 mL 1  . Insulin Pen Needle (LEADER UNIFINE PENTIPS PLUS) 31G X 5 MM MISC Use twice daily as instructed. 100  each 2  . insulin regular (HUMULIN R) 100 units/mL injection Inject 0.14-0.24 mLs (14-24 Units total) into the skin 3 (three) times daily before meals. 20 mL 11  . Insulin Syringe-Needle U-100 (B-D INSULIN SYRINGE) 31G X 5/16" 0.3 ML MISC Use 3x a day 200 each 3  . Lancets (ONETOUCH ULTRASOFT) lancets Use to test blood sugar 4 times daily as instructed. 125 each 2  . LORazepam (ATIVAN) 1 MG tablet Take 1 mg by mouth every 8 (eight) hours as needed for anxiety or sleep.     Marland Kitchen ondansetron (ZOFRAN ODT) 4 MG disintegrating tablet Take 1 tablet (4 mg total) by mouth every 6 (six) hours as needed for nausea or vomiting. 20 tablet 0  . oxyCODONE-acetaminophen (PERCOCET) 10-325 MG tablet     . pantoprazole (PROTONIX) 40 MG tablet Take 1 tablet (40 mg total) by mouth 2 (two) times daily. 180 tablet 3  . perphenazine (TRILAFON) 2 MG tablet Take 2 mg by mouth at bedtime.    . potassium chloride (K-DUR,KLOR-CON) 10 MEQ tablet TAKE 1 TABLET BY MOUTH DAILY 30 tablet 5  . sertraline (ZOLOFT) 100 MG tablet Take 200 mg by mouth daily.     Marland Kitchen tiZANidine (ZANAFLEX) 4 MG tablet Take 4 mg by mouth 2 (two) times daily.     Marland Kitchen zolpidem (AMBIEN) 10 MG tablet Take 15 mg by mouth at bedtime as needed for sleep.     . empagliflozin (JARDIANCE) 25 MG TABS tablet Take 25 mg by mouth daily. (Patient not taking: Reported on 03/10/2017) 30 tablet 11  . [DISCONTINUED] potassium chloride (KLOR-CON) 10 MEQ CR tablet Take 1 tablet (10 mEq total) by mouth daily. 60 tablet 6   No current facility-administered medications on file prior to visit.     Review of Systems  Constitutional: Positive for fatigue. Negative for activity change, appetite change and fever.  HENT: Negative for congestion and sore throat.   Eyes:  Negative for itching and visual disturbance.  Respiratory: Negative for cough and shortness of breath.   Cardiovascular: Negative for leg swelling.  Gastrointestinal: Negative for abdominal distention, abdominal pain, constipation, diarrhea and nausea.  Endocrine: Negative for cold intolerance and polydipsia.  Genitourinary: Positive for dysuria, frequency, urgency and vaginal discharge. Negative for difficulty urinating, flank pain and hematuria.  Musculoskeletal: Negative for myalgias.  Skin: Negative for rash.  Allergic/Immunologic: Negative for immunocompromised state.  Neurological: Negative for dizziness and weakness.  Hematological: Negative for adenopathy.       Objective:   Physical Exam  Constitutional: She appears well-developed and well-nourished. No distress.  obese and well appearing   HENT:  Head: Normocephalic and atraumatic.  Eyes: Pupils are equal, round, and reactive to light. Conjunctivae and EOM are normal.  Neck: Normal range of motion. Neck supple.  Cardiovascular: Normal rate, regular rhythm and normal heart sounds.   Pulmonary/Chest: Effort normal and breath sounds normal.  Abdominal: Soft. Bowel sounds are normal. She exhibits no distension. There is tenderness. There is no rebound.  No cva tenderness  Mild suprapubic tenderness  Genitourinary:  Genitourinary Comments: No external genitalia  Scant cloudy d/c with odor  No excoriation or lesion Wet prep obt  Musculoskeletal: She exhibits no edema.  Lymphadenopathy:    She has no cervical adenopathy.  Neurological: She is alert.  Skin: No rash noted. No pallor.  Psychiatric: She has a normal mood and affect.          Assessment & Plan:   Problem List Items Addressed This  Visit      Genitourinary   Yeast vaginitis    This may add to her urinary symptoms  tx with diflucan 150 times one Update if not starting to improve in a week or if worsening   High blood glucose levels may make this hard  to resolve      Relevant Orders   POCT Wet Prep [facility]) (Completed)     Other   Dysuria - Primary    Clear ua except for glucose  Sent for cx  Enc water intake and better glucose control (sees endo)  Also tx vaginal yeast infection   Symptoms are somewhat chronic as well       Relevant Orders   Urine Culture    Other Visit Diagnoses    Urinary frequency       Relevant Orders   POCT Urinalysis Dipstick (Automated) (Completed)

## 2017-03-10 NOTE — Patient Instructions (Signed)
Urinalysis is normal (except for glucose) Drink lots of water  We will get a urine culture and alert you with a result   For yeast vaginal infection take the diflucan once as directed   Update if not starting to improve in a week or if worsening

## 2017-03-11 NOTE — Assessment & Plan Note (Signed)
Clear ua except for glucose  Sent for cx  Enc water intake and better glucose control (sees endo)  Also tx vaginal yeast infection   Symptoms are somewhat chronic as well

## 2017-03-11 NOTE — Assessment & Plan Note (Signed)
This may add to her urinary symptoms  tx with diflucan 150 times one Update if not starting to improve in a week or if worsening   High blood glucose levels may make this hard to resolve

## 2017-03-14 LAB — URINE CULTURE
MICRO NUMBER:: 81129465
SPECIMEN QUALITY:: ADEQUATE

## 2017-03-25 ENCOUNTER — Ambulatory Visit: Payer: Self-pay

## 2017-03-25 NOTE — Telephone Encounter (Signed)
   Reason for Disposition . Side (flank) or lower back pain present  Answer Assessment - Initial Assessment Questions 1. SYMPTOM: "What's the main symptom you're concerned about?" (e.g., frequency, incontinence)     Continued burning and bladder pain 2. ONSET: "When did the  ________  start?"     May have eased up a few days after starting the abx but not really went away 3. PAIN: "Is there any pain?" If so, ask: "How bad is it?" (Scale: 1-10; mild, moderate, severe)     Yes 6/10 4. CAUSE: "What do you think is causing the symptoms?"     UTI 5. OTHER SYMPTOMS: "Do you have any other symptoms?" (e.g., fever, flank pain, blood in urine, pain with urination)     No fever, faint flank pain, pain with urination 6. PREGNANCY: "Is there any chance you are pregnant?" "When was your last menstrual period?"     N/a post menopausal  Protocols used: URINARY [facility]

## 2017-03-25 NOTE — Telephone Encounter (Signed)
This encounter was created in error - please disregard.

## 2017-03-26 ENCOUNTER — Encounter: Payer: Self-pay | Admitting: Family Medicine

## 2017-03-26 ENCOUNTER — Ambulatory Visit (INDEPENDENT_AMBULATORY_CARE_PROVIDER_SITE_OTHER): Payer: Managed Care, Other (non HMO) | Admitting: Family Medicine

## 2017-03-26 VITALS — BP 122/68 | HR 98 | Temp 98.7°F | Ht 66.0 in | Wt 253.8 lb

## 2017-03-26 DIAGNOSIS — R300 Dysuria: Secondary | ICD-10-CM | POA: Diagnosis not present

## 2017-03-26 DIAGNOSIS — J45909 Unspecified asthma, uncomplicated: Secondary | ICD-10-CM | POA: Diagnosis not present

## 2017-03-26 LAB — POC URINALSYSI DIPSTICK (AUTOMATED)
BILIRUBIN UA: NEGATIVE
Blood, UA: NEGATIVE
Glucose, UA: NEGATIVE
Ketones, UA: NEGATIVE
LEUKOCYTES UA: NEGATIVE
NITRITE UA: NEGATIVE
PH UA: 6 (ref 5.0–8.0)
PROTEIN UA: NEGATIVE
Spec Grav, UA: 1.02 (ref 1.010–1.025)
Urobilinogen, UA: 0.2 E.U./dL

## 2017-03-26 MED ORDER — ALBUTEROL SULFATE HFA 108 (90 BASE) MCG/ACT IN AERS: 2.0000 | INHALATION_SPRAY | Freq: Four times a day (QID) | RESPIRATORY_TRACT | 1 refills | Status: DC | PRN

## 2017-03-26 MED ORDER — CIPROFLOXACIN HCL 250 MG PO TABS: 250.0000 mg | ORAL_TABLET | Freq: Two times a day (BID) | ORAL | 0 refills | Status: DC

## 2017-03-26 NOTE — Addendum Note (Signed)
Addended by: Carter Kitten on: 03/26/2017 11:01 AM   Modules accepted: Orders

## 2017-03-26 NOTE — Assessment & Plan Note (Signed)
UA clear but was clear 2 weeks ago when enteroccocus found. Will broaden antibiotics to Cipro and repeat 7 days while awaiting Urine culture.  Increase fluids.  Get better DM control.

## 2017-03-26 NOTE — Progress Notes (Signed)
   Subjective:    Patient ID: Alexa Woods, female    DOB: 1960-12-18, 56 y.o.   MRN: [id number]  Dysuria   This is a new problem. The current episode started 1 to 4 weeks ago. The problem occurs every urination. The problem has been unchanged (no change in urination). The quality of the pain is described as burning. The pain is moderate. There has been no fever. Associated symptoms include frequency, hesitancy and urgency. Pertinent negatives include no discharge, flank pain, hematuria, nausea or vomiting. Associated symptoms comments: Lower central abdominal pain, constant  goes to bathroom and has urge to urinate but then minimal urine comes.. She has tried antibiotics and increased fluids for the symptoms. The treatment provided no relief. Her past medical history is significant for recurrent UTIs. There is no history of catheterization, kidney stones or a urological procedure.  Abdominal Pain  Associated symptoms include dysuria and frequency. Pertinent negatives include no hematuria, nausea or vomiting.   Recently treated with macrobid 100 mg BID on 10/14.Marland Kitchen Urine culture showed enterococcus  No improvement in symptoms. HX of chronic cystitis, DM type 2  Poorly controlled ( last a1c 10)   Review of Systems  Gastrointestinal: Positive for abdominal pain. Negative for nausea and vomiting.  Genitourinary: Positive for dysuria, frequency, hesitancy and urgency. Negative for flank pain and hematuria.       Objective:   Physical Exam  Constitutional: Vital signs are normal. She appears well-developed and well-nourished. She is cooperative.  Non-toxic appearance. She does not appear ill. No distress.  obese  HENT:  Head: Normocephalic.  Right Ear: Hearing, tympanic membrane, external ear and ear canal normal. Tympanic membrane is not erythematous, not retracted and not bulging.  Left Ear: Hearing, tympanic membrane, external ear and ear canal normal. Tympanic membrane is not erythematous,  not retracted and not bulging.  Nose: No mucosal edema or rhinorrhea. Right sinus exhibits no maxillary sinus tenderness and no frontal sinus tenderness. Left sinus exhibits no maxillary sinus tenderness and no frontal sinus tenderness.  Mouth/Throat: Uvula is midline, oropharynx is clear and moist and mucous membranes are normal.  Eyes: Pupils are equal, round, and reactive to light. Conjunctivae, EOM and lids are normal. Lids are everted and swept, no foreign bodies found.  Neck: Trachea normal and normal range of motion. Neck supple. Carotid bruit is not present. No thyroid mass and no thyromegaly present.  Cardiovascular: Normal rate, regular rhythm, S1 normal, S2 normal, normal heart sounds, intact distal pulses and normal pulses.  Exam reveals no gallop and no friction rub.   No murmur heard. Pulmonary/Chest: Effort normal and breath sounds normal. No tachypnea. No respiratory distress. She has no decreased breath sounds. She has no wheezes. She has no rhonchi. She has no rales.  Abdominal: Soft. Normal appearance and bowel sounds are normal. There is no hepatosplenomegaly. There is tenderness in the suprapubic area. There is CVA tenderness. There is no rigidity, no rebound and no guarding.  Slight left flank/CVA pain.. But may be related to  Hx of back problems chronically  Neurological: She is alert.  Skin: Skin is warm, dry and intact. No rash noted.  Psychiatric: Her speech is normal and behavior is normal. Judgment and thought content normal. Her mood appears not anxious. Cognition and memory are normal. She does not exhibit a depressed mood.          Assessment & Plan:

## 2017-03-26 NOTE — Patient Instructions (Addendum)
Increase water.  Start course of antibiotics.  We will call with culture results.  Can use AZO for burning.  If not improving or fever on antibiotics or increase abdominal pain over weekend .Marland Kitchen Go to ER.  Hold tizanadine  Or use half dose while on antibiotics.

## 2017-03-27 LAB — URINE CULTURE
MICRO NUMBER:: 81202750
SPECIMEN QUALITY:: ADEQUATE

## 2017-04-05 ENCOUNTER — Encounter: Payer: Self-pay | Admitting: Family Medicine

## 2017-04-05 ENCOUNTER — Ambulatory Visit (INDEPENDENT_AMBULATORY_CARE_PROVIDER_SITE_OTHER): Payer: Managed Care, Other (non HMO) | Admitting: Family Medicine

## 2017-04-05 VITALS — BP 128/84 | HR 81 | Temp 98.7°F | Ht 66.0 in | Wt 252.2 lb

## 2017-04-05 DIAGNOSIS — Z01818 Encounter for other preprocedural examination: Secondary | ICD-10-CM | POA: Diagnosis not present

## 2017-04-05 DIAGNOSIS — I10 Essential (primary) hypertension: Secondary | ICD-10-CM

## 2017-04-05 DIAGNOSIS — Z01810 Encounter for preprocedural cardiovascular examination: Secondary | ICD-10-CM

## 2017-04-05 DIAGNOSIS — Z23 Encounter for immunization: Secondary | ICD-10-CM | POA: Diagnosis not present

## 2017-04-05 DIAGNOSIS — E1142 Type 2 diabetes mellitus with diabetic polyneuropathy: Secondary | ICD-10-CM

## 2017-04-05 DIAGNOSIS — J4520 Mild intermittent asthma, uncomplicated: Secondary | ICD-10-CM

## 2017-04-05 NOTE — Assessment & Plan Note (Signed)
Pt uses albuterol prn and generally needs tx when she gets sick She does mention issues with hosp and anesthesia in the past  Since she is here for preop clearance-will ref to pulmonary to discuss this

## 2017-04-05 NOTE — Assessment & Plan Note (Signed)
Per pt-recently improved  Urged her to check in with her endocrinologist before knee surgery

## 2017-04-05 NOTE — Assessment & Plan Note (Signed)
This inc risks mildly for upcoming surgery  She has done well with prev ortho surgeries

## 2017-04-05 NOTE — Assessment & Plan Note (Signed)
bp in fair control at this time  BP Readings from Last 1 Encounters:  04/05/17 128/84   No changes needed Disc lifstyle change with low sodium diet and exercise

## 2017-04-05 NOTE — Assessment & Plan Note (Signed)
Rev hx and medications and med intolerances In light of hx of ? Anesthesia reaction /asthma exacerbation during surgery-ref to pulmonary to help clear for upcoming knee surgery No blood thinners  Morbid obesity noted as well as former smoking  Reassuring vitals  EKG is stable with low voltage that is baseline for her  Will send note to Dr Maureen Ralphs

## 2017-04-05 NOTE — Progress Notes (Signed)
Subjective:    Patient ID: Alexa Woods, female    DOB: 26-Dec-1960, 56 y.o.   MRN: [id number]  HPI Here for pre operative exam for upcoming surgery for Dr Maureen Ralphs  Has arthroscopy planned L knee - for meniscus tear Thinks she also has arthritis   Nov 20th   Hx of anesthesia - pt states her "breathing gets bad"and she tends to need oxygen -- thinks she gets some mucous in airways when intubated  Plans on general anesthesia  Has albuterol inhaler  No preventative inhaler  She has her anesthesia interview ? Before the surgery  Has never had to be cleared by pulmonary in the past  This is supposed to be outpatient procedure     Wt Readings from Last 3 Encounters:  04/05/17 252 lb 4 oz (114.4 kg)  03/26/17 253 lb 12 oz (115.1 kg)  03/10/17 254 lb 4 oz (115.3 kg)  stable/ morbid obesity  40.71 kg/m   She has had numerous surgeries in the past including TKA with Dr Maureen Ralphs in the past  Last surgery was 2016 -no respiratory problems   Had her flu shot today  bp is stable today  No cp or palpitations or headaches or edema  No side effects to medicines  BP Readings from Last 3 Encounters:  04/05/17 128/84  03/26/17 122/68  03/10/17 92/60      DM2 She sees endocrinology  On Humulin and levemir Last A1C was around 10.3 (?)-now is much improved - has had a few lows and nothing over 200 since  Sees endo after surgery   Has to do a "pre admit" on the computer- has not been able to do that yet   Lab Results  Component Value Date   CREATININE 1.01 11/09/2016   BUN 15 11/09/2016   NA 138 11/09/2016   K 3.6 11/09/2016   CL 99 11/09/2016   CO2 36 (H) 11/09/2016   Lab Results  Component Value Date   ALT 21 11/09/2016   AST 20 11/09/2016   ALKPHOS 123 (H) 11/09/2016   BILITOT 0.3 11/09/2016    Drug sensitivities  Codeine- n/v and rash  Lidocaine-unknonw Metformin GI Propoxyphene -acet  Wheezing  Sulfa -rash Tramadol-makes her anx   She had a stress echo 2009    No cardiac hx    EKG today NSR with rate of 81 Low voltage noted -this is stable from previous EKGs  Pulse Readings from Last 3 Encounters:  04/05/17 81  03/26/17 98  03/10/17 73   Pulse ox is 94%   Quit smoking in 2003   Patient Active Problem List   Diagnosis Date Noted  . Pre-operative exam 04/05/2017  . Class 3 obesity with serious comorbidity and body mass index (BMI) of 40.0 to 44.9 in adult 01/21/2017  . Dysuria 08/02/2015  . LUQ abdominal pain 07/19/2014  . Joint swelling 12/20/2013  . Leg pain, bilateral 11/28/2013  . Periodontal disease 02/03/2013  . Hyperlipidemia, mild 09/27/2012  . Gastritis and gastroduodenitis 08/02/2012  . Rash of hands 05/23/2012  . 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 DYSPHAGIA UNSPECIFIED 08/07/2010  . Low back pain 07/22/2010  . Morbid obesity ([place]) 03/30/2008  . 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  . Type 2 diabetes mellitus with peripheral neuropathy ([place]) 11/30/2006  . ANXIETY 11/30/2006  . History of alcohol abuse 11/30/2006  . Depression with anxiety 11/30/2006  . Essential hypertension 11/30/2006  . HEMORRHOIDS 11/30/2006  . ALLERGIC RHINITIS 11/30/2006  . Asthma  11/30/2006  . GERD 11/30/2006  . CYSTITIS, CHRONIC 11/30/2006  . PSORIASIS 11/30/2006   Past Medical History:  Diagnosis Date  . Alcohol abuse, unspecified   . Allergic rhinitis, cause unspecified   . Anxiety state, unspecified   . Backache, unspecified   . Depressive disorder, not elsewhere classified   . Dysphagia, unspecified(787.20)   . Edema   . Epigastric abdominal pain   . Esophageal reflux   . Gastroparesis   . GERD (gastroesophageal reflux disease)   . Herpes zoster without mention of complication   . History of Bell's palsy 4/09  . Hypopotassemia   . IBS (irritable bowel syndrome)   . Morbid obesity ([place])   . Myocardial infarction ([place])   . Other chronic cystitis   . Other psoriasis   . Other screening mammogram   . Pain in  joint, lower leg   . Type II or unspecified type diabetes mellitus without mention of complication, not stated as uncontrolled   . Unspecified asthma(493.90)   . Unspecified essential hypertension   . Unspecified hemorrhoids without mention of complication   . Wrist fracture    Past Surgical History:  Procedure Laterality Date  . APPENDECTOMY    . CHOLECYSTECTOMY    . DOBUTAMINE STRESS ECHO  2009    normal stress echo, no evidence of ischemia  . ELBOW SURGERY Right    for nerve damage  . ESOPHAGOGASTRODUODENOSCOPY  07/2002   erythematous gastropathy  . KNEE ARTHROSCOPY W/ PARTIAL MEDIAL MENISCECTOMY Right 10/2008   and chondroplasty  . MRI  11/2102   pt states she has a bone spur on her spine  . PARTIAL HYSTERECTOMY    . SHOULDER SURGERY Left 05/2010   Dr. Alphonzo Severance  . TOTAL KNEE ARTHROPLASTY  2011   Dr. Ricki Rodriguez   Social History   Tobacco Use  . Smoking status: Former Smoker    Last attempt to quit: 12/30/2001    Years since quitting: 15.2  . Smokeless tobacco: Never Used  Substance Use Topics  . Alcohol use: No    Alcohol/week: 0.0 oz    Comment: former alcoholic   . Drug use: No   Family History  Problem Relation Age of Onset  . Alcohol abuse Mother   . Hypertension Mother   . Esophageal cancer Maternal Grandmother   . Lung cancer Unknown        mat great uncle  . Colon cancer Neg Hx    Allergies  Allergen Reactions  . Bupropion Hcl   . Cefuroxime Axetil Nausea Only  . Codeine     REACTION: nausea and vomiting, rash  . Lidocaine     REACTION: unknown  . Metformin     REACTION: GI  . Paroxetine     REACTION: doesn't agree  . Propoxyphene N-Acetaminophen     REACTION: wheezing  . Sulfonamide Derivatives     REACTION: rash  . Tramadol Hcl     REACTION: Causes Anxiety   Current Outpatient Medications on File Prior to Visit  Medication Sig Dispense Refill  . albuterol (PROVENTIL HFA;VENTOLIN HFA) 108 (90 Base) MCG/ACT inhaler Inhale 2 puffs into the lungs  every 6 (six) hours as needed for wheezing or shortness of breath. 18 g 1  . ciprofloxacin (CIPRO) 250 MG tablet Take 1 tablet (250 mg total) by mouth 2 (two) times daily. 14 tablet 0  . dicyclomine (BENTYL) 10 MG capsule Take 1 capsule (10 mg total) by mouth 3 (three) times daily as needed for  spasms. 90 capsule 1  . GLOBAL INJECT EASE INSULIN SYR 31G X 5/16" 0.5 ML MISC     . glucose blood (ONE TOUCH ULTRA TEST) test strip Use to test blood sugar 4 times daily as instructed. 125 each 2  . hydrochlorothiazide (HYDRODIURIL) 25 MG tablet TAKE 1 TABLET BY MOUTH DAILY 30 tablet 5  . hydrocortisone (ANUSOL-HC) 25 MG suppository Place 1 suppository (25 mg total) rectally 2 (two) times daily. 12 suppository 0  . insulin detemir (LEVEMIR) 100 UNIT/ML injection Inject 0.5 mLs (50 Units total) into the skin daily. 30 mL 1  . Insulin Pen Needle (LEADER UNIFINE PENTIPS PLUS) 31G X 5 MM MISC Use twice daily as instructed. 100 each 2  . insulin regular (HUMULIN R) 100 units/mL injection Inject 0.14-0.24 mLs (14-24 Units total) into the skin 3 (three) times daily before meals. 20 mL 11  . Insulin Syringe-Needle U-100 (B-D INSULIN SYRINGE) 31G X 5/16" 0.3 ML MISC Use 3x a day 200 each 3  . Lancets (ONETOUCH ULTRASOFT) lancets Use to test blood sugar 4 times daily as instructed. 125 each 2  . LORazepam (ATIVAN) 1 MG tablet Take 1 mg by mouth every 8 (eight) hours as needed for anxiety or sleep.     Marland Kitchen ondansetron (ZOFRAN ODT) 4 MG disintegrating tablet Take 1 tablet (4 mg total) by mouth every 6 (six) hours as needed for nausea or vomiting. 20 tablet 0  . oxyCODONE-acetaminophen (PERCOCET) 10-325 MG tablet     . pantoprazole (PROTONIX) 40 MG tablet Take 1 tablet (40 mg total) by mouth 2 (two) times daily. 180 tablet 3  . perphenazine (TRILAFON) 2 MG tablet Take 2 mg by mouth at bedtime.    . potassium chloride (K-DUR,KLOR-CON) 10 MEQ tablet TAKE 1 TABLET BY MOUTH DAILY 30 tablet 5  . sertraline (ZOLOFT) 100 MG  tablet Take 200 mg by mouth daily.     Marland Kitchen tiZANidine (ZANAFLEX) 4 MG tablet Take 4 mg by mouth 2 (two) times daily.     Marland Kitchen zolpidem (AMBIEN) 10 MG tablet Take 15 mg by mouth at bedtime as needed for sleep.     . [DISCONTINUED] potassium chloride (KLOR-CON) 10 MEQ CR tablet Take 1 tablet (10 mEq total) by mouth daily. 60 tablet 6   No current facility-administered medications on file prior to visit.     Review of Systems  Constitutional: Positive for fatigue. Negative for activity change, appetite change, fever and unexpected weight change.  HENT: Negative for congestion, rhinorrhea, sore throat and trouble swallowing.   Eyes: Negative for pain, redness, itching and visual disturbance.  Respiratory: Positive for chest tightness and wheezing. Negative for cough and shortness of breath.        Intermittent wheeze -worse at night    Cardiovascular: Negative for chest pain and palpitations.  Gastrointestinal: Negative for abdominal pain, blood in stool, constipation, diarrhea and nausea.  Endocrine: Negative for cold intolerance, heat intolerance, polydipsia and polyuria.  Genitourinary: Positive for frequency. Negative for difficulty urinating, dysuria and urgency.  Musculoskeletal: Negative for arthralgias, joint swelling and myalgias.  Skin: Negative for pallor and rash.  Neurological: Negative for dizziness, tremors, weakness, numbness and headaches.  Hematological: Negative for adenopathy. Does not bruise/bleed easily.  Psychiatric/Behavioral: Positive for dysphoric mood. Negative for decreased concentration. The patient is nervous/anxious.        Objective:   Physical Exam  Constitutional: She appears well-developed and well-nourished. No distress.  Morbidly obese and well app  HENT:  Head: Normocephalic and  atraumatic.  Mouth/Throat: Oropharynx is clear and moist.  Eyes: Conjunctivae and EOM are normal. Pupils are equal, round, and reactive to light.  Neck: Normal range of motion.  Neck supple. No JVD present. Carotid bruit is not present. No thyromegaly present.  Cardiovascular: Normal rate, regular rhythm, normal heart sounds and intact distal pulses. Exam reveals no gallop.  Pulmonary/Chest: Effort normal and breath sounds normal. No respiratory distress. She has no wheezes. She has no rales.  No crackles  Good air exch No wheeze today  Abdominal: Soft. Bowel sounds are normal. She exhibits no distension, no abdominal bruit and no mass. There is tenderness.  Baseline epigastric tenderness/mild (pt states from gastroparesis)  Musculoskeletal: She exhibits no edema.  Lymphadenopathy:    She has no cervical adenopathy.  Neurological: She is alert. She has normal reflexes. No cranial nerve deficit. She exhibits normal muscle tone. Coordination normal.  Skin: Skin is warm and dry. No rash noted.  Dry skin on hands and feet with areas of scale and peeling  Psychiatric: She has a normal mood and affect.          Assessment & Plan:   Problem List Items Addressed This Visit      Cardiovascular and Mediastinum   Essential hypertension    bp in fair control at this time  BP Readings from Last 1 Encounters:  04/05/17 128/84   No changes needed Disc lifstyle change with low sodium diet and exercise          Respiratory   Asthma    Pt uses albuterol prn and generally needs tx when she gets sick She does mention issues with hosp and anesthesia in the past  Since she is here for preop clearance-will ref to pulmonary to discuss this       Relevant Orders   Ambulatory referral to Pulmonology     Endocrine   Type 2 diabetes mellitus with peripheral neuropathy ([place])    Per pt-recently improved  Urged her to check in with her endocrinologist before knee surgery        Other   Morbid obesity ([place])    This inc risks mildly for upcoming surgery  She has done well with prev ortho surgeries       Pre-operative exam - Primary    Rev hx and medications and  med intolerances In light of hx of ? Anesthesia reaction /asthma exacerbation during surgery-ref to pulmonary to help clear for upcoming knee surgery No blood thinners  Morbid obesity noted as well as former smoking  Reassuring vitals  EKG is stable with low voltage that is baseline for her  Will send note to Dr Maureen Ralphs      Relevant Orders   Ambulatory referral to Pulmonology    Other Visit Diagnoses    Pre-operative cardiovascular examination       Relevant Orders   EKG 12-Lead (Completed)   Need for influenza vaccination       Relevant Orders   Flu Vaccine QUAD 6+ mos PF IM (Fluarix Quad PF) (Completed)

## 2017-04-05 NOTE — Patient Instructions (Addendum)
It may be a good idea to get in with Dr Renne Crigler before your knee surgery or at least talk to her to check in   I want to refer you to pulmonary (lung specialist) - regarding your asthma and prior experience with surgery   EKG is stable  Blood pressure is ok as well   I will send note to Dr Maureen Ralphs

## 2017-04-06 NOTE — Progress Notes (Signed)
[facility] Consultation      Assessment and Plan:  The patient is a 56 year old female with asthma, excessive daytime sleepiness, dysphagia.  Asthma, not adequately controlled. - Allergy symptoms consistent with allergic asthma.  Has symptoms particularly at night with difficulty breathing. -We will start Qvar 2 puffs twice daily, she is asked to use her rescue inhaler when needed, which she has been avoiding.  Dysphasia/GERD with gastroparesis. -History of esophageal stretching in the past, now with recurrent symptoms of dysphagia and difficulty swallowing, as well as uncontrolled GERD despite use of Protonix which may be making her asthma worse. -Continue Protonix, will refer to gastroenterology to see if she needs to have her esophagus stretched once again.  Excessive daytime sleepiness with snoring. -Snoring with witnessed apneas, which may be contributing to poor asthma control. -We will send for sleep study.  Allergic rhinitis. - Intermittent symptoms, mostly in the spring time. -We will consider adding nasal steroid if asthma not improved at next visit.  Meds ordered this encounter  Medications  . beclomethasone (QVAR) 80 MCG/ACT inhaler    Sig: Inhale 2 puffs 2 (two) times daily into the lungs. Rinse mouth after use.    Dispense:  1 Inhaler    Refill:  5   Orders Placed This Encounter  Procedures  . Ambulatory referral to Gastroenterology  . Home sleep test    Return in about 3 months (around 07/09/2017).   Date: 04/08/2017  MRN# [id number] Alexa Woods Aug 04, 1960    Alexa Woods is a 56 y.o. old female seen in consultation for chief complaint of:    Chief Complaint  Patient presents with  . Advice Only    Referred by Dr. Jerilynn Mages.[facility] evaluation of asthma  . Shortness of Breath    with cough  . Cough    HPI:  She notices that she has drainage during the day and night that feels that it is going down her throat. She was diagnosed with  asthma several years ago, she used to smoke, quit 15 years ago, 1 ppd at that time.  She used to have ED visit requiring nebulizer treatments, she has not been to the hospital since she quit smoking. She is currently on albuterol  About once per week, she needs it more if she is around trees, esp in spring. She has not been using her albuterol because she did not want to be become dependent on it.  She takes nothing for allergies, no nasal spray. She was on advair but stopped a few years ago.  She snores at night, and wake herself up snoring. She notices that she stops breathing in her sleep. She was told in the past during a surgery that she needs to get a sleep study but avoided due to financial concerns. Her twin sister had OSA.   She has a diagnosis of dysphagia, and has her esophagus stretched back in 2012. She is starting to feel that things are getting stuck when swallowing again.  She continues to have "bad" reflux, despite taking reflux.   Imaging personally reviewed; chest x-ray 06/05/16; chest x-ray shows changes of chronic bronchitis, otherwise normal CT chest 09/03/14, cardiomegaly with changes suggestive of pulmonary hypertension.   PMHX:   Past Medical History:  Diagnosis Date  . Alcohol abuse, unspecified   . Allergic rhinitis, cause unspecified   . Anxiety state, unspecified   . Backache, unspecified   . Depressive disorder, not elsewhere classified   . Dysphagia, unspecified(787.20)   .  Edema   . Epigastric abdominal pain   . Esophageal reflux   . Gastroparesis   . GERD (gastroesophageal reflux disease)   . Herpes zoster without mention of complication   . History of Bell's palsy 4/09  . Hypopotassemia   . IBS (irritable bowel syndrome)   . Morbid obesity ([place])   . Myocardial infarction ([place])   . Other chronic cystitis   . Other psoriasis   . Other screening mammogram   . Pain in joint, lower leg   . Type II or unspecified type diabetes mellitus without mention of  complication, not stated as uncontrolled   . Unspecified asthma(493.90)   . Unspecified essential hypertension   . Unspecified hemorrhoids without mention of complication   . Wrist fracture    Surgical Hx:  Past Surgical History:  Procedure Laterality Date  . APPENDECTOMY    . CHOLECYSTECTOMY    . DOBUTAMINE STRESS ECHO  2009    normal stress echo, no evidence of ischemia  . ELBOW SURGERY Right    for nerve damage  . ESOPHAGOGASTRODUODENOSCOPY  07/2002   erythematous gastropathy  . KNEE ARTHROSCOPY W/ PARTIAL MEDIAL MENISCECTOMY Right 10/2008   and chondroplasty  . MRI  11/2102   pt states she has a bone spur on her spine  . PARTIAL HYSTERECTOMY    . SHOULDER SURGERY Left 05/2010   Dr. Alphonzo Severance  . TOTAL KNEE ARTHROPLASTY  2011   Dr. Ricki Rodriguez   Family Hx:  Family History  Problem Relation Age of Onset  . Alcohol abuse Mother   . Hypertension Mother   . Esophageal cancer Maternal Grandmother   . Lung cancer Unknown        mat great uncle  . Colon cancer Neg Hx    Social Hx:   Social History   Tobacco Use  . Smoking status: Former Smoker    Last attempt to quit: 12/30/2001    Years since quitting: 15.2  . Smokeless tobacco: Never Used  Substance Use Topics  . Alcohol use: No    Alcohol/week: 0.0 oz    Comment: former alcoholic   . Drug use: No   Medication:    Current Outpatient Medications:  .  albuterol (PROVENTIL HFA;VENTOLIN HFA) 108 (90 Base) MCG/ACT inhaler, Inhale 2 puffs into the lungs every 6 (six) hours as needed for wheezing or shortness of breath., Disp: 18 g, Rfl: 1 .  ciprofloxacin (CIPRO) 250 MG tablet, Take 1 tablet (250 mg total) by mouth 2 (two) times daily., Disp: 14 tablet, Rfl: 0 .  dicyclomine (BENTYL) 10 MG capsule, Take 1 capsule (10 mg total) by mouth 3 (three) times daily as needed for spasms., Disp: 90 capsule, Rfl: 1 .  GLOBAL INJECT EASE INSULIN SYR 31G X 5/16" 0.5 ML MISC, , Disp: , Rfl:  .  glucose blood (ONE TOUCH ULTRA TEST) test  strip, Use to test blood sugar 4 times daily as instructed., Disp: 125 each, Rfl: 2 .  hydrochlorothiazide (HYDRODIURIL) 25 MG tablet, TAKE 1 TABLET BY MOUTH DAILY, Disp: 30 tablet, Rfl: 5 .  hydrocortisone (ANUSOL-HC) 25 MG suppository, Place 1 suppository (25 mg total) rectally 2 (two) times daily., Disp: 12 suppository, Rfl: 0 .  insulin detemir (LEVEMIR) 100 UNIT/ML injection, Inject 0.5 mLs (50 Units total) into the skin daily., Disp: 30 mL, Rfl: 1 .  Insulin Pen Needle (LEADER UNIFINE PENTIPS PLUS) 31G X 5 MM MISC, Use twice daily as instructed., Disp: 100 each, Rfl: 2 .  insulin  regular (HUMULIN R) 100 units/mL injection, Inject 0.14-0.24 mLs (14-24 Units total) into the skin 3 (three) times daily before meals., Disp: 20 mL, Rfl: 11 .  Insulin Syringe-Needle U-100 (B-D INSULIN SYRINGE) 31G X 5/16" 0.3 ML MISC, Use 3x a day, Disp: 200 each, Rfl: 3 .  Lancets (ONETOUCH ULTRASOFT) lancets, Use to test blood sugar 4 times daily as instructed., Disp: 125 each, Rfl: 2 .  LORazepam (ATIVAN) 1 MG tablet, Take 1 mg by mouth every 8 (eight) hours as needed for anxiety or sleep. , Disp: , Rfl:  .  ondansetron (ZOFRAN ODT) 4 MG disintegrating tablet, Take 1 tablet (4 mg total) by mouth every 6 (six) hours as needed for nausea or vomiting., Disp: 20 tablet, Rfl: 0 .  oxyCODONE-acetaminophen (PERCOCET) 10-325 MG tablet, , Disp: , Rfl:  .  pantoprazole (PROTONIX) 40 MG tablet, Take 1 tablet (40 mg total) by mouth 2 (two) times daily., Disp: 180 tablet, Rfl: 3 .  perphenazine (TRILAFON) 2 MG tablet, Take 2 mg by mouth at bedtime., Disp: , Rfl:  .  potassium chloride (K-DUR,KLOR-CON) 10 MEQ tablet, TAKE 1 TABLET BY MOUTH DAILY, Disp: 30 tablet, Rfl: 5 .  sertraline (ZOLOFT) 100 MG tablet, Take 200 mg by mouth daily. , Disp: , Rfl:  .  tiZANidine (ZANAFLEX) 4 MG tablet, Take 4 mg by mouth 2 (two) times daily. , Disp: , Rfl:  .  zolpidem (AMBIEN) 10 MG tablet, Take 15 mg by mouth at bedtime as needed for sleep.  , Disp: , Rfl:    Allergies:  Bupropion hcl; Cefuroxime axetil; Codeine; Lidocaine; Metformin; Paroxetine; Propoxyphene n-acetaminophen; Sulfonamide derivatives; and Tramadol hcl  Review of Systems: Gen:  Denies  fever, sweats, chills HEENT: Denies blurred vision, double vision. bleeds, sore throat Cvc:  No dizziness, chest pain. Resp:   Denies cough or sputum production, shortness of breath Gi: Denies swallowing difficulty, stomach pain. Gu:  Denies bladder incontinence, burning urine Ext:   No Joint pain, stiffness. Skin: No skin rash,  hives  Endoc:  No polyuria, polydipsia. Psych: No depression, insomnia. Other:  All other systems were reviewed with the patient and were negative other that what is mentioned in the HPI.   Physical Examination:   VS: BP (!) 150/106 (BP Location: Left Arm, Cuff Size: Large)   Pulse 84   Resp 16   Ht 5\' 6"  (1.676 m)   Wt 254 lb (115.2 kg)   SpO2 96%   BMI 41.00 kg/m   General Appearance: No distress  Neuro:without focal findings,  speech normal,  HEENT: PERRLA, EOM intact.  Mallampati 3 Pulmonary: normal breath sounds, No wheezing.  CardiovascularNormal S1,S2.  No m/r/g.   Abdomen: Benign, Soft, non-tender. Renal:  No costovertebral tenderness  GU:  No performed at this time. Endoc: No evident thyromegaly, no signs of acromegaly. Skin:   warm, no rashes, no ecchymosis  Extremities: normal, no cyanosis, clubbing.  Other findings:    LABORATORY PANEL:   CBC No results for input(s): WBC, HGB, HCT, PLT in the last 168 hours. ------------------------------------------------------------------------------------------------------------------  Chemistries  No results for input(s): NA, K, CL, CO2, GLUCOSE, BUN, CREATININE, CALCIUM, MG, AST, ALT, ALKPHOS, BILITOT in the last 168 hours.  Invalid input(s): GFRCGP ------------------------------------------------------------------------------------------------------------------  Cardiac  Enzymes No results for input(s): TROPONINI in the last 168 hours. ------------------------------------------------------------  RADIOLOGY:  No results found.     Thank  you for the consultation and for allowing [facility] to assist in the care of  your patient. Our recommendations are noted above.  Please contact us if we can be of further service.   Marda Stalker, MD.  Board Certified in Internal Medicine, Pulmonary Medicine, [place], and Sleep Medicine.  [facility] Office Number: [phone]  Patricia Pesa, M.D.  Merton Border, M.D  04/08/2017

## 2017-04-07 ENCOUNTER — Telehealth: Payer: Self-pay | Admitting: Internal Medicine

## 2017-04-07 NOTE — Telephone Encounter (Signed)
Patient is no longer taking Jardiance because there are too many side effects. It is not worth risking the side effects. Patient said she is a lot better than she was due to taking insulin.  Patient never took the [place] because she is too afraid of the side effects seen on commercials and reading pamphlet (e.g.[place] of private area)

## 2017-04-07 NOTE — Telephone Encounter (Signed)
Wanted you to be aware. Thanks!   

## 2017-04-08 ENCOUNTER — Ambulatory Visit (INDEPENDENT_AMBULATORY_CARE_PROVIDER_SITE_OTHER): Payer: Managed Care, Other (non HMO) | Admitting: Internal Medicine

## 2017-04-08 ENCOUNTER — Encounter: Payer: Self-pay | Admitting: Internal Medicine

## 2017-04-08 VITALS — BP 150/106 | HR 84 | Resp 16 | Ht 66.0 in | Wt 254.0 lb

## 2017-04-08 DIAGNOSIS — K2100 Gastro-esophageal reflux disease with esophagitis, without bleeding: Secondary | ICD-10-CM

## 2017-04-08 DIAGNOSIS — G4719 Other hypersomnia: Secondary | ICD-10-CM | POA: Diagnosis not present

## 2017-04-08 MED ORDER — BECLOMETHASONE DIPROP HFA 80 MCG/ACT IN AERB: 2.0000 | INHALATION_SPRAY | Freq: Two times a day (BID) | RESPIRATORY_TRACT | 5 refills | Status: DC

## 2017-04-08 NOTE — Patient Instructions (Addendum)
Use qvar inhaler 2 puffs twice daily, rinse mouth after use.  Continue using your proair inhaler as needed.  Will send for sleep study.

## 2017-04-13 ENCOUNTER — Other Ambulatory Visit: Payer: Self-pay | Admitting: Internal Medicine

## 2017-04-23 IMAGING — CT CT RENAL STONE PROTOCOL
3 of 4 series · 10 of 46 positions shown, 17 images · non-contrast
Comparison: 05/27/2013

CLINICAL DATA: Left upper abdominal pain and left flank pain 1
year. Pain worse over the past 2 days. Previous appendectomy and
cholecystectomy.

EXAM:
CT ABDOMEN AND PELVIS WITHOUT CONTRAST
TECHNIQUE: Multidetector CT imaging of the abdomen and pelvis was performed
following the standard protocol without IV contrast.

[Series 4: lung · axial · 0.87mm/px · z∈[-389,-289]mm · 6 of 29 slices shown, 11 images]
[im 5/29  soft-tissue]
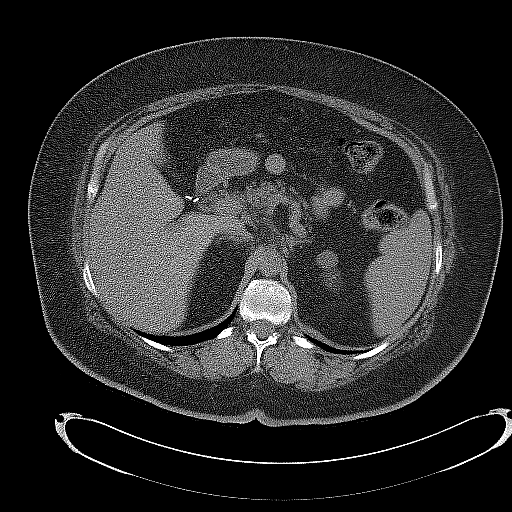
[im 5/29  bone]
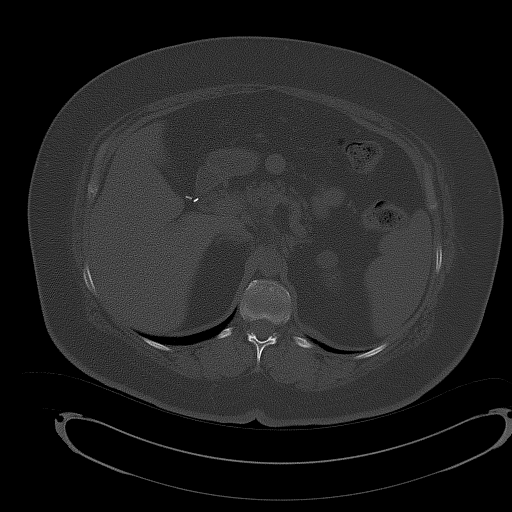
[im 9/29  soft-tissue]
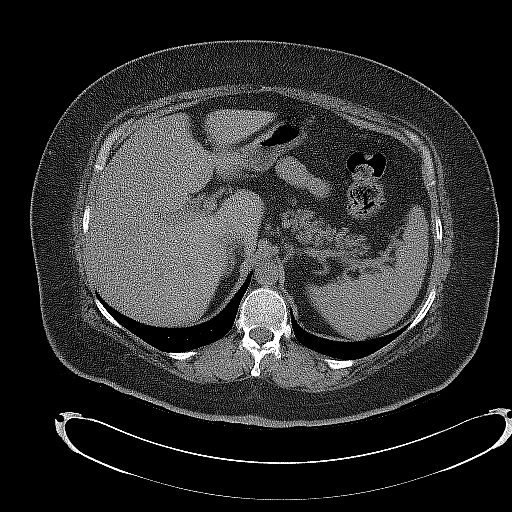
[im 13/29  soft-tissue]
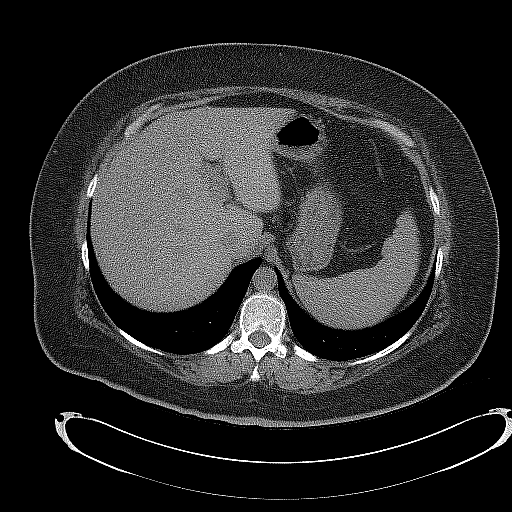
[im 13/29  lung]
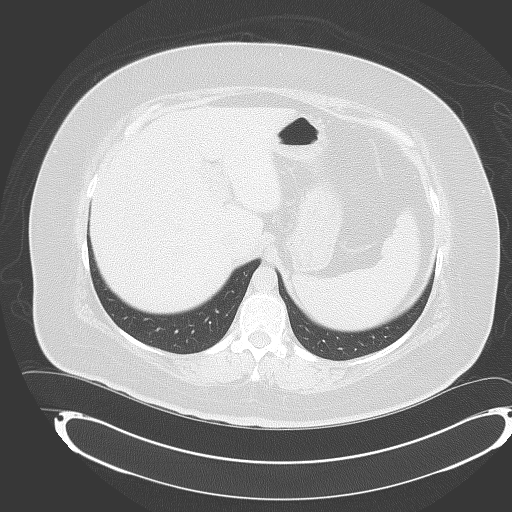
[im 17/29  soft-tissue]
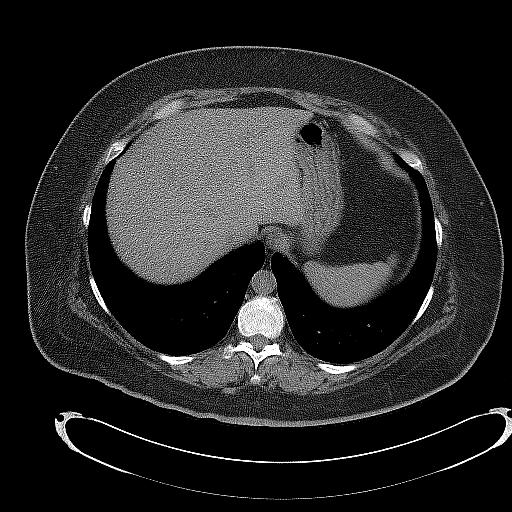
[im 17/29  lung]
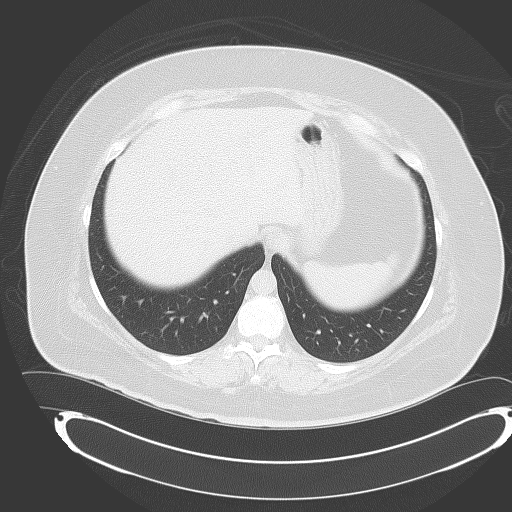
[im 21/29  soft-tissue]
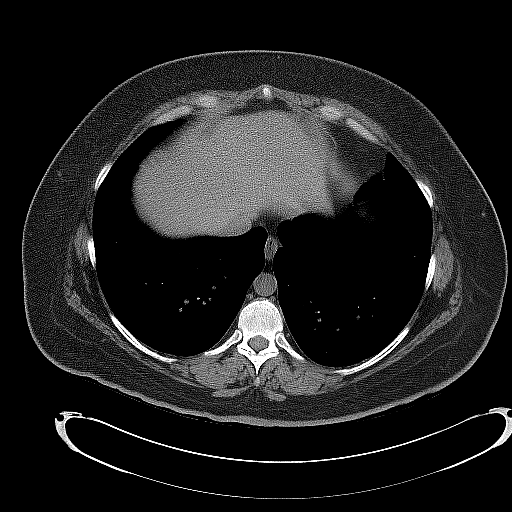
[im 21/29  lung]
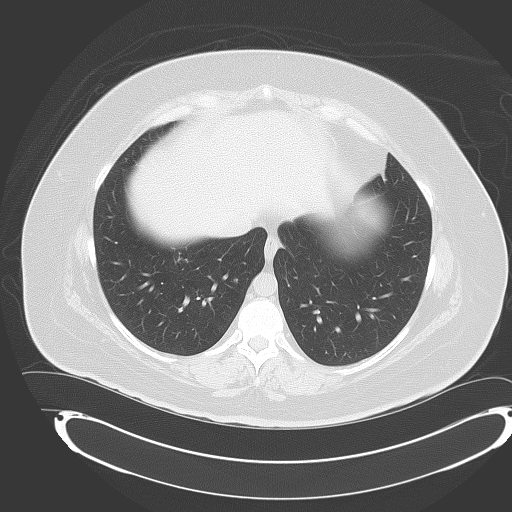
[im 25/29  soft-tissue]
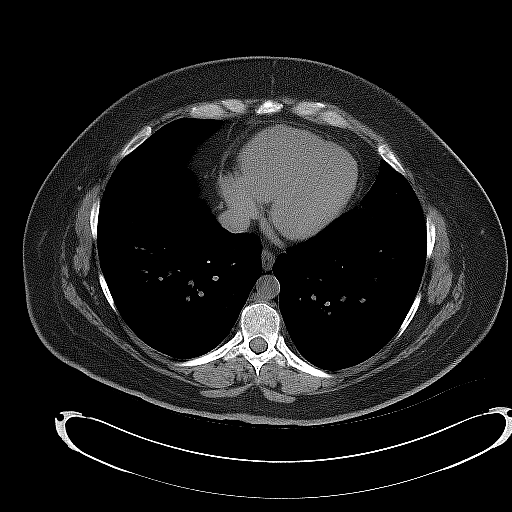
[im 25/29  lung]
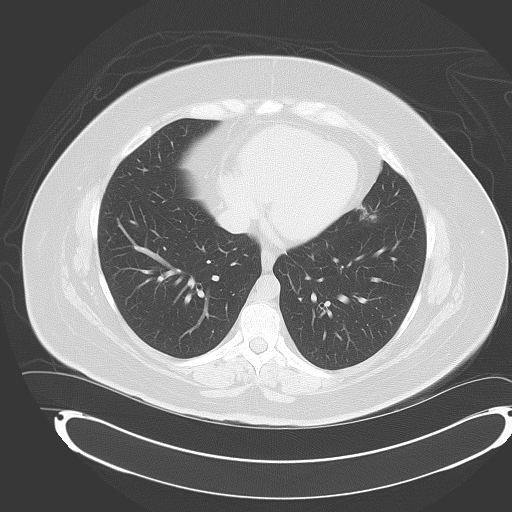

[Series 5: coronal · coronal · 0.81mm/px · 3 of 163 slices shown, 4 images]
[im 55/163  soft-tissue]
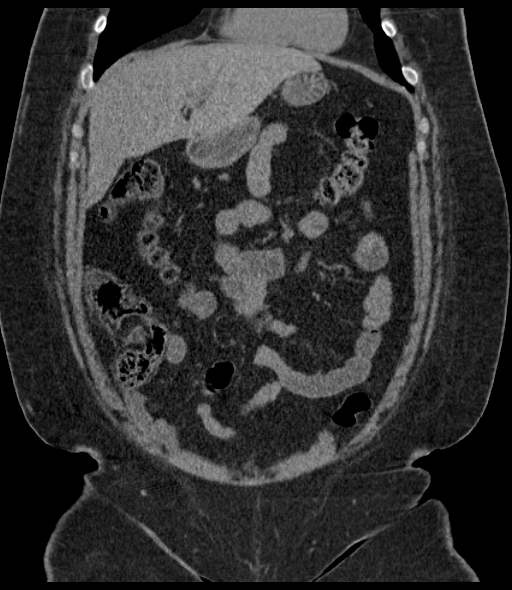
[im 73/163  soft-tissue]
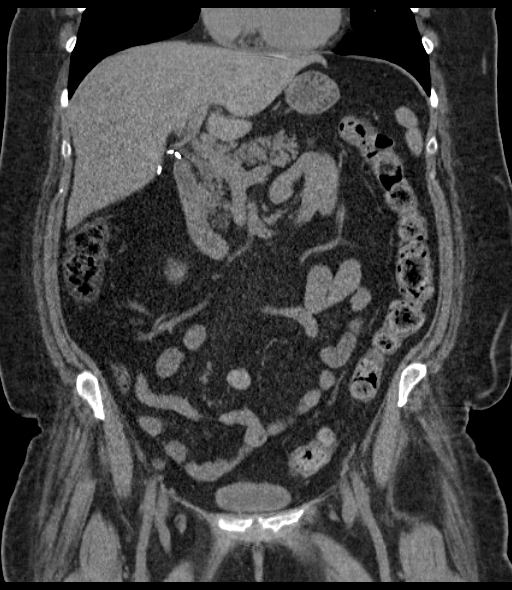
[im 73/163  bone]
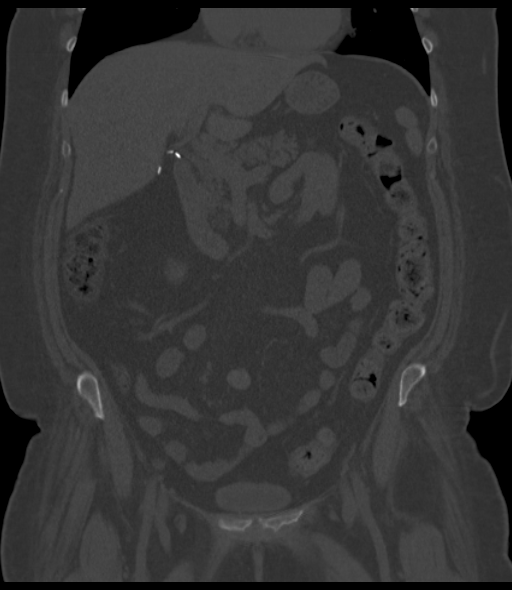
[im 91/163  soft-tissue]
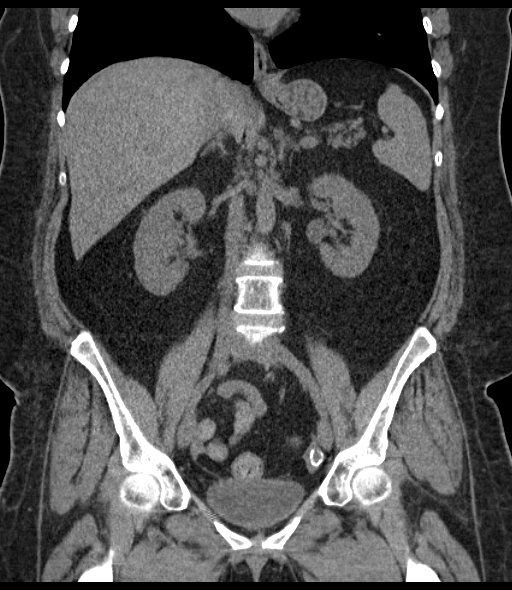

[Series 6: sagittal · sagittal · 0.64mm/px · 1 of 204 slices shown, 2 images]
[im 68/204  soft-tissue]
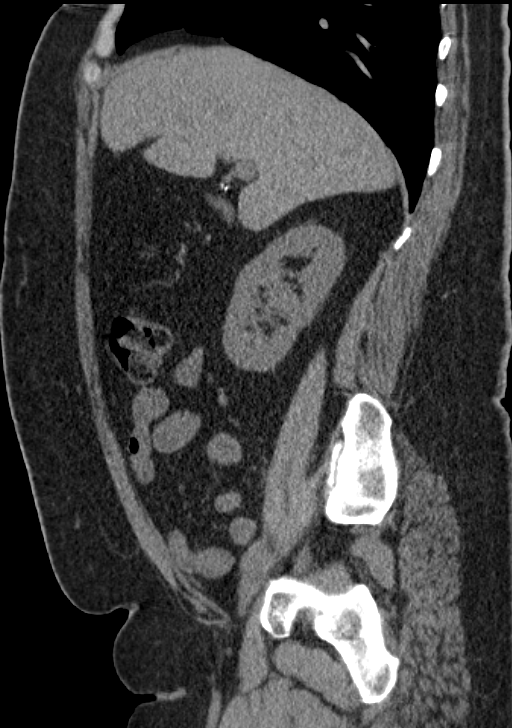
[im 68/204  bone]
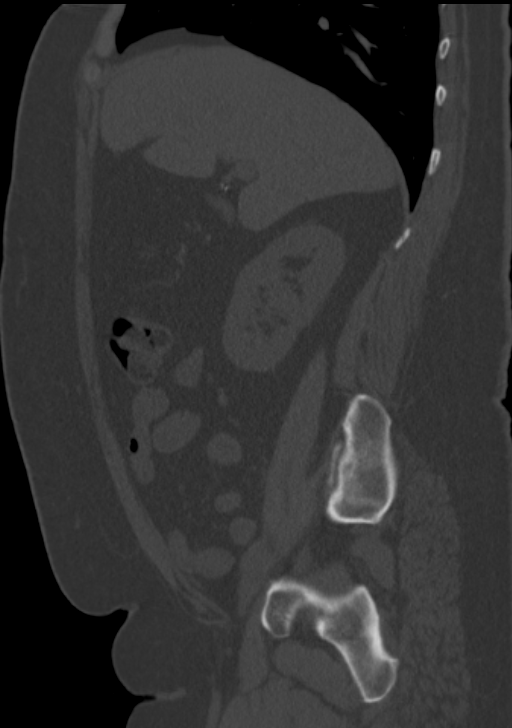

[10 of 46 positions shown; findings below may reference images not displayed]

FINDINGS: Lung bases demonstrate a stable 3-4 mm nodule over the lateral left
base. Subtle focal opacification over the lingula which may be due
to atelectasis or early infection.

Abdominal images demonstrate evidence of a previous cholecystectomy.
The liver, spleen, pancreas and adrenal glands are normal. Stomach
is normal.

Kidneys normal in size without hydronephrosis or nephrolithiasis.
Small single bilateral renal cysts are unchanged. Ureters are
normal.

Colon is within normal. Small bowel is within normal. There is no
free fluid or focal inflammatory change.

Subtle calcification over the distal abdominal aorta and left common
iliac artery.

Pelvic images demonstrate surgical absence of the uterus. The
ovaries, bladder and rectum are normal. Intramuscular lipoma over
the left sartorius muscle. Minimal degenerate change of the spine
and hips.
IMPRESSION: No acute findings in the abdomen/pelvis.

Subtle focal opacification over the lingula which may be due to
atelectasis or early infection. Recommend chest radiographic
correlation and consider followup CT 3-4 weeks.

Small single bilateral simple renal cysts unchanged.

## 2017-04-27 ENCOUNTER — Ambulatory Visit: Payer: Managed Care, Other (non HMO) | Admitting: Internal Medicine

## 2017-05-13 ENCOUNTER — Other Ambulatory Visit: Payer: Self-pay | Admitting: Internal Medicine

## 2017-06-08 ENCOUNTER — Encounter: Payer: Self-pay | Admitting: Internal Medicine

## 2017-06-08 DIAGNOSIS — G4733 Obstructive sleep apnea (adult) (pediatric): Secondary | ICD-10-CM | POA: Diagnosis not present

## 2017-06-08 DIAGNOSIS — K2100 Gastro-esophageal reflux disease with esophagitis, without bleeding: Secondary | ICD-10-CM

## 2017-06-08 DIAGNOSIS — G4719 Other hypersomnia: Secondary | ICD-10-CM

## 2017-06-11 ENCOUNTER — Ambulatory Visit (INDEPENDENT_AMBULATORY_CARE_PROVIDER_SITE_OTHER): Payer: Managed Care, Other (non HMO) | Admitting: Internal Medicine

## 2017-06-11 ENCOUNTER — Encounter: Payer: Self-pay | Admitting: Internal Medicine

## 2017-06-11 VITALS — BP 142/80 | HR 85 | Ht 66.0 in | Wt 248.0 lb

## 2017-06-11 DIAGNOSIS — G4733 Obstructive sleep apnea (adult) (pediatric): Secondary | ICD-10-CM | POA: Diagnosis not present

## 2017-06-11 DIAGNOSIS — E1142 Type 2 diabetes mellitus with diabetic polyneuropathy: Secondary | ICD-10-CM | POA: Diagnosis not present

## 2017-06-11 DIAGNOSIS — E785 Hyperlipidemia, unspecified: Secondary | ICD-10-CM

## 2017-06-11 LAB — POCT GLYCOSYLATED HEMOGLOBIN (HGB A1C): HEMOGLOBIN A1C: 9.7

## 2017-06-11 MED ORDER — INSULIN DETEMIR 100 UNIT/ML ~~LOC~~ SOLN: SUBCUTANEOUS | 3 refills | Status: DC

## 2017-06-11 MED ORDER — INSULIN REGULAR HUMAN 100 UNIT/ML IJ SOLN: 14.0000 [IU] | Freq: Three times a day (TID) | INTRAMUSCULAR | 3 refills | Status: DC

## 2017-06-11 NOTE — Patient Instructions (Signed)
Please continue: - Levemir 50 units at night  - Humulin R insulin: - Mealtime insulin:  20-24 units before b'fast and lunch 15-18 units before dinner - Sliding scale: - 150-175: + 1 unit  - 176-200: + 2 units  - 201-225: + 3 units  - 226-250: + 4 units  - >250: + 5 units  If you plan to be more active after a meal, please reduce the insulin before that meal by 5 units.  Please come back for a follow-up appointment in 3 months.

## 2017-06-11 NOTE — Addendum Note (Signed)
Addended by: Drucilla Schmidt on: 06/11/2017 04:31 PM   Modules accepted: Orders

## 2017-06-11 NOTE — Progress Notes (Signed)
Patient ID: Alexa Woods, female   DOB: [birth year], 57 y.o.   MRN: [id number]  HPI: Alexa Woods is a 57 y.o.-year-old female, returning for f/u for DM2 dx 1997, insulin-dependent since 2010, uncontrolled, with complications (gastroparesis - dx 2012, PN). Last visit 4.5 months ago  She continues to be very stressed as her husband drinks (but he is not violent).    She has a torn meniscus in her left knee >> had surgery in 04/2017 >> still a lot of pain. Got a steroid inj in 05/2017.  Last hemoglobin A1c was: Lab Results  Component Value Date   HGBA1C 10.3 01/21/2017   HGBA1C 10.0 10/05/2016   HGBA1C 10.4 04/07/2016  10/05/2016: HbA1c calculated from fructosamine: 8.78% (higher, but better than the one measured) 07/09/2016: HbA1c calculated from the fructosamine is much better, 6.5%.  10/01/2015: HbA1c calculated from fructosamine is 8.9%.  She is on - Levemir 50 units at night  - Humulin R insulin: - Mealtime insulin:  20-24 units before b'fast and lunch 15-18 units before dinner - Sliding scale: - 150-175: + 1 unit  - 176-200: + 2 units  - 201-225: + 3 units  - 226-250: + 4 units  - >250: + 5 units Could not tolerate Metformin >> GI upset. (N+V) Tried Januvia >> nausea. Did not want to start Jardiance due to possible side effects  Pt checks her sugars mostly once a day: - am:  111-198, 231 >> 170, 200-230 >>139, 180-294, 420 >> 11-214, 288, 498 - after b'fast: 151-219 >> 244-360 >> n/c - before lunch: 177-201 >> 220-240 >> 208, 218 >> n/c - after lunch: 163-227, 326 >> n/c - before dinner: n/c >> 111-328 >> n/c - after dinner: 211-233 >> 99, 102 >> 200s >> 202 >> [place] - bedtime: 173, 215 >> n/c >> n/c Lowest sugar was  79 (before dinner) x1 >> 50 (more active); she has hypoglycemia awareness at 90.  Highest sugar was 420 >> 498  Pt's meals are: - Breakfast: bowl of cereal (rice krispies, lucky charms) with milk 2% - Lunch: PB sandwich - Dinner: pork chop +  rice/potatoes + green beans  - Snacks: no; smtms apples or bananas She is limited in what she can eat due to her gastroparesis.  -No CKD, last BUN/creatinine:  Lab Results  Component Value Date   BUN 15 11/09/2016   CREATININE 1.01 11/09/2016  Not on ACE inhibitors.   Last ACR normal: Lab Results  Component Value Date   MICRALBCREAT 0.4 03/08/2013   MICRALBCREAT 0.3 04/19/2012   -+ History of hyperlipidemia; Last set of lipids: Lab Results  Component Value Date   CHOL 187 11/09/2016   HDL 66.50 11/09/2016   LDLCALC 98 11/09/2016   LDLDIRECT 126.8 09/23/2012   TRIG 116.0 11/09/2016   CHOLHDL 3 11/09/2016  Not on a statin. - last eye exam was in ~2009: No DR. her eye exams would now be covered by the insurance. - + numbness and tingling in her feet.  ROS: Constitutional: no weight gain/no weight loss, no fatigue, no subjective hyperthermia, no subjective hypothermia Eyes: + blurry vision, no xerophthalmia ENT: no sore throat, no nodules palpated in throat, no dysphagia, no odynophagia, no hoarseness Cardiovascular: no CP/no SOB/+ palpitations/+ leg swelling Respiratory: no cough/no SOB/no wheezing Gastrointestinal: no N/no V/no D/+ C/no acid reflux Musculoskeletal: no muscle aches/+ joint aches Skin: + rashes, no hair loss Neurological: no tremors/+ numbness/+ tingling/no dizziness  I reviewed pt's medications, allergies, PMH, social hx,  family hx, and changes were documented in the history of present illness. Otherwise, unchanged from my initial visit note.  PE: BP (!) 142/80   Pulse 85   Ht 5\' 6"  (1.676 m)   Wt 248 lb (112.5 kg)   SpO2 97%   BMI 40.03 kg/m  Body mass index is 40.03 kg/m. 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 DM2, insulin-dependent, uncontrolled, with complications - gastroparesis - 07/2010 - At 120 minutes, the amount of tracer remaining in the stomach ~39% (nl <30%). Seen by Dr Sharlett Iles - recommended to start Domperidone a that time >> could not afford.  - PN  2. Obesity class 3 BMI Classification:  < 18.5 underweight   18.5-24.9 normal weight   25.0-29.9 overweight   30.0-34.9 class I obesity   35.0-39.9 class II obesity   ? 40.0 class III obesity   3. HL  PLAN:  1. Patient with long-standing, uncontrolled, diabetes, on basal-bolus insulin regimen.  At last visit, her sugars were higher so we increased insulin doses and we try to add Jardiance.  However, she did not start it as she was afraid of side effects. - her sugars are a little better and weight has improved as she was mostly immobilized after her knee Sx >> did not eat much - she may drop her sugars later in the day if she is active >> reduce the insulin of the preceding meal if she plans to be more active after a meal - will continue current regimen >> advised to also check sugars later in the day as now she is only checking in am - I suggested:  Patient Instructions  Please continue: - Levemir 50 units at night  - Humulin R insulin: - Mealtime insulin:  20-24 units before b'fast and lunch 15-18 units before dinner - Sliding scale: - 150-175: + 1 unit  - 176-200: + 2 units  - 201-225: + 3 units  - 226-250: + 4 units  - >250: + 5 units  If you plan to be more active after a meal, please reduce the insulin before that meal by 5 units.  Please come back for a follow-up appointment in 3 months.  -  today, HbA1c is 9.7% (slightly better) - continue checking sugars at different times of the day - check 3x a day, rotating checks - advised for yearly eye exams >> she is not UTD - Return to clinic in 3 mo with sugar lo   2. Obesity class 3 - At last visit, we started Jardiance to help with both diabetes and her weight.  She did not start this she was afraid of possible side effects. - she did lose 7 lbs in last 3 mo - less appetite, could not eat  3. HL - LDL decreased per last Lipid panel from 10/2016 - not on a statin  [name], MD PhD [facility]

## 2017-06-14 ENCOUNTER — Ambulatory Visit: Payer: Managed Care, Other (non HMO) | Admitting: Internal Medicine

## 2017-06-15 ENCOUNTER — Telehealth: Payer: Self-pay | Admitting: *Deleted

## 2017-06-15 DIAGNOSIS — G4733 Obstructive sleep apnea (adult) (pediatric): Secondary | ICD-10-CM

## 2017-06-15 NOTE — Telephone Encounter (Signed)
Pt aware of results. Orders placed  Nothing further needed. 

## 2017-06-22 DIAGNOSIS — M25562 Pain in left knee: Secondary | ICD-10-CM | POA: Insufficient documentation

## 2017-06-28 ENCOUNTER — Telehealth: Payer: Self-pay | Admitting: *Deleted

## 2017-06-28 NOTE — Telephone Encounter (Signed)
We made several attempts to schedule this patient's CPAP set up however, she did not return our calls. We did speak with her once and she said she would call us back but never did.   Thank you  [place]her[place]

## 2017-07-07 DIAGNOSIS — M25562 Pain in left knee: Secondary | ICD-10-CM | POA: Diagnosis not present

## 2017-07-09 ENCOUNTER — Ambulatory Visit: Payer: Managed Care, Other (non HMO) | Admitting: Internal Medicine

## 2017-07-13 ENCOUNTER — Encounter: Payer: Self-pay | Admitting: Internal Medicine

## 2017-07-14 ENCOUNTER — Encounter: Payer: Self-pay | Admitting: Internal Medicine

## 2017-07-14 ENCOUNTER — Ambulatory Visit (INDEPENDENT_AMBULATORY_CARE_PROVIDER_SITE_OTHER): Payer: Managed Care, Other (non HMO) | Admitting: Internal Medicine

## 2017-07-14 VITALS — BP 112/68 | HR 74 | Ht 66.0 in | Wt 251.0 lb

## 2017-07-14 DIAGNOSIS — K2100 Gastro-esophageal reflux disease with esophagitis, without bleeding: Secondary | ICD-10-CM

## 2017-07-14 DIAGNOSIS — G4733 Obstructive sleep apnea (adult) (pediatric): Secondary | ICD-10-CM | POA: Diagnosis not present

## 2017-07-14 MED ORDER — FLUTICASONE PROPIONATE 50 MCG/ACT NA SUSP: 2.0000 | Freq: Every day | NASAL | 0 refills | Status: DC

## 2017-07-14 NOTE — Progress Notes (Signed)
[facility] Consultation      Assessment and Plan:  The patient is a 57 year old female with asthma, excessive daytime sleepiness, dysphagia.  Asthma, not adequately controlled. -Likely allergic in etiology, symptoms were well controlled initially with Qvar, but she fell off with using this regularly. -Reiterated the use of Qvar 2 puffs twice daily to help control symptoms.  Dysphasia/GERD with gastroparesis. -History of esophageal stretching in the past, now with recurrent symptoms of dysphagia and difficulty swallowing, as well as uncontrolled GERD despite use of Protonix which may be making her asthma worse. -Continue Protonix, she has already been referred to gastroenterology to see if she needs to have her esophagus stretched once again.  She is going to call to make the appointment.  Obstructive Sleep apnea. --HST 06/08/17 showed AHI 13 -She has not yet gotten set up with CPAP, will refer her back to get started on auto CPAP.  Allergic rhinitis. - Intermittent symptoms, mostly in the spring time. -Adding nasal steroid.  If not improved asked to call back and we will add Singulair.  Meds ordered this encounter  Medications  . fluticasone (FLONASE) 50 MCG/ACT nasal spray    Sig: Place 2 sprays into both nostrils daily.    Dispense:  1 g    Refill:  0     Return in about 3 months (around 10/11/2017).   Date: 07/14/2017  MRN# [id number] Alexa Woods June 04, 1960    Alexa Woods is a 57 y.o. old female seen in consultation for chief complaint of:    Chief Complaint  Patient presents with  . Sleep Apnea    pt has not contacted [facility] for machine setup  . Gastroesophageal Reflux    better;  . Cough    no better    Synopsis: She had symptoms consistent with allergic asthma, she was started on Qvar and rescue inhaler as needed.  She was noted to have a history of esophageal stretching in the past, with recurrent symptoms of dysphagia, she was  therefore referred back to gastroenterology and asked to continue Protonix.  In addition she had symptoms suggestive of obstructive sleep apnea, therefore she was referred for a sleep study. Imaging personally reviewed; chest x-ray 06/05/16; chest x-ray shows changes of chronic bronchitis, otherwise normal CT chest 09/03/14, cardiomegaly with changes suggestive of pulmonary hypertension.  Subjective: Since her last visit she has had surgery on her left knee, and has issues since then. She has not yet seen a gastroenterologist. Her , she has not yet gone for her CPAP set up.  She has been started on qvar, but then stopped using it, and notes that her breathing is worse again.  She has been having allergy symptoms with nasal and eye symptoms.   She has 3 dogs not in bedroom.   Medication:    Current Outpatient Medications:  .  albuterol (PROVENTIL HFA;VENTOLIN HFA) 108 (90 Base) MCG/ACT inhaler, Inhale 2 puffs into the lungs every 6 (six) hours as needed for wheezing or shortness of breath., Disp: 18 g, Rfl: 1 .  beclomethasone (QVAR) 80 MCG/ACT inhaler, Inhale 2 puffs 2 (two) times daily into the lungs. Rinse mouth after use., Disp: 1 Inhaler, Rfl: 5 .  ciprofloxacin (CIPRO) 250 MG tablet, Take 1 tablet (250 mg total) by mouth 2 (two) times daily., Disp: 14 tablet, Rfl: 0 .  dicyclomine (BENTYL) 10 MG capsule, Take 1 capsule (10 mg total) by mouth 3 (three) times daily as needed for spasms., Disp: 90  capsule, Rfl: 1 .  GLOBAL INJECT EASE INSULIN SYR 31G X 5/16" 0.5 ML MISC, , Disp: , Rfl:  .  glucose blood (ONE TOUCH ULTRA TEST) test strip, Use to test blood sugar 4 times daily as instructed., Disp: 125 each, Rfl: 2 .  hydrochlorothiazide (HYDRODIURIL) 25 MG tablet, TAKE 1 TABLET BY MOUTH DAILY, Disp: 30 tablet, Rfl: 5 .  hydrocortisone (ANUSOL-HC) 25 MG suppository, Place 1 suppository (25 mg total) rectally 2 (two) times daily., Disp: 12 suppository, Rfl: 0 .  insulin detemir (LEVEMIR) 100 UNIT/ML  injection, Inject 50 units under skin daily at night, Disp: 50 mL, Rfl: 3 .  Insulin Pen Needle (LEADER UNIFINE PENTIPS PLUS) 31G X 5 MM MISC, Use twice daily as instructed., Disp: 100 each, Rfl: 2 .  insulin regular (HUMULIN R) 100 units/mL injection, Inject 0.14-0.24 mLs (14-24 Units total) into the skin 3 (three) times daily before meals., Disp: 50 mL, Rfl: 3 .  Insulin Syringe-Needle U-100 (GLOBAL INJECT EASE INSULIN SYR) 31G X 5/16" 0.5 ML MISC, USE THREE TIMES PER DAY, Disp: 200 each, Rfl: 5 .  Lancets (ONETOUCH ULTRASOFT) lancets, Use to test blood sugar 4 times daily as instructed., Disp: 125 each, Rfl: 2 .  LORazepam (ATIVAN) 1 MG tablet, Take 1 mg by mouth every 8 (eight) hours as needed for anxiety or sleep. , Disp: , Rfl:  .  ondansetron (ZOFRAN ODT) 4 MG disintegrating tablet, Take 1 tablet (4 mg total) by mouth every 6 (six) hours as needed for nausea or vomiting., Disp: 20 tablet, Rfl: 0 .  oxyCODONE-acetaminophen (PERCOCET) 10-325 MG tablet, , Disp: , Rfl:  .  pantoprazole (PROTONIX) 40 MG tablet, Take 1 tablet (40 mg total) by mouth 2 (two) times daily., Disp: 180 tablet, Rfl: 3 .  perphenazine (TRILAFON) 2 MG tablet, Take 2 mg by mouth at bedtime., Disp: , Rfl:  .  potassium chloride (K-DUR,KLOR-CON) 10 MEQ tablet, TAKE 1 TABLET BY MOUTH DAILY, Disp: 30 tablet, Rfl: 5 .  sertraline (ZOLOFT) 100 MG tablet, Take 200 mg by mouth daily. , Disp: , Rfl:  .  tiZANidine (ZANAFLEX) 4 MG tablet, Take 4 mg by mouth 2 (two) times daily. , Disp: , Rfl:  .  zolpidem (AMBIEN) 10 MG tablet, Take 15 mg by mouth at bedtime as needed for sleep. , Disp: , Rfl:    Allergies:  Bupropion hcl; Cefuroxime axetil; Codeine; Lidocaine; Metformin; Paroxetine; Propoxyphene n-acetaminophen; Sulfonamide derivatives; and Tramadol hcl  Review of Systems: Gen:  Denies  fever, sweats, chills HEENT: Denies blurred vision, double vision. bleeds, sore throat Cvc:  No dizziness, chest pain. Resp:   Denies cough or  sputum production, shortness of breath Gi: Denies swallowing difficulty, stomach pain. Gu:  Denies bladder incontinence, burning urine Ext:   No Joint pain, stiffness. Skin: No skin rash,  hives  Endoc:  No polyuria, polydipsia. Psych: No depression, insomnia. Other:  All other systems were reviewed with the patient and were negative other that what is mentioned in the HPI.   Physical Examination:   VS: BP 112/68 (BP Location: Left Arm, Cuff Size: Normal)   Pulse 74   Ht 5\' 6"  (1.676 m)   Wt 251 lb (113.9 kg)   SpO2 97%   BMI 40.51 kg/m   General Appearance: No distress  Neuro:without focal findings,  speech normal,  HEENT: PERRLA, EOM intact.  Mallampati 3 Pulmonary: normal breath sounds, No wheezing.  CardiovascularNormal S1,S2.  No m/r/g.   Abdomen: Benign, Soft, non-tender. Renal:  No costovertebral tenderness  GU:  No performed at this time. Endoc: No evident thyromegaly, no signs of acromegaly. Skin:   warm, no rashes, no ecchymosis  Extremities: normal, no cyanosis, clubbing.  Other findings:    LABORATORY PANEL:   CBC No results for input(s): WBC, HGB, HCT, PLT in the last 168 hours. ------------------------------------------------------------------------------------------------------------------  Chemistries  No results for input(s): NA, K, CL, CO2, GLUCOSE, BUN, CREATININE, CALCIUM, MG, AST, ALT, ALKPHOS, BILITOT in the last 168 hours.  Invalid input(s): GFRCGP ------------------------------------------------------------------------------------------------------------------  Cardiac Enzymes No results for input(s): TROPONINI in the last 168 hours. ------------------------------------------------------------  RADIOLOGY:  No results found.     Thank  you for the consultation and for allowing [facility] to assist in the care of your patient. Our recommendations are noted above.  Please contact us if we can be of further  service.   Marda Stalker, MD.  Board Certified in Internal Medicine, Pulmonary Medicine, [place], and Sleep Medicine.  [facility] Office Number: [phone]  Patricia Pesa, M.D.  Merton Border, M.D  07/14/2017

## 2017-07-14 NOTE — Patient Instructions (Signed)
Start flonase. If allergy symptoms continue call us and we can send in a prescription for singulair.  Restart qvar 2 puffs twice daily, rinse mouth after use.  Will start auto-CPAP.

## 2017-07-25 ENCOUNTER — Other Ambulatory Visit: Payer: Self-pay | Admitting: Family Medicine

## 2017-08-06 ENCOUNTER — Other Ambulatory Visit: Payer: Self-pay | Admitting: Internal Medicine

## 2017-08-06 MED ORDER — FLUTICASONE PROPIONATE 50 MCG/ACT NA SUSP: 2.0000 | Freq: Every day | NASAL | 1 refills | Status: DC

## 2017-08-06 NOTE — Telephone Encounter (Signed)
Paper refill request

## 2017-08-09 ENCOUNTER — Other Ambulatory Visit: Payer: Self-pay | Admitting: *Deleted

## 2017-08-09 MED ORDER — POTASSIUM CHLORIDE CRYS ER 10 MEQ PO TBCR: 10.0000 meq | EXTENDED_RELEASE_TABLET | Freq: Every day | ORAL | 1 refills | Status: DC

## 2017-08-11 DIAGNOSIS — S83232D Complex tear of medial meniscus, current injury, left knee, subsequent encounter: Secondary | ICD-10-CM | POA: Diagnosis not present

## 2017-08-11 DIAGNOSIS — M25562 Pain in left knee: Secondary | ICD-10-CM | POA: Diagnosis not present

## 2017-08-11 DIAGNOSIS — M1712 Unilateral primary osteoarthritis, left knee: Secondary | ICD-10-CM | POA: Diagnosis not present

## 2017-08-11 DIAGNOSIS — M25462 Effusion, left knee: Secondary | ICD-10-CM | POA: Diagnosis not present

## 2017-08-13 DIAGNOSIS — M542 Cervicalgia: Secondary | ICD-10-CM | POA: Diagnosis not present

## 2017-08-13 DIAGNOSIS — M545 Low back pain: Secondary | ICD-10-CM | POA: Diagnosis not present

## 2017-08-13 DIAGNOSIS — G8929 Other chronic pain: Secondary | ICD-10-CM | POA: Insufficient documentation

## 2017-08-13 DIAGNOSIS — Z79891 Long term (current) use of opiate analgesic: Secondary | ICD-10-CM | POA: Diagnosis not present

## 2017-08-26 DIAGNOSIS — M1712 Unilateral primary osteoarthritis, left knee: Secondary | ICD-10-CM | POA: Diagnosis not present

## 2017-09-10 ENCOUNTER — Encounter: Payer: Self-pay | Admitting: Internal Medicine

## 2017-09-10 ENCOUNTER — Ambulatory Visit (INDEPENDENT_AMBULATORY_CARE_PROVIDER_SITE_OTHER): Payer: Managed Care, Other (non HMO) | Admitting: Internal Medicine

## 2017-09-10 VITALS — BP 132/78 | HR 78 | Temp 98.8°F | Resp 16 | Ht 67.2 in | Wt 254.5 lb

## 2017-09-10 DIAGNOSIS — E785 Hyperlipidemia, unspecified: Secondary | ICD-10-CM

## 2017-09-10 DIAGNOSIS — E1142 Type 2 diabetes mellitus with diabetic polyneuropathy: Secondary | ICD-10-CM | POA: Diagnosis not present

## 2017-09-10 LAB — POCT GLYCOSYLATED HEMOGLOBIN (HGB A1C): HEMOGLOBIN A1C: 9.9

## 2017-09-10 MED ORDER — INSULIN DEGLUDEC 100 UNIT/ML ~~LOC~~ SOPN: 50.0000 [IU] | PEN_INJECTOR | Freq: Every day | SUBCUTANEOUS | 5 refills | Status: DC

## 2017-09-10 NOTE — Patient Instructions (Addendum)
Please change: - From Levemir to [place] 50 units daily  Please continue: - Humulin R insulin:  20-24 units before b'fast  15--20 units before lunch 15--20 units before dinner If you plan to be more active after a meal, please reduce the insulin before that meal by 5 units.  Please let me know if you want to start Jardiance.  Please come back for a follow-up appointment in 3-4 months.

## 2017-09-10 NOTE — Progress Notes (Signed)
Patient ID: Alexa Woods, female   DOB: 08-Mar-1961, 57 y.o.   MRN: [id number]  HPI: Alexa Woods is a 57 y.o.-year-old female, returning for f/u for DM2 dx 1997, insulin-dependent since 2010, uncontrolled, with complications (gastroparesis - dx 2012, PN). Last visit 3 months ago  Since last visit, she had URIs and also had a steroid inj in 07/2017 >> sugars higher.  Her L knee is still swollen and painful. She was advised not to bear weight on it. She needs a knee replacement.  Was out of insulin for 2 days few days ago.  Last hemoglobin A1c was: Lab Results  Component Value Date   HGBA1C 9.7 06/11/2017   HGBA1C 10.3 01/21/2017   HGBA1C 10.0 10/05/2016  10/05/2016: HbA1c calculated from fructosamine: 8.78% (higher, but better than the one measured) 07/09/2016: HbA1c calculated from the fructosamine is much better, 6.5%.  10/01/2015: HbA1c calculated from fructosamine is 8.9%.  She is on - Levemir 50 units at night  - Humulin R insulin: - Mealtime insulin:  20-24 units before b'fast  15--20 units before lunch 15 units before dinner -  Could not tolerate Metformin >> GI upset. (N+V) Tried Januvia >> nausea. Did not want to start Jardiance due to possible side effects  Pt checks her sugars mostly once a day: - am: 11-214, 288, 498 >> 76, 127-334 (may forget Levemir) - after b'fast: 151-219 >> 244-360 >> n/c >> 339 - before lunch: 220-240 >> 208, 218 >> n/c >> 193 - after lunch: 163-227, 326 >> n/c >> 262 - before dinner: n/c >> 111-328 >> n/c >> 63 - after dinner:99, 102 >> 200s >> 202 >> Burns >> 61-342 - bedtime: 173, 215 >> n/c >> n/c Lowest sugar was  79 (before dinner) x1 >> 50 (more active) >> 61; she has hypoglycemia awareness in the 90s. Highest sugar was 420 >> 498 >> 300s  Pt's meals are: - Breakfast: bowl of cereal (rice krispies, lucky charms) with milk 2% - Lunch: PB sandwich - Dinner: pork chop + rice/potatoes + green beans  - Snacks: no; smtms apples or  bananas She is limited in what she can eat due to her gastroparesis.  -No CKD, last BUN/creatinine:  Lab Results  Component Value Date   BUN 15 11/09/2016   CREATININE 1.01 11/09/2016  Not on ACE inhibitors:  Last ACR normal: Lab Results  Component Value Date   MICRALBCREAT 0.4 03/08/2013   MICRALBCREAT 0.3 04/19/2012   -+ Hyperlipidemia; Last set of lipids: Lab Results  Component Value Date   CHOL 187 11/09/2016   HDL 66.50 11/09/2016   LDLCALC 98 11/09/2016   LDLDIRECT 126.8 09/23/2012   TRIG 116.0 11/09/2016   CHOLHDL 3 11/09/2016  Not on a statin. - last eye exam was in ~2009: No DR. her eye exams are now be covered by the insurance. - + numbness and tingling in her feet.  She has a torn meniscus in her left knee >> had surgery in 04/2017 >> still a lot of pain. Got a steroid inj in 05/2017.  She continues to be very stressed as her husband drinks (but he is not violent).  ROS: Constitutional+ fatigue, + weight gain, + hot flashes Eyes: no blurry vision, no xerophthalmia ENT: + Sore throat, no nodules palpated in throat, no dysphagia, no odynophagia, no hoarseness Cardiovascular: no CP/no SOB/no palpitations/+ leg swelling Respiratory: + cough/no SOB/no wheezing Gastrointestinal: no N/no V/no D/+ C/no acid reflux Musculoskeletal: + muscle aches/+ joint aches Skin: no  rashes, no hair loss Neurological: no tremors/+ numbness/+ tingling/no dizziness, + HA  I reviewed pt's medications, allergies, PMH, social hx, family hx, and changes were documented in the history of present illness. Otherwise, unchanged from my initial visit note.  PE: BP 132/78 (BP Location: Left Arm, Patient Position: Sitting)   Pulse 78   Temp 98.8 F (37.1 C)   Resp 16   Ht 5' 7.2" (1.707 m)   Wt 254 lb 8 oz (115.4 kg)   BMI 39.62 kg/m  Body mass index is 39.62 kg/m. Wt Readings from Last 3 Encounters:  09/10/17 254 lb 8 oz (115.4 kg)  07/14/17 251 lb (113.9 kg)  06/11/17 248 lb  (112.5 kg)   Constitutional: overweight, in NAD Eyes: PERRLA, EOMI, no exophthalmos ENT: moist mucous membranes, no thyromegaly, no cervical lymphadenopathy Cardiovascular: RRR, No MRG Respiratory: CTA B Gastrointestinal: abdomen soft, NT, ND, BS+ Musculoskeletal: no deformities, strength intact in all 4 Skin: moist, warm,+ rash- dyshidrotic eczema on palms and soles Neurological: no tremor with outstretched hands, DTR normal in all 4  ASSESSMENT: 1. DM2, insulin-dependent, uncontrolled, with complications - gastroparesis - 07/2010 - At 120 minutes, the amount of tracer remaining in the stomach ~39% (nl <30%). Seen by Dr Sharlett Iles - recommended to start Domperidone a that time >> could not afford.  - PN  2. Obesity class 3 BMI Classification:  < 18.5 underweight   18.5-24.9 normal weight   25.0-29.9 overweight   30.0-34.9 class I obesity   35.0-39.9 class II obesity   ? 40.0 class III obesity   3. HL  PLAN:  1. Patient with long-standing, uncontrolled, diabetes, on basal-bolus insulin regimen.  At last visit, her sugars were a little better and weight was also better after she was mostly immobilized after her knee surgery and did not eat much.  She was dropping her sugars slightly if she was active, so I advised her to reduce the dose with the preceding meal by 5 planned to be active after a meal.  I advised her to check sugars later in the day also - At this visit, sugars are higher, and still very variable, with occasional lows in the 60s.  There is no pattern. - she mentions that she sometimes forgets the insulin doses and we discussed about writing down whenever she takes them - I also suggested to switch from Levemir to [place], since this is longer acting and less prone to lows - We discussed again about the possibility of adding Jardiance to help with both diabetes and weight loss she does have lower extremity swelling and I explained that it would help with this also.   She would like to think about it and let me know. - We discussed that she needs to have better DM control before a possible TKR Sx - I suggested:  Patient Instructions  Please change: - From Levemir to [place] 50 units daily  Please continue: - Humulin R insulin:  20-24 units before b'fast  15--20 units before lunch 15--20 units before dinner If you plan to be more active after a meal, please reduce the insulin before that meal by 5 units.  Please let me know if you want to start Jardiance.  Please come back for a follow-up appointment in 3-4 months.  - today, HbA1c is 9.9% (slightly higher) - continue checking sugars at different times of the day - check 3x a day, rotating checks >> Advised to check more sugars later in the day - advised  for yearly eye exams >> she is not UTD - Return to clinic in 3-4 mo with sugar log    2. Obesity class 3 -At the previous visit, we started Jardiance to help with both diabetes and her weight.  She did not start this as she was afraid of possible side effects.  Again we discussed about the fact that the side effects are really very rare and the risk-benefit ratio is significantly leaning towards benefit.  She was think about it and let me know.  3. HL -Reviewed latest lipid panel from 10/2016: LDL decreased -She is not on a statin  [name], MD PhD [facility]

## 2017-09-20 ENCOUNTER — Other Ambulatory Visit: Payer: Self-pay | Admitting: *Deleted

## 2017-09-20 MED ORDER — INSULIN PEN NEEDLE 32G X 4 MM MISC: 1.0000 | 3 refills | Status: DC

## 2017-09-23 DIAGNOSIS — M1712 Unilateral primary osteoarthritis, left knee: Secondary | ICD-10-CM | POA: Diagnosis not present

## 2017-09-30 ENCOUNTER — Telehealth: Payer: Self-pay | Admitting: Internal Medicine

## 2017-09-30 NOTE — Telephone Encounter (Signed)
Pt states the new medication tresiba is not helping her blood sugars at all. They are ranging almost 300 daily  283 today 270 and 182 yesterday  They are running higher than they used to be with the levemir please advise

## 2017-10-01 ENCOUNTER — Telehealth: Payer: Self-pay | Admitting: Family Medicine

## 2017-10-02 NOTE — Telephone Encounter (Signed)
Error

## 2017-10-04 NOTE — Telephone Encounter (Addendum)
Spoke to patient. Advised Dr. Cruzita Lederer not in the office today, will return tomorrow. Offered to have MD on duty advise, pt requested to wait until Dr. Darnell Level returns.   Pt requesting to go back to Levemir.

## 2017-10-05 MED ORDER — INSULIN DETEMIR 100 UNIT/ML ~~LOC~~ SOLN: 50.0000 [IU] | Freq: Every day | SUBCUTANEOUS | 4 refills | Status: DC

## 2017-10-05 NOTE — Addendum Note (Signed)
Addended by: Leia Alf on: 10/05/2017 08:38 AM   Modules accepted: Orders

## 2017-10-05 NOTE — Telephone Encounter (Signed)
OK to change - same dose

## 2017-10-05 NOTE — Telephone Encounter (Signed)
Done

## 2017-10-13 ENCOUNTER — Ambulatory Visit (INDEPENDENT_AMBULATORY_CARE_PROVIDER_SITE_OTHER): Payer: Managed Care, Other (non HMO) | Admitting: Family Medicine

## 2017-10-13 ENCOUNTER — Encounter: Payer: Self-pay | Admitting: Family Medicine

## 2017-10-13 VITALS — BP 112/76 | HR 77 | Ht 67.2 in | Wt 254.5 lb

## 2017-10-13 DIAGNOSIS — K649 Unspecified hemorrhoids: Secondary | ICD-10-CM | POA: Diagnosis not present

## 2017-10-13 DIAGNOSIS — K5904 Chronic idiopathic constipation: Secondary | ICD-10-CM

## 2017-10-13 DIAGNOSIS — R300 Dysuria: Secondary | ICD-10-CM

## 2017-10-13 LAB — POC URINALSYSI DIPSTICK (AUTOMATED)
BILIRUBIN UA: NEGATIVE
GLUCOSE UA: NEGATIVE
Ketones, UA: NEGATIVE
Leukocytes, UA: NEGATIVE
Nitrite, UA: NEGATIVE
Protein, UA: NEGATIVE
RBC UA: NEGATIVE
SPEC GRAV UA: 1.02 (ref 1.010–1.025)
Urobilinogen, UA: 0.2 E.U./dL
pH, UA: 6 (ref 5.0–8.0)

## 2017-10-13 MED ORDER — HYDROCORTISONE ACETATE 25 MG RE SUPP: 25.0000 mg | Freq: Two times a day (BID) | RECTAL | 1 refills | Status: DC

## 2017-10-13 NOTE — Patient Instructions (Addendum)
Can take over the counter AZO for burning with urination  Please take a dose of over the counter Miralax (generic is fine) every evening with your colace.    Constipation, Adult Constipation is when a person:  Poops (has a bowel movement) fewer times in a week than normal.  Has a hard time pooping.  Has poop that is dry, hard, or bigger than normal.  Follow these instructions at home: Eating and drinking   Eat foods that have a lot of fiber, such as: ? Fresh fruits and vegetables. ? Whole grains. ? Beans.  Eat less of foods that are high in fat, low in fiber, or overly processed, such as: ? [place] fries. ? Hamburgers. ? Cookies. ? Candy. ? Soda.  Drink enough fluid to keep your pee (urine) clear or pale yellow. General instructions  Exercise regularly or as told by your doctor.  Go to the restroom when you feel like you need to poop. Do not hold it in.  Take over-the-counter and prescription medicines only as told by your doctor. These include any fiber supplements.  Do pelvic floor retraining exercises, such as: ? Doing deep breathing while relaxing your lower belly (abdomen). ? Relaxing your pelvic floor while pooping.  Watch your condition for any changes.  Keep all follow-up visits as told by your doctor. This is important. Contact a doctor if:  You have pain that gets worse.  You have a fever.  You have not pooped for 4 days.  You throw up (vomit).  You are not hungry.  You lose weight.  You are bleeding from the anus.  You have thin, pencil-like poop (stool). Get help right away if:  You have a fever, and your symptoms suddenly get worse.  You leak poop or have blood in your poop.  Your belly feels hard or bigger than normal (is bloated).  You have very bad belly pain.  You feel dizzy or you faint. This information is not intended to replace advice given to you by your health care provider. Make sure you discuss any questions you have  with your health care provider. Document Released: 11/04/2007 Document Revised: 12/06/2015 Document Reviewed: 11/06/2015 Elsevier Interactive Patient Education  2018 [employer].

## 2017-10-13 NOTE — Progress Notes (Signed)
Subjective:    Patient ID: Alexa Woods, female    DOB: [birth year], 57 y.o.   MRN: [id number]  HPI This is a 57 yo female who presents today with dysuria x 3 days. Feels like she has to urinate but only small amount is coming out. No fever. Some left upper quadrant pain. Has chronic constipation and current hemorrhoids. Out of suppositories. Has bowel movement every day or every other day. Had large, hard BM yesterday. No fever or chills, no nausea or vomiting.  Blood sugars were out of control after change of insulin, has been in touch with Dr. Letta Median and is back on Levimir. Blood sugars are gradually coming down.   Past Medical History:  Diagnosis Date  . Alcohol abuse, unspecified   . Allergic rhinitis, cause unspecified   . Anxiety state, unspecified   . Backache, unspecified   . Depressive disorder, not elsewhere classified   . Dysphagia, unspecified(787.20)   . Edema   . Epigastric abdominal pain   . Esophageal reflux   . Gastroparesis   . GERD (gastroesophageal reflux disease)   . Herpes zoster without mention of complication   . History of Bell's palsy 4/09  . Hypopotassemia   . IBS (irritable bowel syndrome)   . Morbid obesity ([place])   . Myocardial infarction ([place])   . Other chronic cystitis   . Other psoriasis   . Other screening mammogram   . Pain in joint, lower leg   . Type II or unspecified type diabetes mellitus without mention of complication, not stated as uncontrolled   . Unspecified asthma(493.90)   . Unspecified essential hypertension   . Unspecified hemorrhoids without mention of complication   . Wrist fracture    Past Surgical History:  Procedure Laterality Date  . APPENDECTOMY    . CHOLECYSTECTOMY    . DOBUTAMINE STRESS ECHO  2009    normal stress echo, no evidence of ischemia  . ELBOW SURGERY Right    for nerve damage  . ESOPHAGOGASTRODUODENOSCOPY  07/2002   erythematous gastropathy  . KNEE ARTHROSCOPY W/ PARTIAL MEDIAL MENISCECTOMY Right  10/2008   and chondroplasty  . MRI  11/2102   pt states she has a bone spur on her spine  . PARTIAL HYSTERECTOMY    . SHOULDER SURGERY Left 05/2010   Dr. Alphonzo Severance  . TOTAL KNEE ARTHROPLASTY  2011   Dr. Ricki Rodriguez   Family History  Problem Relation Age of Onset  . Alcohol abuse Mother   . Hypertension Mother   . Esophageal cancer Maternal Grandmother   . Lung cancer Unknown        mat great uncle  . Colon cancer Neg Hx    Social History   Tobacco Use  . Smoking status: Former Smoker    Last attempt to quit: 12/30/2001    Years since quitting: 15.7  . Smokeless tobacco: Never Used  Substance Use Topics  . Alcohol use: No    Alcohol/week: 0.0 oz    Comment: former alcoholic   . Drug use: No     Review of Systems Per HPI    Objective:   Physical Exam  Constitutional: She is oriented to person, place, and time. She appears well-developed and well-nourished. No distress.  HENT:  Head: Normocephalic and atraumatic.  Eyes: Conjunctivae are normal.  Neck: Normal range of motion. Neck supple.  Cardiovascular: Normal rate and regular rhythm.  Pulmonary/Chest: Effort normal and breath sounds normal.  Abdominal: Soft. Bowel sounds are normal.  She exhibits no distension and no mass. There is tenderness (mild, generalized). There is no rebound, no guarding and no CVA tenderness.  Genitourinary:  Genitourinary Comments: She declined rectal exam.   Neurological: She is alert and oriented to person, place, and time.  Skin: Skin is warm and dry. She is not diaphoretic.  Psychiatric: She has a normal mood and affect. Her behavior is normal. Judgment and thought content normal.  Vitals reviewed.    BP 112/76 (BP Location: Left Arm, Patient Position: Sitting, Cuff Size: Large)   Pulse 77   Ht 5' 7.2" (1.707 m)   Wt 254 lb 8 oz (115.4 kg)   SpO2 96%   BMI 39.62 kg/m  Wt Readings from Last 3 Encounters:  10/13/17 254 lb 8 oz (115.4 kg)  09/10/17 254 lb 8 oz (115.4 kg)  07/14/17  251 lb (113.9 kg)    Results for orders placed or performed in visit on 10/13/17  POCT Urinalysis Dipstick (Automated)  Result Value Ref Range   Color, UA yellow    Clarity, UA clear    Glucose, UA neg    Bilirubin, UA neg    Ketones, UA neg    Spec Grav, UA 1.020 1.010 - 1.025   Blood, UA neg    pH, UA 6.0 5.0 - 8.0   Protein, UA neg    Urobilinogen, UA 0.2 0.2 or 1.0 E.U./dL   Nitrite, UA neg    Leukocytes, UA Negative Negative       Assessment & Plan:  1. Dysuria - urinalysis clear, encouraged increased fluids, can use AZO, will send culture - POCT Urinalysis Dipstick (Automated) - Urine Culture  2. Hemorrhoids, unspecified hemorrhoid type - provided information about constipation and discussed using Mira lax prn, Colace - hydrocortisone (ANUSOL-HC) 25 MG suppository; Place 1 suppository (25 mg total) rectally 2 (two) times daily.  Dispense: 12 suppository; Refill: 1  3. Chronic idiopathic constipation - Provided written and verbal information regarding diagnosis and treatment.    Clarene Reamer, FNP-BC  [facility]  10/14/2017 6:20 AM

## 2017-10-14 LAB — URINE CULTURE
MICRO NUMBER: 90592219
SPECIMEN QUALITY:: ADEQUATE

## 2017-10-15 ENCOUNTER — Telehealth: Payer: Self-pay | Admitting: Family Medicine

## 2017-10-15 NOTE — Telephone Encounter (Signed)
Copied from [place] [id number]. Topic: General - Other >> Oct 15, 2017  3:24 PM Valla Leaver wrote: Reason for CRM: Elmyra Ricks, with [employer] calling to see if the fax sent on 05/14 and 05/15 for a knee support was received yet? Please call back to advise if it needs to be refaxed.

## 2017-10-15 NOTE — Telephone Encounter (Signed)
Form should be completed by her ortho that's treating her knee/ doing her surgery. Called [employer] and advise them to have from sent to Ortho

## 2017-10-20 ENCOUNTER — Ambulatory Visit (INDEPENDENT_AMBULATORY_CARE_PROVIDER_SITE_OTHER): Payer: Managed Care, Other (non HMO) | Admitting: Family Medicine

## 2017-10-20 ENCOUNTER — Encounter: Payer: Self-pay | Admitting: Family Medicine

## 2017-10-20 VITALS — BP 124/72 | HR 76 | Temp 98.0°F | Ht 67.0 in | Wt 253.0 lb

## 2017-10-20 DIAGNOSIS — Z01818 Encounter for other preprocedural examination: Secondary | ICD-10-CM

## 2017-10-20 DIAGNOSIS — I10 Essential (primary) hypertension: Secondary | ICD-10-CM | POA: Diagnosis not present

## 2017-10-20 DIAGNOSIS — J4520 Mild intermittent asthma, uncomplicated: Secondary | ICD-10-CM | POA: Diagnosis not present

## 2017-10-20 DIAGNOSIS — E1142 Type 2 diabetes mellitus with diabetic polyneuropathy: Secondary | ICD-10-CM | POA: Diagnosis not present

## 2017-10-20 NOTE — Patient Instructions (Signed)
Keep track of your blood sugar - if worse contact Dr Cruzita Lederer   Continue the Qvar - do not miss doses, so asthma is well controlled for surgery   Take care of yourself   I will do the clearance form for your surgery

## 2017-10-20 NOTE — Progress Notes (Signed)
Subjective:    Patient ID: Alexa Woods, female    DOB: November 25, 1960, 57 y.o.   MRN: [id number]  HPI Here for pre operative exam   Planning TKA- med and lat w/wo patella resurfacing Bad arthritis  Dr Maureen Ralphs   She is down about it   Surgery date 12/06/17  Wt Readings from Last 3 Encounters:  10/20/17 253 lb (114.8 kg)  10/13/17 254 lb 8 oz (115.4 kg)  09/10/17 254 lb 8 oz (115.4 kg)   39.63 kg/m   She has had multiple surgeries incl TKA before (on R) -it did improve   BP Readings from Last 3 Encounters:  10/20/17 124/72  10/13/17 112/76  09/10/17 132/78   Pulse Readings from Last 3 Encounters:  10/20/17 76  10/13/17 77  09/10/17 78    Codeine causes n/v and rash  Lidocaine- rash  Sulfa- rash  Tramadol- makes her anxious  Cefuroxime- nausea   Not on any blood thinners    Stable EKG in 11/18   Anesthesia -past hx of side effects/problems -so for last surgery they gave her a breathing treatment before her general anesthesia   Asthma has been acting up this year Sees pulmonary  Now on Qvar and it helps a lot -much improved   No heart issues   Diabetes has not been good --sees Dr Cruzita Lederer  Still working with medications  Lab Results  Component Value Date   HGBA1C 9.9 09/10/2017  back on levemir now - glucose is up and down     Patient Active Problem List   Diagnosis Date Noted  . Pre-operative exam 04/05/2017  . Class 3 obesity with serious comorbidity and body mass index (BMI) of 40.0 to 44.9 in adult 01/21/2017  . Dysuria 08/02/2015  . LUQ abdominal pain 07/19/2014  . Joint swelling 12/20/2013  . Leg pain, bilateral 11/28/2013  . Periodontal disease 02/03/2013  . Hyperlipidemia, mild 09/27/2012  . Gastritis and gastroduodenitis 08/02/2012  . Rash of hands 05/23/2012  . 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 DYSPHAGIA UNSPECIFIED 08/07/2010  . Low back pain 07/22/2010  . Morbid obesity ([place]) 03/30/2008  . PATELLO-FEMORAL SYNDROME  03/30/2008  . Type 2 diabetes mellitus with peripheral neuropathy ([place]) 11/30/2006  . ANXIETY 11/30/2006  . History of alcohol abuse 11/30/2006  . Depression with anxiety 11/30/2006  . Essential hypertension 11/30/2006  . HEMORRHOIDS 11/30/2006  . ALLERGIC RHINITIS 11/30/2006  . Asthma 11/30/2006  . GERD 11/30/2006  . CYSTITIS, CHRONIC 11/30/2006  . PSORIASIS 11/30/2006   Past Medical History:  Diagnosis Date  . Alcohol abuse, unspecified   . Allergic rhinitis, cause unspecified   . Anxiety state, unspecified   . Backache, unspecified   . Depressive disorder, not elsewhere classified   . Dysphagia, unspecified(787.20)   . Edema   . Epigastric abdominal pain   . Esophageal reflux   . Gastroparesis   . GERD (gastroesophageal reflux disease)   . Herpes zoster without mention of complication   . History of Bell's palsy 4/09  . Hypopotassemia   . IBS (irritable bowel syndrome)   . Morbid obesity ([place])   . Myocardial infarction ([place])   . Other chronic cystitis   . Other psoriasis   . Other screening mammogram   . Pain in joint, lower leg   . Type II or unspecified type diabetes mellitus without mention of complication, not stated as uncontrolled   . Unspecified asthma(493.90)   . Unspecified essential hypertension   . Unspecified  hemorrhoids without mention of complication   . Wrist fracture    Past Surgical History:  Procedure Laterality Date  . APPENDECTOMY    . CHOLECYSTECTOMY    . DOBUTAMINE STRESS ECHO  2009    normal stress echo, no evidence of ischemia  . ELBOW SURGERY Right    for nerve damage  . ESOPHAGOGASTRODUODENOSCOPY  07/2002   erythematous gastropathy  . KNEE ARTHROSCOPY W/ PARTIAL MEDIAL MENISCECTOMY Right 10/2008   and chondroplasty  . MRI  11/2102   pt states she has a bone spur on her spine  . PARTIAL HYSTERECTOMY    . SHOULDER SURGERY Left 05/2010   Dr. Alphonzo Severance  . TOTAL KNEE ARTHROPLASTY  2011   Dr. Ricki Rodriguez   Social History   Tobacco Use    . Smoking status: Former Smoker    Last attempt to quit: 12/30/2001    Years since quitting: 15.8  . Smokeless tobacco: Never Used  Substance Use Topics  . Alcohol use: No    Alcohol/week: 0.0 oz    Comment: former alcoholic   . Drug use: No   Family History  Problem Relation Age of Onset  . Alcohol abuse Mother   . Hypertension Mother   . Esophageal cancer Maternal Grandmother   . Lung cancer Unknown        mat great uncle  . Colon cancer Neg Hx    Allergies  Allergen Reactions  . Bupropion Hcl   . Cefuroxime Axetil Nausea Only  . Codeine     REACTION: nausea and vomiting, rash  . Lidocaine     REACTION: unknown  . Metformin     REACTION: GI  . Paroxetine     REACTION: doesn't agree  . Propoxyphene N-Acetaminophen     REACTION: wheezing  . Sulfonamide Derivatives     REACTION: rash  . Tramadol Hcl     REACTION: Causes Anxiety   Current Outpatient Medications on File Prior to Visit  Medication Sig Dispense Refill  . albuterol (PROVENTIL HFA;VENTOLIN HFA) 108 (90 Base) MCG/ACT inhaler Inhale 2 puffs into the lungs every 6 (six) hours as needed for wheezing or shortness of breath. 18 g 1  . beclomethasone (QVAR) 80 MCG/ACT inhaler Inhale 2 puffs 2 (two) times daily into the lungs. Rinse mouth after use. 1 Inhaler 5  . dicyclomine (BENTYL) 10 MG capsule Take 1 capsule (10 mg total) by mouth 3 (three) times daily as needed for spasms. 90 capsule 1  . fluticasone (FLONASE) 50 MCG/ACT nasal spray Place 2 sprays into both nostrils daily. 48 g 1  . GLOBAL INJECT EASE INSULIN SYR 31G X 5/16" 0.5 ML MISC     . glucose blood (ONE TOUCH ULTRA TEST) test strip Use to test blood sugar 4 times daily as instructed. 125 each 2  . hydrochlorothiazide (HYDRODIURIL) 25 MG tablet TAKE 1 TABLET BY MOUTH DAILY 30 tablet 5  . hydrocortisone (ANUSOL-HC) 25 MG suppository Place 1 suppository (25 mg total) rectally 2 (two) times daily. 12 suppository 1  . insulin detemir (LEVEMIR) 100 UNIT/ML  injection Inject 0.5 mLs (50 Units total) into the skin daily. 20 mL 4  . Insulin Pen Needle (BD PEN NEEDLE NANO U/F) 32G X 4 MM MISC 1 each by Does not apply route as directed. 100 each 3  . insulin regular (HUMULIN R) 100 units/mL injection Inject 0.14-0.24 mLs (14-24 Units total) into the skin 3 (three) times daily before meals. 50 mL 3  . Insulin Syringe-Needle U-100 (  GLOBAL INJECT EASE INSULIN SYR) 31G X 5/16" 0.5 ML MISC USE THREE TIMES PER DAY 200 each 5  . Lancets (ONETOUCH ULTRASOFT) lancets Use to test blood sugar 4 times daily as instructed. 125 each 2  . LORazepam (ATIVAN) 1 MG tablet Take 1 mg by mouth every 8 (eight) hours as needed for anxiety or sleep.     Marland Kitchen ondansetron (ZOFRAN ODT) 4 MG disintegrating tablet Take 1 tablet (4 mg total) by mouth every 6 (six) hours as needed for nausea or vomiting. 20 tablet 0  . oxyCODONE-acetaminophen (PERCOCET) 10-325 MG tablet     . pantoprazole (PROTONIX) 40 MG tablet Take 1 tablet (40 mg total) by mouth 2 (two) times daily. 180 tablet 3  . perphenazine (TRILAFON) 2 MG tablet Take 2 mg by mouth at bedtime.    . potassium chloride (K-DUR,KLOR-CON) 10 MEQ tablet Take 1 tablet (10 mEq total) by mouth daily. 90 tablet 1  . sertraline (ZOLOFT) 100 MG tablet Take 200 mg by mouth daily.     Marland Kitchen tiZANidine (ZANAFLEX) 4 MG tablet Take 4 mg by mouth 2 (two) times daily.     Marland Kitchen zolpidem (AMBIEN) 10 MG tablet Take 15 mg by mouth at bedtime as needed for sleep.     . [DISCONTINUED] potassium chloride (KLOR-CON) 10 MEQ CR tablet Take 1 tablet (10 mEq total) by mouth daily. 60 tablet 6   No current facility-administered medications on file prior to visit.     Review of Systems  Constitutional: Negative for activity change, appetite change, fatigue, fever and unexpected weight change.  HENT: Negative for congestion, rhinorrhea, sore throat and trouble swallowing.   Eyes: Negative for pain, redness, itching and visual disturbance.  Respiratory: Negative for  cough, chest tightness, shortness of breath and wheezing.   Cardiovascular: Negative for chest pain and palpitations.  Gastrointestinal: Negative for abdominal pain, blood in stool, constipation, diarrhea and nausea.  Endocrine: Negative for cold intolerance, heat intolerance, polydipsia and polyuria.  Genitourinary: Negative for difficulty urinating, dysuria, frequency and urgency.  Musculoskeletal: Positive for arthralgias. Negative for joint swelling and myalgias.       Knee pain  Skin: Negative for pallor and rash.  Neurological: Negative for dizziness, tremors, weakness, numbness and headaches.  Hematological: Negative for adenopathy. Does not bruise/bleed easily.  Psychiatric/Behavioral: Positive for dysphoric mood. Negative for decreased concentration. The patient is not nervous/anxious.        Grief- her dog died yesterday        Objective:   Physical Exam  Constitutional: She appears well-developed and well-nourished. No distress.  obese and well appearing   HENT:  Head: Normocephalic and atraumatic.  Mouth/Throat: Oropharynx is clear and moist.  Eyes: Pupils are equal, round, and reactive to light. Conjunctivae and EOM are normal.  Neck: Normal range of motion. Neck supple. No JVD present. Carotid bruit is not present. No thyromegaly present.  Cardiovascular: Normal rate, regular rhythm, normal heart sounds and intact distal pulses. Exam reveals no gallop.  Pulmonary/Chest: Effort normal and breath sounds normal. No respiratory distress. She has no wheezes. She has no rales.  No crackle  Good air exch  No wheeze even on forced expiration   Abdominal: Soft. Bowel sounds are normal. She exhibits no distension, no abdominal bruit and no mass. There is tenderness. There is no rebound and no guarding. No hernia.  Baseline suprapubic tenderness    Musculoskeletal: She exhibits no edema.  Lymphadenopathy:    She has no cervical adenopathy.  Neurological:  She is alert. She has  normal reflexes. She displays normal reflexes. No cranial nerve deficit or sensory deficit. She exhibits normal muscle tone. Coordination normal.  Skin: Skin is warm and dry. No rash noted.  Psychiatric: She has a normal mood and affect.  Sad today- disc loss of her dog yesterday           Assessment & Plan:   Problem List Items Addressed This Visit      Cardiovascular and Mediastinum   Essential hypertension    bp in fair control at this time  BP Readings from Last 1 Encounters:  10/20/17 124/72   No changes needed Most recent labs reviewed  Disc lifstyle change with low sodium diet and exercise          Respiratory   Asthma    Much improvement after start of Qvar by pulmonary which she uses bid  Enc her to stay compliant with this esp before her knee surgery  For surgery-pt req albuterol inhaler pre op if getting general anesthesia         Endocrine   Type 2 diabetes mellitus with peripheral neuropathy ([place])    Continue f/u with Dr Cruzita Lederer  Not optimally controlled Enc DM diet         Other   Pre-operative exam - Primary    Getting ready for TKA (has had one in the past)  Rev med intolerances and allergies HTN in control  DM not optimal- recommend following closely during her hosp and rehab  Asthma is now better controlled with Q var  If general anesthesia recommend albuterol mdi or NMT first  Medically cleared for surgery

## 2017-10-21 NOTE — Assessment & Plan Note (Signed)
Much improvement after start of Qvar by pulmonary which she uses bid  Enc her to stay compliant with this esp before her knee surgery  For surgery-pt req albuterol inhaler pre op if getting general anesthesia

## 2017-10-21 NOTE — Assessment & Plan Note (Signed)
bp in fair control at this time  BP Readings from Last 1 Encounters:  10/20/17 124/72   No changes needed Most recent labs reviewed  Disc lifstyle change with low sodium diet and exercise

## 2017-10-21 NOTE — Assessment & Plan Note (Signed)
Getting ready for TKA (has had one in the past)  Rev med intolerances and allergies HTN in control  DM not optimal- recommend following closely during her hosp and rehab  Asthma is now better controlled with Q var  If general anesthesia recommend albuterol mdi or NMT first  Medically cleared for surgery

## 2017-10-21 NOTE — Assessment & Plan Note (Signed)
Continue f/u with Dr Cruzita Lederer  Not optimally controlled Enc DM diet

## 2017-11-11 ENCOUNTER — Encounter (HOSPITAL_COMMUNITY): Payer: Self-pay | Admitting: *Deleted

## 2017-11-16 ENCOUNTER — Other Ambulatory Visit: Payer: Self-pay | Admitting: Family Medicine

## 2017-11-19 DIAGNOSIS — M542 Cervicalgia: Secondary | ICD-10-CM | POA: Diagnosis not present

## 2017-11-19 DIAGNOSIS — M545 Low back pain: Secondary | ICD-10-CM | POA: Diagnosis not present

## 2017-11-25 NOTE — H&P (Signed)
TOTAL KNEE ADMISSION H&P  Patient is being admitted for left total knee arthroplasty.  Subjective:  Chief Complaint:left knee pain.  HPI: Alexa Woods, 57 y.o. female, has a history of pain and functional disability in the left knee due to arthritis and has failed non-surgical conservative treatments for greater than 12 weeks to includeNSAID's and/or analgesics, corticosteriod injections and activity modification.  Onset of symptoms was gradual, starting 3+ years ago with gradually worsening course since that time. The patient noted prior procedures on the knee to include  arthroscopy and menisectomy on the left knee(s).  Patient currently rates pain in the left knee(s) at 7 out of 10 with activity at  times. Patient has night pain, worsening of pain with activity and weight bearing, pain that interferes with activities of daily living and joint swelling.  Patient has evidence of MRI shows degenerative changes medial and patellofemoral compartments by imaging studies. This patient has had increasing pain after knee scope. There is no active infection.  Patient Active Problem List   Diagnosis Date Noted  . Pre-operative exam 04/05/2017  . Class 3 obesity with serious comorbidity and body mass index (BMI) of 40.0 to 44.9 in adult 01/21/2017  . Dysuria 08/02/2015  . LUQ abdominal pain 07/19/2014  . Joint swelling 12/20/2013  . Leg pain, bilateral 11/28/2013  . Periodontal disease 02/03/2013  . Hyperlipidemia, mild 09/27/2012  . Rash of hands 05/23/2012  . 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 DYSPHAGIA UNSPECIFIED 08/07/2010  . Low back pain 07/22/2010  . Morbid obesity ([place]) 03/30/2008  . 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  . Type 2 diabetes mellitus with peripheral neuropathy ([place]) 11/30/2006  . ANXIETY 11/30/2006  . History of alcohol abuse 11/30/2006  . Depression with anxiety 11/30/2006  . Essential hypertension 11/30/2006  . HEMORRHOIDS 11/30/2006  . ALLERGIC  RHINITIS 11/30/2006  . Asthma 11/30/2006  . GERD 11/30/2006  . CYSTITIS, CHRONIC 11/30/2006  . PSORIASIS 11/30/2006   Past Medical History:  Diagnosis Date  . Alcohol abuse, unspecified   . Allergic rhinitis, cause unspecified   . Anxiety state, unspecified   . Backache, unspecified   . Depressive disorder, not elsewhere classified   . Dysphagia, unspecified(787.20)   . Edema   . Epigastric abdominal pain   . Esophageal reflux   . Gastroparesis   . GERD (gastroesophageal reflux disease)   . Herpes zoster without mention of complication   . History of Bell's palsy 4/09  . Hypopotassemia   . IBS (irritable bowel syndrome)   . Morbid obesity ([place])   . Myocardial infarction ([place])   . Other chronic cystitis   . Other psoriasis   . Other screening mammogram   . Pain in joint, lower leg   . Type II or unspecified type diabetes mellitus without mention of complication, not stated as uncontrolled   . Unspecified asthma(493.90)   . Unspecified essential hypertension   . Unspecified hemorrhoids without mention of complication   . Wrist fracture     Past Surgical History:  Procedure Laterality Date  . APPENDECTOMY    . CHOLECYSTECTOMY    . DOBUTAMINE STRESS ECHO  2009    normal stress echo, no evidence of ischemia  . ELBOW SURGERY Right    for nerve damage  . ESOPHAGOGASTRODUODENOSCOPY  07/2002   erythematous gastropathy  . KNEE ARTHROSCOPY W/ PARTIAL MEDIAL MENISCECTOMY Right 10/2008   and chondroplasty  . MRI  11/2102   pt states she has a bone spur on her spine  .  PARTIAL HYSTERECTOMY    . SHOULDER SURGERY Left 05/2010   Dr. Alphonzo Severance  . TOTAL KNEE ARTHROPLASTY  2011   Dr. Ricki Rodriguez    No current facility-administered medications for this encounter.    Current Outpatient Medications  Medication Sig Dispense Refill Last Dose  . albuterol (PROVENTIL HFA;VENTOLIN HFA) 108 (90 Base) MCG/ACT inhaler Inhale 2 puffs into the lungs every 6 (six) hours as needed for wheezing or  shortness of breath. 18 g 1 Taking  . beclomethasone (QVAR) 80 MCG/ACT inhaler Inhale 2 puffs 2 (two) times daily into the lungs. Rinse mouth after use. 1 Inhaler 5 Taking  . docusate sodium (COLACE) 100 MG capsule Take 100 mg by mouth daily.     . hydrochlorothiazide (HYDRODIURIL) 25 MG tablet TAKE 1 TABLET BY MOUTH DAILY 90 tablet 1   . insulin detemir (LEVEMIR) 100 UNIT/ML injection Inject 0.5 mLs (50 Units total) into the skin daily. 20 mL 4 Taking  . insulin regular (HUMULIN R) 100 units/mL injection Inject 0.14-0.24 mLs (14-24 Units total) into the skin 3 (three) times daily before meals. (Patient taking differently: Inject 20 Units into the skin 3 (three) times daily before meals. 20 units before breakfast, 15 units before lunch, 15 units before the evening meal.) 50 mL 3 Taking  . LORazepam (ATIVAN) 1 MG tablet Take 1 mg by mouth 4 (four) times daily as needed for anxiety or sleep.    Taking  . ondansetron (ZOFRAN ODT) 4 MG disintegrating tablet Take 1 tablet (4 mg total) by mouth every 6 (six) hours as needed for nausea or vomiting. 20 tablet 0 Taking  . oxyCODONE-acetaminophen (PERCOCET) 10-325 MG tablet Take 1 tablet by mouth 2 (two) times daily.    Taking  . pantoprazole (PROTONIX) 40 MG tablet Take 1 tablet (40 mg total) by mouth 2 (two) times daily. (Patient taking differently: Take 40 mg by mouth daily. ) 180 tablet 3 Taking  . perphenazine (TRILAFON) 2 MG tablet Take 2 mg by mouth at bedtime.   Taking  . potassium chloride (K-DUR,KLOR-CON) 10 MEQ tablet Take 1 tablet (10 mEq total) by mouth daily. 90 tablet 1 Taking  . sertraline (ZOLOFT) 100 MG tablet Take 200 mg by mouth daily.    Taking  . tiZANidine (ZANAFLEX) 4 MG tablet Take 4 mg by mouth 2 (two) times daily.    Taking  . zolpidem (AMBIEN) 10 MG tablet Take 15 mg by mouth at bedtime.    Taking  . fluticasone (FLONASE) 50 MCG/ACT nasal spray Place 2 sprays into both nostrils daily. (Patient not taking: Reported on 11/24/2017) 48 g  1 Not Taking at Unknown time  . GLOBAL INJECT EASE INSULIN SYR 31G X 5/16" 0.5 ML MISC    Taking  . glucose blood (ONE TOUCH ULTRA TEST) test strip Use to test blood sugar 4 times daily as instructed. 125 each 2 Taking  . hydrocortisone (ANUSOL-HC) 25 MG suppository Place 1 suppository (25 mg total) rectally 2 (two) times daily. (Patient not taking: Reported on 11/24/2017) 12 suppository 1 Not Taking at Unknown time  . Insulin Pen Needle (BD PEN NEEDLE NANO U/F) 32G X 4 MM MISC 1 each by Does not apply route as directed. 100 each 3 Taking  . Insulin Syringe-Needle U-100 (GLOBAL INJECT EASE INSULIN SYR) 31G X 5/16" 0.5 ML MISC USE THREE TIMES PER DAY 200 each 5 Taking  . Lancets (ONETOUCH ULTRASOFT) lancets Use to test blood sugar 4 times daily as instructed. 125 each 2 Taking  Allergies  Allergen Reactions  . Bupropion Hcl Other (See Comments)    Pt is unsure  . Cefuroxime Axetil Nausea Only  . Codeine     REACTION: nausea and vomiting, rash  . Lidocaine     REACTION: unknown  . Metformin     REACTION: GI  . Paroxetine     REACTION: doesn't agree  . Propoxyphene N-Acetaminophen     REACTION: wheezing  . Sulfonamide Derivatives     REACTION: rash  . Tramadol Hcl     REACTION: Causes Anxiety    Social History   Tobacco Use  . Smoking status: Former Smoker    Last attempt to quit: 12/30/2001    Years since quitting: 15.9  . Smokeless tobacco: Never Used  Substance Use Topics  . Alcohol use: No    Alcohol/week: 0.0 oz    Comment: former alcoholic     Family History  Problem Relation Age of Onset  . Alcohol abuse Mother   . Hypertension Mother   . Esophageal cancer Maternal Grandmother   . Lung cancer Unknown        mat great uncle  . Colon cancer Neg Hx      Review of Systems  Constitutional: Negative for chills and fever.  HENT: Negative.   Respiratory: Negative for cough, hemoptysis, shortness of breath and wheezing.   Cardiovascular: Negative for chest pain and  palpitations.  Gastrointestinal: Positive for constipation.  Genitourinary:       Small amounts incontinence  Musculoskeletal: Positive for joint pain.  Skin: Negative for itching and rash.  Neurological: Positive for weakness.    Objective:  Physical Exam  Constitutional: She is oriented to person, place, and time. She appears well-developed and well-nourished.  HENT:  Head: Normocephalic and atraumatic.  Eyes: Pupils are equal, round, and reactive to light. EOM are normal.  Neck: Normal range of motion. Neck supple.  Cardiovascular: Normal rate, regular rhythm, normal heart sounds and intact distal pulses.  Respiratory: Effort normal and breath sounds normal. She has no wheezes.  GI: Soft. Bowel sounds are normal. There is no tenderness.  Musculoskeletal:       Left knee: She exhibits swelling and effusion. Tenderness found. Medial joint line and lateral joint line tenderness noted.  ROM Lknee 1-125 No crepitus  Neurological: She is alert and oriented to person, place, and time. She has normal reflexes.  Decreased sensation bi lower extremities; motors intact  Skin: Skin is warm and dry.  Psychiatric: She has a normal mood and affect. Her behavior is normal. Thought content normal.    Vital signs in last 24 hours: BP: ()/()  Arterial Line BP: ()/()   Labs:   Estimated body mass index is 39.63 kg/m as calculated from the following:   Height as of 10/20/17: 5\' 7"  (1.702 m).   Weight as of 10/20/17: 114.8 kg (253 lb).   Imaging Review Plain radiographs demonstrate severe degenerative joint disease of the left knee(s). The overall alignment ismild varus. The bone quality appears to be adequate for age and reported activity level.   Preoperative templating of the joint replacement has been completed, documented, and submitted to the Operating Room personnel in order to optimize intra-operative equipment management.   Anticipated LOS equal to or greater than 2 midnights  due to - Age 77 and older with one or more of the following:  - Obesity  - Expected need for hospital services (PT, OT, Nursing) required for safe  discharge  - Anticipated  need for postoperative skilled nursing care or inpatient rehab  - Active co-morbidities: Chronic pain requiring opiods OR         Assessment/Plan:  End stage arthritis, left knee   The patient history, physical examination, clinical judgment of the provider and imaging studies are consistent with end stage degenerative joint disease of the left knee(s) and total knee arthroplasty is deemed medically necessary. The treatment options including medical management, injection therapy arthroscopy and arthroplasty were discussed at length. The risks and benefits of total knee arthroplasty were presented and reviewed. The risks due to aseptic loosening, infection, stiffness, patella tracking problems, thromboembolic complications and other imponderables were discussed. The patient acknowledged the explanation, agreed to proceed with the plan and consent was signed. Patient is being admitted for inpatient treatment for surgery, pain control, PT, OT, prophylactic antibiotics, VTE prophylaxis, progressive ambulation and ADL's and discharge planning. The patient is planning to be discharged to skilled nursing facility for post operative care.Prefers [facility]  Allergies: Allergies  Allergen Reactions  . Bupropion Hcl Other (See Comments)    Pt is unsure  . Cefuroxime Axetil Nausea Only  . Codeine     REACTION: nausea and vomiting, rash  . Lidocaine     REACTION: unknown  . Metformin     REACTION: GI  . Paroxetine     REACTION: doesn't agree  . Propoxyphene N-Acetaminophen     REACTION: wheezing  . Sulfonamide Derivatives     REACTION: rash  . Tramadol Hcl     REACTION: Causes Anxiety      Patient was instructed on what medications to stop prior to surgery -Follow up visit 2 weeks post op with Dr.  Wynelle Link -Begin physical therapy following surgery with HHPT -Pre-operative lab work as per Pre-Surgical testing -Prescriptions will be provided at time of discharge in hospital or as per her chronic pain treatment

## 2017-11-29 ENCOUNTER — Encounter (HOSPITAL_COMMUNITY): Payer: Self-pay

## 2017-11-29 NOTE — Patient Instructions (Addendum)
Alexa Woods  11/29/2017   Your procedure is scheduled on:  12-06-17  Report to [facility] Main  Entrance  Report to admitting at  10:15 AM    Call this number if you have problems the morning of surgery [phone]   Remember: Do not eat food After Midnight. May have clear liquid diet from midnight until 6:30 AM , after 6:30 AM nothing by mouth.     CLEAR LIQUID DIET   Foods Allowed                                                                     Foods Excluded  Coffee and tea, regular and decaf                             liquids that you cannot  Plain Jell-O in any flavor                                             see through such as: Fruit ices (not with fruit pulp)                                     milk, soups, orange juice  Iced Popsicles                                    All solid food Carbonated beverages, regular and diet                                    Cranberry, grape and apple juices Sports drinks like Gatorade Lightly seasoned clear broth or consume(fat free) Sugar, honey syrup  Sample Menu Breakfast                                Lunch                                     Supper Cranberry juice                    Beef broth                            Chicken broth Jell-O                                     Grape juice                           Apple juice Coffee or  tea                        Jell-O                                      Popsicle                                                Coffee or tea                        Coffee or tea  _____________________________________________________________________     Take these medicines the morning of surgery with A SIP OF WATER: OXYCODONE , QVAR INHALER, ZANAFLEX, PROTONIX:  IF NEEDED TAKE ATIVAN, DO ALBUTEROL INHALER IF NEEDED AND BRING INHALER WITH YOU DAY OF SURGERY  DIABETIC MEDICATION INSTRUCTIONS:  TAKE YOUR LEVEMIR INSULIN AS USUAL NIGHT BEFORE SURGERY  TAKE 1/2 OF YOUR  SLIDING SCALE HUMULIN R IF YOUR BLOOD SUGAR IS GREATER THAN 220 MORNING OF SURGERY 12-06-17                        You may not have any metal on your body including hair pins and              piercings  Do not wear jewelry, make-up, lotions, powders or perfumes, deodorant             Do not wear nail polish.  Do not shave  48 hours prior to surgery.     Do not bring valuables to the hospital. [place].  Contacts, dentures or bridgework may not be worn into surgery.  Leave suitcase in the car. After surgery it may be brought to your room.                 Please read over the following fact sheets you were given: _____________________________________________________________________             [facility] - Preparing for Surgery Before surgery, you can play an important role.  Because skin is not sterile, your skin needs to be as free of germs as possible.  You can reduce the number of germs on your skin by washing with CHG (chlorahexidine gluconate) soap before surgery.  CHG is an antiseptic cleaner which kills germs and bonds with the skin to continue killing germs even after washing. Please DO NOT use if you have an allergy to CHG or antibacterial soaps.  If your skin becomes reddened/irritated stop using the CHG and inform your nurse when you arrive at Short Stay. Do not shave (including legs and underarms) for at least 48 hours prior to the first CHG shower.  You may shave your face/neck. Please follow these instructions carefully:  1.  Shower with CHG Soap the night before surgery and the  morning of Surgery.  2.  If you choose to wash your hair, wash your hair first as usual with your  normal  shampoo.  3.  After you shampoo, rinse your hair and body thoroughly to remove the  shampoo.  4.  Use CHG as you would any other liquid soap.  You can apply chg directly  to the skin and wash                        Gently with a scrungie or clean washcloth.  5.  Apply the CHG Soap to your body ONLY FROM THE NECK DOWN.   Do not use on face/ open                           Wound or open sores. Avoid contact with eyes, ears mouth and genitals (private parts).                       Wash face,  Genitals (private parts) with your normal soap.             6.  Wash thoroughly, paying special attention to the area where your surgery  will be performed.  7.  Thoroughly rinse your body with warm water from the neck down.  8.  DO NOT shower/wash with your normal soap after using and rinsing off  the CHG Soap.              9.  Pat yourself dry with a clean towel.            10.  Wear clean pajamas.            11.  Place clean sheets on your bed the night of your first shower and do not  sleep with pets. Day of Surgery : Do not apply any lotions/deodorants the morning of surgery.  Please wear clean clothes to the hospital/surgery center.  FAILURE TO FOLLOW THESE INSTRUCTIONS MAY RESULT IN THE CANCELLATION OF YOUR SURGERY PATIENT SIGNATURE_________________________________  NURSE SIGNATURE__________________________________  ________________________________________________________________________   Adam Phenix  An incentive spirometer is a tool that can help keep your lungs clear and active. This tool measures how well you are filling your lungs with each breath. Taking long deep breaths may help reverse or decrease the chance of developing breathing (pulmonary) problems (especially infection) following:  A long period of time when you are unable to move or be active. BEFORE THE PROCEDURE   If the spirometer includes an indicator to show your best effort, your nurse or respiratory therapist will set it to a desired goal.  If possible, sit up straight or lean slightly forward. Try not to slouch.  Hold the incentive spirometer in an upright position. INSTRUCTIONS FOR USE  1. Sit on the edge of your  bed if possible, or sit up as far as you can in bed or on a chair. 2. Hold the incentive spirometer in an upright position. 3. Breathe out normally. 4. Place the mouthpiece in your mouth and seal your lips tightly around it. 5. Breathe in slowly and as deeply as possible, raising the piston or the ball toward the top of the column. 6. Hold your breath for 3-5 seconds or for as long as possible. Allow the piston or ball to fall to the bottom of the column. 7. Remove the mouthpiece from your mouth and breathe out normally. 8. Rest for a few seconds and repeat Steps 1 through 7 at least 10 times every 1-2 hours when you are awake. Take your time and take a few normal breaths between deep breaths. 9. The spirometer may include an indicator to show your  best effort. Use the indicator as a goal to work toward during each repetition. 10. After each set of 10 deep breaths, practice coughing to be sure your lungs are clear. If you have an incision (the cut made at the time of surgery), support your incision when coughing by placing a pillow or rolled up towels firmly against it. Once you are able to get out of bed, walk around indoors and cough well. You may stop using the incentive spirometer when instructed by your caregiver.  RISKS AND COMPLICATIONS  Take your time so you do not get dizzy or light-headed.  If you are in pain, you may need to take or ask for pain medication before doing incentive spirometry. It is harder to take a deep breath if you are having pain. AFTER USE  Rest and breathe slowly and easily.  It can be helpful to keep track of a log of your progress. Your caregiver can provide you with a simple table to help with this. If you are using the spirometer at home, follow these instructions: [place] IF:   You are having difficultly using the spirometer.  You have trouble using the spirometer as often as instructed.  Your pain medication is not giving enough relief while  using the spirometer.  You develop fever of 100.5 F (38.1 C) or higher. SEEK IMMEDIATE MEDICAL CARE IF:   You cough up bloody sputum that had not been present before.  You develop fever of 102 F (38.9 C) or greater.  You develop worsening pain at or near the incision site. MAKE SURE YOU:   Understand these instructions.  Will watch your condition.  Will get help right away if you are not doing well or get worse. Document Released: 09/28/2006 Document Revised: 08/10/2011 Document Reviewed: 11/29/2006 Exit[employer] Patient Information 2014 [employer], [place].   ________________________________________________________________________  WHAT IS A BLOOD TRANSFUSION? Blood Transfusion Information  A transfusion is the replacement of blood or some of its parts. Blood is made up of multiple cells which provide different functions.  Red blood cells carry oxygen and are used for blood loss replacement.  White blood cells fight against infection.  Platelets control bleeding.  Plasma helps clot blood.  Other blood products are available for specialized needs, such as hemophilia or other clotting disorders. BEFORE THE TRANSFUSION  Who gives blood for transfusions?   Healthy volunteers who are fully evaluated to make sure their blood is safe. This is blood bank blood. Transfusion therapy is the safest it has ever been in the practice of medicine. Before blood is taken from a donor, a complete history is taken to make sure that person has no history of diseases nor engages in risky social behavior (examples are intravenous drug use or sexual activity with multiple partners). The donor's travel history is screened to minimize risk of transmitting infections, such as malaria. The donated blood is tested for signs of infectious diseases, such as HIV and hepatitis. The blood is then tested to be sure it is compatible with you in order to minimize the chance of a transfusion reaction. If you or a  relative donates blood, this is often done in anticipation of surgery and is not appropriate for emergency situations. It takes many days to process the donated blood. RISKS AND COMPLICATIONS Although transfusion therapy is very safe and saves many lives, the main dangers of transfusion include:   Getting an infectious disease.  Developing a transfusion reaction. This is an allergic reaction to something  in the blood you were given. Every precaution is taken to prevent this. The decision to have a blood transfusion has been considered carefully by your caregiver before blood is given. Blood is not given unless the benefits outweigh the risks. AFTER THE TRANSFUSION  Right after receiving a blood transfusion, you will usually feel much better and more energetic. This is especially true if your red blood cells have gotten low (anemic). The transfusion raises the level of the red blood cells which carry oxygen, and this usually causes an energy increase.  The nurse administering the transfusion will monitor you carefully for complications. HOME CARE INSTRUCTIONS  No special instructions are needed after a transfusion. You may find your energy is better. Speak with your caregiver about any limitations on activity for underlying diseases you may have. SEEK MEDICAL CARE IF:   Your condition is not improving after your transfusion.  You develop redness or irritation at the intravenous (IV) site. SEEK IMMEDIATE MEDICAL CARE IF:  Any of the following symptoms occur over the next 12 hours:  Shaking chills.  You have a temperature by mouth above 102 F (38.9 C), not controlled by medicine.  Chest, back, or muscle pain.  People around you feel you are not acting correctly or are confused.  Shortness of breath or difficulty breathing.  Dizziness and fainting.  You get a rash or develop hives.  You have a decrease in urine output.  Your urine turns a dark color or changes to pink, red, or  brown. Any of the following symptoms occur over the next 10 days:  You have a temperature by mouth above 102 F (38.9 C), not controlled by medicine.  Shortness of breath.  Weakness after normal activity.  The white part of the eye turns yellow (jaundice).  You have a decrease in the amount of urine or are urinating less often.  Your urine turns a dark color or changes to pink, red, or brown. Document Released: 05/15/2000 Document Revised: 08/10/2011 Document Reviewed: 01/02/2008 [facility] Patient Information 2014 [place], [place].  _______________________________________________________________________

## 2017-11-30 ENCOUNTER — Other Ambulatory Visit: Payer: Self-pay

## 2017-11-30 ENCOUNTER — Encounter (HOSPITAL_COMMUNITY): Payer: Self-pay

## 2017-11-30 ENCOUNTER — Encounter (HOSPITAL_COMMUNITY)
Admission: RE | Admit: 2017-11-30 | Discharge: 2017-11-30 | Disposition: A | Discharge status: Home / Self Care | Payer: Managed Care, Other (non HMO) | Source: Ambulatory Visit | Attending: Orthopedic Surgery | Admitting: Orthopedic Surgery

## 2017-11-30 DIAGNOSIS — Z0183 Encounter for blood typing: Secondary | ICD-10-CM | POA: Insufficient documentation

## 2017-11-30 DIAGNOSIS — M1712 Unilateral primary osteoarthritis, left knee: Secondary | ICD-10-CM | POA: Diagnosis not present

## 2017-11-30 DIAGNOSIS — Z01812 Encounter for preprocedural laboratory examination: Secondary | ICD-10-CM | POA: Diagnosis present

## 2017-11-30 HISTORY — DX: Obstructive sleep apnea (adult) (pediatric): G47.33

## 2017-11-30 HISTORY — DX: Unspecified osteoarthritis, unspecified site: M19.90

## 2017-11-30 HISTORY — DX: Other intervertebral disc degeneration, lumbar region without mention of lumbar back pain or lower extremity pain: M51.369

## 2017-11-30 HISTORY — DX: Unspecified asthma, uncomplicated: J45.909

## 2017-11-30 HISTORY — DX: Alcohol abuse, in remission: F10.11

## 2017-11-30 HISTORY — DX: Personal history of other diseases of the digestive system: Z87.19

## 2017-11-30 HISTORY — DX: Dyspnea, unspecified: R06.00

## 2017-11-30 HISTORY — DX: Other cervical disc displacement, unspecified cervical region: M50.20

## 2017-11-30 HISTORY — DX: Other constipation: K59.09

## 2017-11-30 HISTORY — DX: Psoriasis, unspecified: L40.9

## 2017-11-30 HISTORY — DX: Unspecified hemorrhoids: K64.9

## 2017-11-30 HISTORY — DX: Presence of dental prosthetic device (complete) (partial): Z97.2

## 2017-11-30 HISTORY — DX: Type 2 diabetes mellitus without complications: E11.9

## 2017-11-30 HISTORY — DX: Anxiety disorder, unspecified: F41.9

## 2017-11-30 HISTORY — DX: Other intervertebral disc degeneration, lumbosacral region without mention of lumbar back pain or lower extremity pain: M51.379

## 2017-11-30 HISTORY — DX: Other intervertebral disc displacement, lumbar region: M51.26

## 2017-11-30 HISTORY — DX: Unspecified symptoms and signs involving the genitourinary system: R39.9

## 2017-11-30 HISTORY — DX: Localized edema: R60.0

## 2017-11-30 HISTORY — DX: Polyneuropathy, unspecified: G62.9

## 2017-11-30 HISTORY — DX: Nausea with vomiting, unspecified: R11.2

## 2017-11-30 HISTORY — DX: Depression, unspecified: F32.A

## 2017-11-30 HISTORY — DX: Personal history of other diseases of urinary system: Z87.448

## 2017-11-30 HISTORY — DX: Other cervical disc degeneration, unspecified cervical region: M50.30

## 2017-11-30 HISTORY — DX: Other intervertebral disc degeneration, lumbosacral region: M51.37

## 2017-11-30 HISTORY — DX: Allergic rhinitis, unspecified: J30.9

## 2017-11-30 HISTORY — DX: Other specified postprocedural states: Z98.890

## 2017-11-30 HISTORY — DX: Other intervertebral disc degeneration, lumbar region: M51.36

## 2017-11-30 HISTORY — DX: Type 2 diabetes mellitus without complications: Z79.4

## 2017-11-30 HISTORY — DX: Major depressive disorder, single episode, unspecified: F32.9

## 2017-11-30 LAB — URINALYSIS, ROUTINE W REFLEX MICROSCOPIC
BILIRUBIN URINE: NEGATIVE
Glucose, UA: NEGATIVE mg/dL
KETONES UR: NEGATIVE mg/dL
LEUKOCYTES UA: NEGATIVE
NITRITE: NEGATIVE
Protein, ur: NEGATIVE mg/dL
SPECIFIC GRAVITY, URINE: 1.015 (ref 1.005–1.030)
pH: 6 (ref 5.0–8.0)

## 2017-11-30 LAB — CBC
HEMATOCRIT: 41.6 % (ref 36.0–46.0)
HEMOGLOBIN: 14 g/dL (ref 12.0–15.0)
MCH: 27.9 pg (ref 26.0–34.0)
MCHC: 33.7 g/dL (ref 30.0–36.0)
MCV: 82.9 fL (ref 78.0–100.0)
Platelets: 335 10*3/uL (ref 150–400)
RBC: 5.02 MIL/uL (ref 3.87–5.11)
RDW: 13.3 % (ref 11.5–15.5)
WBC: 8.7 10*3/uL (ref 4.0–10.5)

## 2017-11-30 LAB — COMPREHENSIVE METABOLIC PANEL
ALK PHOS: 152 U/L — AB (ref 38–126)
ALT: 23 U/L (ref 0–44)
AST: 20 U/L (ref 15–41)
Albumin: 4 g/dL (ref 3.5–5.0)
Anion gap: 8 (ref 5–15)
BUN: 12 mg/dL (ref 6–20)
CALCIUM: 9.7 mg/dL (ref 8.9–10.3)
CHLORIDE: 103 mmol/L (ref 98–111)
CO2: 30 mmol/L (ref 22–32)
CREATININE: 0.97 mg/dL (ref 0.44–1.00)
GFR calc Af Amer: >60 mL/min (ref >60)
GFR calc non Af Amer: >60 mL/min (ref >60)
Glucose, Bld: 182 mg/dL — ABNORMAL HIGH (ref 70–99)
Potassium: 3.4 mmol/L — ABNORMAL LOW (ref 3.5–5.1)
Sodium: 141 mmol/L (ref 135–145)
Total Bilirubin: 0.3 mg/dL (ref 0.3–1.2)
Total Protein: 8.1 g/dL (ref 6.5–8.1)

## 2017-11-30 LAB — PROTIME-INR
INR: 0.94
Prothrombin Time: 12.4 seconds (ref 11.4–15.2)

## 2017-11-30 LAB — SURGICAL PCR SCREEN
MRSA, PCR: NEGATIVE
Staphylococcus aureus: POSITIVE — AB

## 2017-11-30 LAB — HEMOGLOBIN A1C
Hgb A1c MFr Bld: 8.9 % — ABNORMAL HIGH (ref 4.8–5.6)
Mean Plasma Glucose: 208.73 mg/dL

## 2017-11-30 LAB — APTT: aPTT: 26 seconds (ref 24–36)

## 2017-11-30 LAB — GLUCOSE, CAPILLARY: GLUCOSE-CAPILLARY: 138 mg/dL — AB (ref 70–99)

## 2017-11-30 NOTE — Progress Notes (Addendum)
EKG dated 04-05-2017 in epic.  Surgical clearance from pt pcp, dr tower , dated 10-20-2017 in epic and chart.    ADDENDUM:  Resulted positive PCR screen dated 11-30-2017 sent to dr Wynelle Link in epic. Not enough time for pt to complete mupirocin.

## 2017-12-05 MED ORDER — TRANEXAMIC ACID 1000 MG/10ML IV SOLN
1000.0000 mg | INTRAVENOUS | Status: AC
  Administered 2017-12-06: 1000 mg via INTRAVENOUS
  Filled 2017-12-05: qty 1100

## 2017-12-05 MED ORDER — TRANEXAMIC ACID 1000 MG/10ML IV SOLN
2000.0000 mg | INTRAVENOUS | Status: DC
  Filled 2017-12-05: qty 20

## 2017-12-06 ENCOUNTER — Encounter (HOSPITAL_COMMUNITY)
Admission: RE | Disposition: A | Discharge status: Skilled Nursing Facility | Payer: Self-pay | Source: Home / Self Care | Attending: Orthopedic Surgery

## 2017-12-06 ENCOUNTER — Inpatient Hospital Stay (HOSPITAL_COMMUNITY): Payer: Managed Care, Other (non HMO) | Admitting: Certified Registered"

## 2017-12-06 ENCOUNTER — Other Ambulatory Visit: Payer: Self-pay

## 2017-12-06 ENCOUNTER — Inpatient Hospital Stay (HOSPITAL_COMMUNITY)
Admission: RE | Admit: 2017-12-06 | Discharge: 2017-12-09 | DRG: 470 | Disposition: A | Discharge status: Skilled Nursing Facility | Payer: Managed Care, Other (non HMO) | Attending: Orthopedic Surgery | Admitting: Orthopedic Surgery

## 2017-12-06 ENCOUNTER — Encounter (HOSPITAL_COMMUNITY): Payer: Self-pay | Admitting: Emergency Medicine

## 2017-12-06 DIAGNOSIS — Z972 Presence of dental prosthetic device (complete) (partial): Secondary | ICD-10-CM

## 2017-12-06 DIAGNOSIS — F411 Generalized anxiety disorder: Secondary | ICD-10-CM | POA: Diagnosis present

## 2017-12-06 DIAGNOSIS — N3020 Other chronic cystitis without hematuria: Secondary | ICD-10-CM | POA: Diagnosis present

## 2017-12-06 DIAGNOSIS — Z800 Family history of malignant neoplasm of digestive organs: Secondary | ICD-10-CM

## 2017-12-06 DIAGNOSIS — I10 Essential (primary) hypertension: Secondary | ICD-10-CM | POA: Diagnosis present

## 2017-12-06 DIAGNOSIS — J45909 Unspecified asthma, uncomplicated: Secondary | ICD-10-CM | POA: Diagnosis present

## 2017-12-06 DIAGNOSIS — M179 Osteoarthritis of knee, unspecified: Secondary | ICD-10-CM

## 2017-12-06 DIAGNOSIS — Z87891 Personal history of nicotine dependence: Secondary | ICD-10-CM

## 2017-12-06 DIAGNOSIS — Z811 Family history of alcohol abuse and dependence: Secondary | ICD-10-CM | POA: Diagnosis not present

## 2017-12-06 DIAGNOSIS — Z79891 Long term (current) use of opiate analgesic: Secondary | ICD-10-CM

## 2017-12-06 DIAGNOSIS — R279 Unspecified lack of coordination: Secondary | ICD-10-CM | POA: Diagnosis not present

## 2017-12-06 DIAGNOSIS — E785 Hyperlipidemia, unspecified: Secondary | ICD-10-CM | POA: Diagnosis present

## 2017-12-06 DIAGNOSIS — G8929 Other chronic pain: Secondary | ICD-10-CM | POA: Diagnosis present

## 2017-12-06 DIAGNOSIS — K219 Gastro-esophageal reflux disease without esophagitis: Secondary | ICD-10-CM | POA: Diagnosis present

## 2017-12-06 DIAGNOSIS — Z885 Allergy status to narcotic agent status: Secondary | ICD-10-CM

## 2017-12-06 DIAGNOSIS — Z7951 Long term (current) use of inhaled steroids: Secondary | ICD-10-CM | POA: Diagnosis not present

## 2017-12-06 DIAGNOSIS — K5909 Other constipation: Secondary | ICD-10-CM | POA: Diagnosis present

## 2017-12-06 DIAGNOSIS — Z6841 Body Mass Index (BMI) 40.0 and over, adult: Secondary | ICD-10-CM

## 2017-12-06 DIAGNOSIS — E1142 Type 2 diabetes mellitus with diabetic polyneuropathy: Secondary | ICD-10-CM | POA: Diagnosis present

## 2017-12-06 DIAGNOSIS — Z8249 Family history of ischemic heart disease and other diseases of the circulatory system: Secondary | ICD-10-CM

## 2017-12-06 DIAGNOSIS — Z888 Allergy status to other drugs, medicaments and biological substances status: Secondary | ICD-10-CM

## 2017-12-06 DIAGNOSIS — M1712 Unilateral primary osteoarthritis, left knee: Principal | ICD-10-CM | POA: Diagnosis present

## 2017-12-06 DIAGNOSIS — K589 Irritable bowel syndrome without diarrhea: Secondary | ICD-10-CM | POA: Diagnosis present

## 2017-12-06 DIAGNOSIS — Z9049 Acquired absence of other specified parts of digestive tract: Secondary | ICD-10-CM

## 2017-12-06 DIAGNOSIS — Z7952 Long term (current) use of systemic steroids: Secondary | ICD-10-CM

## 2017-12-06 DIAGNOSIS — Z79899 Other long term (current) drug therapy: Secondary | ICD-10-CM

## 2017-12-06 DIAGNOSIS — M25762 Osteophyte, left knee: Secondary | ICD-10-CM | POA: Diagnosis present

## 2017-12-06 DIAGNOSIS — F418 Other specified anxiety disorders: Secondary | ICD-10-CM | POA: Diagnosis present

## 2017-12-06 DIAGNOSIS — L409 Psoriasis, unspecified: Secondary | ICD-10-CM | POA: Diagnosis present

## 2017-12-06 DIAGNOSIS — G4733 Obstructive sleep apnea (adult) (pediatric): Secondary | ICD-10-CM | POA: Diagnosis present

## 2017-12-06 DIAGNOSIS — F1011 Alcohol abuse, in remission: Secondary | ICD-10-CM | POA: Diagnosis present

## 2017-12-06 DIAGNOSIS — M1710 Unilateral primary osteoarthritis, unspecified knee: Secondary | ICD-10-CM

## 2017-12-06 DIAGNOSIS — Z794 Long term (current) use of insulin: Secondary | ICD-10-CM

## 2017-12-06 DIAGNOSIS — I252 Old myocardial infarction: Secondary | ICD-10-CM

## 2017-12-06 DIAGNOSIS — Z882 Allergy status to sulfonamides status: Secondary | ICD-10-CM

## 2017-12-06 DIAGNOSIS — Z743 Need for continuous supervision: Secondary | ICD-10-CM | POA: Diagnosis not present

## 2017-12-06 DIAGNOSIS — Z90711 Acquired absence of uterus with remaining cervical stump: Secondary | ICD-10-CM

## 2017-12-06 DIAGNOSIS — G8918 Other acute postprocedural pain: Secondary | ICD-10-CM | POA: Diagnosis not present

## 2017-12-06 DIAGNOSIS — Z801 Family history of malignant neoplasm of trachea, bronchus and lung: Secondary | ICD-10-CM

## 2017-12-06 HISTORY — PX: TOTAL KNEE ARTHROPLASTY: SHX125

## 2017-12-06 LAB — GLUCOSE, CAPILLARY
GLUCOSE-CAPILLARY: 157 mg/dL — AB (ref 70–99)
Glucose-Capillary: 202 mg/dL — ABNORMAL HIGH (ref 70–99)
Glucose-Capillary: 229 mg/dL — ABNORMAL HIGH (ref 70–99)
Glucose-Capillary: 354 mg/dL — ABNORMAL HIGH (ref 70–99)

## 2017-12-06 LAB — TYPE AND SCREEN
ABO/RH(D): A POS
Antibody Screen: NEGATIVE

## 2017-12-06 SURGERY — ARTHROPLASTY, KNEE, TOTAL
Anesthesia: Spinal | Site: Knee | Laterality: Left

## 2017-12-06 MED ORDER — ZOLPIDEM TARTRATE 5 MG PO TABS
5.0000 mg | ORAL_TABLET | Freq: Every day | ORAL | Status: DC
  Administered 2017-12-06 – 2017-12-08 (×3): 5 mg via ORAL
  Filled 2017-12-06 (×3): qty 1

## 2017-12-06 MED ORDER — DEXAMETHASONE SODIUM PHOSPHATE 10 MG/ML IJ SOLN
10.0000 mg | Freq: Once | INTRAMUSCULAR | Status: AC
  Administered 2017-12-07: 10 mg via INTRAVENOUS
  Filled 2017-12-06: qty 1

## 2017-12-06 MED ORDER — HYDROMORPHONE HCL 1 MG/ML IJ SOLN
0.2500 mg | INTRAMUSCULAR | Status: DC | PRN
  Administered 2017-12-06 (×2): 0.25 mg via INTRAVENOUS
  Administered 2017-12-06 (×3): 0.5 mg via INTRAVENOUS

## 2017-12-06 MED ORDER — MIDAZOLAM HCL 2 MG/2ML IJ SOLN
INTRAMUSCULAR | Status: AC
  Filled 2017-12-06: qty 2

## 2017-12-06 MED ORDER — HYDROMORPHONE HCL 1 MG/ML IJ SOLN
1.0000 mg | INTRAMUSCULAR | Status: DC | PRN
  Administered 2017-12-06 – 2017-12-07 (×3): 2 mg via INTRAVENOUS
  Administered 2017-12-07: 1 mg via INTRAVENOUS
  Administered 2017-12-07: 2 mg via INTRAVENOUS
  Administered 2017-12-08 (×4): 1 mg via INTRAVENOUS
  Administered 2017-12-08: 2 mg via INTRAVENOUS
  Administered 2017-12-09: 1 mg via INTRAVENOUS
  Filled 2017-12-06: qty 1
  Filled 2017-12-06: qty 2
  Filled 2017-12-06: qty 1
  Filled 2017-12-06: qty 2
  Filled 2017-12-06: qty 1
  Filled 2017-12-06 (×2): qty 2
  Filled 2017-12-06 (×3): qty 1
  Filled 2017-12-06: qty 2

## 2017-12-06 MED ORDER — INSULIN ASPART 100 UNIT/ML ~~LOC~~ SOLN: 0.0000 [IU] | Freq: Three times a day (TID) | SUBCUTANEOUS | Status: DC

## 2017-12-06 MED ORDER — FLEET ENEMA 7-19 GM/118ML RE ENEM
1.0000 | ENEMA | Freq: Once | RECTAL | Status: AC | PRN
  Administered 2017-12-09: 1 via RECTAL
  Filled 2017-12-06: qty 1

## 2017-12-06 MED ORDER — HYDROMORPHONE HCL 1 MG/ML IJ SOLN
INTRAMUSCULAR | Status: AC
  Filled 2017-12-06: qty 1

## 2017-12-06 MED ORDER — METOCLOPRAMIDE HCL 5 MG PO TABS
5.0000 mg | ORAL_TABLET | Freq: Three times a day (TID) | ORAL | Status: DC | PRN
  Administered 2017-12-07: 10 mg via ORAL
  Filled 2017-12-06: qty 2

## 2017-12-06 MED ORDER — ONDANSETRON HCL 4 MG/2ML IJ SOLN
INTRAMUSCULAR | Status: DC | PRN
  Administered 2017-12-06: 4 mg via INTRAVENOUS

## 2017-12-06 MED ORDER — METOCLOPRAMIDE HCL 5 MG/ML IJ SOLN
5.0000 mg | Freq: Three times a day (TID) | INTRAMUSCULAR | Status: DC | PRN
  Administered 2017-12-06: 10 mg via INTRAVENOUS
  Filled 2017-12-06 (×2): qty 2

## 2017-12-06 MED ORDER — OXYCODONE HCL 5 MG PO TABS
5.0000 mg | ORAL_TABLET | ORAL | Status: DC | PRN
  Administered 2017-12-07: 10 mg via ORAL

## 2017-12-06 MED ORDER — ONDANSETRON HCL 4 MG/2ML IJ SOLN
4.0000 mg | Freq: Four times a day (QID) | INTRAMUSCULAR | Status: DC | PRN
  Administered 2017-12-06 – 2017-12-07 (×3): 4 mg via INTRAVENOUS
  Filled 2017-12-06 (×3): qty 2

## 2017-12-06 MED ORDER — GABAPENTIN 300 MG PO CAPS
300.0000 mg | ORAL_CAPSULE | Freq: Once | ORAL | Status: DC
  Filled 2017-12-06: qty 1

## 2017-12-06 MED ORDER — PHENOL 1.4 % MT LIQD: 1.0000 | OROMUCOSAL | Status: DC | PRN

## 2017-12-06 MED ORDER — HYDROCHLOROTHIAZIDE 25 MG PO TABS
25.0000 mg | ORAL_TABLET | Freq: Every day | ORAL | Status: DC
  Administered 2017-12-06 – 2017-12-09 (×4): 25 mg via ORAL
  Filled 2017-12-06 (×4): qty 1

## 2017-12-06 MED ORDER — LACTATED RINGERS IV SOLN
INTRAVENOUS | Status: DC
  Administered 2017-12-06 (×2): via INTRAVENOUS

## 2017-12-06 MED ORDER — INSULIN ASPART 100 UNIT/ML ~~LOC~~ SOLN
0.0000 [IU] | Freq: Three times a day (TID) | SUBCUTANEOUS | Status: DC
  Administered 2017-12-07: 11 [IU] via SUBCUTANEOUS
  Administered 2017-12-07 (×2): 8 [IU] via SUBCUTANEOUS
  Administered 2017-12-08: 11 [IU] via SUBCUTANEOUS
  Administered 2017-12-08: 5 [IU] via SUBCUTANEOUS
  Administered 2017-12-08: 8 [IU] via SUBCUTANEOUS
  Administered 2017-12-09 (×2): 5 [IU] via SUBCUTANEOUS

## 2017-12-06 MED ORDER — ACETAMINOPHEN 10 MG/ML IV SOLN
1000.0000 mg | Freq: Once | INTRAVENOUS | Status: AC
  Administered 2017-12-06: 1000 mg via INTRAVENOUS
  Filled 2017-12-06: qty 100

## 2017-12-06 MED ORDER — DEXAMETHASONE SODIUM PHOSPHATE 10 MG/ML IJ SOLN
INTRAMUSCULAR | Status: AC
  Filled 2017-12-06: qty 1

## 2017-12-06 MED ORDER — SERTRALINE HCL 100 MG PO TABS
200.0000 mg | ORAL_TABLET | Freq: Every morning | ORAL | Status: DC
  Administered 2017-12-07 – 2017-12-09 (×3): 200 mg via ORAL
  Filled 2017-12-06 (×3): qty 2

## 2017-12-06 MED ORDER — DIPHENHYDRAMINE HCL 12.5 MG/5ML PO ELIX
12.5000 mg | ORAL_SOLUTION | ORAL | Status: DC | PRN
  Administered 2017-12-08: 12.5 mg via ORAL
  Filled 2017-12-06: qty 5

## 2017-12-06 MED ORDER — HYDROMORPHONE HCL 1 MG/ML IJ SOLN
0.5000 mg | INTRAMUSCULAR | Status: DC | PRN
  Administered 2017-12-06: 1 mg via INTRAVENOUS
  Filled 2017-12-06: qty 1

## 2017-12-06 MED ORDER — INSULIN ASPART 100 UNIT/ML ~~LOC~~ SOLN
0.0000 [IU] | Freq: Every day | SUBCUTANEOUS | Status: DC
  Administered 2017-12-06: 5 [IU] via SUBCUTANEOUS
  Administered 2017-12-07 – 2017-12-08 (×2): 3 [IU] via SUBCUTANEOUS

## 2017-12-06 MED ORDER — PROPOFOL 10 MG/ML IV BOLUS
INTRAVENOUS | Status: AC
  Filled 2017-12-06: qty 40

## 2017-12-06 MED ORDER — PROPOFOL 10 MG/ML IV BOLUS
INTRAVENOUS | Status: AC
  Filled 2017-12-06: qty 20

## 2017-12-06 MED ORDER — BUDESONIDE 0.25 MG/2ML IN SUSP
0.2500 mg | Freq: Two times a day (BID) | RESPIRATORY_TRACT | Status: DC
  Administered 2017-12-07 – 2017-12-09 (×5): 0.25 mg via RESPIRATORY_TRACT
  Filled 2017-12-06 (×6): qty 2

## 2017-12-06 MED ORDER — ACETAMINOPHEN 500 MG PO TABS
1000.0000 mg | ORAL_TABLET | Freq: Four times a day (QID) | ORAL | Status: AC
  Administered 2017-12-06 – 2017-12-07 (×4): 1000 mg via ORAL
  Filled 2017-12-06 (×4): qty 2

## 2017-12-06 MED ORDER — ONDANSETRON HCL 4 MG PO TABS
4.0000 mg | ORAL_TABLET | Freq: Four times a day (QID) | ORAL | Status: DC | PRN
  Administered 2017-12-07: 4 mg via ORAL
  Filled 2017-12-06: qty 1

## 2017-12-06 MED ORDER — PERPHENAZINE 2 MG PO TABS
2.0000 mg | ORAL_TABLET | Freq: Every day | ORAL | Status: DC
  Administered 2017-12-06 – 2017-12-08 (×3): 2 mg via ORAL
  Filled 2017-12-06 (×3): qty 1

## 2017-12-06 MED ORDER — DOCUSATE SODIUM 100 MG PO CAPS
100.0000 mg | ORAL_CAPSULE | Freq: Two times a day (BID) | ORAL | Status: DC
  Administered 2017-12-06 – 2017-12-09 (×6): 100 mg via ORAL
  Filled 2017-12-06 (×6): qty 1

## 2017-12-06 MED ORDER — MIDAZOLAM HCL 5 MG/5ML IJ SOLN
INTRAMUSCULAR | Status: DC | PRN
  Administered 2017-12-06: 1 mg via INTRAVENOUS
  Administered 2017-12-06: 2 mg via INTRAVENOUS
  Administered 2017-12-06: 1 mg via INTRAVENOUS

## 2017-12-06 MED ORDER — FLUTICASONE PROPIONATE 50 MCG/ACT NA SUSP: 2.0000 | Freq: Every day | NASAL | Status: DC | PRN

## 2017-12-06 MED ORDER — BUPIVACAINE LIPOSOME 1.3 % IJ SUSP: 20.0000 mL | Freq: Once | INTRAMUSCULAR | Status: DC

## 2017-12-06 MED ORDER — ALBUTEROL SULFATE (2.5 MG/3ML) 0.083% IN NEBU: 3.0000 mL | INHALATION_SOLUTION | Freq: Four times a day (QID) | RESPIRATORY_TRACT | Status: DC | PRN

## 2017-12-06 MED ORDER — SODIUM CHLORIDE 0.9 % IJ SOLN
INTRAMUSCULAR | Status: AC
  Filled 2017-12-06: qty 10

## 2017-12-06 MED ORDER — CEFAZOLIN SODIUM-DEXTROSE 2-4 GM/100ML-% IV SOLN
2.0000 g | INTRAVENOUS | Status: AC
  Administered 2017-12-06: 2 g via INTRAVENOUS
  Filled 2017-12-06: qty 100

## 2017-12-06 MED ORDER — LORAZEPAM 1 MG PO TABS
1.0000 mg | ORAL_TABLET | Freq: Four times a day (QID) | ORAL | Status: DC | PRN
  Administered 2017-12-06: 1 mg via ORAL
  Filled 2017-12-06: qty 1

## 2017-12-06 MED ORDER — MIDAZOLAM HCL 2 MG/2ML IJ SOLN
2.0000 mg | Freq: Once | INTRAMUSCULAR | Status: AC
  Administered 2017-12-06: 2 mg via INTRAVENOUS
  Filled 2017-12-06: qty 2

## 2017-12-06 MED ORDER — CEFAZOLIN SODIUM-DEXTROSE 2-4 GM/100ML-% IV SOLN
2.0000 g | Freq: Four times a day (QID) | INTRAVENOUS | Status: AC
Start: 2017-12-06 — End: 2017-12-07
  Administered 2017-12-06 – 2017-12-07 (×2): 2 g via INTRAVENOUS
  Filled 2017-12-06 (×2): qty 100

## 2017-12-06 MED ORDER — ASPIRIN EC 325 MG PO TBEC
325.0000 mg | DELAYED_RELEASE_TABLET | Freq: Two times a day (BID) | ORAL | Status: DC
  Administered 2017-12-07 – 2017-12-09 (×5): 325 mg via ORAL
  Filled 2017-12-06 (×5): qty 1

## 2017-12-06 MED ORDER — ALBUTEROL SULFATE (2.5 MG/3ML) 0.083% IN NEBU
2.5000 mg | INHALATION_SOLUTION | Freq: Four times a day (QID) | RESPIRATORY_TRACT | Status: DC | PRN
  Administered 2017-12-06: 2.5 mg via RESPIRATORY_TRACT

## 2017-12-06 MED ORDER — ONDANSETRON HCL 4 MG/2ML IJ SOLN
INTRAMUSCULAR | Status: AC
  Filled 2017-12-06: qty 2

## 2017-12-06 MED ORDER — MENTHOL 3 MG MT LOZG: 1.0000 | LOZENGE | OROMUCOSAL | Status: DC | PRN

## 2017-12-06 MED ORDER — POTASSIUM CHLORIDE CRYS ER 10 MEQ PO TBCR
10.0000 meq | EXTENDED_RELEASE_TABLET | Freq: Every morning | ORAL | Status: DC
  Administered 2017-12-07 – 2017-12-09 (×3): 10 meq via ORAL
  Filled 2017-12-06 (×3): qty 1

## 2017-12-06 MED ORDER — OXYCODONE HCL 5 MG PO TABS
10.0000 mg | ORAL_TABLET | ORAL | Status: DC | PRN
  Administered 2017-12-06 – 2017-12-07 (×3): 15 mg via ORAL
  Administered 2017-12-08 (×2): 10 mg via ORAL
  Administered 2017-12-08 – 2017-12-09 (×4): 15 mg via ORAL
  Filled 2017-12-06: qty 3
  Filled 2017-12-06: qty 2
  Filled 2017-12-06 (×3): qty 3
  Filled 2017-12-06: qty 2
  Filled 2017-12-06 (×2): qty 3
  Filled 2017-12-06: qty 2
  Filled 2017-12-06: qty 3

## 2017-12-06 MED ORDER — METHOCARBAMOL 1000 MG/10ML IJ SOLN
500.0000 mg | Freq: Four times a day (QID) | INTRAMUSCULAR | Status: DC | PRN
  Administered 2017-12-06: 500 mg via INTRAVENOUS
  Filled 2017-12-06: qty 550

## 2017-12-06 MED ORDER — SODIUM CHLORIDE 0.9 % IJ SOLN
INTRAMUSCULAR | Status: AC
  Filled 2017-12-06: qty 50

## 2017-12-06 MED ORDER — BISACODYL 10 MG RE SUPP
10.0000 mg | Freq: Every day | RECTAL | Status: DC | PRN
  Administered 2017-12-09: 10 mg via RECTAL
  Filled 2017-12-06: qty 1

## 2017-12-06 MED ORDER — PANTOPRAZOLE SODIUM 40 MG PO TBEC
40.0000 mg | DELAYED_RELEASE_TABLET | Freq: Every morning | ORAL | Status: DC
  Administered 2017-12-07 – 2017-12-09 (×3): 40 mg via ORAL
  Filled 2017-12-06 (×3): qty 1

## 2017-12-06 MED ORDER — PROPOFOL 10 MG/ML IV BOLUS
INTRAVENOUS | Status: DC | PRN
  Administered 2017-12-06: 20 mg via INTRAVENOUS
  Administered 2017-12-06: 30 mg via INTRAVENOUS
  Administered 2017-12-06 (×2): 20 mg via INTRAVENOUS

## 2017-12-06 MED ORDER — CHLORHEXIDINE GLUCONATE 4 % EX LIQD: 60.0000 mL | Freq: Once | CUTANEOUS | Status: DC

## 2017-12-06 MED ORDER — METHOCARBAMOL 500 MG PO TABS
500.0000 mg | ORAL_TABLET | Freq: Four times a day (QID) | ORAL | Status: DC | PRN
  Administered 2017-12-06 – 2017-12-09 (×6): 500 mg via ORAL
  Filled 2017-12-06 (×7): qty 1

## 2017-12-06 MED ORDER — SODIUM CHLORIDE 0.9 % IR SOLN
Status: DC | PRN
  Administered 2017-12-06: 1000 mL

## 2017-12-06 MED ORDER — POLYETHYLENE GLYCOL 3350 17 G PO PACK: 17.0000 g | PACK | Freq: Every day | ORAL | Status: DC | PRN

## 2017-12-06 MED ORDER — FENTANYL CITRATE (PF) 100 MCG/2ML IJ SOLN
100.0000 ug | Freq: Once | INTRAMUSCULAR | Status: AC
Start: 2017-12-06 — End: 2017-12-06
  Administered 2017-12-06: 100 ug via INTRAVENOUS
  Filled 2017-12-06: qty 2

## 2017-12-06 MED ORDER — PROPOFOL 500 MG/50ML IV EMUL
INTRAVENOUS | Status: DC | PRN
  Administered 2017-12-06: 75 ug/kg/min via INTRAVENOUS

## 2017-12-06 MED ORDER — DEXAMETHASONE SODIUM PHOSPHATE 10 MG/ML IJ SOLN
8.0000 mg | Freq: Once | INTRAMUSCULAR | Status: AC
  Administered 2017-12-06: 10 mg via INTRAVENOUS

## 2017-12-06 MED ORDER — ALBUTEROL SULFATE (2.5 MG/3ML) 0.083% IN NEBU
INHALATION_SOLUTION | RESPIRATORY_TRACT | Status: AC
  Filled 2017-12-06: qty 3

## 2017-12-06 MED ORDER — LABETALOL HCL 5 MG/ML IV SOLN
10.0000 mg | INTRAVENOUS | Status: DC | PRN
  Administered 2017-12-06: 10 mg via INTRAVENOUS
  Filled 2017-12-06 (×2): qty 4

## 2017-12-06 MED ORDER — SODIUM CHLORIDE 0.9 % IV SOLN
INTRAVENOUS | Status: DC
  Administered 2017-12-06 – 2017-12-07 (×2): via INTRAVENOUS

## 2017-12-06 MED ORDER — MEPERIDINE HCL 50 MG/ML IJ SOLN: 6.2500 mg | INTRAMUSCULAR | Status: DC | PRN

## 2017-12-06 MED ORDER — INSULIN DETEMIR 100 UNIT/ML ~~LOC~~ SOLN
50.0000 [IU] | Freq: Every day | SUBCUTANEOUS | Status: DC
  Administered 2017-12-06 – 2017-12-08 (×3): 50 [IU] via SUBCUTANEOUS
  Filled 2017-12-06 (×4): qty 0.5

## 2017-12-06 MED ORDER — ONDANSETRON HCL 4 MG/2ML IJ SOLN: 4.0000 mg | Freq: Once | INTRAMUSCULAR | Status: DC | PRN

## 2017-12-06 SURGICAL SUPPLY — 48 items
BAG SPEC THK2 15X12 ZIP CLS (MISCELLANEOUS) ×1
BAG ZIPLOCK 12X15 (MISCELLANEOUS) ×3 IMPLANT
BANDAGE ACE 6X5 VEL STRL LF (GAUZE/BANDAGES/DRESSINGS) ×3 IMPLANT
BLADE SAG 18X100X1.27 (BLADE) ×3 IMPLANT
BLADE SAW SGTL 11.0X1.19X90.0M (BLADE) ×3 IMPLANT
BOWL SMART MIX CTS (DISPOSABLE) ×3 IMPLANT
CAP KNEE TOTAL 3 SIGMA ×2 IMPLANT
CEMENT HV SMART SET (Cement) ×6 IMPLANT
CLOSURE WOUND 1/2 X4 (GAUZE/BANDAGES/DRESSINGS) ×1
COVER SURGICAL LIGHT HANDLE (MISCELLANEOUS) ×3 IMPLANT
CUFF TOURN SGL QUICK 34 (TOURNIQUET CUFF) ×3
CUFF TRNQT CYL 34X4X40X1 (TOURNIQUET CUFF) ×1 IMPLANT
DECANTER SPIKE VIAL GLASS SM (MISCELLANEOUS) ×3 IMPLANT
DRAPE U-SHAPE 47X51 STRL (DRAPES) ×3 IMPLANT
DRSG ADAPTIC 3X8 NADH LF (GAUZE/BANDAGES/DRESSINGS) ×3 IMPLANT
DRSG PAD ABDOMINAL 8X10 ST (GAUZE/BANDAGES/DRESSINGS) ×3 IMPLANT
DURAPREP 26ML APPLICATOR (WOUND CARE) ×3 IMPLANT
ELECT REM PT RETURN 15FT ADLT (MISCELLANEOUS) ×3 IMPLANT
EVACUATOR 1/8 PVC DRAIN (DRAIN) ×3 IMPLANT
GAUZE SPONGE 4X4 12PLY STRL (GAUZE/BANDAGES/DRESSINGS) ×3 IMPLANT
GLOVE BIO SURGEON STRL SZ8 (GLOVE) ×6 IMPLANT
GLOVE BIOGEL PI IND STRL 6.5 (GLOVE) ×1 IMPLANT
GLOVE BIOGEL PI IND STRL 8 (GLOVE) ×1 IMPLANT
GLOVE BIOGEL PI INDICATOR 6.5 (GLOVE) ×2
GLOVE BIOGEL PI INDICATOR 8 (GLOVE) ×2
GLOVE SURG SS PI 6.5 STRL IVOR (GLOVE) ×3 IMPLANT
GOWN STRL REUS W/TWL LRG LVL3 (GOWN DISPOSABLE) ×6 IMPLANT
HANDPIECE INTERPULSE COAX TIP (DISPOSABLE) ×3
HOLDER FOLEY CATH W/STRAP (MISCELLANEOUS) IMPLANT
IMMOBILIZER KNEE 20 (SOFTGOODS) ×3
IMMOBILIZER KNEE 20 THIGH 36 (SOFTGOODS) ×1 IMPLANT
MANIFOLD NEPTUNE II (INSTRUMENTS) ×3 IMPLANT
NS IRRIG 1000ML POUR BTL (IV SOLUTION) ×3 IMPLANT
PACK TOTAL KNEE CUSTOM (KITS) ×3 IMPLANT
PAD ABD 8X10 STRL (GAUZE/BANDAGES/DRESSINGS) ×2 IMPLANT
PADDING CAST COTTON 6X4 STRL (CAST SUPPLIES) ×7 IMPLANT
POSITIONER SURGICAL ARM (MISCELLANEOUS) ×3 IMPLANT
SET HNDPC FAN SPRY TIP SCT (DISPOSABLE) ×1 IMPLANT
STRIP CLOSURE SKIN 1/2X4 (GAUZE/BANDAGES/DRESSINGS) ×3 IMPLANT
SUT MNCRL AB 4-0 PS2 18 (SUTURE) ×3 IMPLANT
SUT STRATAFIX 0 PDS 27 VIOLET (SUTURE) ×3
SUT VIC AB 2-0 CT1 27 (SUTURE) ×9
SUT VIC AB 2-0 CT1 TAPERPNT 27 (SUTURE) ×3 IMPLANT
SUTURE STRATFX 0 PDS 27 VIOLET (SUTURE) ×1 IMPLANT
TRAY FOLEY MTR SLVR 16FR STAT (SET/KITS/TRAYS/PACK) ×3 IMPLANT
WATER STERILE IRR 1000ML POUR (IV SOLUTION) ×6 IMPLANT
WRAP KNEE MAXI GEL POST OP (GAUZE/BANDAGES/DRESSINGS) ×3 IMPLANT
YANKAUER SUCT BULB TIP 10FT TU (MISCELLANEOUS) ×3 IMPLANT

## 2017-12-06 NOTE — Interval H&P Note (Signed)
History and Physical Interval Note:  12/06/2017 11:02 AM  Alexa Woods  has presented today for surgery, with the diagnosis of Left knee osteoarthritis  The various methods of treatment have been discussed with the patient and family. After consideration of risks, benefits and other options for treatment, the patient has consented to  Procedure(s): LEFT TOTAL LEFT KNEE ARTHROPLASTY (Left) as a surgical intervention .  The patient's history has been reviewed, patient examined, no change in status, stable for surgery.  I have reviewed the patient's chart and labs.  Questions were answered to the patient's satisfaction.     Pilar Plate Clydie Dillen

## 2017-12-06 NOTE — Anesthesia Postprocedure Evaluation (Signed)
Anesthesia Post Note  Patient: MARKITTA AUSBURN  Procedure(s) Performed: LEFT TOTAL LEFT KNEE ARTHROPLASTY (Left Knee)     Patient location during evaluation: PACU Anesthesia Type: Spinal Level of consciousness: oriented and awake and alert Pain management: pain level controlled Vital Signs Assessment: post-procedure vital signs reviewed and stable Respiratory status: spontaneous breathing, respiratory function stable and patient connected to nasal cannula oxygen Cardiovascular status: blood pressure returned to baseline and stable Postop Assessment: no headache, no backache and no apparent nausea or vomiting Anesthetic complications: no    Last Vitals:  Vitals:   12/06/17 1715 12/06/17 1727  BP:  (!) 154/81  Pulse:    Resp:    Temp:  36.6 C  SpO2: 97% 96%    Last Pain:  Vitals:   12/06/17 1715  TempSrc:   PainSc: 8       LLE Sensation: Numbness (12/06/17 1727)   RLE Sensation: Numbness (12/06/17 1727) L Sensory Level: S1-Sole of foot, small toes (12/06/17 1727) R Sensory Level: S1-Sole of foot, small toes (12/06/17 1727)  Kamrynn Melott DAVID

## 2017-12-06 NOTE — Anesthesia Procedure Notes (Signed)
Anesthesia Regional Block: Adductor canal block   Pre-Anesthetic Checklist: ,, timeout performed, Correct Patient, Correct Site, Correct Laterality, Correct Procedure, Correct Position, site marked, Risks and benefits discussed,  Surgical consent,  Pre-op evaluation,  At surgeon's request and post-op pain management  Laterality: Right  Prep: chloraprep       Needles:  Injection technique: Single-shot  Needle Type: Echogenic Stimulator Needle     Needle Length: 9cm  Needle Gauge: 21     Additional Needles:   Narrative:  Start time: 12/06/2017 12:40 PM End time: 12/06/2017 12:50 PM Injection made incrementally with aspirations every 5 mL.  Performed by: Personally  Anesthesiologist: Lillia Abed, MD  Additional Notes: Monitors applied. Patient sedated. Sterile prep and drape,hand hygiene and sterile gloves were used. Relevant anatomy identified.Needle position confirmed.Local anesthetic injected incrementally after negative aspiration. Local anesthetic spread visualized around nerve(s). Vascular puncture avoided. No complications. Image printed for medical record.The patient tolerated the procedure well.    Lillia Abed MD

## 2017-12-06 NOTE — Anesthesia Preprocedure Evaluation (Signed)
Anesthesia Evaluation  Patient identified by MRN, date of birth, ID band Patient awake    Reviewed: Allergy & Precautions, NPO status , Patient's Chart, lab work & pertinent test results  History of Anesthesia Complications (+) PONV  Airway Mallampati: I  TM Distance: >3 FB Neck ROM: Full    Dental   Pulmonary asthma , sleep apnea , former smoker,    Pulmonary exam normal        Cardiovascular hypertension, Pt. on medications Normal cardiovascular exam     Neuro/Psych Anxiety Depression    GI/Hepatic GERD  Medicated and Controlled,  Endo/Other  diabetes, Type 2, Oral Hypoglycemic Agents  Renal/GU      Musculoskeletal   Abdominal   Peds  Hematology   Anesthesia Other Findings   Reproductive/Obstetrics                             Anesthesia Physical Anesthesia Plan  ASA: III  Anesthesia Plan: Spinal   Post-op Pain Management:  Regional for Post-op pain   Induction: Intravenous  PONV Risk Score and Plan: 3 and Ondansetron, Treatment may vary due to age or medical condition and Midazolam  Airway Management Planned: Simple Face Mask  Additional Equipment:   Intra-op Plan:   Post-operative Plan:   Informed Consent: I have reviewed the patients History and Physical, chart, labs and discussed the procedure including the risks, benefits and alternatives for the proposed anesthesia with the patient or authorized representative who has indicated his/her understanding and acceptance.     Plan Discussed with: CRNA and Surgeon  Anesthesia Plan Comments:         Anesthesia Quick Evaluation

## 2017-12-06 NOTE — Transfer of Care (Signed)
Immediate Anesthesia Transfer of Care Note  Patient: CATRICIA SCHEERER  Procedure(s) Performed: LEFT TOTAL LEFT KNEE ARTHROPLASTY (Left Knee)  Patient Location: PACU  Anesthesia Type:Regional and Spinal  Level of Consciousness: awake, alert  and oriented  Airway & Oxygen Therapy: Patient Spontanous Breathing and Patient connected to face mask oxygen  Post-op Assessment: Report given to RN and Post -op Vital signs reviewed and stable  Post vital signs: Reviewed and stable  Last Vitals:  Vitals Value Taken Time  BP    Temp    Pulse 97 12/06/2017  2:36 PM  Resp 15 12/06/2017  2:36 PM  SpO2 98 % 12/06/2017  2:36 PM  Vitals shown include unvalidated device data.  Last Pain:  Vitals:   12/06/17 1054  TempSrc:   PainSc: 7       Patients Stated Pain Goal: 4 (02/72/53 6644)  Complications: No apparent anesthesia complications

## 2017-12-06 NOTE — Discharge Instructions (Signed)

## 2017-12-06 NOTE — Progress Notes (Signed)
AssistedDr. Ossey with left, ultrasound guided, adductor canal block. Side rails up, monitors on throughout procedure. See vital signs in flow sheet. Tolerated Procedure well.  

## 2017-12-06 NOTE — Op Note (Signed)
OPERATIVE REPORT-TOTAL KNEE ARTHROPLASTY   Pre-operative diagnosis- Osteoarthritis  Left knee(s)  Post-operative diagnosis- Osteoarthritis Left knee(s)  Procedure-  Left  Total Knee Arthroplasty  Surgeon- Alexa Woods. Tavie Haseman, MD  Assistant- Ardeen Jourdain, PA-C   Anesthesia-  Adductor canal block and spinal  EBL-50 mL   Drains Hemovac  Tourniquet time-  Total Tourniquet Time Documented: Thigh (Left) - 35 minutes Total: Thigh (Left) - 35 minutes     Complications- None  Condition-PACU - hemodynamically stable.   Brief Clinical Note  Alexa Woods is a 57 y.o. year old female with end stage OA of her left knee with progressively worsening pain and dysfunction. She has constant pain, with activity and at rest and significant functional deficits with difficulties even with ADLs. She has had extensive non-op management including analgesics, injections of cortisone and viscosupplements, and home exercise program, but remains in significant pain with significant dysfunction. Radiographs show bone on bone arthritis medially with recurrent effusions. She presents now for left Total Knee Arthroplasty.    Procedure in detail---   The patient is brought into the operating room and positioned supine on the operating table. After successful administration of  Adductor canal block and spinal,   a tourniquet is placed high on the  Left thigh(s) and the lower extremity is prepped and draped in the usual sterile fashion. Time out is performed by the operating team and then the  Left lower extremity is wrapped in Esmarch, knee flexed and the tourniquet inflated to 300 mmHg.       A midline incision is made with a ten blade through the subcutaneous tissue to the level of the extensor mechanism. A fresh blade is used to make a medial parapatellar arthrotomy. Soft tissue over the proximal medial tibia is subperiosteally elevated to the joint line with a knife and into the semimembranosus bursa with a  Cobb elevator. Soft tissue over the proximal lateral tibia is elevated with attention being paid to avoiding the patellar tendon on the tibial tubercle. The patella is everted, knee flexed 90 degrees and the ACL and PCL are removed. Findings are bone on bone medial with marginal osteophytes medially.      The drill is used to create a starting hole in the distal femur and the canal is thoroughly irrigated with sterile saline to remove the fatty contents. The 5 degree Left  valgus alignment guide is placed into the femoral canal and the distal femoral cutting block is pinned to remove 10 mm off the distal femur. Resection is made with an oscillating saw.      The tibia is subluxed forward and the menisci are removed. The extramedullary alignment guide is placed referencing proximally at the medial aspect of the tibial tubercle and distally along the second metatarsal axis and tibial crest. The block is pinned to remove 12mm off the more deficient medial  side. Resection is made with an oscillating saw. Size 3is the most appropriate size for the tibia and the proximal tibia is prepared with the modular drill and keel punch for that size.      The femoral sizing guide is placed and size 3 is most appropriate. Rotation is marked off the epicondylar axis and confirmed by creating a rectangular flexion gap at 90 degrees. The size 3 cutting block is pinned in this rotation and the anterior, posterior and chamfer cuts are made with the oscillating saw. The intercondylar block is then placed and that cut is made.  Trial size 3 tibial component, trial size 3 posterior stabilized femur and a 10  mm posterior stabilized rotating platform insert trial is placed. Full extension is achieved with excellent varus/valgus and anterior/posterior balance throughout full range of motion. The patella is everted and thickness measured to be 22  mm. Free hand resection is taken to 12 mm, a 35 template is placed, lug holes are  drilled, trial patella is placed, and it tracks normally. Osteophytes are removed off the posterior femur with the trial in place. All trials are removed and the cut bone surfaces prepared with pulsatile lavage. Cement is mixed and once ready for implantation, the size 3 tibial implant, size  3 posterior stabilized femoral component, and the size 35 patella are cemented in place and the patella is held with the clamp. The trial insert is placed and the knee held in full extension.  All extruded cement is removed and once the cement is hard the permanent 10 mm posterior stabilized rotating platform insert is placed into the tibial tray.      The wound is copiously irrigated with saline solution and the extensor mechanism closed over a hemovac drain with #1 V-loc suture. The tourniquet is released for a total tourniquet time of 35  minutes. Flexion against gravity is 140 degrees and the patella tracks normally. Subcutaneous tissue is closed with 2.0 vicryl and subcuticular with running 4.0 Monocryl. The incision is cleaned and dried and steri-strips and a bulky sterile dressing are applied. The limb is placed into a knee immobilizer and the patient is awakened and transported to recovery in stable condition.      Please note that a surgical assistant was a medical necessity for this procedure in order to perform it in a safe and expeditious manner. Surgical assistant was necessary to retract the ligaments and vital neurovascular structures to prevent injury to them and also necessary for proper positioning of the limb to allow for anatomic placement of the prosthesis.   Alexa Woods Alexa Knieriem, MD    12/06/2017, 2:17 PM

## 2017-12-07 ENCOUNTER — Encounter (HOSPITAL_COMMUNITY): Payer: Self-pay | Admitting: Orthopedic Surgery

## 2017-12-07 LAB — BASIC METABOLIC PANEL
Anion gap: 11 (ref 5–15)
BUN: 14 mg/dL (ref 6–20)
CHLORIDE: 101 mmol/L (ref 98–111)
CO2: 27 mmol/L (ref 22–32)
Calcium: 8.6 mg/dL — ABNORMAL LOW (ref 8.9–10.3)
Creatinine, Ser: 1.03 mg/dL — ABNORMAL HIGH (ref 0.44–1.00)
GFR calc Af Amer: >60 mL/min (ref >60)
GFR calc non Af Amer: >60 mL/min (ref >60)
Glucose, Bld: 329 mg/dL — ABNORMAL HIGH (ref 70–99)
POTASSIUM: 3.9 mmol/L (ref 3.5–5.1)
SODIUM: 139 mmol/L (ref 135–145)

## 2017-12-07 LAB — CBC
HEMATOCRIT: 38 % (ref 36.0–46.0)
HEMOGLOBIN: 12.5 g/dL (ref 12.0–15.0)
MCH: 27.7 pg (ref 26.0–34.0)
MCHC: 32.9 g/dL (ref 30.0–36.0)
MCV: 84.1 fL (ref 78.0–100.0)
Platelets: 270 10*3/uL (ref 150–400)
RBC: 4.52 MIL/uL (ref 3.87–5.11)
RDW: 13.5 % (ref 11.5–15.5)
WBC: 10.2 10*3/uL (ref 4.0–10.5)

## 2017-12-07 LAB — GLUCOSE, CAPILLARY
GLUCOSE-CAPILLARY: 293 mg/dL — AB (ref 70–99)
GLUCOSE-CAPILLARY: 288 mg/dL — AB (ref 70–99)
Glucose-Capillary: 258 mg/dL — ABNORMAL HIGH (ref 70–99)
Glucose-Capillary: 308 mg/dL — ABNORMAL HIGH (ref 70–99)

## 2017-12-07 MED ORDER — BISACODYL 10 MG RE SUPP: 10.0000 mg | Freq: Every day | RECTAL | 0 refills | Status: DC | PRN

## 2017-12-07 MED ORDER — LABETALOL HCL 5 MG/ML IV SOLN: 10.0000 mg | INTRAVENOUS | 0 refills | Status: DC | PRN

## 2017-12-07 MED ORDER — ONDANSETRON HCL 4 MG PO TABS: 4.0000 mg | ORAL_TABLET | Freq: Four times a day (QID) | ORAL | 0 refills | Status: DC | PRN

## 2017-12-07 NOTE — Care Management Note (Signed)
Case Management Note  Patient Details  Name: CALLEN ZUBA MRN: [id number] Date of Birth: [birth year]  Subjective/Objective:     Pt admitted with OA of knee               Action/Plan: Plan to discharge to SNF for Rehab.  There are no DME needs at present time.   Expected Discharge Date:                  Expected Discharge Plan:     In-House Referral:     Discharge planning Services     Post Acute Care Choice:    Choice offered to:     DME Arranged:    DME Agency:     HH Arranged:    HH Agency:     Status of Service:     If discussed at [employer], dates discussed:    Additional CommentsPurcell Mouton, RN 12/07/2017, 1:30 PM

## 2017-12-07 NOTE — NC FL2 (Addendum)
[place] LEVEL OF CARE SCREENING TOOL     IDENTIFICATION  Patient Name: Alexa Woods Birthdate: [birth year] Sex: female Admission Date (Current Location): 12/06/2017  [facility] and [place] Number:  Herbalist and Address:  [facility],  [place] [address] [place]      Provider Number: [id number]  Attending Physician Name and Address:  Gaynelle Arabian, MD  Relative Name and Phone Number:       Current Level of Care: Hospital Recommended Level of Care: [place] Prior Approval Number:    Date Approved/Denied:   PASRR Number:   [id number]  Discharge Plan: SNF    Current Diagnoses: Patient Active Problem List   Diagnosis Date Noted  . OA (osteoarthritis) of knee 12/06/2017  . Pre-operative exam 04/05/2017  . Class 3 obesity with serious comorbidity and body mass index (BMI) of 40.0 to 44.9 in adult 01/21/2017  . Dysuria 08/02/2015  . LUQ abdominal pain 07/19/2014  . Joint swelling 12/20/2013  . Leg pain, bilateral 11/28/2013  . Periodontal disease 02/03/2013  . Hyperlipidemia, mild 09/27/2012  . Rash of hands 05/23/2012  . 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 DYSPHAGIA UNSPECIFIED 08/07/2010  . Low back pain 07/22/2010  . Morbid obesity ([place]) 03/30/2008  . 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  . Type 2 diabetes mellitus with peripheral neuropathy ([place]) 11/30/2006  . ANXIETY 11/30/2006  . History of alcohol abuse 11/30/2006  . Depression with anxiety 11/30/2006  . Essential hypertension 11/30/2006  . HEMORRHOIDS 11/30/2006  . ALLERGIC RHINITIS 11/30/2006  . Asthma 11/30/2006  . GERD 11/30/2006  . CYSTITIS, CHRONIC 11/30/2006  . PSORIASIS 11/30/2006    Orientation RESPIRATION BLADDER Height & Weight     Self, Time, Situation, Place  Normal Continent Weight: 257 lb (116.6 kg) Height:  5\' 6"  (167.6 cm)  BEHAVIORAL SYMPTOMS/MOOD NEUROLOGICAL BOWEL NUTRITION STATUS      Continent Diet(See  discharge summary )  AMBULATORY STATUS COMMUNICATION OF NEEDS Skin   Extensive Assist Verbally Surgical wounds                       Personal Care Assistance Level of Assistance  Bathing, Feeding, Dressing Bathing Assistance: Limited assistance Feeding assistance: Independent Dressing Assistance: Limited assistance     Functional Limitations Info  Sight, Hearing, Speech Sight Info: Adequate Hearing Info: Adequate Speech Info: Adequate    SPECIAL CARE FACTORS FREQUENCY  PT (By licensed PT), OT (By licensed OT)     PT Frequency: 7x/week  OT Frequency: 7x/week            Contractures Contractures Info: Not present    Additional Factors Info  Code Status, Allergies, Psychotropic, Insulin Sliding Scale Code Status Info: Fullcode Allergies Info: Bupropion Hcl, Cefuroxime Axetil, Darvon Propoxyphene, Gabapentin, Lidocaine, Metformin, Paroxetine, Tramadol Hcl, Codeine, Sulfa Antibiotics Psychotropic Info: Zoloft, Ambien  Insulin Sliding Scale Info: Yes, see d/c summary        Current Medications (12/07/2017):  This is the current hospital active medication list Current Facility-Administered Medications  Medication Dose Route Frequency Provider Last Rate Last Dose  . 0.9 %  sodium chloride infusion   Intravenous Continuous Gaynelle Arabian, MD 75 mL/hr at 12/07/17 6767    . acetaminophen (TYLENOL) tablet 1,000 mg  1,000 mg Oral Q6H Aluisio, Pilar Plate, MD   1,000 mg at 12/07/17 0538  . albuterol (PROVENTIL) (2.5 MG/3ML) 0.083% nebulizer solution 3 mL  3 mL Inhalation Q6H PRN Gaynelle Arabian, MD      .  aspirin EC tablet 325 mg  325 mg Oral BID Gaynelle Arabian, MD   325 mg at 12/07/17 1009  . bisacodyl (DULCOLAX) suppository 10 mg  10 mg Rectal Daily PRN Gaynelle Arabian, MD      . budesonide (PULMICORT) nebulizer solution 0.25 mg  0.25 mg Nebulization BID Gaynelle Arabian, MD   0.25 mg at 12/07/17 0845  . diphenhydrAMINE (BENADRYL) 12.5 MG/5ML elixir 12.5-25 mg  12.5-25 mg Oral Q4H PRN  Aluisio, Pilar Plate, MD      . docusate sodium (COLACE) capsule 100 mg  100 mg Oral BID Gaynelle Arabian, MD   100 mg at 12/07/17 1010  . fluticasone (FLONASE) 50 MCG/ACT nasal spray 2 spray  2 spray Each Nare Daily PRN Aluisio, Pilar Plate, MD      . hydrochlorothiazide (HYDRODIURIL) tablet 25 mg  25 mg Oral Daily Gaynelle Arabian, MD   25 mg at 12/07/17 1009  . HYDROmorphone (DILAUDID) injection 1-2 mg  1-2 mg Intravenous Q2H PRN Danae Orleans, PA-C   2 mg at 12/07/17 0700  . insulin aspart (novoLOG) injection 0-15 Units  0-15 Units Subcutaneous TID WC Gaynelle Arabian, MD   8 Units at 12/07/17 [phone]  . insulin aspart (novoLOG) injection 0-5 Units  0-5 Units Subcutaneous QHS Danae Orleans, PA-C   5 Units at 12/06/17 2345  . insulin detemir (LEVEMIR) injection 50 Units  50 Units Subcutaneous QHS Gaynelle Arabian, MD   50 Units at 12/06/17 2346  . labetalol (NORMODYNE,TRANDATE) injection 10 mg  10 mg Intravenous Q2H PRN Danae Orleans, PA-C   10 mg at 12/06/17 2229  . LORazepam (ATIVAN) tablet 1 mg  1 mg Oral QID PRN Gaynelle Arabian, MD   1 mg at 12/06/17 1924  . menthol-cetylpyridinium (CEPACOL) lozenge 3 mg  1 lozenge Oral PRN Aluisio, Pilar Plate, MD       Or  . phenol (CHLORASEPTIC) mouth spray 1 spray  1 spray Mouth/Throat PRN Aluisio, Pilar Plate, MD      . methocarbamol (ROBAXIN) tablet 500 mg  500 mg Oral Q6H PRN Gaynelle Arabian, MD   500 mg at 12/07/17 0429   Or  . methocarbamol (ROBAXIN) 500 mg in dextrose 5 % 50 mL IVPB  500 mg Intravenous Q6H PRN Gaynelle Arabian, MD   Stopped at 12/06/17 1536  . metoCLOPramide (REGLAN) tablet 5-10 mg  5-10 mg Oral Q8H PRN Gaynelle Arabian, MD   10 mg at 12/07/17 0429   Or  . metoCLOPramide (REGLAN) injection 5-10 mg  5-10 mg Intravenous Q8H PRN Gaynelle Arabian, MD   10 mg at 12/06/17 2002  . ondansetron (ZOFRAN) tablet 4 mg  4 mg Oral Q6H PRN Gaynelle Arabian, MD   4 mg at 12/07/17 0538   Or  . ondansetron (ZOFRAN) injection 4 mg  4 mg Intravenous Q6H PRN Gaynelle Arabian, MD   4 mg at  12/07/17 1100  . oxyCODONE (Oxy IR/ROXICODONE) immediate release tablet 10-15 mg  10-15 mg Oral Q4H PRN Gaynelle Arabian, MD   15 mg at 12/07/17 0429  . oxyCODONE (Oxy IR/ROXICODONE) immediate release tablet 5-10 mg  5-10 mg Oral Q4H PRN Aluisio, Pilar Plate, MD      . pantoprazole (PROTONIX) EC tablet 40 mg  40 mg Oral q morning - 10a Gaynelle Arabian, MD   40 mg at 12/07/17 1009  . perphenazine (TRILAFON) tablet 2 mg  2 mg Oral QHS Gaynelle Arabian, MD   2 mg at 12/06/17 2114  . polyethylene glycol (MIRALAX / GLYCOLAX) packet 17 g  17 g Oral  Daily PRN Gaynelle Arabian, MD      . potassium chloride (K-DUR,KLOR-CON) CR tablet 10 mEq  10 mEq Oral q morning - 10a Gaynelle Arabian, MD   10 mEq at 12/07/17 1009  . sertraline (ZOLOFT) tablet 200 mg  200 mg Oral q morning - 10a Gaynelle Arabian, MD   200 mg at 12/07/17 1010  . sodium phosphate (FLEET) 7-19 GM/118ML enema 1 enema  1 enema Rectal Once PRN Aluisio, Pilar Plate, MD      . zolpidem Lorrin Mais) tablet 5 mg  5 mg Oral QHS Gaynelle Arabian, MD   5 mg at 12/06/17 2114     Discharge Medications: Please see discharge summary for a list of discharge medications.  Relevant Imaging Results:  Relevant Lab Results:   Additional Information KIS:[id number]  Lia Hopping, LCSW

## 2017-12-07 NOTE — Progress Notes (Signed)
PT Cancellation Note  Patient Details Name: Alexa Woods MRN: [id number] DOB: 1961/05/31   Cancelled Treatment:    Reason Eval/Treat Not Completed: Medical issues which prohibited therapy--N/V this a.m. RN recommended PT check back this afternoon.    Weston Anna, MPT Pager: [phone]

## 2017-12-07 NOTE — Clinical Social Work Note (Addendum)
Clinical Social Work Assessment  Patient Details  Name: Alexa Woods MRN: [id number] Date of Birth: 07-30-1960  Date of referral:  12/07/17               Reason for consult:  Discharge Planning                Permission sought to share information with:  Facility Art therapist granted to share information::  Yes, Verbal Permission Granted  Name::        Agency::  SNF  Relationship::     Contact Information:     Housing/Transportation Living arrangements for the past 2 months:  Single Family Home Source of Information:  Patient Patient Interpreter Needed:  None Criminal Activity/Legal Involvement Pertinent to Current Situation/Hospitalization:  No - Comment as needed Significant Relationships:  Spouse Lives with:  Adult Children Do you feel safe going back to the place where you live?  Yes Need for family participation in patient care:  Yes (Comment)  Care giving concerns:   SNF placement for short term rehab.   Social Worker assessment / plan:  CSW met with patient at bedside to discuss discharge planning.   Patient states she lives at home with her spouse and adult children. She states her family will not be able to provide one on one care and support for her while at home, patient is agreeable to SNF. Patient reports she was fairly independent prior to this hospitalization.  Patient reports she went to SNF in 2011 and had a good experience. Patient is hopeful this time will be a similar experience. Patient will need [employer] authorization prior to discharge.   [facility] has initiated.   FL2 done.   Plan: SNF   Employment status:  Disabled (Comment on whether or not currently receiving Disability) Insurance information:  Medicare(CIGNA) PT Recommendations:  [place] / Referral to community resources:  [place]  Patient/Family's Response to care:  Agreeable and Responding well to care.    Patient/Family's Understanding of and Emotional Response to Diagnosis, Current Treatment, and Prognosis:  Patient reports he spouse in not very involved in her care due to his own health issues. Patient is alert x4 and has a good understanding of her diagnosis and follow up treatment.   Emotional Assessment Appearance:  Appears stated age Attitude/Demeanor/Rapport:    Affect (typically observed):  Accepting, Pleasant Orientation:  Oriented to Self, Oriented to Place, Oriented to  Time, Oriented to Situation Alcohol / Substance use:  Not Applicable Psych involvement (Current and /or in the community):     Discharge Needs  Concerns to be addressed:  Decision making concerns Readmission within the last 30 days:  No Current discharge risk:  Dependent with Mobility Barriers to Discharge:  Continued Medical Work up, [place], LCSW 12/07/2017, 2:04 PM

## 2017-12-07 NOTE — Plan of Care (Signed)
Plan of care discussed.   

## 2017-12-07 NOTE — Evaluation (Signed)
Physical Therapy Evaluation Patient Details Name: Alexa Woods MRN: [id number] DOB: 07/26/1960 Today's Date: 12/07/2017   History of Present Illness  57 yo female s/p L TKA 12/06/17  Clinical Impression  On eval, pt required Mod assist to stand and Min assist +2 for ambulation. She walked ~25 feet with a RW. Pt presents with general weakness, decreased activity tolerance, and impaired gait and balance. Moderate pain with activity. Recommend ST rehab at SNF to improve overall functional mobility and to regain independence prior to returning home. Will follow and progress activity as tolerated.     Follow Up Recommendations Follow surgeon's recommendation for DC plan and follow-up therapies(SNF)    Equipment Recommendations  None recommended by PT    Recommendations for Other Services       Precautions / Restrictions Precautions Precautions: Fall Required Braces or Orthoses: Knee Immobilizer - Left Knee Immobilizer - Left: Discontinue once straight leg raise with < 10 degree lag Restrictions Weight Bearing Restrictions: No Other Position/Activity Restrictions: WBAT      Mobility  Bed Mobility Overal bed mobility: Needs Assistance Bed Mobility: Supine to Sit     Supine to sit: Min assist;HOB elevated     General bed mobility comments: Increased time. Assist for L LE. VCS safety, technique.   Transfers Overall transfer level: Needs assistance Equipment used: Rolling walker (2 wheeled) Transfers: Sit to/from Stand Sit to Stand: Mod assist;From elevated surface         General transfer comment: Assist to rise, stabilize, control descent. Skilled VCs safety, technique, hand/LE placement. Increased time.   Ambulation/Gait Ambulation/Gait assistance: Min assist;+2 safety/equipment;+2 physical assistance Gait Distance (Feet): 25 Feet Assistive device: Rolling walker (2 wheeled) Gait Pattern/deviations: Step-to pattern     General Gait Details: Skilled VCS safety,  technique, sequence, posture. Assist to stabilize pt throughout distance. Followed closely with recliner. distance limited by pain, fatigue.   Stairs            Wheelchair Mobility    Modified Rankin (Stroke Patients Only)       Balance Overall balance assessment: Needs assistance         Standing balance support: Bilateral upper extremity supported Standing balance-Leahy Scale: Poor                               Pertinent Vitals/Pain Pain Assessment: 0-10 Pain Score: 7  Pain Location: L knee Pain Descriptors / Indicators: Sore;Aching;Sharp Pain Intervention(s): Monitored during session;Limited activity within patient's tolerance;Ice applied;Repositioned    Home Living Family/patient expects to be discharged to:: Skilled nursing facility                      Prior Function Level of Independence: Independent               Hand Dominance        Extremity/Trunk Assessment   Upper Extremity Assessment Upper Extremity Assessment: Overall WFL for tasks assessed    Lower Extremity Assessment Lower Extremity Assessment: Generalized weakness    Cervical / Trunk Assessment Cervical / Trunk Assessment: Normal  Communication   Communication: No difficulties  Cognition Arousal/Alertness: Awake/alert Behavior During Therapy: WFL for tasks assessed/performed Overall Cognitive Status: Within Functional Limits for tasks assessed  General Comments      Exercises Total Joint Exercises Ankle Circles/Pumps: AROM;Both;10 reps;Supine Quad Sets: AROM;Both;10 reps;Supine Heel Slides: AAROM;Left;10 reps;Supine Hip ABduction/ADduction: AAROM;Left;10 reps;Supine Straight Leg Raises: AAROM;Left;10 reps;Supine Goniometric ROM: ~10-50 degrees   Assessment/Plan    PT Assessment Patient needs continued PT services  PT Problem List Decreased strength;Decreased range of motion;Decreased  balance;Decreased mobility;Pain;Decreased activity tolerance;Decreased knowledge of use of DME       PT Treatment Interventions DME instruction;Gait training;Therapeutic activities;Functional mobility training;Balance training;Patient/family education;Stair training;Therapeutic exercise    PT Goals (Current goals can be found in the Care Plan section)  Acute Rehab PT Goals Patient Stated Goal: rehab PT Goal Formulation: With patient Time For Goal Achievement: 12/21/17 Potential to Achieve Goals: Good    Frequency 7X/week   Barriers to discharge        Co-evaluation               AM-PAC PT "6 Clicks" Daily Activity  Outcome Measure Difficulty turning over in bed (including adjusting bedclothes, sheets and blankets)?: A Lot Difficulty moving from lying on back to sitting on the side of the bed? : Unable Difficulty sitting down on and standing up from a chair with arms (e.g., wheelchair, bedside commode, etc,.)?: Unable Help needed moving to and from a bed to chair (including a wheelchair)?: A Lot Help needed walking in hospital room?: A Lot Help needed climbing 3-5 steps with a railing? : Total 6 Click Score: 9    End of Session Equipment Utilized During Treatment: Gait belt;Left knee immobilizer Activity Tolerance: Patient limited by fatigue;Patient limited by pain Patient left: in chair;with call bell/phone within reach;with family/visitor present   PT Visit Diagnosis: Difficulty in walking, not elsewhere classified (R26.2);Other abnormalities of gait and mobility (R26.89);Pain Pain - Right/Left: Left Pain - part of body: Knee    Time: 6256-3893 PT Time Calculation (min) (ACUTE ONLY): 27 min   Charges:   PT Evaluation $PT Eval Low Complexity: 1 Low PT Treatments $Gait Training: 8-22 mins   PT G Codes:          Weston Anna, MPT Pager: [phone]

## 2017-12-07 NOTE — Progress Notes (Signed)
Subjective: 1 Day Post-Op Procedure(s) (LRB): LEFT TOTAL LEFT KNEE ARTHROPLASTY (Left) Patient reports pain as moderate.   Patient seen in rounds by Dr. Wynelle Link. Patient is well, and has had no acute complaints or problems other than pain in the left knee. Denies chest pain, SOB or calf pain. Foley catheter removed this AM. We will start therapy today.   Objective: Vital signs in last 24 hours: Temp:  [97.7 F (36.5 C)-98.6 F (37 C)] 98.4 F (36.9 C) (07/09 2229) Pulse Rate:  [25-93] 74 (07/09 0614) Resp:  [9-20] 19 (07/09 0224) BP: (134-187)/(70-98) 161/84 (07/09 0614) SpO2:  [84 %-100 %] 100 % (07/09 0614) Weight:  [116.6 kg (257 lb)] 116.6 kg (257 lb) (07/08 1054)  Intake/Output from previous day:  Intake/Output Summary (Last 24 hours) at 12/07/2017 0755 Last data filed at 12/07/2017 0600 Gross per 24 hour  Intake 3581.58 ml  Output 3803 ml  Net -221.42 ml    Labs: Recent Labs    12/07/17 0517  HGB 12.5   Recent Labs    12/07/17 0517  WBC 10.2  RBC 4.52  HCT 38.0  PLT 270   Recent Labs    12/07/17 0517  NA 139  K 3.9  CL 101  CO2 27  BUN 14  CREATININE 1.03*  GLUCOSE 329*  CALCIUM 8.6*   Exam: General - Patient is Alert and Oriented Extremity - Neurologically intact Neurovascular intact Sensation intact distally Dorsiflexion/Plantar flexion intact Dressing - dressing C/D/I Motor Function - intact, moving foot and toes well on exam.   Past Medical History:  Diagnosis Date  . Alcohol abuse, in remission    since 1998  . Allergic rhinitis   . Anxiety   . Asthma   . Bilateral lower extremity edema   . Bulging lumbar disc   . Bulging of cervical intervertebral disc   . Chronic constipation   . DDD (degenerative disc disease), lumbosacral   . Depression   . Dyspnea   . GERD (gastroesophageal reflux disease)   . Hemorrhoids   . History of Bell's palsy 08/2007   left  . History of chronic cystitis   . IBS (irritable bowel syndrome)   .  Insulin dependent type 2 diabetes mellitus [facility])    endocrinologist-- dr Cruzita Lederer  . Lower urinary tract symptoms (LUTS)   . OA (osteoarthritis)    knees, back, hands, elbows  . OSA (obstructive sleep apnea)    per study 06-08-2017 mild osa , cpap recommended , per pt insurance issue  . Peripheral neuropathy   . PONV (postoperative nausea and vomiting)   . Psoriasis   . S/P dilatation of esophageal stricture   . Unspecified essential hypertension   . Wears partial dentures    lower    Assessment/Plan: 1 Day Post-Op Procedure(s) (LRB): LEFT TOTAL LEFT KNEE ARTHROPLASTY (Left) Principal Problem:   OA (osteoarthritis) of knee  Estimated body mass index is 41.48 kg/m as calculated from the following:   Height as of this encounter: 5\' 6"  (1.676 m).   Weight as of this encounter: 116.6 kg (257 lb). Advance diet Up with therapy  Anticipated LOS equal to or greater than 2 midnights due to - Age 30 and older with one or more of the following:  - Obesity  - Expected need for hospital services (PT, OT, Nursing) required for safe  discharge  - Anticipated need for postoperative skilled nursing care or inpatient rehab  - Active co-morbidities: Chronic pain requiring opiods and Diabetes OR   -  Unanticipated findings during/Post Surgery: None  - Patient is a high risk of re-admission due to: None    DVT Prophylaxis - Aspirin Weight bearing as tolerated. D/C O2 and pulse ox and try on room air. Hemovac pulled without difficulty, will begin therapy today.  Plan is to go Skilled nursing facility after hospital stay.   Theresa Duty, PA-C Orthopedic Surgery 12/07/2017, 7:56 AM

## 2017-12-08 LAB — GLUCOSE, CAPILLARY
GLUCOSE-CAPILLARY: 304 mg/dL — AB (ref 70–99)
GLUCOSE-CAPILLARY: 305 mg/dL — AB (ref 70–99)
Glucose-Capillary: 269 mg/dL — ABNORMAL HIGH (ref 70–99)
Glucose-Capillary: 239 mg/dL — ABNORMAL HIGH (ref 70–99)
Glucose-Capillary: 281 mg/dL — ABNORMAL HIGH (ref 70–99)

## 2017-12-08 LAB — CBC
HCT: 38.2 % (ref 36.0–46.0)
Hemoglobin: 12.3 g/dL (ref 12.0–15.0)
MCH: 27.3 pg (ref 26.0–34.0)
MCHC: 32.2 g/dL (ref 30.0–36.0)
MCV: 84.9 fL (ref 78.0–100.0)
PLATELETS: 346 10*3/uL (ref 150–400)
RBC: 4.5 MIL/uL (ref 3.87–5.11)
RDW: 13.9 % (ref 11.5–15.5)
WBC: 12.3 10*3/uL — AB (ref 4.0–10.5)

## 2017-12-08 LAB — BASIC METABOLIC PANEL
ANION GAP: 11 (ref 5–15)
BUN: 16 mg/dL (ref 6–20)
CALCIUM: 9.2 mg/dL (ref 8.9–10.3)
CO2: 29 mmol/L (ref 22–32)
Chloride: 100 mmol/L (ref 98–111)
Creatinine, Ser: 0.98 mg/dL (ref 0.44–1.00)
GLUCOSE: 282 mg/dL — AB (ref 70–99)
POTASSIUM: 3.8 mmol/L (ref 3.5–5.1)
SODIUM: 140 mmol/L (ref 135–145)

## 2017-12-08 MED ORDER — BUPIVACAINE IN DEXTROSE 0.75-8.25 % IT SOLN
INTRATHECAL | Status: DC | PRN
  Administered 2017-12-06: 2 mL via INTRATHECAL

## 2017-12-08 NOTE — Addendum Note (Signed)
Addendum  created 12/08/17 0554 by Lillia Abed, MD   Child order released for a procedure order, Intraprocedure Blocks edited, Intraprocedure Meds edited, Sign clinical note

## 2017-12-08 NOTE — Progress Notes (Signed)
Physical Therapy Treatment Patient Details Name: Alexa Woods MRN: [id number] DOB: 1961/02/21 Today's Date: 12/08/2017    History of Present Illness 57 yo female s/p L TKA 12/06/17    PT Comments    Progressing with mobility.    Follow Up Recommendations  Follow surgeon's recommendation for DC plan and follow-up therapies(SNF)     Equipment Recommendations  None recommended by PT    Recommendations for Other Services       Precautions / Restrictions Precautions Precautions: Fall Required Braces or Orthoses: Knee Immobilizer - Left Knee Immobilizer - Left: Discontinue once straight leg raise with < 10 degree lag Restrictions Weight Bearing Restrictions: No Other Position/Activity Restrictions: WBAT    Mobility  Bed Mobility Overal bed mobility: Needs Assistance Bed Mobility: Supine to Sit;Sit to Supine     Supine to sit: Min assist;HOB elevated Sit to supine: Min assist;HOB elevated   General bed mobility comments: Increased time. Assist for L LE. VCS safety, technique.   Transfers Overall transfer level: Needs assistance Equipment used: Rolling walker (2 wheeled) Transfers: Sit to/from Stand Sit to Stand: Min assist         General transfer comment: Assist to rise, stabilize, control descent. Skilled VCs safety, technique, hand/LE placement. Increased time.   Ambulation/Gait Ambulation/Gait assistance: Min assist Gait Distance (Feet): 50 Feet Assistive device: Rolling walker (2 wheeled) Gait Pattern/deviations: Step-to pattern;Trunk flexed;Antalgic     General Gait Details: Skilled VCS safety, technique, sequence, posture. Assist to stabilize pt throughout distance.    Stairs             Wheelchair Mobility    Modified Rankin (Stroke Patients Only)       Balance                                            Cognition Arousal/Alertness: Awake/alert Behavior During Therapy: WFL for tasks assessed/performed Overall  Cognitive Status: Within Functional Limits for tasks assessed                                        Exercises      General Comments        Pertinent Vitals/Pain Pain Assessment: 0-10 Pain Score: 8  Pain Location: L knee Pain Descriptors / Indicators: Sore;Aching;Sharp Pain Intervention(s): Monitored during session;Repositioned    Home Living                      Prior Function            PT Goals (current goals can now be found in the care plan section) Progress towards PT goals: Progressing toward goals    Frequency    7X/week      PT Plan Current plan remains appropriate    Co-evaluation              AM-PAC PT "6 Clicks" Daily Activity  Outcome Measure  Difficulty turning over in bed (including adjusting bedclothes, sheets and blankets)?: A Lot Difficulty moving from lying on back to sitting on the side of the bed? : Unable Difficulty sitting down on and standing up from a chair with arms (e.g., wheelchair, bedside commode, etc,.)?: Unable Help needed moving to and from a bed to chair (including a wheelchair)?: A Little Help needed  walking in hospital room?: A Little Help needed climbing 3-5 steps with a railing? : A Lot 6 Click Score: 12    End of Session Equipment Utilized During Treatment: Gait belt;Left knee immobilizer Activity Tolerance: Patient limited by pain Patient left: in chair;with call bell/phone within reach   PT Visit Diagnosis: Difficulty in walking, not elsewhere classified (R26.2);Other abnormalities of gait and mobility (R26.89);Pain Pain - Right/Left: Left Pain - part of body: Knee     Time: 1353-1403 PT Time Calculation (min) (ACUTE ONLY): 10 min  Charges:  $Gait Training: 8-22 mins                    G Codes:         Weston Anna, MPT Pager: [phone]

## 2017-12-08 NOTE — Progress Notes (Signed)
LCSW spoke with [employer] Rep.   Patient would like [facility]. According to [facility] is out of network and patient would have a 50% copay. [employer] emailed a list of in network SNF facilities to [facility]   LCSW provided patient with list of in network SNF facilities. Patient will make new choice.   LCSW will continue to follow for dc needs.   Carolin Coy [place]

## 2017-12-08 NOTE — Progress Notes (Signed)
Physical Therapy Treatment Patient Details Name: Alexa Woods MRN: [id number] DOB: 02-26-1961 Today's Date: 12/08/2017    History of Present Illness 57 yo female s/p L TKA 12/06/17    PT Comments    Progressing with mobility. ROM and ambulation distance limited by pain.    Follow Up Recommendations  Follow surgeon's recommendation for DC plan and follow-up therapies(SNF)     Equipment Recommendations  None recommended by PT    Recommendations for Other Services       Precautions / Restrictions Precautions Precautions: Fall Required Braces or Orthoses: Knee Immobilizer - Left Knee Immobilizer - Left: Discontinue once straight leg raise with < 10 degree lag Restrictions Weight Bearing Restrictions: No Other Position/Activity Restrictions: WBAT    Mobility  Bed Mobility Overal bed mobility: Needs Assistance Bed Mobility: Supine to Sit     Supine to sit: Min assist;HOB elevated     General bed mobility comments: Increased time. Assist for L LE. VCS safety, technique.   Transfers Overall transfer level: Needs assistance Equipment used: Rolling walker (2 wheeled) Transfers: Sit to/from Stand Sit to Stand: Min assist;From elevated surface         General transfer comment: Assist to rise, stabilize, control descent. Skilled VCs safety, technique, hand/LE placement. Increased time.   Ambulation/Gait Ambulation/Gait assistance: Min assist Gait Distance (Feet): 40 Feet Assistive device: Rolling walker (2 wheeled) Gait Pattern/deviations: Step-to pattern;Trunk flexed     General Gait Details: Skilled VCS safety, technique, sequence, posture. Assist to stabilize pt throughout distance.    Stairs             Wheelchair Mobility    Modified Rankin (Stroke Patients Only)       Balance                                            Cognition Arousal/Alertness: Awake/alert Behavior During Therapy: WFL for tasks  assessed/performed Overall Cognitive Status: Within Functional Limits for tasks assessed                                        Exercises Total Joint Exercises Ankle Circles/Pumps: AROM;Both;10 reps;Supine Quad Sets: AROM;Both;10 reps;Supine Heel Slides: AAROM;Left;Supine;5 reps Hip ABduction/ADduction: AAROM;Left;Supine;5 reps Straight Leg Raises: AAROM;Left;Supine;5 reps Goniometric ROM: ~10-50 degrees    General Comments        Pertinent Vitals/Pain Pain Assessment: 0-10 Pain Score: 8  Pain Location: L knee Pain Descriptors / Indicators: Sore;Aching;Sharp Pain Intervention(s): Limited activity within patient's tolerance;Repositioned;Ice applied;RN gave pain meds during session    Home Living                      Prior Function            PT Goals (current goals can now be found in the care plan section) Progress towards PT goals: Progressing toward goals    Frequency    7X/week      PT Plan Current plan remains appropriate    Co-evaluation              AM-PAC PT "6 Clicks" Daily Activity  Outcome Measure  Difficulty turning over in bed (including adjusting bedclothes, sheets and blankets)?: A Lot Difficulty moving from lying on back to sitting on the side of the bed? :  Unable Difficulty sitting down on and standing up from a chair with arms (e.g., wheelchair, bedside commode, etc,.)?: Unable Help needed moving to and from a bed to chair (including a wheelchair)?: A Little Help needed walking in hospital room?: A Little Help needed climbing 3-5 steps with a railing? : A Lot 6 Click Score: 12    End of Session Equipment Utilized During Treatment: Gait belt;Left knee immobilizer Activity Tolerance: Patient limited by pain Patient left: in chair;with call bell/phone within reach   PT Visit Diagnosis: Difficulty in walking, not elsewhere classified (R26.2);Other abnormalities of gait and mobility (R26.89);Pain Pain -  Right/Left: Left Pain - part of body: Knee     Time: 1155-2080 PT Time Calculation (min) (ACUTE ONLY): 34 min  Charges:  $Gait Training: 8-22 mins $Therapeutic Exercise: 8-22 mins                    G Codes:          Weston Anna, MPT Pager: [phone]

## 2017-12-08 NOTE — Progress Notes (Signed)
Inpatient Diabetes Program Recommendations  AACE/ADA: New Consensus Statement on Inpatient Glycemic Control (2015)  Target Ranges:  Prepandial:   less than 140 mg/dL      Peak postprandial:   less than 180 mg/dL (1-2 hours)      Critically ill patients:  140 - 180 mg/dL   Lab Results  Component Value Date   GLUCAP 239 (H) 12/08/2017   HGBA1C 8.9 (H) 11/30/2017    Review of Glycemic Control  Diabetes history: DM2 Outpatient Diabetes medications: Lantus 50 units QHS, Humulin R 20-15-15 units tidwc Current orders for Inpatient glycemic control: Lantus 50 units QHS, Novolog 0-15 units tidwc and hs  HgbA1C - 8.9% - needs tighter control  Inpatient Diabetes Program Recommendations:     Increase Lantus to 55 units QHS Add meal coverage insulin - Novolog 5 units tidwc  Continue to follow. Will d/c to SNF for Rehab.   Thank you. Lorenda Peck, RD, LDN, CDE Inpatient Diabetes Coordinator [phone]

## 2017-12-08 NOTE — Progress Notes (Cosign Needed)
   Subjective: 2 Days Post-Op Procedure(s) (LRB): LEFT TOTAL LEFT KNEE ARTHROPLASTY (Left) Patient reports pain as mild.   Patient seen in rounds with Dr. Wynelle Link. Patient is well, and has had no acute complaints or problems other than pain in the left knee. Nausea is improved this AM. Denies chest pain, SOB, or calf pain. Voiding without difficulty. Plan is to go Skilled nursing facility after hospital stay.  Objective: Vital signs in last 24 hours: Temp:  [98.3 F (36.8 C)-99.1 F (37.3 C)] 98.8 F (37.1 C) (07/10 0614) Pulse Rate:  [74-94] 94 (07/10 0614) Resp:  [16-20] 18 (07/10 0614) BP: (153-179)/(73-94) 153/86 (07/10 0614) SpO2:  [94 %-99 %] 97 % (07/10 0614)  Intake/Output from previous day:  Intake/Output Summary (Last 24 hours) at 12/08/2017 0741 Last data filed at 12/08/2017 0500 Gross per 24 hour  Intake 1635 ml  Output 2900 ml  Net -1265 ml    Labs: Recent Labs    12/07/17 0517 12/08/17 0557  HGB 12.5 12.3   Recent Labs    12/07/17 0517 12/08/17 0557  WBC 10.2 12.3*  RBC 4.52 4.50  HCT 38.0 38.2  PLT 270 346   Recent Labs    12/07/17 0517 12/08/17 0557  NA 139 140  K 3.9 3.8  CL 101 100  CO2 27 29  BUN 14 16  CREATININE 1.03* 0.98  GLUCOSE 329* 282*  CALCIUM 8.6* 9.2   Exam: General - Patient is Alert and Oriented Extremity - Neurologically intact Neurovascular intact Sensation intact distally Dorsiflexion/Plantar flexion intact Dressing/Incision - clean, dry, no drainage Motor Function - intact, moving foot and toes well on exam.   Past Medical History:  Diagnosis Date  . Alcohol abuse, in remission    since 1998  . Allergic rhinitis   . Anxiety   . Asthma   . Bilateral lower extremity edema   . Bulging lumbar disc   . Bulging of cervical intervertebral disc   . Chronic constipation   . DDD (degenerative disc disease), lumbosacral   . Depression   . Dyspnea   . GERD (gastroesophageal reflux disease)   . Hemorrhoids   .  History of Bell's palsy 08/2007   left  . History of chronic cystitis   . IBS (irritable bowel syndrome)   . Insulin dependent type 2 diabetes mellitus [facility])    endocrinologist-- dr Cruzita Lederer  . Lower urinary tract symptoms (LUTS)   . OA (osteoarthritis)    knees, back, hands, elbows  . OSA (obstructive sleep apnea)    per study 06-08-2017 mild osa , cpap recommended , per pt insurance issue  . Peripheral neuropathy   . PONV (postoperative nausea and vomiting)   . Psoriasis   . S/P dilatation of esophageal stricture   . Unspecified essential hypertension   . Wears partial dentures    lower    Assessment/Plan: 2 Days Post-Op Procedure(s) (LRB): LEFT TOTAL LEFT KNEE ARTHROPLASTY (Left) Principal Problem:   OA (osteoarthritis) of knee  Estimated body mass index is 41.48 kg/m as calculated from the following:   Height as of this encounter: 5\' 6"  (1.676 m).   Weight as of this encounter: 116.6 kg (257 lb). Advance diet Up with therapy  DVT Prophylaxis - Aspirin Weight-bearing as tolerated  Plan for discharge to SNF, currently awaiting placement.  Theresa Duty, PA-C Orthopedic Surgery 12/08/2017, 7:41 AM

## 2017-12-08 NOTE — Anesthesia Procedure Notes (Signed)
Spinal  Patient location during procedure: OR Start time: 12/06/2017 1:18 PM End time: 12/06/2017 1:21 PM Staffing Anesthesiologist: Lillia Abed, MD Performed: anesthesiologist  Preanesthetic Checklist Completed: patient identified, surgical consent, pre-op evaluation, timeout performed, IV checked, risks and benefits discussed and monitors and equipment checked Spinal Block Patient position: sitting Prep: DuraPrep Patient monitoring: blood pressure, continuous pulse ox, cardiac monitor and heart rate Approach: right paramedian Location: L3-4 Injection technique: single-shot Needle Needle type: Pencan  Needle gauge: 24 G Needle length: 9 cm Needle insertion depth: 8 cm

## 2017-12-09 ENCOUNTER — Encounter
Admission: RE | Admit: 2017-12-09 | Payer: Managed Care, Other (non HMO) | Source: Ambulatory Visit | Admitting: Internal Medicine

## 2017-12-09 LAB — GLUCOSE, CAPILLARY
GLUCOSE-CAPILLARY: 208 mg/dL — AB (ref 70–99)
GLUCOSE-CAPILLARY: 213 mg/dL — AB (ref 70–99)

## 2017-12-09 LAB — CBC
HCT: 36.1 % (ref 36.0–46.0)
Hemoglobin: 11.3 g/dL — ABNORMAL LOW (ref 12.0–15.0)
MCH: 26.8 pg (ref 26.0–34.0)
MCHC: 31.3 g/dL (ref 30.0–36.0)
MCV: 85.7 fL (ref 78.0–100.0)
PLATELETS: 314 10*3/uL (ref 150–400)
RBC: 4.21 MIL/uL (ref 3.87–5.11)
RDW: 13.8 % (ref 11.5–15.5)
WBC: 9.5 10*3/uL (ref 4.0–10.5)

## 2017-12-09 MED ORDER — ASPIRIN 325 MG PO TBEC: 325.0000 mg | DELAYED_RELEASE_TABLET | Freq: Two times a day (BID) | ORAL | 0 refills | Status: DC

## 2017-12-09 MED ORDER — METHOCARBAMOL 500 MG PO TABS: 500.0000 mg | ORAL_TABLET | Freq: Four times a day (QID) | ORAL | 0 refills | Status: DC | PRN

## 2017-12-09 NOTE — Progress Notes (Signed)
Physical Therapy Treatment Patient Details Name: Alexa Woods MRN: [id number] DOB: December 31, 1960 Today's Date: 12/09/2017    History of Present Illness 57 yo female s/p L TKA 12/06/17    PT Comments    Progressing with mobility. Plan is for SNF possibly later today.    Follow Up Recommendations  Follow surgeon's recommendation for DC plan and follow-up therapies(SNF)     Equipment Recommendations       Recommendations for Other Services       Precautions / Restrictions Precautions Precautions: Fall Required Braces or Orthoses: Knee Immobilizer - Left Knee Immobilizer - Left: Discontinue once straight leg raise with < 10 degree lag Restrictions Weight Bearing Restrictions: No Other Position/Activity Restrictions: WBAT    Mobility  Bed Mobility               General bed mobility comments: oob in recliner  Transfers Overall transfer level: Needs assistance Equipment used: Rolling walker (2 wheeled) Transfers: Sit to/from Stand Sit to Stand: Min assist         General transfer comment: Assist to rise, stabilize, control descent. Skilled VCs safety, technique, hand/LE placement. Increased time.   Ambulation/Gait Ambulation/Gait assistance: Min assist Gait Distance (Feet): 55 Feet Assistive device: Rolling walker (2 wheeled) Gait Pattern/deviations: Step-to pattern;Trunk flexed;Antalgic     General Gait Details: Skilled VCS safety, technique, sequence, posture. Assist to stabilize pt throughout distance.    Stairs             Wheelchair Mobility    Modified Rankin (Stroke Patients Only)       Balance                                            Cognition Arousal/Alertness: Awake/alert Behavior During Therapy: WFL for tasks assessed/performed Overall Cognitive Status: Within Functional Limits for tasks assessed                                        Exercises Total Joint Exercises Ankle Circles/Pumps:  AROM;Both;10 reps;Supine Quad Sets: AROM;Both;10 reps;Supine Heel Slides: AAROM;Left;Supine;10 reps Hip ABduction/ADduction: AAROM;Left;Supine;10 reps Straight Leg Raises: AAROM;Left;Supine;10 reps Goniometric ROM: ~10-50 (limited by pain)    General Comments        Pertinent Vitals/Pain Pain Assessment: 0-10 Pain Score: 7  Pain Location: L knee Pain Descriptors / Indicators: Sore;Aching;Sharp Pain Intervention(s): Monitored during session;Repositioned;Ice applied    Home Living                      Prior Function            PT Goals (current goals can now be found in the care plan section) Progress towards PT goals: Progressing toward goals    Frequency    7X/week      PT Plan Current plan remains appropriate    Co-evaluation              AM-PAC PT "6 Clicks" Daily Activity  Outcome Measure  Difficulty turning over in bed (including adjusting bedclothes, sheets and blankets)?: A Lot Difficulty moving from lying on back to sitting on the side of the bed? : Unable Difficulty sitting down on and standing up from a chair with arms (e.g., wheelchair, bedside commode, etc,.)?: Unable Help needed moving to and from  a bed to chair (including a wheelchair)?: A Little Help needed walking in hospital room?: A Little Help needed climbing 3-5 steps with a railing? : A Lot 6 Click Score: 12    End of Session Equipment Utilized During Treatment: Gait belt;Left knee immobilizer Activity Tolerance: Patient limited by pain Patient left: in chair;with call bell/phone within reach   PT Visit Diagnosis: Difficulty in walking, not elsewhere classified (R26.2);Other abnormalities of gait and mobility (R26.89);Pain Pain - Right/Left: Left Pain - part of body: Knee     Time: 6333-5456 PT Time Calculation (min) (ACUTE ONLY): 20 min  Charges:  $Gait Training: 8-22 mins                    G Codes:         Weston Anna, MPT Pager: [phone]

## 2017-12-09 NOTE — Progress Notes (Signed)
SNF unable to accept pt at this time due to no BM in last 72 hrs. Pt given bisacodyl suppository. Pt reports she has a tendency toward constipation.

## 2017-12-09 NOTE — Discharge Summary (Signed)
Physician Discharge Summary   Patient ID: Alexa Woods MRN: [id number] DOB/AGE: [birth year] 57 y.o.  Admit date: 12/06/2017 Discharge date: 12/09/2017 (tentatively)  Primary Diagnosis: Osteoarthritis left knee  Admission Diagnoses:  Past Medical History:  Diagnosis Date  . Alcohol abuse, in remission    since 1998  . Allergic rhinitis   . Anxiety   . Asthma   . Bilateral lower extremity edema   . Bulging lumbar disc   . Bulging of cervical intervertebral disc   . Chronic constipation   . DDD (degenerative disc disease), lumbosacral   . Depression   . Dyspnea   . GERD (gastroesophageal reflux disease)   . Hemorrhoids   . History of Bell's palsy 08/2007   left  . History of chronic cystitis   . IBS (irritable bowel syndrome)   . Insulin dependent type 2 diabetes mellitus [facility])    endocrinologist-- dr Cruzita Lederer  . Lower urinary tract symptoms (LUTS)   . OA (osteoarthritis)    knees, back, hands, elbows  . OSA (obstructive sleep apnea)    per study 06-08-2017 mild osa , cpap recommended , per pt insurance issue  . Peripheral neuropathy   . PONV (postoperative nausea and vomiting)   . Psoriasis   . S/P dilatation of esophageal stricture   . Unspecified essential hypertension   . Wears partial dentures    lower   Discharge Diagnoses:   Principal Problem:   OA (osteoarthritis) of knee  Estimated body mass index is 41.48 kg/m as calculated from the following:   Height as of this encounter: 5' 6"  (1.676 m).   Weight as of this encounter: 116.6 kg (257 lb).  Procedure:  Procedure(s) (LRB): LEFT TOTAL LEFT KNEE ARTHROPLASTY (Left)   Consults: None  HPI: Alexa Woods is a 57 y.o. year old female with end stage OA of her left knee with progressively worsening pain and dysfunction. She has constant pain, with activity and at rest and significant functional deficits with difficulties even with ADLs. She has had extensive non-op management including analgesics, injections  of cortisone and viscosupplements, and home exercise program, but remains in significant pain with significant dysfunction. Radiographs show bone on bone arthritis medially with recurrent effusions. She presents now for left Total Knee Arthroplasty.   Laboratory Data: Admission on 12/06/2017  Component Date Value Ref Range Status  . Glucose-Capillary 12/06/2017 202* 70 - 99 mg/dL Final  . Glucose-Capillary 12/06/2017 157* 70 - 99 mg/dL Final  . Comment 1 12/06/2017 Notify RN   Final  . Comment 2 12/06/2017 Document in Chart   Final  . WBC 12/07/2017 10.2  4.0 - 10.5 K/uL Final  . RBC 12/07/2017 4.52  3.87 - 5.11 MIL/uL Final  . Hemoglobin 12/07/2017 12.5  12.0 - 15.0 g/dL Final  . HCT 12/07/2017 38.0  36.0 - 46.0 % Final  . MCV 12/07/2017 84.1  78.0 - 100.0 fL Final  . MCH 12/07/2017 27.7  26.0 - 34.0 pg Final  . MCHC 12/07/2017 32.9  30.0 - 36.0 g/dL Final  . RDW 12/07/2017 13.5  11.5 - 15.5 % Final  . Platelets 12/07/2017 270  150 - 400 K/uL Final   Performed at [facility], [place] [address], [place],  [address]  . Sodium 12/07/2017 139  135 - 145 mmol/L Final  . Potassium 12/07/2017 3.9  3.5 - 5.1 mmol/L Final  . Chloride 12/07/2017 101  98 - 111 mmol/L Final   Please note change in reference range.  Marland Kitchen  CO2 12/07/2017 27  22 - 32 mmol/L Final  . Glucose, Bld 12/07/2017 329* 70 - 99 mg/dL Final   Please note change in reference range.  . BUN 12/07/2017 14  6 - 20 mg/dL Final   Please note change in reference range.  . Creatinine, Ser 12/07/2017 1.03* 0.44 - 1.00 mg/dL Final  . Calcium 12/07/2017 8.6* 8.9 - 10.3 mg/dL Final  . GFR calc non Af Amer 12/07/2017 >60  >60 mL/min Final  . GFR calc Af Amer 12/07/2017 >60  >60 mL/min Final   Comment: (NOTE) The eGFR has been calculated using the CKD EPI equation. This calculation has not been validated in all clinical situations. eGFR's persistently <60 mL/min signify possible Chronic Kidney Disease.   Georgiann Hahn  gap 12/07/2017 11  5 - 15 Final   Performed at [facility], [place] [address] [place], [place] [address]  . Glucose-Capillary 12/06/2017 229* 70 - 99 mg/dL Final  . Glucose-Capillary 12/06/2017 354* 70 - 99 mg/dL Final  . Glucose-Capillary 12/07/2017 293* 70 - 99 mg/dL Final  . Glucose-Capillary 12/07/2017 258* 70 - 99 mg/dL Final  . WBC 12/08/2017 12.3* 4.0 - 10.5 K/uL Final  . RBC 12/08/2017 4.50  3.87 - 5.11 MIL/uL Final  . Hemoglobin 12/08/2017 12.3  12.0 - 15.0 g/dL Final  . HCT 12/08/2017 38.2  36.0 - 46.0 % Final  . MCV 12/08/2017 84.9  78.0 - 100.0 fL Final  . MCH 12/08/2017 27.3  26.0 - 34.0 pg Final  . MCHC 12/08/2017 32.2  30.0 - 36.0 g/dL Final  . RDW 12/08/2017 13.9  11.5 - 15.5 % Final  . Platelets 12/08/2017 346  150 - 400 K/uL Final   Performed at [facility], [place] [address], [place], [place] [address]  . Sodium 12/08/2017 140  135 - 145 mmol/L Final  . Potassium 12/08/2017 3.8  3.5 - 5.1 mmol/L Final  . Chloride 12/08/2017 100  98 - 111 mmol/L Final   Please note change in reference range.  . CO2 12/08/2017 29  22 - 32 mmol/L Final  . Glucose, Bld 12/08/2017 282* 70 - 99 mg/dL Final   Please note change in reference range.  . BUN 12/08/2017 16  6 - 20 mg/dL Final   Please note change in reference range.  . Creatinine, Ser 12/08/2017 0.98  0.44 - 1.00 mg/dL Final  . Calcium 12/08/2017 9.2  8.9 - 10.3 mg/dL Final  . GFR calc non Af Amer 12/08/2017 >60  >60 mL/min Final  . GFR calc Af Amer 12/08/2017 >60  >60 mL/min Final   Comment: (NOTE) The eGFR has been calculated using the CKD EPI equation. This calculation has not been validated in all clinical situations. eGFR's persistently <60 mL/min signify possible Chronic Kidney Disease.   Georgiann Hahn gap 12/08/2017 11  5 - 15 Final   Performed at [facility], [place] [address], [place], [place] [address]  . Glucose-Capillary 12/07/2017 308* 70 - 99 mg/dL Final  .  Comment 1 12/07/2017 Notify RN   Final  . Comment 2 12/07/2017 Document in Chart   Final  . Glucose-Capillary 12/07/2017 288* 70 - 99 mg/dL Final  . Glucose-Capillary 12/08/2017 269* 70 - 99 mg/dL Final  . Glucose-Capillary 12/07/2017 304* 70 - 99 mg/dL Final  . Glucose-Capillary 12/08/2017 239* 70 - 99 mg/dL Final  . WBC 12/09/2017 9.5  4.0 - 10.5 K/uL Final  . RBC 12/09/2017 4.21  3.87 - 5.11 MIL/uL Final  .  Hemoglobin 12/09/2017 11.3* 12.0 - 15.0 g/dL Final  . HCT 12/09/2017 36.1  36.0 - 46.0 % Final  . MCV 12/09/2017 85.7  78.0 - 100.0 fL Final  . MCH 12/09/2017 26.8  26.0 - 34.0 pg Final  . MCHC 12/09/2017 31.3  30.0 - 36.0 g/dL Final  . RDW 12/09/2017 13.8  11.5 - 15.5 % Final  . Platelets 12/09/2017 314  150 - 400 K/uL Final   Performed at [facility], [place] [address], [place], [place] [address]  . Glucose-Capillary 12/08/2017 305* 70 - 99 mg/dL Final  . Glucose-Capillary 12/08/2017 281* 70 - 99 mg/dL Final  . Comment 1 12/08/2017 Notify RN   Final  . Comment 2 12/08/2017 Document in Chart   Final  . Glucose-Capillary 12/09/2017 208* 70 - 99 mg/dL Final  Hospital Outpatient Visit on 11/30/2017  Component Date Value Ref Range Status  . aPTT 11/30/2017 26  24 - 36 seconds Final   Performed at [facility], [place] [address] [place], [place] [address]  . WBC 11/30/2017 8.7  4.0 - 10.5 K/uL Final  . RBC 11/30/2017 5.02  3.87 - 5.11 MIL/uL Final  . Hemoglobin 11/30/2017 14.0  12.0 - 15.0 g/dL Final  . HCT 11/30/2017 41.6  36.0 - 46.0 % Final  . MCV 11/30/2017 82.9  78.0 - 100.0 fL Final  . MCH 11/30/2017 27.9  26.0 - 34.0 pg Final  . MCHC 11/30/2017 33.7  30.0 - 36.0 g/dL Final  . RDW 11/30/2017 13.3  11.5 - 15.5 % Final  . Platelets 11/30/2017 335  150 - 400 K/uL Final   Performed at [facility], [place] [address] [place], [place] [address]  . Sodium 11/30/2017 141  135 - 145 mmol/L Final  . Potassium 11/30/2017 3.4* 3.5 -  5.1 mmol/L Final  . Chloride 11/30/2017 103  98 - 111 mmol/L Final   Please note change in reference range.  . CO2 11/30/2017 30  22 - 32 mmol/L Final  . Glucose, Bld 11/30/2017 182* 70 - 99 mg/dL Final   Please note change in reference range.  . BUN 11/30/2017 12  6 - 20 mg/dL Final   Please note change in reference range.  . Creatinine, Ser 11/30/2017 0.97  0.44 - 1.00 mg/dL Final  . Calcium 11/30/2017 9.7  8.9 - 10.3 mg/dL Final  . Total Protein 11/30/2017 8.1  6.5 - 8.1 g/dL Final  . Albumin 11/30/2017 4.0  3.5 - 5.0 g/dL Final  . AST 11/30/2017 20  15 - 41 U/L Final  . ALT 11/30/2017 23  0 - 44 U/L Final   Please note change in reference range.  . Alkaline Phosphatase 11/30/2017 152* 38 - 126 U/L Final  . Total Bilirubin 11/30/2017 0.3  0.3 - 1.2 mg/dL Final  . GFR calc non Af Amer 11/30/2017 >60  >60 mL/min Final  . GFR calc Af Amer 11/30/2017 >60  >60 mL/min Final   Comment: (NOTE) The eGFR has been calculated using the CKD EPI equation. This calculation has not been validated in all clinical situations. eGFR's persistently <60 mL/min signify possible Chronic Kidney Disease.   Georgiann Hahn gap 11/30/2017 8  5 - 15 Final   Performed at [facility], [place] [address] [place] [address], [place],  [address]  . Prothrombin Time 11/30/2017 12.4  11.4 - 15.2 seconds Final  . INR 11/30/2017 0.94   Final   Performed at [facility], [place] [address] [place],  [place] [address]  . ABO/RH(D) 11/30/2017 A POS   Final  . Antibody Screen 11/30/2017 NEG   Final  . Sample Expiration 11/30/2017 12/09/2017   Final  . Extend sample reason 11/30/2017    Final                   Value:NO TRANSFUSIONS OR PREGNANCY IN THE PAST 3 MONTHS Performed at [facility], [place] [address], [place], [place] [address]   . Color, Urine 11/30/2017 YELLOW  YELLOW Final  . APPearance 11/30/2017 CLEAR  CLEAR Final  . Specific Gravity, Urine 11/30/2017 1.015  1.005 - 1.030  Final  . pH 11/30/2017 6.0  5.0 - 8.0 Final  . Glucose, UA 11/30/2017 NEGATIVE  NEGATIVE mg/dL Final  . Hgb urine dipstick 11/30/2017 SMALL* NEGATIVE Final  . Bilirubin Urine 11/30/2017 NEGATIVE  NEGATIVE Final  . Ketones, ur 11/30/2017 NEGATIVE  NEGATIVE mg/dL Final  . Protein, ur 11/30/2017 NEGATIVE  NEGATIVE mg/dL Final  . Nitrite 11/30/2017 NEGATIVE  NEGATIVE Final  . Leukocytes, UA 11/30/2017 NEGATIVE  NEGATIVE Final  . RBC / HPF 11/30/2017 0-5  0 - 5 RBC/hpf Final  . WBC, UA 11/30/2017 0-5  0 - 5 WBC/hpf Final  . Bacteria, UA 11/30/2017 RARE* NONE SEEN Final  . Squamous Epithelial / LPF 11/30/2017 0-5  0 - 5 Final  . Mucus 11/30/2017 PRESENT   Final   Performed at [facility], [place] [address] [place], [place] [address]  . MRSA, PCR 11/30/2017 NEGATIVE  NEGATIVE Final  . Staphylococcus aureus 11/30/2017 POSITIVE* NEGATIVE Final   Comment: (NOTE) The Xpert SA Assay (FDA approved for NASAL specimens in patients 38 years of age and older), is one component of a comprehensive surveillance program. It is not intended to diagnose infection nor to guide or monitor treatment. Performed at [facility], [place] [address] [place], [place] [address]   . Hgb A1c MFr Bld 11/30/2017 8.9* 4.8 - 5.6 % Final   Comment: (NOTE) Pre diabetes:          5.7%-6.4% Diabetes:              >6.4% Glycemic control for   <7.0% adults with diabetes   . Mean Plasma Glucose 11/30/2017 208.73  mg/dL Final   Performed at [place] [address] [place], [place] [address]  . Glucose-Capillary 11/30/2017 138* 70 - 99 mg/dL Final     X-Rays:No results found.  EKG: Orders placed or performed in visit on 04/05/17  . EKG 12-Lead     Hospital Course: Alexa Woods is a 57 y.o. who was admitted to [facility]. They were brought to the operating room on 12/06/2017 and underwent Procedure(s): LEFT TOTAL LEFT KNEE ARTHROPLASTY.  Patient tolerated the  procedure well and was later transferred to the recovery room and then to the orthopaedic floor for postoperative care.  They were given PO and IV analgesics for pain control following their surgery.  They were given 24 hours of postoperative antibiotics of  Anti-infectives (From admission, onward)   Start     Dose/Rate Route Frequency Ordered Stop   12/06/17 2000  ceFAZolin (ANCEF) IVPB 2g/100 mL premix     2 g 200 mL/hr over 30 Minutes Intravenous Every 6 hours 12/06/17 1741 12/07/17 0149   12/06/17 1030  ceFAZolin (ANCEF) IVPB 2g/100 mL premix     2 g 200 mL/hr over 30 Minutes Intravenous On call to O.R. 12/06/17 1016 12/06/17  1355     and started on DVT prophylaxis in the form of Aspirin.   PT and OT were ordered for total joint protocol.  Discharge planning consulted to help with postop disposition and equipment needs.  Patient had a decent night on the evening of surgery. She started to get up OOB with therapy on POD #1. Hemovac drain was pulled without difficulty. Continued to work with therapy into POD #2. Dressing was changed and incision was clean, dry, and intact with no drainage. Patient was seen in rounds on POD #3 and was ready to be discharged. Incision was healing well. Patient had progressed with therapy and was meeting their goals. Currently awaiting SNF placement, pt originally preferred [facility], however this was out of network.   Diet: Diabetic diet Activity:WBAT Follow-up:in 2 weeks in the office with Dr. Wynelle Link Disposition - Skilled nursing facility Discharged Condition: stable   Discharge Instructions    Call MD / Call 911   Complete by:  As directed    If you experience chest pain or shortness of breath, CALL 911 and be transported to the hospital emergency room.  If you develope a fever above 101 F, pus (white drainage) or increased drainage or redness at the wound, or calf pain, call your surgeon's office.   Change dressing   Complete by:  As directed     Change the dressing daily with sterile 4 x 4 inch gauze dressing and apply TED hose.  You may clean the incision with alcohol prior to redressing.   Constipation Prevention   Complete by:  As directed    Drink plenty of fluids.  Prune juice may be helpful.  You may use a stool softener, such as Colace (over the counter) 100 mg twice a day.  Use MiraLax (over the counter) for constipation as needed.   Diet Carb Modified   Complete by:  As directed    Discharge instructions   Complete by:  As directed    Dr. Gaynelle Arabian Total Joint Specialist [facility] [address], [place], [place] [address] [phone]  TOTAL KNEE REPLACEMENT POSTOPERATIVE DIRECTIONS  Knee Rehabilitation, Guidelines Following Surgery  Results after knee surgery are often greatly improved when you follow the exercise, range of motion and muscle strengthening exercises prescribed by your doctor. Safety measures are also important to protect the knee from further injury. Any time any of these exercises cause you to have increased pain or swelling in your knee joint, decrease the amount until you are comfortable again and slowly increase them. If you have problems or questions, call your caregiver or physical therapist for advice.   HOME CARE INSTRUCTIONS  Remove items at home which could result in a fall. This includes throw rugs or furniture in walking pathways.  ICE to the affected knee every three hours for 30 minutes at a time and then as needed for pain and swelling.  Continue to use ice on the knee for pain and swelling from surgery. You may notice swelling that will progress down to the foot and ankle.  This is normal after surgery.  Elevate the leg when you are not up walking on it.   Continue to use the breathing machine which will help keep your temperature down.  It is common for your temperature to cycle up and down following surgery, especially at night when you are not up moving around and  exerting yourself.  The breathing machine keeps your lungs expanded and your temperature  down. Do not place pillow under knee, focus on keeping the knee straight while resting   DIET You may resume your previous home diet once your are discharged from the hospital.  DRESSING / WOUND CARE / SHOWERING You may shower 3 days after surgery, but keep the wounds dry during showering.  You may use an occlusive plastic wrap (Press'n Seal for example), NO SOAKING/SUBMERGING IN THE BATHTUB.  If the bandage gets wet, change with a clean dry gauze.  If the incision gets wet, pat the wound dry with a clean towel. You may start showering once you are discharged home but do not submerge the incision under water. Just pat the incision dry and apply a dry gauze dressing on daily. Change the surgical dressing daily and reapply a dry dressing each time.  ACTIVITY Walk with your walker as instructed. Use walker as long as suggested by your caregivers. Avoid periods of inactivity such as sitting longer than an hour when not asleep. This helps prevent blood clots.  You may resume a sexual relationship in one month or when given the OK by your doctor.  You may return to work once you are cleared by your doctor.  Do not drive a car for 6 weeks or until released by you surgeon.  Do not drive while taking narcotics.  WEIGHT BEARING Weight bearing as tolerated with assist device (walker, cane, etc) as directed, use it as long as suggested by your surgeon or therapist, typically at least 4-6 weeks.  POSTOPERATIVE CONSTIPATION PROTOCOL Constipation - defined medically as fewer than three stools per week and severe constipation as less than one stool per week.  One of the most common issues patients have following surgery is constipation.  Even if you have a regular bowel pattern at home, your normal regimen is likely to be disrupted due to multiple reasons following surgery.  Combination of anesthesia, postoperative  narcotics, change in appetite and fluid intake all can affect your bowels.  In order to avoid complications following surgery, here are some recommendations in order to help you during your recovery period.  Colace (docusate) - Pick up an over-the-counter form of Colace or another stool softener and take twice a day as long as you are requiring postoperative pain medications.  Take with a full glass of water daily.  If you experience loose stools or diarrhea, hold the colace until you stool forms back up.  If your symptoms do not get better within 1 week or if they get worse, check with your doctor.  Dulcolax (bisacodyl) - Pick up over-the-counter and take as directed by the product packaging as needed to assist with the movement of your bowels.  Take with a full glass of water.  Use this product as needed if not relieved by Colace only.   MiraLax (polyethylene glycol) - Pick up over-the-counter to have on hand.  MiraLax is a solution that will increase the amount of water in your bowels to assist with bowel movements.  Take as directed and can mix with a glass of water, juice, soda, coffee, or tea.  Take if you go more than two days without a movement. Do not use MiraLax more than once per day. Call your doctor if you are still constipated or irregular after using this medication for 7 days in a row.  If you continue to have problems with postoperative constipation, please contact the office for further assistance and recommendations.  If you experience "the worst abdominal pain ever" or  develop nausea or vomiting, please contact the office immediatly for further recommendations for treatment.  ITCHING  If you experience itching with your medications, try taking only a single pain pill, or even half a pain pill at a time.  You can also use Benadryl over the counter for itching or also to help with sleep.   TED HOSE STOCKINGS Wear the elastic stockings on both legs for three weeks following surgery  during the day but you may remove then at night for sleeping.  MEDICATIONS See your medication summary on the "After Visit Summary" that the nursing staff will review with you prior to discharge.  You may have some home medications which will be placed on hold until you complete the course of blood thinner medication.  It is important for you to complete the blood thinner medication as prescribed by your surgeon.  Continue your approved medications as instructed at time of discharge.  PRECAUTIONS If you experience chest pain or shortness of breath - call 911 immediately for transfer to the hospital emergency department.  If you develop a fever greater that 101 F, purulent drainage from wound, increased redness or drainage from wound, foul odor from the wound/dressing, or calf pain - CONTACT YOUR SURGEON.                                                   FOLLOW-UP APPOINTMENTS Make sure you keep all of your appointments after your operation with your surgeon and caregivers. You should call the office at the above phone number and make an appointment for approximately two weeks after the date of your surgery or on the date instructed by your surgeon outlined in the "After Visit Summary".   RANGE OF MOTION AND STRENGTHENING EXERCISES  Rehabilitation of the knee is important following a knee injury or an operation. After just a few days of immobilization, the muscles of the thigh which control the knee become weakened and shrink (atrophy). Knee exercises are designed to build up the tone and strength of the thigh muscles and to improve knee motion. Often times heat used for twenty to thirty minutes before working out will loosen up your tissues and help with improving the range of motion but do not use heat for the first two weeks following surgery. These exercises can be done on a training (exercise) mat, on the floor, on a table or on a bed. Use what ever works the best and is most comfortable for you  Knee exercises include:  Leg Lifts - While your knee is still immobilized in a splint or cast, you can do straight leg raises. Lift the leg to 60 degrees, hold for 3 sec, and slowly lower the leg. Repeat 10-20 times 2-3 times daily. Perform this exercise against resistance later as your knee gets better.  Quad and Hamstring Sets - Tighten up the muscle on the front of the thigh (Quad) and hold for 5-10 sec. Repeat this 10-20 times hourly. Hamstring sets are done by pushing the foot backward against an object and holding for 5-10 sec. Repeat as with quad sets.  Leg Slides: Lying on your back, slowly slide your foot toward your buttocks, bending your knee up off the floor (only go as far as is comfortable). Then slowly slide your foot back down until your leg is flat on the floor  again. Glenard Haring Wings: Lying on your back spread your legs to the side as far apart as you can without causing discomfort.  A rehabilitation program following serious knee injuries can speed recovery and prevent re-injury in the future due to weakened muscles. Contact your doctor or a physical therapist for more information on knee rehabilitation.   IF YOU ARE TRANSFERRED TO A SKILLED REHAB FACILITY If the patient is transferred to a skilled rehab facility following release from the hospital, a list of the current medications will be sent to the facility for the patient to continue.  When discharged from the skilled rehab facility, please have the facility set up the patient's [place] prior to being released. Also, the skilled facility will be responsible for providing the patient with their medications at time of release from the facility to include their pain medication, the muscle relaxants, and their blood thinner medication. If the patient is still at the rehab facility at time of the two week follow up appointment, the skilled rehab facility will also need to assist the patient in arranging follow up  appointment in our office and any transportation needs.  MAKE SURE YOU:  Understand these instructions.  Get help right away if you are not doing well or get worse.    Pick up stool softner and laxative for home use following surgery while on pain medications. Do not submerge incision under water. Please use good hand washing techniques while changing dressing each day. May shower starting three days after surgery. Please use a clean towel to pat the incision dry following showers. Continue to use ice for pain and swelling after surgery. Do not use any lotions or creams on the incision until instructed by your surgeon.   Do not put a pillow under the knee. Place it under the heel.   Complete by:  As directed    Driving restrictions   Complete by:  As directed    No driving for two weeks   TED hose   Complete by:  As directed    Use stockings (TED hose) for three weeks on both leg(s).  You may remove them at night for sleeping.   Weight bearing as tolerated   Complete by:  As directed      Allergies as of 12/09/2017      Reactions   Bupropion Hcl Other (See Comments)   Pt is unsure   Cefuroxime Axetil Nausea Only   Darvon [propoxyphene]    wheezing   Gabapentin    Pt does not remember reaction    Lidocaine    REACTION: unknown   Metformin    REACTION: GI   Paroxetine    REACTION: doesn't agree   Tramadol Hcl    REACTION: Causes Anxiety   Codeine Nausea And Vomiting, Rash   Sulfa Antibiotics Rash      Medication List    TAKE these medications   albuterol 108 (90 Base) MCG/ACT inhaler Commonly known as:  PROVENTIL HFA;VENTOLIN HFA Inhale 2 puffs into the lungs every 6 (six) hours as needed for wheezing or shortness of breath.   AMBIEN 10 MG tablet Generic drug:  zolpidem Take 15 mg by mouth at bedtime.   aspirin 325 MG EC tablet Take 1 tablet (325 mg total) by mouth 2 (two) times daily for 18 days. Take one tablet (325 mg) Aspirin two times a day for three weeks  following surgery. Then take one baby Aspirin (81 mg) once a day for  three weeks. Then discontinue aspirin.   beclomethasone 80 MCG/ACT inhaler Commonly known as:  QVAR Inhale 2 puffs 2 (two) times daily into the lungs. Rinse mouth after use. What changed:  additional instructions   bisacodyl 10 MG suppository Commonly known as:  DULCOLAX Place 1 suppository (10 mg total) rectally daily as needed for moderate constipation.   docusate sodium 100 MG capsule Commonly known as:  COLACE Take 100 mg by mouth every morning.   fluticasone 50 MCG/ACT nasal spray Commonly known as:  FLONASE Place 2 sprays into both nostrils daily. What changed:    when to take this  reasons to take this   GLOBAL INJECT EASE INSULIN SYR 31G X 5/16" 0.5 ML Misc Generic drug:  Insulin Syringe-Needle U-100   Insulin Syringe-Needle U-100 31G X 5/16" 0.5 ML Misc Commonly known as:  GLOBAL INJECT EASE INSULIN SYR USE THREE TIMES PER DAY   glucose blood test strip Commonly known as:  ONE TOUCH ULTRA TEST Use to test blood sugar 4 times daily as instructed.   hydrochlorothiazide 25 MG tablet Commonly known as:  HYDRODIURIL TAKE 1 TABLET BY MOUTH DAILY What changed:    how much to take  how to take this  when to take this   hydrocortisone 25 MG suppository Commonly known as:  ANUSOL-HC Place 1 suppository (25 mg total) rectally 2 (two) times daily. What changed:    when to take this  reasons to take this   insulin detemir 100 UNIT/ML injection Commonly known as:  LEVEMIR Inject 0.5 mLs (50 Units total) into the skin daily. What changed:  when to take this   Insulin Pen Needle 32G X 4 MM Misc Commonly known as:  BD PEN NEEDLE NANO U/F 1 each by Does not apply route as directed.   insulin regular 100 units/mL injection Commonly known as:  HUMULIN R Inject 0.14-0.24 mLs (14-24 Units total) into the skin 3 (three) times daily before meals. What changed:    how much to take  additional  instructions   LORazepam 1 MG tablet Commonly known as:  ATIVAN Take 1 mg by mouth 4 (four) times daily as needed for anxiety or sleep.   methocarbamol 500 MG tablet Commonly known as:  ROBAXIN Take 1 tablet (500 mg total) by mouth every 6 (six) hours as needed for muscle spasms.   MIRALAX PO Take by mouth as needed.   ondansetron 4 MG tablet Commonly known as:  ZOFRAN Take 1 tablet (4 mg total) by mouth every 6 (six) hours as needed for nausea.   onetouch ultrasoft lancets Use to test blood sugar 4 times daily as instructed.   oxyCODONE-acetaminophen 10-325 MG tablet Commonly known as:  PERCOCET Take 1 tablet by mouth 3 (three) times daily.   pantoprazole 40 MG tablet Commonly known as:  PROTONIX Take 1 tablet (40 mg total) by mouth 2 (two) times daily. What changed:  when to take this   perphenazine 2 MG tablet Commonly known as:  TRILAFON Take 2 mg by mouth at bedtime.   potassium chloride 10 MEQ tablet Commonly known as:  K-DUR,KLOR-CON Take 1 tablet (10 mEq total) by mouth daily. What changed:  when to take this   sertraline 100 MG tablet Commonly known as:  ZOLOFT Take 200 mg by mouth every morning.   tiZANidine 4 MG tablet Commonly known as:  ZANAFLEX Take 4 mg by mouth 2 (two) times daily.            Discharge  Care Instructions  (From admission, onward)        Start     Ordered   12/09/17 0000  Weight bearing as tolerated     12/09/17 0802   12/09/17 0000  Change dressing    Comments:  Change the dressing daily with sterile 4 x 4 inch gauze dressing and apply TED hose.  You may clean the incision with alcohol prior to redressing.   12/09/17 0802     Follow-up Information    Gaynelle Arabian, MD. Schedule an appointment as soon as possible for a visit on 12/21/2017.   Specialty:  Orthopedic Surgery Contact information: [address] [place] [address] [phone]           Signed: Theresa Duty, PA-C Orthopaedic  Surgery 12/09/2017, 8:03 AM

## 2017-12-09 NOTE — Progress Notes (Signed)
No results from suppository except gas. Pt given Fleets enema.

## 2017-12-09 NOTE — Clinical Social Work Placement (Addendum)
D/C summary sent to facility. Waiting on Ship broker.  Nurse given number to call report.  [facility] arranged for transport.   CLINICAL SOCIAL WORK PLACEMENT  NOTE  Date:  12/09/2017  Patient Details  Name: Alexa Woods MRN: [id number] Date of Birth: 04/23/1961  Clinical Social Work is seeking post-discharge placement for this patient at the [place] level of care (*CSW will initial, date and re-position this form in  chart as items are completed):  Yes   Patient/family provided with [facility] Work Department's list of facilities offering this level of care within the geographic area requested by the patient (or if unable, by the patient's family).  Yes   Patient/family informed of their freedom to choose among providers that offer the needed level of care, that participate in Medicare, Medicaid or managed care program needed by the patient, have an available bed and are willing to accept the patient.  Yes   Patient/family informed of [facility]'s ownership interest in [facility] and [facility], as well as of the fact that they are under no obligation to receive care at these facilities.  PASRR submitted to EDS on 12/07/17     PASRR number received on 12/07/17     Existing PASRR number confirmed on       FL2 transmitted to all facilities in geographic area requested by pt/family on       FL2 transmitted to all facilities within larger geographic area on 12/09/17     Patient informed that his/her managed care company has contracts with or will negotiate with certain facilities, including the following:        No   Patient/family informed of bed offers received.  Patient chooses bed at [facility]     Physician recommends and patient chooses bed at      Patient to be transferred to [facility] on 12/09/17.  Patient to be transferred to facility by [facility]AR      Patient family notified on 12/09/17 of transfer.  Name of  family member notified:  Daughter/ Patient to notify.      PHYSICIAN Please prepare priority discharge summary, including medications     Additional Comment:    _______________________________________________ Lia Hopping, LCSW 12/09/2017, 11:19 AM

## 2017-12-10 ENCOUNTER — Encounter: Payer: Self-pay | Admitting: Adult Health

## 2017-12-10 ENCOUNTER — Other Ambulatory Visit: Payer: Self-pay

## 2017-12-10 ENCOUNTER — Non-Acute Institutional Stay (SKILLED_NURSING_FACILITY): Payer: Managed Care, Other (non HMO) | Admitting: Adult Health

## 2017-12-10 DIAGNOSIS — E1142 Type 2 diabetes mellitus with diabetic polyneuropathy: Secondary | ICD-10-CM | POA: Diagnosis not present

## 2017-12-10 DIAGNOSIS — G9340 Encephalopathy, unspecified: Secondary | ICD-10-CM

## 2017-12-10 DIAGNOSIS — M1712 Unilateral primary osteoarthritis, left knee: Secondary | ICD-10-CM | POA: Diagnosis not present

## 2017-12-10 DIAGNOSIS — G4700 Insomnia, unspecified: Secondary | ICD-10-CM

## 2017-12-10 DIAGNOSIS — F418 Other specified anxiety disorders: Secondary | ICD-10-CM

## 2017-12-10 DIAGNOSIS — I952 Hypotension due to drugs: Secondary | ICD-10-CM | POA: Diagnosis not present

## 2017-12-10 MED ORDER — OXYCODONE-ACETAMINOPHEN 10-325 MG PO TABS: 1.0000 | ORAL_TABLET | Freq: Three times a day (TID) | ORAL | 0 refills | Status: DC

## 2017-12-10 NOTE — Telephone Encounter (Signed)
Continuance of Schedule II Medication Therapy form faxed to [facility].

## 2017-12-10 NOTE — Progress Notes (Signed)
Location:  [facility] Number: [id number] Place of Service:  SNF (31) Provider:  Durenda Age, NP  Patient Care Team: Tower, Wynelle Fanny, MD as PCP - General  Extended Emergency Contact Information Primary Emergency Contact: Stellmach,Moses Address: [address]          [place], [place] [address] Johnnette Litter of [place] Phone: [phone] Mobile Phone: [phone] Relation: Spouse  Code Status:  Full Code  Goals of care: Advanced Directive information Advanced Directives 12/06/2017  Does Patient Have a Medical Advance Directive? No  Would patient like information on creating a medical advance directive? Yes (Inpatient - patient requests chaplain consult to create a medical advance directive)     Chief Complaint  Patient presents with  . Acute Visit    Patient is seen for hospital followup, status post     HPI:  Pt is a 57 y.o. female seen today for hospital followup.  She was [facility] for short-term rehabilitation on 12/09/17 status post hospitalization at [facility] 7/8-7/11/19 for arthroplasty of her left knee due to osteoarthritis.  She has a PMH of alcohol abuse in remission, anxiety/depression. GERD, herpes zoster, Bell's palsy, IBS, morbid obesity, and hypertension.    She was seen in the room today and was noted to be very sleepy. Latest BP 74/46. She answers verbal inquiries. She is alert and oriented X 3. She rated her left knee surgical site as 7/10. She currently takes Percocet 10-325 mg 1 tab TID.   Past Medical History:  Diagnosis Date  . Alcohol abuse, in remission    since 1998  . Allergic rhinitis   . Anxiety   . Asthma   . Bilateral lower extremity edema   . Bulging lumbar disc   . Bulging of cervical intervertebral disc   . Chronic constipation   . DDD (degenerative disc disease), lumbosacral   . Depression   . Dyspnea   . GERD (gastroesophageal reflux disease)   . Hemorrhoids   . History  of Bell's palsy 08/2007   left  . History of chronic cystitis   . IBS (irritable bowel syndrome)   . Insulin dependent type 2 diabetes mellitus [facility])    endocrinologist-- dr Cruzita Lederer  . Lower urinary tract symptoms (LUTS)   . OA (osteoarthritis)    knees, back, hands, elbows  . OSA (obstructive sleep apnea)    per study 06-08-2017 mild osa , cpap recommended , per pt insurance issue  . Peripheral neuropathy   . PONV (postoperative nausea and vomiting)   . Psoriasis   . S/P dilatation of esophageal stricture   . Unspecified essential hypertension   . Wears partial dentures    lower   Past Surgical History:  Procedure Laterality Date  . CHOLECYSTECTOMY OPEN  1990   AND APPENDECTOMY  . DOBUTAMINE STRESS ECHO  2009    normal stress echo, no evidence of ischemia  . ELBOW SURGERY Bilateral RIGHT 2013;  LEFT 2016   for nerve damage  . ESOPHAGOGASTRODUODENOSCOPY  07/2002   erythematous gastropathy  . KNEE ARTHROSCOPY Left 11/ 2018   dr Wynelle Link  @ [facility]  . KNEE ARTHROSCOPY W/ PARTIAL MEDIAL MENISCECTOMY Right 10/2008   and chondroplasty  . SHOULDER ARTHROSCOPY WITH DISTAL CLAVICLE RESECTION Left 12/ 2011   dr dean  . TOTAL KNEE ARTHROPLASTY Right 07-01-2009  dr Wynelle Link   [facility]  . TOTAL KNEE ARTHROPLASTY Left 12/06/2017   Procedure: LEFT TOTAL LEFT KNEE ARTHROPLASTY;  Surgeon: Gaynelle Arabian, MD;  Location: [facility] ORS;  Service: Orthopedics;  Laterality: Left;  Marland Kitchen VAGINAL HYSTERECTOMY  2003    Allergies  Allergen Reactions  . Bupropion Hcl Other (See Comments)    Pt is unsure  . Cefuroxime Axetil Nausea Only  . Darvon [Propoxyphene]     wheezing  . Gabapentin     Pt does not remember reaction   . Lidocaine     REACTION: unknown  . Metformin     REACTION: GI  . Paroxetine     REACTION: doesn't agree  . Tramadol Hcl     REACTION: Causes Anxiety  . Codeine Nausea And Vomiting and Rash  . Sulfa Antibiotics Rash    Outpatient Encounter Medications as of 12/10/2017  Medication Sig  .  acetaminophen (TYLENOL) 325 MG tablet Take 650 mg by mouth every 4 (four) hours as needed for mild pain or fever.  Marland Kitchen albuterol (PROVENTIL HFA;VENTOLIN HFA) 108 (90 Base) MCG/ACT inhaler Inhale 2 puffs into the lungs every 6 (six) hours as needed for wheezing or shortness of breath.  Marland Kitchen aspirin EC 325 MG EC tablet Take 1 tablet (325 mg total) by mouth 2 (two) times daily for 18 days. Take one tablet (325 mg) Aspirin two times a day for three weeks following surgery. Then take one baby Aspirin (81 mg) once a day for three weeks. Then discontinue aspirin.  Derrill Memo ON 12/31/2017] aspirin EC 81 MG tablet Take 81 mg by mouth daily. Take for 3 weeks following completion of 325 mg dosage.  . beclomethasone (QVAR) 80 MCG/ACT inhaler Inhale 2 puffs 2 (two) times daily into the lungs. Rinse mouth after use.  . bisacodyl (DULCOLAX) 10 MG suppository Place 1 suppository (10 mg total) rectally daily as needed for moderate constipation.  . docusate sodium (COLACE) 100 MG capsule Take 100 mg by mouth every morning.   . fluticasone (FLONASE) 50 MCG/ACT nasal spray Place 2 sprays into both nostrils daily as needed for allergies or rhinitis.  . hydrochlorothiazide (HYDRODIURIL) 25 MG tablet TAKE 1 TABLET BY MOUTH DAILY  . hydrocortisone (ANUSOL-HC) 25 MG suppository Place 25 mg rectally 2 (two) times daily as needed for hemorrhoids or anal itching.  . insulin detemir (LEVEMIR) 100 UNIT/ML injection Inject 0.5 mLs (50 Units total) into the skin daily.  . insulin regular (NOVOLIN R,HUMULIN R) 100 units/mL injection Inject 15-20 Units into the skin See admin instructions. Give 15 units BID at 12 AM and 5 PM, give 20 units at 8 AM  . LORazepam (ATIVAN) 1 MG tablet Take 1 mg by mouth every 8 (eight) hours as needed for anxiety or sleep.   . methocarbamol (ROBAXIN) 500 MG tablet Take 1 tablet (500 mg total) by mouth every 6 (six) hours as needed for muscle spasms.  . ondansetron (ZOFRAN) 4 MG tablet Take 1 tablet (4 mg total)  by mouth every 6 (six) hours as needed for nausea.  Marland Kitchen oxyCODONE-acetaminophen (PERCOCET) 10-325 MG tablet Take 1 tablet by mouth 3 (three) times daily as needed for pain.  . pantoprazole (PROTONIX) 40 MG tablet Take 1 tablet (40 mg total) by mouth 2 (two) times daily.  Marland Kitchen perphenazine (TRILAFON) 2 MG tablet Take 2 mg by mouth at bedtime.  . Polyethylene Glycol 3350 (MIRALAX PO) Take by mouth as needed.  . potassium chloride (K-DUR,KLOR-CON) 10 MEQ tablet Take 1 tablet (10 mEq total) by mouth daily.  . sertraline (ZOLOFT) 100 MG tablet Take 200 mg by mouth every morning.   Marland Kitchen  tiZANidine (ZANAFLEX) 4 MG tablet Take 4 mg by mouth 2 (two) times daily as needed.   . zolpidem (AMBIEN) 10 MG tablet Take 10 mg by mouth at bedtime as needed for sleep.   . [DISCONTINUED] fluticasone (FLONASE) 50 MCG/ACT nasal spray Place 2 sprays into both nostrils daily.  . [DISCONTINUED] oxyCODONE-acetaminophen (PERCOCET) 10-325 MG tablet Take 1 tablet by mouth 3 (three) times daily.  Marland Kitchen GLOBAL INJECT EASE INSULIN SYR 31G X 5/16" 0.5 ML MISC   . glucose blood (ONE TOUCH ULTRA TEST) test strip Use to test blood sugar 4 times daily as instructed. (Patient not taking: Reported on 12/10/2017)  . Insulin Pen Needle (BD PEN NEEDLE NANO U/F) 32G X 4 MM MISC 1 each by Does not apply route as directed. (Patient not taking: Reported on 12/10/2017)  . Insulin Syringe-Needle U-100 (GLOBAL INJECT EASE INSULIN SYR) 31G X 5/16" 0.5 ML MISC USE THREE TIMES PER DAY (Patient not taking: Reported on 12/10/2017)  . Lancets (ONETOUCH ULTRASOFT) lancets Use to test blood sugar 4 times daily as instructed. (Patient not taking: Reported on 12/10/2017)  . [DISCONTINUED] hydrocortisone (ANUSOL-HC) 25 MG suppository Place 1 suppository (25 mg total) rectally 2 (two) times daily.  . [DISCONTINUED] insulin regular (HUMULIN R) 100 units/mL injection Inject 0.14-0.24 mLs (14-24 Units total) into the skin 3 (three) times daily before meals.  . [DISCONTINUED]  potassium chloride (KLOR-CON) 10 MEQ CR tablet Take 1 tablet (10 mEq total) by mouth daily.   No facility-administered encounter medications on file as of 12/10/2017.     Review of Systems  GENERAL: No change in appetite, no fatigue, no weight changes, no fever, chills or weakness MOUTH and THROAT: Denies oral discomfort, gingival pain or bleeding, pain from teeth or hoarseness   RESPIRATORY: no cough, SOB, DOE, wheezing, hemoptysis CARDIAC: No chest pain, edema or palpitations GI: No abdominal pain, diarrhea, constipation, heart burn, nausea or vomiting GU: Denies dysuria, frequency, hematuria, incontinence, or discharge NEUROLOGICAL: Sleepy PSYCHIATRIC: Denies feelings of depression or anxiety. No report of hallucinations, insomnia, paranoia, or agitation    Immunization History  Administered Date(s) Administered  . Influenza Split 02/20/2011, 04/19/2012  . Influenza Whole 04/02/2006, 06/19/2009  . Influenza,inj,Quad PF,6+ Mos 04/02/2014, 08/02/2015, 04/07/2016, 04/05/2017  . Influenza,inj,quad, With Preservative 04/01/2017  . Pneumococcal Polysaccharide-23 06/29/2007, 08/02/2015  . Td 02/18/2003   Pertinent  Health Maintenance Due  Topic Date Due  . OPHTHALMOLOGY EXAM  03/17/1973  . PAP SMEAR  12/31/2003  . MAMMOGRAM  03/17/2013  . URINE MICROALBUMIN  03/08/2014  . FOOT EXAM  11/09/2017  . INFLUENZA VACCINE  12/30/2017  . HEMOGLOBIN A1C  06/02/2018  . COLONOSCOPY  09/02/2020      Vitals:   12/10/17 1125  BP: (!) 95/55  Pulse: 96  Resp: 20  Temp: 97.8 F (36.6 C)  TempSrc: Oral  SpO2: 95%  Weight: 254 lb 14.4 oz (115.6 kg)  Height: 5\' 6"  (1.676 m)   Body mass index is 41.14 kg/m.  Physical Exam  GENERAL APPEARANCE: Well nourished. Morbidly obese SKIN:  Left knee surgical site covered with ice pack MOUTH and THROAT: Lips are without lesions. Oral mucosa is moist and without lesions. Tongue is normal in shape, size, and color and without lesions RESPIRATORY:  Breathing is even & unlabored, BS CTAB CARDIAC: RRR, no murmur,no extra heart sounds, no edema GI: Abdomen soft, normal BS, no masses, no tenderness EXTREMITIES:  Able to move X 4 extremities NEUROLOGICAL: There is no tremor. Speech is clear, Somnolent PSYCHIATRIC:  Alert and oriented X 3. Affect and behavior are appropriate  Labs reviewed: Recent Labs    11/30/17 1405 12/07/17 0517 12/08/17 0557  NA 141 139 140  K 3.4* 3.9 3.8  CL 103 101 100  CO2 30 27 29   GLUCOSE 182* 329* 282*  BUN 12 14 16   CREATININE 0.97 1.03* 0.98  CALCIUM 9.7 8.6* 9.2   Recent Labs    11/30/17 1405  AST 20  ALT 23  ALKPHOS 152*  BILITOT 0.3  PROT 8.1  ALBUMIN 4.0   Recent Labs    12/07/17 0517 12/08/17 0557 12/09/17 0556  WBC 10.2 12.3* 9.5  HGB 12.5 12.3 11.3*  HCT 38.0 38.2 36.1  MCV 84.1 84.9 85.7  PLT 270 346 314   Lab Results  Component Value Date   TSH 5.78 (H) 11/09/2016   Lab Results  Component Value Date   HGBA1C 8.9 (H) 11/30/2017   Lab Results  Component Value Date   CHOL 187 11/09/2016   HDL 66.50 11/09/2016   LDLCALC 98 11/09/2016   LDLDIRECT 126.8 09/23/2012   TRIG 116.0 11/09/2016   CHOLHDL 3 11/09/2016    Assessment/Plan  1. Primary osteoarthritis of left knee - S/P left total knee arthroplasty, change Percocet 10-325 mg 1 tab TID to TID PRN for pain, change Tizanidine 4 mg 1 tab BID to BID PRN for muscle spasm, WBAT, follow-up with orthopedics in 2 weeks    2. Hypotension due to drugs - BP 74/46,  will hold HCTZ 25 mg 1 tab daily for SBP<110, BP/HR Q shift X 2 weeks   3. Type 2 diabetes mellitus with peripheral neuropathy (HCC) -  Lab Results  Component Value Date   HGBA1C 8.9 (H) 11/30/2017     4. Insomnia, unspecified type - will change Ambien 10 mg 1 tab Q HS to Q HS PRN   5. Depression with anxiety - mood is stable, continue Sertraline 100 mg give 2 tabs = 200 mg daily and change Ativan 1 mg QID PRN to Q 8 hours PRN   6. Encephalopathy -  thought to be related to pain medications,  Ambien, and hypotension, will change Percocet and Tizanidine to PRN and to hold Percocet and HCTZ for SBP<110    Family/ staff Communication: Discussed plan of care with patient.  Labs/tests ordered:  CBC, BMP in 1 week  Goals of care:   Short-term rehabilitation.     Durenda Age, NP [facility] [phone] (Monday-Friday 8:00 a.m. - 5:00 p.m.) [phone] (after hours)

## 2017-12-13 ENCOUNTER — Encounter: Payer: Self-pay | Admitting: Adult Health

## 2017-12-13 ENCOUNTER — Non-Acute Institutional Stay (SKILLED_NURSING_FACILITY): Payer: Managed Care, Other (non HMO) | Admitting: Adult Health

## 2017-12-13 DIAGNOSIS — E1142 Type 2 diabetes mellitus with diabetic polyneuropathy: Secondary | ICD-10-CM

## 2017-12-13 DIAGNOSIS — M159 Polyosteoarthritis, unspecified: Secondary | ICD-10-CM | POA: Insufficient documentation
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
## 2017-12-15 ENCOUNTER — Other Ambulatory Visit: Payer: Self-pay

## 2017-12-15 MED ORDER — OXYCODONE-ACETAMINOPHEN 10-325 MG PO TABS: 1.0000 | ORAL_TABLET | ORAL | 0 refills | Status: AC | PRN

## 2017-12-15 NOTE — Telephone Encounter (Signed)
Rx sent to [facility] phone : [phone] , fax : [phone]  

## 2017-12-16 ENCOUNTER — Encounter: Payer: Self-pay | Admitting: Adult Health

## 2017-12-16 ENCOUNTER — Other Ambulatory Visit
Admission: RE | Admit: 2017-12-16 | Discharge: 2017-12-16 | Disposition: A | Discharge status: Home / Self Care | Payer: Managed Care, Other (non HMO) | Source: Ambulatory Visit | Attending: Internal Medicine | Admitting: Internal Medicine

## 2017-12-16 ENCOUNTER — Non-Acute Institutional Stay (SKILLED_NURSING_FACILITY): Payer: Managed Care, Other (non HMO) | Admitting: Adult Health

## 2017-12-16 DIAGNOSIS — K5909 Other constipation: Secondary | ICD-10-CM

## 2017-12-16 DIAGNOSIS — Z96652 Presence of left artificial knee joint: Secondary | ICD-10-CM | POA: Diagnosis not present

## 2017-12-16 DIAGNOSIS — F418 Other specified anxiety disorders: Secondary | ICD-10-CM

## 2017-12-16 DIAGNOSIS — I10 Essential (primary) hypertension: Secondary | ICD-10-CM

## 2017-12-16 DIAGNOSIS — Z96659 Presence of unspecified artificial knee joint: Secondary | ICD-10-CM | POA: Diagnosis not present

## 2017-12-16 DIAGNOSIS — E876 Hypokalemia: Secondary | ICD-10-CM

## 2017-12-16 DIAGNOSIS — J4520 Mild intermittent asthma, uncomplicated: Secondary | ICD-10-CM | POA: Diagnosis not present

## 2017-12-16 DIAGNOSIS — E1142 Type 2 diabetes mellitus with diabetic polyneuropathy: Secondary | ICD-10-CM | POA: Diagnosis not present

## 2017-12-16 DIAGNOSIS — M1712 Unilateral primary osteoarthritis, left knee: Secondary | ICD-10-CM | POA: Diagnosis not present

## 2017-12-16 LAB — CBC WITH DIFFERENTIAL/PLATELET
BASOS ABS: 0.1 10*3/uL (ref 0–0.1)
Basophils Relative: 1 %
EOS ABS: 0.2 10*3/uL (ref 0–0.7)
EOS PCT: 2 %
HCT: 33.7 % — ABNORMAL LOW (ref 35.0–47.0)
Hemoglobin: 11.6 g/dL — ABNORMAL LOW (ref 12.0–16.0)
LYMPHS ABS: 1.9 10*3/uL (ref 1.0–3.6)
Lymphocytes Relative: 21 %
MCH: 28.4 pg (ref 26.0–34.0)
MCHC: 34.5 g/dL (ref 32.0–36.0)
MCV: 82.3 fL (ref 80.0–100.0)
Monocytes Absolute: 0.6 10*3/uL (ref 0.2–0.9)
Monocytes Relative: 6 %
Neutro Abs: 6.4 10*3/uL (ref 1.4–6.5)
Neutrophils Relative %: 70 %
PLATELETS: 432 10*3/uL (ref 150–440)
RBC: 4.09 MIL/uL (ref 3.80–5.20)
RDW: 14 % (ref 11.5–14.5)
WBC: 9 10*3/uL (ref 3.6–11.0)

## 2017-12-16 LAB — BRAIN NATRIURETIC PEPTIDE: B NATRIURETIC PEPTIDE 5: 26 pg/mL (ref 0.0–100.0)
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
## 2017-12-19 ENCOUNTER — Other Ambulatory Visit
Admission: RE | Admit: 2017-12-19 | Discharge: 2017-12-19 | Disposition: A | Discharge status: Home / Self Care | Payer: Managed Care, Other (non HMO) | Source: Skilled Nursing Facility | Attending: Internal Medicine | Admitting: Internal Medicine

## 2017-12-19 DIAGNOSIS — R300 Dysuria: Secondary | ICD-10-CM | POA: Insufficient documentation

## 2017-12-19 DIAGNOSIS — R350 Frequency of micturition: Secondary | ICD-10-CM | POA: Diagnosis present

## 2017-12-19 LAB — URINALYSIS, COMPLETE (UACMP) WITH MICROSCOPIC
Bilirubin Urine: NEGATIVE
Glucose, UA: NEGATIVE mg/dL
Ketones, ur: NEGATIVE mg/dL
Nitrite: NEGATIVE
Protein, ur: NEGATIVE mg/dL
Specific Gravity, Urine: 1.005 (ref 1.005–1.030)
WBC, UA: >50 WBC/hpf — ABNORMAL HIGH (ref 0–5)
pH: 6 (ref 5.0–8.0)

## 2017-12-22 LAB — URINE CULTURE: Culture: >=100000 — AB

## 2017-12-26 DIAGNOSIS — K5900 Constipation, unspecified: Secondary | ICD-10-CM | POA: Insufficient documentation

## 2017-12-26 DIAGNOSIS — Z96652 Presence of left artificial knee joint: Secondary | ICD-10-CM | POA: Insufficient documentation

## 2017-12-26 DIAGNOSIS — K5909 Other constipation: Secondary | ICD-10-CM | POA: Insufficient documentation

## 2017-12-28 DIAGNOSIS — M1712 Unilateral primary osteoarthritis, left knee: Secondary | ICD-10-CM | POA: Diagnosis not present

## 2017-12-28 DIAGNOSIS — M25562 Pain in left knee: Secondary | ICD-10-CM | POA: Diagnosis not present

## 2017-12-30 DIAGNOSIS — M25562 Pain in left knee: Secondary | ICD-10-CM | POA: Diagnosis not present

## 2018-01-03 DIAGNOSIS — M25562 Pain in left knee: Secondary | ICD-10-CM | POA: Diagnosis not present

## 2018-01-05 DIAGNOSIS — M25562 Pain in left knee: Secondary | ICD-10-CM | POA: Diagnosis not present

## 2018-01-07 DIAGNOSIS — M25562 Pain in left knee: Secondary | ICD-10-CM | POA: Diagnosis not present

## 2018-01-10 DIAGNOSIS — M25562 Pain in left knee: Secondary | ICD-10-CM | POA: Diagnosis not present

## 2018-01-11 DIAGNOSIS — Z96652 Presence of left artificial knee joint: Secondary | ICD-10-CM | POA: Diagnosis not present

## 2018-01-11 DIAGNOSIS — Z4789 Encounter for other orthopedic aftercare: Secondary | ICD-10-CM | POA: Diagnosis not present

## 2018-01-12 DIAGNOSIS — M25562 Pain in left knee: Secondary | ICD-10-CM | POA: Diagnosis not present

## 2018-01-14 DIAGNOSIS — M25562 Pain in left knee: Secondary | ICD-10-CM | POA: Diagnosis not present

## 2018-01-17 DIAGNOSIS — M25562 Pain in left knee: Secondary | ICD-10-CM | POA: Diagnosis not present

## 2018-01-19 DIAGNOSIS — M25562 Pain in left knee: Secondary | ICD-10-CM | POA: Diagnosis not present

## 2018-01-25 ENCOUNTER — Ambulatory Visit (INDEPENDENT_AMBULATORY_CARE_PROVIDER_SITE_OTHER): Payer: Managed Care, Other (non HMO) | Admitting: Internal Medicine

## 2018-01-25 ENCOUNTER — Encounter: Payer: Self-pay | Admitting: Internal Medicine

## 2018-01-25 VITALS — BP 112/70 | HR 76 | Ht 67.0 in | Wt 254.8 lb

## 2018-01-25 DIAGNOSIS — E1142 Type 2 diabetes mellitus with diabetic polyneuropathy: Secondary | ICD-10-CM | POA: Diagnosis not present

## 2018-01-25 DIAGNOSIS — E785 Hyperlipidemia, unspecified: Secondary | ICD-10-CM | POA: Diagnosis not present

## 2018-01-25 LAB — POCT GLYCOSYLATED HEMOGLOBIN (HGB A1C): Hemoglobin A1C: 7.3 % — AB (ref 4.0–5.6)

## 2018-01-25 MED ORDER — GLUCOSE BLOOD VI STRP: ORAL_STRIP | 3 refills | Status: DC

## 2018-01-25 MED ORDER — "INSULIN SYRINGE-NEEDLE U-100 31G X 5/16"" 0.5 ML MISC": 3 refills | Status: DC

## 2018-01-25 NOTE — Patient Instructions (Addendum)
Please continue: - Levemir 50 units daily at bedtime - Humulin R insulin:  20-24 units before b'fast  15-20 units before lunch 15(-20) units before dinner If you plan to be more active after a meal, please reduce the insulin before that meal by 5 units.  Please come back for a follow-up appointment in 3-4 months.
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
## 2018-01-28 DIAGNOSIS — M25562 Pain in left knee: Secondary | ICD-10-CM | POA: Diagnosis not present

## 2018-02-02 ENCOUNTER — Other Ambulatory Visit: Payer: Self-pay | Admitting: *Deleted

## 2018-02-02 DIAGNOSIS — M25562 Pain in left knee: Secondary | ICD-10-CM | POA: Diagnosis not present

## 2018-02-02 MED ORDER — POTASSIUM CHLORIDE CRYS ER 10 MEQ PO TBCR: 10.0000 meq | EXTENDED_RELEASE_TABLET | Freq: Every day | ORAL | 1 refills | Status: DC

## 2018-02-04 DIAGNOSIS — M25562 Pain in left knee: Secondary | ICD-10-CM | POA: Diagnosis not present

## 2018-02-08 DIAGNOSIS — M25562 Pain in left knee: Secondary | ICD-10-CM | POA: Diagnosis not present

## 2018-02-10 DIAGNOSIS — M25562 Pain in left knee: Secondary | ICD-10-CM | POA: Diagnosis not present

## 2018-02-14 DIAGNOSIS — M25562 Pain in left knee: Secondary | ICD-10-CM | POA: Diagnosis not present

## 2018-02-17 DIAGNOSIS — M1712 Unilateral primary osteoarthritis, left knee: Secondary | ICD-10-CM | POA: Diagnosis not present

## 2018-02-18 DIAGNOSIS — Z79899 Other long term (current) drug therapy: Secondary | ICD-10-CM | POA: Diagnosis not present

## 2018-02-18 DIAGNOSIS — M545 Low back pain: Secondary | ICD-10-CM | POA: Diagnosis not present

## 2018-02-21 ENCOUNTER — Other Ambulatory Visit: Payer: Self-pay | Admitting: Internal Medicine

## 2018-02-22 DIAGNOSIS — M25562 Pain in left knee: Secondary | ICD-10-CM | POA: Diagnosis not present

## 2018-02-24 DIAGNOSIS — M1712 Unilateral primary osteoarthritis, left knee: Secondary | ICD-10-CM | POA: Diagnosis not present

## 2018-03-03 DIAGNOSIS — M1712 Unilateral primary osteoarthritis, left knee: Secondary | ICD-10-CM | POA: Diagnosis not present

## 2018-03-16 DIAGNOSIS — M1712 Unilateral primary osteoarthritis, left knee: Secondary | ICD-10-CM | POA: Diagnosis not present

## 2018-03-24 DIAGNOSIS — M25562 Pain in left knee: Secondary | ICD-10-CM | POA: Diagnosis not present

## 2018-03-28 DIAGNOSIS — M25562 Pain in left knee: Secondary | ICD-10-CM | POA: Diagnosis not present

## 2018-03-30 DIAGNOSIS — M25562 Pain in left knee: Secondary | ICD-10-CM | POA: Diagnosis not present

## 2018-04-12 DIAGNOSIS — M25562 Pain in left knee: Secondary | ICD-10-CM | POA: Diagnosis not present

## 2018-04-13 DIAGNOSIS — M25662 Stiffness of left knee, not elsewhere classified: Secondary | ICD-10-CM | POA: Diagnosis not present

## 2018-04-21 DIAGNOSIS — M25562 Pain in left knee: Secondary | ICD-10-CM | POA: Diagnosis not present

## 2018-04-25 DIAGNOSIS — M25562 Pain in left knee: Secondary | ICD-10-CM | POA: Diagnosis not present

## 2018-05-02 ENCOUNTER — Other Ambulatory Visit: Payer: Self-pay

## 2018-05-02 MED ORDER — ONETOUCH VERIO FLEX SYSTEM W/DEVICE KIT: 1.0000 | PACK | Freq: Once | 0 refills | Status: AC

## 2018-05-06 DIAGNOSIS — Z23 Encounter for immunization: Secondary | ICD-10-CM | POA: Diagnosis not present

## 2018-05-11 ENCOUNTER — Other Ambulatory Visit: Payer: Self-pay | Admitting: *Deleted

## 2018-05-11 MED ORDER — HYDROCHLOROTHIAZIDE 25 MG PO TABS: 25.0000 mg | ORAL_TABLET | Freq: Every day | ORAL | 1 refills | Status: DC

## 2018-05-27 ENCOUNTER — Encounter: Payer: Self-pay | Admitting: Internal Medicine

## 2018-05-27 ENCOUNTER — Ambulatory Visit (INDEPENDENT_AMBULATORY_CARE_PROVIDER_SITE_OTHER): Payer: Managed Care, Other (non HMO) | Admitting: Internal Medicine

## 2018-05-27 VITALS — HR 84 | Ht 67.0 in | Wt 255.0 lb

## 2018-05-27 DIAGNOSIS — E785 Hyperlipidemia, unspecified: Secondary | ICD-10-CM | POA: Diagnosis not present

## 2018-05-27 DIAGNOSIS — E1142 Type 2 diabetes mellitus with diabetic polyneuropathy: Secondary | ICD-10-CM | POA: Diagnosis not present

## 2018-05-27 LAB — POCT GLYCOSYLATED HEMOGLOBIN (HGB A1C): HEMOGLOBIN A1C: 7.5 % — AB (ref 4.0–5.6)
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
## 2018-05-27 NOTE — Patient Instructions (Addendum)
Please continue: - Levemir 50 units at bedtime  Please decrease: - Humulin R insulin:  15-20 units before b'fast  12-15 units before lunch 12-15 units before dinner If you plan to be more active after a meal, please reduce the insulin before that meal by 5 units.  Please come back for a follow-up appointment in 3-4 months.

## 2018-05-28 LAB — LIPID PANEL
Cholesterol: 191 mg/dL (ref <200)
HDL: 56 mg/dL (ref >50)
LDL CHOLESTEROL (CALC): 101 mg/dL — AB
NON-HDL CHOLESTEROL (CALC): 135 mg/dL — AB (ref <130)
Total CHOL/HDL Ratio: 3.4 (calc) (ref <5.0)
Triglycerides: 220 mg/dL — ABNORMAL HIGH (ref <150)

## 2018-05-28 LAB — MICROALBUMIN / CREATININE URINE RATIO
Creatinine, Urine: 142 mg/dL (ref 20–275)
Microalb Creat Ratio: 11 mcg/mg creat (ref <30)
Microalb, Ur: 1.6 mg/dL

## 2018-06-02 DIAGNOSIS — M25562 Pain in left knee: Secondary | ICD-10-CM | POA: Diagnosis not present

## 2018-06-02 DIAGNOSIS — S8002XA Contusion of left knee, initial encounter: Secondary | ICD-10-CM | POA: Diagnosis not present

## 2018-06-10 ENCOUNTER — Ambulatory Visit: Payer: Managed Care, Other (non HMO) | Admitting: Endocrinology

## 2018-06-15 ENCOUNTER — Other Ambulatory Visit: Payer: Self-pay | Admitting: Internal Medicine

## 2018-06-16 ENCOUNTER — Telehealth: Payer: Self-pay | Admitting: Internal Medicine

## 2018-06-16 NOTE — Telephone Encounter (Signed)
Patient stated her insurance will cover LANTUS insulin and would like a prescription sent into her pharmacy  Patient is almost out of her insulin and would like this sent in as soon as we can.   [id number] - [place], [place] - [address]

## 2018-06-16 NOTE — Telephone Encounter (Signed)
Dr. Cruzita Lederer please advise, I do not see Lantus on her list.

## 2018-06-16 NOTE — Telephone Encounter (Signed)
Please change from Levemir to Lantus same dose as per my last note.

## 2018-06-17 MED ORDER — INSULIN GLARGINE 100 UNIT/ML ~~LOC~~ SOLN: 50.0000 [IU] | Freq: Every day | SUBCUTANEOUS | 11 refills | Status: DC

## 2018-06-17 NOTE — Telephone Encounter (Signed)
RX changed from Levemir to Lantus.  RX sent.

## 2018-06-17 NOTE — Addendum Note (Signed)
Addended by: Cardell Peach I on: 06/17/2018 10:35 AM   Modules accepted: Orders

## 2018-07-14 DIAGNOSIS — S80212D Abrasion, left knee, subsequent encounter: Secondary | ICD-10-CM | POA: Diagnosis not present

## 2018-07-26 DIAGNOSIS — M25562 Pain in left knee: Secondary | ICD-10-CM | POA: Diagnosis not present

## 2018-07-26 DIAGNOSIS — M25662 Stiffness of left knee, not elsewhere classified: Secondary | ICD-10-CM | POA: Diagnosis not present

## 2018-08-05 DIAGNOSIS — M25562 Pain in left knee: Secondary | ICD-10-CM | POA: Diagnosis not present

## 2018-08-08 ENCOUNTER — Other Ambulatory Visit: Payer: Self-pay | Admitting: Family Medicine

## 2018-08-10 DIAGNOSIS — M25562 Pain in left knee: Secondary | ICD-10-CM | POA: Diagnosis not present

## 2018-08-12 DIAGNOSIS — M25562 Pain in left knee: Secondary | ICD-10-CM | POA: Diagnosis not present

## 2018-08-15 DIAGNOSIS — M25562 Pain in left knee: Secondary | ICD-10-CM | POA: Diagnosis not present

## 2018-08-18 ENCOUNTER — Telehealth: Payer: Self-pay

## 2018-08-18 NOTE — Telephone Encounter (Signed)
Pt called back and said the pharmacy will not do it right now during social distancing. The nurse visits today are blocked due to coverage issues. Please advise.

## 2018-08-18 NOTE — Telephone Encounter (Signed)
Pt called to see when her last tetanus was done. She has stepped on a nail. I advised her 2004. Advised her she needs a Tdap vaccine. We did not have availability on the Nurse schedule this afternoon so I advised her to go to a local pharmacy. She will call us if she is unable to go get one there.

## 2018-08-18 NOTE — Telephone Encounter (Signed)
Put her in for a quick am appt with me on Monday (sorry I am out till then) - I can order the tetanus shot and charge a level one visit  Thanks  If signs of infection-needs to be seen

## 2018-08-22 ENCOUNTER — Other Ambulatory Visit: Payer: Self-pay

## 2018-08-22 ENCOUNTER — Encounter: Payer: Self-pay | Admitting: Family Medicine

## 2018-08-22 ENCOUNTER — Ambulatory Visit (INDEPENDENT_AMBULATORY_CARE_PROVIDER_SITE_OTHER): Payer: No Typology Code available for payment source | Admitting: Family Medicine

## 2018-08-22 VITALS — BP 132/72 | HR 74 | Temp 98.3°F | Ht 67.0 in | Wt 256.1 lb

## 2018-08-22 DIAGNOSIS — E1142 Type 2 diabetes mellitus with diabetic polyneuropathy: Secondary | ICD-10-CM | POA: Diagnosis not present

## 2018-08-22 DIAGNOSIS — S91331A Puncture wound without foreign body, right foot, initial encounter: Secondary | ICD-10-CM

## 2018-08-22 DIAGNOSIS — L409 Psoriasis, unspecified: Secondary | ICD-10-CM | POA: Diagnosis not present

## 2018-08-22 DIAGNOSIS — S91339A Puncture wound without foreign body, unspecified foot, initial encounter: Secondary | ICD-10-CM | POA: Insufficient documentation

## 2018-08-22 DIAGNOSIS — Z23 Encounter for immunization: Secondary | ICD-10-CM

## 2018-08-22 MED ORDER — AMOXICILLIN-POT CLAVULANATE 875-125 MG PO TABS: 1.0000 | ORAL_TABLET | Freq: Two times a day (BID) | ORAL | 0 refills | Status: DC

## 2018-08-22 MED ORDER — TRIAMCINOLONE ACETONIDE 0.1 % EX CREA
1.0000 | TOPICAL_CREAM | Freq: Two times a day (BID) | CUTANEOUS | 0 refills | Status: DC
Start: 2018-08-22 — End: 2023-04-13

## 2018-08-22 NOTE — Progress Notes (Signed)
Subjective:    Patient ID: Alexa Woods, female    DOB: 06/22/1960, 58 y.o.   MRN: [id number]  HPI Here with nail injury to the foot (R)   Last tetanus shot 9/04  Wt Readings from Last 3 Encounters:  08/22/18 256 lb 1 oz (116.1 kg)  05/27/18 255 lb (115.7 kg)  01/25/18 254 lb 12.8 oz (115.6 kg)   40.11 kg/m   Was in her yard (late last week(  She was wearing an athletic shoe (old one)  Nail may have had a little rust  Did not bleed a lot -just got sore  Used peroxide on it  Used soap and water   No drainage or pus  Cannot see redness Has neuropathy -cannot feel well in feet  Some soreness- not severe   No fever  Feeling ok   Diabetes - getting low readings lately- will call her endocrinologist to lower the dose of her insulin      Has psoriasis on her palms  Uses lotion/and vaseline  Wants to wait on dermatology referral   Patient Active Problem List   Diagnosis Date Noted  . Puncture wound of foot 08/22/2018  . Chronic constipation 12/26/2017  . History of left knee replacement 12/26/2017  . Primary osteoarthritis of left knee 12/06/2017  . Pre-operative exam 04/05/2017  . Class 3 obesity with serious comorbidity and body mass index (BMI) of 40.0 to 44.9 in adult 01/21/2017  . Dysuria 08/02/2015  . LUQ abdominal pain 07/19/2014  . Joint swelling 12/20/2013  . Leg pain, bilateral 11/28/2013  . Periodontal disease 02/03/2013  . Hyperlipidemia, mild 09/27/2012  . DYSPHAGIA UNSPECIFIED 08/07/2010  . Low back pain 07/22/2010  . Hypokalemia 12/12/2008  . Morbid obesity ([place]) 03/30/2008  . 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  . Type 2 diabetes mellitus with peripheral neuropathy ([place]) 11/30/2006  . Anxiety 11/30/2006  . History of alcohol abuse 11/30/2006  . Depression with anxiety 11/30/2006  . Essential hypertension 11/30/2006  . HEMORRHOIDS 11/30/2006  . ALLERGIC RHINITIS 11/30/2006  . Asthma 11/30/2006  . GERD 11/30/2006  . Psoriasis 11/30/2006   Past Medical History:  Diagnosis Date  . Alcohol abuse, in remission    since 1998  . Allergic rhinitis   . Anxiety   . Asthma   . Bilateral lower extremity edema   . Bulging lumbar disc   . Bulging of cervical intervertebral disc   . Chronic constipation   . DDD (degenerative disc disease), lumbosacral   . Depression   . Dyspnea   . GERD (gastroesophageal reflux disease)   . Hemorrhoids   . History of Bell's palsy 08/2007   left  . History of chronic cystitis   . IBS (irritable bowel syndrome)   . Insulin dependent type 2 diabetes mellitus [facility])    endocrinologist-- dr Cruzita Lederer  . Lower urinary tract symptoms (LUTS)   . OA (osteoarthritis)    knees, back, hands, elbows  . OSA (obstructive sleep apnea)    per study 06-08-2017 mild osa , cpap recommended , per pt insurance issue  . Peripheral neuropathy   . PONV (postoperative nausea and vomiting)   . Psoriasis   . S/P dilatation of esophageal stricture   . Unspecified essential hypertension   . Wears partial dentures    lower   Past Surgical History:  Procedure Laterality Date  . CHOLECYSTECTOMY OPEN  1990   AND APPENDECTOMY  . DOBUTAMINE STRESS ECHO  2009    normal stress echo, no evidence of  ischemia  . ELBOW SURGERY Bilateral RIGHT 2013;  LEFT 2016   for nerve damage  . ESOPHAGOGASTRODUODENOSCOPY  07/2002   erythematous gastropathy  . KNEE ARTHROSCOPY Left 11/ 2018   dr Wynelle Link  @ [facility]  . KNEE ARTHROSCOPY W/ PARTIAL MEDIAL MENISCECTOMY Right 10/2008   and chondroplasty  . SHOULDER ARTHROSCOPY WITH DISTAL CLAVICLE RESECTION Left 12/ 2011   dr dean  . TOTAL KNEE ARTHROPLASTY Right 07-01-2009  dr Wynelle Link   [facility]  . TOTAL KNEE ARTHROPLASTY Left 12/06/2017   Procedure: LEFT TOTAL LEFT KNEE ARTHROPLASTY;  Surgeon: Gaynelle Arabian, MD;  Location: [facility];  Service: Orthopedics;  Laterality: Left;  Marland Kitchen VAGINAL HYSTERECTOMY  2003   Social History   Tobacco Use  . Smoking status: Former Smoker    Years: 25.00    Types:  Cigarettes    Last attempt to quit: 12/30/2001    Years since quitting: 16.6  . Smokeless tobacco: Never Used  Substance Use Topics  . Alcohol use: No    Alcohol/week: 0.0 standard drinks    Comment: hx alcoholism--  stopped 1998   . Drug use: No   Family History  Problem Relation Age of Onset  . Alcohol abuse Mother   . Hypertension Mother   . Esophageal cancer Maternal Grandmother   . Lung cancer Unknown        mat great uncle  . Colon cancer Neg Hx    Allergies  Allergen Reactions  . Bupropion Hcl Other (See Comments)    Pt is unsure  . Cefuroxime Axetil Nausea Only  . Darvon [Propoxyphene]     wheezing  . Gabapentin     Pt does not remember reaction   . Lidocaine     REACTION: unknown  . Metformin     REACTION: GI  . Paroxetine     REACTION: doesn't agree  . Tramadol Hcl     REACTION: Causes Anxiety  . Codeine Nausea And Vomiting and Rash  . Sulfa Antibiotics Rash   Current Outpatient Medications on File Prior to Visit  Medication Sig Dispense Refill  . acetaminophen (TYLENOL) 325 MG tablet Take 650 mg by mouth every 4 (four) hours as needed for mild pain or fever. for pain/ increased temp. May be administered orally, per G-tube if needed or rectally if unable to swallow (separate order). Maximum dose for 24 hours is 3,000 mg from all sources of Acetaminophen/ Tylenol    . albuterol (PROVENTIL HFA;VENTOLIN HFA) 108 (90 Base) MCG/ACT inhaler Inhale 2 puffs into the lungs every 6 (six) hours as needed for wheezing or shortness of breath. 18 g 1  . aspirin 325 MG EC tablet Take 325 mg by mouth daily.    . beclomethasone (QVAR) 80 MCG/ACT inhaler Inhale 2 puffs 2 (two) times daily into the lungs. Rinse mouth after use. 1 Inhaler 5  . bisacodyl (DULCOLAX) 10 MG suppository Place 1 suppository (10 mg total) rectally daily as needed for moderate constipation. 12 suppository 0  . docusate sodium (COLACE) 100 MG capsule Take 100 mg by mouth every morning.     . fluticasone  (FLONASE) 50 MCG/ACT nasal spray Place 2 sprays into both nostrils daily as needed for allergies or rhinitis.    Marland Kitchen glucose blood (ONE TOUCH ULTRA TEST) test strip Use to test blood sugar 4 times daily as instructed. 300 each 3  . hydrochlorothiazide (HYDRODIURIL) 25 MG tablet Take 1 tablet (25 mg total) by mouth daily. 90 tablet 1  . hydrocortisone (ANUSOL-HC) 25  MG suppository hydrocortisone acetate 25 mg rectal suppository   25 mg by rectal route.    . insulin glargine (LANTUS) 100 UNIT/ML injection Inject 0.5 mLs (50 Units total) into the skin daily. 10 mL 11  . insulin regular (NOVOLIN R,HUMULIN R) 100 units/mL injection Inject 10-20 Units into the skin 3 (three) times daily before meals.    . Insulin Syringe-Needle U-100 31G X 5/16" 0.5 ML MISC USE THREE TIMES PER DAY WITH R INSULIN & ONCE DAILY WITH LEVEMIR 100 each 11  . KLOR-CON M10 10 MEQ tablet TAKE 1 TABLET BY MOUTH EVERY DAY 90 tablet 0  . Lancets (ONETOUCH ULTRASOFT) lancets Use to test blood sugar 4 times daily as instructed. 125 each 2  . LORazepam (ATIVAN) 1 MG tablet Take 1 mg by mouth every 8 (eight) hours as needed for anxiety or sleep.     . methocarbamol (ROBAXIN) 500 MG tablet Take 1 tablet (500 mg total) by mouth every 6 (six) hours as needed for muscle spasms. 40 tablet 0  . ondansetron (ZOFRAN) 4 MG tablet Take 1 tablet (4 mg total) by mouth every 6 (six) hours as needed for nausea. 20 tablet 0  . oxyCODONE-acetaminophen (PERCOCET) 10-325 MG tablet Take 1 tablet by mouth every 4 (four) hours as needed for pain. Hold for SBP < = 105 12 tablet 0  . OXYGEN Inhale 2 L into the lungs as needed.    . pantoprazole (PROTONIX) 40 MG tablet Take 1 tablet (40 mg total) by mouth 2 (two) times daily. 180 tablet 3  . perphenazine (TRILAFON) 2 MG tablet Take 2 mg by mouth at bedtime.    . polyethylene glycol (MIRALAX / GLYCOLAX) packet Take 17 g by mouth daily as needed.    . senna (SENOKOT) 8.6 MG TABS tablet Take 2 tablets by mouth 2  (two) times daily.    . sertraline (ZOLOFT) 100 MG tablet Take 200 mg by mouth every morning.     Marland Kitchen tiZANidine (ZANAFLEX) 4 MG tablet Take 4 mg by mouth 2 (two) times daily as needed.     Marland Kitchen UNABLE TO FIND Diet type : NCS    . zolpidem (AMBIEN) 10 MG tablet Take 10 mg by mouth at bedtime as needed.      No current facility-administered medications on file prior to visit.     Review of Systems  Constitutional: Negative for activity change, appetite change, fatigue, fever and unexpected weight change.  HENT: Negative for congestion, ear pain, rhinorrhea, sinus pressure and sore throat.   Eyes: Negative for pain, redness and visual disturbance.  Respiratory: Negative for cough, shortness of breath and wheezing.   Cardiovascular: Negative for chest pain and palpitations.  Gastrointestinal: Negative for abdominal pain, blood in stool, constipation and diarrhea.  Endocrine: Negative for polydipsia and polyuria.  Genitourinary: Negative for dysuria, frequency and urgency.  Musculoskeletal: Positive for arthralgias. Negative for back pain and myalgias.       L knee still hurts after surgery In PT  Skin: Positive for rash and wound. Negative for pallor.  Allergic/Immunologic: Negative for environmental allergies.  Neurological: Negative for dizziness, syncope and headaches.  Hematological: Negative for adenopathy. Does not bruise/bleed easily.  Psychiatric/Behavioral: Negative for decreased concentration and dysphoric mood. The patient is not nervous/anxious.        Objective:   Physical Exam Constitutional:      General: She is not in acute distress.    Appearance: She is obese. She is not ill-appearing.  HENT:  Head: Normocephalic and atraumatic.     Mouth/Throat:     Mouth: Mucous membranes are moist.     Pharynx: Oropharynx is clear.  Eyes:     Extraocular Movements: Extraocular movements intact.     Conjunctiva/sclera: Conjunctivae normal.     Pupils: Pupils are equal, round,  and reactive to light.  Cardiovascular:     Rate and Rhythm: Normal rate and regular rhythm.     Pulses: Normal pulses.     Heart sounds: Normal heart sounds.  Skin:    General: Skin is warm and dry.     Coloration: Skin is not pale.     Findings: No erythema.     Comments: Psoriasis rash on thenar areas of palms - scale with erythema  Small wood splinter (1 mm) removed from lateral plantar surface of R foot (at the site of prior puncture wound) No redness or swelling No drainage   Neurological:     Mental Status: She is alert. Mental status is at baseline.     Coordination: Coordination normal.     Deep Tendon Reflexes: Reflexes normal.  Psychiatric:        Mood and Affect: Mood normal.           Assessment & Plan:   Problem List Items Addressed This Visit      Musculoskeletal and Integument   Psoriasis    On palms  Not able to get into dermatology right now  Continue moisturizers and vaseline if helpful /keep clean Px triamcinolon3 0.1% to try on affected areas  Update if not starting to improve in a week or if worsening          Other   Morbid obesity ([place])    Encouraged healthy diet and exercise  She will check in with her endocrinologist re: reducing insulin so she can be more active In PT for her L knee surgery currently      Puncture wound of foot - Primary    Lateral R foot  Has neuropathy  Nail was? Not new or old / went through shoe  No signs of infection  Very small wood splinter removed with needle in sterile fashion  Disc soap and water cleanse/ watch for redness or swelling or drainage  augmentin px for bid /7 days  Tdap vaccine today

## 2018-08-22 NOTE — Assessment & Plan Note (Signed)
Encouraged healthy diet and exercise  She will check in with her endocrinologist re: reducing insulin so she can be more active In PT for her L knee surgery currently

## 2018-08-22 NOTE — Assessment & Plan Note (Signed)
Lateral R foot  Has neuropathy  Nail was? Not new or old / went through shoe  No signs of infection  Very small wood splinter removed with needle in sterile fashion  Disc soap and water cleanse/ watch for redness or swelling or drainage  augmentin px for bid /7 days  Tdap vaccine today

## 2018-08-22 NOTE — Assessment & Plan Note (Signed)
On palms  Not able to get into dermatology right now  Continue moisturizers and vaseline if helpful /keep clean Px triamcinolon3 0.1% to try on affected areas  Update if not starting to improve in a week or if worsening

## 2018-08-22 NOTE — Assessment & Plan Note (Signed)
Continues f/u with endocrinology- may need to decrease insulin dose

## 2018-08-22 NOTE — Patient Instructions (Addendum)
Take care of your feet  Keep the wound very clean with soap and water  Stop peroxide   Watch for redness or swelling or drainage There was a tiny wood splinter that was removed   Take augmentin as directed  Tetanus shot today   Use the triamcinolone on hands for psoriasis if needed

## 2018-08-23 ENCOUNTER — Telehealth: Payer: Self-pay | Admitting: Internal Medicine

## 2018-08-23 NOTE — Telephone Encounter (Signed)
Form filled out and faxed 

## 2018-08-23 NOTE — Telephone Encounter (Signed)
[facility] is refaxing a document to refill patients diabetic supplies.

## 2018-08-29 ENCOUNTER — Telehealth: Payer: Self-pay | Admitting: Internal Medicine

## 2018-08-29 NOTE — Telephone Encounter (Signed)
[employer] is calling into a fax about the RX for a [employer], faxed on 08/26/18.

## 2018-08-30 DIAGNOSIS — L4050 Arthropathic psoriasis, unspecified: Secondary | ICD-10-CM | POA: Diagnosis not present

## 2018-08-30 NOTE — Telephone Encounter (Signed)
Form faxed again.

## 2018-09-01 DIAGNOSIS — M25662 Stiffness of left knee, not elsewhere classified: Secondary | ICD-10-CM | POA: Diagnosis not present

## 2018-09-01 DIAGNOSIS — M25562 Pain in left knee: Secondary | ICD-10-CM | POA: Diagnosis not present

## 2018-09-02 NOTE — Telephone Encounter (Signed)
[employer] has called stating the form was rejected. And it needs to be corrected with initial and dates on testing frequency.

## 2018-09-05 NOTE — Telephone Encounter (Signed)
The form we sent was filled out correctly. Per last office visit Dr. Cruzita Lederer documented that patient is checking her sugar 1-3 times a day and after visit instructions state to test 3 times a day.   We can not send the form stating she checks 4 times a day because the chart notes do not back it up.

## 2018-09-06 NOTE — Telephone Encounter (Signed)
I did fax the chart notes, twice. I will fax them again.

## 2018-09-06 NOTE — Telephone Encounter (Signed)
[employer] has called requesting a faxed copy of patients chart notes stating the information below.

## 2018-09-07 NOTE — Telephone Encounter (Signed)
[facility] has called again stating there is another correction that needs to be made. Stated it should not say test 1-3 times a day it needs to say 3. They will be refaxing paperwork.

## 2018-09-08 NOTE — Telephone Encounter (Signed)
Filled out the forms again and faxed.

## 2018-09-12 ENCOUNTER — Telehealth: Payer: Self-pay | Admitting: Internal Medicine

## 2018-09-12 NOTE — Telephone Encounter (Signed)
Per [facility], " Joann w/ [employer], checking pm status on pt. Call back [phone]"

## 2018-09-12 NOTE — Telephone Encounter (Signed)
Forms were just received today, I called and let them know it will be completed and faxed today.

## 2018-09-30 ENCOUNTER — Ambulatory Visit: Payer: Managed Care, Other (non HMO) | Admitting: Internal Medicine

## 2018-10-04 ENCOUNTER — Ambulatory Visit (INDEPENDENT_AMBULATORY_CARE_PROVIDER_SITE_OTHER): Payer: No Typology Code available for payment source | Admitting: Internal Medicine

## 2018-10-04 ENCOUNTER — Encounter: Payer: Self-pay | Admitting: Internal Medicine

## 2018-10-04 DIAGNOSIS — E785 Hyperlipidemia, unspecified: Secondary | ICD-10-CM

## 2018-10-04 DIAGNOSIS — E1142 Type 2 diabetes mellitus with diabetic polyneuropathy: Secondary | ICD-10-CM | POA: Diagnosis not present

## 2018-10-04 MED ORDER — INSULIN GLARGINE 100 UNIT/ML ~~LOC~~ SOLN: 40.0000 [IU] | Freq: Every day | SUBCUTANEOUS | 3 refills | Status: DC

## 2018-10-04 MED ORDER — ONETOUCH ULTRASOFT LANCETS MISC: 11 refills | Status: AC

## 2018-10-04 NOTE — Patient Instructions (Addendum)
Please decrease: - Lantus to 40 units at bedtime - Humulin R insulin:  10-12 units before b'fast  10-12 units before lunch 10-12 units before dinner If you plan to be more active after a meal, please reduce the insulin before that meal by 5 units.  Please let me know if you still have low blood sugars in 2 weeks.  Please come back for a follow-up appointment in 3 to 4 months.
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
## 2018-10-29 ENCOUNTER — Other Ambulatory Visit: Payer: Self-pay | Admitting: Family Medicine

## 2018-12-27 DIAGNOSIS — G894 Chronic pain syndrome: Secondary | ICD-10-CM | POA: Diagnosis not present

## 2018-12-27 DIAGNOSIS — Z5181 Encounter for therapeutic drug level monitoring: Secondary | ICD-10-CM | POA: Diagnosis not present

## 2018-12-27 DIAGNOSIS — Z79899 Other long term (current) drug therapy: Secondary | ICD-10-CM | POA: Diagnosis not present

## 2019-01-09 ENCOUNTER — Emergency Department (HOSPITAL_COMMUNITY)
Admission: EM | Admit: 2019-01-09 | Discharge: 2019-01-09 | Disposition: A | Discharge status: Home / Self Care | Payer: No Typology Code available for payment source | Attending: Emergency Medicine | Admitting: Emergency Medicine

## 2019-01-09 ENCOUNTER — Other Ambulatory Visit: Payer: Self-pay

## 2019-01-09 ENCOUNTER — Emergency Department (HOSPITAL_COMMUNITY): Payer: No Typology Code available for payment source

## 2019-01-09 ENCOUNTER — Encounter (HOSPITAL_COMMUNITY): Payer: Self-pay | Admitting: Emergency Medicine

## 2019-01-09 DIAGNOSIS — Y929 Unspecified place or not applicable: Secondary | ICD-10-CM | POA: Insufficient documentation

## 2019-01-09 DIAGNOSIS — Z87891 Personal history of nicotine dependence: Secondary | ICD-10-CM | POA: Insufficient documentation

## 2019-01-09 DIAGNOSIS — J45909 Unspecified asthma, uncomplicated: Secondary | ICD-10-CM | POA: Diagnosis not present

## 2019-01-09 DIAGNOSIS — Z794 Long term (current) use of insulin: Secondary | ICD-10-CM | POA: Diagnosis not present

## 2019-01-09 DIAGNOSIS — E119 Type 2 diabetes mellitus without complications: Secondary | ICD-10-CM | POA: Diagnosis not present

## 2019-01-09 DIAGNOSIS — Y93K1 Activity, walking an animal: Secondary | ICD-10-CM | POA: Insufficient documentation

## 2019-01-09 DIAGNOSIS — S42255A Nondisplaced fracture of greater tuberosity of left humerus, initial encounter for closed fracture: Secondary | ICD-10-CM | POA: Diagnosis not present

## 2019-01-09 DIAGNOSIS — S4992XA Unspecified injury of left shoulder and upper arm, initial encounter: Secondary | ICD-10-CM | POA: Diagnosis present

## 2019-01-09 DIAGNOSIS — Y999 Unspecified external cause status: Secondary | ICD-10-CM | POA: Diagnosis not present

## 2019-01-09 DIAGNOSIS — S42212A Unspecified displaced fracture of surgical neck of left humerus, initial encounter for closed fracture: Secondary | ICD-10-CM | POA: Diagnosis not present

## 2019-01-09 DIAGNOSIS — Z79899 Other long term (current) drug therapy: Secondary | ICD-10-CM | POA: Insufficient documentation

## 2019-01-09 DIAGNOSIS — Z7982 Long term (current) use of aspirin: Secondary | ICD-10-CM | POA: Diagnosis not present

## 2019-01-09 DIAGNOSIS — M25522 Pain in left elbow: Secondary | ICD-10-CM | POA: Diagnosis not present

## 2019-01-09 DIAGNOSIS — S6992XA Unspecified injury of left wrist, hand and finger(s), initial encounter: Secondary | ICD-10-CM | POA: Diagnosis not present

## 2019-01-09 DIAGNOSIS — S42215A Unspecified nondisplaced fracture of surgical neck of left humerus, initial encounter for closed fracture: Secondary | ICD-10-CM | POA: Insufficient documentation

## 2019-01-09 DIAGNOSIS — W010XXA Fall on same level from slipping, tripping and stumbling without subsequent striking against object, initial encounter: Secondary | ICD-10-CM | POA: Diagnosis not present

## 2019-01-09 DIAGNOSIS — I10 Essential (primary) hypertension: Secondary | ICD-10-CM | POA: Diagnosis not present

## 2019-01-09 DIAGNOSIS — S59902A Unspecified injury of left elbow, initial encounter: Secondary | ICD-10-CM | POA: Diagnosis not present

## 2019-01-09 DIAGNOSIS — M25532 Pain in left wrist: Secondary | ICD-10-CM | POA: Diagnosis not present

## 2019-01-09 MED ORDER — HYDROMORPHONE HCL 1 MG/ML IJ SOLN
1.0000 mg | Freq: Once | INTRAMUSCULAR | Status: AC
  Administered 2019-01-09: 1 mg via SUBCUTANEOUS
  Filled 2019-01-09: qty 1

## 2019-01-09 MED ORDER — ACETAMINOPHEN 500 MG PO TABS
1000.0000 mg | ORAL_TABLET | Freq: Once | ORAL | Status: AC
  Administered 2019-01-09: 1000 mg via ORAL
  Filled 2019-01-09: qty 2

## 2019-01-09 MED ORDER — OXYCODONE HCL 5 MG PO TABS
5.0000 mg | ORAL_TABLET | Freq: Once | ORAL | Status: AC
  Administered 2019-01-09: 09:00:00 5 mg via ORAL
  Filled 2019-01-09: qty 1

## 2019-01-09 NOTE — ED Notes (Signed)
Patient returned from xray.

## 2019-01-09 NOTE — Discharge Instructions (Signed)
It was my pleasure taking care of you today!  Call the orthopedist today to schedule a follow up appointment.  I would also like you to call your pain management provider and let them know of your injury. You will likely need further pain control over the next few days to weeks.   Return to ER for new or worsening symptoms, any additional concerns.

## 2019-01-09 NOTE — ED Notes (Signed)
Discharge instructions reviewed with patient. Patient verbalizes understanding. VSS. Left shoulder immobilizer in place. Patient verbalizes understanding regarding immobilizer education. Patient reports she will call pain management doctor to alert regarding this injury and for further pain management needs. Patient will call Orthopedist for follow up appointment.

## 2019-01-09 NOTE — ED Triage Notes (Signed)
Pt reports that she was taking her dog out this morning and fell. C/o left shoulder pain down to elbow and some pain in left hand. Denies taking blood thinners.

## 2019-01-09 NOTE — ED Provider Notes (Signed)
[facility] DEPT Provider Note   CSN: [id number] Arrival date & time: 01/09/19  0759    History   Chief Complaint Chief Complaint  Patient presents with  . Fall  . Shoulder Injury  . Arm Pain    HPI Alexa Woods is a 58 y.o. female.     The history is provided by the patient and medical records. No language interpreter was used.  Fall  Shoulder Injury  Arm Pain   Alexa Woods is a 58 y.o. female  with a PMH as listed below who presents to the Emergency Department complaining of left upper extremity pain after fall just prior to arrival.  Patient states that she was walking her dogs and when she fell.  She states that the banisters of her stairs outside or very shaky and did not provide much support, therefore causing her to become unbalanced and fall.  She did not hit her head.  She landed on her left shoulder and elbow.  Denies any numbness.  Pain with any movement of the extremity.  No medications taken prior to arrival for symptoms.  She is left-hand dominant.  Past Medical History:  Diagnosis Date  . Alcohol abuse, in remission    since 1998  . Allergic rhinitis   . Anxiety   . Asthma   . Bilateral lower extremity edema   . Bulging lumbar disc   . Bulging of cervical intervertebral disc   . Chronic constipation   . DDD (degenerative disc disease), lumbosacral   . Depression   . Dyspnea   . GERD (gastroesophageal reflux disease)   . Hemorrhoids   . History of Bell's palsy 08/2007   left  . History of chronic cystitis   . IBS (irritable bowel syndrome)   . Insulin dependent type 2 diabetes mellitus [facility])    endocrinologist-- dr Cruzita Lederer  . Lower urinary tract symptoms (LUTS)   . OA (osteoarthritis)    knees, back, hands, elbows  . OSA (obstructive sleep apnea)    per study 06-08-2017 mild osa , cpap recommended , per pt insurance issue  . Peripheral neuropathy   . PONV (postoperative nausea and vomiting)   . Psoriasis    . S/P dilatation of esophageal stricture   . Unspecified essential hypertension   . Wears partial dentures    lower    Patient Active Problem List   Diagnosis Date Noted  . Puncture wound of foot 08/22/2018  . Chronic constipation 12/26/2017  . History of left knee replacement 12/26/2017  . Primary osteoarthritis of left knee 12/06/2017  . Pre-operative exam 04/05/2017  . Class 3 obesity with serious comorbidity and body mass index (BMI) of 40.0 to 44.9 in adult 01/21/2017  . Dysuria 08/02/2015  . LUQ abdominal pain 07/19/2014  . Joint swelling 12/20/2013  . Leg pain, bilateral 11/28/2013  . Periodontal disease 02/03/2013  . Hyperlipidemia, mild 09/27/2012  . DYSPHAGIA UNSPECIFIED 08/07/2010  . Low back pain 07/22/2010  . Hypokalemia 12/12/2008  . Morbid obesity ([place]) 03/30/2008  . 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  . Type 2 diabetes mellitus with peripheral neuropathy ([place]) 11/30/2006  . Anxiety 11/30/2006  . History of alcohol abuse 11/30/2006  . Depression with anxiety 11/30/2006  . Essential hypertension 11/30/2006  . HEMORRHOIDS 11/30/2006  . ALLERGIC RHINITIS 11/30/2006  . Asthma 11/30/2006  . GERD 11/30/2006  . Psoriasis 11/30/2006    Past Surgical History:  Procedure Laterality Date  . CHOLECYSTECTOMY OPEN  1990   AND APPENDECTOMY  .  DOBUTAMINE STRESS ECHO  2009    normal stress echo, no evidence of ischemia  . ELBOW SURGERY Bilateral RIGHT 2013;  LEFT 2016   for nerve damage  . ESOPHAGOGASTRODUODENOSCOPY  07/2002   erythematous gastropathy  . KNEE ARTHROSCOPY Left 11/ 2018   dr Wynelle Link  @ [facility]  . KNEE ARTHROSCOPY W/ PARTIAL MEDIAL MENISCECTOMY Right 10/2008   and chondroplasty  . SHOULDER ARTHROSCOPY WITH DISTAL CLAVICLE RESECTION Left 12/ 2011   dr dean  . TOTAL KNEE ARTHROPLASTY Right 07-01-2009  dr Wynelle Link   [facility]  . TOTAL KNEE ARTHROPLASTY Left 12/06/2017   Procedure: LEFT TOTAL LEFT KNEE ARTHROPLASTY;  Surgeon: Gaynelle Arabian, MD;  Location: [facility] ORS;   Service: Orthopedics;  Laterality: Left;  Marland Kitchen VAGINAL HYSTERECTOMY  2003     OB History   No obstetric history on file.      Home Medications    Prior to Admission medications   Medication Sig Start Date End Date Taking? Authorizing Provider  acetaminophen (TYLENOL) 325 MG tablet Take 650 mg by mouth every 4 (four) hours as needed for mild pain or fever. for pain/ increased temp. May be administered orally, per G-tube if needed or rectally if unable to swallow (separate order). Maximum dose for 24 hours is 3,000 mg from all sources of Acetaminophen/ Tylenol    [provider]  albuterol (PROVENTIL HFA;VENTOLIN HFA) 108 (90 Base) MCG/ACT inhaler Inhale 2 puffs into the lungs every 6 (six) hours as needed for wheezing or shortness of breath. 03/26/17   Bedsole, Amy E, MD  aspirin 325 MG EC tablet Take 325 mg by mouth daily.    [provider]  beclomethasone (QVAR) 80 MCG/ACT inhaler Inhale 2 puffs 2 (two) times daily into the lungs. Rinse mouth after use. 04/08/17   Laverle Hobby, MD  bisacodyl (DULCOLAX) 10 MG suppository Place 1 suppository (10 mg total) rectally daily as needed for moderate constipation. Patient not taking: Reported on 10/04/2018 12/07/17   Edmisten, Drue Dun L, PA  docusate sodium (COLACE) 100 MG capsule Take 100 mg by mouth every morning.     [provider]  fluticasone (FLONASE) 50 MCG/ACT nasal spray Place 2 sprays into both nostrils daily as needed for allergies or rhinitis.    [provider]  glucose blood (ONE TOUCH ULTRA TEST) test strip Use to test blood sugar 4 times daily as instructed. 01/25/18   [name], MD  hydrochlorothiazide (HYDRODIURIL) 25 MG tablet TAKE 1 TABLET BY MOUTH EVERY DAY 10/31/18   Tower, Wynelle Fanny, MD  hydrocortisone (ANUSOL-HC) 25 MG suppository hydrocortisone acetate 25 mg rectal suppository   25 mg by rectal route. 10/13/17   [provider]  insulin glargine (LANTUS) 100 UNIT/ML injection  Inject 0.4 mLs (40 Units total) into the skin daily. 10/04/18   [name], MD  insulin regular (NOVOLIN R,HUMULIN R) 100 units/mL injection Inject 10-12 Units into the skin 3 (three) times daily before meals.    [provider]  Insulin Syringe-Needle U-100 31G X 5/16" 0.5 ML MISC USE THREE TIMES PER DAY WITH R INSULIN & ONCE DAILY WITH LEVEMIR 06/15/18   [name], MD  KLOR-CON M10 10 MEQ tablet TAKE 1 TABLET BY MOUTH EVERY DAY 08/09/18   Tower, Wynelle Fanny, MD  Lancets [facility] ULTRASOFT) lancets Use to test blood sugar 4 times daily as instructed. 10/04/18   [name], MD  LORazepam (ATIVAN) 1 MG tablet Take 1 mg by mouth every 8 (eight) hours as needed for anxiety  or sleep.     [provider]  methocarbamol (ROBAXIN) 500 MG tablet Take 1 tablet (500 mg total) by mouth every 6 (six) hours as needed for muscle spasms. 12/09/17   Edmisten, Kristie L, PA  ondansetron (ZOFRAN) 4 MG tablet Take 1 tablet (4 mg total) by mouth every 6 (six) hours as needed for nausea. 12/07/17   Edmisten, Ok Anis, PA  oxyCODONE-acetaminophen (PERCOCET) 10-325 MG tablet Take 1 tablet by mouth every 4 (four) hours as needed for pain. Hold for SBP < = 105 12/15/17   Medina-Vargas, Monina C, NP  OXYGEN Inhale 2 L into the lungs as needed.    [provider]  pantoprazole (PROTONIX) 40 MG tablet Take 1 tablet (40 mg total) by mouth 2 (two) times daily. 12/01/16   Irene Shipper, MD  perphenazine (TRILAFON) 2 MG tablet Take 2 mg by mouth at bedtime. 03/08/13   [provider]  polyethylene glycol (MIRALAX / GLYCOLAX) packet Take 17 g by mouth daily as needed.    [provider]  senna (SENOKOT) 8.6 MG TABS tablet Take 2 tablets by mouth 2 (two) times daily.    [provider]  sertraline (ZOLOFT) 100 MG tablet Take 200 mg by mouth every morning.     [provider]  tiZANidine (ZANAFLEX) 4 MG tablet Take 4 mg by mouth 2 (two) times daily as needed.  07/10/14    [provider]  triamcinolone cream (KENALOG) 0.1 % Apply 1 application topically 2 (two) times daily. To psoriasis areas 08/22/18   Tower, Wynelle Fanny, MD  UNABLE TO FIND Diet type : NCS    [provider]  zolpidem (AMBIEN) 10 MG tablet Take 10 mg by mouth at bedtime as needed.     [provider]    Family History Family History  Problem Relation Age of Onset  . Alcohol abuse Mother   . Hypertension Mother   . Esophageal cancer Maternal Grandmother   . Lung cancer Other        mat great uncle  . Colon cancer Neg Hx     Social History Social History   Tobacco Use  . Smoking status: Former Smoker    Years: 25.00    Types: Cigarettes    Quit date: 12/30/2001    Years since quitting: 17.0  . Smokeless tobacco: Never Used  Substance Use Topics  . Alcohol use: No    Alcohol/week: 0.0 standard drinks    Comment: hx alcoholism--  stopped 1998   . Drug use: No     Allergies   Bupropion hcl, Cefuroxime axetil, Darvon [propoxyphene], Gabapentin, Lidocaine, Metformin, Paroxetine, Tramadol hcl, Codeine, and Sulfa antibiotics   Review of Systems Review of Systems  Musculoskeletal: Positive for arthralgias and myalgias.  Skin: Negative for color change and wound.  Neurological: Negative for weakness and numbness.     Physical Exam Updated Vital Signs BP (!) 151/74   Pulse 78   Temp 98.2 F (36.8 C) (Oral)   Resp 15   SpO2 98%   Physical Exam Vitals signs and nursing note reviewed.  Constitutional:      General: She is not in acute distress.    Appearance: She is well-developed.  HENT:     Head: Normocephalic and atraumatic.  Neck:     Musculoskeletal: Neck supple.  Cardiovascular:     Rate and Rhythm: Normal rate and regular rhythm.     Heart sounds: Normal heart sounds. No murmur.  Pulmonary:  Effort: Pulmonary effort is normal. No respiratory distress.     Breath sounds: Normal breath sounds.  Abdominal:     General: There is no  distension.     Palpations: Abdomen is soft.     Tenderness: There is no abdominal tenderness.  Musculoskeletal:     Comments: Tenderness to anterior left shoulder and proximal humerus. Significant amount of pain with any ROM of the shoulder. Mild amount of tenderness to elbow and wrist as well. All compartments are soft. 2+ radial pulse. Sensation intact to median, radial and ulnar nerve distribution.   Skin:    General: Skin is warm and dry.  Neurological:     Mental Status: She is alert and oriented to person, place, and time.      ED Treatments / Results  Labs (all labs ordered are listed, but only abnormal results are displayed) Labs Reviewed - No data to display  EKG None  Radiology Dg Elbow Complete Left  Result Date: 01/09/2019 CLINICAL DATA:  Pain following fall EXAM: LEFT ELBOW - 3 VIEW COMPARISON:  None. FINDINGS: Shallow oblique, more steep oblique, and lateral views obtained. No fracture or dislocation evident. No appreciable joint effusion. The joint spaces appear unremarkable. No erosive change. IMPRESSION: No evident fracture or dislocation. Note that a true frontal view is not submitted. No appreciable joint space narrowing or erosion. Electronically Signed   By: Lowella Grip III M.D.   On: 01/09/2019 09:37   Dg Wrist Complete Left  Result Date: 01/09/2019 CLINICAL DATA:  Pain following fall EXAM: LEFT WRIST - COMPLETE 3+ VIEW COMPARISON:  None. FINDINGS: Frontal, oblique, lateral, and ulnar deviation views obtained. No acute fracture or dislocation is evident. A well corticated focus dorsal to the triquetrum on the lateral view may represent residua of old trauma. There is widening of the space between the lunate and scaphoid bone. There is no appreciable joint space narrowing or erosion. IMPRESSION: 1. No acute fracture or dislocation. Question residua of old trauma dorsal to the triquetrum. 2. 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 Clinical evaluation in this regard advised. 3.  No appreciable joint space narrowing or erosion. Electronically Signed   By: Lowella Grip III M.D.   On: 01/09/2019 09:40   Dg Shoulder Left  Result Date: 01/09/2019 CLINICAL DATA:  Fall with left shoulder injury.  Initial encounter. EXAM: LEFT SHOULDER - 2+ VIEW COMPARISON:  None. FINDINGS: Acute comminuted fracture identified involving the surgical neck of the proximal left humerus as well as the greater tuberosity. There is impaction and displacement at the level of the humeral neck fracture. Nondisplaced fracture planes are seen at the level of the greater tuberosity. No associated dislocation or visible AC joint injury. IMPRESSION: Comminuted fracture involving the surgical neck of the proximal left humerus and extending into the greater tuberosity. There impaction and displacement at the level of the neck fracture. Electronically Signed   By: Aletta Edouard M.D.   On: 01/09/2019 09:37    Procedures Procedures (including critical care time)  Medications Ordered in ED Medications  HYDROmorphone (DILAUDID) injection 1 mg (has no administration in time range)  oxyCODONE (Oxy IR/ROXICODONE) immediate release tablet 5 mg (5 mg Oral Given 01/09/19 0854)  acetaminophen (TYLENOL) tablet 1,000 mg (1,000 mg Oral Given 01/09/19 0854)     Initial Impression / Assessment and Plan / ED Course  I have reviewed the triage vital signs and the nursing notes.  Pertinent labs &  imaging results that were available during my care of the patient were reviewed by me and considered in my medical decision making (see chart for details).       Alexa Woods is a 58 y.o. female who presents to ED for evaluation after mechanical fall. No head injury. NVI with significant amount of pain and tenderness to left shoulder and proximal humerus. X-ray confirming acute comminuted fx to the proximal left humerus.   Discussed films with Dr. Wynelle Link of orthopedics.  Placed in sling and will have her follow-up with orthopedics.  She is under contract for pain management, therefore cannot get new prescription for pain control.  Encouraged her to call her clinic this morning and let them know without acute injury as she will likely need further pain control.  She will call orthopedic office today to arrange follow-up.  Reasons to return to the emergency department were discussed and all questions were answered.  Final Clinical Impressions(s) / ED Diagnoses   Final diagnoses:  Closed nondisplaced fracture of surgical neck of left humerus, unspecified fracture morphology, initial encounter    ED Discharge Orders    None       Ward, Ozella Almond, PA-C 01/09/19 [place], MD 01/10/19 [phone]

## 2019-01-09 NOTE — ED Notes (Signed)
Patient transported to x-ray. ?

## 2019-01-11 ENCOUNTER — Other Ambulatory Visit: Payer: Self-pay | Admitting: Internal Medicine

## 2019-01-16 DIAGNOSIS — M79642 Pain in left hand: Secondary | ICD-10-CM | POA: Diagnosis not present

## 2019-01-16 DIAGNOSIS — M79675 Pain in left toe(s): Secondary | ICD-10-CM | POA: Diagnosis not present

## 2019-01-16 DIAGNOSIS — S42212A Unspecified displaced fracture of surgical neck of left humerus, initial encounter for closed fracture: Secondary | ICD-10-CM | POA: Diagnosis not present

## 2019-01-24 DIAGNOSIS — S42212A Unspecified displaced fracture of surgical neck of left humerus, initial encounter for closed fracture: Secondary | ICD-10-CM | POA: Diagnosis not present

## 2019-01-30 DIAGNOSIS — S42202D Unspecified fracture of upper end of left humerus, subsequent encounter for fracture with routine healing: Secondary | ICD-10-CM | POA: Diagnosis not present

## 2019-02-05 DIAGNOSIS — Z23 Encounter for immunization: Secondary | ICD-10-CM | POA: Diagnosis not present

## 2019-02-11 ENCOUNTER — Other Ambulatory Visit: Payer: Self-pay | Admitting: Family Medicine

## 2019-02-13 DIAGNOSIS — S42202D Unspecified fracture of upper end of left humerus, subsequent encounter for fracture with routine healing: Secondary | ICD-10-CM | POA: Diagnosis not present

## 2019-02-14 NOTE — Telephone Encounter (Signed)
Please set up for a routine f/u or PE when able Refill until then

## 2019-02-14 NOTE — Telephone Encounter (Signed)
Med refilled once and Carrie will reach out to pt to try and get appt scheduled  

## 2019-02-14 NOTE — Telephone Encounter (Signed)
Pt had foot injury on 08/22/18 but no recent or future appts., please advise

## 2019-02-21 ENCOUNTER — Telehealth: Payer: Self-pay | Admitting: Internal Medicine

## 2019-02-21 ENCOUNTER — Ambulatory Visit (INDEPENDENT_AMBULATORY_CARE_PROVIDER_SITE_OTHER): Payer: No Typology Code available for payment source | Admitting: Family Medicine

## 2019-02-21 ENCOUNTER — Other Ambulatory Visit: Payer: Self-pay

## 2019-02-21 ENCOUNTER — Encounter: Payer: Self-pay | Admitting: Family Medicine

## 2019-02-21 VITALS — BP 130/76 | HR 80 | Temp 97.6°F | Ht 67.0 in | Wt 258.0 lb

## 2019-02-21 DIAGNOSIS — J301 Allergic rhinitis due to pollen: Secondary | ICD-10-CM | POA: Diagnosis not present

## 2019-02-21 DIAGNOSIS — S42202S Unspecified fracture of upper end of left humerus, sequela: Secondary | ICD-10-CM

## 2019-02-21 DIAGNOSIS — E1142 Type 2 diabetes mellitus with diabetic polyneuropathy: Secondary | ICD-10-CM

## 2019-02-21 DIAGNOSIS — L304 Erythema intertrigo: Secondary | ICD-10-CM | POA: Insufficient documentation

## 2019-02-21 DIAGNOSIS — J4520 Mild intermittent asthma, uncomplicated: Secondary | ICD-10-CM

## 2019-02-21 DIAGNOSIS — K5903 Drug induced constipation: Secondary | ICD-10-CM | POA: Diagnosis not present

## 2019-02-21 DIAGNOSIS — I10 Essential (primary) hypertension: Secondary | ICD-10-CM

## 2019-02-21 DIAGNOSIS — S42209A Unspecified fracture of upper end of unspecified humerus, initial encounter for closed fracture: Secondary | ICD-10-CM | POA: Insufficient documentation

## 2019-02-21 MED ORDER — FLUTICASONE PROPIONATE 50 MCG/ACT NA SUSP: 2.0000 | Freq: Every day | NASAL | 5 refills | Status: DC | PRN

## 2019-02-21 MED ORDER — ALBUTEROL SULFATE HFA 108 (90 BASE) MCG/ACT IN AERS: 2.0000 | INHALATION_SPRAY | RESPIRATORY_TRACT | 3 refills | Status: DC | PRN

## 2019-02-21 NOTE — Assessment & Plan Note (Signed)
bp in fair control at this time  BP Readings from Last 1 Encounters:  02/21/19 130/76   No changes needed Most recent labs reviewed  Disc lifstyle change with low sodium diet and exercise

## 2019-02-21 NOTE — Telephone Encounter (Signed)
Melissa, can you please ask her about the exact insulin regimen that she is taking now?

## 2019-02-21 NOTE — Assessment & Plan Note (Signed)
From a fall on stairs  L sided  In sling (no surgery) and followed by orthopedics Oxycodone for pain  Disc strategy for constipation

## 2019-02-21 NOTE — Assessment & Plan Note (Signed)
In setting of acute narcotic use On senekot and stool softeners Reminded to keep up fluids  inst to try generic miralax- tid until more regular bm then titrate

## 2019-02-21 NOTE — Assessment & Plan Note (Signed)
Refilled flonase Recommend generic zyrtec otc  Update if no imp  Discussed allergen avoidance as well

## 2019-02-21 NOTE — Telephone Encounter (Signed)
Patient ph# [phone] called re: Patient has been having low blood sugars. Yesterday blood sugar was 66. One 01/19/2019 patient had blood sugar of 56 so patient ate something but her blood sugar went down to 44. On 01/20/19 at 5:30 p.m. blood sugars were 53 Patient's blood sugar were 127 this morning at 9 am. At 12:00 p.m. today after eating, her blood sugars were 253.

## 2019-02-21 NOTE — Assessment & Plan Note (Signed)
Inner thighs/under pannus inst to keep clean and dry frequently  Try otc antifungal like lotrimin or monistat and let us know if no imp  Powder may be helpful in the future

## 2019-02-21 NOTE — Assessment & Plan Note (Signed)
Per pt worse lately  May be pollen related Unsure if Qvar was helpful and cannot afford it right now  Refilled albuterol for prn use

## 2019-02-21 NOTE — Progress Notes (Signed)
Subjective:    Patient ID: Alexa Woods, female    DOB: [birth year], 58 y.o.   MRN: [id number]  HPI  Pt presents for f/u of ED visit on 01/09/19  She presented after a fall on stairs while walking dog ED notes say she lost balance/she was unsure what happened  Landed on L shoulder and elbow (arm over her head)  xrays showed fracture of proximal humerus (with impaction)   Injured her knee and hip on L side also  achey all over now  Sciatic nerve has been acting up    Did not need surgery or re setting   Her blood sugar was not low at the time Has had low glucose levels lately- managed by Dr Cruzita Lederer   [facility] is following  Gradually healing  In a sling  -she is Left handed  Very difficult , and hard to sleep also   Wt Readings from Last 3 Encounters:  02/21/19 258 lb (117 kg)  08/22/18 256 lb 1 oz (116.1 kg)  05/27/18 255 lb (115.7 kg)   40.41 kg/m  Trying to loose weight  Making better food choices   bp is stable today  No cp or palpitations or headaches or edema  No side effects to medicines  BP Readings from Last 3 Encounters:  02/21/19 130/76  01/09/19 (!) 165/79  08/22/18 132/72     F/u ortho will be the 28th  Taking oxycodone for pain - tries to minimize it and tolerates it well  Takes senekot and stool softener daily     Lab Results  Component Value Date   CREATININE 0.98 12/08/2017   BUN 16 12/08/2017   NA 140 12/08/2017   K 3.8 12/08/2017   CL 100 12/08/2017   CO2 29 12/08/2017    Lipids in December Lab Results  Component Value Date   CHOL 191 05/27/2018   HDL 56 05/27/2018   LDLCALC 101 (H) 05/27/2018   LDLDIRECT 126.8 09/23/2012   TRIG 220 (H) 05/27/2018   CHOLHDL 3.4 05/27/2018  Dr Renne Crigler noted she will probably need a statin  Is utd with microalb   Allergies and asthma are bothersome right now  Her Qvar is out of date from pulmonary  Last took allegra  Used to use flonase  Needs a refill on her albuterol inhaler   Rash on  upper thighs -? Yeast  Itchy   Patient Active Problem List   Diagnosis Date Noted  . Proximal humerus fracture 02/21/2019  . Intertrigo 02/21/2019  . Puncture wound of foot 08/22/2018  . Constipation 12/26/2017  . History of left knee replacement 12/26/2017  . Primary osteoarthritis of left knee 12/06/2017  . Pre-operative exam 04/05/2017  . Class 3 obesity with serious comorbidity and body mass index (BMI) of 40.0 to 44.9 in adult 01/21/2017  . Dysuria 08/02/2015  . LUQ abdominal pain 07/19/2014  . Joint swelling 12/20/2013  . Leg pain, bilateral 11/28/2013  . Periodontal disease 02/03/2013  . Hyperlipidemia, mild 09/27/2012  . DYSPHAGIA UNSPECIFIED 08/07/2010  . Low back pain 07/22/2010  . Hypokalemia 12/12/2008  . Morbid obesity ([place]) 03/30/2008  . 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  . Type 2 diabetes mellitus with peripheral neuropathy ([place]) 11/30/2006  . Anxiety 11/30/2006  . History of alcohol abuse 11/30/2006  . Depression with anxiety 11/30/2006  . Essential hypertension 11/30/2006  . HEMORRHOIDS 11/30/2006  . Allergic rhinitis 11/30/2006  . Asthma 11/30/2006  . GERD 11/30/2006  . Psoriasis 11/30/2006   Past Medical  History:  Diagnosis Date  . Alcohol abuse, in remission    since 1998  . Allergic rhinitis   . Anxiety   . Asthma   . Bilateral lower extremity edema   . Bulging lumbar disc   . Bulging of cervical intervertebral disc   . Chronic constipation   . DDD (degenerative disc disease), lumbosacral   . Depression   . Dyspnea   . GERD (gastroesophageal reflux disease)   . Hemorrhoids   . History of Bell's palsy 08/2007   left  . History of chronic cystitis   . IBS (irritable bowel syndrome)   . Insulin dependent type 2 diabetes mellitus [facility])    endocrinologist-- dr Cruzita Lederer  . Lower urinary tract symptoms (LUTS)   . OA (osteoarthritis)    knees, back, hands, elbows  . OSA (obstructive sleep apnea)    per study 06-08-2017 mild osa , cpap  recommended , per pt insurance issue  . Peripheral neuropathy   . PONV (postoperative nausea and vomiting)   . Psoriasis   . S/P dilatation of esophageal stricture   . Unspecified essential hypertension   . Wears partial dentures    lower   Past Surgical History:  Procedure Laterality Date  . CHOLECYSTECTOMY OPEN  1990   AND APPENDECTOMY  . DOBUTAMINE STRESS ECHO  2009    normal stress echo, no evidence of ischemia  . ELBOW SURGERY Bilateral RIGHT 2013;  LEFT 2016   for nerve damage  . ESOPHAGOGASTRODUODENOSCOPY  07/2002   erythematous gastropathy  . KNEE ARTHROSCOPY Left 11/ 2018   dr Wynelle Link  @ [facility]  . KNEE ARTHROSCOPY W/ PARTIAL MEDIAL MENISCECTOMY Right 10/2008   and chondroplasty  . SHOULDER ARTHROSCOPY WITH DISTAL CLAVICLE RESECTION Left 12/ 2011   dr dean  . TOTAL KNEE ARTHROPLASTY Right 07-01-2009  dr Wynelle Link   [facility]  . TOTAL KNEE ARTHROPLASTY Left 12/06/2017   Procedure: LEFT TOTAL LEFT KNEE ARTHROPLASTY;  Surgeon: Gaynelle Arabian, MD;  Location: [facility];  Service: Orthopedics;  Laterality: Left;  Marland Kitchen VAGINAL HYSTERECTOMY  2003   Social History   Tobacco Use  . Smoking status: Former Smoker    Years: 25.00    Types: Cigarettes    Quit date: 12/30/2001    Years since quitting: 17.1  . Smokeless tobacco: Never Used  Substance Use Topics  . Alcohol use: No    Alcohol/week: 0.0 standard drinks    Comment: hx alcoholism--  stopped 1998   . Drug use: No   Family History  Problem Relation Age of Onset  . Alcohol abuse Mother   . Hypertension Mother   . Esophageal cancer Maternal Grandmother   . Lung cancer Other        mat great uncle  . Colon cancer Neg Hx    Allergies  Allergen Reactions  . Bupropion Hcl Other (See Comments)    Pt is unsure  . Cefuroxime Axetil Nausea Only  . Darvon [Propoxyphene]     wheezing  . Gabapentin     Pt does not remember reaction   . Lidocaine     REACTION: unknown  . Metformin     REACTION: GI  . Paroxetine     REACTION: doesn't  agree  . Tramadol Hcl     REACTION: Causes Anxiety  . Codeine Nausea And Vomiting and Rash  . Sulfa Antibiotics Rash   Current Outpatient Medications on File Prior to Visit  Medication Sig Dispense Refill  . acetaminophen (TYLENOL) 325 MG tablet  Take 650 mg by mouth every 4 (four) hours as needed for mild pain or fever. for pain/ increased temp. May be administered orally, per G-tube if needed or rectally if unable to swallow (separate order). Maximum dose for 24 hours is 3,000 mg from all sources of Acetaminophen/ Tylenol    . albuterol (PROVENTIL HFA;VENTOLIN HFA) 108 (90 Base) MCG/ACT inhaler Inhale 2 puffs into the lungs every 6 (six) hours as needed for wheezing or shortness of breath. 18 g 1  . aspirin 325 MG EC tablet Take 325 mg by mouth daily.    . beclomethasone (QVAR) 80 MCG/ACT inhaler Inhale 2 puffs 2 (two) times daily into the lungs. Rinse mouth after use. 1 Inhaler 5  . bisacodyl (DULCOLAX) 10 MG suppository Place 1 suppository (10 mg total) rectally daily as needed for moderate constipation. 12 suppository 0  . docusate sodium (COLACE) 100 MG capsule Take 100 mg by mouth every morning.     Marland Kitchen glucose blood (ONE TOUCH ULTRA TEST) test strip Use to test blood sugar 4 times daily as instructed. 300 each 3  . HUMULIN R 100 UNIT/ML injection INJECT 0.14-0.24ML'S (14 TO 24 UNITS TOTAL) INTO THE SKIN THREE TIMES A DAY BEFORE MEALS. 20 mL 9  . hydrochlorothiazide (HYDRODIURIL) 25 MG tablet TAKE 1 TABLET BY MOUTH EVERY DAY 90 tablet 1  . hydrocortisone (ANUSOL-HC) 25 MG suppository hydrocortisone acetate 25 mg rectal suppository   25 mg by rectal route.    . insulin glargine (LANTUS) 100 UNIT/ML injection Inject 0.4 mLs (40 Units total) into the skin daily. 30 mL 3  . Insulin Syringe-Needle U-100 31G X 5/16" 0.5 ML MISC USE THREE TIMES PER DAY WITH R INSULIN & ONCE DAILY WITH LEVEMIR 100 each 11  . KLOR-CON M10 10 MEQ tablet TAKE 1 TABLET BY MOUTH EVERY DAY 90 tablet 0  . Lancets  (ONETOUCH ULTRASOFT) lancets Use to test blood sugar 4 times daily as instructed. 200 each 11  . LORazepam (ATIVAN) 1 MG tablet Take 1 mg by mouth every 8 (eight) hours as needed for anxiety or sleep.     . methocarbamol (ROBAXIN) 500 MG tablet Take 1 tablet (500 mg total) by mouth every 6 (six) hours as needed for muscle spasms. 40 tablet 0  . ondansetron (ZOFRAN) 4 MG tablet Take 1 tablet (4 mg total) by mouth every 6 (six) hours as needed for nausea. 20 tablet 0  . oxyCODONE-acetaminophen (PERCOCET) 10-325 MG tablet Take 1 tablet by mouth every 4 (four) hours as needed for pain. Hold for SBP < = 105 12 tablet 0  . OXYGEN Inhale 2 L into the lungs as needed.    . pantoprazole (PROTONIX) 40 MG tablet Take 1 tablet (40 mg total) by mouth 2 (two) times daily. 180 tablet 3  . polyethylene glycol (MIRALAX / GLYCOLAX) packet Take 17 g by mouth daily as needed.    . senna (SENOKOT) 8.6 MG TABS tablet Take 2 tablets by mouth 2 (two) times daily.    . sertraline (ZOLOFT) 100 MG tablet Take 200 mg by mouth every morning.     Marland Kitchen tiZANidine (ZANAFLEX) 4 MG tablet Take 4 mg by mouth 2 (two) times daily as needed.     . triamcinolone cream (KENALOG) 0.1 % Apply 1 application topically 2 (two) times daily. To psoriasis areas 30 g 0  . UNABLE TO FIND Diet type : NCS    . zolpidem (AMBIEN) 10 MG tablet Take 10 mg by mouth at bedtime  as needed.      No current facility-administered medications on file prior to visit.      Review of Systems  Constitutional: Positive for fatigue. Negative for activity change, appetite change, fever and unexpected weight change.  HENT: Negative for congestion, ear pain, rhinorrhea, sinus pressure and sore throat.   Eyes: Positive for pain and visual disturbance. Negative for redness.  Respiratory: Positive for wheezing. Negative for cough and shortness of breath.   Cardiovascular: Negative for chest pain, palpitations and leg swelling.       Some L rib pain since fall   Gastrointestinal: Positive for constipation. Negative for abdominal pain, blood in stool and diarrhea.  Endocrine: Negative for polydipsia and polyuria.  Genitourinary: Negative for dysuria, frequency and urgency.  Musculoskeletal: Positive for arthralgias. Negative for back pain and myalgias.  Skin: Positive for rash. Negative for pallor.  Allergic/Immunologic: Positive for environmental allergies.  Neurological: Negative for dizziness, syncope and headaches.  Hematological: Negative for adenopathy. Does not bruise/bleed easily.  Psychiatric/Behavioral: Negative for decreased concentration and dysphoric mood. The patient is not nervous/anxious.        Many stressors        Objective:   Physical Exam Constitutional:      General: She is not in acute distress.    Appearance: Normal appearance. She is well-developed. She is obese. She is not ill-appearing.  HENT:     Head: Normocephalic and atraumatic.     Nose: Rhinorrhea present.     Mouth/Throat:     Mouth: Mucous membranes are moist.     Pharynx: Oropharynx is clear. No posterior oropharyngeal erythema.  Eyes:     General: No scleral icterus.       Right eye: No discharge.        Left eye: No discharge.     Conjunctiva/sclera: Conjunctivae normal.     Pupils: Pupils are equal, round, and reactive to light.  Neck:     Musculoskeletal: Normal range of motion and neck supple. No neck rigidity or muscular tenderness.     Thyroid: No thyromegaly.     Vascular: No carotid bruit or JVD.  Cardiovascular:     Rate and Rhythm: Normal rate and regular rhythm.     Heart sounds: Normal heart sounds. No gallop.   Pulmonary:     Effort: Pulmonary effort is normal. No respiratory distress.     Breath sounds: Normal breath sounds. No wheezing or rales.     Comments: Good air exch  No wheezing or prolonged exp phase Abdominal:     General: Bowel sounds are normal. There is no distension or abdominal bruit.     Palpations: Abdomen is  soft. There is no mass.     Tenderness: There is no abdominal tenderness.     Hernia: No hernia is present.  Musculoskeletal:     Right lower leg: No edema.     Left lower leg: No edema.     Comments: L arm in a sling with very limited rom  Lymphadenopathy:     Cervical: No cervical adenopathy.  Skin:    General: Skin is warm and dry.     Findings: No rash.  Neurological:     Mental Status: She is alert.     Sensory: No sensory deficit.     Coordination: Coordination normal.     Deep Tendon Reflexes: Reflexes are normal and symmetric.  Psychiatric:        Mood and Affect: Mood normal.  Assessment & Plan:   Problem List Items Addressed This Visit      Cardiovascular and Mediastinum   Essential hypertension    bp in fair control at this time  BP Readings from Last 1 Encounters:  02/21/19 130/76   No changes needed Most recent labs reviewed  Disc lifstyle change with low sodium diet and exercise          Respiratory   Allergic rhinitis    Refilled flonase Recommend generic zyrtec otc  Update if no imp  Discussed allergen avoidance as well       Asthma    Per pt worse lately  May be pollen related Unsure if Qvar was helpful and cannot afford it right now  Refilled albuterol for prn use         Relevant Medications   albuterol (VENTOLIN HFA) 108 (90 Base) MCG/ACT inhaler     Endocrine   Type 2 diabetes mellitus with peripheral neuropathy (HCC)    Pt is having problems with low glucose readings and is waiting for call response from endocrinology  Is eating better and trying to loose wt Much overdue for DM eye exam -ref done  She does notice change in vision       Relevant Orders   Ambulatory referral to Ophthalmology     Musculoskeletal and Integument   Proximal humerus fracture - Primary    From a fall on stairs  L sided  In sling (no surgery) and followed by orthopedics Oxycodone for pain  Disc strategy for constipation         Intertrigo    Inner thighs/under pannus inst to keep clean and dry frequently  Try otc antifungal like lotrimin or monistat and let us know if no imp  Powder may be helpful in the future        Other   Morbid obesity ([place])    Pt has recently changed diet in effort to loose Recent fx arm has prevented exercise       Constipation    In setting of acute narcotic use On senekot and stool softeners Reminded to keep up fluids  inst to try generic miralax- tid until more regular bm then titrate

## 2019-02-21 NOTE — Assessment & Plan Note (Signed)
Pt has recently changed diet in effort to loose Recent fx arm has prevented exercise

## 2019-02-21 NOTE — Assessment & Plan Note (Signed)
Pt is having problems with low glucose readings and is waiting for call response from endocrinology  Is eating better and trying to loose wt Much overdue for DM eye exam -ref done  She does notice change in vision

## 2019-02-21 NOTE — Patient Instructions (Addendum)
Get generic /store brand zyrtec over the counter for allergies  I refilled albuterol and flonase   Try a yeast treatment like lotrimin or monistat on your rash in the groin area  Keep it clean and dry (as dry as possible)   Take care of yourself   I placed a referral for a diabetic eye exam  Our office will call you to schedule that in the next 1-2 weeks

## 2019-02-22 NOTE — Telephone Encounter (Signed)
Spoke to patient regarding insulin:  Before meals she is taking 15 units B,L,D  At night she is taking the 40 units.  Glucose this morning before breakfast 107, after breakfast 256.

## 2019-02-23 NOTE — Telephone Encounter (Signed)
Notified patient of message from Dr. Gherghe, patient expressed understanding and agreement. No further questions.  

## 2019-02-23 NOTE — Telephone Encounter (Signed)
I would suggest to decrease the dose of long-acting insulin from 40 units to 32 units.  Please let us know how this is going.

## 2019-02-27 ENCOUNTER — Encounter: Payer: No Typology Code available for payment source | Admitting: Family Medicine

## 2019-02-27 DIAGNOSIS — Z96652 Presence of left artificial knee joint: Secondary | ICD-10-CM | POA: Diagnosis not present

## 2019-02-27 DIAGNOSIS — S42202D Unspecified fracture of upper end of left humerus, subsequent encounter for fracture with routine healing: Secondary | ICD-10-CM | POA: Diagnosis not present

## 2019-02-27 DIAGNOSIS — Z471 Aftercare following joint replacement surgery: Secondary | ICD-10-CM | POA: Diagnosis not present

## 2019-03-06 DIAGNOSIS — S42202D Unspecified fracture of upper end of left humerus, subsequent encounter for fracture with routine healing: Secondary | ICD-10-CM | POA: Diagnosis not present

## 2019-03-06 DIAGNOSIS — M25512 Pain in left shoulder: Secondary | ICD-10-CM | POA: Diagnosis not present

## 2019-03-24 ENCOUNTER — Other Ambulatory Visit: Payer: Self-pay

## 2019-03-24 ENCOUNTER — Ambulatory Visit (INDEPENDENT_AMBULATORY_CARE_PROVIDER_SITE_OTHER): Payer: No Typology Code available for payment source | Admitting: Internal Medicine

## 2019-03-24 ENCOUNTER — Encounter: Payer: Self-pay | Admitting: Internal Medicine

## 2019-03-24 VITALS — BP 140/80 | HR 75 | Ht 67.0 in | Wt 258.0 lb

## 2019-03-24 DIAGNOSIS — E559 Vitamin D deficiency, unspecified: Secondary | ICD-10-CM | POA: Diagnosis not present

## 2019-03-24 DIAGNOSIS — E785 Hyperlipidemia, unspecified: Secondary | ICD-10-CM | POA: Diagnosis not present

## 2019-03-24 DIAGNOSIS — E1142 Type 2 diabetes mellitus with diabetic polyneuropathy: Secondary | ICD-10-CM | POA: Diagnosis not present

## 2019-03-24 LAB — POCT GLYCOSYLATED HEMOGLOBIN (HGB A1C): Hemoglobin A1C: 6.8 % — AB (ref 4.0–5.6)
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
## 2019-03-24 NOTE — Patient Instructions (Addendum)
Please change: - Lantus 32 units at bedtime - Humulin R insulin:  10-15 units before b'fast  10-15 units before lunch 10-15 units before dinner If you plan to be more active after a meal, please reduce the insulin before that meal by 5 units.  Please come back for a follow-up appointment in 4 months.

## 2019-03-25 LAB — LIPID PANEL
Cholesterol: 181 mg/dL (ref <200)
HDL: 59 mg/dL (ref >=50)
LDL Cholesterol (Calc): 91 mg/dL (calc)
Non-HDL Cholesterol (Calc): 122 mg/dL (calc) (ref <130)
Total CHOL/HDL Ratio: 3.1 (calc) (ref <5.0)
Triglycerides: 225 mg/dL — ABNORMAL HIGH (ref <150)

## 2019-03-25 LAB — COMPLETE METABOLIC PANEL WITH GFR
AG Ratio: 1.3 (calc) (ref 1.0–2.5)
ALT: 21 U/L (ref 6–29)
AST: 26 U/L (ref 10–35)
Albumin: 4 g/dL (ref 3.6–5.1)
Alkaline phosphatase (APISO): 173 U/L — ABNORMAL HIGH (ref 37–153)
BUN: 16 mg/dL (ref 7–25)
CO2: 28 mmol/L (ref 20–32)
Calcium: 9.1 mg/dL (ref 8.6–10.4)
Chloride: 104 mmol/L (ref 98–110)
Creat: 1.02 mg/dL (ref 0.50–1.05)
GFR, Est African American: 71 mL/min/{1.73_m2} (ref >=60)
GFR, Est Non African American: 61 mL/min/{1.73_m2} (ref >=60)
Globulin: 3.1 g/dL (calc) (ref 1.9–3.7)
Glucose, Bld: 76 mg/dL (ref 65–99)
Potassium: 3.7 mmol/L (ref 3.5–5.3)
Sodium: 140 mmol/L (ref 135–146)
Total Bilirubin: 0.3 mg/dL (ref 0.2–1.2)
Total Protein: 7.1 g/dL (ref 6.1–8.1)

## 2019-03-25 LAB — VITAMIN D 25 HYDROXY (VIT D DEFICIENCY, FRACTURES): Vit D, 25-Hydroxy: 11 ng/mL — ABNORMAL LOW (ref 30–100)

## 2019-03-28 ENCOUNTER — Telehealth: Payer: Self-pay

## 2019-03-28 NOTE — Telephone Encounter (Signed)
-----   Message from [name], MD sent at 03/28/2019 12:05 PM EDT ----- Lenna Sciara, can you please call pt:  Her comprehensive metabolic panel is normal with the exception of a high alkaline phosphatase, as expected after her fracture, and especially since her vitamin D is very low (11)!  This is possibly the reason why her fracture is not healing well.  Please ask her to start vitamin D 5000 units daily and will need to have her back for recheck in 2 months.  Lab is in. LDL is at goal, lower than 100.  Triglycerides high, but this was not a fasting sample.

## 2019-03-28 NOTE — Telephone Encounter (Signed)
Notified patient of message from Dr. Gherghe, patient expressed understanding and agreement. No further questions.  

## 2019-04-03 ENCOUNTER — Ambulatory Visit (INDEPENDENT_AMBULATORY_CARE_PROVIDER_SITE_OTHER): Payer: Medicare Other | Admitting: Family Medicine

## 2019-04-03 ENCOUNTER — Encounter: Payer: Self-pay | Admitting: Family Medicine

## 2019-04-03 ENCOUNTER — Other Ambulatory Visit: Payer: Self-pay | Admitting: Family Medicine

## 2019-04-03 ENCOUNTER — Other Ambulatory Visit: Payer: Self-pay

## 2019-04-03 ENCOUNTER — Other Ambulatory Visit (HOSPITAL_COMMUNITY)
Admission: RE | Admit: 2019-04-03 | Discharge: 2019-04-03 | Disposition: A | Discharge status: Home / Self Care | Payer: PRIVATE HEALTH INSURANCE | Source: Ambulatory Visit | Attending: Family Medicine | Admitting: Family Medicine

## 2019-04-03 VITALS — BP 126/74 | HR 73 | Temp 97.0°F | Ht 65.25 in | Wt 253.2 lb

## 2019-04-03 DIAGNOSIS — E785 Hyperlipidemia, unspecified: Secondary | ICD-10-CM

## 2019-04-03 DIAGNOSIS — F418 Other specified anxiety disorders: Secondary | ICD-10-CM

## 2019-04-03 DIAGNOSIS — K219 Gastro-esophageal reflux disease without esophagitis: Secondary | ICD-10-CM

## 2019-04-03 DIAGNOSIS — Z0000 Encounter for general adult medical examination without abnormal findings: Secondary | ICD-10-CM

## 2019-04-03 DIAGNOSIS — Z01419 Encounter for gynecological examination (general) (routine) without abnormal findings: Secondary | ICD-10-CM | POA: Diagnosis not present

## 2019-04-03 DIAGNOSIS — Z1159 Encounter for screening for other viral diseases: Secondary | ICD-10-CM

## 2019-04-03 DIAGNOSIS — E1169 Type 2 diabetes mellitus with other specified complication: Secondary | ICD-10-CM

## 2019-04-03 DIAGNOSIS — Z114 Encounter for screening for human immunodeficiency virus [HIV]: Secondary | ICD-10-CM | POA: Diagnosis not present

## 2019-04-03 DIAGNOSIS — Z1231 Encounter for screening mammogram for malignant neoplasm of breast: Secondary | ICD-10-CM | POA: Diagnosis not present

## 2019-04-03 DIAGNOSIS — E1142 Type 2 diabetes mellitus with diabetic polyneuropathy: Secondary | ICD-10-CM | POA: Diagnosis not present

## 2019-04-03 DIAGNOSIS — I10 Essential (primary) hypertension: Secondary | ICD-10-CM | POA: Diagnosis not present

## 2019-04-03 DIAGNOSIS — S42202S Unspecified fracture of upper end of left humerus, sequela: Secondary | ICD-10-CM | POA: Diagnosis not present

## 2019-04-03 DIAGNOSIS — E559 Vitamin D deficiency, unspecified: Secondary | ICD-10-CM

## 2019-04-03 DIAGNOSIS — L409 Psoriasis, unspecified: Secondary | ICD-10-CM

## 2019-04-03 DIAGNOSIS — F419 Anxiety disorder, unspecified: Secondary | ICD-10-CM

## 2019-04-03 MED ORDER — PANTOPRAZOLE SODIUM 40 MG PO TBEC: 40.0000 mg | DELAYED_RELEASE_TABLET | Freq: Two times a day (BID) | ORAL | 3 refills | Status: DC

## 2019-04-03 NOTE — Assessment & Plan Note (Signed)
Scheduled annual screening mammogram Nl breast exam today  Encouraged monthly self exams   

## 2019-04-03 NOTE — Assessment & Plan Note (Signed)
Noted on L heel today -plaque

## 2019-04-03 NOTE — Assessment & Plan Note (Signed)
Improved Seeing endocrinology Diet is better Neuropathy is bothersome  Not on ace or arb Not on statin-need to discuss further

## 2019-04-03 NOTE — Assessment & Plan Note (Signed)
Continues sertraline for this and depression  Reviewed stressors/ coping techniques/symptoms/ support sources/ tx options and side effects in detail today No change in stressors Enc good self care   Difficult to exercise with recent injuries

## 2019-04-03 NOTE — Assessment & Plan Note (Signed)
Sees psychiatry Continues sertraline  Reviewed stressors/ coping techniques/symptoms/ support sources/ tx options and side effects in detail today Stressors persist Enc self care

## 2019-04-03 NOTE — Assessment & Plan Note (Signed)
Hep C screen with labs today

## 2019-04-03 NOTE — Progress Notes (Signed)
Subjective:    Patient ID: Alexa Woods, female    DOB: 06/24/1960, 58 y.o.   MRN: [id number]  HPI  Here for health maintenance exam and to review chronic medical problems     Colon cancer screening  Colonoscopy 4/12  Breast cancer screening mammogram 1/10  Self breast exam- no lumps /has occ breast pain all her life Pap 8/02 -has since had a hysterectomy  She has a discharge now -no blood Not sexually active  Flu vaccine 9/20 Tetanus vaccine 3/20 Tdap Pneumovax 3/17  Zoster vaccine-may be interested  Dexa- has not had / has had a recent fracture  Falls=one this year with arm fracture  Fractures-recent humerus fracture  Also 2 toes on her L foot-did not feel due to neuropathy  Supplements-now taking 5000 iu daily Vit D level low at 11  Exercise - walking (before she broke her arm)- needs to get back to it  Arm is painful- hard to exercise (has shoulder rot cuff that is torn also-small)  STD screening- HIV/hep C=   PMH and SH reviewed  Meds, vitals, and allergies reviewed.   ROS: See HPI.  Otherwise negative.    Weight : Wt Readings from Last 3 Encounters:  04/03/19 253 lb 4 oz (114.9 kg)  03/24/19 258 lb (117 kg)  02/21/19 258 lb (117 kg)   41.82 kg/m  She is eating better-trying to stick to a schedule  A little out of whack due to her arm/ cannot get comfortable to sleep   Hypertension  Takes hctz 25 mg daily  bp is stable today  No cp or palpitations or headaches or edema  No side effects to medicines  BP Readings from Last 3 Encounters:  04/03/19 126/74  03/24/19 140/80  02/21/19 130/76     Lab Results  Component Value Date   CREATININE 1.02 03/24/2019   BUN 16 03/24/2019   NA 140 03/24/2019   K 3.7 03/24/2019   CL 104 03/24/2019   CO2 28 03/24/2019   Lab Results  Component Value Date   ALT 21 03/24/2019   AST 26 03/24/2019   ALKPHOS 152 (H) 11/30/2017   BILITOT 0.3 03/24/2019   alk phos is high due to recent fx   Takes generic  protonix for gerd  Out of it- has symptoms   DM2 Sees endocrinology  Lab Results  Component Value Date   MICROALBUR 1.6 05/27/2018    Lab Results  Component Value Date   HGBA1C 6.8 (A) 03/24/2019   Better control with insulin   Eye exam has been ordered Has some neuropathy symptoms   Still takes sertranline for anx/dep Husband is alcoholic  Stress of chronic pain- arm  Son is 63- does not treat her well    Hyperlipidemia Due for labs Not on statin  Lab Results  Component Value Date   CHOL 181 03/24/2019   HDL 59 03/24/2019   LDLCALC 91 03/24/2019   LDLDIRECT 126.8 09/23/2012   TRIG 225 (H) 03/24/2019   CHOLHDL 3.1 03/24/2019   She should be on a statin in light of her DM  LDL is not bad but not at goal Diet is improved  Patient Active Problem List   Diagnosis Date Noted   Screening mammogram, encounter for 04/03/2019   Routine general medical examination at a health care facility 04/03/2019   Encounter for screening for HIV 04/03/2019   Encounter for hepatitis C screening test for low risk patient 04/03/2019   Visit for routine  gyn exam 04/03/2019   Proximal humerus fracture 02/21/2019   Intertrigo 02/21/2019   Constipation 12/26/2017   History of left knee replacement 12/26/2017   Primary osteoarthritis of left knee 12/06/2017   Pre-operative exam 04/05/2017   Class 3 obesity with serious comorbidity and body mass index (BMI) of 40.0 to 44.9 in adult 01/21/2017   Dysuria 08/02/2015   LUQ abdominal pain 07/19/2014   Joint swelling 12/20/2013   Leg pain, bilateral 11/28/2013   Periodontal disease 02/03/2013   Hyperlipidemia associated with type 2 diabetes mellitus ([place]) 09/27/2012   DYSPHAGIA UNSPECIFIED 08/07/2010   Low back pain 07/22/2010   Hypokalemia 12/12/2008   Morbid obesity ([place]) 03/30/2008   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   Type 2 diabetes mellitus with peripheral neuropathy ([place]) 11/30/2006   Anxiety 11/30/2006     History of alcohol abuse 11/30/2006   Depression with anxiety 11/30/2006   Essential hypertension 11/30/2006   HEMORRHOIDS 11/30/2006   Allergic rhinitis 11/30/2006   Asthma 11/30/2006   GERD 11/30/2006   Psoriasis 11/30/2006   Past Medical History:  Diagnosis Date   Alcohol abuse, in remission    since 1998   Allergic rhinitis    Anxiety    Asthma    Bilateral lower extremity edema    Bulging lumbar disc    Bulging of cervical intervertebral disc    Chronic constipation    DDD (degenerative disc disease), lumbosacral    Depression    Dyspnea    GERD (gastroesophageal reflux disease)    Hemorrhoids    History of Bell's palsy 08/2007   left   History of chronic cystitis    IBS (irritable bowel syndrome)    Insulin dependent type 2 diabetes mellitus [facility])    endocrinologist-- dr Cruzita Lederer   Lower urinary tract symptoms (LUTS)    OA (osteoarthritis)    knees, back, hands, elbows   OSA (obstructive sleep apnea)    per study 06-08-2017 mild osa , cpap recommended , per pt insurance issue   Peripheral neuropathy    PONV (postoperative nausea and vomiting)    Psoriasis    S/P dilatation of esophageal stricture    Unspecified essential hypertension    Wears partial dentures    lower   Past Surgical History:  Procedure Laterality Date   CHOLECYSTECTOMY OPEN  1990   AND APPENDECTOMY   DOBUTAMINE STRESS ECHO  2009    normal stress echo, no evidence of ischemia   ELBOW SURGERY Bilateral RIGHT 2013;  LEFT 2016   for nerve damage   ESOPHAGOGASTRODUODENOSCOPY  07/2002   erythematous gastropathy   KNEE ARTHROSCOPY Left 11/ 2018   dr Wynelle Link  @ [facility]   KNEE ARTHROSCOPY W/ PARTIAL MEDIAL MENISCECTOMY Right 10/2008   and chondroplasty   SHOULDER ARTHROSCOPY WITH DISTAL CLAVICLE RESECTION Left 12/ 2011   dr dean   TOTAL KNEE ARTHROPLASTY Right 07-01-2009  dr Wynelle Link   [facility]   TOTAL KNEE ARTHROPLASTY Left 12/06/2017   Procedure: LEFT TOTAL  LEFT KNEE ARTHROPLASTY;  Surgeon: Gaynelle Arabian, MD;  Location: [facility];  Service: Orthopedics;  Laterality: Left;   VAGINAL HYSTERECTOMY  2003   Social History   Tobacco Use   Smoking status: Former Smoker    Years: 25.00    Types: Cigarettes    Quit date: 12/30/2001    Years since quitting: 17.2   Smokeless tobacco: Never Used  Substance Use Topics   Alcohol use: No    Alcohol/week: 0.0 standard drinks    Comment: hx alcoholism--  stopped 1998    Drug use: No   Family History  Problem Relation Age of Onset   Alcohol abuse Mother    Hypertension Mother    Esophageal cancer Maternal Grandmother    Lung cancer Other        mat great uncle   Colon cancer Neg Hx    Allergies  Allergen Reactions   Bupropion Hcl Other (See Comments)    Pt is unsure   Cefuroxime Axetil Nausea Only   Darvon [Propoxyphene]     wheezing   Gabapentin     Pt does not remember reaction    Lidocaine     REACTION: unknown   Metformin     REACTION: GI   Paroxetine     REACTION: doesn't agree   Tramadol Hcl     REACTION: Causes Anxiety   Codeine Nausea And Vomiting and Rash   Sulfa Antibiotics Rash   Current Outpatient Medications on File Prior to Visit  Medication Sig Dispense Refill   acetaminophen (TYLENOL) 325 MG tablet Take 650 mg by mouth every 4 (four) hours as needed for mild pain or fever. for pain/ increased temp. May be administered orally, per G-tube if needed or rectally if unable to swallow (separate order). Maximum dose for 24 hours is 3,000 mg from all sources of Acetaminophen/ Tylenol     albuterol (PROVENTIL HFA;VENTOLIN HFA) 108 (90 Base) MCG/ACT inhaler Inhale 2 puffs into the lungs every 6 (six) hours as needed for wheezing or shortness of breath. 18 g 1   albuterol (VENTOLIN HFA) 108 (90 Base) MCG/ACT inhaler Inhale 2 puffs into the lungs every 4 (four) hours as needed for wheezing or shortness of breath. 18 g 3   docusate sodium (COLACE) 100 MG capsule  Take 100 mg by mouth every morning.      fluticasone (FLONASE) 50 MCG/ACT nasal spray Place 2 sprays into both nostrils daily as needed for allergies or rhinitis. 16 g 5   glucose blood (ONE TOUCH ULTRA TEST) test strip Use to test blood sugar 4 times daily as instructed. 300 each 3   HUMULIN R 100 UNIT/ML injection INJECT 0.14-0.24ML'S (14 TO 24 UNITS TOTAL) INTO THE SKIN THREE TIMES A DAY BEFORE MEALS. 20 mL 9   hydrochlorothiazide (HYDRODIURIL) 25 MG tablet TAKE 1 TABLET BY MOUTH EVERY DAY 90 tablet 1   insulin glargine (LANTUS) 100 UNIT/ML injection Inject 0.4 mLs (40 Units total) into the skin daily. 30 mL 3   Insulin Syringe-Needle U-100 31G X 5/16" 0.5 ML MISC USE THREE TIMES PER DAY WITH R INSULIN & ONCE DAILY WITH LEVEMIR 100 each 11   KLOR-CON M10 10 MEQ tablet TAKE 1 TABLET BY MOUTH EVERY DAY 90 tablet 0   Lancets (ONETOUCH ULTRASOFT) lancets Use to test blood sugar 4 times daily as instructed. 200 each 11   LORazepam (ATIVAN) 1 MG tablet Take 1 mg by mouth every 8 (eight) hours as needed for anxiety or sleep.      oxyCODONE-acetaminophen (PERCOCET) 10-325 MG tablet Take 1 tablet by mouth every 4 (four) hours as needed for pain. Hold for SBP < = 105 12 tablet 0   polyethylene glycol (MIRALAX / GLYCOLAX) packet Take 17 g by mouth daily as needed.     senna (SENOKOT) 8.6 MG TABS tablet Take 2 tablets by mouth 2 (two) times daily.     sertraline (ZOLOFT) 100 MG tablet Take 200 mg by mouth every morning.      tiZANidine (ZANAFLEX) 4  MG tablet Take 4 mg by mouth 2 (two) times daily as needed.      triamcinolone cream (KENALOG) 0.1 % Apply 1 application topically 2 (two) times daily. To psoriasis areas 30 g 0   UNABLE TO FIND Diet type : NCS     zolpidem (AMBIEN) 10 MG tablet Take 10 mg by mouth at bedtime as needed.      aspirin 325 MG EC tablet Take 325 mg by mouth daily.     beclomethasone (QVAR) 80 MCG/ACT inhaler Inhale 2 puffs 2 (two) times daily into the lungs. Rinse  mouth after use. (Patient not taking: Reported on 04/03/2019) 1 Inhaler 5   No current facility-administered medications on file prior to visit.      Review of Systems  Constitutional: Positive for fatigue. Negative for activity change, appetite change, fever and unexpected weight change.  HENT: Negative for congestion, ear pain, rhinorrhea, sinus pressure and sore throat.   Eyes: Negative for pain, redness and visual disturbance.  Respiratory: Negative for cough, shortness of breath and wheezing.   Cardiovascular: Negative for chest pain and palpitations.  Gastrointestinal: Negative for abdominal pain, blood in stool, constipation and diarrhea.  Endocrine: Negative for polydipsia and polyuria.  Genitourinary: Positive for vaginal discharge. Negative for dysuria, frequency and urgency.       Urinary incontinence Sensitive bladder  Musculoskeletal: Positive for arthralgias and joint swelling. Negative for back pain, gait problem and myalgias.  Skin: Negative for pallor and rash.  Allergic/Immunologic: Negative for environmental allergies.  Neurological: Negative for dizziness, syncope and headaches.  Hematological: Negative for adenopathy. Does not bruise/bleed easily.  Psychiatric/Behavioral: Negative for decreased concentration and dysphoric mood. The patient is not nervous/anxious.        Objective:   Physical Exam Constitutional:      General: She is not in acute distress.    Appearance: Normal appearance. She is well-developed. She is obese. She is not ill-appearing or diaphoretic.  HENT:     Head: Normocephalic and atraumatic.     Right Ear: Tympanic membrane, ear canal and external ear normal.     Left Ear: Tympanic membrane, ear canal and external ear normal.     Nose: Nose normal. No congestion.     Mouth/Throat:     Mouth: Mucous membranes are moist.     Pharynx: Oropharynx is clear. No posterior oropharyngeal erythema.  Eyes:     General: No scleral icterus.     Extraocular Movements: Extraocular movements intact.     Conjunctiva/sclera: Conjunctivae normal.     Pupils: Pupils are equal, round, and reactive to light.  Neck:     Musculoskeletal: Normal range of motion and neck supple. No neck rigidity or muscular tenderness.     Thyroid: No thyromegaly.     Vascular: No carotid bruit or JVD.  Cardiovascular:     Rate and Rhythm: Normal rate and regular rhythm.     Pulses: Normal pulses.     Heart sounds: Normal heart sounds. No gallop.   Pulmonary:     Effort: Pulmonary effort is normal. No respiratory distress.     Breath sounds: Normal breath sounds. No wheezing.     Comments: Good air exch Chest:     Chest wall: No tenderness.  Abdominal:     General: Bowel sounds are normal. There is no distension or abdominal bruit.     Palpations: Abdomen is soft. There is no mass.     Tenderness: There is no abdominal tenderness.  Hernia: No hernia is present.  Genitourinary:    Comments: Breast exam: No mass, nodules, thickening, tenderness, bulging, retraction, inflamation, nipple discharge or skin changes noted.  No axillary or clavicular LA.              Anus appears normal w/o hemorrhoids or masses     External genitalia : nl appearance and hair distribution/no lesions     Urethral meatus : nl size, no lesions or prolapse     Urethra: no masses, tenderness or scarring    Bladder : no masses or tenderness  (cystocele noted with sensitive bladder on exam)   Vagina: nl general appearance, no discharge or  Lesions, cystocele noted, some vaginal atrophy -exam is uncomfortable for her  Uterus/cervix surgically absent  No mass noted on bimanual exam  (somewhat limited exam due to obesity)        Musculoskeletal: Normal range of motion.        General: No tenderness.     Right lower leg: No edema.     Left lower leg: No edema.  Lymphadenopathy:     Cervical: No cervical adenopathy.  Skin:    General: Skin is warm and dry.      Coloration: Skin is not pale.     Findings: No erythema or rash.     Comments: Few scars from old comedones Fair  Solar lentigines diffusely   Neurological:     Mental Status: She is alert. Mental status is at baseline.     Cranial Nerves: No cranial nerve deficit.     Motor: No abnormal muscle tone.     Coordination: Coordination normal.     Gait: Gait normal.     Deep Tendon Reflexes: Reflexes are normal and symmetric. Reflexes normal.  Psychiatric:        Mood and Affect: Mood is anxious. Affect is not tearful.        Speech: Speech normal.        Behavior: Behavior normal.        Cognition and Memory: Memory normal.           Assessment & Plan:   Problem List Items Addressed This Visit      Cardiovascular and Mediastinum   Essential hypertension    bp is stable today  No cp or palpitations or headaches or edema  No side effects to medicines  BP Readings from Last 3 Encounters:  04/03/19 126/74  03/24/19 140/80  02/21/19 130/76     Taking hctz        Digestive   GERD    Refilled protonix  She has been symptomatic w/o it Diet rev Wt loss enc      Relevant Medications   pantoprazole (PROTONIX) 40 MG tablet     Endocrine   Type 2 diabetes mellitus with peripheral neuropathy ([place])    Improved Seeing endocrinology Diet is better Neuropathy is bothersome  Not on ace or arb Not on statin-need to discuss further      Hyperlipidemia associated with type 2 diabetes mellitus ([place])    Disc goals for lipids and reasons to control them Rev last labs with pt Rev low sat fat diet in detail Needs to be on statin  Hesitant in light of current pain issues Will discuss        Musculoskeletal and Integument   Psoriasis    Noted on L heel today -plaque      Proximal humerus fracture    Pt is  struggling with pain from this and her rotator cuff tear on L  Plans to f/u with ortho        Other   Morbid obesity ([place])    Discussed how this problem  influences overall health and the risks it imposes  Reviewed plan for weight loss with lower calorie diet (via better food choices and also portion control or program like weight watchers) and exercise building up to or more than 30 minutes 5 days per week including some aerobic activity   Pt is unable to exercise right now due to recent humerus fx      Depression with anxiety    Sees psychiatry Continues sertraline  Reviewed stressors/ coping techniques/symptoms/ support sources/ tx options and side effects in detail today Stressors persist Enc self care      Screening mammogram, encounter for    Scheduled annual screening mammogram Nl breast exam today  Encouraged monthly self exams        Relevant Orders   MM 3D SCREEN BREAST BILATERAL   Routine general medical examination at a health care facility - Primary    Reviewed health habits including diet and exercise and skin cancer prevention Reviewed appropriate screening tests for age  Also reviewed health mt list, fam hx and immunization status , as well as social and family history   See HPI Labs from endo rev Labs today for wellness and HIV/ Hep C screening  Mammogram ordered  Gyn exam done and pap from vaginal cuff         Relevant Orders   CBC with Differential/Platelet   TSH   Encounter for screening for HIV   Relevant Orders   HIV Antibody (routine testing w rflx)   Encounter for hepatitis C screening test for low risk patient    Hep C screen with labs today      Relevant Orders   Hepatitis C antibody   Visit for routine gyn exam    Exam done  Limited by habitus No vag d/c noted  Pap from vag cuff        Relevant Orders   Cytology - PAP([place])   Vitamin D deficiency    Level of 11 from Dr Arman Filter labs Now taking 5000 iu daily  H/o arm and foot fx Will need dexa later when feeling better       Other Visit Diagnoses    Anxiety

## 2019-04-03 NOTE — Assessment & Plan Note (Signed)
Discussed how this problem influences overall health and the risks it imposes  Reviewed plan for weight loss with lower calorie diet (via better food choices and also portion control or program like weight watchers) and exercise building up to or more than 30 minutes 5 days per week including some aerobic activity   Pt is unable to exercise right now due to recent humerus fx

## 2019-04-03 NOTE — Assessment & Plan Note (Signed)
Disc goals for lipids and reasons to control them Rev last labs with pt Rev low sat fat diet in detail Needs to be on statin  Hesitant in light of current pain issues Will discuss

## 2019-04-03 NOTE — Assessment & Plan Note (Signed)
bp is stable today  No cp or palpitations or headaches or edema  No side effects to medicines  BP Readings from Last 3 Encounters:  04/03/19 126/74  03/24/19 140/80  02/21/19 130/76     Taking hctz

## 2019-04-03 NOTE — Patient Instructions (Addendum)
If you are interested in the shingrix vaccine - check with insurance co and you can get it here   Stop at check out to schedule your mammogram   We should consider a bone density test later on   Some wellness labs today   Gyn exam today with pap   I sent in your acid reflux medicine   Continue taking your vitamin D Be as active as you can

## 2019-04-03 NOTE — Assessment & Plan Note (Signed)
Level of 11 from Dr Arman Filter labs Now taking 5000 iu daily  H/o arm and foot fx Will need dexa later when feeling better

## 2019-04-03 NOTE — Assessment & Plan Note (Signed)
Reviewed health habits including diet and exercise and skin cancer prevention Reviewed appropriate screening tests for age  Also reviewed health mt list, fam hx and immunization status , as well as social and family history   See HPI Labs from endo rev Labs today for wellness and HIV/ Hep C screening  Mammogram ordered  Gyn exam done and pap from vaginal cuff

## 2019-04-03 NOTE — Assessment & Plan Note (Signed)
Refilled protonix  She has been symptomatic w/o it Diet rev Wt loss enc

## 2019-04-03 NOTE — Assessment & Plan Note (Signed)
Pt is struggling with pain from this and her rotator cuff tear on L  Plans to f/u with ortho

## 2019-04-03 NOTE — Assessment & Plan Note (Signed)
Exam done  Limited by habitus No vag d/c noted  Pap from vag cuff

## 2019-04-04 ENCOUNTER — Encounter: Payer: Self-pay | Admitting: Family Medicine

## 2019-04-04 DIAGNOSIS — R7989 Other specified abnormal findings of blood chemistry: Secondary | ICD-10-CM | POA: Insufficient documentation

## 2019-04-04 DIAGNOSIS — E039 Hypothyroidism, unspecified: Secondary | ICD-10-CM | POA: Insufficient documentation

## 2019-04-04 LAB — CBC WITH DIFFERENTIAL/PLATELET
Basophils Absolute: 0.1 10*3/uL (ref 0.0–0.1)
Basophils Relative: 1 % (ref 0.0–3.0)
Eosinophils Absolute: 0 10*3/uL (ref 0.0–0.7)
Eosinophils Relative: 0.5 % (ref 0.0–5.0)
HCT: 40.4 % (ref 36.0–46.0)
Hemoglobin: 13.3 g/dL (ref 12.0–15.0)
Lymphocytes Relative: 25.4 % (ref 12.0–46.0)
Lymphs Abs: 2 10*3/uL (ref 0.7–4.0)
MCHC: 33 g/dL (ref 30.0–36.0)
MCV: 83.5 fl (ref 78.0–100.0)
Monocytes Absolute: 0.5 10*3/uL (ref 0.1–1.0)
Monocytes Relative: 6.6 % (ref 3.0–12.0)
Neutro Abs: 5.1 10*3/uL (ref 1.4–7.7)
Neutrophils Relative %: 66.5 % (ref 43.0–77.0)
Platelets: 226 10*3/uL (ref 150.0–400.0)
RBC: 4.84 Mil/uL (ref 3.87–5.11)
RDW: 13.6 % (ref 11.5–15.5)
WBC: 7.7 10*3/uL (ref 4.0–10.5)

## 2019-04-04 LAB — TSH: TSH: 4.91 u[IU]/mL — ABNORMAL HIGH (ref 0.35–4.50)

## 2019-04-04 LAB — HIV ANTIBODY (ROUTINE TESTING W REFLEX): HIV 1&2 Ab, 4th Generation: NONREACTIVE

## 2019-04-04 LAB — HEPATITIS C ANTIBODY
Hepatitis C Ab: NONREACTIVE
SIGNAL TO CUT-OFF: 0.01

## 2019-04-04 NOTE — Telephone Encounter (Signed)
Received note on Rx saying insurance doesn't pay for BID dosing only once daily and ask if you could send a new Rx to them, [facility]

## 2019-04-05 LAB — CYTOLOGY - PAP: Diagnosis: NEGATIVE

## 2019-04-06 ENCOUNTER — Telehealth: Payer: Self-pay | Admitting: *Deleted

## 2019-04-06 NOTE — Telephone Encounter (Signed)
Addressed through results notes  

## 2019-04-06 NOTE — Telephone Encounter (Signed)
Left VM requesting pt to call the office back regarding labs and pap smear

## 2019-04-18 ENCOUNTER — Other Ambulatory Visit: Payer: Self-pay | Admitting: Orthopedic Surgery

## 2019-04-18 DIAGNOSIS — S42202D Unspecified fracture of upper end of left humerus, subsequent encounter for fracture with routine healing: Secondary | ICD-10-CM

## 2019-04-24 DIAGNOSIS — H5203 Hypermetropia, bilateral: Secondary | ICD-10-CM | POA: Diagnosis not present

## 2019-04-24 DIAGNOSIS — H04123 Dry eye syndrome of bilateral lacrimal glands: Secondary | ICD-10-CM | POA: Diagnosis not present

## 2019-04-24 DIAGNOSIS — H524 Presbyopia: Secondary | ICD-10-CM | POA: Diagnosis not present

## 2019-04-24 DIAGNOSIS — E113293 Type 2 diabetes mellitus with mild nonproliferative diabetic retinopathy without macular edema, bilateral: Secondary | ICD-10-CM | POA: Diagnosis not present

## 2019-04-26 ENCOUNTER — Other Ambulatory Visit: Payer: Self-pay

## 2019-04-26 ENCOUNTER — Ambulatory Visit
Admission: RE | Admit: 2019-04-26 | Discharge: 2019-04-26 | Disposition: A | Discharge status: Home / Self Care | Payer: No Typology Code available for payment source | Source: Ambulatory Visit | Attending: Orthopedic Surgery | Admitting: Orthopedic Surgery

## 2019-04-26 DIAGNOSIS — S42202D Unspecified fracture of upper end of left humerus, subsequent encounter for fracture with routine healing: Secondary | ICD-10-CM | POA: Diagnosis not present

## 2019-04-26 DIAGNOSIS — S42212K Unspecified displaced fracture of surgical neck of left humerus, subsequent encounter for fracture with nonunion: Secondary | ICD-10-CM | POA: Diagnosis not present

## 2019-05-01 DIAGNOSIS — Z5181 Encounter for therapeutic drug level monitoring: Secondary | ICD-10-CM | POA: Diagnosis not present

## 2019-05-01 DIAGNOSIS — G894 Chronic pain syndrome: Secondary | ICD-10-CM | POA: Diagnosis not present

## 2019-05-01 DIAGNOSIS — Z79899 Other long term (current) drug therapy: Secondary | ICD-10-CM | POA: Diagnosis not present

## 2019-05-08 DIAGNOSIS — S42202D Unspecified fracture of upper end of left humerus, subsequent encounter for fracture with routine healing: Secondary | ICD-10-CM | POA: Diagnosis not present

## 2019-05-16 ENCOUNTER — Other Ambulatory Visit: Payer: Self-pay | Admitting: Family Medicine

## 2019-05-17 ENCOUNTER — Telehealth: Payer: Self-pay | Admitting: Family Medicine

## 2019-05-17 DIAGNOSIS — R7989 Other specified abnormal findings of blood chemistry: Secondary | ICD-10-CM

## 2019-05-17 NOTE — Telephone Encounter (Signed)
-----   Message from Ellamae Sia sent at 05/09/2019  3:32 PM EST ----- Regarding: lab orders for Thursday, 12.17.20 Lab orders, Thyroid???

## 2019-05-18 ENCOUNTER — Other Ambulatory Visit: Payer: Self-pay

## 2019-05-18 ENCOUNTER — Other Ambulatory Visit (INDEPENDENT_AMBULATORY_CARE_PROVIDER_SITE_OTHER): Payer: No Typology Code available for payment source

## 2019-05-18 DIAGNOSIS — R7989 Other specified abnormal findings of blood chemistry: Secondary | ICD-10-CM | POA: Diagnosis not present

## 2019-05-19 LAB — T4, FREE: Free T4: 0.76 ng/dL (ref 0.60–1.60)

## 2019-05-19 LAB — TSH: TSH: 6.76 u[IU]/mL — ABNORMAL HIGH (ref 0.35–4.50)

## 2019-05-20 ENCOUNTER — Other Ambulatory Visit: Payer: Self-pay | Admitting: Family Medicine

## 2019-05-22 ENCOUNTER — Telehealth: Payer: Self-pay | Admitting: Family Medicine

## 2019-05-22 DIAGNOSIS — E039 Hypothyroidism, unspecified: Secondary | ICD-10-CM

## 2019-05-22 MED ORDER — LEVOTHYROXINE SODIUM 50 MCG PO TABS: 50.0000 ug | ORAL_TABLET | Freq: Every day | ORAL | 5 refills | Status: DC

## 2019-05-22 NOTE — Telephone Encounter (Signed)
Pt notified Rx sent I advise pt of when to take meds, Dr. Marliss Coots comments relayed to pt and f/u lab appt scheduled

## 2019-05-22 NOTE — Telephone Encounter (Signed)
I sent a px for levothyroxine 50 mcg to her local phamacy  Take in am 30 min before breakfast and avoid any vitamins with calcium for 3 hours at least  Re check TSH in about 6 weeks please   Let me know if the swallowing issue persists

## 2019-05-22 NOTE — Telephone Encounter (Signed)
-----   Message from Tammi Sou, [place] sent at 05/22/2019  3:55 PM EST ----- Pt notified of lab results and Dr. Marliss Coots comments. Pt said the only sxs she has is dry skin but she thought it was related to her DM. Pt did also ask if trouble swallowing could be due to thyroid issues. Pt said it doesn't hurt to swallow but it feels like food is getting "stuck" in her throat, pt said it's worsening but she just thought it was related to her gastroparesis she has but now since thyroid levels are abnormal she wanted to make sure Dr. Glori Bickers didn't think it could be related to thyroid. Pt said she will do what every Dr. Glori Bickers thinks regarding starting meds

## 2019-05-24 DIAGNOSIS — M25512 Pain in left shoulder: Secondary | ICD-10-CM | POA: Diagnosis not present

## 2019-05-24 DIAGNOSIS — S42202D Unspecified fracture of upper end of left humerus, subsequent encounter for fracture with routine healing: Secondary | ICD-10-CM | POA: Diagnosis not present

## 2019-06-19 ENCOUNTER — Other Ambulatory Visit: Payer: Self-pay | Admitting: Internal Medicine

## 2019-06-21 ENCOUNTER — Telehealth: Payer: Self-pay

## 2019-06-21 NOTE — Telephone Encounter (Signed)
Patient called in wanting to let Dr know her sugar levels keep dropping and wants to know if she needs to reduce her insulin     Please call and advise

## 2019-06-22 NOTE — Telephone Encounter (Signed)
I spoke to the patient, it was difficult to get the information I needed.  Patient states on 06/17/2019 her CBG dropped into the 50's and she was able to eat and bring it back up.  From what I understand from my conversation with her it has not happened again.  Patient CGB today 157 in the morning and 173 in the evening.  Today she has only checked it once at it was 133  I reviewed her insulin and on the Lantus she is taking 30 units a day.  Humalog she states she is only doing 10 units TID

## 2019-06-22 NOTE — Telephone Encounter (Signed)
I am not sure how to adjust the regimen based on only 1 low CBG. Can she pinpoint to what caused the low? Was she more active before, did she not eat? If this is an isolated incident, would continue to follow the CBGs and let me know if it drops again.

## 2019-07-04 ENCOUNTER — Other Ambulatory Visit: Payer: Self-pay

## 2019-07-04 ENCOUNTER — Other Ambulatory Visit (INDEPENDENT_AMBULATORY_CARE_PROVIDER_SITE_OTHER): Payer: No Typology Code available for payment source

## 2019-07-04 DIAGNOSIS — E039 Hypothyroidism, unspecified: Secondary | ICD-10-CM

## 2019-07-05 ENCOUNTER — Telehealth: Payer: Self-pay | Admitting: Family Medicine

## 2019-07-05 DIAGNOSIS — E039 Hypothyroidism, unspecified: Secondary | ICD-10-CM

## 2019-07-05 DIAGNOSIS — M25512 Pain in left shoulder: Secondary | ICD-10-CM | POA: Diagnosis not present

## 2019-07-05 DIAGNOSIS — S42202D Unspecified fracture of upper end of left humerus, subsequent encounter for fracture with routine healing: Secondary | ICD-10-CM | POA: Diagnosis not present

## 2019-07-05 LAB — TSH: TSH: 5.14 u[IU]/mL — ABNORMAL HIGH (ref 0.35–4.50)

## 2019-07-05 MED ORDER — LEVOTHYROXINE SODIUM 75 MCG PO TABS: 75.0000 ug | ORAL_TABLET | Freq: Every day | ORAL | 3 refills | Status: DC

## 2019-07-05 NOTE — Telephone Encounter (Signed)
Pt notified Rx sent to pharmacy and lab appt scheduled  

## 2019-07-05 NOTE — Telephone Encounter (Signed)
-----   Message from Tammi Sou, [place] sent at 07/05/2019 12:29 PM EST ----- Pt notified of lab results and Dr. Marliss Coots comments. Pt is taking meds 1st thing in the morning at least 30 min -1 hr before other meds and eating, pt also hasn't missed any doses of thyroid med,  [facility]

## 2019-07-05 NOTE — Telephone Encounter (Signed)
I sent in the 75 mcg dose  Re check TSH in 4-6 wk please

## 2019-07-28 ENCOUNTER — Encounter: Payer: Self-pay | Admitting: Internal Medicine

## 2019-07-28 ENCOUNTER — Ambulatory Visit (INDEPENDENT_AMBULATORY_CARE_PROVIDER_SITE_OTHER): Payer: No Typology Code available for payment source | Admitting: Internal Medicine

## 2019-07-28 ENCOUNTER — Other Ambulatory Visit: Payer: Self-pay

## 2019-07-28 VITALS — BP 118/60 | HR 76 | Ht 65.25 in | Wt 253.0 lb

## 2019-07-28 DIAGNOSIS — E559 Vitamin D deficiency, unspecified: Secondary | ICD-10-CM | POA: Diagnosis not present

## 2019-07-28 DIAGNOSIS — E1142 Type 2 diabetes mellitus with diabetic polyneuropathy: Secondary | ICD-10-CM | POA: Diagnosis not present

## 2019-07-28 DIAGNOSIS — E785 Hyperlipidemia, unspecified: Secondary | ICD-10-CM | POA: Diagnosis not present

## 2019-07-28 LAB — POCT GLYCOSYLATED HEMOGLOBIN (HGB A1C): Hemoglobin A1C: 8.6 % — AB (ref 4.0–5.6)

## 2019-07-28 MED ORDER — INSULIN GLARGINE 100 UNIT/ML ~~LOC~~ SOLN: 35.0000 [IU] | Freq: Every day | SUBCUTANEOUS | 3 refills | Status: DC

## 2019-07-28 MED ORDER — INSULIN REGULAR HUMAN 100 UNIT/ML IJ SOLN: INTRAMUSCULAR | 9 refills | Status: DC

## 2019-07-28 NOTE — Patient Instructions (Addendum)
Please continue: - Lantus 30 units at bedtime - Humulin R insulin:  10-14 units before b'fast  10-14 units before lunch 10-14 units before dinner If you plan to be more active after a meal, please reduce the insulin before that meal by 5 units.  Please eliminate the concentrated sweets and fatty foods.  Please come back for a follow-up appointment in 4 months.
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
## 2019-07-29 LAB — MICROALBUMIN / CREATININE URINE RATIO
Creatinine, Urine: 209 mg/dL (ref 20–275)
Microalb Creat Ratio: 13 mcg/mg creat (ref <30)
Microalb, Ur: 2.7 mg/dL

## 2019-07-29 LAB — VITAMIN D 25 HYDROXY (VIT D DEFICIENCY, FRACTURES): Vit D, 25-Hydroxy: 19 ng/mL — ABNORMAL LOW (ref 30–100)

## 2019-07-31 ENCOUNTER — Telehealth: Payer: Self-pay

## 2019-07-31 NOTE — Telephone Encounter (Signed)
Notified patient of message from Dr. Gherghe, patient expressed understanding and agreement. No further questions.  

## 2019-07-31 NOTE — Telephone Encounter (Signed)
-----   Message from [name], MD sent at 07/31/2019 11:11 AM EST ----- Lenna Sciara, can you please call pt:  Urinary proteins are normal.  Vitamin D is only slightly improved, but still very low.  Please restart the 5000 units vitamin D daily.  We will recheck the level at next visit.

## 2019-08-07 ENCOUNTER — Telehealth: Payer: Self-pay

## 2019-08-07 NOTE — Telephone Encounter (Signed)
LVM to call clinic, pt needs COVID screen 3.8.2021 TLJ

## 2019-08-09 ENCOUNTER — Other Ambulatory Visit (INDEPENDENT_AMBULATORY_CARE_PROVIDER_SITE_OTHER): Payer: No Typology Code available for payment source

## 2019-08-09 ENCOUNTER — Other Ambulatory Visit: Payer: Self-pay

## 2019-08-09 DIAGNOSIS — E039 Hypothyroidism, unspecified: Secondary | ICD-10-CM | POA: Diagnosis not present

## 2019-08-10 ENCOUNTER — Telehealth: Payer: Self-pay

## 2019-08-10 LAB — TSH: TSH: 3.52 u[IU]/mL (ref 0.35–4.50)

## 2019-08-10 NOTE — Telephone Encounter (Signed)
LVM for pt to call for lab results. PCP msg below for results.  Tower, Wynelle Fanny, MD  08/10/2019 3:32 PM EST    TSH is in the normal range  Stay on current dose of levothyroxine  Refill for year if needed

## 2019-08-24 ENCOUNTER — Encounter (HOSPITAL_COMMUNITY): Payer: Self-pay

## 2019-08-24 ENCOUNTER — Inpatient Hospital Stay (HOSPITAL_COMMUNITY)
Admission: EM | Admit: 2019-08-24 | Discharge: 2019-08-26 | DRG: 103 | Disposition: A | Discharge status: Home / Self Care | Payer: PRIVATE HEALTH INSURANCE | Attending: Internal Medicine | Admitting: Internal Medicine

## 2019-08-24 ENCOUNTER — Other Ambulatory Visit: Payer: Self-pay

## 2019-08-24 DIAGNOSIS — E1142 Type 2 diabetes mellitus with diabetic polyneuropathy: Secondary | ICD-10-CM | POA: Diagnosis present

## 2019-08-24 DIAGNOSIS — G4733 Obstructive sleep apnea (adult) (pediatric): Secondary | ICD-10-CM | POA: Diagnosis present

## 2019-08-24 DIAGNOSIS — G44009 Cluster headache syndrome, unspecified, not intractable: Secondary | ICD-10-CM | POA: Diagnosis not present

## 2019-08-24 DIAGNOSIS — G510 Bell's palsy: Secondary | ICD-10-CM | POA: Diagnosis present

## 2019-08-24 DIAGNOSIS — E039 Hypothyroidism, unspecified: Secondary | ICD-10-CM | POA: Diagnosis present

## 2019-08-24 DIAGNOSIS — Z20822 Contact with and (suspected) exposure to covid-19: Secondary | ICD-10-CM | POA: Diagnosis present

## 2019-08-24 DIAGNOSIS — Z800 Family history of malignant neoplasm of digestive organs: Secondary | ICD-10-CM

## 2019-08-24 DIAGNOSIS — F329 Major depressive disorder, single episode, unspecified: Secondary | ICD-10-CM | POA: Diagnosis present

## 2019-08-24 DIAGNOSIS — E1169 Type 2 diabetes mellitus with other specified complication: Secondary | ICD-10-CM | POA: Diagnosis present

## 2019-08-24 DIAGNOSIS — N390 Urinary tract infection, site not specified: Secondary | ICD-10-CM | POA: Diagnosis present

## 2019-08-24 DIAGNOSIS — H5711 Ocular pain, right eye: Secondary | ICD-10-CM | POA: Diagnosis present

## 2019-08-24 DIAGNOSIS — Z885 Allergy status to narcotic agent status: Secondary | ICD-10-CM

## 2019-08-24 DIAGNOSIS — L409 Psoriasis, unspecified: Secondary | ICD-10-CM | POA: Diagnosis present

## 2019-08-24 DIAGNOSIS — I10 Essential (primary) hypertension: Secondary | ICD-10-CM | POA: Diagnosis present

## 2019-08-24 DIAGNOSIS — Z888 Allergy status to other drugs, medicaments and biological substances status: Secondary | ICD-10-CM

## 2019-08-24 DIAGNOSIS — K219 Gastro-esophageal reflux disease without esophagitis: Secondary | ICD-10-CM | POA: Diagnosis present

## 2019-08-24 DIAGNOSIS — Z6841 Body Mass Index (BMI) 40.0 and over, adult: Secondary | ICD-10-CM

## 2019-08-24 DIAGNOSIS — Z79899 Other long term (current) drug therapy: Secondary | ICD-10-CM

## 2019-08-24 DIAGNOSIS — Z96653 Presence of artificial knee joint, bilateral: Secondary | ICD-10-CM | POA: Diagnosis present

## 2019-08-24 DIAGNOSIS — H538 Other visual disturbances: Secondary | ICD-10-CM | POA: Diagnosis present

## 2019-08-24 DIAGNOSIS — Z882 Allergy status to sulfonamides status: Secondary | ICD-10-CM

## 2019-08-24 DIAGNOSIS — Z811 Family history of alcohol abuse and dependence: Secondary | ICD-10-CM

## 2019-08-24 DIAGNOSIS — M8949 Other hypertrophic osteoarthropathy, multiple sites: Secondary | ICD-10-CM | POA: Diagnosis present

## 2019-08-24 DIAGNOSIS — E860 Dehydration: Secondary | ICD-10-CM | POA: Diagnosis present

## 2019-08-24 DIAGNOSIS — F1011 Alcohol abuse, in remission: Secondary | ICD-10-CM | POA: Diagnosis present

## 2019-08-24 DIAGNOSIS — R519 Headache, unspecified: Secondary | ICD-10-CM | POA: Diagnosis present

## 2019-08-24 DIAGNOSIS — K5909 Other constipation: Secondary | ICD-10-CM | POA: Diagnosis present

## 2019-08-24 DIAGNOSIS — Z794 Long term (current) use of insulin: Secondary | ICD-10-CM

## 2019-08-24 DIAGNOSIS — Z8249 Family history of ischemic heart disease and other diseases of the circulatory system: Secondary | ICD-10-CM

## 2019-08-24 DIAGNOSIS — Z801 Family history of malignant neoplasm of trachea, bronchus and lung: Secondary | ICD-10-CM

## 2019-08-24 DIAGNOSIS — R6884 Jaw pain: Secondary | ICD-10-CM | POA: Diagnosis present

## 2019-08-24 DIAGNOSIS — J45909 Unspecified asthma, uncomplicated: Secondary | ICD-10-CM | POA: Diagnosis present

## 2019-08-24 DIAGNOSIS — Z79890 Hormone replacement therapy (postmenopausal): Secondary | ICD-10-CM

## 2019-08-24 DIAGNOSIS — E785 Hyperlipidemia, unspecified: Secondary | ICD-10-CM | POA: Diagnosis present

## 2019-08-24 DIAGNOSIS — Z87891 Personal history of nicotine dependence: Secondary | ICD-10-CM

## 2019-08-24 DIAGNOSIS — G894 Chronic pain syndrome: Secondary | ICD-10-CM | POA: Diagnosis present

## 2019-08-24 DIAGNOSIS — E11319 Type 2 diabetes mellitus with unspecified diabetic retinopathy without macular edema: Secondary | ICD-10-CM | POA: Diagnosis present

## 2019-08-24 DIAGNOSIS — Z8673 Personal history of transient ischemic attack (TIA), and cerebral infarction without residual deficits: Secondary | ICD-10-CM

## 2019-08-24 DIAGNOSIS — R112 Nausea with vomiting, unspecified: Secondary | ICD-10-CM | POA: Diagnosis present

## 2019-08-24 DIAGNOSIS — F419 Anxiety disorder, unspecified: Secondary | ICD-10-CM | POA: Diagnosis present

## 2019-08-24 MED ORDER — SODIUM CHLORIDE 0.9% FLUSH: 3.0000 mL | Freq: Once | INTRAVENOUS | Status: DC

## 2019-08-24 NOTE — ED Triage Notes (Signed)
PT present with a right sided headache x2 days that starts in the back of her right eye that goes to the back of her head. Pt endorses light sensitivity, denies vision changes. Pt states tonight at 2000 she began vomiting and having nausea. Pt states she is constipated, which is normal for her, and burning with urination. Pt is A&Ox4 and ambulatory in triage.

## 2019-08-24 NOTE — ED Provider Notes (Signed)
[facility] DEPT Provider Note   CSN: [id number] Arrival date & time: 08/24/19  2245     History Chief Complaint  Patient presents with  . Emesis  . Headache    Alexa Woods is a 59 y.o. female.  HPI 59 y.o. female with complaints/comments per nursing/medical assistant note, with all such history reviewed for accuracy and confirmed by myself.  The patient's relevant past medical, surgical and social history was reviewed in [place].  HPI Comments: Alexa Woods is a 59 y.o. female who presents to the Emergency Department complaining of a 10/10, waxing and waning, HA x 2 days. Pt states that she began experiencing her HA which "starts at my right eye" and radiates to her occipital scalp. Her pain worsens with light exposure. She takes 10/325 oxycodone TID for chronic pain but denies she has had a dose since this AM. She denies any significant relief of her pain when taking this. She does not have a history of migraines. She reports associated n/v that started while eating dinner this evening. She reports ~10x vomiting. She does not smoke or drink alcohol.   She additionally complains of one week of dysuria. She denies any hematuria. She has a history of hysterectomy.  She denies fevers, chills, URI symptoms, chest pain, shortness of breath, dizziness, numbness, tingling, syncope.     Past Medical History:  Diagnosis Date  . Alcohol abuse, in remission    since 1998  . Allergic rhinitis   . Anxiety   . Asthma   . Bilateral lower extremity edema   . Bulging lumbar disc   . Bulging of cervical intervertebral disc   . Chronic constipation   . DDD (degenerative disc disease), lumbosacral   . Depression   . Dyspnea   . GERD (gastroesophageal reflux disease)   . Hemorrhoids   . History of Bell's palsy 08/2007   left  . History of chronic cystitis   . IBS (irritable bowel syndrome)   . Insulin dependent type 2 diabetes mellitus [facility])    endocrinologist-- dr Cruzita Lederer  . Lower urinary tract symptoms (LUTS)   . OA (osteoarthritis)    knees, back, hands, elbows  . OSA (obstructive sleep apnea)    per study 06-08-2017 mild osa , cpap recommended , per pt insurance issue  . Peripheral neuropathy   . PONV (postoperative nausea and vomiting)   . Psoriasis   . S/P dilatation of esophageal stricture   . Unspecified essential hypertension   . Wears partial dentures    lower    Patient Active Problem List   Diagnosis Date Noted  . Hypothyroid 04/04/2019  . Screening mammogram, encounter for 04/03/2019  . Routine general medical examination at a health care facility 04/03/2019  . Encounter for screening for HIV 04/03/2019  . Encounter for hepatitis C screening test for low risk patient 04/03/2019  . Visit for routine gyn exam 04/03/2019  . Vitamin D deficiency 04/03/2019  . Proximal humerus fracture 02/21/2019  . Intertrigo 02/21/2019  . History of left knee replacement 12/26/2017  . Primary osteoarthritis of left knee 12/06/2017  . Pre-operative exam 04/05/2017  . Class 3 obesity with serious comorbidity and body mass index (BMI) of 40.0 to 44.9 in adult 01/21/2017  . Dysuria 08/02/2015  . Joint swelling 12/20/2013  . Periodontal disease 02/03/2013  . Hyperlipidemia associated with type 2 diabetes mellitus ([place]) 09/27/2012  . DYSPHAGIA UNSPECIFIED 08/07/2010  . Low back pain 07/22/2010  . Hypokalemia 12/12/2008  .  Morbid obesity ([place]) 03/30/2008  . 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  . Type 2 diabetes mellitus with peripheral neuropathy ([place]) 11/30/2006  . History of alcohol abuse 11/30/2006  . Depression with anxiety 11/30/2006  . Essential hypertension 11/30/2006  . HEMORRHOIDS 11/30/2006  . Allergic rhinitis 11/30/2006  . Asthma 11/30/2006  . GERD 11/30/2006  . Psoriasis 11/30/2006    Past Surgical History:  Procedure Laterality Date  . CHOLECYSTECTOMY OPEN  1990   AND APPENDECTOMY  . DOBUTAMINE STRESS  ECHO  2009    normal stress echo, no evidence of ischemia  . ELBOW SURGERY Bilateral RIGHT 2013;  LEFT 2016   for nerve damage  . ESOPHAGOGASTRODUODENOSCOPY  07/2002   erythematous gastropathy  . KNEE ARTHROSCOPY Left 11/ 2018   dr Wynelle Link  @ [facility]  . KNEE ARTHROSCOPY W/ PARTIAL MEDIAL MENISCECTOMY Right 10/2008   and chondroplasty  . SHOULDER ARTHROSCOPY WITH DISTAL CLAVICLE RESECTION Left 12/ 2011   dr dean  . TOTAL KNEE ARTHROPLASTY Right 07-01-2009  dr Wynelle Link   [facility]  . TOTAL KNEE ARTHROPLASTY Left 12/06/2017   Procedure: LEFT TOTAL LEFT KNEE ARTHROPLASTY;  Surgeon: Gaynelle Arabian, MD;  Location: [facility];  Service: Orthopedics;  Laterality: Left;  Marland Kitchen VAGINAL HYSTERECTOMY  2003     OB History   No obstetric history on file.     Family History  Problem Relation Age of Onset  . Alcohol abuse Mother   . Hypertension Mother   . Esophageal cancer Maternal Grandmother   . Lung cancer Other        mat great uncle  . Colon cancer Neg Hx     Social History   Tobacco Use  . Smoking status: Former Smoker    Years: 25.00    Types: Cigarettes    Quit date: 12/30/2001    Years since quitting: 17.6  . Smokeless tobacco: Never Used  Substance Use Topics  . Alcohol use: No    Alcohol/week: 0.0 standard drinks    Comment: hx alcoholism--  stopped 1998   . Drug use: No    Home Medications Prior to Admission medications   Medication Sig Start Date End Date Taking? Authorizing Provider  albuterol (VENTOLIN HFA) 108 (90 Base) MCG/ACT inhaler Inhale 2 puffs into the lungs every 4 (four) hours as needed for wheezing or shortness of breath. 02/21/19  Yes Tower, Wynelle Fanny, MD  BD INSULIN SYRINGE U/F 31G X 5/16" 0.5 ML MISC USE THREE TIMES PER DAY WITH R INSULIN & ONCE DAILY WITH LEVEMIR 06/19/19  Yes [name], MD  docusate sodium (COLACE) 100 MG capsule Take 100 mg by mouth every morning.    Yes [provider]  fluticasone (FLONASE) 50 MCG/ACT nasal spray Place 2 sprays into both  nostrils daily as needed for allergies or rhinitis. 02/21/19  Yes Tower, Marne A, MD  glucose blood (ONE TOUCH ULTRA TEST) test strip Use to test blood sugar 4 times daily as instructed. 01/25/18  Yes [name], MD  hydrochlorothiazide (HYDRODIURIL) 25 MG tablet TAKE 1 TABLET BY MOUTH EVERY DAY Patient taking differently: Take 25 mg by mouth daily.  05/20/19  Yes Tower, Wynelle Fanny, MD  insulin glargine (LANTUS) 100 UNIT/ML injection Inject 0.35 mLs (35 Units total) into the skin daily. Patient taking differently: Inject 30 Units into the skin daily.  07/28/19  Yes [name], MD  insulin regular (HUMULIN R) 100 units/mL injection INJECT 0.10-0.14 ML'S  INTO THE SKIN THREE TIMES A DAY BEFORE MEALS. Patient taking differently: Inject  10-15 Units into the skin 3 (three) times daily before meals. INJECT 0.10-0.14 ML'S  INTO THE SKIN THREE TIMES A DAY BEFORE MEALS Per sliding scale 07/28/19  Yes [name], MD  KLOR-CON M10 10 MEQ tablet TAKE 1 TABLET BY MOUTH EVERY DAY Patient taking differently: Take 10 mEq by mouth daily.  05/16/19  Yes Tower, Wynelle Fanny, MD  Lancets [facility] ULTRASOFT) lancets Use to test blood sugar 4 times daily as instructed. 10/04/18  Yes [name], MD  levothyroxine (SYNTHROID) 75 MCG tablet Take 1 tablet (75 mcg total) by mouth daily. 07/05/19  Yes Tower, Wynelle Fanny, MD  LORazepam (ATIVAN) 1 MG tablet Take 2 mg by mouth 2 (two) times daily as needed for anxiety or sleep.    Yes [provider]  oxyCODONE-acetaminophen (PERCOCET) 10-325 MG tablet Take 1 tablet by mouth every 4 (four) hours as needed for pain. Hold for SBP < = 105 12/15/17  Yes Medina-Vargas, Monina C, NP  pantoprazole (PROTONIX) 40 MG tablet TAKE 1 TABLET BY MOUTH EVERY DAY Patient taking differently: Take 40 mg by mouth daily.  04/04/19  Yes Tower, Wynelle Fanny, MD  polyethylene glycol (MIRALAX / GLYCOLAX) packet Take 17 g by mouth daily as needed for mild constipation or moderate constipation.     Yes [provider]  senna (SENOKOT) 8.6 MG TABS tablet Take 1 tablet by mouth daily.    Yes [provider]  sertraline (ZOLOFT) 100 MG tablet Take 200 mg by mouth every morning.    Yes [provider]  tiZANidine (ZANAFLEX) 4 MG tablet Take 4 mg by mouth 2 (two) times daily as needed for muscle spasms.  07/10/14  Yes [provider]  triamcinolone cream (KENALOG) 0.1 % Apply 1 application topically 2 (two) times daily. To psoriasis areas 08/22/18  Yes Tower, Wynelle Fanny, MD  UNABLE TO FIND Diet type : NCS   Yes [provider]  zolpidem (AMBIEN) 10 MG tablet Take 10 mg by mouth at bedtime as needed for sleep.    Yes [provider]  beclomethasone (QVAR) 80 MCG/ACT inhaler Inhale 2 puffs 2 (two) times daily into the lungs. Rinse mouth after use. Patient not taking: Reported on 08/25/2019 04/08/17   Laverle Hobby, MD    Allergies    Bupropion hcl, Cefuroxime axetil, Darvon [propoxyphene], Gabapentin, Lidocaine, Metformin, Paroxetine, Tramadol hcl, Codeine, and Sulfa antibiotics  Review of Systems   Review of Systems  Constitutional: Positive for appetite change. Negative for chills and fever.  HENT: Negative for congestion and rhinorrhea.   Eyes: Positive for pain.  Respiratory: Negative for shortness of breath.   Cardiovascular: Negative for chest pain and leg swelling.  Gastrointestinal: Positive for abdominal pain, constipation (chronic), nausea and vomiting. Negative for diarrhea.  Genitourinary: Positive for dysuria. Negative for hematuria, urgency, vaginal bleeding and vaginal discharge.  Neurological: Positive for headaches. Negative for dizziness, syncope, weakness and numbness.  All other systems reviewed and are negative.   Physical Exam Updated Vital Signs BP (!) 179/73 (BP Location: Right Arm)   Pulse 70   Temp 97.7 F (36.5 C) (Oral)   Resp 16   Ht 5\' 6"  (1.676 m)   Wt 113.4 kg   SpO2 97%   BMI 40.35 kg/m    Physical Exam Constitutional:      General: She is not in acute distress.    Appearance: She is well-developed. She is obese. She is not ill-appearing, toxic-appearing or diaphoretic.     Comments: Caucasian female  sitting upright and speaking clearly and coherently. She intermittently retches during the exam.  HENT:     Head: Normocephalic and atraumatic.     Comments: No TMJ.  No temporal tenderness noted bilaterally.    Mouth/Throat:     Mouth: Mucous membranes are moist.  Eyes:     General: No scleral icterus.    Extraocular Movements: Extraocular movements intact.     Right eye: Normal extraocular motion.     Left eye: Normal extraocular motion.     Pupils: Pupils are equal, round, and reactive to light. Pupils are equal.     Comments: Extraocular movements intact.    Cardiovascular:     Rate and Rhythm: Normal rate and regular rhythm.     Heart sounds: Murmur (systolic murmur) present.  Pulmonary:     Effort: Pulmonary effort is normal. No respiratory distress.     Breath sounds: Normal breath sounds. No stridor. No wheezing, rhonchi or rales.     Comments: LCTAB Abdominal:     Palpations: Abdomen is soft.     Tenderness: There is abdominal tenderness (mild epigastric tenderness).  Musculoskeletal:        General: Normal range of motion.     Cervical back: Normal range of motion and neck supple.  Skin:    General: Skin is warm and dry.     Capillary Refill: Capillary refill takes less than 2 seconds.  Neurological:     Mental Status: She is alert and oriented to person, place, and time.     Cranial Nerves: No cranial nerve deficit, dysarthria or facial asymmetry.     Sensory: No sensory deficit.     Motor: No weakness.     Coordination: Romberg sign negative.     Comments: Strength 5/5 in the bilateral upper and lower extremities. Distal sensation intact to light touch. Finger to nose intact bilaterally.  Negative Romberg.  Patient ambulates with a steady gait.   Psychiatric:        Mood and Affect: Mood normal.        Behavior: Behavior normal.    ED Results / Procedures / Treatments   Labs (all labs ordered are listed, but only abnormal results are displayed) Labs Reviewed  COMPREHENSIVE METABOLIC PANEL - Abnormal; Notable for the following components:      Result Value   Glucose, Bld 160 (*)    Total Protein 8.4 (*)    Alkaline Phosphatase 143 (*)    All other components within normal limits  CBC - Abnormal; Notable for the following components:   WBC 13.5 (*)    All other components within normal limits  I-STAT BETA HCG BLOOD, ED ([facility], [facility], [facility] ONLY) - Abnormal; Notable for the following components:   I-stat hCG, quantitative 14.8 (*)    All other components within normal limits  LIPASE, BLOOD  URINALYSIS, ROUTINE W REFLEX MICROSCOPIC  HCG, QUANTITATIVE, PREGNANCY  POC SARS CORONAVIRUS 2 AG -  ED   EKG None  Radiology CT Head Wo Contrast  Result Date: 08/25/2019 CLINICAL DATA:  Headache EXAM: CT HEAD WITHOUT CONTRAST TECHNIQUE: Contiguous axial images were obtained from the base of the skull through the vertex without intravenous contrast. COMPARISON:  May 22 2009 FINDINGS: Brain: No evidence of acute territorial infarction, hemorrhage, hydrocephalus,extra-axial collection or mass lesion/mass effect. There is dilatation the ventricles and sulci consistent with age-related atrophy. There is a subtle area of periventricular white matter change seen adjacent to the left frontal horn, series 2, image  18. Vascular: No hyperdense vessel or unexpected calcification. Skull: The skull is intact. No fracture or focal lesion identified. Sinuses/Orbits: The visualized paranasal sinuses and mastoid air cells are clear. The orbits and globes intact. Other: None IMPRESSION: Findings which could be suggestive of age indeterminate lacunar infarct or chronic small vessel ischemia involving the left frontal lobe. Findings consistent with age related  atrophy Electronically Signed   By: Prudencio Pair M.D.   On: 08/25/2019 01:38   Procedures Procedures   Medications Ordered in ED Medications  sodium chloride flush (NS) 0.9 % injection 3 mL (has no administration in time range)  metoCLOPramide (REGLAN) injection 10 mg (10 mg Intravenous Given 08/25/19 0106)  diphenhydrAMINE (BENADRYL) injection 12.5 mg (12.5 mg Intravenous Given 08/25/19 0107)  sodium chloride 0.9 % bolus 500 mL (0 mLs Intravenous Stopped 08/25/19 0210)  ondansetron (ZOFRAN) injection 4 mg (4 mg Intravenous Given 08/25/19 0209)  ketorolac (TORADOL) 15 MG/ML injection 30 mg (30 mg Intravenous Given 08/25/19 0308)  magnesium sulfate IVPB 2 g 50 mL (2 g Intravenous New Bag/Given 08/25/19 0319)  dexamethasone (DECADRON) injection 10 mg (10 mg Intravenous Given 08/25/19 0311)  promethazine (PHENERGAN) injection 12.5 mg (12.5 mg Intravenous Given 08/25/19 0322)    ED Course  I have reviewed the triage vital signs and the nursing notes.  Pertinent labs & imaging results that were available during my care of the patient were reviewed by me and considered in my medical decision making (see chart for details).  Clinical Course as of Aug 24 337  Fri Aug 25, 2019  0303 BP(!): 179/73 [LJ]  [id number] Discussed patient's case with hospitalist, Dr. Cyd Silence. Admitting physician will place admission orders.      [HM]    Clinical Course User Index [HM] Muthersbaugh, Jarrett Soho, PA-C [LJ] Rayna Sexton, PA-C   MDM Rules/Calculators/A&P                      12:50 AM patient is a 59 year old Caucasian female who presents with 2 days of right-sided headache that radiates to her occipital region as well as nausea and intractable vomiting that started this evening while eating dinner.  She is retching throughout the exam.  Physical exam is significant for epigastric tenderness.  Neuro exam is benign.  She has no history of headaches or migraines so will order CT of the head, basic labs, UA. Reglan  and diphenhydramine for n/v. Will give 500cc bolus. Will closely monitor.   1:49 AM patient states that her nausea and eye pain are still significant. Will give an additional dose of Zofran.    2:53 AM Her pain is still 7/10 around her right eye. She is still experiencing n/v. Discussed with my attending physician Dr. Leonides Schanz due to her CT scan showing age indeterminate lacunar infarct. Will give decadron, magnesium and toradol IV for pain and phenergan for n/v. Will order MRI. Will discuss with hospitalist for possible admission due to intractable n/v.    Final Clinical Impression(s) / ED Diagnoses Final diagnoses:  Intractable nausea and vomiting  Acute intractable headache, unspecified headache type    Rx / DC Orders ED Discharge Orders    None       Rayna Sexton, PA-C 08/25/19 Braggs, Delice Bison, DO 08/25/19 [phone]

## 2019-08-25 ENCOUNTER — Observation Stay (HOSPITAL_COMMUNITY): Payer: PRIVATE HEALTH INSURANCE

## 2019-08-25 ENCOUNTER — Other Ambulatory Visit: Payer: Self-pay

## 2019-08-25 ENCOUNTER — Emergency Department (HOSPITAL_COMMUNITY): Payer: PRIVATE HEALTH INSURANCE

## 2019-08-25 ENCOUNTER — Encounter (HOSPITAL_COMMUNITY): Payer: Self-pay | Admitting: Internal Medicine

## 2019-08-25 DIAGNOSIS — K219 Gastro-esophageal reflux disease without esophagitis: Secondary | ICD-10-CM | POA: Diagnosis present

## 2019-08-25 DIAGNOSIS — Z811 Family history of alcohol abuse and dependence: Secondary | ICD-10-CM | POA: Diagnosis not present

## 2019-08-25 DIAGNOSIS — Z6841 Body Mass Index (BMI) 40.0 and over, adult: Secondary | ICD-10-CM

## 2019-08-25 DIAGNOSIS — Z800 Family history of malignant neoplasm of digestive organs: Secondary | ICD-10-CM | POA: Diagnosis not present

## 2019-08-25 DIAGNOSIS — Z96653 Presence of artificial knee joint, bilateral: Secondary | ICD-10-CM | POA: Diagnosis present

## 2019-08-25 DIAGNOSIS — N390 Urinary tract infection, site not specified: Secondary | ICD-10-CM | POA: Diagnosis present

## 2019-08-25 DIAGNOSIS — E860 Dehydration: Secondary | ICD-10-CM | POA: Diagnosis present

## 2019-08-25 DIAGNOSIS — J45909 Unspecified asthma, uncomplicated: Secondary | ICD-10-CM | POA: Diagnosis present

## 2019-08-25 DIAGNOSIS — R519 Headache, unspecified: Secondary | ICD-10-CM | POA: Diagnosis not present

## 2019-08-25 DIAGNOSIS — G44009 Cluster headache syndrome, unspecified, not intractable: Principal | ICD-10-CM

## 2019-08-25 DIAGNOSIS — K5909 Other constipation: Secondary | ICD-10-CM | POA: Diagnosis present

## 2019-08-25 DIAGNOSIS — G4733 Obstructive sleep apnea (adult) (pediatric): Secondary | ICD-10-CM | POA: Diagnosis present

## 2019-08-25 DIAGNOSIS — Z8249 Family history of ischemic heart disease and other diseases of the circulatory system: Secondary | ICD-10-CM | POA: Diagnosis not present

## 2019-08-25 DIAGNOSIS — R112 Nausea with vomiting, unspecified: Secondary | ICD-10-CM | POA: Diagnosis present

## 2019-08-25 DIAGNOSIS — Z20822 Contact with and (suspected) exposure to covid-19: Secondary | ICD-10-CM | POA: Diagnosis present

## 2019-08-25 DIAGNOSIS — F1011 Alcohol abuse, in remission: Secondary | ICD-10-CM | POA: Diagnosis present

## 2019-08-25 DIAGNOSIS — L409 Psoriasis, unspecified: Secondary | ICD-10-CM | POA: Diagnosis present

## 2019-08-25 DIAGNOSIS — I10 Essential (primary) hypertension: Secondary | ICD-10-CM | POA: Diagnosis present

## 2019-08-25 DIAGNOSIS — F419 Anxiety disorder, unspecified: Secondary | ICD-10-CM | POA: Diagnosis present

## 2019-08-25 DIAGNOSIS — G894 Chronic pain syndrome: Secondary | ICD-10-CM | POA: Diagnosis present

## 2019-08-25 DIAGNOSIS — E1142 Type 2 diabetes mellitus with diabetic polyneuropathy: Secondary | ICD-10-CM | POA: Diagnosis present

## 2019-08-25 DIAGNOSIS — M8949 Other hypertrophic osteoarthropathy, multiple sites: Secondary | ICD-10-CM | POA: Diagnosis present

## 2019-08-25 DIAGNOSIS — E11319 Type 2 diabetes mellitus with unspecified diabetic retinopathy without macular edema: Secondary | ICD-10-CM | POA: Diagnosis present

## 2019-08-25 DIAGNOSIS — F329 Major depressive disorder, single episode, unspecified: Secondary | ICD-10-CM | POA: Diagnosis present

## 2019-08-25 LAB — CSF CELL COUNT WITH DIFFERENTIAL
Lymphs, CSF: 85 % — ABNORMAL HIGH (ref 40–80)
Monocyte-Macrophage-Spinal Fluid: 10 % — ABNORMAL LOW (ref 15–45)
RBC Count, CSF: 225 /mm3 — ABNORMAL HIGH
Segmented Neutrophils-CSF: 5 % (ref 0–6)
Tube #: 4
WBC, CSF: 11 /mm3 (ref 0–5)

## 2019-08-25 LAB — URINALYSIS, ROUTINE W REFLEX MICROSCOPIC
Bilirubin Urine: NEGATIVE
Glucose, UA: NEGATIVE mg/dL
Hgb urine dipstick: NEGATIVE
Ketones, ur: NEGATIVE mg/dL
Nitrite: NEGATIVE
Protein, ur: NEGATIVE mg/dL
Specific Gravity, Urine: 1.013 (ref 1.005–1.030)
pH: 7 (ref 5.0–8.0)

## 2019-08-25 LAB — COMPREHENSIVE METABOLIC PANEL
ALT: 28 U/L (ref 0–44)
AST: 24 U/L (ref 15–41)
Albumin: 4.3 g/dL (ref 3.5–5.0)
Alkaline Phosphatase: 143 U/L — ABNORMAL HIGH (ref 38–126)
Anion gap: 9 (ref 5–15)
BUN: 14 mg/dL (ref 6–20)
CO2: 28 mmol/L (ref 22–32)
Calcium: 9.4 mg/dL (ref 8.9–10.3)
Chloride: 104 mmol/L (ref 98–111)
Creatinine, Ser: 0.88 mg/dL (ref 0.44–1.00)
GFR calc Af Amer: >60 mL/min (ref >60)
GFR calc non Af Amer: >60 mL/min (ref >60)
Glucose, Bld: 160 mg/dL — ABNORMAL HIGH (ref 70–99)
Potassium: 3.7 mmol/L (ref 3.5–5.1)
Sodium: 141 mmol/L (ref 135–145)
Total Bilirubin: 0.3 mg/dL (ref 0.3–1.2)
Total Protein: 8.4 g/dL — ABNORMAL HIGH (ref 6.5–8.1)

## 2019-08-25 LAB — I-STAT BETA HCG BLOOD, ED (MC, WL, AP ONLY): I-stat hCG, quantitative: 14.8 m[IU]/mL — ABNORMAL HIGH (ref <5)

## 2019-08-25 LAB — CBG MONITORING, ED
Glucose-Capillary: 269 mg/dL — ABNORMAL HIGH (ref 70–99)
Glucose-Capillary: 238 mg/dL — ABNORMAL HIGH (ref 70–99)
Glucose-Capillary: 255 mg/dL — ABNORMAL HIGH (ref 70–99)

## 2019-08-25 LAB — CBC
HCT: 42.9 % (ref 36.0–46.0)
Hemoglobin: 14 g/dL (ref 12.0–15.0)
MCH: 28.1 pg (ref 26.0–34.0)
MCHC: 32.6 g/dL (ref 30.0–36.0)
MCV: 86 fL (ref 80.0–100.0)
Platelets: 303 10*3/uL (ref 150–400)
RBC: 4.99 MIL/uL (ref 3.87–5.11)
RDW: 12.9 % (ref 11.5–15.5)
WBC: 13.5 10*3/uL — ABNORMAL HIGH (ref 4.0–10.5)
nRBC: 0 % (ref 0.0–0.2)

## 2019-08-25 LAB — GLUCOSE, CSF: Glucose, CSF: 127 mg/dL — ABNORMAL HIGH (ref 40–70)

## 2019-08-25 LAB — POC SARS CORONAVIRUS 2 AG -  ED: SARS Coronavirus 2 Ag: NEGATIVE

## 2019-08-25 LAB — GLUCOSE, CAPILLARY
Glucose-Capillary: 181 mg/dL — ABNORMAL HIGH (ref 70–99)
Glucose-Capillary: 195 mg/dL — ABNORMAL HIGH (ref 70–99)

## 2019-08-25 LAB — SARS CORONAVIRUS 2 (TAT 6-24 HRS): SARS Coronavirus 2: NEGATIVE

## 2019-08-25 LAB — SEDIMENTATION RATE: Sed Rate: 48 mm/hr — ABNORMAL HIGH (ref 0–22)

## 2019-08-25 LAB — HEMOGLOBIN A1C
Hgb A1c MFr Bld: 7.6 % — ABNORMAL HIGH (ref 4.8–5.6)
Mean Plasma Glucose: 171.42 mg/dL

## 2019-08-25 LAB — HCG, QUANTITATIVE, PREGNANCY: hCG, Beta Chain, Quant, S: 6 m[IU]/mL — ABNORMAL HIGH (ref <5)

## 2019-08-25 LAB — TSH: TSH: 1.821 u[IU]/mL (ref 0.350–4.500)

## 2019-08-25 LAB — PROTEIN, CSF: Total  Protein, CSF: 34 mg/dL (ref 15–45)

## 2019-08-25 LAB — C-REACTIVE PROTEIN: CRP: 1.3 mg/dL — ABNORMAL HIGH (ref <1.0)

## 2019-08-25 LAB — LIPASE, BLOOD: Lipase: 19 U/L (ref 11–51)

## 2019-08-25 MED ORDER — INSULIN GLARGINE 100 UNIT/ML ~~LOC~~ SOLN: 10.0000 [IU] | Freq: Every day | SUBCUTANEOUS | Status: DC

## 2019-08-25 MED ORDER — OXYCODONE-ACETAMINOPHEN 5-325 MG PO TABS
2.0000 | ORAL_TABLET | ORAL | Status: DC | PRN
  Administered 2019-08-26: 2 via ORAL
  Filled 2019-08-25: qty 2

## 2019-08-25 MED ORDER — ONDANSETRON HCL 4 MG/2ML IJ SOLN
4.0000 mg | Freq: Four times a day (QID) | INTRAMUSCULAR | Status: DC | PRN
  Administered 2019-08-26 (×2): 4 mg via INTRAVENOUS
  Filled 2019-08-25 (×2): qty 2

## 2019-08-25 MED ORDER — TIZANIDINE HCL 4 MG PO TABS: 4.0000 mg | ORAL_TABLET | Freq: Two times a day (BID) | ORAL | Status: DC | PRN

## 2019-08-25 MED ORDER — PANTOPRAZOLE SODIUM 40 MG IV SOLR
40.0000 mg | INTRAVENOUS | Status: DC
  Administered 2019-08-25: 40 mg via INTRAVENOUS
  Filled 2019-08-25: qty 40

## 2019-08-25 MED ORDER — SODIUM CHLORIDE 0.9% FLUSH: 10.0000 mL | Freq: Two times a day (BID) | INTRAVENOUS | Status: DC

## 2019-08-25 MED ORDER — ACETAMINOPHEN 325 MG PO TABS: 650.0000 mg | ORAL_TABLET | ORAL | Status: DC | PRN

## 2019-08-25 MED ORDER — ACETAMINOPHEN 650 MG RE SUPP: 650.0000 mg | Freq: Four times a day (QID) | RECTAL | Status: DC | PRN

## 2019-08-25 MED ORDER — FLUTICASONE PROPIONATE 50 MCG/ACT NA SUSP
2.0000 | Freq: Every day | NASAL | Status: DC | PRN
  Filled 2019-08-25: qty 16

## 2019-08-25 MED ORDER — INSULIN GLARGINE 100 UNIT/ML ~~LOC~~ SOLN
20.0000 [IU] | Freq: Every day | SUBCUTANEOUS | Status: DC
  Administered 2019-08-25 – 2019-08-26 (×2): 20 [IU] via SUBCUTANEOUS
  Filled 2019-08-25 (×2): qty 0.2

## 2019-08-25 MED ORDER — PANTOPRAZOLE SODIUM 40 MG IV SOLR
40.0000 mg | INTRAVENOUS | Status: DC
  Administered 2019-08-26: 40 mg via INTRAVENOUS
  Filled 2019-08-25: qty 40

## 2019-08-25 MED ORDER — KETOROLAC TROMETHAMINE 15 MG/ML IJ SOLN
30.0000 mg | Freq: Once | INTRAMUSCULAR | Status: AC
  Administered 2019-08-25: 30 mg via INTRAVENOUS
  Filled 2019-08-25: qty 2

## 2019-08-25 MED ORDER — SODIUM CHLORIDE 0.9 % IV BOLUS
500.0000 mL | Freq: Once | INTRAVENOUS | Status: AC
  Administered 2019-08-25: 500 mL via INTRAVENOUS

## 2019-08-25 MED ORDER — ALBUTEROL SULFATE (2.5 MG/3ML) 0.083% IN NEBU: 2.5000 mg | INHALATION_SOLUTION | RESPIRATORY_TRACT | Status: DC | PRN

## 2019-08-25 MED ORDER — ONDANSETRON HCL 4 MG PO TABS: 4.0000 mg | ORAL_TABLET | Freq: Four times a day (QID) | ORAL | Status: DC | PRN

## 2019-08-25 MED ORDER — MAGNESIUM SULFATE 2 GM/50ML IV SOLN
2.0000 g | Freq: Once | INTRAVENOUS | Status: AC
  Administered 2019-08-25: 2 g via INTRAVENOUS
  Filled 2019-08-25: qty 50

## 2019-08-25 MED ORDER — LEVOTHYROXINE SODIUM 75 MCG PO TABS
75.0000 ug | ORAL_TABLET | Freq: Every day | ORAL | Status: DC
  Administered 2019-08-26: 75 ug via ORAL
  Filled 2019-08-25: qty 1

## 2019-08-25 MED ORDER — SERTRALINE HCL 100 MG PO TABS
200.0000 mg | ORAL_TABLET | Freq: Every morning | ORAL | Status: DC
  Administered 2019-08-25 – 2019-08-26 (×2): 200 mg via ORAL
  Filled 2019-08-25: qty 4
  Filled 2019-08-25: qty 2

## 2019-08-25 MED ORDER — CIPROFLOXACIN IN D5W 400 MG/200ML IV SOLN
400.0000 mg | Freq: Two times a day (BID) | INTRAVENOUS | Status: DC
  Administered 2019-08-25 – 2019-08-26 (×3): 400 mg via INTRAVENOUS
  Filled 2019-08-25 (×3): qty 200

## 2019-08-25 MED ORDER — HYDROMORPHONE HCL 1 MG/ML IJ SOLN
1.0000 mg | INTRAMUSCULAR | Status: DC | PRN
  Administered 2019-08-25: 1 mg via INTRAVENOUS
  Filled 2019-08-25: qty 1

## 2019-08-25 MED ORDER — ALBUTEROL SULFATE HFA 108 (90 BASE) MCG/ACT IN AERS
2.0000 | INHALATION_SPRAY | RESPIRATORY_TRACT | Status: DC | PRN
  Filled 2019-08-25: qty 6.7

## 2019-08-25 MED ORDER — SENNA 8.6 MG PO TABS
1.0000 | ORAL_TABLET | Freq: Every day | ORAL | Status: DC
  Administered 2019-08-25 – 2019-08-26 (×2): 8.6 mg via ORAL
  Filled 2019-08-25 (×2): qty 1

## 2019-08-25 MED ORDER — PROMETHAZINE HCL 25 MG/ML IJ SOLN
12.5000 mg | Freq: Once | INTRAMUSCULAR | Status: AC
  Administered 2019-08-25: 12.5 mg via INTRAVENOUS
  Filled 2019-08-25: qty 1

## 2019-08-25 MED ORDER — LORAZEPAM 2 MG/ML IJ SOLN: 1.0000 mg | Freq: Once | INTRAMUSCULAR | Status: DC

## 2019-08-25 MED ORDER — DEXAMETHASONE SODIUM PHOSPHATE 10 MG/ML IJ SOLN
10.0000 mg | Freq: Once | INTRAMUSCULAR | Status: AC
  Administered 2019-08-25: 10 mg via INTRAVENOUS
  Filled 2019-08-25: qty 1

## 2019-08-25 MED ORDER — LORAZEPAM 1 MG PO TABS: 2.0000 mg | ORAL_TABLET | Freq: Two times a day (BID) | ORAL | Status: DC | PRN

## 2019-08-25 MED ORDER — SODIUM CHLORIDE 0.9 % IV SOLN: INTRAVENOUS | Status: DC

## 2019-08-25 MED ORDER — LORAZEPAM 2 MG/ML IJ SOLN
INTRAMUSCULAR | Status: AC
  Filled 2019-08-25: qty 1

## 2019-08-25 MED ORDER — POTASSIUM CHLORIDE IN NACL 20-0.9 MEQ/L-% IV SOLN: INTRAVENOUS | Status: DC

## 2019-08-25 MED ORDER — LEVOTHYROXINE SODIUM 75 MCG PO TABS
75.0000 ug | ORAL_TABLET | Freq: Every day | ORAL | Status: DC
  Filled 2019-08-25: qty 1

## 2019-08-25 MED ORDER — DIPHENHYDRAMINE HCL 50 MG/ML IJ SOLN
12.5000 mg | Freq: Once | INTRAMUSCULAR | Status: AC
  Administered 2019-08-25: 12.5 mg via INTRAVENOUS
  Filled 2019-08-25: qty 1

## 2019-08-25 MED ORDER — ZOLPIDEM TARTRATE 10 MG PO TABS: 10.0000 mg | ORAL_TABLET | Freq: Every evening | ORAL | Status: DC | PRN

## 2019-08-25 MED ORDER — ZOLPIDEM TARTRATE 5 MG PO TABS
5.0000 mg | ORAL_TABLET | Freq: Every evening | ORAL | Status: DC | PRN
  Administered 2019-08-26: 5 mg via ORAL
  Filled 2019-08-25: qty 1

## 2019-08-25 MED ORDER — INSULIN ASPART 100 UNIT/ML ~~LOC~~ SOLN
0.0000 [IU] | Freq: Three times a day (TID) | SUBCUTANEOUS | Status: DC
  Administered 2019-08-25 (×2): 3 [IU] via SUBCUTANEOUS
  Administered 2019-08-25 (×2): 8 [IU] via SUBCUTANEOUS
  Administered 2019-08-26 (×2): 3 [IU] via SUBCUTANEOUS
  Filled 2019-08-25: qty 0.15

## 2019-08-25 MED ORDER — ONDANSETRON HCL 4 MG/2ML IJ SOLN
4.0000 mg | Freq: Once | INTRAMUSCULAR | Status: AC
  Administered 2019-08-25: 4 mg via INTRAVENOUS
  Filled 2019-08-25: qty 2

## 2019-08-25 MED ORDER — ENOXAPARIN SODIUM 60 MG/0.6ML ~~LOC~~ SOLN
55.0000 mg | SUBCUTANEOUS | Status: DC
  Filled 2019-08-25: qty 0.55

## 2019-08-25 MED ORDER — ENALAPRILAT 1.25 MG/ML IV SOLN
1.2500 mg | Freq: Four times a day (QID) | INTRAVENOUS | Status: DC | PRN
  Filled 2019-08-25: qty 1

## 2019-08-25 MED ORDER — SODIUM CHLORIDE (PF) 0.9 % IJ SOLN
INTRAMUSCULAR | Status: AC
  Administered 2019-08-26: 10 mL
  Filled 2019-08-25: qty 50

## 2019-08-25 MED ORDER — METOCLOPRAMIDE HCL 5 MG/ML IJ SOLN
10.0000 mg | Freq: Once | INTRAMUSCULAR | Status: AC
  Administered 2019-08-25: 10 mg via INTRAVENOUS
  Filled 2019-08-25: qty 2

## 2019-08-25 MED ORDER — IOHEXOL 300 MG/ML  SOLN
100.0000 mL | Freq: Once | INTRAMUSCULAR | Status: AC | PRN
  Administered 2019-08-25: 100 mL via INTRAVENOUS

## 2019-08-25 MED ORDER — SODIUM CHLORIDE 0.9% FLUSH
10.0000 mL | INTRAVENOUS | Status: DC | PRN
  Administered 2019-08-26: 10 mL

## 2019-08-25 MED ORDER — POLYETHYLENE GLYCOL 3350 17 G PO PACK: 17.0000 g | PACK | Freq: Every day | ORAL | Status: DC | PRN

## 2019-08-25 NOTE — ED Notes (Signed)
Pt returns from MRI and CT. A/Ox3. Skin w/d/pink. Resp wnl, equal and non-labored. Speech clear. Denies HA at this time. Awaits admission room. Pt aware. NAD. No complaints voiced. Cont to monitor.

## 2019-08-25 NOTE — Procedures (Addendum)
PROCEDURE: Informed consent was obtained from the patient prior to the procedure, including potential complications of headache, allergy, and pain. With the patient prone, the lower back was prepped with Betadine. 1% Lidocaine was used for local anesthesia. Lumbar puncture was performed at the [L4-L5 level using a [22] gauge needle with return of [clear] CSF with an opening pressure of [15] cm water. Closing pressure equal 12 cm water.  [Six] ml of CSF were obtained for laboratory studies. The patient tolerated the procedure well and there were no apparent complications.  IMPRESSION: [ 1. Successful lumbar puncture.  2. Normal opening and closing pressure.  3. Only 6 cc of CSF collected as pressure was low.]

## 2019-08-25 NOTE — ED Notes (Signed)
Lab called to add urine culture. 

## 2019-08-25 NOTE — Consult Note (Addendum)
NEURO HOSPITALIST CONSULT NOTE   Requesting physician: Dr. Tyrell Antonio  Reason for Consult:HA  History obtained from:  Patient    HPI:                                                                                                                                          Alexa Woods is an 59 y.o. female  With PMH HTN, DM2, diabetic neuropathy and retinopathy, GERD who presented to [facility] ED with c/o HA and vomiting.  Patient is c/o a Lamonte Sakai that started 2 days ago. She describes it as a dull constant ache that radiates down the ride side of her head to the back of her skull. Also includes pressure behind the right eye. She began to vomit thursday night and It continued through Friday morning. Patient has chronic blurred vision and she states it did not get any worse with the   Denies h/o migraines, vision problems, weakness, ETOH, Drugs use, smoking.  Currently reports no headache.  Received opiates, Decadron, Phenergan, Zofran and Reglan for her headache management since admission  CTH: Findings which could be suggestive of age indeterminate lacunar infarct or chronic small vessel ischemia involving the left frontal lobe. MRI: no acute infarct; ventricular prominence - ?communicating hydrocephalus BG: 255  Past Medical History:  Diagnosis Date  . Alcohol abuse, in remission    since 1998  . Allergic rhinitis   . Anxiety   . Asthma   . Bilateral lower extremity edema   . Bulging lumbar disc   . Bulging of cervical intervertebral disc   . Chronic constipation   . DDD (degenerative disc disease), lumbosacral   . Depression   . Dyspnea   . GERD (gastroesophageal reflux disease)   . Hemorrhoids   . History of Bell's palsy 08/2007   left  . History of chronic cystitis   . IBS (irritable bowel syndrome)   . Insulin dependent type 2 diabetes mellitus [facility])    endocrinologist-- dr Cruzita Lederer  . Lower urinary tract symptoms (LUTS)   . OA (osteoarthritis)    knees, back,  hands, elbows  . OSA (obstructive sleep apnea)    per study 06-08-2017 mild osa , cpap recommended , per pt insurance issue  . Peripheral neuropathy   . PONV (postoperative nausea and vomiting)   . Psoriasis   . S/P dilatation of esophageal stricture   . Unspecified essential hypertension   . Wears partial dentures    lower    Past Surgical History:  Procedure Laterality Date  . CHOLECYSTECTOMY OPEN  1990   AND APPENDECTOMY  . DOBUTAMINE STRESS ECHO  2009    normal stress echo, no evidence of ischemia  . ELBOW SURGERY Bilateral RIGHT 2013;  LEFT 2016   for nerve damage  .  ESOPHAGOGASTRODUODENOSCOPY  07/2002   erythematous gastropathy  . KNEE ARTHROSCOPY Left 11/ 2018   dr Wynelle Link  @ [facility]  . KNEE ARTHROSCOPY W/ PARTIAL MEDIAL MENISCECTOMY Right 10/2008   and chondroplasty  . SHOULDER ARTHROSCOPY WITH DISTAL CLAVICLE RESECTION Left 12/ 2011   dr dean  . TOTAL KNEE ARTHROPLASTY Right 07-01-2009  dr Wynelle Link   [facility] [place]  . TOTAL KNEE ARTHROPLASTY Left 12/06/2017   Procedure: LEFT TOTAL LEFT KNEE ARTHROPLASTY;  Surgeon: Gaynelle Arabian, MD;  Location: [facility];  Service: Orthopedics;  Laterality: Left;  Marland Kitchen VAGINAL HYSTERECTOMY  2003    Family History  Problem Relation Age of Onset  . Alcohol abuse Mother   . Hypertension Mother   . Esophageal cancer Maternal Grandmother   . Lung cancer Other        mat great uncle  . Colon cancer Neg Hx      Social History:  reports that she quit smoking about 17 years ago. Her smoking use included cigarettes. She quit after 25.00 years of use. She has never used smokeless tobacco. She reports that she does not drink alcohol or use drugs.  Allergies  Allergen Reactions  . Bupropion Hcl Other (See Comments)    Pt is unsure  . Cefuroxime Axetil Nausea Only  . Darvon [Propoxyphene]     wheezing  . Gabapentin     Pt does not remember reaction   . Lidocaine     REACTION: unknown  . Metformin     REACTION: GI  . Paroxetine     REACTION: doesn't agree   . Tramadol Hcl     REACTION: Causes Anxiety  . Codeine Nausea And Vomiting and Rash  . Sulfa Antibiotics Rash    MEDICATIONS:                                                                                                                     Current Facility-Administered Medications  Medication Dose Route Frequency Provider Last Rate Last Admin  . 0.9 %  sodium chloride infusion   Intravenous Continuous Regalado, Belkys A, MD 100 mL/hr at 08/25/19 1615 Rate Change at 08/25/19 1615  . acetaminophen (TYLENOL) tablet 650 mg  650 mg Oral Q4H PRN Shalhoub, Sherryll Burger, MD       Or  . acetaminophen (TYLENOL) suppository 650 mg  650 mg Rectal Q6H PRN Shalhoub, Sherryll Burger, MD      . albuterol (VENTOLIN HFA) 108 (90 Base) MCG/ACT inhaler 2 puff  2 puff Inhalation Q4H PRN Shalhoub, Sherryll Burger, MD      . ciprofloxacin (CIPRO) IVPB 400 mg  400 mg Intravenous Q12H Shalhoub, Sherryll Burger, MD   Stopped at 08/25/19 [phone]  . enalaprilat (VASOTEC) injection 1.25 mg  1.25 mg Intravenous Q6H PRN Shalhoub, Sherryll Burger, MD      . fluticasone (FLONASE) 50 MCG/ACT nasal spray 2 spray  2 spray Each Nare Daily PRN Shalhoub, Sherryll Burger, MD      . HYDROmorphone (DILAUDID) injection 1 mg  1 mg Intravenous Q4H PRN Shalhoub, Sherryll Burger, MD       Or  . oxyCODONE-acetaminophen (PERCOCET/ROXICET) 5-325 MG per tablet 2 tablet  2 tablet Oral Q4H PRN Shalhoub, Sherryll Burger, MD      . insulin aspart (novoLOG) injection 0-15 Units  0-15 Units Subcutaneous TID AC & HS Shalhoub, Sherryll Burger, MD   8 Units at 08/25/19 1350  . insulin glargine (LANTUS) injection 20 Units  20 Units Subcutaneous Daily Shalhoub, Sherryll Burger, MD   20 Units at 08/25/19 1103  . levothyroxine (SYNTHROID) tablet 75 mcg  75 mcg Oral Daily Shalhoub, Sherryll Burger, MD      . LORazepam (ATIVAN) 2 MG/ML injection           . LORazepam (ATIVAN) injection 1 mg  1 mg Intravenous Once Shalhoub, Sherryll Burger, MD      . LORazepam (ATIVAN) tablet 2 mg  2 mg Oral BID PRN Vernelle Emerald, MD      .  ondansetron [facility]) tablet 4 mg  4 mg Oral Q6H PRN Shalhoub, Sherryll Burger, MD       Or  . ondansetron [facility]) injection 4 mg  4 mg Intravenous Q6H PRN Shalhoub, Sherryll Burger, MD      . pantoprazole (PROTONIX) injection 40 mg  40 mg Intravenous Q24H Vernelle Emerald, MD   40 mg at 08/25/19 0804  . polyethylene glycol (MIRALAX / GLYCOLAX) packet 17 g  17 g Oral Daily PRN Shalhoub, Sherryll Burger, MD      . senna (SENOKOT) tablet 8.6 mg  1 tablet Oral Daily Shalhoub, Sherryll Burger, MD   8.6 mg at 08/25/19 1104  . sertraline (ZOLOFT) tablet 200 mg  200 mg Oral q morning - 10a Shalhoub, Sherryll Burger, MD   200 mg at 08/25/19 1104  . sodium chloride (PF) 0.9 % injection           . sodium chloride flush (NS) 0.9 % injection 3 mL  3 mL Intravenous Once Shalhoub, Sherryll Burger, MD      . tiZANidine (ZANAFLEX) tablet 4 mg  4 mg Oral BID PRN Shalhoub, Sherryll Burger, MD      . zolpidem (AMBIEN) tablet 10 mg  10 mg Oral QHS PRN Shalhoub, Sherryll Burger, MD          ROS:                                                                                                                                       ROS was performed and is negative except as noted in HPI    Blood pressure (!) 157/89, pulse 63, temperature 98.2 F (36.8 C), temperature source Oral, resp. rate 11, height 5\' 6"  (1.676 m), weight 113.4 kg, SpO2 97 %.   General Examination:  Physical Exam  Constitutional: Appears well-developed and well-nourished.  Psych: Affect appropriate to situation Eyes: Normal external eye and conjunctiva. HENT: Normocephalic, no lesions, without obvious abnormality.   Musculoskeletal-no joint tenderness, deformity or swelling Cardiovascular: Normal rate and regular rhythm.  Respiratory: Effort normal, non-labored breathing saturations WNL GI: Soft.  No distension. There is no tenderness.  Skin: WDI  Neurological Examination Mental  Status: Alert, oriented, thought content appropriate.  Speech fluent without evidence of aphasia.  Able to follow  commands without difficulty. Cranial Nerves: [id number] fields grossly normal,  III,IV, VI: ptosis not present, extra-ocular motions intact bilaterally pupils equal, round, reactive to light and accommodation V,VII: smile symmetric, facial light touch sensation normal bilaterally VIII: hearing normal bilaterally IX,X: uvula rises symmetrically XI: bilateral shoulder shrug XII: midline tongue extension Motor: Right : Upper extremity   5/5  Left:     Upper extremity   5/5  Lower extremity   5/5   Lower extremity   5/5 Tone and bulk:normal tone throughout; no atrophy noted Sensory:  light touch intact throughout, bilaterally Cerebellar: Normal FNF Gait: deferred   Lab Results: Basic Metabolic Panel: Recent Labs  Lab 08/25/19 0111  NA 141  K 3.7  CL 104  CO2 28  GLUCOSE 160*  BUN 14  CREATININE 0.88  CALCIUM 9.4    CBC: Recent Labs  Lab 08/25/19 0111  WBC 13.5*  HGB 14.0  HCT 42.9  MCV 86.0  PLT 303    Imaging: CT Head Wo Contrast  Result Date: 08/25/2019 CLINICAL DATA:  Headache EXAM: CT HEAD WITHOUT CONTRAST TECHNIQUE: Contiguous axial images were obtained from the base of the skull through the vertex without intravenous contrast. COMPARISON:  May 22 2009 FINDINGS: Brain: No evidence of acute territorial infarction, hemorrhage, hydrocephalus,extra-axial collection or mass lesion/mass effect. There is dilatation the ventricles and sulci consistent with age-related atrophy. There is a subtle area of periventricular white matter change seen adjacent to the left frontal horn, series 2, image 18. Vascular: No hyperdense vessel or unexpected calcification. Skull: The skull is intact. No fracture or focal lesion identified. Sinuses/Orbits: The visualized paranasal sinuses and mastoid air cells are clear. The orbits and globes intact. Other: None IMPRESSION:  Findings which could be suggestive of age indeterminate lacunar infarct or chronic small vessel ischemia involving the left frontal lobe. Findings consistent with age related atrophy Electronically Signed   By: Prudencio Pair M.D.   On: 08/25/2019 01:38   MR BRAIN WO CONTRAST  Result Date: 08/25/2019 CLINICAL DATA:  Headache, no history of headache; headache, acute, normal neuro exam. Additional history provided: Patient with history of obesity, hypertension, diabetes mellitus type 2, gastroesophageal reflux disease and chronic pain syndrome who presents to the emergency department with headache and vomiting. EXAM: MRI HEAD WITHOUT CONTRAST TECHNIQUE: Multiplanar, multiecho pulse sequences of the brain and surrounding structures were obtained without intravenous contrast. COMPARISON:  Head CT 08/25/2019, head CT 05/22/2009 FINDINGS: Brain: There is no evidence of acute infarct. No evidence of intracranial mass. No midline shift or extra-axial fluid collection. No chronic intracranial blood products. Mild scattered T2/FLAIR hyperintensity within the cerebral white matter is advanced for age. Ventricular prominence which has noticeably progressed as compared to prior head CT 05/22/2009. Cerebral volume is normal for age. Vascular: Flow voids maintained within the proximal large arterial vessels. Skull and upper cervical spine: No focal marrow lesion. Sinuses/Orbits: Visualized orbits demonstrate no acute abnormality. Trace ethmoid sinus mucosal thickening. No significant mastoid effusion. IMPRESSION: Ventricular prominence which has noticeably  progressed as compared to head CT 05/22/2009 and is suspicious for communicating hydrocephalus. Age advanced scattered T2 hyperintense signal changes within the cerebral white matter. Findings are nonspecific, but likely reflect sequela of chronic small vessel ischemic disease given the provided history of hypertension and diabetes. No evidence of acute infarction.  Electronically Signed   By: Kellie Simmering DO   On: 08/25/2019 07:23   CT ABDOMEN PELVIS W CONTRAST  Result Date: 08/25/2019 CLINICAL DATA:  Acute nonlocalized abdominal pain EXAM: CT ABDOMEN AND PELVIS WITH CONTRAST TECHNIQUE: Multidetector CT imaging of the abdomen and pelvis was performed using the standard protocol following bolus administration of intravenous contrast. CONTRAST:  161mL OMNIPAQUE IOHEXOL 300 MG/ML  SOLN COMPARISON:  Renal stone CT 06/05/2016 FINDINGS: Lower chest:  No contributory findings. Hepatobiliary: No focal liver abnormality.Cholecystectomy. No bile duct dilatation. Pancreas: Unremarkable. Spleen: Unremarkable. Adrenals/Urinary Tract: Negative adrenals. No hydronephrosis or stone. Bilateral upper pole renal cyst. Unremarkable bladder. Stomach/Bowel:  No obstruction. No appendicitis. Vascular/Lymphatic: No acute vascular abnormality. No mass or adenopathy. Reproductive:Hysterectomy and probable pelvic floor laxity. Other: No ascites or pneumoperitoneum. Musculoskeletal: No acute abnormalities. 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 IMPRESSION: No acute finding or explanation for abdominal pain. Electronically Signed   By: Monte Fantasia M.D.   On: 08/25/2019 07:15   Attending addendum Patient seen and examined Reports sudden onset of headache-pain behind the right eye, followed by nausea and vomiting for 1 to 2 days. CT head with concern for infarct.  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 Patient underwent a spinal tap-opening pressure was normal at 15 cm of water and closing pressure after 6 cc drainage of CSF was 12 cm of water. She reports some improvement in headache but the improvement had happened even before the spinal tap was performed. Does not report any gait ataxia, gait disturbance, urinary incontinence or cognitive deficits that have developed over  time. I have reviewed imaging personally. CT head from 2010-had some ventricular prominence especially in the third ventricle already.   MRI below from today-08/25/2019-expanded ventricular prominence.  On examination, she is awake alert oriented x3, speech is fluent and nondysarthric, no evidence of aphasia, cranial nerves II to XII intact, no motor deficits-left upper extremity is weaker due to left shoulder fracture since August, intact sensation, no dysmetria.  Gait testing was deferred at this time  CSF showed clear fluid, glucose 127, RBC 225, WBC 11, predominantly lymphocytic, normal protein.  Increased glucose at 127.  Assessment: 59 year old female with PMH obesity, DM2, diabetic neuropathy, retinopathy who presented to [facility] ED with c/o HA and Nausea/ vomiting. MR imaging was performed for suspicion for stroke on initial CT, but did not reveal any stroke instead revealed enlarged ventricles, progressed from 2010 concerning for communicating hydrocephalus. The patient symptoms are not consistent with that of normal pressure hydrocephalus-of note normal pressure hydrocephalus does not present with headache nausea vomiting-i.e. symptoms of increased intercranial pressure.  And her spinal tap proved that she did not have increased intracranial pressure. She does not have any symptoms consistent with NPH the classic triad -gait disturbance, cognitive deficits or urinary incontinence. At this time I am not very clear as to what her current diagnosis is-imaging is suggestive of NPH due to ventricular prominence as well as ependymal flow, but she denies presenting with the triad (gait difficulty/memory disturbance/urinary incontinence ) that is classic for NPH. CSF analysis does not show  elevated protein but does show 11 WBCs with only 225 red blood cells, which is slightly abnormal and could raise suspicion for aseptic meningitis due to lymphocytic predominance. MRI is not consistent with an HSV  encephalitis type of picture and neither has her presentation. Another consideration is aseptic meningitis - can explain the headaches, nausea, vomiting and can also cause NPH like picture.  Impression -CSF with suggestion of mildly increased cells with normal protein - question aseptic meningitis, which would explain headache nausea vomiting etc.  Other considerations are increased intracranial pressure-but spinal tap done reveals an opening pressure of 15 not consistent with increased pressure. 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  Recommendations: -curbside neurosurgery to see if they have any recs - don't see a need for emergent consult. -Symptomatic headache management -Do not recommend any antivirals as clinical and imaging picture not consistent with HSV encephalitis (will check HSV PCR in CSF to be sure though) -I do agree with Dr. Sherryl Manges to get an eye exam to look for any evidence of papilledema. -f/u with OP neurology for headaches if they persist.  -- Amie Portland, MD [facility] Pager: [phone] If 7pm to 7am, please call on call as listed on AMION.

## 2019-08-25 NOTE — ED Notes (Signed)
Pt ambulatory to restroom without concerns. Pt has steady and even gate.

## 2019-08-25 NOTE — H&P (Addendum)
History and Physical    Alexa Woods UMP:[id number] DOB: [birth year] DOA: 08/24/2019  PCP: Abner Greenspan, MD  Patient coming from: home   Chief Complaint:  Chief Complaint  Patient presents with  . Emesis  . Headache     HPI:    59 year old female with past medical history of obesity, hypertension, diabetes mellitus type 2, gastroesophageal reflux disease and chronic pain syndrome who presents to [facility] emergency department with complaints of headache and vomiting.  Patient explains that approximately 2 days ago she began to develop a headache.  This headache started while she was lying down and was not associated with any level of activity.  Headache was initially mild in intensity but progressively worsened over the next several days.  Headache became severe in intensity.  Patient describes the headache as dull in quality, located behind the right eye and radiating to the occipital region.  Headache seems to be worsened by bright lights.  Patient denies any neck stiffness, fevers, sick contacts.   Patient states that she has chronic slight blurriness of her vision which has not changed as of late.  Patient does complain of intermittent "floating black spots in my right eye" has occurred occasionally since the onset of her symptoms.  Patient denies any focal weakness, slurring of speech, confusion or loss of balance.  Patient symptoms of severe headache continued to persist until approximately 24 hours ago she began to develop associated nausea and vomiting.  Patient states that she has been having frequent bouts of nonbilious nonbloody vomitus since.  Patient is now also complaining of associated epigastric pain, mild to moderate in intensity, nonradiating and sharp in quality.  Patient also complains of associated poor oral intake over the span of time.  Patient is also complaining of dysuria but denies hematuria or vaginal discharge.  Patient symptoms continue to  persist until she presented to [facility] emergency department for evaluation.  Upon evaluation in the emergency room patient was found to have a persisting severe headache with several bouts of nonbilious nonbloody vomiting.  Patient was provided with intravenous Reglan, intravenous Benadryl, intravenous steroids, intravenous Zofran and intravenous Toradol all in an attempt to achieve improvement in her symptoms.  Due to patient's intractable symptoms the hospitalist group has been called to assess the patient for admission to the hospital.   Review of Systems: A 10-system review of systems has been performed and all systems are negative with the exception of what is listed in the HPI.    Past Medical History:  Diagnosis Date  . Alcohol abuse, in remission    since 1998  . Allergic rhinitis   . Anxiety   . Asthma   . Bilateral lower extremity edema   . Bulging lumbar disc   . Bulging of cervical intervertebral disc   . Chronic constipation   . DDD (degenerative disc disease), lumbosacral   . Depression   . Dyspnea   . GERD (gastroesophageal reflux disease)   . Hemorrhoids   . History of Bell's palsy 08/2007   left  . History of chronic cystitis   . IBS (irritable bowel syndrome)   . Insulin dependent type 2 diabetes mellitus [facility])    endocrinologist-- dr Cruzita Lederer  . Lower urinary tract symptoms (LUTS)   . OA (osteoarthritis)    knees, back, hands, elbows  . OSA (obstructive sleep apnea)    per study 06-08-2017 mild osa , cpap recommended , per pt insurance issue  . Peripheral  neuropathy   . PONV (postoperative nausea and vomiting)   . Psoriasis   . S/P dilatation of esophageal stricture   . Unspecified essential hypertension   . Wears partial dentures    lower    Past Surgical History:  Procedure Laterality Date  . CHOLECYSTECTOMY OPEN  1990   AND APPENDECTOMY  . DOBUTAMINE STRESS ECHO  2009    normal stress echo, no evidence of ischemia  . ELBOW SURGERY  Bilateral RIGHT 2013;  LEFT 2016   for nerve damage  . ESOPHAGOGASTRODUODENOSCOPY  07/2002   erythematous gastropathy  . KNEE ARTHROSCOPY Left 11/ 2018   dr Wynelle Link  @ [facility]  . KNEE ARTHROSCOPY W/ PARTIAL MEDIAL MENISCECTOMY Right 10/2008   and chondroplasty  . SHOULDER ARTHROSCOPY WITH DISTAL CLAVICLE RESECTION Left 12/ 2011   dr dean  . TOTAL KNEE ARTHROPLASTY Right 07-01-2009  dr Wynelle Link   [facility]  . TOTAL KNEE ARTHROPLASTY Left 12/06/2017   Procedure: LEFT TOTAL LEFT KNEE ARTHROPLASTY;  Surgeon: Gaynelle Arabian, MD;  Location: [facility] ORS;  Service: Orthopedics;  Laterality: Left;  Marland Kitchen VAGINAL HYSTERECTOMY  2003     reports that she quit smoking about 17 years ago. Her smoking use included cigarettes. She quit after 25.00 years of use. She has never used smokeless tobacco. She reports that she does not drink alcohol or use drugs.  Allergies  Allergen Reactions  . Bupropion Hcl Other (See Comments)    Pt is unsure  . Cefuroxime Axetil Nausea Only  . Darvon [Propoxyphene]     wheezing  . Gabapentin     Pt does not remember reaction   . Lidocaine     REACTION: unknown  . Metformin     REACTION: GI  . Paroxetine     REACTION: doesn't agree  . Tramadol Hcl     REACTION: Causes Anxiety  . Codeine Nausea And Vomiting and Rash  . Sulfa Antibiotics Rash    Family History  Problem Relation Age of Onset  . Alcohol abuse Mother   . Hypertension Mother   . Esophageal cancer Maternal Grandmother   . Lung cancer Other        mat great uncle  . Colon cancer Neg Hx      Prior to Admission medications   Medication Sig Start Date End Date Taking? Authorizing Provider  albuterol (VENTOLIN HFA) 108 (90 Base) MCG/ACT inhaler Inhale 2 puffs into the lungs every 4 (four) hours as needed for wheezing or shortness of breath. 02/21/19  Yes Tower, Wynelle Fanny, MD  BD INSULIN SYRINGE U/F 31G X 5/16" 0.5 ML MISC USE THREE TIMES PER DAY WITH R INSULIN & ONCE DAILY WITH LEVEMIR 06/19/19  Yes [name], MD    docusate sodium (COLACE) 100 MG capsule Take 100 mg by mouth every morning.    Yes [provider]  fluticasone (FLONASE) 50 MCG/ACT nasal spray Place 2 sprays into both nostrils daily as needed for allergies or rhinitis. 02/21/19  Yes Tower, Marne A, MD  glucose blood (ONE TOUCH ULTRA TEST) test strip Use to test blood sugar 4 times daily as instructed. 01/25/18  Yes [name], MD  hydrochlorothiazide (HYDRODIURIL) 25 MG tablet TAKE 1 TABLET BY MOUTH EVERY DAY Patient taking differently: Take 25 mg by mouth daily.  05/20/19  Yes Tower, Wynelle Fanny, MD  insulin glargine (LANTUS) 100 UNIT/ML injection Inject 0.35 mLs (35 Units total) into the skin daily. Patient taking differently: Inject 30 Units into the skin daily.  07/28/19  Yes [name], MD  insulin regular (HUMULIN R) 100 units/mL injection INJECT 0.10-0.14 ML'S  INTO THE SKIN THREE TIMES A DAY BEFORE MEALS. Patient taking differently: Inject 10-15 Units into the skin 3 (three) times daily before meals. INJECT 0.10-0.14 ML'S  INTO THE SKIN THREE TIMES A DAY BEFORE MEALS Per sliding scale 07/28/19  Yes [name], MD  KLOR-CON M10 10 MEQ tablet TAKE 1 TABLET BY MOUTH EVERY DAY Patient taking differently: Take 10 mEq by mouth daily.  05/16/19  Yes Tower, Wynelle Fanny, MD  Lancets [facility] ULTR[facility]) lancets Use to test blood sugar 4 times daily as instructed. 10/04/18  Yes [name], MD  levothyroxine (SYNTHROID) 75 MCG tablet Take 1 tablet (75 mcg total) by mouth daily. 07/05/19  Yes Tower, Wynelle Fanny, MD  LORazepam (ATIVAN) 1 MG tablet Take 2 mg by mouth 2 (two) times daily as needed for anxiety or sleep.    Yes [provider]  oxyCODONE-acetaminophen (PERCOCET) 10-325 MG tablet Take 1 tablet by mouth every 4 (four) hours as needed for pain. Hold for SBP < = 105 12/15/17  Yes Medina-Vargas, Monina C, NP  pantoprazole (PROTONIX) 40 MG tablet TAKE 1 TABLET BY MOUTH EVERY DAY Patient taking differently: Take 40 mg  by mouth daily.  04/04/19  Yes Tower, Wynelle Fanny, MD  polyethylene glycol (MIRALAX / GLYCOLAX) packet Take 17 g by mouth daily as needed for mild constipation or moderate constipation.    Yes [provider]  senna (SENOKOT) 8.6 MG TABS tablet Take 1 tablet by mouth daily.    Yes [provider]  sertraline (ZOLOFT) 100 MG tablet Take 200 mg by mouth every morning.    Yes [provider]  tiZANidine (ZANAFLEX) 4 MG tablet Take 4 mg by mouth 2 (two) times daily as needed for muscle spasms.  07/10/14  Yes [provider]  triamcinolone cream (KENALOG) 0.1 % Apply 1 application topically 2 (two) times daily. To psoriasis areas 08/22/18  Yes Tower, Wynelle Fanny, MD  UNABLE TO FIND Diet type : NCS   Yes [provider]  zolpidem (AMBIEN) 10 MG tablet Take 10 mg by mouth at bedtime as needed for sleep.    Yes [provider]  beclomethasone (QVAR) 80 MCG/ACT inhaler Inhale 2 puffs 2 (two) times daily into the lungs. Rinse mouth after use. Patient not taking: Reported on 08/25/2019 04/08/17   Laverle Hobby, MD    Physical Exam: Vitals:   08/25/19 0400 08/25/19 0415 08/25/19 0430 08/25/19 0500  BP: (!) 146/95 (!) 170/99 (!) 174/91 (!) 170/82  Pulse: 79 78 66 76  Resp:   16 16  Temp:      TempSrc:      SpO2: 96% 96% 97% 95%  Weight:      Height:        General: Patient is awake alert and oriented x3.  Patient is in no acute distress to headache. Skin: no rashes, no lesions, slightly poor skin turgor noted. Eyes: Pupils are equally reactive to light.  No evidence of scleral icterus or conjunctival pallor. Opthalmologic exam reveals no obvious retinal hemorrhage. ENMT: Slightly dry mucous membranes.  Posterior pharynx clear of any exudate or lesions. Normal dentition.   Neck: normal, supple, no masses, no thyromegaly Respiratory: clear to auscultation bilaterally, no wheezing no org, no crackles. Normal respiratory effort. No accessory muscle use.   Cardiovascular: Regular rate and rhythm, no murmurs / rubs / gallops. No extremity edema. 2+ pedal pulses. No carotid  bruits.  Back:   Nontender without crepitus or deformity. Abdomen: Notable epigastric tenderness.  Abdomen is soft however.  No evidence of intra-abdominal masses.  Positive bowel sounds noted in all quadrants.   Musculoskeletal: No joint deformity upper and lower extremities. Good ROM, no contractures. Normal muscle tone.  Neurologic: CN 2-12 grossly intact. Sensation intact, strength noted to be 5 out of 5 in all 4 extremities.  Patient is following all commands.  Patient is responsive to verbal stimuli.   Psychiatric: Patient presents as a depressed mood with flat affect.  Patient seems to possess insight as to theircurrent situation.     Labs on Admission: I have personally reviewed following labs and imaging studies -   CBC: Recent Labs  Lab 08/25/19 0111  WBC 13.5*  HGB 14.0  HCT 42.9  MCV 86.0  PLT 585   Basic Metabolic Panel: Recent Labs  Lab 08/25/19 0111  NA 141  K 3.7  CL 104  CO2 28  GLUCOSE 160*  BUN 14  CREATININE 0.88  CALCIUM 9.4   GFR: Estimated Creatinine Clearance: 91.2 mL/min (by C-G formula based on SCr of 0.88 mg/dL). Liver Function Tests: Recent Labs  Lab 08/25/19 0111  AST 24  ALT 28  ALKPHOS 143*  BILITOT 0.3  PROT 8.4*  ALBUMIN 4.3   Recent Labs  Lab 08/25/19 0111  LIPASE 19   No results for input(s): AMMONIA in the last 168 hours. Coagulation Profile: No results for input(s): INR, PROTIME in the last 168 hours. Cardiac Enzymes: No results for input(s): CKTOTAL, CKMB, CKMBINDEX, TROPONINI in the last 168 hours. BNP (last 3 results) No results for input(s): PROBNP in the last 8760 hours. HbA1C: No results for input(s): HGBA1C in the last 72 hours. CBG: No results for input(s): GLUCAP in the last 168 hours. Lipid Profile: No results for input(s): CHOL, HDL, LDLCALC, TRIG, CHOLHDL, LDLDIRECT in the last 72  hours. Thyroid Function Tests: No results for input(s): TSH, T4TOTAL, FREET4, T3FREE, THYROIDAB in the last 72 hours. Anemia Panel: No results for input(s): VITAMINB12, FOLATE, FERRITIN, TIBC, IRON, RETICCTPCT in the last 72 hours. Urine analysis:    Component Value Date/Time   COLORURINE YELLOW (A) 08/25/2019 0109   APPEARANCEUR CLOUDY (A) 08/25/2019 0109   APPEARANCEUR Clear 05/26/2013 1843   LABSPEC 1.013 08/25/2019 0109   LABSPEC 1.018 05/26/2013 1843   PHURINE 7.0 08/25/2019 0109   GLUCOSEU NEGATIVE 08/25/2019 0109   GLUCOSEU >=500 05/26/2013 1843   HGBUR NEGATIVE 08/25/2019 0109   HGBUR trace-intact 07/29/2009 1507   BILIRUBINUR NEGATIVE 08/25/2019 0109   BILIRUBINUR neg 10/13/2017 1608   BILIRUBINUR Negative 05/26/2013 1843   KETONESUR NEGATIVE 08/25/2019 0109   PROTEINUR NEGATIVE 08/25/2019 0109   UROBILINOGEN 0.2 10/13/2017 1608   UROBILINOGEN 0.2 02/07/2011 2005   NITRITE NEGATIVE 08/25/2019 0109   LEUKOCYTESUR LARGE (A) 08/25/2019 0109   LEUKOCYTESUR Negative 05/26/2013 1843    Radiological Exams on Admission: CT Head Wo Contrast  Result Date: 08/25/2019 CLINICAL DATA:  Headache EXAM: CT HEAD WITHOUT CONTRAST TECHNIQUE: Contiguous axial images were obtained from the base of the skull through the vertex without intravenous contrast. COMPARISON:  May 22 2009 FINDINGS: Brain: No evidence of acute territorial infarction, hemorrhage, hydrocephalus,extra-axial collection or mass lesion/mass effect. There is dilatation the ventricles and sulci consistent with age-related atrophy. There is a subtle area of periventricular white matter change seen adjacent to the left frontal horn, series 2, image 18. Vascular: No hyperdense vessel or unexpected calcification. Skull: The skull is  intact. No fracture or focal lesion identified. Sinuses/Orbits: The visualized paranasal sinuses and mastoid air cells are clear. The orbits and globes intact. Other: None IMPRESSION: Findings which  could be suggestive of age indeterminate lacunar infarct or chronic small vessel ischemia involving the left frontal lobe. Findings consistent with age related atrophy Electronically Signed   By: Prudencio Pair M.D.   On: 08/25/2019 01:38     Assessment/Plan Actable nausea and vomiting  Placing patient on a clear liquid diet  Hydrating patient with intravenous isotonic fluids  Patient is already been provided with a multitude of various medications here in the emergency department including Toradol, intravenous steroids, intravenous Benadryl, intravenous Reglan with some improvement in symptoms.  Etiology is not completely clear at this time.  We will also initiate intravenous antibiotic therapy for possible concurrent urinary tract infection.    will also continue to hydrate patient with intravenous isotonic fluids.  Considering concurrent epigastric tenderness and elevated alkaline phosphatase with slightly elevated beta-hCG obtaining CT imaging of the abdomen and pelvis.  Provided patient with intravenous antiemetics.  Active Problems:   Type 2 diabetes mellitus with peripheral neuropathy ([place]) . Patient been placed on Accu-Cheks before every meal and nightly . Holding home regimen of hypoglycemics . Recent hemoglobin A1c of 8.6 suggestive of poor outpatient control. . Diabetic Diet    Complicated UTI (urinary tract infection)  Considering patient's leukocytosis and leukocytes on urinalysis, it is possible that a urinary tract infection is contributing to her symptoms of nausea and vomiting.  Awaiting urine microscopy and urine culture  Initiating empiric regimen of intravenous ciprofloxacin (allergic to Ceftriaxone)  CT imaging of abdomen and pelvis  Hydrating patient with intravenous isotonic fluids    Intractable headache   Etiology of intractable headache is not completely clear  CT head reveals no evidence of hemorrhage.  Physical exam reveals no evidence of  meningitis  While there is a small lacunar infarct noted on CT, it is unlikely that this is acute and even more unlikely that this is contributing to patient's headache.  With patient's complaints of possible floaters in the right field of vision, will obtain MRI brain to identify any obvious evidence of retinal hemorrhage and to secondarily identify age of lacunar infarct.  Slight tenderness of right temporal region.  Patient complains of jaw pain with mastication however this is seemingly chronic.  Patient also complains of blurry vision but states this is also likely chronic and has some degree of diabetic retinopathy.  Giant cell arteritis unlikely however will obtain ESR.    Providing patient with as needed analgesics for substantial pain.  Symptoms do seem to be improving with cocktail of medications provided in the emergency department.  Hydrating patient with intravenous isotonic fluids  And underlying suspected urinary tract infection.     Essential hypertension  Review of outpatient records reveals that patient is typically well controlled on monotherapy with hydrochlorothiazide  Temporarily holding hydrochlorothiazide however due to mild clinical dehydration  Will restart oral antihypertensives as tolerated, provide patient with peer and intravenous antihypertensives are excessively elevated blood pressures    Hypothyroidism  Continue home regimen of Synthroid  Obtain repeat TSH    Class 3 severe obesity due to excess calories with serious comorbidity and body mass index (BMI) of 40.0 to 44.9 in adult [facility])  We will counsel patient on caloric restriction and regular physical activity once patient is clinically improved.    GERD without esophagitis   Protonix 40 mg IV daily while  patient is having difficulty tolerating oral intake        Code Status:  Full code Disposition Plan: Patient is anticipated to be discharged to home once patient has met maximum  benefit from current hospitalization.   Consults called: None  Admission status: Patient will be admitted to Observation and is anticipated to remain in the hospital for less than 2 midnights.   Vernelle Emerald MD [facility] Pager [phone]  If 7PM-7AM, please contact night-coverage [email or web address] Use universal [place] password for that web site. If you do not have the password, please call the hospital operator.  08/25/2019, 5:10 AM

## 2019-08-25 NOTE — ED Notes (Signed)
Pt returned from radiology s/p LP. Laying flat at this time. No complaints, NAD. Call bell at bedside. Cont to monitor.

## 2019-08-25 NOTE — ED Notes (Signed)
Placed in hospital bed for comfort.

## 2019-08-25 NOTE — Progress Notes (Signed)
Patient arrives to room 1506 at this time via bed from ED.  Patient awake, alert and oriented x4.  Patient to remain laying flat until 1700 after having LP this afternoon.  Patient oriented to callbell with stated understanding.

## 2019-08-25 NOTE — Progress Notes (Signed)
PROGRESS NOTE    Alexa Woods  [id number] DOB: 1960/06/19 DOA: 08/24/2019 PCP: Abner Greenspan, MD   Brief Narrative: 59 year old with past medical history significant for obesity, hypertension, diabetes type 2, GERD, chronic pain syndrome who presents to the emergency department complaining of headache and vomiting that is started 2 days prior to admission.  She report headache behind her right eye radiating to the occipital region.  She has chronic blurriness of her vision, which has not changed lately.  She reported one episode of black spot in her right eye.  She also reporter  abdominal pain.  Denies neck stiffness, fever no sick contact.  Patient received IV Toradol, Benadryl, IV steroids in the emergency department.  Patient continued to have persistent nausea and vomiting she was admitted for further evaluation.  Patient underwent CT head; Findings which could be suggestive of age indeterminate lacunar infarct or chronic small vessel ischemia involving the left frontal lobe.  CT abdomen and pelvis negative for acute pathology.  She was found to have a UTI ,  UA 11-20 white blood cell, large leukocytes.  MRI of the brain showed; Ventricular prominence which has noticeably progressed as compared to head CT 05/22/2009 and is suspicious for communicating hydrocephalus.   Care discussed with neurology who recommended an LP.  Patient underwent LP which showed normal pressure.  Neurosurgery has been consulted.  Assessment & Plan:   Principal Problem:   Intractable headache Active Problems:   Type 2 diabetes mellitus with peripheral neuropathy (HCC)   Class 3 severe obesity due to excess calories with serious comorbidity and body mass index (BMI) of 40.0 to 44.9 in adult [facility])   Essential hypertension   GERD without esophagitis   Hyperlipidemia associated with type 2 diabetes mellitus ([place])   Complicated UTI (urinary tract infection)   Hypothyroidism   Intractable nausea and  vomiting  Estimated body mass index is 40.35 kg/m as calculated from the following:   Height as of this encounter: 5\' 6"  (1.676 m).   Weight as of this encounter: 113.4 kg.  1-Intractable Nausea, vomiting Unclear etiology, related to UTI versus neurological mediated. Continue with IV fluids. Continue with clear diet. Continue with as needed Zofran.  2-Headache, vision changes MRI with increased ventricle size. Neurology consulted. Patient underwent LP, which showed normal pressure.  Neurosurgery has been consulted. Discussed with Dr Arnoldo Morale, with neurosurgery he recommend out patient follow up with him next week.   3-UTI: Continue with IV ciprofloxacin.  Continue with IV fluids. CT abdomen and pelvis negative.  4-Diabetes type 2: Continue with sliding scale insulin.  5-Hypertension: holding diuretics.  Start norvasc   6-Hypothyroidism: Continue with Synthroid  Obesity class III BMI more than 40  GERD; Protonix    DVT prophylaxis: SCD Code Status: Full code Family Communication: Care discussed with patient.  Disposition Plan:  Patient is from: Home  Anticipated d/c date:  In 1 --3 days depending on neurosurgery and neurology recommendation.  Barriers to d/c or necessity for inpatient status:  Consultants:   Neurology   Neurosurgery   Procedures:  LP  Antimicrobials:  none  Subjective: Alert, report some improvement of headaches after pain medications.  She report headaches and nausea for last 2 days.  Right eye pain and chronic blurry vision. And decreased vision right eye since December.  Report episode of black hallo right eye , resolved.   Objective: Vitals:   08/25/19 1330 08/25/19 1345 08/25/19 1400 08/25/19 1415  BP: (!) 158/74 (!) 157/89 Marland Kitchen)  156/77 (!) 166/79  Pulse: 65 63 62 69  Resp:  11 10 14   Temp:      TempSrc:      SpO2: 97% 97% 99% 97%  Weight:      Height:        Intake/Output Summary (Last 24 hours) at 08/25/2019 1432 Last data  filed at 08/25/2019 [id number] Gross per 24 hour  Intake 750 ml  Output --  Net 750 ml   Filed Weights   08/24/19 2308 08/25/19 0740  Weight: 113.4 kg 113.4 kg    Examination:  General exam: Appears calm and comfortable  Respiratory system: Clear to auscultation. Respiratory effort normal. Cardiovascular system: S1 & S2 heard, RRR. No JVD, murmurs, rubs, gallops or clicks. No pedal edema. Gastrointestinal system: Abdomen is nondistended, soft and nontender. No organomegaly or masses felt. Normal bowel sounds heard. Central nervous system: Alert and oriented. No focal neurological deficits. Blurry vision.  Extremities: Symmetric 5 x 5 power. Skin: No rashes, lesions or ulcers    Data Reviewed: I have personally reviewed following labs and imaging studies  CBC: Recent Labs  Lab 08/25/19 0111  WBC 13.5*  HGB 14.0  HCT 42.9  MCV 86.0  PLT [id number]   Basic Metabolic Panel: Recent Labs  Lab 08/25/19 0111  NA 141  K 3.7  CL 104  CO2 28  GLUCOSE 160*  BUN 14  CREATININE 0.88  CALCIUM 9.4   GFR: Estimated Creatinine Clearance: 91.2 mL/min (by C-G formula based on SCr of 0.88 mg/dL). Liver Function Tests: Recent Labs  Lab 08/25/19 0111  AST 24  ALT 28  ALKPHOS 143*  BILITOT 0.3  PROT 8.4*  ALBUMIN 4.3   Recent Labs  Lab 08/25/19 0111  LIPASE 19   No results for input(s): AMMONIA in the last 168 hours. Coagulation Profile: No results for input(s): INR, PROTIME in the last 168 hours. Cardiac Enzymes: No results for input(s): CKTOTAL, CKMB, CKMBINDEX, TROPONINI in the last 168 hours. BNP (last 3 results) No results for input(s): PROBNP in the last 8760 hours. HbA1C: Recent Labs    08/25/19 0111  HGBA1C 7.6*   CBG: Recent Labs  Lab 08/25/19 0754 08/25/19 1221 08/25/19 1341  GLUCAP 269* 238* 255*   Lipid Profile: No results for input(s): CHOL, HDL, LDLCALC, TRIG, CHOLHDL, LDLDIRECT in the last 72 hours. Thyroid Function Tests: Recent Labs     08/25/19 0523  TSH 1.821   Anemia Panel: No results for input(s): VITAMINB12, FOLATE, FERRITIN, TIBC, IRON, RETICCTPCT in the last 72 hours. Sepsis Labs: No results for input(s): PROCALCITON, LATICACIDVEN in the last 168 hours.  Recent Results (from the past 240 hour(s))  SARS CORONAVIRUS 2 (TAT 6-24 HRS) Nasopharyngeal Nasopharyngeal Swab     Status: None   Collection Time: 08/25/19  4:45 AM   Specimen: Nasopharyngeal Swab  Result Value Ref Range Status   SARS Coronavirus 2 NEGATIVE NEGATIVE Final    Comment: (NOTE) SARS-CoV-2 target nucleic acids are NOT DETECTED. The SARS-CoV-2 RNA is generally detectable in upper and lower respiratory specimens during the acute phase of infection. Negative results do not preclude SARS-CoV-2 infection, do not rule out co-infections with other pathogens, and should not be used as the sole basis for treatment or other patient management decisions. Negative results must be combined with clinical observations, patient history, and epidemiological information. The expected result is Negative. Fact Sheet for Patients: SugarRoll.be Fact Sheet for Healthcare Providers: https://www.woods-mathews.com/ This test is not yet approved or cleared by the [place]  States FDA and  has been authorized for detection and/or diagnosis of SARS-CoV-2 by FDA under an Emergency Use Authorization (EUA). This EUA will remain  in effect (meaning this test can be used) for the duration of the COVID-19 declaration under Section 56 4(b)(1) of the Act, 21 U.S.C. section 360bbb-3(b)(1), unless the authorization is terminated or revoked sooner. Performed at [facility], [place] [address], [place],  [address]          Radiology Studies: CT Head Wo Contrast  Result Date: 08/25/2019 CLINICAL DATA:  Headache EXAM: CT HEAD WITHOUT CONTRAST TECHNIQUE: Contiguous axial images were obtained from the base of the skull through  the vertex without intravenous contrast. COMPARISON:  May 22 2009 FINDINGS: Brain: No evidence of acute territorial infarction, hemorrhage, hydrocephalus,extra-axial collection or mass lesion/mass effect. There is dilatation the ventricles and sulci consistent with age-related atrophy. There is a subtle area of periventricular white matter change seen adjacent to the left frontal horn, series 2, image 18. Vascular: No hyperdense vessel or unexpected calcification. Skull: The skull is intact. No fracture or focal lesion identified. Sinuses/Orbits: The visualized paranasal sinuses and mastoid air cells are clear. The orbits and globes intact. Other: None IMPRESSION: Findings which could be suggestive of age indeterminate lacunar infarct or chronic small vessel ischemia involving the left frontal lobe. Findings consistent with age related atrophy Electronically Signed   By: Prudencio Pair M.D.   On: 08/25/2019 01:38   MR BRAIN WO CONTRAST  Result Date: 08/25/2019 CLINICAL DATA:  Headache, no history of headache; headache, acute, normal neuro exam. Additional history provided: Patient with history of obesity, hypertension, diabetes mellitus type 2, gastroesophageal reflux disease and chronic pain syndrome who presents to the emergency department with headache and vomiting. EXAM: MRI HEAD WITHOUT CONTRAST TECHNIQUE: Multiplanar, multiecho pulse sequences of the brain and surrounding structures were obtained without intravenous contrast. COMPARISON:  Head CT 08/25/2019, head CT 05/22/2009 FINDINGS: Brain: There is no evidence of acute infarct. No evidence of intracranial mass. No midline shift or extra-axial fluid collection. No chronic intracranial blood products. Mild scattered T2/FLAIR hyperintensity within the cerebral white matter is advanced for age. Ventricular prominence which has noticeably progressed as compared to prior head CT 05/22/2009. Cerebral volume is normal for age. Vascular: Flow voids  maintained within the proximal large arterial vessels. Skull and upper cervical spine: No focal marrow lesion. Sinuses/Orbits: Visualized orbits demonstrate no acute abnormality. Trace ethmoid sinus mucosal thickening. No significant mastoid effusion. IMPRESSION: Ventricular prominence which has noticeably progressed as compared to head CT 05/22/2009 and is suspicious for communicating hydrocephalus. Age advanced scattered T2 hyperintense signal changes within the cerebral white matter. Findings are nonspecific, but likely reflect sequela of chronic small vessel ischemic disease given the provided history of hypertension and diabetes. No evidence of acute infarction. Electronically Signed   By: Kellie Simmering DO   On: 08/25/2019 07:23   CT ABDOMEN PELVIS W CONTRAST  Result Date: 08/25/2019 CLINICAL DATA:  Acute nonlocalized abdominal pain EXAM: CT ABDOMEN AND PELVIS WITH CONTRAST TECHNIQUE: Multidetector CT imaging of the abdomen and pelvis was performed using the standard protocol following bolus administration of intravenous contrast. CONTRAST:  124mL OMNIPAQUE IOHEXOL 300 MG/ML  SOLN COMPARISON:  Renal stone CT 06/05/2016 FINDINGS: Lower chest:  No contributory findings. Hepatobiliary: No focal liver abnormality.Cholecystectomy. No bile duct dilatation. Pancreas: Unremarkable. Spleen: Unremarkable. Adrenals/Urinary Tract: Negative adrenals. No hydronephrosis or stone. Bilateral upper pole renal cyst. Unremarkable bladder. Stomach/Bowel:  No obstruction. No appendicitis. Vascular/Lymphatic: No  acute vascular abnormality. No mass or adenopathy. Reproductive:Hysterectomy and probable pelvic floor laxity. Other: No ascites or pneumoperitoneum. Musculoskeletal: No acute abnormalities. 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 IMPRESSION: No acute finding or explanation for abdominal pain. Electronically Signed   By: Monte Fantasia M.D.   On: 08/25/2019 07:15   DG FLUORO GUIDE LUMBAR  PUNCTURE  Result Date: 08/25/2019 CLINICAL DATA:  Headache. Suggestion of hydrocephalus on MRI. Concern for increased intracranial pressure. EXAM: DIAGNOSTIC LUMBAR PUNCTURE UNDER FLUOROSCOPIC GUIDANCE FLUOROSCOPY TIME:  Radiation Exposure Index (as provided by the fluoroscopic device): 40 mGy If the device does not provide the exposure index: Fluoroscopy Time (in minutes and seconds):  1.2 minute Number of Acquired Images:  1 PROCEDURE: Informed consent was obtained from the patient prior to the procedure, including potential complications of headache, allergy, and pain. With the patient prone, the lower back was prepped with Betadine. 1% Lidocaine was used for local anesthesia. Lumbar puncture was performed at the L4-L5 level using a 22 gauge needle with return of clear CSF with an opening pressure of 15 cm water. Closing pressure equal 12 cm water. Six ml of CSF were obtained for laboratory studies. The patient tolerated the procedure well and there were no apparent complications. IMPRESSION: 1. Successful lumbar puncture. 2. Normal opening and closing pressure. 3. Only 6 cc of CSF collected as pressure was low. Electronically Signed   By: Suzy Bouchard M.D.   On: 08/25/2019 14:12        Scheduled Meds: . insulin aspart  0-15 Units Subcutaneous TID AC & HS  . insulin glargine  20 Units Subcutaneous Daily  . levothyroxine  75 mcg Oral Daily  . LORazepam      . LORazepam  1 mg Intravenous Once  . pantoprazole (PROTONIX) IV  40 mg Intravenous Q24H  . senna  1 tablet Oral Daily  . sertraline  200 mg Oral q morning - 10a  . sodium chloride (PF)      . sodium chloride flush  3 mL Intravenous Once   Continuous Infusions: . sodium chloride 75 mL/hr at 08/25/19 0906  . ciprofloxacin Stopped (08/25/19 0907)     LOS: 0 days    Time spent: 35 minutes.     Elmarie Shiley, MD [facility]   If 7PM-7AM, please contact night-coverage [email or web address]  08/25/2019, 2:32 PM

## 2019-08-25 NOTE — ED Notes (Addendum)
Pt transported to MRI 

## 2019-08-25 NOTE — ED Notes (Signed)
Pt up to bedside commode and returned to stretcher without incident.

## 2019-08-25 NOTE — Progress Notes (Signed)
PHARMACY - ENOXAPARIN  Pharmacy has been asked to adjust enoxaparin (lovenox) dosing as needed in this patient for DVT prophylaxis.    Height: 66" Weight: 113.4 kg BMI: 40 CrCl: 91 ml/min  Assessment:  BMI >30 and CrCl > 30 ml/min therefore will adjust Enoxaparin dose (0.5 mg/kg/q24h)  Plan: Enoxaparin 55mg  sq q24h for VTE prophylaxis  Thank you Leone Haven, PharmD

## 2019-08-25 NOTE — ED Notes (Addendum)
Nurse from floor called to inform pt was not appropriate for the unit due to cardiac monitor. MD made aware of need to change bed request

## 2019-08-26 LAB — COMPREHENSIVE METABOLIC PANEL
ALT: 21 U/L (ref 0–44)
AST: 13 U/L — ABNORMAL LOW (ref 15–41)
Albumin: 3.8 g/dL (ref 3.5–5.0)
Alkaline Phosphatase: 107 U/L (ref 38–126)
Anion gap: 8 (ref 5–15)
BUN: 15 mg/dL (ref 6–20)
CO2: 26 mmol/L (ref 22–32)
Calcium: 8.8 mg/dL — ABNORMAL LOW (ref 8.9–10.3)
Chloride: 110 mmol/L (ref 98–111)
Creatinine, Ser: 0.8 mg/dL (ref 0.44–1.00)
GFR calc Af Amer: >60 mL/min (ref >60)
GFR calc non Af Amer: >60 mL/min (ref >60)
Glucose, Bld: 174 mg/dL — ABNORMAL HIGH (ref 70–99)
Potassium: 3.8 mmol/L (ref 3.5–5.1)
Sodium: 144 mmol/L (ref 135–145)
Total Bilirubin: 0.5 mg/dL (ref 0.3–1.2)
Total Protein: 7.3 g/dL (ref 6.5–8.1)

## 2019-08-26 LAB — CBC WITH DIFFERENTIAL/PLATELET
Abs Immature Granulocytes: 0.05 10*3/uL (ref 0.00–0.07)
Basophils Absolute: 0 10*3/uL (ref 0.0–0.1)
Basophils Relative: 0 %
Eosinophils Absolute: 0 10*3/uL (ref 0.0–0.5)
Eosinophils Relative: 0 %
HCT: 36.2 % (ref 36.0–46.0)
Hemoglobin: 12.2 g/dL (ref 12.0–15.0)
Immature Granulocytes: 0 %
Lymphocytes Relative: 13 %
Lymphs Abs: 1.6 10*3/uL (ref 0.7–4.0)
MCH: 29 pg (ref 26.0–34.0)
MCHC: 33.7 g/dL (ref 30.0–36.0)
MCV: 86 fL (ref 80.0–100.0)
Monocytes Absolute: 0.9 10*3/uL (ref 0.1–1.0)
Monocytes Relative: 8 %
Neutro Abs: 9.4 10*3/uL — ABNORMAL HIGH (ref 1.7–7.7)
Neutrophils Relative %: 79 %
Platelets: 312 10*3/uL (ref 150–400)
RBC: 4.21 MIL/uL (ref 3.87–5.11)
RDW: 13.1 % (ref 11.5–15.5)
WBC: 12 10*3/uL — ABNORMAL HIGH (ref 4.0–10.5)
nRBC: 0 % (ref 0.0–0.2)

## 2019-08-26 LAB — MAGNESIUM: Magnesium: 2.3 mg/dL (ref 1.7–2.4)

## 2019-08-26 LAB — GLUCOSE, CAPILLARY
Glucose-Capillary: 155 mg/dL — ABNORMAL HIGH (ref 70–99)
Glucose-Capillary: 154 mg/dL — ABNORMAL HIGH (ref 70–99)
Glucose-Capillary: 257 mg/dL — ABNORMAL HIGH (ref 70–99)

## 2019-08-26 MED ORDER — ACETAMINOPHEN 325 MG PO TABS: 650.0000 mg | ORAL_TABLET | ORAL | 0 refills | Status: DC | PRN

## 2019-08-26 MED ORDER — CIPROFLOXACIN HCL 500 MG PO TABS: 500.0000 mg | ORAL_TABLET | Freq: Two times a day (BID) | ORAL | 0 refills | Status: AC

## 2019-08-26 MED ORDER — PANTOPRAZOLE SODIUM 40 MG PO TBEC: 40.0000 mg | DELAYED_RELEASE_TABLET | Freq: Two times a day (BID) | ORAL | 3 refills | Status: DC

## 2019-08-26 NOTE — Progress Notes (Signed)
Have alerted MD via text concerning ?Airborne Precautions. Will await instruction.

## 2019-08-26 NOTE — Evaluation (Signed)
Physical Therapy Evaluation Patient Details Name: Alexa Woods MRN: [id number] DOB: 1960/06/28 Today's Date: 08/26/2019   History of Present Illness  59 year old with past medical history significant for obesity, hypertension, diabetes type 2, GERD, chronic pain syndrome who presents to the emergency department complaining of headache and vomiting that is started 2 days prior to admission.  She report headache behind her right eye radiating to the occipital region.  She has chronic blurriness of her vision, which has not changed lately.  She reported one episode of black spot in her right eye.  She also reporter  abdominal pain.  Denies neck stiffness, fever no sick contact.    Clinical Impression  Patient evaluated by Physical Therapy with no further acute PT needs identified. All education has been completed and the patient has no further questions. Patient does report a history of neck disc issues. Patient's eyes were dilated for testing prior to PT's arrival. Patient had all lights off in her room. Patient able to perform bed mobility, transfers and ambulation without assistive devices modified independence.  See below for any follow-up Physical Therapy or equipment needs. PT is signing off. Thank you for this referral.     Follow Up Recommendations No PT follow up;Supervision - Intermittent    Equipment Recommendations  None recommended by PT    Recommendations for Other Services       Precautions / Restrictions Precautions Precautions: None Precaution Comments: fell in Aug 2020 breaking left shoulder      Mobility  Bed Mobility Overal bed mobility: Modified Independent             General bed mobility comments: increased time  Transfers Overall transfer level: Modified independent Equipment used: None             General transfer comment: increased time  Ambulation/Gait Ambulation/Gait assistance: Modified independent (Device/Increase time) Gait Distance  (Feet): 250 Feet Assistive device: None Gait Pattern/deviations: Step-through pattern;Decreased step length - right;Decreased step length - left;Decreased stride length Gait velocity: decreased   General Gait Details: increased time, was able to look at therapist periodically to hold a conversation. able to turn directions and stop without loss of balance.  Stairs            Wheelchair Mobility    Modified Rankin (Stroke Patients Only)       Balance Overall balance assessment: Modified Independent                                           Pertinent Vitals/Pain Pain Assessment: 0-10 Pain Score: 6  Pain Location: neck with history of disc issues and lumbar pain from lumbar puncture. Pain Descriptors / Indicators: Aching;Dull Pain Intervention(s): Limited activity within patient's tolerance;Monitored during session;Premedicated before session    Home Living Family/patient expects to be discharged to:: Private residence Living Arrangements: Spouse/significant other Available Help at Discharge: Family;Available PRN/intermittently Type of Home: House Home Access: Stairs to enter Entrance Stairs-Rails: Right;Left;Can reach both   Home Layout: One level Home Equipment: Environmental consultant - 2 wheels;Toilet riser;Cane - quad;Hand held shower head      Prior Function Level of Independence: Independent               Hand Dominance   Dominant Hand: Left    Extremity/Trunk Assessment   Upper Extremity Assessment Upper Extremity Assessment: Overall WFL for tasks assessed    Lower Extremity  Assessment Lower Extremity Assessment: Overall WFL for tasks assessed    Cervical / Trunk Assessment Cervical / Trunk Assessment: Normal  Communication   Communication: No difficulties  Cognition Arousal/Alertness: Awake/alert Behavior During Therapy: WFL for tasks assessed/performed Overall Cognitive Status: Within Functional Limits for tasks assessed                                         General Comments      Exercises     Assessment/Plan    PT Assessment Patent does not need any further PT services  PT Problem List         PT Treatment Interventions      PT Goals (Current goals can be found in the Care Plan section)  Acute Rehab PT Goals Patient Stated Goal: Go home. PT Goal Formulation: With patient Time For Goal Achievement: 09/09/19 Potential to Achieve Goals: Good    Frequency     Barriers to discharge        Co-evaluation               AM-PAC PT "6 Clicks" Mobility  Outcome Measure Help needed turning from your back to your side while in a flat bed without using bedrails?: None Help needed moving from lying on your back to sitting on the side of a flat bed without using bedrails?: None Help needed moving to and from a bed to a chair (including a wheelchair)?: A Little Help needed standing up from a chair using your arms (e.g., wheelchair or bedside chair)?: A Little Help needed to walk in hospital room?: A Little Help needed climbing 3-5 steps with a railing? : A Little 6 Click Score: 20    End of Session   Activity Tolerance: Patient tolerated treatment well;Other (comment)(patient limited due to eyes being dilated for testing prior to PT arrival.) Patient left: in bed;with family/visitor present;with call bell/phone within reach Nurse Communication: Mobility status PT Visit Diagnosis: Unsteadiness on feet (R26.81)    Time: [id number] PT Time Calculation (min) (ACUTE ONLY): 30 min   Charges:   PT Evaluation $PT Eval Low Complexity: 1 Low PT Treatments $Gait Training: 8-22 mins        Floria Raveling. Hartnett-Rands, MS, PT Per [place] [id number] 08/26/2019, 3:05 PM

## 2019-08-26 NOTE — Progress Notes (Cosign Needed)
Pt given a diet ginger ale. IV pump plugged in as pt was concerned it needed to be charged. Pt expresses wish to go home, but stated she understands she needs to stay so the doctors can find what is causing her symptoms. No additional needs expressed at this time.

## 2019-08-26 NOTE — Progress Notes (Signed)
CSW provided pt with substance use resources as requested.  Please reconsult if future social work needs arise.  CSW signing off, as social work intervention is no longer needed.  Alexa Woods. Alexa Woods  MSW, LCSW, LCAS, CSI Transitions of Care Clinical Social Worker Care Coordination Department Ph: [phone]

## 2019-08-26 NOTE — Discharge Summary (Signed)
Physician Discharge Summary  Alexa Woods [id number] DOB: 1961-02-09 DOA: 08/24/2019  PCP: Abner Greenspan, MD  Admit date: 08/24/2019 Discharge date: 08/26/2019  Admitted From: Home  Disposition:  Home   Recommendations for Outpatient Follow-up:  1. Follow up with PCP in 1-2 weeks 2. Please obtain BMP/CBC in one week 3. Please follow up on the following pending results: Herpes CSF  4. Needs to follow up with Neurosurgery for hydrocephalus.   Home Health:   Discharge Condition: Stable.  CODE STATUS: Full code Diet recommendation: Carb Modified   Brief/Interim Summary: 59 year old with past medical history significant for obesity, hypertension, diabetes type 2, GERD, chronic pain syndrome who presents to the emergency department complaining of headache and vomiting that is started 2 days prior to admission.  She report headache behind her right eye radiating to the occipital region.  She has chronic blurriness of her vision, which has not changed lately.  She reported one episode of black spot in her right eye.  She also reporter  abdominal pain.  Denies neck stiffness, fever no sick contact.  Patient received IV Toradol, Benadryl, IV steroids in the emergency department.  Patient continued to have persistent nausea and vomiting she was admitted for further evaluation.  Patient underwent CT head; Findings which could be suggestive of age indeterminate lacunar infarct or chronic small vessel ischemia involving the left frontal lobe.  CT abdomen and pelvis negative for acute pathology.  She was found to have a UTI ,  UA 11-20 white blood cell, large leukocytes.  MRI of the brain showed; Ventricular prominence which has noticeably progressed as compared to head CT 05/22/2009 and is suspicious for communicating hydrocephalus.   Care discussed with neurology who recommended an LP.  Patient underwent LP which showed normal pressure.  Neurosurgery has been consulted.  1-Intractable  Nausea, vomiting Unclear etiology, related to UTI versus neurological mediated. Continue with IV fluids. Tolerating clear, advance diet.  Continue with as needed Zofran.  2-Headache, vision changes MRI with increased ventricle size. Neurology consulted. LP few WBC, no clear cut for meningitis per neurology. Unclear etiology of headaches. Aseptic  meningitis vs migraine. Discussed with Dr Idelle Jo, no need to wait for Herpes CSF.  Patient underwent LP, which showed normal pressure.  Neurosurgery has been consulted. Discussed with Dr Arnoldo Morale, with neurosurgery he recommend out patient follow up with him next week for hydrocephalus.   evaluated by ophthalmology. 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  Continue with IV fluids. CT abdomen and pelvis negative. Discharge on 3 days ciprofloxacin.  Urine culture grew E coli.   4-Diabetes type 2: Continue with sliding scale insulin. Resume home dose lantus.   5-Hypertension: resume diuretic at discharge.    6-Hypothyroidism: Continue with Synthroid  Obesity class III BMI more than 40  GERD; Protonix change to BID.    Discharge Diagnoses:  Principal Problem:   Intractable headache Active Problems:   Type 2 diabetes mellitus with peripheral neuropathy (HCC)   Class 3 severe obesity due to excess calories with serious comorbidity and body mass index (BMI) of 40.0 to 44.9 in adult [facility])   Essential hypertension   GERD without esophagitis   Hyperlipidemia associated with type 2 diabetes mellitus ([place])   Complicated UTI (urinary tract infection)   Hypothyroidism   Intractable nausea and vomiting    Discharge Instructions  Discharge Instructions    Diet - low sodium heart healthy   Complete by: As directed  Increase activity slowly   Complete by: As directed      Allergies as of 08/26/2019      Reactions   Bupropion Hcl Other (See Comments)   Pt is unsure   Cefuroxime  Axetil Nausea Only   Darvon [propoxyphene]    wheezing   Gabapentin    Pt does not remember reaction    Lidocaine    REACTION: unknown   Metformin    REACTION: GI   Paroxetine    REACTION: doesn't agree   Tramadol Hcl    REACTION: Causes Anxiety   Codeine Nausea And Vomiting, Rash   Sulfa Antibiotics Rash      Medication List    TAKE these medications   acetaminophen 325 MG tablet Commonly known as: TYLENOL Take 2 tablets (650 mg total) by mouth every 4 (four) hours as needed for mild pain or headache (or Fever >/= 101).   albuterol 108 (90 Base) MCG/ACT inhaler Commonly known as: VENTOLIN HFA Inhale 2 puffs into the lungs every 4 (four) hours as needed for wheezing or shortness of breath.   Ambien 10 MG tablet Generic drug: zolpidem Take 10 mg by mouth at bedtime as needed for sleep.   BD Insulin Syringe U/F 31G X 5/16" 0.5 ML Misc Generic drug: Insulin Syringe-Needle U-100 USE THREE TIMES PER DAY WITH R INSULIN & ONCE DAILY WITH LEVEMIR   beclomethasone 80 MCG/ACT inhaler Commonly known as: QVAR Inhale 2 puffs 2 (two) times daily into the lungs. Rinse mouth after use.   ciprofloxacin 500 MG tablet Commonly known as: Cipro Take 1 tablet (500 mg total) by mouth 2 (two) times daily for 3 days.   docusate sodium 100 MG capsule Commonly known as: COLACE Take 100 mg by mouth every morning.   fluticasone 50 MCG/ACT nasal spray Commonly known as: FLONASE Place 2 sprays into both nostrils daily as needed for allergies or rhinitis.   glucose blood test strip Commonly known as: ONE TOUCH ULTRA TEST Use to test blood sugar 4 times daily as instructed.   hydrochlorothiazide 25 MG tablet Commonly known as: HYDRODIURIL TAKE 1 TABLET BY MOUTH EVERY DAY   insulin glargine 100 UNIT/ML injection Commonly known as: Lantus Inject 0.35 mLs (35 Units total) into the skin daily. What changed: how much to take   insulin regular 100 units/mL injection Commonly known as:  HumuLIN R INJECT 0.10-0.14 ML'S  INTO THE SKIN THREE TIMES A DAY BEFORE MEALS. What changed:   how much to take  how to take this  when to take this  additional instructions   Klor-Con M10 10 MEQ tablet Generic drug: potassium chloride TAKE 1 TABLET BY MOUTH EVERY DAY What changed: how much to take   levothyroxine 75 MCG tablet Commonly known as: SYNTHROID Take 1 tablet (75 mcg total) by mouth daily.   LORazepam 1 MG tablet Commonly known as: ATIVAN Take 2 mg by mouth 2 (two) times daily as needed for anxiety or sleep.   onetouch ultrasoft lancets Use to test blood sugar 4 times daily as instructed.   oxyCODONE-acetaminophen 10-325 MG tablet Commonly known as: PERCOCET Take 1 tablet by mouth every 4 (four) hours as needed for pain. Hold for SBP < = 105   pantoprazole 40 MG tablet Commonly known as: PROTONIX Take 1 tablet (40 mg total) by mouth 2 (two) times daily. What changed: when to take this   polyethylene glycol 17 g packet Commonly known as: MIRALAX / GLYCOLAX Take 17 g by mouth  daily as needed for mild constipation or moderate constipation.   senna 8.6 MG Tabs tablet Commonly known as: SENOKOT Take 1 tablet by mouth daily.   sertraline 100 MG tablet Commonly known as: ZOLOFT Take 200 mg by mouth every morning.   tiZANidine 4 MG tablet Commonly known as: ZANAFLEX Take 4 mg by mouth 2 (two) times daily as needed for muscle spasms.   triamcinolone cream 0.1 % Commonly known as: KENALOG Apply 1 application topically 2 (two) times daily. To psoriasis areas   UNABLE TO FIND Diet type : NCS      Follow-up Information    Newman Pies, MD Follow up in 1 week(s).   Specialty: Neurosurgery Why: schedule appointment for follow up on hydrocephalus/  Contact information: [address] [place] [address] [phone]          Allergies  Allergen Reactions  . Bupropion Hcl Other (See Comments)    Pt is unsure  . Cefuroxime  Axetil Nausea Only  . Darvon [Propoxyphene]     wheezing  . Gabapentin     Pt does not remember reaction   . Lidocaine     REACTION: unknown  . Metformin     REACTION: GI  . Paroxetine     REACTION: doesn't agree  . Tramadol Hcl     REACTION: Causes Anxiety  . Codeine Nausea And Vomiting and Rash  . Sulfa Antibiotics Rash    Consultations: Neurology Neurosurgery Ophthalmology   Procedures/Studies: CT Head Wo Contrast  Result Date: 08/25/2019 CLINICAL DATA:  Headache EXAM: CT HEAD WITHOUT CONTRAST TECHNIQUE: Contiguous axial images were obtained from the base of the skull through the vertex without intravenous contrast. COMPARISON:  May 22 2009 FINDINGS: Brain: No evidence of acute territorial infarction, hemorrhage, hydrocephalus,extra-axial collection or mass lesion/mass effect. There is dilatation the ventricles and sulci consistent with age-related atrophy. There is a subtle area of periventricular white matter change seen adjacent to the left frontal horn, series 2, image 18. Vascular: No hyperdense vessel or unexpected calcification. Skull: The skull is intact. No fracture or focal lesion identified. Sinuses/Orbits: The visualized paranasal sinuses and mastoid air cells are clear. The orbits and globes intact. Other: None IMPRESSION: Findings which could be suggestive of age indeterminate lacunar infarct or chronic small vessel ischemia involving the left frontal lobe. Findings consistent with age related atrophy Electronically Signed   By: Prudencio Pair M.D.   On: 08/25/2019 01:38   MR BRAIN WO CONTRAST  Result Date: 08/25/2019 CLINICAL DATA:  Headache, no history of headache; headache, acute, normal neuro exam. Additional history provided: Patient with history of obesity, hypertension, diabetes mellitus type 2, gastroesophageal reflux disease and chronic pain syndrome who presents to the emergency department with headache and vomiting. EXAM: MRI HEAD WITHOUT CONTRAST  TECHNIQUE: Multiplanar, multiecho pulse sequences of the brain and surrounding structures were obtained without intravenous contrast. COMPARISON:  Head CT 08/25/2019, head CT 05/22/2009 FINDINGS: Brain: There is no evidence of acute infarct. No evidence of intracranial mass. No midline shift or extra-axial fluid collection. No chronic intracranial blood products. Mild scattered T2/FLAIR hyperintensity within the cerebral white matter is advanced for age. Ventricular prominence which has noticeably progressed as compared to prior head CT 05/22/2009. Cerebral volume is normal for age. Vascular: Flow voids maintained within the proximal large arterial vessels. Skull and upper cervical spine: No focal marrow lesion. Sinuses/Orbits: Visualized orbits demonstrate no acute abnormality. Trace ethmoid sinus mucosal thickening. No significant mastoid effusion. IMPRESSION: Ventricular prominence  which has noticeably progressed as compared to head CT 05/22/2009 and is suspicious for communicating hydrocephalus. Age advanced scattered T2 hyperintense signal changes within the cerebral white matter. Findings are nonspecific, but likely reflect sequela of chronic small vessel ischemic disease given the provided history of hypertension and diabetes. No evidence of acute infarction. Electronically Signed   By: Kellie Simmering DO   On: 08/25/2019 07:23   CT ABDOMEN PELVIS W CONTRAST  Result Date: 08/25/2019 CLINICAL DATA:  Acute nonlocalized abdominal pain EXAM: CT ABDOMEN AND PELVIS WITH CONTRAST TECHNIQUE: Multidetector CT imaging of the abdomen and pelvis was performed using the standard protocol following bolus administration of intravenous contrast. CONTRAST:  147mL OMNIPAQUE IOHEXOL 300 MG/ML  SOLN COMPARISON:  Renal stone CT 06/05/2016 FINDINGS: Lower chest:  No contributory findings. Hepatobiliary: No focal liver abnormality.Cholecystectomy. No bile duct dilatation. Pancreas: Unremarkable. Spleen: Unremarkable.  Adrenals/Urinary Tract: Negative adrenals. No hydronephrosis or stone. Bilateral upper pole renal cyst. Unremarkable bladder. Stomach/Bowel:  No obstruction. No appendicitis. Vascular/Lymphatic: No acute vascular abnormality. No mass or adenopathy. Reproductive:Hysterectomy and probable pelvic floor laxity. Other: No ascites or pneumoperitoneum. Musculoskeletal: No acute abnormalities. 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 IMPRESSION: No acute finding or explanation for abdominal pain. Electronically Signed   By: Monte Fantasia M.D.   On: 08/25/2019 07:15   DG FLUORO GUIDE LUMBAR PUNCTURE  Result Date: 08/25/2019 CLINICAL DATA:  Headache. Suggestion of hydrocephalus on MRI. Concern for increased intracranial pressure. EXAM: DIAGNOSTIC LUMBAR PUNCTURE UNDER FLUOROSCOPIC GUIDANCE FLUOROSCOPY TIME:  Radiation Exposure Index (as provided by the fluoroscopic device): 40 mGy If the device does not provide the exposure index: Fluoroscopy Time (in minutes and seconds):  1.2 minute Number of Acquired Images:  1 PROCEDURE: Informed consent was obtained from the patient prior to the procedure, including potential complications of headache, allergy, and pain. With the patient prone, the lower back was prepped with Betadine. 1% Lidocaine was used for local anesthesia. Lumbar puncture was performed at the L4-L5 level using a 22 gauge needle with return of clear CSF with an opening pressure of 15 cm water. Closing pressure equal 12 cm water. Six ml of CSF were obtained for laboratory studies. The patient tolerated the procedure well and there were no apparent complications. IMPRESSION: 1. Successful lumbar puncture. 2. Normal opening and closing pressure. 3. Only 6 cc of CSF collected as pressure was low. Electronically Signed   By: Suzy Bouchard M.D.   On: 08/25/2019 14:12      Subjective: Headaches has resolved. Tolerating clear diet.  Daughter at bedside, they are aware    Discharge Exam: Vitals:   08/25/19 2045 08/26/19 0518  BP: 140/67 131/65  Pulse: 66 64  Resp: 20 20  Temp: 98.8 F (37.1 C) 98.4 F (36.9 C)  SpO2: 95% 97%     General: Pt is alert, awake, not in acute distress Cardiovascular: RRR, S1/S2 +, no rubs, no gallops Respiratory: CTA bilaterally, no wheezing, no rhonchi Abdominal: Soft, NT, ND, bowel sounds + Extremities: no edema, no cyanosis    The results of significant diagnostics from this hospitalization (including imaging, microbiology, ancillary and laboratory) are listed below for reference.     Microbiology: Recent Results (from the past 240 hour(s))  Culture, Urine     Status: Abnormal (Preliminary result)   Collection Time: 08/25/19  1:09 AM   Specimen: Urine, Clean Catch  Result Value Ref Range Status   Specimen Description   Final    URINE,  CLEAN CATCH Performed at [facility], [place] [address] [place], [place] [address]    Special Requests   Final    NONE Performed at [facility], [place] [address] [place], [place] [address]    Culture (A)  Final    >=100,000 COLONIES/mL GRAM NEGATIVE RODS IDENTIFICATION AND SUSCEPTIBILITIES TO FOLLOW Performed at [facility], [place] [address] [place], [place] [address]    Report Status PENDING  Incomplete  SARS CORONAVIRUS 2 (TAT 6-24 HRS) Nasopharyngeal Nasopharyngeal Swab     Status: None   Collection Time: 08/25/19  4:45 AM   Specimen: Nasopharyngeal Swab  Result Value Ref Range Status   SARS Coronavirus 2 NEGATIVE NEGATIVE Final    Comment: (NOTE) SARS-CoV-2 target nucleic acids are NOT DETECTED. The SARS-CoV-2 RNA is generally detectable in upper and lower respiratory specimens during the acute phase of infection. Negative results do not preclude SARS-CoV-2 infection, do not rule out co-infections with other pathogens, and should not be used as the sole basis for treatment or other patient management  decisions. Negative results must be combined with clinical observations, patient history, and epidemiological information. The expected result is Negative. Fact Sheet for Patients: SugarRoll.be Fact Sheet for Healthcare Providers: https://www.woods-mathews.com/ This test is not yet approved or cleared by the [place] FDA and  has been authorized for detection and/or diagnosis of SARS-CoV-2 by FDA under an Emergency Use Authorization (EUA). This EUA will remain  in effect (meaning this test can be used) for the duration of the COVID-19 declaration under Section 56 4(b)(1) of the Act, 21 U.S.C. section 360bbb-3(b)(1), unless the authorization is terminated or revoked sooner. Performed at [facility], [place] [address], [place], [place] [address]   CSF culture     Status: None (Preliminary result)   Collection Time: 08/25/19  1:30 PM   Specimen: PATH Cytology CSF; Cerebrospinal Fluid  Result Value Ref Range Status   Specimen Description   Final    CSF Performed at [place] [address] [place], [place] [address]    Special Requests   Final    NONE Performed at [facility], [place] [address], [place], [place] [address]    Gram Stain   Final    WBC PRESENT, PREDOMINANTLY MONONUCLEAR NO ORGANISMS SEEN Gram Stain Report Called to,Read Back By and Verified With: Sharlyn Bologna RN @1441  ON 3.26.2021 BY [facility] Performed at [place] [address], [place], [place] [address]    Culture   Final    NO GROWTH < 24 HOURS Performed at [place] [address], [place],  [address]    Report Status PENDING  Incomplete     Labs: BNP (last 3 results) No results for input(s): BNP in the last 8760 hours. Basic Metabolic Panel: Recent Labs  Lab 08/25/19 0111 08/26/19 0503  NA 141 144  K 3.7 3.8  CL 104 110  CO2 28 26  GLUCOSE 160* 174*  BUN 14 15  CREATININE 0.88  0.80  CALCIUM 9.4 8.8*  MG  --  2.3   Liver Function Tests: Recent Labs  Lab 08/25/19 0111 08/26/19 0503  AST 24 13*  ALT 28 21  ALKPHOS 143* 107  BILITOT 0.3 0.5  PROT 8.4* 7.3  ALBUMIN 4.3 3.8   Recent Labs  Lab 08/25/19 0111  LIPASE 19   No results for input(s): AMMONIA in the last 168 hours. CBC: Recent Labs  Lab 08/25/19 0111 08/26/19 0503  WBC 13.5* 12.0*  NEUTROABS  --  9.4*  HGB 14.0 12.2  HCT 42.9 36.2  MCV 86.0 86.0  PLT 303 312   Cardiac Enzymes: No results for input(s): CKTOTAL, CKMB, CKMBINDEX, TROPONINI in the last 168 hours. BNP: Invalid input(s): POCBNP CBG: Recent Labs  Lab 08/25/19 1341 08/25/19 1720 08/25/19 2127 08/26/19 0754 08/26/19 1144  GLUCAP 255* 181* 195* 155* 154*   D-Dimer No results for input(s): DDIMER in the last 72 hours. Hgb A1c Recent Labs    08/25/19 0111  HGBA1C 7.6*   Lipid Profile No results for input(s): CHOL, HDL, LDLCALC, TRIG, CHOLHDL, LDLDIRECT in the last 72 hours. Thyroid function studies Recent Labs    08/25/19 0523  TSH 1.821   Anemia work up No results for input(s): VITAMINB12, FOLATE, FERRITIN, TIBC, IRON, RETICCTPCT in the last 72 hours. Urinalysis    Component Value Date/Time   COLORURINE YELLOW (A) 08/25/2019 0109   APPEARANCEUR CLOUDY (A) 08/25/2019 0109   APPEARANCEUR Clear 05/26/2013 1843   LABSPEC 1.013 08/25/2019 0109   LABSPEC 1.018 05/26/2013 1843   PHURINE 7.0 08/25/2019 0109   GLUCOSEU NEGATIVE 08/25/2019 0109   GLUCOSEU >=500 05/26/2013 1843   HGBUR NEGATIVE 08/25/2019 0109   HGBUR trace-intact 07/29/2009 1507   BILIRUBINUR NEGATIVE 08/25/2019 0109   BILIRUBINUR neg 10/13/2017 1608   BILIRUBINUR Negative 05/26/2013 1843   KETONESUR NEGATIVE 08/25/2019 0109   PROTEINUR NEGATIVE 08/25/2019 0109   UROBILINOGEN 0.2 10/13/2017 1608   UROBILINOGEN 0.2 02/07/2011 2005   NITRITE NEGATIVE 08/25/2019 0109   LEUKOCYTESUR LARGE (A) 08/25/2019 0109   LEUKOCYTESUR Negative  05/26/2013 1843   Sepsis Labs Invalid input(s): PROCALCITONIN,  WBC,  LACTICIDVEN Microbiology Recent Results (from the past 240 hour(s))  Culture, Urine     Status: Abnormal (Preliminary result)   Collection Time: 08/25/19  1:09 AM   Specimen: Urine, Clean Catch  Result Value Ref Range Status   Specimen Description   Final    URINE, CLEAN CATCH Performed at [facility], [place] [address], [place], [place] [address]    Special Requests   Final    NONE Performed at [facility], [place] [address], [place], [place] [address]    Culture (A)  Final    >=100,000 COLONIES/mL GRAM NEGATIVE RODS IDENTIFICATION AND SUSCEPTIBILITIES TO FOLLOW Performed at [facility], [place] [address], [place], [place] [address]    Report Status PENDING  Incomplete  SARS CORONAVIRUS 2 (TAT 6-24 HRS) Nasopharyngeal Nasopharyngeal Swab     Status: None   Collection Time: 08/25/19  4:45 AM   Specimen: Nasopharyngeal Swab  Result Value Ref Range Status   SARS Coronavirus 2 NEGATIVE NEGATIVE Final    Comment: (NOTE) SARS-CoV-2 target nucleic acids are NOT DETECTED. The SARS-CoV-2 RNA is generally detectable in upper and lower respiratory specimens during the acute phase of infection. Negative results do not preclude SARS-CoV-2 infection, do not rule out co-infections with other pathogens, and should not be used as the sole basis for treatment or other patient management decisions. Negative results must be combined with clinical observations, patient history, and epidemiological information. The expected result is Negative. Fact Sheet for Patients: SugarRoll.be Fact Sheet for Healthcare Providers: https://www.woods-mathews.com/ This test is not yet approved or cleared by the [place] FDA and  has been authorized for detection and/or diagnosis of SARS-CoV-2 by FDA under an Emergency Use Authorization (EUA). This EUA will  remain  in effect (meaning this test  can be used) for the duration of the COVID-19 declaration under Section 56 4(b)(1) of the Act, 21 U.S.C. section 360bbb-3(b)(1), unless the authorization is terminated or revoked sooner. Performed at [facility], [place] [address], [place], [place] [address]   CSF culture     Status: None (Preliminary result)   Collection Time: 08/25/19  1:30 PM   Specimen: PATH Cytology CSF; Cerebrospinal Fluid  Result Value Ref Range Status   Specimen Description   Final    CSF Performed at [place] [address] [place], [place] [address]    Special Requests   Final    NONE Performed at [facility], [place] [address], [place], [place] [address]    Gram Stain   Final    WBC PRESENT, PREDOMINANTLY MONONUCLEAR NO ORGANISMS SEEN Gram Stain Report Called to,Read Back By and Verified With: Sharlyn Bologna RN @1441  ON 3.26.2021 BY [facility] Performed at [facility], [place] [address] [place], [place] [address]    Culture   Final    NO GROWTH < 24 HOURS Performed at [place] [address] [place], [place] [address]    Report Status PENDING  Incomplete     Time coordinating discharge: 40 minutes  SIGNED:   Elmarie Shiley, MD  [facility]

## 2019-08-27 LAB — URINE CULTURE: Culture: >=100000 — AB

## 2019-08-27 LAB — HSV DNA BY PCR (REFERENCE LAB)
HSV 1 DNA: NEGATIVE
HSV 2 DNA: NEGATIVE

## 2019-08-28 ENCOUNTER — Telehealth: Payer: Self-pay

## 2019-08-28 LAB — URINE DRUGS OF ABUSE SCREEN W ALC, ROUTINE (REF LAB)
Amphetamines, Urine: NEGATIVE ng/mL
Barbiturate, Ur: NEGATIVE ng/mL
Benzodiazepine Quant, Ur: NEGATIVE ng/mL
Cannabinoid Quant, Ur: NEGATIVE ng/mL
Cocaine (Metab.): NEGATIVE ng/mL
Ethanol U, Quan: NEGATIVE %
Methadone Screen, Urine: NEGATIVE ng/mL
Phencyclidine, Ur: NEGATIVE ng/mL
Propoxyphene, Urine: NEGATIVE ng/mL

## 2019-08-28 LAB — OPIATES CONFIRMATION, URINE: OPIATES: NEGATIVE

## 2019-08-28 LAB — PATHOLOGIST SMEAR REVIEW

## 2019-08-28 LAB — CSF CULTURE W GRAM STAIN: Culture: NO GROWTH

## 2019-08-28 NOTE — Telephone Encounter (Signed)
Transition Care Management Follow-up Telephone Call  Date of discharge and from where: 08/26/2019, [facility]   How have you been since you were released from the hospital? Patient states that she is still very weak but feeling better.   Any questions or concerns? No   Items Reviewed:  Did the pt receive and understand the discharge instructions provided? Yes   Medications obtained and verified? Yes   Any new allergies since your discharge? No   Dietary orders reviewed? Yes  Do you have support at home? Yes   Functional Questionnaire: (I = Independent and D = Dependent) ADLs: I  Bathing/Dressing- I  Meal Prep- I  Eating- I  Maintaining continence- I  Transferring/Ambulation- I  Managing Meds- I  Follow up appointments reviewed:   PCP Hospital f/u appt confirmed? Yes  Scheduled to see Dr. Glori Bickers on 09/05/2019 @ 4 pm. Patient states that her appointment has to be after 3:30 pm because that is the only time she can have someone bring her to any appointments.   [facility] f/u appt confirmed? Yes  Scheduled to see neurosurgery   Are transportation arrangements needed? No   If their condition worsens, is the pt aware to call PCP or go to the Emergency Dept.? Yes  Was the patient provided with contact information for the PCP's office or ED? Yes  Was to pt encouraged to call back with questions or concerns? Yes

## 2019-08-30 ENCOUNTER — Telehealth: Payer: Self-pay

## 2019-08-30 NOTE — Telephone Encounter (Signed)
I spoke with pt; pt did not go to ED due to pt started feeling better. Pt said she feels OK today and is planning to keep HFU on 09/05/19. UC & ED precautions given and pt voiced understanding.

## 2019-08-30 NOTE — Telephone Encounter (Signed)
[facility] Night - Client TELEPHONE ADVICE RECORD [facility] Patient Name: Alexa Woods 89 Gender: Female DOB: 02/08/1961 Age: 59 Y 75 M 13 D Return Phone Number: [phone] (Primary) Address: City/State/Zip: [place] [address] Client [facility] Night - Client Client Site [place] [facility], Roque Lias - MD Contact Type Call Who Is Calling Patient / Member / Family / Caregiver Call Type Triage / Clinical Relationship To Patient Self Return Phone Number [phone] (Primary) Chief Complaint Vision - (non urgent symptoms) Reason for Call Symptomatic / Request for Alexa Woods states she is having problems with her vision,when she stands she is not stable Translation No Nurse Assessment Nurse: Chestine Spore, RN, [place] Date/Time (Eastern Time): 08/29/2019 5:36:11 PM Confirm and document reason for call. If symptomatic, describe symptoms. ---caller states having vision changes. dizziness. was in the hospital recently r/t s/s. many tests were done while in the hospital. unknown DX. recheck scheduled for April 6. no temp Has the patient had close contact with a person known or suspected to have the novel coronavirus illness OR traveled / lives in area with major community spread (including international travel) in the last 14 days from the onset of symptoms? * If Asymptomatic, screen for exposure and travel within the last 14 days. ---No Does the patient have any new or worsening symptoms? ---Yes Will a triage be completed? ---Yes Related visit to physician within the last 2 weeks? ---Yes Does the PT have any chronic conditions? (i.e. diabetes, asthma, this includes High risk factors for pregnancy, etc.) ---Yes List chronic conditions. ---asthma. diabetes Is this a behavioral health or substance abuse call? ---No Guidelines Guideline Title Affirmed Question Affirmed  Notes Nurse Date/Time (Eastern Time) Vision Loss or Change [1] Blurred vision or visual changes AND [2] present now AND [3] sudden onset or new (e.g., minutes, hours, days) (Exception: seeing floaters / black specks [name], RN, [place] 08/29/2019 5:41:13 PM PLEASE NOTE: All timestamps contained within this report are represented as [place] Standard Time. CONFIDENTIALTY NOTICE: This fax transmission is intended only for the addressee. It contains information that is legally privileged, confidential or otherwise protected from use or disclosure. If you are not the intended recipient, you are strictly prohibited from reviewing, disclosing, copying using or disseminating any of this information or taking any action in reliance on or regarding this information. If you have received this fax in error, please notify us immediately by telephone so that we can arrange for its return to [place]. Phone: [phone], Toll-Free: [phone], Fax: [phone] Page: 2 of 2 Call Id: [id number] Guidelines Guideline Title Affirmed Question Affirmed Notes Nurse Date/Time [name] Time) OR previously diagnosed migraine headaches with same symptoms) Disp. Time [name] Time) Disposition Final User 08/29/2019 5:47:33 PM Go to ED Now (or PCP triage) Yes Chestine Spore, RN, Silvestre Moment Disagree/Comply Comply Caller Understands Yes PreDisposition Call Doctor Care Advice Given Per Guideline GO TO ED NOW (OR PCP TRIAGE): ANOTHER ADULT SHOULD DRIVE: * It is better and safer if another adult drives instead of you. CARE ADVICE given per Vision Loss or Change (Adult) guideline. Comments User: Alexa Kearns, RN Date/Time [name] Time): 08/29/2019 5:51:19 PM caller states was inpatient at [facility] over the weekend for s/s Referrals GO TO FACILITY UNDECIDED Alexa Woods - ED

## 2019-09-05 ENCOUNTER — Ambulatory Visit (INDEPENDENT_AMBULATORY_CARE_PROVIDER_SITE_OTHER): Payer: No Typology Code available for payment source | Admitting: Family Medicine

## 2019-09-05 ENCOUNTER — Encounter: Payer: Self-pay | Admitting: Family Medicine

## 2019-09-05 ENCOUNTER — Other Ambulatory Visit: Payer: Self-pay

## 2019-09-05 ENCOUNTER — Ambulatory Visit: Payer: No Typology Code available for payment source | Admitting: Family Medicine

## 2019-09-05 VITALS — BP 130/76 | HR 92 | Temp 96.9°F | Wt 243.1 lb

## 2019-09-05 DIAGNOSIS — N390 Urinary tract infection, site not specified: Secondary | ICD-10-CM

## 2019-09-05 DIAGNOSIS — R112 Nausea with vomiting, unspecified: Secondary | ICD-10-CM

## 2019-09-05 DIAGNOSIS — E1142 Type 2 diabetes mellitus with diabetic polyneuropathy: Secondary | ICD-10-CM

## 2019-09-05 DIAGNOSIS — I10 Essential (primary) hypertension: Secondary | ICD-10-CM | POA: Diagnosis not present

## 2019-09-05 DIAGNOSIS — G919 Hydrocephalus, unspecified: Secondary | ICD-10-CM | POA: Diagnosis not present

## 2019-09-05 DIAGNOSIS — R519 Headache, unspecified: Secondary | ICD-10-CM

## 2019-09-05 DIAGNOSIS — H04123 Dry eye syndrome of bilateral lacrimal glands: Secondary | ICD-10-CM | POA: Insufficient documentation

## 2019-09-05 MED ORDER — CYCLOSPORINE 0.05 % OP EMUL: 1.0000 [drp] | Freq: Two times a day (BID) | OPHTHALMIC | 1 refills | Status: DC

## 2019-09-05 NOTE — Assessment & Plan Note (Signed)
bp in fair control at this time  BP Readings from Last 1 Encounters:  09/05/19 130/76   No changes needed Most recent labs reviewed  Disc lifstyle change with low sodium diet and exercise

## 2019-09-05 NOTE — Assessment & Plan Note (Signed)
Hospitalized for this with headache - thought to be related to hydrocephalus on MRI  Reviewed hospital records, lab results and studies in detail   No source found  Controlled initially with anti nausea medication / now better  Diabetic gastroparesis no doubt adds to problem  inst pt to alert [place] if symptoms return

## 2019-09-05 NOTE — Progress Notes (Signed)
Subjective:    Patient ID: Alexa Woods, female    DOB: 01-Nov-1960, 59 y.o.   MRN: [id number]  This visit occurred during the SARS-CoV-2 public health emergency.  Safety protocols were in place, including screening questions prior to the visit, additional usage of staff PPE, and extensive cleaning of exam room while observing appropriate contact time as indicated for disinfecting solutions.    HPI Pt presents for hospital follow up   Wt Readings from Last 3 Encounters:  09/05/19 243 lb 2 oz (110.3 kg)  08/25/19 250 lb (113.4 kg)  07/28/19 253 lb (114.8 kg)   39.24 kg/m   [facility] to 08/26/19 for headache and vomiting for 2 days  Headache was R sided and also occipital with one episode of spot in vision   CT was done and noted age indeterminate lacunar infact or chronic small vessel ischemia in frontal lobe on L  MRI showed some ventricular prominence (LP however showed normal pressure)  A/P hospital course as follows: 1-Intractable Nausea, vomiting Unclear etiology, related to UTI versus neurological mediated. Continue with IV fluids. Tolerating clear, advance diet.  Continue with as needed Zofran.  Pt is no longer vomiting  She is taking protonix  Not on nausea medication now   2-Headache, vision changes MRI with increased ventricle size. Neurology consulted. LP few WBC, no clear cut for meningitis per neurology. Unclear etiology of headaches. Aseptic  meningitis vs migraine. Discussed with Dr Idelle Jo, no need to wait for Herpes CSF.  Patient underwent LP, which showed normal pressure. Neurosurgery has been consulted. Discussed with Dr Arnoldo Morale, with neurosurgery he recommend out patient follow up with him next week for hydrocephalus.   evaluated by ophthalmology. 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 Continue with IV fluids. CT abdomen and pelvis negative. Discharge on 3 days ciprofloxacin.  Urine culture grew E coli. (pan sensitive)   She has chronic dysuria but much better now  Drinking lots of water   4-Diabetes type 2: Continue with sliding scale insulin. Resume home dose lantus.   Per pt in am glucose is in the low 100s  After breakfast 2 hours - usually around 200  Then higher later in the day  Is supposed to f/u in June   5-Hypertension:resume diuretic at discharge.   BP Readings from Last 3 Encounters:  09/05/19 130/76  08/26/19 (!) 153/80  07/28/19 118/60   Pulse Readings from Last 3 Encounters:  09/05/19 92  08/26/19 69  07/28/19 76     6-Hypothyroidism: Continue with Synthroid  Obesity class III BMI more than 40  GERD; Protonixchange to BID.   Recommended check cbc and bmp in one week  CSF test for HSV was pending (these came back negative)  Planned f/u with neurosurgery   Lab Results  Component Value Date   CREATININE 0.80 08/26/2019   BUN 15 08/26/2019   NA 144 08/26/2019   K 3.8 08/26/2019   CL 110 08/26/2019   CO2 26 08/26/2019   Lab Results  Component Value Date   ALT 21 08/26/2019   AST 13 (L) 08/26/2019   ALKPHOS 107 08/26/2019   BILITOT 0.5 08/26/2019    Lab Results  Component Value Date   HGBA1C 7.6 (H) 08/25/2019   Lab Results  Component Value Date  WBC 12.0 (H) 08/26/2019   HGB 12.2 08/26/2019   HCT 36.2 08/26/2019   MCV 86.0 08/26/2019   PLT 312 08/26/2019   MRI report:   MR BRAIN WO CONTRAST (Accession [id number]) (Order [id number]) Imaging Date: 08/25/2019 Department: [facility] Released By: Rayna Sexton, PA-C (auto-released) Authorizing: Vernelle Emerald, MD  Exam Status  Status  Final [99]  PACS Intelerad Image Link  Show images for MR BRAIN WO CONTRAST  Study Result  CLINICAL DATA:  Headache, no history of headache; headache,  acute, normal neuro exam. Additional history provided: Patient with history of obesity, hypertension, diabetes mellitus type 2, gastroesophageal reflux disease and chronic pain syndrome who presents to the emergency department with headache and vomiting.  EXAM: MRI HEAD WITHOUT CONTRAST  TECHNIQUE: Multiplanar, multiecho pulse sequences of the brain and surrounding structures were obtained without intravenous contrast.  COMPARISON:  Head CT 08/25/2019, head CT 05/22/2009  FINDINGS: Brain:  There is no evidence of acute infarct.  No evidence of intracranial mass.  No midline shift or extra-axial fluid collection.  No chronic intracranial blood products.  Mild scattered T2/FLAIR hyperintensity within the cerebral white matter is advanced for age.  Ventricular prominence which has noticeably progressed as compared to prior head CT 05/22/2009.  Cerebral volume is normal for age.  Vascular: Flow voids maintained within the proximal large arterial vessels.  Skull and upper cervical spine: No focal marrow lesion.  Sinuses/Orbits: Visualized orbits demonstrate no acute abnormality. Trace ethmoid sinus mucosal thickening. No significant mastoid effusion.  IMPRESSION: Ventricular prominence which has noticeably progressed as compared to head CT 05/22/2009 and is suspicious for communicating hydrocephalus.  Age advanced scattered T2 hyperintense signal changes within the cerebral white matter. Findings are nonspecific, but likely reflect sequela of chronic small vessel ischemic disease given the provided history of hypertension and diabetes.  No evidence of acute infarction.   Electronically Signed   By: Kellie Simmering DO   On: 08/25/2019 07:23   CT report: CT Head Wo Contrast (Accession [id number]) (Order [id number]) Imaging Date: 08/25/2019 Department: [facility] Released By/Authorizing: Rayna Sexton,  PA-C (auto-released)  Exam Status  Status  Final [99]  PACS Intelerad Image Link  Show images for CT Head Wo Contrast  Study Result  CLINICAL DATA:  Headache  EXAM: CT HEAD WITHOUT CONTRAST  TECHNIQUE: Contiguous axial images were obtained from the base of the skull through the vertex without intravenous contrast.  COMPARISON:  May 22 2009  FINDINGS: Brain: No evidence of acute territorial infarction, hemorrhage, hydrocephalus,extra-axial collection or mass lesion/mass effect. There is dilatation the ventricles and sulci consistent with age-related atrophy. There is a subtle area of periventricular white matter change seen adjacent to the left frontal horn, series 2, image 18.  Vascular: No hyperdense vessel or unexpected calcification.  Skull: The skull is intact. No fracture or focal lesion identified.  Sinuses/Orbits: The visualized paranasal sinuses and mastoid air cells are clear. The orbits and globes intact.  Other: None  IMPRESSION: Findings which could be suggestive of age indeterminate lacunar infarct or chronic small vessel ischemia involving the left frontal lobe.  Findings consistent with age related atrophy   Electronically Signed   By: Prudencio Pair M.D.   On: 08/25/2019 01:38    Today eyes are uncomfortable/ very dry   Some constipation problems - a little better now   Patient Active Problem List   Diagnosis Date Noted  . Hydrocephalus ([place])  09/05/2019  . Intractable headache 08/25/2019  . Intractable nausea and vomiting 08/25/2019  . Hypothyroidism 04/04/2019  . Screening mammogram, encounter for 04/03/2019  . Routine general medical examination at a health care facility 04/03/2019  . Encounter for screening for HIV 04/03/2019  . Encounter for hepatitis C screening test for low risk patient 04/03/2019  . Visit for routine gyn exam 04/03/2019  . Vitamin D deficiency 04/03/2019  . Proximal humerus fracture 02/21/2019   . Intertrigo 02/21/2019  . History of left knee replacement 12/26/2017  . Primary osteoarthritis involving multiple joints 12/13/2017  . Primary osteoarthritis of left knee 12/06/2017  . Chronic neck pain 08/13/2017  . Pre-operative exam 04/05/2017  . Class 3 obesity with serious comorbidity and body mass index (BMI) of 40.0 to 44.9 in adult 01/21/2017  . Dysuria 08/02/2015  . Complicated UTI (urinary tract infection) 05/09/2014  . Joint swelling 12/20/2013  . Periodontal disease 02/03/2013  . Hyperlipidemia associated with type 2 diabetes mellitus ([place]) 09/27/2012  . Hyperlipidemia 09/27/2012  . DYSPHAGIA UNSPECIFIED 08/07/2010  . Low back pain 07/22/2010  . 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  . Type 2 diabetes mellitus with peripheral neuropathy ([place]) 11/30/2006  . History of alcohol abuse 11/30/2006  . Depression with anxiety 11/30/2006  . Essential hypertension 11/30/2006  . HEMORRHOIDS 11/30/2006  . Allergic rhinitis 11/30/2006  . Asthma 11/30/2006  . GERD without esophagitis 11/30/2006  . Psoriasis 11/30/2006   Past Medical History:  Diagnosis Date  . Alcohol abuse, in remission    since 1998  . Allergic rhinitis   . Anxiety   . Asthma   . Bilateral lower extremity edema   . Bulging lumbar disc   . Bulging of cervical intervertebral disc   . Chronic constipation   . DDD (degenerative disc disease), lumbosacral   . Depression   . Dyspnea   . GERD (gastroesophageal reflux disease)   . Hemorrhoids   . History of Bell's palsy 08/2007   left  . History of chronic cystitis   . IBS (irritable bowel syndrome)   . Insulin dependent type 2 diabetes mellitus [facility])    endocrinologist-- dr Cruzita Lederer  . Lower urinary tract symptoms (LUTS)   . OA (osteoarthritis)    knees, back, hands, elbows  . OSA (obstructive sleep apnea)    per study 06-08-2017 mild osa , cpap recommended , per pt insurance issue  . Peripheral neuropathy   . PONV (postoperative nausea and vomiting)    . Psoriasis   . S/P dilatation of esophageal stricture   . Unspecified essential hypertension   . Wears partial dentures    lower   Past Surgical History:  Procedure Laterality Date  . CHOLECYSTECTOMY OPEN  1990   AND APPENDECTOMY  . DOBUTAMINE STRESS ECHO  2009    normal stress echo, no evidence of ischemia  . ELBOW SURGERY Bilateral RIGHT 2013;  LEFT 2016   for nerve damage  . ESOPHAGOGASTRODUODENOSCOPY  07/2002   erythematous gastropathy  . KNEE ARTHROSCOPY Left 11/ 2018   dr Wynelle Link  @ [facility]  . KNEE ARTHROSCOPY W/ PARTIAL MEDIAL MENISCECTOMY Right 10/2008   and chondroplasty  . SHOULDER ARTHROSCOPY WITH DISTAL CLAVICLE RESECTION Left 12/ 2011   dr dean  . TOTAL KNEE ARTHROPLASTY Right 07-01-2009  dr Wynelle Link   [facility]  . TOTAL KNEE ARTHROPLASTY Left 12/06/2017   Procedure: LEFT TOTAL LEFT KNEE ARTHROPLASTY;  Surgeon: Gaynelle Arabian, MD;  Location: [facility];  Service: Orthopedics;  Laterality: Left;  Marland Kitchen VAGINAL HYSTERECTOMY  2003  Social History   Tobacco Use  . Smoking status: Former Smoker    Years: 25.00    Types: Cigarettes    Quit date: 12/30/2001    Years since quitting: 17.6  . Smokeless tobacco: Never Used  Substance Use Topics  . Alcohol use: No    Alcohol/week: 0.0 standard drinks    Comment: hx alcoholism--  stopped 1998   . Drug use: No   Family History  Problem Relation Age of Onset  . Alcohol abuse Mother   . Hypertension Mother   . Esophageal cancer Maternal Grandmother   . Lung cancer Other        mat great uncle  . Colon cancer Neg Hx    Allergies  Allergen Reactions  . Bupropion Hcl Other (See Comments)    Pt is unsure  . Cefuroxime Axetil Nausea Only  . Darvon [Propoxyphene]     wheezing  . Gabapentin     Pt does not remember reaction   . Lidocaine     REACTION: unknown  . Metformin     REACTION: GI  . Paroxetine     REACTION: doesn't agree  . Tramadol Hcl     REACTION: Causes Anxiety  . Codeine Nausea And Vomiting and Rash  . Sulfa  Antibiotics Rash   Current Outpatient Medications on File Prior to Visit  Medication Sig Dispense Refill  . acetaminophen (TYLENOL) 325 MG tablet Take 2 tablets (650 mg total) by mouth every 4 (four) hours as needed for mild pain or headache (or Fever >/= 101). 30 tablet 0  . albuterol (VENTOLIN HFA) 108 (90 Base) MCG/ACT inhaler Inhale 2 puffs into the lungs every 4 (four) hours as needed for wheezing or shortness of breath. 18 g 3  . BD INSULIN SYRINGE U/F 31G X 5/16" 0.5 ML MISC USE THREE TIMES PER DAY WITH R INSULIN & ONCE DAILY WITH LEVEMIR 100 each 11  . beclomethasone (QVAR) 80 MCG/ACT inhaler Inhale 2 puffs 2 (two) times daily into the lungs. Rinse mouth after use. 1 Inhaler 5  . docusate sodium (COLACE) 100 MG capsule Take 100 mg by mouth every morning.     . fluticasone (FLONASE) 50 MCG/ACT nasal spray Place 2 sprays into both nostrils daily as needed for allergies or rhinitis. 16 g 5  . glucose blood (ONE TOUCH ULTRA TEST) test strip Use to test blood sugar 4 times daily as instructed. 300 each 3  . hydrochlorothiazide (HYDRODIURIL) 25 MG tablet TAKE 1 TABLET BY MOUTH EVERY DAY (Patient taking differently: Take 25 mg by mouth daily. ) 90 tablet 3  . insulin glargine (LANTUS) 100 UNIT/ML injection Inject 0.35 mLs (35 Units total) into the skin daily. (Patient taking differently: Inject 30 Units into the skin daily. ) 40 mL 3  . insulin regular (HUMULIN R) 100 units/mL injection INJECT 0.10-0.14 ML'S  INTO THE SKIN THREE TIMES A DAY BEFORE MEALS. (Patient taking differently: Inject 10-15 Units into the skin 3 (three) times daily before meals. INJECT 0.10-0.14 ML'S  INTO THE SKIN THREE TIMES A DAY BEFORE MEALS Per sliding scale) 20 mL 9  . KLOR-CON M10 10 MEQ tablet TAKE 1 TABLET BY MOUTH EVERY DAY (Patient taking differently: Take 10 mEq by mouth daily. ) 90 tablet 1  . Lancets (ONETOUCH ULTRASOFT) lancets Use to test blood sugar 4 times daily as instructed. 200 each 11  . levothyroxine  (SYNTHROID) 75 MCG tablet Take 1 tablet (75 mcg total) by mouth daily. 30 tablet 3  .  LORazepam (ATIVAN) 1 MG tablet Take 2 mg by mouth 2 (two) times daily as needed for anxiety or sleep.     Marland Kitchen oxyCODONE-acetaminophen (PERCOCET) 10-325 MG tablet Take 1 tablet by mouth every 4 (four) hours as needed for pain. Hold for SBP < = 105 12 tablet 0  . pantoprazole (PROTONIX) 40 MG tablet Take 1 tablet (40 mg total) by mouth 2 (two) times daily. 60 tablet 3  . polyethylene glycol (MIRALAX / GLYCOLAX) packet Take 17 g by mouth daily as needed for mild constipation or moderate constipation.     . senna (SENOKOT) 8.6 MG TABS tablet Take 1 tablet by mouth daily.     . sertraline (ZOLOFT) 100 MG tablet Take 200 mg by mouth every morning.     Marland Kitchen tiZANidine (ZANAFLEX) 4 MG tablet Take 4 mg by mouth 2 (two) times daily as needed for muscle spasms.     Marland Kitchen triamcinolone cream (KENALOG) 0.1 % Apply 1 application topically 2 (two) times daily. To psoriasis areas 30 g 0  . UNABLE TO FIND Diet type : NCS    . zolpidem (AMBIEN) 10 MG tablet Take 10 mg by mouth at bedtime as needed for sleep.      No current facility-administered medications on file prior to visit.     Review of Systems  Constitutional: Positive for fatigue. Negative for activity change, appetite change, fever and unexpected weight change.  HENT: Negative for congestion, ear pain, rhinorrhea, sinus pressure and sore throat.   Eyes: Negative for pain, redness and visual disturbance.  Respiratory: Negative for cough, shortness of breath and wheezing.   Cardiovascular: Negative for chest pain, palpitations and leg swelling.  Gastrointestinal: Negative for abdominal pain, blood in stool, constipation, diarrhea, nausea and vomiting.       Nausea and vomiting are resolved  Endocrine: Negative for polydipsia and polyuria.  Genitourinary: Positive for frequency. Negative for dysuria and urgency.       Baseline frequency  Dysuria is better   Musculoskeletal:  Negative for arthralgias, back pain and myalgias.  Skin: Negative for pallor and rash.  Allergic/Immunologic: Negative for environmental allergies.  Neurological: Positive for headaches. Negative for dizziness, syncope, speech difficulty, weakness and light-headedness.       Feels like she has lost endurance but no focal weakness  Hematological: Negative for adenopathy. Does not bruise/bleed easily.  Psychiatric/Behavioral: Negative for decreased concentration and dysphoric mood. The patient is not nervous/anxious.        Objective:   Physical Exam Constitutional:      General: She is not in acute distress.    Appearance: Normal appearance. She is well-developed. She is obese. She is not ill-appearing or diaphoretic.  HENT:     Head: Normocephalic and atraumatic.     Right Ear: External ear normal.     Left Ear: External ear normal.     Nose: Nose normal.     Mouth/Throat:     Mouth: Mucous membranes are moist.     Pharynx: No oropharyngeal exudate.  Eyes:     General: No scleral icterus.       Right eye: No discharge.        Left eye: No discharge.     Conjunctiva/sclera: Conjunctivae normal.     Pupils: Pupils are equal, round, and reactive to light.     Comments: No nystagmus  Neck:     Thyroid: No thyromegaly.     Vascular: No carotid bruit or JVD.  Trachea: No tracheal deviation.  Cardiovascular:     Rate and Rhythm: Regular rhythm. Tachycardia present.     Heart sounds: Normal heart sounds. No murmur.  Pulmonary:     Effort: Pulmonary effort is normal. No respiratory distress.     Breath sounds: Normal breath sounds. No wheezing or rales.  Abdominal:     General: Bowel sounds are normal. There is no distension.     Palpations: Abdomen is soft. There is no mass.     Tenderness: There is no abdominal tenderness. There is no right CVA tenderness, left CVA tenderness, guarding or rebound.  Musculoskeletal:        General: No tenderness.     Cervical back: Full  passive range of motion without pain, normal range of motion and neck supple. No rigidity or tenderness.     Right lower leg: No edema.     Left lower leg: No edema.  Lymphadenopathy:     Cervical: No cervical adenopathy.  Skin:    General: Skin is warm and dry.     Coloration: Skin is not pale.     Findings: No erythema or rash.  Neurological:     Mental Status: She is alert and oriented to person, place, and time.     Cranial Nerves: No cranial nerve deficit.     Sensory: No sensory deficit.     Motor: No tremor, atrophy or abnormal muscle tone.     Coordination: Coordination normal.     Gait: Gait normal.     Deep Tendon Reflexes: Reflexes are normal and symmetric. Reflexes normal.     Comments: No focal cerebellar signs   Psychiatric:        Behavior: Behavior normal.        Thought Content: Thought content normal.           Assessment & Plan:   Problem List Items Addressed This Visit      Cardiovascular and Mediastinum   Essential hypertension    bp in fair control at this time  BP Readings from Last 1 Encounters:  09/05/19 130/76   No changes needed Most recent labs reviewed  Disc lifstyle change with low sodium diet and exercise        Relevant Orders   Basic metabolic panel   CBC with Differential/Platelet     Digestive   Intractable nausea and vomiting    Hospitalized for this with headache - thought to be related to hydrocephalus on MRI  Reviewed hospital records, lab results and studies in detail   No source found  Controlled initially with anti nausea medication / now better  Diabetic gastroparesis no doubt adds to problem  inst pt to alert [place] if symptoms return         Endocrine   Type 2 diabetes mellitus with peripheral neuropathy ([place])    Pt is due for f/u with endocrinology in June  Glucose levels have been labile after recent hospitalization  Urged pt to reach out to Dr Cruzita Lederer She also needs to find out what meter and strips are  covered by her insurance        Nervous and Auditory   Hydrocephalus ([place]) - Primary    Found on MRI in pt admitted to hospital for n/v and headache  Neg w/u and nl pressure on LP Reviewed hospital records, lab results and studies in detail   Pt is clinically improved  Reviewed cultures  She was advised to f/u with Dr Arnoldo Morale (  neurosurg) outpt a week after discharge- but there may be an insurance issue with this  [facility] will work on other possibilities if this does not work out  Reassuring exam today       Relevant Orders   Ambulatory referral to Neurosurgery     Genitourinary   Complicated UTI (urinary tract infection)    S/p hospitalization  E coli, pan sensitive tx with cipro  Clinically improved  Cbc and bmet done today        Other   Intractable headache    With diagnosis of hydrocephalus in hospital  Reviewed hospital records, lab results and studies in detail  Clinically improved Needs to f/u with neurosurgeon -working on that

## 2019-09-05 NOTE — Assessment & Plan Note (Signed)
With diagnosis of hydrocephalus in hospital  Reviewed hospital records, lab results and studies in detail  Clinically improved Needs to f/u with neurosurgeon -working on that

## 2019-09-05 NOTE — Assessment & Plan Note (Signed)
With h/o psoriasis  Pt does have known diabetic retinopathy  Px restasis to try if affordable  Enc use of lubricant drops also

## 2019-09-05 NOTE — Patient Instructions (Addendum)
Call your pharmacy and see what the preferred test strip is for your insurance and medicare - that is more affordable  ? New meter as well   Try miralax for your constipation (store brand)   Re check labs for chemistries and blood count today   Eat small amounts and increase activity gradually   I will place an urgent referral to neurosurg to get re check

## 2019-09-05 NOTE — Assessment & Plan Note (Signed)
Found on MRI in pt admitted to hospital for n/v and headache  Neg w/u and nl pressure on LP Reviewed hospital records, lab results and studies in detail   Pt is clinically improved  Reviewed cultures  She was advised to f/u with Dr Arnoldo Morale (neurosurg) outpt a week after discharge- but there may be an insurance issue with this  [facility] will work on other possibilities if this does not work out  Stage manager exam today

## 2019-09-05 NOTE — Assessment & Plan Note (Signed)
S/p hospitalization  E coli, pan sensitive tx with cipro  Clinically improved  Cbc and bmet done today

## 2019-09-05 NOTE — Assessment & Plan Note (Signed)
Pt is due for f/u with endocrinology in June  Glucose levels have been labile after recent hospitalization  Urged pt to reach out to Dr Cruzita Lederer She also needs to find out what meter and strips are covered by her insurance

## 2019-09-05 NOTE — Assessment & Plan Note (Signed)
Pt has lost some wt with recent illness Appetite is not back

## 2019-09-06 ENCOUNTER — Telehealth: Payer: Self-pay | Admitting: *Deleted

## 2019-09-06 ENCOUNTER — Telehealth: Payer: Self-pay

## 2019-09-06 LAB — CBC WITH DIFFERENTIAL/PLATELET
Basophils Absolute: 0.1 10*3/uL (ref 0.0–0.1)
Basophils Relative: 0.4 % (ref 0.0–3.0)
Eosinophils Absolute: 0.1 10*3/uL (ref 0.0–0.7)
Eosinophils Relative: 0.7 % (ref 0.0–5.0)
HCT: 40.3 % (ref 36.0–46.0)
Hemoglobin: 13.8 g/dL (ref 12.0–15.0)
Lymphocytes Relative: 14.2 % (ref 12.0–46.0)
Lymphs Abs: 2 10*3/uL (ref 0.7–4.0)
MCHC: 34.3 g/dL (ref 30.0–36.0)
MCV: 83.8 fl (ref 78.0–100.0)
Monocytes Absolute: 0.9 10*3/uL (ref 0.1–1.0)
Monocytes Relative: 6.2 % (ref 3.0–12.0)
Neutro Abs: 10.9 10*3/uL — ABNORMAL HIGH (ref 1.4–7.7)
Neutrophils Relative %: 78.5 % — ABNORMAL HIGH (ref 43.0–77.0)
Platelets: 317 10*3/uL (ref 150.0–400.0)
RBC: 4.82 Mil/uL (ref 3.87–5.11)
RDW: 13.4 % (ref 11.5–15.5)
WBC: 14 10*3/uL — ABNORMAL HIGH (ref 4.0–10.5)

## 2019-09-06 LAB — BASIC METABOLIC PANEL
BUN: 19 mg/dL (ref 6–23)
CO2: 28 mEq/L (ref 19–32)
Calcium: 9.5 mg/dL (ref 8.4–10.5)
Chloride: 99 mEq/L (ref 96–112)
Creatinine, Ser: 1.13 mg/dL (ref 0.40–1.20)
GFR: 49.72 mL/min — ABNORMAL LOW (ref >60.00)
Glucose, Bld: 197 mg/dL — ABNORMAL HIGH (ref 70–99)
Potassium: 3.7 mEq/L (ref 3.5–5.1)
Sodium: 138 mEq/L (ref 135–145)

## 2019-09-06 NOTE — Telephone Encounter (Signed)
PA for restasis done at [email or web address], I will await a response

## 2019-09-06 NOTE — Telephone Encounter (Signed)
It was from [place] our [facility], she has already attempted to call pt back today

## 2019-09-06 NOTE — Telephone Encounter (Signed)
Pt had visit with Dr Glori Bickers on 09/05/19.

## 2019-09-06 NOTE — Telephone Encounter (Signed)
Alexa Woods - Client Nonclinical Telephone Record AccessNurse Client Alexa Woods - Client Client Site [place], Roque Lias - MD Contact Type Call Who Is Calling Patient / Member / Family / Caregiver Caller Name Alexa Woods Caller Phone Number [phone] Call Type Message Only Information Provided Reason for Call Returning a Call from the Office Initial Alexa Woods states she missed call from office Additional Comment Disp. Time Disposition Final User 09/05/2019 6:30:12 PM General Information Provided Yes Ignacia Marvel Call Closed By: Ignacia Marvel Transaction Date/Time: 09/05/2019 6:28:45 PM (ET)

## 2019-09-07 ENCOUNTER — Telehealth: Payer: Self-pay | Admitting: *Deleted

## 2019-09-07 ENCOUNTER — Other Ambulatory Visit (INDEPENDENT_AMBULATORY_CARE_PROVIDER_SITE_OTHER): Payer: No Typology Code available for payment source

## 2019-09-07 ENCOUNTER — Encounter: Payer: Self-pay | Admitting: *Deleted

## 2019-09-07 ENCOUNTER — Encounter: Payer: Self-pay | Admitting: Family Medicine

## 2019-09-07 DIAGNOSIS — D72829 Elevated white blood cell count, unspecified: Secondary | ICD-10-CM | POA: Diagnosis not present

## 2019-09-07 DIAGNOSIS — Z87440 Personal history of urinary (tract) infections: Secondary | ICD-10-CM

## 2019-09-07 LAB — POC URINALSYSI DIPSTICK (AUTOMATED)
Bilirubin, UA: NEGATIVE
Blood, UA: 1
Glucose, UA: POSITIVE — AB
Ketones, UA: NEGATIVE
Leukocytes, UA: NEGATIVE
Nitrite, UA: NEGATIVE
Protein, UA: NEGATIVE
Spec Grav, UA: 1.02 (ref 1.010–1.025)
Urobilinogen, UA: 0.2 E.U./dL
pH, UA: 6 (ref 5.0–8.0)

## 2019-09-07 NOTE — Telephone Encounter (Signed)
Addressed through lab results  

## 2019-09-07 NOTE — Telephone Encounter (Signed)
Left VM requesting pt to call the office back regarding lab results  

## 2019-09-08 LAB — URINE CULTURE
MICRO NUMBER:: 10341911
Result:: NO GROWTH
SPECIMEN QUALITY:: ADEQUATE

## 2019-09-11 ENCOUNTER — Telehealth: Payer: Self-pay | Admitting: *Deleted

## 2019-09-11 NOTE — Telephone Encounter (Signed)
Left VM requesting pt to call the office back regarding urine cx results  

## 2019-09-12 ENCOUNTER — Other Ambulatory Visit: Payer: Self-pay | Admitting: Internal Medicine

## 2019-09-13 ENCOUNTER — Encounter: Payer: Self-pay | Admitting: *Deleted

## 2019-09-14 ENCOUNTER — Telehealth: Payer: Self-pay | Admitting: Family Medicine

## 2019-09-14 NOTE — Telephone Encounter (Signed)
Aware, thanks - I saw the note and I hope she is feeling better!

## 2019-09-14 NOTE — Telephone Encounter (Signed)
Pt called because she wanted to update you on her visit with the neurosurgeon. She saw him on Tuesday, 4/13, and he said everything was fine.

## 2019-10-02 ENCOUNTER — Other Ambulatory Visit: Payer: Self-pay | Admitting: Family Medicine

## 2019-10-02 ENCOUNTER — Telehealth: Payer: Self-pay | Admitting: *Deleted

## 2019-10-02 NOTE — Telephone Encounter (Signed)
Patient called stating that she just found out her son's roommate tested positive for covid at the hospital yesterday. Patient stated that her son came by her house after dropping his friend off at the hospital. Patient stated that she had not seen her son for several months until yesterday, Patient stated that she has had some cold like symptoms, but they started back before the possible exposure. Patient wants to know how long she should wait to be tested? Patient stated that she and her son were not wearing mask yesterday.

## 2019-10-02 NOTE — Telephone Encounter (Signed)
Pt notified of Dr. Marliss Coots comments and verbalized understanding. covid testing info given to pt, she will have son get tested and her as well if need be

## 2019-10-02 NOTE — Telephone Encounter (Signed)
Her son needs to get tested.  If he is positive she needs to be tested also (not for 5 days from exposure however)  If her symptoms continue please schedule a virtual visit

## 2019-10-03 ENCOUNTER — Telehealth: Payer: Self-pay

## 2019-10-03 NOTE — Telephone Encounter (Signed)
Error see 10/02/19 phonenote.

## 2019-10-03 NOTE — Telephone Encounter (Signed)
Pt calling and wants to know if she needs to be tested since she was exposed in her home by her son on 10/01/19. Pt said she was not masked and no social distancing. Pt does not have covid symptoms and no traveling. Pt is going to contact her son to see if he has gone to get covid testing yet. Pt notified as instructed from 10/02/19 note from Dr Glori Bickers. Pt will cb if needed.FYI to [place] CMA.

## 2019-10-04 NOTE — Telephone Encounter (Signed)
Thanks, please keep me posted

## 2019-10-04 NOTE — Telephone Encounter (Signed)
Pt spoke with her son last night and wanted  to let Dr Glori Bickers know that her son has not been tested yet for covid. Pt is going to try to call her son later tonight to find out if he has been covid tested. Pt will cb with info. FYI to Dr Denice Bors and Rexene Agent CMA.

## 2019-10-05 ENCOUNTER — Encounter (HOSPITAL_COMMUNITY): Payer: Self-pay

## 2019-10-05 ENCOUNTER — Other Ambulatory Visit: Payer: Self-pay

## 2019-10-05 ENCOUNTER — Emergency Department (HOSPITAL_COMMUNITY)
Admission: EM | Admit: 2019-10-05 | Discharge: 2019-10-05 | Disposition: A | Discharge status: Home / Self Care | Payer: No Typology Code available for payment source | Attending: Emergency Medicine | Admitting: Emergency Medicine

## 2019-10-05 ENCOUNTER — Emergency Department (HOSPITAL_COMMUNITY): Payer: No Typology Code available for payment source

## 2019-10-05 DIAGNOSIS — Z87891 Personal history of nicotine dependence: Secondary | ICD-10-CM | POA: Diagnosis not present

## 2019-10-05 DIAGNOSIS — I10 Essential (primary) hypertension: Secondary | ICD-10-CM | POA: Diagnosis not present

## 2019-10-05 DIAGNOSIS — E039 Hypothyroidism, unspecified: Secondary | ICD-10-CM | POA: Insufficient documentation

## 2019-10-05 DIAGNOSIS — J4540 Moderate persistent asthma, uncomplicated: Secondary | ICD-10-CM | POA: Diagnosis not present

## 2019-10-05 DIAGNOSIS — J45909 Unspecified asthma, uncomplicated: Secondary | ICD-10-CM | POA: Insufficient documentation

## 2019-10-05 DIAGNOSIS — Z794 Long term (current) use of insulin: Secondary | ICD-10-CM | POA: Insufficient documentation

## 2019-10-05 DIAGNOSIS — Z79899 Other long term (current) drug therapy: Secondary | ICD-10-CM | POA: Insufficient documentation

## 2019-10-05 DIAGNOSIS — R0981 Nasal congestion: Secondary | ICD-10-CM | POA: Diagnosis present

## 2019-10-05 DIAGNOSIS — Z20822 Contact with and (suspected) exposure to covid-19: Secondary | ICD-10-CM | POA: Diagnosis not present

## 2019-10-05 DIAGNOSIS — E119 Type 2 diabetes mellitus without complications: Secondary | ICD-10-CM | POA: Insufficient documentation

## 2019-10-05 LAB — CBC WITH DIFFERENTIAL/PLATELET
Abs Immature Granulocytes: 0.02 10*3/uL (ref 0.00–0.07)
Basophils Absolute: 0.1 10*3/uL (ref 0.0–0.1)
Basophils Relative: 1 %
Eosinophils Absolute: 0.1 10*3/uL (ref 0.0–0.5)
Eosinophils Relative: 1 %
HCT: 42.1 % (ref 36.0–46.0)
Hemoglobin: 13.6 g/dL (ref 12.0–15.0)
Immature Granulocytes: 0 %
Lymphocytes Relative: 27 %
Lymphs Abs: 1.9 10*3/uL (ref 0.7–4.0)
MCH: 28 pg (ref 26.0–34.0)
MCHC: 32.3 g/dL (ref 30.0–36.0)
MCV: 86.6 fL (ref 80.0–100.0)
Monocytes Absolute: 0.5 10*3/uL (ref 0.1–1.0)
Monocytes Relative: 7 %
Neutro Abs: 4.5 10*3/uL (ref 1.7–7.7)
Neutrophils Relative %: 64 %
Platelets: 247 10*3/uL (ref 150–400)
RBC: 4.86 MIL/uL (ref 3.87–5.11)
RDW: 13.1 % (ref 11.5–15.5)
WBC: 7.1 10*3/uL (ref 4.0–10.5)
nRBC: 0 % (ref 0.0–0.2)

## 2019-10-05 LAB — COMPREHENSIVE METABOLIC PANEL
ALT: 34 U/L (ref 0–44)
AST: 32 U/L (ref 15–41)
Albumin: 4.2 g/dL (ref 3.5–5.0)
Alkaline Phosphatase: 143 U/L — ABNORMAL HIGH (ref 38–126)
Anion gap: 10 (ref 5–15)
BUN: 18 mg/dL (ref 6–20)
CO2: 31 mmol/L (ref 22–32)
Calcium: 9.3 mg/dL (ref 8.9–10.3)
Chloride: 100 mmol/L (ref 98–111)
Creatinine, Ser: 0.99 mg/dL (ref 0.44–1.00)
GFR calc Af Amer: >60 mL/min (ref >60)
GFR calc non Af Amer: >60 mL/min (ref >60)
Glucose, Bld: 132 mg/dL — ABNORMAL HIGH (ref 70–99)
Potassium: 3.8 mmol/L (ref 3.5–5.1)
Sodium: 141 mmol/L (ref 135–145)
Total Bilirubin: 0.5 mg/dL (ref 0.3–1.2)
Total Protein: 7.9 g/dL (ref 6.5–8.1)

## 2019-10-05 LAB — RESPIRATORY PANEL BY RT PCR (FLU A&B, COVID)
Influenza A by PCR: NEGATIVE
Influenza B by PCR: NEGATIVE
SARS Coronavirus 2 by RT PCR: NEGATIVE

## 2019-10-05 MED ORDER — PREDNISONE 20 MG PO TABS
40.0000 mg | ORAL_TABLET | Freq: Every day | ORAL | 0 refills | Status: DC
Start: 2019-10-05 — End: 2020-06-06

## 2019-10-05 NOTE — ED Provider Notes (Signed)
[facility] DEPT Provider Note   CSN: [id number] Arrival date & time: 10/05/19  1927     History Chief Complaint  Patient presents with  . Covid Exposure    Alexa Woods is a 60 y.o. female.  Patient is a 59 year old female with a history of asthma, diabetes that is insulin-dependent, GERD who is presenting today with complaint of nasal congestion, sneezing, itchy eyes, nonproductive cough and intermittent chest tightness and shortness of breath.  Patient reports the symptoms started yesterday and seem to worsen today.  They are improved with her inhaler.  She has not had fever.  She did have possible Covid contact with her son.  Her son is asymptomatic but his roommates have tested positive for Covid and one is hospitalized.  He then came to her home.  He remains asymptomatic but she was concerned that she could have Covid.  She does report intermittent palpitations but none currently.  She is supposed to take allergy medication but has not been on it recently.  She denies any tobacco use or significant alcohol use at this time.  The history is provided by the patient.       Past Medical History:  Diagnosis Date  . Alcohol abuse, in remission    since 1998  . Allergic rhinitis   . Anxiety   . Asthma   . Bilateral lower extremity edema   . Bulging lumbar disc   . Bulging of cervical intervertebral disc   . Chronic constipation   . DDD (degenerative disc disease), lumbosacral   . Depression   . Dyspnea   . GERD (gastroesophageal reflux disease)   . Hemorrhoids   . History of Bell's palsy 08/2007   left  . History of chronic cystitis   . IBS (irritable bowel syndrome)   . Insulin dependent type 2 diabetes mellitus [facility])    endocrinologist-- dr Cruzita Lederer  . Lower urinary tract symptoms (LUTS)   . OA (osteoarthritis)    knees, back, hands, elbows  . OSA (obstructive sleep apnea)    per study 06-08-2017 mild osa , cpap recommended , per pt  insurance issue  . Peripheral neuropathy   . PONV (postoperative nausea and vomiting)   . Psoriasis   . S/P dilatation of esophageal stricture   . Unspecified essential hypertension   . Wears partial dentures    lower    Patient Active Problem List   Diagnosis Date Noted  . Hydrocephalus ([place]) 09/05/2019  . Dry eyes 09/05/2019  . Intractable headache 08/25/2019  . Intractable nausea and vomiting 08/25/2019  . Hypothyroidism 04/04/2019  . Screening mammogram, encounter for 04/03/2019  . Routine general medical examination at a health care facility 04/03/2019  . Encounter for screening for HIV 04/03/2019  . Encounter for hepatitis C screening test for low risk patient 04/03/2019  . Visit for routine gyn exam 04/03/2019  . Vitamin D deficiency 04/03/2019  . Proximal humerus fracture 02/21/2019  . Intertrigo 02/21/2019  . History of left knee replacement 12/26/2017  . Primary osteoarthritis involving multiple joints 12/13/2017  . Primary osteoarthritis of left knee 12/06/2017  . Chronic neck pain 08/13/2017  . Pre-operative exam 04/05/2017  . Class 3 obesity with serious comorbidity and body mass index (BMI) of 40.0 to 44.9 in adult 01/21/2017  . Dysuria 08/02/2015  . Complicated UTI (urinary tract infection) 05/09/2014  . Joint swelling 12/20/2013  . Periodontal disease 02/03/2013  . Hyperlipidemia associated with type 2 diabetes mellitus ([place]) 09/27/2012  .  Hyperlipidemia 09/27/2012  . DYSPHAGIA UNSPECIFIED 08/07/2010  . Low back pain 07/22/2010  . 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  . Type 2 diabetes mellitus with peripheral neuropathy ([place]) 11/30/2006  . History of alcohol abuse 11/30/2006  . Depression with anxiety 11/30/2006  . Essential hypertension 11/30/2006  . HEMORRHOIDS 11/30/2006  . Allergic rhinitis 11/30/2006  . Asthma 11/30/2006  . GERD without esophagitis 11/30/2006  . Psoriasis 11/30/2006    Past Surgical History:  Procedure Laterality Date  .  CHOLECYSTECTOMY OPEN  1990   AND APPENDECTOMY  . DOBUTAMINE STRESS ECHO  2009    normal stress echo, no evidence of ischemia  . ELBOW SURGERY Bilateral RIGHT 2013;  LEFT 2016   for nerve damage  . ESOPHAGOGASTRODUODENOSCOPY  07/2002   erythematous gastropathy  . KNEE ARTHROSCOPY Left 11/ 2018   dr Wynelle Link  @ [facility]  . KNEE ARTHROSCOPY W/ PARTIAL MEDIAL MENISCECTOMY Right 10/2008   and chondroplasty  . SHOULDER ARTHROSCOPY WITH DISTAL CLAVICLE RESECTION Left 12/ 2011   dr dean  . TOTAL KNEE ARTHROPLASTY Right 07-01-2009  dr Wynelle Link   [facility]  . TOTAL KNEE ARTHROPLASTY Left 12/06/2017   Procedure: LEFT TOTAL LEFT KNEE ARTHROPLASTY;  Surgeon: Gaynelle Arabian, MD;  Location: [facility];  Service: Orthopedics;  Laterality: Left;  Marland Kitchen VAGINAL HYSTERECTOMY  2003     OB History   No obstetric history on file.     Family History  Problem Relation Age of Onset  . Alcohol abuse Mother   . Hypertension Mother   . Esophageal cancer Maternal Grandmother   . Lung cancer Other        mat great uncle  . Colon cancer Neg Hx     Social History   Tobacco Use  . Smoking status: Former Smoker    Years: 25.00    Types: Cigarettes    Quit date: 12/30/2001    Years since quitting: 17.7  . Smokeless tobacco: Never Used  Substance Use Topics  . Alcohol use: No    Alcohol/week: 0.0 standard drinks    Comment: hx alcoholism--  stopped 1998   . Drug use: No    Home Medications Prior to Admission medications   Medication Sig Start Date End Date Taking? Authorizing Provider  acetaminophen (TYLENOL) 325 MG tablet Take 2 tablets (650 mg total) by mouth every 4 (four) hours as needed for mild pain or headache (or Fever >/= 101). 08/26/19   Regalado, Belkys A, MD  albuterol (VENTOLIN HFA) 108 (90 Base) MCG/ACT inhaler Inhale 2 puffs into the lungs every 4 (four) hours as needed for wheezing or shortness of breath. 02/21/19   Tower, Wynelle Fanny, MD  BD INSULIN SYRINGE U/F 31G X 5/16" 0.5 ML MISC USE THREE TIMES PER DAY WITH  R INSULIN & ONCE DAILY WITH LEVEMIR 06/19/19   [name], MD  beclomethasone (QVAR) 80 MCG/ACT inhaler Inhale 2 puffs 2 (two) times daily into the lungs. Rinse mouth after use. 04/08/17   Laverle Hobby, MD  cycloSPORINE (RESTASIS) 0.05 % ophthalmic emulsion Place 1 drop into both eyes 2 (two) times daily. 09/05/19   Tower, Wynelle Fanny, MD  docusate sodium (COLACE) 100 MG capsule Take 100 mg by mouth every morning.     [provider]  fluticasone (FLONASE) 50 MCG/ACT nasal spray Place 2 sprays into both nostrils daily as needed for allergies or rhinitis. 02/21/19   Tower, Wynelle Fanny, MD  hydrochlorothiazide (HYDRODIURIL) 25 MG tablet TAKE 1 TABLET BY MOUTH EVERY DAY Patient taking differently: Take  25 mg by mouth daily.  05/20/19   Tower, Wynelle Fanny, MD  insulin glargine (LANTUS) 100 UNIT/ML injection Inject 0.35 mLs (35 Units total) into the skin daily. Patient taking differently: Inject 30 Units into the skin daily.  07/28/19   [name], MD  insulin regular (HUMULIN R) 100 units/mL injection INJECT 0.10-0.14 ML'S  INTO THE SKIN THREE TIMES A DAY BEFORE MEALS. Patient taking differently: Inject 10-15 Units into the skin 3 (three) times daily before meals. INJECT 0.10-0.14 ML'S  INTO THE SKIN THREE TIMES A DAY BEFORE MEALS Per sliding scale 07/28/19   [name], MD  KLOR-CON M10 10 MEQ tablet TAKE 1 TABLET BY MOUTH EVERY DAY Patient taking differently: Take 10 mEq by mouth daily.  05/16/19   Tower, Wynelle Fanny, MD  Lancets [facility] ULTRASOFT) lancets Use to test blood sugar 4 times daily as instructed. 10/04/18   [name], MD  levothyroxine (SYNTHROID) 75 MCG tablet Take 1 tablet (75 mcg total) by mouth daily before breakfast. 10/02/19   Tower, Wynelle Fanny, MD  LORazepam (ATIVAN) 1 MG tablet Take 2 mg by mouth 2 (two) times daily as needed for anxiety or sleep.     [provider]  [facility] ULTRA test strip USE TO TEST BLOOD SUGAR 4 TIMES DAILY AS INSTRUCTED. 09/12/19    [name], MD  oxyCODONE-acetaminophen (PERCOCET) 10-325 MG tablet Take 1 tablet by mouth every 4 (four) hours as needed for pain. Hold for SBP < = 105 12/15/17   Medina-Vargas, Monina C, NP  pantoprazole (PROTONIX) 40 MG tablet Take 1 tablet (40 mg total) by mouth 2 (two) times daily. 08/26/19   Regalado, Belkys A, MD  polyethylene glycol (MIRALAX / GLYCOLAX) packet Take 17 g by mouth daily as needed for mild constipation or moderate constipation.     [provider]  predniSONE (DELTASONE) 20 MG tablet Take 2 tablets (40 mg total) by mouth daily. Only start if your breathing is not improving with your inhaler 10/05/19   Blanchie Dessert, MD  senna (SENOKOT) 8.6 MG TABS tablet Take 1 tablet by mouth daily.     [provider]  sertraline (ZOLOFT) 100 MG tablet Take 200 mg by mouth every morning.     [provider]  tiZANidine (ZANAFLEX) 4 MG tablet Take 4 mg by mouth 2 (two) times daily as needed for muscle spasms.  07/10/14   [provider]  triamcinolone cream (KENALOG) 0.1 % Apply 1 application topically 2 (two) times daily. To psoriasis areas 08/22/18   Tower, Wynelle Fanny, MD  UNABLE TO FIND Diet type : NCS    [provider]  zolpidem (AMBIEN) 10 MG tablet Take 10 mg by mouth at bedtime as needed for sleep.     [provider]    Allergies    Bupropion, Bupropion hcl, Cefuroxime axetil, Gabapentin, Lidocaine, Metformin, Other, Paroxetine, Propoxyphene, Tramadol, Tramadol hcl, Codeine, and Sulfa antibiotics  Review of Systems   Review of Systems  All other systems reviewed and are negative.   Physical Exam Updated Vital Signs BP (!) 145/84 (BP Location: Left Arm)   Pulse 81   Temp 98.4 F (36.9 C) (Oral)   Resp 19   Ht 5\' 6"  (1.676 m)   Wt 113.4 kg   SpO2 96%   BMI 40.35 kg/m   Physical Exam Vitals and nursing note reviewed.  Constitutional:      General: She is not in acute distress.    Appearance: Normal appearance.  She  is well-developed.  HENT:     Head: Normocephalic and atraumatic.     Right Ear: Tympanic membrane normal.     Left Ear: A middle ear effusion is present.     Nose:     Right Turbinates: Swollen.     Left Turbinates: Swollen.     Mouth/Throat:     Mouth: Mucous membranes are moist.     Comments: Cobblestoning in the back of the throat from drainage Eyes:     Pupils: Pupils are equal, round, and reactive to light.  Cardiovascular:     Rate and Rhythm: Normal rate and regular rhythm.     Heart sounds: Normal heart sounds. No murmur. No friction rub.  Pulmonary:     Effort: Pulmonary effort is normal.     Breath sounds: Normal breath sounds. No wheezing or rales.  Abdominal:     General: Bowel sounds are normal. There is no distension.     Palpations: Abdomen is soft.     Tenderness: There is no abdominal tenderness. There is no guarding or rebound.  Musculoskeletal:        General: No tenderness. Normal range of motion.     Comments: No edema  Skin:    General: Skin is warm and dry.     Findings: No rash.  Neurological:     General: No focal deficit present.     Mental Status: She is alert and oriented to person, place, and time. Mental status is at baseline.     Cranial Nerves: No cranial nerve deficit.  Psychiatric:        Mood and Affect: Mood normal.        Behavior: Behavior normal.        Thought Content: Thought content normal.      ED Results / Procedures / Treatments   Labs (all labs ordered are listed, but only abnormal results are displayed) Labs Reviewed  COMPREHENSIVE METABOLIC PANEL - Abnormal; Notable for the following components:      Result Value   Glucose, Bld 132 (*)    Alkaline Phosphatase 143 (*)    All other components within normal limits  RESPIRATORY PANEL BY RT PCR (FLU A&B, COVID)  CBC WITH DIFFERENTIAL/PLATELET    EKG EKG Interpretation  Date/Time:  Thursday Oct 05 2019 19:55:49 EDT Ventricular Rate:  71 PR Interval:    QRS  Duration: 103 QT Interval:  412 QTC Calculation: 448 R Axis:   -29 Text Interpretation: Sinus rhythm Borderline left axis deviation Low voltage, precordial leads 12 Lead; [place] No significant change since last tracing Confirmed by Blanchie Dessert [phone]) on 10/05/2019 10:01:00 PM   Radiology DG Chest 2 View  Result Date: 10/05/2019 CLINICAL DATA:  59 year old female with shortness of breath. EXAM: CHEST - 2 VIEW COMPARISON:  Chest radiograph dated 06/05/2016. FINDINGS: The heart size and mediastinal contours are within normal limits. Both lungs are clear. The visualized skeletal structures are unremarkable. IMPRESSION: No active cardiopulmonary disease. Electronically Signed   By: Anner Crete M.D.   On: 10/05/2019 20:09    Procedures Procedures (including critical care time)  Medications Ordered in ED Medications - No data to display  ED Course  I have reviewed the triage vital signs and the nursing notes.  Pertinent labs & imaging results that were available during my care of the patient were reviewed by me and considered in my medical decision making (see chart for details).    MDM Rules/Calculators/A&P  Patient presenting today with sneezing, cough, shortness of breath which is improved with her albuterol.  Patient is not wheezing at this time and is in no acute distress.  Oxygen saturation 96% on room air.  Patient is concerned for having Covid as she was exposed to her son who is roommates have tested positive.  She denies any fever and she is not currently having chest pain.  Patient's chest x-ray is clear, CBC, CMP without acute findings.  Covid is negative.  Patient does have asthma and is supposed to be on allergy medication but has not been taking it recently.  She reports her symptoms are resolved with albuterol.  She is well-appearing at this time.  EKG without acute findings to suggest ACS at this time.  Low suspicion for dissection or PE.  Lower  suspicion for Covid at this time as well and suspect most likely asthma exacerbation from recent allergies and not taking allergy medication.  Patient was given a prescription for prednisone to start only if her inhaler and allergy medication does not work because of her diabetes and difficulty to control with insulin.  Encourage patient to follow-up with PCP next week if symptoms are not improving.  Also recommended returning here if symptoms worsen.  MDM Number of Diagnoses or Management Options   Amount and/or Complexity of Data Reviewed Clinical lab tests: ordered and reviewed Tests in the radiology section of CPT: ordered and reviewed Tests in the medicine section of CPT: ordered and reviewed Decide to obtain previous medical records or to obtain history from someone other than the patient: yes Obtain history from someone other than the patient: no Discuss the patient with other providers: no Independent visualization of images, tracings, or specimens: yes  Risk of Complications, Morbidity, and/or Mortality Presenting problems: moderate Diagnostic procedures: low Management options: low  Patient Progress Patient progress: stable  Final Clinical Impression(s) / ED Diagnoses Final diagnoses:  Moderate persistent asthma, unspecified whether complicated    Rx / DC Orders ED Discharge Orders         Ordered    predniSONE (DELTASONE) 20 MG tablet  Daily     10/05/19 2202           Blanchie Dessert, MD 10/05/19 2226

## 2019-10-05 NOTE — ED Triage Notes (Signed)
Covid exposure Sunday. Pt sts mild shob and nonproductive cough and generalized body aches. Denies fever or difficulty breathing.

## 2019-10-05 NOTE — Discharge Instructions (Signed)
The chest x-ray today is normal as well as all the lab work.  Your EKG was normal today.  The Covid test was negative.  This may just be your asthma acting up with all the pollen.  Start taking allergy medicine again with your nasal spray and either Claritin or Zyrtec.  It is okay to take Benadryl at night.  Only start the steroids if the inhaler and those medications are not working because it will increase your blood sugar.  It will be important for you to follow-up with your doctor next week to make sure your symptoms are improving.

## 2019-10-06 ENCOUNTER — Telehealth: Payer: Self-pay

## 2019-10-06 NOTE — Telephone Encounter (Signed)
[facility] Night - Client TELEPHONE ADVICE RECORD [facility] Patient Name: Alexa Woods Gender: Female DOB: [birth year] Age: 59 Y 16 M 19 D Return Phone Number: [phone] (Primary) Address: City/State/Zip: [place] [address] Client [facility] Night - Client Client Site [facility] Tower, Roque Lias - MD Contact Type Call Who Is Calling Patient / Member / Family / Caregiver Call Type Triage / Clinical Relationship To Patient Self Return Phone Number [phone] (Primary) Chief Complaint BREATHING - shortness of breath or sounds breathless Reason for Call Request to Schedule Office Appointment Initial Comment Caller states she wants to schedule appointment with Dr. Glori Bickers. Caller has been exposed to Covid, having difficulty breathing, sore throat, and back of throat is red. Translation No Nurse Assessment Nurse: Laurena Bering, RN, Helene Kelp Date/Time [name] Time): 10/05/2019 5:31:35 PM Confirm and document reason for call. If symptomatic, describe symptoms. ---Caller states that she was exposed to [place]. Has asthma, SOB while at rest, mild chest pressure Red sore throat. No fever Has the patient had close contact with a person known or suspected to have the novel coronavirus illness OR traveled / lives in area with major community spread (including international travel) in the last 14 days from the onset of symptoms? * If Asymptomatic, screen for exposure and travel within the last 14 days. ---Yes Does the patient have any new or worsening symptoms? ---Yes Will a triage be completed? ---Yes Related visit to physician within the last 2 weeks? ---No Does the PT have any chronic conditions? (i.e. diabetes, asthma, this includes High risk factors for pregnancy, etc.) ---Yes List chronic conditions. ---asthma, DM Is this a behavioral health or substance abuse call? ---No Guidelines Guideline Title  Affirmed Question Affirmed Notes Nurse Date/Time (Eastern Time) COVID-19 - Diagnosed or Suspected MODERATE difficulty breathing (e.g., speaks in phrases, SOB even at rest, pulse 100-120) Laurena Bering, RN, Helene Kelp 10/05/2019 5:33:17 PM PLEASE NOTE: All timestamps contained within this report are represented as [place] Standard Time. CONFIDENTIALTY NOTICE: This fax transmission is intended only for the addressee. It contains information that is legally privileged, confidential or otherwise protected from use or disclosure. If you are not the intended recipient, you are strictly prohibited from reviewing, disclosing, copying using or disseminating any of this information or taking any action in reliance on or regarding this information. If you have received this fax in error, please notify us immediately by telephone so that we can arrange for its return to [place]. Phone: [phone], Toll-Free: [phone], Fax: [phone] Page: 2 of 2 Call Id: [id number] [place]. Time [name] Time) Disposition Final User 10/05/2019 5:29:41 PM Send to Urgent Ezra Sites 10/05/2019 5:34:11 PM Go to ED Now Yes Laurena Bering, RN, [place] Artist Disagree/Comply Comply Caller Understands Yes PreDisposition Call Doctor Care Advice Given Per Guideline GO TO ED NOW: YOU SHOULD TELL HEALTHCARE PERSONNEL THAT YOU MIGHT HAVE COVID-19: * Tell the first healthcare worker you meet that you may have COVID-19. * Tell them you have symptoms and have been sent for COVID-19 testing. WEAR A MASK - COVER YOUR MOUTH AND NOSE: * Wear a mask. ANOTHER ADULT SHOULD DRIVE: * It is better and safer if another adult drives instead of you. CARE ADVICE given per COVID-19 - DIAGNOSED OR SUSPECTED (Adult) guideline. Referrals [name] - ED

## 2019-10-06 NOTE — Telephone Encounter (Signed)
Per chart review tab pt was seen at [facility] ED on 10/05/19.

## 2019-10-29 ENCOUNTER — Other Ambulatory Visit: Payer: Self-pay | Admitting: Family Medicine

## 2019-10-31 NOTE — Telephone Encounter (Signed)
Last filled on 09/05/19 #0.4 mL with 1 refill, this Rx is for #60 mL, pt also had hospital f/u on 09/05/19, please advise

## 2019-11-27 ENCOUNTER — Ambulatory Visit (INDEPENDENT_AMBULATORY_CARE_PROVIDER_SITE_OTHER): Payer: No Typology Code available for payment source | Admitting: Internal Medicine

## 2019-11-27 ENCOUNTER — Other Ambulatory Visit: Payer: Self-pay

## 2019-11-27 ENCOUNTER — Encounter: Payer: Self-pay | Admitting: Internal Medicine

## 2019-11-27 VITALS — BP 128/80 | HR 75 | Ht 62.25 in | Wt 253.0 lb

## 2019-11-27 DIAGNOSIS — E1142 Type 2 diabetes mellitus with diabetic polyneuropathy: Secondary | ICD-10-CM

## 2019-11-27 DIAGNOSIS — E785 Hyperlipidemia, unspecified: Secondary | ICD-10-CM | POA: Diagnosis not present

## 2019-11-27 DIAGNOSIS — E559 Vitamin D deficiency, unspecified: Secondary | ICD-10-CM | POA: Diagnosis not present

## 2019-11-27 LAB — POCT GLYCOSYLATED HEMOGLOBIN (HGB A1C): Hemoglobin A1C: 6.6 % — AB (ref 4.0–5.6)

## 2019-11-27 NOTE — Patient Instructions (Signed)
Please continue: - Lantus 30 units at bedtime - Humulin R insulin:  10-14 units before b'fast  10-14 units before lunch 10-14 units before dinner If you plan to be more active after a meal, please reduce the insulin before that meal by 5 units.  Please come back for a follow-up appointment in 4 months.
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
## 2019-11-28 LAB — VITAMIN D 25 HYDROXY (VIT D DEFICIENCY, FRACTURES): Vit D, 25-Hydroxy: 39 ng/mL (ref 30.0–100.0)

## 2019-12-20 DIAGNOSIS — M6530 Trigger finger, unspecified finger: Secondary | ICD-10-CM | POA: Insufficient documentation

## 2020-01-15 ENCOUNTER — Other Ambulatory Visit: Payer: Self-pay | Admitting: Internal Medicine

## 2020-01-17 ENCOUNTER — Emergency Department (HOSPITAL_COMMUNITY): Payer: Medicare Other

## 2020-01-17 ENCOUNTER — Emergency Department (HOSPITAL_COMMUNITY)
Admission: EM | Admit: 2020-01-17 | Discharge: 2020-01-17 | Disposition: A | Discharge status: Home / Self Care | Payer: Medicare Other | Attending: Emergency Medicine | Admitting: Emergency Medicine

## 2020-01-17 ENCOUNTER — Encounter (HOSPITAL_COMMUNITY): Payer: Self-pay

## 2020-01-17 ENCOUNTER — Other Ambulatory Visit: Payer: Self-pay

## 2020-01-17 DIAGNOSIS — E039 Hypothyroidism, unspecified: Secondary | ICD-10-CM | POA: Diagnosis not present

## 2020-01-17 DIAGNOSIS — S0993XA Unspecified injury of face, initial encounter: Secondary | ICD-10-CM | POA: Diagnosis not present

## 2020-01-17 DIAGNOSIS — Z794 Long term (current) use of insulin: Secondary | ICD-10-CM | POA: Insufficient documentation

## 2020-01-17 DIAGNOSIS — S0990XA Unspecified injury of head, initial encounter: Secondary | ICD-10-CM

## 2020-01-17 DIAGNOSIS — J45909 Unspecified asthma, uncomplicated: Secondary | ICD-10-CM | POA: Diagnosis not present

## 2020-01-17 DIAGNOSIS — F17210 Nicotine dependence, cigarettes, uncomplicated: Secondary | ICD-10-CM | POA: Diagnosis not present

## 2020-01-17 DIAGNOSIS — I10 Essential (primary) hypertension: Secondary | ICD-10-CM | POA: Diagnosis not present

## 2020-01-17 DIAGNOSIS — Y92480 Sidewalk as the place of occurrence of the external cause: Secondary | ICD-10-CM | POA: Diagnosis not present

## 2020-01-17 DIAGNOSIS — W101XXA Fall (on)(from) sidewalk curb, initial encounter: Secondary | ICD-10-CM | POA: Diagnosis not present

## 2020-01-17 DIAGNOSIS — Y998 Other external cause status: Secondary | ICD-10-CM | POA: Insufficient documentation

## 2020-01-17 DIAGNOSIS — M542 Cervicalgia: Secondary | ICD-10-CM | POA: Insufficient documentation

## 2020-01-17 DIAGNOSIS — Z79890 Hormone replacement therapy (postmenopausal): Secondary | ICD-10-CM | POA: Diagnosis not present

## 2020-01-17 DIAGNOSIS — W19XXXA Unspecified fall, initial encounter: Secondary | ICD-10-CM

## 2020-01-17 DIAGNOSIS — Y9389 Activity, other specified: Secondary | ICD-10-CM | POA: Insufficient documentation

## 2020-01-17 DIAGNOSIS — Z96653 Presence of artificial knee joint, bilateral: Secondary | ICD-10-CM | POA: Insufficient documentation

## 2020-01-17 DIAGNOSIS — E1140 Type 2 diabetes mellitus with diabetic neuropathy, unspecified: Secondary | ICD-10-CM | POA: Insufficient documentation
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
## 2020-01-17 NOTE — Discharge Instructions (Signed)
Please continue taking your home pain medications for your symptoms.  You should follow-up with your orthopedic doctor in regards to the remainder of your injuries and return to the emergency department for any new or worsening symptoms.

## 2020-01-17 NOTE — ED Provider Notes (Signed)
[facility] DEPT Provider Note   CSN: [id number] Arrival date & time: 01/17/20  1849     History Chief Complaint  Patient presents with  . Nose Pain    Alexa Woods is a 59 y.o. female.  HPI   59 year old female with a history of alcohol abuse in remission, asthma, lower extremity edema, depression, dyspnea, GERD, Bell's palsy, chronic today cystitis, IBS, diabetes, OSA, psoriasis, hypertension, who presents to the emergency department today for evaluation after a fall.  Patient states she was walking outside yesterday when she twisted her ankle and fell forward hitting her left upper extremity, left knee and face on the ground.  She is not sure if she lost consciousness but she is complaining of a headache and pain to her nose and neck.  She denies any new numbness or weakness.  She is complaining of pain to the left knee and the left upper extremity however she was evaluated by her orthopedist office today and had x-rays completed.  She is currently in a splint of the left upper extremity and states that she broke her arm.  They told her that she would need to be evaluated in the emergency department for her nose pain and other complaints.  Past Medical History:  Diagnosis Date  . Alcohol abuse, in remission    since 1998  . Allergic rhinitis   . Anxiety   . Asthma   . Bilateral lower extremity edema   . Bulging lumbar disc   . Bulging of cervical intervertebral disc   . Chronic constipation   . DDD (degenerative disc disease), lumbosacral   . Depression   . Dyspnea   . GERD (gastroesophageal reflux disease)   . Hemorrhoids   . History of Bell's palsy 08/2007   left  . History of chronic cystitis   . IBS (irritable bowel syndrome)   . Insulin dependent type 2 diabetes mellitus [facility])    endocrinologist-- dr Cruzita Lederer  . Lower urinary tract symptoms (LUTS)   . OA (osteoarthritis)    knees, back, hands, elbows  . OSA (obstructive sleep apnea)     per study 06-08-2017 mild osa , cpap recommended , per pt insurance issue  . Peripheral neuropathy   . PONV (postoperative nausea and vomiting)   . Psoriasis   . S/P dilatation of esophageal stricture   . Unspecified essential hypertension   . Wears partial dentures    lower    Patient Active Problem List   Diagnosis Date Noted  . Hydrocephalus ([place]) 09/05/2019  . Dry eyes 09/05/2019  . Intractable headache 08/25/2019  . Intractable nausea and vomiting 08/25/2019  . Hypothyroidism 04/04/2019  . Screening mammogram, encounter for 04/03/2019  . Routine general medical examination at a health care facility 04/03/2019  . Encounter for screening for HIV 04/03/2019  . Encounter for hepatitis C screening test for low risk patient 04/03/2019  . Visit for routine gyn exam 04/03/2019  . Vitamin D deficiency 04/03/2019  . Proximal humerus fracture 02/21/2019  . Intertrigo 02/21/2019  . History of left knee replacement 12/26/2017  . Primary osteoarthritis involving multiple joints 12/13/2017  . Primary osteoarthritis of left knee 12/06/2017  . Chronic neck pain 08/13/2017  . Pre-operative exam 04/05/2017  . Class 3 obesity with serious comorbidity and body mass index (BMI) of 40.0 to 44.9 in adult 01/21/2017  . Dysuria 08/02/2015  . Complicated UTI (urinary tract infection) 05/09/2014  . Joint swelling 12/20/2013  . Periodontal disease 02/03/2013  .  Hyperlipidemia associated with type 2 diabetes mellitus ([place]) 09/27/2012  . Hyperlipidemia 09/27/2012  . DYSPHAGIA UNSPECIFIED 08/07/2010  . Low back pain 07/22/2010  . 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  . Type 2 diabetes mellitus with peripheral neuropathy ([place]) 11/30/2006  . History of alcohol abuse 11/30/2006  . Depression with anxiety 11/30/2006  . Essential hypertension 11/30/2006  . HEMORRHOIDS 11/30/2006  . Allergic rhinitis 11/30/2006  . Asthma 11/30/2006  . GERD without esophagitis 11/30/2006  . Psoriasis 11/30/2006     Past Surgical History:  Procedure Laterality Date  . CHOLECYSTECTOMY OPEN  1990   AND APPENDECTOMY  . DOBUTAMINE STRESS ECHO  2009    normal stress echo, no evidence of ischemia  . ELBOW SURGERY Bilateral RIGHT 2013;  LEFT 2016   for nerve damage  . ESOPHAGOGASTRODUODENOSCOPY  07/2002   erythematous gastropathy  . KNEE ARTHROSCOPY Left 11/ 2018   dr Wynelle Link  @ [facility]  . KNEE ARTHROSCOPY W/ PARTIAL MEDIAL MENISCECTOMY Right 10/2008   and chondroplasty  . SHOULDER ARTHROSCOPY WITH DISTAL CLAVICLE RESECTION Left 12/ 2011   dr dean  . TOTAL KNEE ARTHROPLASTY Right 07-01-2009  dr Wynelle Link   [facility]  . TOTAL KNEE ARTHROPLASTY Left 12/06/2017   Procedure: LEFT TOTAL LEFT KNEE ARTHROPLASTY;  Surgeon: Gaynelle Arabian, MD;  Location: [facility];  Service: Orthopedics;  Laterality: Left;  Marland Kitchen VAGINAL HYSTERECTOMY  2003     OB History   No obstetric history on file.     Family History  Problem Relation Age of Onset  . Alcohol abuse Mother   . Hypertension Mother   . Esophageal cancer Maternal Grandmother   . Lung cancer Other        mat great uncle  . Colon cancer Neg Hx     Social History   Tobacco Use  . Smoking status: Former Smoker    Years: 25.00    Types: Cigarettes    Quit date: 12/30/2001    Years since quitting: 18.0  . Smokeless tobacco: Never Used  Vaping Use  . Vaping Use: Never used  Substance Use Topics  . Alcohol use: No    Alcohol/week: 0.0 standard drinks    Comment: hx alcoholism--  stopped 1998   . Drug use: No    Home Medications Prior to Admission medications   Medication Sig Start Date End Date Taking? Authorizing Provider  acetaminophen (TYLENOL) 325 MG tablet Take 2 tablets (650 mg total) by mouth every 4 (four) hours as needed for mild pain or headache (or Fever >/= 101). 08/26/19   Regalado, Belkys A, MD  albuterol (VENTOLIN HFA) 108 (90 Base) MCG/ACT inhaler Inhale 2 puffs into the lungs every 4 (four) hours as needed for wheezing or shortness of breath.  02/21/19   Tower, Wynelle Fanny, MD  BD INSULIN SYRINGE U/F 31G X 5/16" 0.5 ML MISC USE THREE TIMES PER DAY WITH R INSULIN & ONCE DAILY WITH LEVEMIR 06/19/19   [name], MD  beclomethasone (QVAR) 80 MCG/ACT inhaler Inhale 2 puffs 2 (two) times daily into the lungs. Rinse mouth after use. 04/08/17   Laverle Hobby, MD  docusate sodium (COLACE) 100 MG capsule Take 100 mg by mouth every morning.     [provider]  fluticasone (FLONASE) 50 MCG/ACT nasal spray Place 2 sprays into both nostrils daily as needed for allergies or rhinitis. 02/21/19   Tower, Wynelle Fanny, MD  hydrochlorothiazide (HYDRODIURIL) 25 MG tablet TAKE 1 TABLET BY MOUTH EVERY DAY Patient taking differently: Take 25 mg by mouth  daily.  05/20/19   Tower, Wynelle Fanny, MD  insulin glargine (LANTUS) 100 UNIT/ML injection Inject 0.35 mLs (35 Units total) into the skin daily. Patient taking differently: Inject 30 Units into the skin daily.  07/28/19   [name], MD  insulin regular (HUMULIN R) 100 units/mL injection INJECT 0.14-0.24ML'S (14 TO 24 UNITS TOTAL) INTO THE SKIN THREE TIMES A DAY BEFORE MEALS. 01/15/20   [name], MD  KLOR-CON M10 10 MEQ tablet TAKE 1 TABLET BY MOUTH EVERY DAY Patient taking differently: Take 10 mEq by mouth daily.  05/16/19   Tower, Wynelle Fanny, MD  Lancets [facility] ULTRASOFT) lancets Use to test blood sugar 4 times daily as instructed. 10/04/18   [name], MD  levothyroxine (SYNTHROID) 75 MCG tablet Take 1 tablet (75 mcg total) by mouth daily before breakfast. 10/02/19   Tower, Wynelle Fanny, MD  LORazepam (ATIVAN) 1 MG tablet Take 2 mg by mouth 2 (two) times daily as needed for anxiety or sleep.     [provider]  [facility] ULTRA test strip USE TO TEST BLOOD SUGAR 4 TIMES DAILY AS INSTRUCTED. 09/12/19   [name], MD  oxyCODONE-acetaminophen (PERCOCET) 10-325 MG tablet Take 1 tablet by mouth every 4 (four) hours as needed for pain. Hold for SBP < = 105 12/15/17   Medina-Vargas,  Monina C, NP  pantoprazole (PROTONIX) 40 MG tablet Take 1 tablet (40 mg total) by mouth 2 (two) times daily. 08/26/19   Regalado, Belkys A, MD  polyethylene glycol (MIRALAX / GLYCOLAX) packet Take 17 g by mouth daily as needed for mild constipation or moderate constipation.     [provider]  predniSONE (DELTASONE) 20 MG tablet Take 2 tablets (40 mg total) by mouth daily. Only start if your breathing is not improving with your inhaler 10/05/19   Blanchie Dessert, MD  RESTASIS 0.05 % ophthalmic emulsion INSTILL 1 DROP INTO BOTH EYES TWICE A DAY 10/31/19   Tower, Wynelle Fanny, MD  senna (SENOKOT) 8.6 MG TABS tablet Take 1 tablet by mouth daily.     [provider]  sertraline (ZOLOFT) 100 MG tablet Take 200 mg by mouth every morning.     [provider]  tiZANidine (ZANAFLEX) 4 MG tablet Take 4 mg by mouth 2 (two) times daily as needed for muscle spasms.  07/10/14   [provider]  triamcinolone cream (KENALOG) 0.1 % Apply 1 application topically 2 (two) times daily. To psoriasis areas 08/22/18   Tower, Wynelle Fanny, MD  UNABLE TO FIND Diet type : NCS    [provider]  zolpidem (AMBIEN) 10 MG tablet Take 10 mg by mouth at bedtime as needed for sleep.     [provider]    Allergies    Bupropion, Bupropion hcl, Cefuroxime axetil, Gabapentin, Lidocaine, Metformin, Other, Paroxetine, Propoxyphene, Tramadol, Tramadol hcl, Codeine, and Sulfa antibiotics  Review of Systems   Review of Systems  Constitutional: Negative for fever.  HENT: Negative for ear pain and sore throat.        Nose pain  Eyes: Negative for visual disturbance.  Respiratory: Negative for cough and shortness of breath.   Cardiovascular: Negative for chest pain.  Gastrointestinal: Negative for abdominal pain, constipation, diarrhea, nausea and vomiting.  Genitourinary: Negative for dysuria and hematuria.  Musculoskeletal: Negative for back pain.       LUE pain, left knee pain  Skin:  Negative for rash.  Neurological: Negative for headaches.       Head injury, headache  All other systems reviewed and are negative.   Physical Exam Updated Vital Signs BP 131/70 (BP Location: Right Arm)   Pulse 67   Temp 97.8 F (36.6 C) (Oral)   Resp 18   SpO2 98%   Physical Exam Vitals and nursing note reviewed.  Constitutional:      General: She is not in acute distress.    Appearance: She is well-developed.  HENT:     Head: Normocephalic.     Comments: Abrasions noted to the nose and chin.  TTP over the chin and nasal bridge.  No epistaxis or obvious nasal septal hematoma. Eyes:     Conjunctiva/sclera: Conjunctivae normal.  Cardiovascular:     Rate and Rhythm: Normal rate.  Pulmonary:     Effort: Pulmonary effort is normal.  Abdominal:     Palpations: Abdomen is soft.  Musculoskeletal:     Cervical back: Neck supple.     Comments: Mild TTP to the lower cervical spine into the left cervical paraspinous muscles and left trapezius which reproduces pain.  Strength and sensation intact to the right upper extremity, unable to test strength in left upper extremity secondary to patient being in splint.  Bilateral lower extremity strength is intact and symmetric bilaterally.  Sensation intact bilaterally.  No TTP to the thoracic or lumbar spine.  Skin:    General: Skin is warm and dry.  Neurological:     Mental Status: She is alert.     ED Results / Procedures / Treatments   Labs (all labs ordered are listed, but only abnormal results are displayed) Labs Reviewed - No data to display  EKG None  Radiology CT Head Wo Contrast  Result Date: 01/17/2020 CLINICAL DATA:  [place] face first, facial trauma, pain EXAM: CT HEAD WITHOUT CONTRAST TECHNIQUE: Contiguous axial images were obtained from the base of the skull through the vertex without intravenous contrast. COMPARISON:  08/25/2019 FINDINGS: Brain: No acute infarct or hemorrhage. Stable hypodensities left frontal  periventricular white matter consistent with chronic small vessel ischemic change. Lateral ventricles and remaining midline structures are unremarkable. No acute extra-axial fluid collections. No mass effect. Vascular: No hyperdense vessel or unexpected calcification. Skull: Normal. Negative for fracture or focal lesion. Sinuses/Orbits: No acute finding. Other: None. IMPRESSION: 1. Stable exam, no acute process. Electronically Signed   By: Randa Ngo M.D.   On: 01/17/2020 20:42   CT Cervical Spine Wo Contrast  Result Date: 01/17/2020 CLINICAL DATA:  Neck trauma, midline tenderness (Age 57-64y) Fall left-sided neck pain. EXAM: CT CERVICAL SPINE WITHOUT CONTRAST TECHNIQUE: Multidetector CT imaging of the cervical spine was performed without intravenous contrast. Multiplanar CT image reconstructions were also generated. COMPARISON:  None. FINDINGS: Alignment: Straightening of normal lordosis. No traumatic subluxation. Skull base and vertebrae: No acute fracture. Vertebral body heights are maintained. The dens and skull base are intact. Soft tissues and spinal canal: No prevertebral fluid or swelling. No visible canal hematoma. Disc levels: Disc space narrowing and anterior spurring at C5-C6. There is mild scattered facet hypertrophy. Upper chest: No acute findings. Other: None. IMPRESSION: Mild degenerative change in the cervical spine without acute fracture or subluxation. Electronically Signed   By: Keith Rake M.D.   On: 01/17/2020 21:57   CT Maxillofacial Wo Contrast  Result Date: 01/17/2020 CLINICAL DATA:  Facial trauma, fell, nasal pain EXAM: CT MAXILLOFACIAL WITHOUT CONTRAST TECHNIQUE: Multidetector CT imaging of the maxillofacial structures was performed. Multiplanar CT image reconstructions were also generated. COMPARISON:  None. FINDINGS: Osseous: No acute  displaced fractures. Orbits: Negative. No traumatic or inflammatory finding. Sinuses: Clear. Soft tissues: Negative. Limited intracranial:  No significant or unexpected finding. Reconstructed images demonstrate no additional findings. IMPRESSION: 1. No acute facial bone fractures. Electronically Signed   By: Randa Ngo M.D.   On: 01/17/2020 20:40    Procedures Procedures (including critical care time)  Medications Ordered in ED Medications - No data to display  ED Course  I have reviewed the triage vital signs and the nursing notes.  Pertinent labs & imaging results that were available during my care of the patient were reviewed by me and considered in my medical decision making (see chart for details).    MDM Rules/Calculators/A&P                          59 year old female presenting for evaluation of nose pain, head injury and neck pain after a mechanical fall last night.  She also had some left upper extremity pain and left knee pain.  Was seen by her orthopedist at [facility] prior to arrival and diagnosed with left upper extremity fracture.  X-ray of the left knee was noted to be negative.  She is currently in a splint.  She underwent CT head which did not show any acute intracranial abnormality or skull fracture.  CT maxillofacial did not show any evidence of facial fracture.  CT cervical spine did not show any evidence of cervical spine fracture or subluxation.  Patient does have home pain medication.  Advised her to continue this to help with pain and have close follow-up with PCP and strict return precautions.  She voices understanding of the plan and reasons to return.  All questions answered.  Patient stable for discharge.   Final Clinical Impression(s) / ED Diagnoses Final diagnoses:  Fall, initial encounter  Injury of head, initial encounter  Facial injury, initial encounter  Neck pain    Rx / DC Orders ED Discharge Orders    None       Rodney Booze, PA-C 01/17/20 2203    Drenda Freeze, MD 01/17/20 [phone]

## 2020-03-25 ENCOUNTER — Other Ambulatory Visit: Payer: Self-pay | Admitting: Family Medicine

## 2020-04-02 ENCOUNTER — Ambulatory Visit: Payer: PRIVATE HEALTH INSURANCE | Admitting: Internal Medicine

## 2020-04-10 DIAGNOSIS — S63501A Unspecified sprain of right wrist, initial encounter: Secondary | ICD-10-CM | POA: Insufficient documentation

## 2020-04-23 ENCOUNTER — Other Ambulatory Visit: Payer: Self-pay | Admitting: Family Medicine

## 2020-06-05 ENCOUNTER — Other Ambulatory Visit: Payer: Self-pay | Admitting: *Deleted

## 2020-06-05 MED ORDER — POTASSIUM CHLORIDE CRYS ER 10 MEQ PO TBCR: 10.0000 meq | EXTENDED_RELEASE_TABLET | Freq: Every day | ORAL | 1 refills | Status: DC

## 2020-06-05 MED ORDER — HYDROCHLOROTHIAZIDE 25 MG PO TABS: 25.0000 mg | ORAL_TABLET | Freq: Every day | ORAL | 1 refills | Status: DC

## 2020-06-05 MED ORDER — PANTOPRAZOLE SODIUM 40 MG PO TBEC: 40.0000 mg | DELAYED_RELEASE_TABLET | Freq: Two times a day (BID) | ORAL | 1 refills | Status: DC

## 2020-06-06 ENCOUNTER — Other Ambulatory Visit: Payer: Self-pay

## 2020-06-06 ENCOUNTER — Ambulatory Visit (INDEPENDENT_AMBULATORY_CARE_PROVIDER_SITE_OTHER): Payer: PRIVATE HEALTH INSURANCE | Admitting: Family Medicine

## 2020-06-06 ENCOUNTER — Encounter: Payer: Self-pay | Admitting: Family Medicine

## 2020-06-06 DIAGNOSIS — S2241XA Multiple fractures of ribs, right side, initial encounter for closed fracture: Secondary | ICD-10-CM | POA: Diagnosis not present

## 2020-06-06 DIAGNOSIS — S2249XA Multiple fractures of ribs, unspecified side, initial encounter for closed fracture: Secondary | ICD-10-CM | POA: Insufficient documentation

## 2020-06-06 DIAGNOSIS — S2241XS Multiple fractures of ribs, right side, sequela: Secondary | ICD-10-CM | POA: Diagnosis not present

## 2020-06-06 NOTE — Assessment & Plan Note (Addendum)
Right side , lower ribs and pain is anterior and lateral after a fall on 12/24 Pain has improved slt  No s/s of pneumonia  Disc risk of atelectasis - and inst to take a deeper breath holding a pillow for support once an hour while awake  Has chronic percocet for pain  Adv warm /cool compresses  Sent for [facility] records/xray report  Will re image depending on those and pt's progress (if needed)

## 2020-06-06 NOTE — Patient Instructions (Signed)
Use ice or heat on rib area that hurts  Pain medicine as needed   Every hour when awake,  grab a pillow and take a deeper breath  This is to prevent pneumonia   If you develop sudden shortness of breath let us know and go to the ER  If you develop cough/especially if productive or fever- let us know  Try to prop yourself up at night   Stop at check out to get records from urgent care

## 2020-06-06 NOTE — Progress Notes (Signed)
Subjective:    Patient ID: Alexa Woods, female    DOB: 07-Nov-1960, 60 y.o.   MRN: [id number]  This visit occurred during the SARS-CoV-2 public health emergency.  Safety protocols were in place, including screening questions prior to the visit, additional usage of staff PPE, and extensive cleaning of exam room while observing appropriate contact time as indicated for disinfecting solutions.    HPI Pt presents for f/u of rib fx  Wt Readings from Last 3 Encounters:  06/06/20 256 lb 4 oz (116.2 kg)  11/27/19 253 lb (114.8 kg)  10/05/19 250 lb (113.4 kg)   46.49 kg/m  Pt fell on 12/24- went out back to feed her dog  Stepped on a branch and foot went out  She landed on R side and arm (which was held to her body) She felt a crunch fx 2 ribs R side -she does not know if displaced   Went to [place] in [place]   ([facility] -the following Monday   Not coughing  occ a little phlegm Not sob  Hurts to take a deep breath   Pain has improved a little   She already takes percocet for chronic pain   Patient Active Problem List   Diagnosis Date Noted  . Rib fractures 06/06/2020  . Hydrocephalus ([place]) 09/05/2019  . Dry eyes 09/05/2019  . Intractable headache 08/25/2019  . Intractable nausea and vomiting 08/25/2019  . Hypothyroidism 04/04/2019  . Screening mammogram, encounter for 04/03/2019  . Routine general medical examination at a health care facility 04/03/2019  . Encounter for screening for HIV 04/03/2019  . Encounter for hepatitis C screening test for low risk patient 04/03/2019  . Visit for routine gyn exam 04/03/2019  . Vitamin D deficiency 04/03/2019  . Proximal humerus fracture 02/21/2019  . Intertrigo 02/21/2019  . History of left knee replacement 12/26/2017  . Primary osteoarthritis involving multiple joints 12/13/2017  . Primary osteoarthritis of left knee 12/06/2017  . Chronic neck pain 08/13/2017  . Pre-operative exam 04/05/2017  . Class 3 obesity with  serious comorbidity and body mass index (BMI) of 40.0 to 44.9 in adult 01/21/2017  . Dysuria 08/02/2015  . Complicated UTI (urinary tract infection) 05/09/2014  . Joint swelling 12/20/2013  . Periodontal disease 02/03/2013  . Hyperlipidemia associated with type 2 diabetes mellitus ([place]) 09/27/2012  . Hyperlipidemia 09/27/2012  . DYSPHAGIA UNSPECIFIED 08/07/2010  . Low back pain 07/22/2010  . 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  . Type 2 diabetes mellitus with peripheral neuropathy ([place]) 11/30/2006  . History of alcohol abuse 11/30/2006  . Depression with anxiety 11/30/2006  . Essential hypertension 11/30/2006  . HEMORRHOIDS 11/30/2006  . Allergic rhinitis 11/30/2006  . Asthma 11/30/2006  . GERD without esophagitis 11/30/2006  . Psoriasis 11/30/2006   Past Medical History:  Diagnosis Date  . Alcohol abuse, in remission    since 1998  . Allergic rhinitis   . Anxiety   . Asthma   . Bilateral lower extremity edema   . Bulging lumbar disc   . Bulging of cervical intervertebral disc   . Chronic constipation   . DDD (degenerative disc disease), lumbosacral   . Depression   . Dyspnea   . GERD (gastroesophageal reflux disease)   . Hemorrhoids   . History of Bell's palsy 08/2007   left  . History of chronic cystitis   . IBS (irritable bowel syndrome)   . Insulin dependent type 2 diabetes mellitus [facility])    endocrinologist-- dr Cruzita Lederer  .  Lower urinary tract symptoms (LUTS)   . OA (osteoarthritis)    knees, back, hands, elbows  . OSA (obstructive sleep apnea)    per study 06-08-2017 mild osa , cpap recommended , per pt insurance issue  . Peripheral neuropathy   . PONV (postoperative nausea and vomiting)   . Psoriasis   . S/P dilatation of esophageal stricture   . Unspecified essential hypertension   . Wears partial dentures    lower   Past Surgical History:  Procedure Laterality Date  . CHOLECYSTECTOMY OPEN  1990   AND APPENDECTOMY  . DOBUTAMINE STRESS ECHO  2009     normal stress echo, no evidence of ischemia  . ELBOW SURGERY Bilateral RIGHT 2013;  LEFT 2016   for nerve damage  . ESOPHAGOGASTRODUODENOSCOPY  07/2002   erythematous gastropathy  . KNEE ARTHROSCOPY Left 11/ 2018   dr Wynelle Link  @ [facility]  . KNEE ARTHROSCOPY W/ PARTIAL MEDIAL MENISCECTOMY Right 10/2008   and chondroplasty  . SHOULDER ARTHROSCOPY WITH DISTAL CLAVICLE RESECTION Left 12/ 2011   dr dean  . TOTAL KNEE ARTHROPLASTY Right 07-01-2009  dr Wynelle Link   [facility]  . TOTAL KNEE ARTHROPLASTY Left 12/06/2017   Procedure: LEFT TOTAL LEFT KNEE ARTHROPLASTY;  Surgeon: Gaynelle Arabian, MD;  Location: [facility];  Service: Orthopedics;  Laterality: Left;  Marland Kitchen VAGINAL HYSTERECTOMY  2003   Social History   Tobacco Use  . Smoking status: Former Smoker    Years: 25.00    Types: Cigarettes    Quit date: 12/30/2001    Years since quitting: 18.4  . Smokeless tobacco: Never Used  Vaping Use  . Vaping Use: Never used  Substance Use Topics  . Alcohol use: No    Alcohol/week: 0.0 standard drinks    Comment: hx alcoholism--  stopped 1998   . Drug use: No   Family History  Problem Relation Age of Onset  . Alcohol abuse Mother   . Hypertension Mother   . Esophageal cancer Maternal Grandmother   . Lung cancer Other        mat great uncle  . Colon cancer Neg Hx    Allergies  Allergen Reactions  . Bupropion Other (See Comments)    Pt is unsure  . Bupropion Hcl Other (See Comments)    Pt is unsure  . Cefuroxime Axetil Nausea Only  . Gabapentin Other (See Comments)    Pt does not remember reaction  Pt does not remember reaction   . Lidocaine Other (See Comments)    REACTION: unknown REACTION: unknown  . Metformin Other (See Comments)    REACTION: GI REACTION: GI  . Other Other (See Comments)  . Paroxetine Other (See Comments)    REACTION: doesn't agree REACTION: doesn't agree  . Propoxyphene Other (See Comments)    wheezing wheezing  . Tramadol Other (See Comments)    REACTION: Causes Anxiety  .  Tramadol Hcl     REACTION: Causes Anxiety  . Codeine Nausea And Vomiting and Rash  . Sulfa Antibiotics Rash and Other (See Comments)   Current Outpatient Medications on File Prior to Visit  Medication Sig Dispense Refill  . acetaminophen (TYLENOL) 325 MG tablet Take 2 tablets (650 mg total) by mouth every 4 (four) hours as needed for mild pain or headache (or Fever >/= 101). 30 tablet 0  . albuterol (VENTOLIN HFA) 108 (90 Base) MCG/ACT inhaler INHALE 2 PUFFS INTO THE LUNGS EVERY 4 (FOUR) HOURS AS NEEDED FOR WHEEZING OR SHORTNESS OF BREATH. 18 each  3  . BD INSULIN SYRINGE U/F 31G X 5/16" 0.5 ML MISC USE THREE TIMES PER DAY WITH R INSULIN & ONCE DAILY WITH LEVEMIR 100 each 11  . beclomethasone (QVAR) 80 MCG/ACT inhaler Inhale 2 puffs 2 (two) times daily into the lungs. Rinse mouth after use. 1 Inhaler 5  . docusate sodium (COLACE) 100 MG capsule Take 100 mg by mouth every morning.     . fluticasone (FLONASE) 50 MCG/ACT nasal spray Place 2 sprays into both nostrils daily as needed for allergies or rhinitis. 16 g 5  . hydrochlorothiazide (HYDRODIURIL) 25 MG tablet Take 1 tablet (25 mg total) by mouth daily. 90 tablet 1  . insulin glargine (LANTUS) 100 UNIT/ML injection Inject 0.35 mLs (35 Units total) into the skin daily. (Patient taking differently: Inject 30 Units into the skin daily.) 40 mL 3  . insulin regular (HUMULIN R) 100 units/mL injection INJECT 0.14-0.24ML'S (14 TO 24 UNITS TOTAL) INTO THE SKIN THREE TIMES A DAY BEFORE MEALS. 20 mL 9  . Lancets (ONETOUCH ULTRASOFT) lancets Use to test blood sugar 4 times daily as instructed. 200 each 11  . levothyroxine (SYNTHROID) 75 MCG tablet TAKE 1 TABLET (75 MCG TOTAL) BY MOUTH DAILY BEFORE BREAKFAST. 90 tablet 0  . LORazepam (ATIVAN) 1 MG tablet Take 2 mg by mouth 2 (two) times daily as needed for anxiety or sleep.    Glory Rosebush ULTRA test strip USE TO TEST BLOOD SUGAR 4 TIMES DAILY AS INSTRUCTED. 400 strip 11  . oxyCODONE-acetaminophen (PERCOCET)  10-325 MG tablet Take 1 tablet by mouth every 4 (four) hours as needed for pain. Hold for SBP < = 105 12 tablet 0  . pantoprazole (PROTONIX) 40 MG tablet Take 1 tablet (40 mg total) by mouth 2 (two) times daily. 180 tablet 1  . polyethylene glycol (MIRALAX / GLYCOLAX) packet Take 17 g by mouth daily as needed for mild constipation or moderate constipation.     . potassium chloride (KLOR-CON M10) 10 MEQ tablet Take 1 tablet (10 mEq total) by mouth daily. 90 tablet 1  . senna (SENOKOT) 8.6 MG TABS tablet Take 1 tablet by mouth daily.     . sertraline (ZOLOFT) 100 MG tablet Take 200 mg by mouth every morning.    Marland Kitchen tiZANidine (ZANAFLEX) 4 MG tablet Take 4 mg by mouth 2 (two) times daily as needed for muscle spasms.     Marland Kitchen triamcinolone cream (KENALOG) 0.1 % Apply 1 application topically 2 (two) times daily. To psoriasis areas 30 g 0  . zolpidem (AMBIEN) 10 MG tablet Take 10 mg by mouth at bedtime as needed for sleep.    . naloxone (NARCAN) 4 MG/0.1ML LIQD nasal spray kit Narcan 4 mg/actuation nasal spray    . RESTASIS 0.05 % ophthalmic emulsion INSTILL 1 DROP INTO BOTH EYES TWICE A DAY (Patient not taking: Reported on 06/06/2020) 60 mL 3   No current facility-administered medications on file prior to visit.    Review of Systems  Constitutional: Negative for activity change, appetite change, fatigue, fever and unexpected weight change.  HENT: Negative for congestion, ear pain, rhinorrhea, sinus pressure and sore throat.   Eyes: Negative for pain, redness and visual disturbance.  Respiratory: Negative for cough, shortness of breath and wheezing.   Cardiovascular: Negative for palpitations and leg swelling.  Gastrointestinal: Negative for abdominal pain, blood in stool, constipation and diarrhea.  Endocrine: Negative for polydipsia and polyuria.  Genitourinary: Negative for dysuria, frequency and urgency.  Musculoskeletal: Positive for arthralgias and  back pain. Negative for myalgias.  Skin: Negative  for pallor and rash.  Allergic/Immunologic: Negative for environmental allergies.  Neurological: Negative for dizziness, syncope and headaches.  Hematological: Negative for adenopathy. Does not bruise/bleed easily.  Psychiatric/Behavioral: Negative for decreased concentration and dysphoric mood. The patient is not nervous/anxious.        Objective:   Physical Exam Constitutional:      General: She is not in acute distress.    Appearance: Normal appearance. She is obese. She is not ill-appearing or diaphoretic.  Eyes:     General: No scleral icterus.    Conjunctiva/sclera: Conjunctivae normal.     Pupils: Pupils are equal, round, and reactive to light.  Cardiovascular:     Rate and Rhythm: Normal rate and regular rhythm.     Heart sounds: Normal heart sounds.  Pulmonary:     Effort: Pulmonary effort is normal. No respiratory distress.     Breath sounds: Normal breath sounds. No stridor. No wheezing, rhonchi or rales.     Comments: Tender over R anterior and lateral chest wall below breast  No crepitus or step off on palpation  No rash/skin change Chest:     Chest wall: Tenderness present.  Musculoskeletal:        General: Tenderness present.     Cervical back: Normal range of motion. No rigidity or tenderness.  Lymphadenopathy:     Cervical: No cervical adenopathy.  Skin:    General: Skin is warm and dry.     Findings: No bruising, erythema or rash.  Neurological:     Mental Status: She is alert.  Psychiatric:        Mood and Affect: Mood normal.           Assessment & Plan:   Problem List Items Addressed This Visit      Musculoskeletal and Integument   Rib fractures    Right side , lower ribs and pain is anterior and lateral after a fall on 12/24 Pain has improved slt  No s/s of pneumonia  Disc risk of atelectasis - and inst to take a deeper breath holding a pillow for support once an hour while awake  Has chronic percocet for pain  Adv warm /cool compresses   Sent for UC records/xray report  Will re image depending on those and pt's progress (if needed)

## 2020-06-09 ENCOUNTER — Other Ambulatory Visit: Payer: Self-pay | Admitting: Family Medicine

## 2020-06-13 ENCOUNTER — Telehealth: Payer: Self-pay

## 2020-06-13 DIAGNOSIS — S2241XS Multiple fractures of ribs, right side, sequela: Secondary | ICD-10-CM

## 2020-06-13 NOTE — Telephone Encounter (Signed)
Left VM requesting pt to call the office back 

## 2020-06-13 NOTE — Telephone Encounter (Signed)
I have not seen it  yet -not in my current In box If we cannot get it may need to do xray here I prefer to avoid rib belt if we can  Please send in diflucan 150 mg 1 po once #1 no ref  F/u if no imp

## 2020-06-13 NOTE — Telephone Encounter (Signed)
1. Pt asked if Dr Glori Bickers had seen the xray report from the Urgent Care for her fractured ribs. She is still in a lot of pain. I explained she could be in pain for several weeks. Suggested a rib belt. Asked if Dr Glori Bickers had any suggestions. She said the UC told her not to wrap.   2. She thinks she has a yeast infection and would like a rx for diflucan called in to [facility]. She has tried Monistat but is still itching.   Please call pt back at [phone].

## 2020-06-14 ENCOUNTER — Ambulatory Visit
Admission: RE | Admit: 2020-06-14 | Discharge: 2020-06-14 | Disposition: A | Discharge status: Home / Self Care | Payer: PRIVATE HEALTH INSURANCE | Source: Ambulatory Visit | Attending: Family Medicine | Admitting: Family Medicine

## 2020-06-14 DIAGNOSIS — S2241XS Multiple fractures of ribs, right side, sequela: Secondary | ICD-10-CM

## 2020-06-14 MED ORDER — FLUCONAZOLE 150 MG PO TABS
150.0000 mg | ORAL_TABLET | Freq: Once | ORAL | 0 refills | Status: AC
Start: 2020-06-14 — End: 2020-06-14

## 2020-06-14 NOTE — Telephone Encounter (Signed)
Patient returned your call. Please call her back. EM 

## 2020-06-14 NOTE — Telephone Encounter (Signed)
Pt notified of Dr. Tower's comments and instructions and verbalized understanding  

## 2020-06-14 NOTE — Telephone Encounter (Signed)
I put orders in for rib and chest xrays when she can get in  If suddenly sob -go to the ER  Cannot add much for pain  If she can take nsaids (unsure if she can)  and is not already could try ibuprofen 200 mg two pills up to every 8 hours with food

## 2020-06-14 NOTE — Telephone Encounter (Signed)
Pt notified Rx sent to pharmacy. Pt advise of Dr. Marliss Coots comments. I see released was faxed and I also left VM request status of xrays. Pt said she is in so much pain that she would like to come next week and get f/u xrays if Dr. Glori Bickers wants her too. Pt said her problems is that she was already on pain meds Tizanidine, percocet 10-325 due to chronic pain so there isn't much more they can give her for this fracture. Pt said has been using ice but not helping much

## 2020-06-19 ENCOUNTER — Ambulatory Visit (INDEPENDENT_AMBULATORY_CARE_PROVIDER_SITE_OTHER)
Admission: RE | Admit: 2020-06-19 | Discharge: 2020-06-19 | Disposition: A | Discharge status: Home / Self Care | Payer: PRIVATE HEALTH INSURANCE | Source: Ambulatory Visit | Attending: Family Medicine | Admitting: Family Medicine

## 2020-06-19 DIAGNOSIS — R0789 Other chest pain: Secondary | ICD-10-CM | POA: Diagnosis not present

## 2020-06-19 DIAGNOSIS — S2241XS Multiple fractures of ribs, right side, sequela: Secondary | ICD-10-CM

## 2020-06-19 DIAGNOSIS — S2241XA Multiple fractures of ribs, right side, initial encounter for closed fracture: Secondary | ICD-10-CM | POA: Diagnosis not present

## 2020-06-25 ENCOUNTER — Other Ambulatory Visit: Payer: Self-pay | Admitting: Family Medicine

## 2020-06-25 NOTE — Telephone Encounter (Signed)
Pharmacy requests refill on: Levothyroxine 75 mcg   LAST REFILL: 03/26/2020 (Q-90, R-0) LAST OV: 06/06/2020 NEXT OV: Not Scheduled  PHARMACY: [id number] [place], [place]  TSH (08/25/2019): 1.821

## 2020-06-28 ENCOUNTER — Other Ambulatory Visit: Payer: Self-pay | Admitting: Internal Medicine

## 2020-07-03 ENCOUNTER — Telehealth: Payer: Self-pay

## 2020-07-03 DIAGNOSIS — S93514A Sprain of interphalangeal joint of right lesser toe(s), initial encounter: Secondary | ICD-10-CM | POA: Diagnosis not present

## 2020-07-03 NOTE — Telephone Encounter (Signed)
Aware Elevate and ice when able also

## 2020-07-03 NOTE — Telephone Encounter (Signed)
I was able to speak with pt; pt said her toe is very painful; pt already been cked for rib pain due to fx end of Dec and ribs still hurt. Pt said pain level is 10 for toe and where toe joins foot; toe appears very bruised and swollen. Pt said difficult to walk; pt said the pain goes up leg from the toe when bearing wt. Pt said toe hurts all the time but hurts worse with bearing wt. Pt had taped the toe to the next toe but pt thought that was cutting off circulation and took tape off. Pt said money is short and she wants to try to wait for appt with Dr Glori Bickers that is already scheduled for 07/05/20 at 12 noon. UC & ED precautions given and pt voiced understanding.sending note to Dr Glori Bickers.

## 2020-07-03 NOTE — Telephone Encounter (Signed)
Left VM letting pt know Dr. Tower's comments  

## 2020-07-03 NOTE — Telephone Encounter (Signed)
[facility] - Client TELEPHONE ADVICE RECORD [facility] Patient Name: Alexa Woods 27 Gender: Female DOB: 05/28/1961 Age: 60 Y 3 M 16 D Return Phone Number: [phone] (Primary) Address: [address] City/State/Zip: [place] [address] Client [facility] - Client Client Site [place] Physician Glori Bickers, Roque Lias - MD Contact Type Call Who Is Calling Patient / Member / Family / Caregiver Call Type Triage / Clinical Relationship To Patient Self Return Phone Number [phone] (Primary) Chief Complaint Toe Injury Reason for Call Symptomatic / Request for [place] states she thinks her toe is broken, it is swollen and bruised. She hit it on a chair. [place] Not Listed UCC Translation No Nurse Assessment Nurse: Lucky Cowboy, RN, Levada Dy Date/Time (Eastern Time): 07/03/2020 12:50:46 PM Confirm and document reason for call. If symptomatic, describe symptoms. ---Caller stated that she may have broken her rt pinkie toe. She stated that she hit it on a chair about 2-3 days ago. It is swollen and bruised. No open areas noted. Does the patient have any new or worsening symptoms? ---Yes Will a triage be completed? ---Yes Related visit to physician within the last 2 weeks? ---No Does the PT have any chronic conditions? (i.e. diabetes, asthma, this includes High risk factors for pregnancy, etc.) ---Yes List chronic conditions. ---diabetes, asthma, depression, thyroid disease, fibromyalgia, chronic pain Is this a behavioral health or substance abuse call? ---No Guidelines Guideline Title Affirmed Question Affirmed Notes Nurse Date/Time (Eastern Time) Toe Injury Looks like a broken bone (e.g., crooked or deformed) Dew, RN, Levada Dy 07/03/2020 12:53:30 PM Disp. Time [name] Time) Disposition Final User 07/03/2020 12:56:53 PM See HCP within 4 Hours (or PCP triage) Yes Dew,  RN, Levada Dy PLEASE NOTE: All timestamps contained within this report are represented as [place] Standard Time. CONFIDENTIALTY NOTICE: This fax transmission is intended only for the addressee. It contains information that is legally privileged, confidential or otherwise protected from use or disclosure. If you are not the intended recipient, you are strictly prohibited from reviewing, disclosing, copying using or disseminating any of this information or taking any action in reliance on or regarding this information. If you have received this fax in error, please notify us immediately by telephone so that we can arrange for its return to [place]. Phone: [phone], Toll-Free: [phone], Fax: [phone] Page: 2 of 2 Call Id: [id number] [place] Disagree/Comply Comply Caller Understands Yes PreDisposition Call Doctor Care Advice Given Per Guideline SEE HCP (OR PCP TRIAGE) WITHIN 4 HOURS: * IF OFFICE WILL BE OPEN: You need to be seen within the next 3 or 4 hours. Call your doctor (or NP/PA) now or as soon as the office opens. * Tape the injured toe to the toe next to it (this is called a buddy splint). USE A COLD PACK FOR PAIN, SWELLING, OR BRUISING: * Put a cold pack or an ice bag (wrapped in a moist towel) on the area for 20 minutes. * Repeat in 1 hour, then every 4 hours while awake. * Continue this for the first 48 hours (2 days) after an injury. * This will help decrease pain, swelling, and bruising. * Caution: avoid frostbite. CALL BACK IF: * You become worse CARE ADVICE given per Toe Injury (Adult) guideline. Comments User: Raford Pitcher, RN Date/Time [name] Time): 07/03/2020 12:58:03 PM No app't available at the office per Raquel Sarna on the backline #. Referrals Warm transfer to backline GO TO FACILITY OTHER - SPECIFY

## 2020-07-04 ENCOUNTER — Telehealth (INDEPENDENT_AMBULATORY_CARE_PROVIDER_SITE_OTHER): Payer: PRIVATE HEALTH INSURANCE | Admitting: Family Medicine

## 2020-07-04 ENCOUNTER — Encounter: Payer: Self-pay | Admitting: Family Medicine

## 2020-07-04 DIAGNOSIS — H9202 Otalgia, left ear: Secondary | ICD-10-CM | POA: Insufficient documentation

## 2020-07-04 DIAGNOSIS — J4520 Mild intermittent asthma, uncomplicated: Secondary | ICD-10-CM

## 2020-07-04 NOTE — Progress Notes (Signed)
    I connected with Alexa Woods on 07/04/20 at  4:00 PM EST by video and verified that I am speaking with the correct person using two identifiers.   I discussed the limitations, risks, security and privacy concerns of performing an evaluation and management service by video and the availability of in person appointments. I also discussed with the patient that there may be a patient responsible charge related to this service. The patient expressed understanding and agreed to proceed.  Patient location: Home Provider Location: [facility] Participants: Lesleigh Noe and Alexa Woods   Subjective:     Alexa Woods is a 60 y.o. female presenting for Ear Pain (L ) and Wheezing (Mostly at night, but states that she has asthma )     HPI   # Asthma - wheezing at night - typically needing inhaler during seasonal changes - last night asthma was bad - used inhaler with some improvement - still wheezing some - endorses sob  - still having some rib pain with deep breathing - no fevers - endorses some cough - phlegm - wheezing started on 07/03/2020  #Ear pain - left side - feels like something is in the ear - itching and achy pain - started 1 month ago  Sick contact: no  No loss of taste or smell  Review of Systems  HENT: Positive for congestion, ear pain (worse with swallowing) and postnasal drip. Negative for sore throat.      Social History   Tobacco Use  Smoking Status Former Smoker  . Years: 25.00  . Types: Cigarettes  . Quit date: 12/30/2001  . Years since quitting: 18.5  Smokeless Tobacco Never Used        Objective:   BP Readings from Last 3 Encounters:  06/06/20 120/78  01/17/20 134/85  11/27/19 128/80   Wt Readings from Last 3 Encounters:  06/06/20 256 lb 4 oz (116.2 kg)  11/27/19 253 lb (114.8 kg)  10/05/19 250 lb (113.4 kg)    Exam Speaking in complete sentences No acute distress A&O        Assessment & Plan:   Problem  List Items Addressed This Visit      Respiratory   Asthma - Primary    Slight flare in symptoms. Advised covid testing. May be related to increase in symptoms. Also recovering from Rib fractures. Encouraged scheduled albuterol for 1-2 days. Call if worsening or fevers.         Other   Left ear pain    Suspect ETD, however, unable to exam today. Notes some congestion, post nasal drip. Advised saline rinse and pseudoephedrine.         Encouraged covid testing Declined coming for drive up She will look into home test ER and return precautions   Interactive audio and video telecommunications were attempted between this provider and patient, however failed, due to patient having technical difficulties OR patient did not have access to video capability.  We continued and completed visit with audio only.   Start Time: 12:52 End Time: 1:01    No follow-ups on file.  Lesleigh Noe, MD\

## 2020-07-04 NOTE — Telephone Encounter (Signed)
Per appt notes pt has appt to see Dr Einar Pheasant 07/04/20 at 4 PM. Sending note to Dr Glori Bickers as PCP and Dr Einar Pheasant.

## 2020-07-04 NOTE — Assessment & Plan Note (Signed)
Suspect ETD, however, unable to exam today. Notes some congestion, post nasal drip. Advised saline rinse and pseudoephedrine.

## 2020-07-04 NOTE — Assessment & Plan Note (Signed)
Slight flare in symptoms. Advised covid testing. May be related to increase in symptoms. Also recovering from Rib fractures. Encouraged scheduled albuterol for 1-2 days. Call if worsening or fevers.

## 2020-07-04 NOTE — Telephone Encounter (Signed)
[facility] - Client TELEPHONE ADVICE RECORD [facility] Patient Name: Alexa Woods 51 Gender: Female DOB: [birth year] Age: 60 Y 3 M 17 D Return Phone Number: [phone] (Primary) Address: [address] City/State/Zip: [place] [address] Client [facility] - Client Client Site [place] MD Contact Type Call Who Is Calling Patient / Member / Family / Caregiver Call Type Triage / Clinical Relationship To Patient Self Return Phone Number [phone] (Primary) Chief Complaint BREATHING - shortness of breath or sounds breathless Reason for Call Symptomatic / Request for Health Information Initial Comment caller states having difficulty breathing, has broken ribs from a fall. fell on 05/24/20. SOB seemed worse last night Translation No Nurse Assessment Nurse: Chestine Spore, RN, [place] Date/Time (Eastern Time): 07/04/2020 10:28:53 AM Confirm and document reason for call. If symptomatic, describe symptoms. ---caller states having SOB with exertion and at rest. had a fall on 05/24/20. broke 2 ribs. seemed SOB was worse last night. Does the patient have any new or worsening symptoms? ---Yes Will a triage be completed? ---Yes Related visit to physician within the last 2 weeks? ---Yes Does the PT have any chronic conditions? (i.e. diabetes, asthma, this includes High risk factors for pregnancy, etc.) ---Yes List chronic conditions. ---diabetes Is this a behavioral health or substance abuse call? ---No Guidelines Guideline Title Affirmed Question Affirmed Notes Nurse Date/Time (Eastern Time) Breathing Difficulty Wheezing can be heard across the room [place], RN, [place] 07/04/2020 10:32:50 AM Chest Injury [1] Difficulty breathing AND [2] not severe Chestine Spore, RN, [place] 07/04/2020 10:42:24 AM Disp. Time [name] Time) Disposition Final User 07/04/2020 10:21:29 AM Send to  Urgent Queue Ilona Sorrel 07/04/2020 10:36:45 AM Go to ED Now Chestine Spore, RN, Reynold Bowen 07/04/2020 10:43:18 AM Go to ED Now Yes Chestine Spore, RN, Reynold Bowen PLEASE NOTE: All timestamps contained within this report are represented as [place] Standard Time. CONFIDENTIALTY NOTICE: This fax transmission is intended only for the addressee. It contains information that is legally privileged, confidential or otherwise protected from use or disclosure. If you are not the intended recipient, you are strictly prohibited from reviewing, disclosing, copying using or disseminating any of this information or taking any action in reliance on or regarding this information. If you have received this fax in error, please notify us immediately by telephone so that we can arrange for its return to [place]. Phone: [phone], Toll-Free: [phone], Fax: [phone] Page: 2 of 2 Call Id: [id number] [place] Disagree/Comply Comply Caller Understands Yes PreDisposition Call Doctor Care Advice Given Per Guideline GO TO ED NOW: * You need to be seen in the Emergency Department. NOTE TO TRIAGER - DRIVING: * Another adult should drive. CARE ADVICE given per Breathing Difficulty (Adult) guideline. GO TO ED NOW: * You need to be seen in the Emergency Department. CARE ADVICE given per Chest Injury (Adult) guideline. Comments User: [name], RN Date/Time [name] Time): 07/04/2020 10:43:56 AM backline scheduled appt for today at 4:00 PM. caller aware Referrals Warm transfer to backline Warm transfer to backline

## 2020-07-04 NOTE — Telephone Encounter (Signed)
See note from encounter today

## 2020-07-05 ENCOUNTER — Ambulatory Visit: Payer: PRIVATE HEALTH INSURANCE | Admitting: Family Medicine

## 2020-07-09 ENCOUNTER — Telehealth: Payer: Self-pay | Admitting: *Deleted

## 2020-07-09 MED ORDER — FLUCONAZOLE 150 MG PO TABS
ORAL_TABLET | ORAL | 0 refills | Status: DC
Start: 2020-07-09 — End: 2020-08-05

## 2020-07-09 NOTE — Telephone Encounter (Signed)
I sent diflucan to the pharmacy to take 1 pill every 3 days for 3 doses If no improvement please follow up in office

## 2020-07-09 NOTE — Telephone Encounter (Signed)
Left VM requesting pt to call the office back 

## 2020-07-09 NOTE — Telephone Encounter (Signed)
Patient called stating that she was given diflucan about a month ago for vaginal itching. Patient stated that the diflucan helped a lot but never completely cleared up the problem. Patient stated she started with vaginal itching again really bad last night. Patient stated that she has vaginal and rectal itching. Patent stated that she did notice a small amount of discharge. Patient stated that she has to wear a pad because her bladder leaks. Patient wants to know if Dr. Glori Bickers will prescribe more medication. Pharmacy [facility]

## 2020-07-10 NOTE — Telephone Encounter (Signed)
Pt notified of Dr. Marliss Coots comments and instructions

## 2020-07-22 ENCOUNTER — Telehealth: Payer: Self-pay

## 2020-07-22 NOTE — Telephone Encounter (Signed)
[facility] - Client TELEPHONE ADVICE RECORD [facility] Patient Name: Alexa Woods Gender: Female DOB: [birth year] Age: 60 Y 70 M 4 D Return Phone Number: [phone] (Primary) Address: [address] City/State/Zip: [place] [address] Client [facility] - Client Client Site [place] Physician Glori Bickers, Roque Lias - MD Contact Type Call Who Is Calling Patient / Member / Family / Caregiver Call Type Triage / Clinical Relationship To Patient Self Return Phone Number [phone] (Primary) Chief Complaint Toe Injury Reason for Call Symptomatic / Request for [name] states she is experiencing a toe injury with swelling. Translation No Nurse Assessment Nurse: Lucky Cowboy, RN, Levada Dy Date/Time (Eastern Time): 07/22/2020 1:25:41 PM Confirm and document reason for call. If symptomatic, describe symptoms. ---Caller stated that she injured her rt pinkie toe on 2/2. She is diabetic. No open areas to the toe. Does the patient have any new or worsening symptoms? ---Yes Will a triage be completed? ---Yes Related visit to physician within the last 2 weeks? ---Yes Does the PT have any chronic conditions? (i.e. diabetes, asthma, this includes High risk factors for pregnancy, etc.) ---Yes List chronic conditions. ---diabetes, asthma, thyroid, anxiety, psoriasis Is this a behavioral health or substance abuse call? ---No Guidelines Guideline Title Affirmed Question Affirmed Notes Nurse Date/Time (Eastern Time) Toe Injury [1] After 3 days AND [2] pain not improved Lucky Cowboy, RN, Levada Dy 07/22/2020 1:27:35 PM Disp. Time [name] Time) Disposition Final User 07/22/2020 1:30:17 PM SEE PCP WITHIN 3 DAYS Yes Dew, RN, Marin Shutter Disagree/Comply Comply Caller Understands Yes PreDisposition Did not know what to do Care Advice Given Per Guideline PLEASE NOTE: All timestamps contained within this  report are represented as [place] Standard Time. CONFIDENTIALTY NOTICE: This fax transmission is intended only for the addressee. It contains information that is legally privileged, confidential or otherwise protected from use or disclosure. If you are not the intended recipient, you are strictly prohibited from reviewing, disclosing, copying using or disseminating any of this information or taking any action in reliance on or regarding this information. If you have received this fax in error, please notify us immediately by telephone so that we can arrange for its return to [place]. Phone: [phone], Toll-Free: [phone], Fax: [phone] Page: 2 of 2 Call Id: [id number] Care Advice Given Per Guideline SEE PCP WITHIN 3 DAYS: * You need to be seen within 2 or 3 days. * PCP VISIT: Call your doctor (or NP/PA) during regular office hours and make an appointment. A clinic or urgent care center are good places to go for care if your doctor's office is closed or you can't get an appointment. NOTE: If office will be open tomorrow, tell caller to call then, not in 3 days. PAIN MEDICINES: * For pain relief, you can take either acetaminophen, ibuprofen, or naproxen. * They are over-the-counter (OTC) pain drugs. You can buy them at the drugstore. * Pain becomes worse CALL BACK IF: * You become worse CARE ADVICE given per Toe Injury (Adult) guideline. Referrals REFERRED TO PCP OFFICE

## 2020-07-22 NOTE — Telephone Encounter (Signed)
FYI: Error in Rena's message appt is scheduled on 07/24/20 not 09/21/20

## 2020-07-22 NOTE — Telephone Encounter (Signed)
Per appt notes pt already has appt with Dr Glori Bickers scheduled on 09/21/20 at 12 noon Alexa Woods said pt did not seem in any distress when scheduling appt. Sending note to Dr Glori Bickers and [place] CMA.

## 2020-07-24 ENCOUNTER — Other Ambulatory Visit: Payer: Self-pay

## 2020-07-24 ENCOUNTER — Ambulatory Visit (INDEPENDENT_AMBULATORY_CARE_PROVIDER_SITE_OTHER): Payer: PRIVATE HEALTH INSURANCE | Admitting: Family Medicine

## 2020-07-24 ENCOUNTER — Ambulatory Visit (INDEPENDENT_AMBULATORY_CARE_PROVIDER_SITE_OTHER)
Admission: RE | Admit: 2020-07-24 | Discharge: 2020-07-24 | Disposition: A | Discharge status: Home / Self Care | Payer: PRIVATE HEALTH INSURANCE | Source: Ambulatory Visit | Attending: Family Medicine | Admitting: Family Medicine

## 2020-07-24 ENCOUNTER — Encounter: Payer: Self-pay | Admitting: Family Medicine

## 2020-07-24 VITALS — BP 134/72 | HR 72 | Temp 96.9°F | Ht 62.25 in | Wt 258.1 lb

## 2020-07-24 DIAGNOSIS — M19071 Primary osteoarthritis, right ankle and foot: Secondary | ICD-10-CM | POA: Diagnosis not present

## 2020-07-24 DIAGNOSIS — S99921A Unspecified injury of right foot, initial encounter: Secondary | ICD-10-CM

## 2020-07-24 DIAGNOSIS — M7989 Other specified soft tissue disorders: Secondary | ICD-10-CM | POA: Diagnosis not present

## 2020-07-24 DIAGNOSIS — Z1231 Encounter for screening mammogram for malignant neoplasm of breast: Secondary | ICD-10-CM

## 2020-07-24 NOTE — Assessment & Plan Note (Signed)
Overdue for screening mammogram-wants to get  Still has R rib pain since fractures  Ref done for mammo at [facility]

## 2020-07-24 NOTE — Progress Notes (Signed)
Subjective:    Patient ID: Alexa Woods, female    DOB: 11-07-1960, 60 y.o.   MRN: [id number]  This visit occurred during the SARS-CoV-2 public health emergency.  Safety protocols were in place, including screening questions prior to the visit, additional usage of staff PPE, and extensive cleaning of exam room while observing appropriate contact time as indicated for disinfecting solutions.    HPI Pt presents for toe injury and swelling  Injured her R 5th toe on 2/2     Wt Readings from Last 3 Encounters:  07/24/20 258 lb 1 oz (117.1 kg)  06/06/20 256 lb 4 oz (116.2 kg)  11/27/19 253 lb (114.8 kg)   46.82 kg/m  She stubbed her R 5th toe on a chair  Very painful  Throbbing  Ice and elevated   [facility]  Now still painful  Still swollen   Note from 2/2  1. Sprain of interphalangeal joint of toe  The diagnosis and treatment options for their condition were discussed in detail. The symptoms are consistent with right fifth toe sprain. Conservative treatment options including rest, ice/heat, elevation, over the counter NSAIDs/Tylenol were discussed. postop shoe was provided today for comfort with ambulation to wear over the next 3-4 weeks. resume regular shoe wear and increase activity as tolerated after 3-4 weeks. Buddy taping for the fourth and fifth toes as needed. Follow up as needed. Multiple questions were answered and they are comfortable with the above plan.     She is wearing a post op shoe   I do not see the report in care everywhere    R ribs still hurt from prior fx  Has not had a mammogram lately (only one in the past)  No lumps on self exam   Sugar control is ok but some low glucose levels -working on med adj   Patient Active Problem List   Diagnosis Date Noted  . Right foot injury 07/24/2020  . Left ear pain 07/04/2020  . Rib fractures 06/06/2020  . Hydrocephalus ([place]) 09/05/2019  . Dry eyes 09/05/2019  . Intractable  headache 08/25/2019  . Intractable nausea and vomiting 08/25/2019  . Hypothyroidism 04/04/2019  . Screening mammogram, encounter for 04/03/2019  . Routine general medical examination at a health care facility 04/03/2019  . Encounter for screening for HIV 04/03/2019  . Encounter for hepatitis C screening test for low risk patient 04/03/2019  . Visit for routine gyn exam 04/03/2019  . Vitamin D deficiency 04/03/2019  . Proximal humerus fracture 02/21/2019  . Intertrigo 02/21/2019  . History of left knee replacement 12/26/2017  . Primary osteoarthritis involving multiple joints 12/13/2017  . Primary osteoarthritis of left knee 12/06/2017  . Chronic neck pain 08/13/2017  . Pre-operative exam 04/05/2017  . Class 3 obesity with serious comorbidity and body mass index (BMI) of 40.0 to 44.9 in adult 01/21/2017  . Dysuria 08/02/2015  . Complicated UTI (urinary tract infection) 05/09/2014  . Joint swelling 12/20/2013  . Periodontal disease 02/03/2013  . Hyperlipidemia associated with type 2 diabetes mellitus ([place]) 09/27/2012  . Hyperlipidemia 09/27/2012  . DYSPHAGIA UNSPECIFIED 08/07/2010  . Low back pain 07/22/2010  . 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  . Type 2 diabetes mellitus with peripheral neuropathy ([place]) 11/30/2006  . History of alcohol abuse 11/30/2006  . Depression with anxiety 11/30/2006  . Essential hypertension 11/30/2006  . HEMORRHOIDS 11/30/2006  . Allergic rhinitis 11/30/2006  . Asthma 11/30/2006  . GERD without esophagitis 11/30/2006  .  Psoriasis 11/30/2006   Past Medical History:  Diagnosis Date  . Alcohol abuse, in remission    since 1998  . Allergic rhinitis   . Anxiety   . Asthma   . Bilateral lower extremity edema   . Bulging lumbar disc   . Bulging of cervical intervertebral disc   . Chronic constipation   . DDD (degenerative disc disease), lumbosacral   . Depression   . Dyspnea   . GERD (gastroesophageal reflux disease)   . Hemorrhoids   . History  of Bell's palsy 08/2007   left  . History of chronic cystitis   . IBS (irritable bowel syndrome)   . Insulin dependent type 2 diabetes mellitus [facility])    endocrinologist-- dr Cruzita Lederer  . Lower urinary tract symptoms (LUTS)   . OA (osteoarthritis)    knees, back, hands, elbows  . OSA (obstructive sleep apnea)    per study 06-08-2017 mild osa , cpap recommended , per pt insurance issue  . Peripheral neuropathy   . PONV (postoperative nausea and vomiting)   . Psoriasis   . S/P dilatation of esophageal stricture   . Unspecified essential hypertension   . Wears partial dentures    lower   Past Surgical History:  Procedure Laterality Date  . CHOLECYSTECTOMY OPEN  1990   AND APPENDECTOMY  . DOBUTAMINE STRESS ECHO  2009    normal stress echo, no evidence of ischemia  . ELBOW SURGERY Bilateral RIGHT 2013;  LEFT 2016   for nerve damage  . ESOPHAGOGASTRODUODENOSCOPY  07/2002   erythematous gastropathy  . KNEE ARTHROSCOPY Left 11/ 2018   dr Wynelle Link  @ [facility]  . KNEE ARTHROSCOPY W/ PARTIAL MEDIAL MENISCECTOMY Right 10/2008   and chondroplasty  . SHOULDER ARTHROSCOPY WITH DISTAL CLAVICLE RESECTION Left 12/ 2011   dr dean  . TOTAL KNEE ARTHROPLASTY Right 07-01-2009  dr Wynelle Link   [facility]  . TOTAL KNEE ARTHROPLASTY Left 12/06/2017   Procedure: LEFT TOTAL LEFT KNEE ARTHROPLASTY;  Surgeon: Gaynelle Arabian, MD;  Location: [facility];  Service: Orthopedics;  Laterality: Left;  Marland Kitchen VAGINAL HYSTERECTOMY  2003   Social History   Tobacco Use  . Smoking status: Former Smoker    Years: 25.00    Types: Cigarettes    Quit date: 12/30/2001    Years since quitting: 18.5  . Smokeless tobacco: Never Used  Vaping Use  . Vaping Use: Never used  Substance Use Topics  . Alcohol use: No    Alcohol/week: 0.0 standard drinks    Comment: hx alcoholism--  stopped 1998   . Drug use: No   Family History  Problem Relation Age of Onset  . Alcohol abuse Mother   . Hypertension Mother   . Esophageal cancer Maternal  Grandmother   . Lung cancer Other        mat great uncle  . Colon cancer Neg Hx    Allergies  Allergen Reactions  . Bupropion Other (See Comments)    Pt is unsure  . Bupropion Hcl Other (See Comments)    Pt is unsure  . Cefuroxime Axetil Nausea Only  . Gabapentin Other (See Comments)    Pt does not remember reaction  Pt does not remember reaction   . Lidocaine Other (See Comments)    REACTION: unknown REACTION: unknown  . Metformin Other (See Comments)    REACTION: GI REACTION: GI  . Other Other (See Comments)  . Paroxetine Other (See Comments)    REACTION: doesn't agree REACTION: doesn't agree  .  Propoxyphene Other (See Comments)    wheezing wheezing  . Tramadol Other (See Comments)    REACTION: Causes Anxiety  . Tramadol Hcl     REACTION: Causes Anxiety  . Codeine Nausea And Vomiting and Rash  . Sulfa Antibiotics Rash and Other (See Comments)   Current Outpatient Medications on File Prior to Visit  Medication Sig Dispense Refill  . acetaminophen (TYLENOL) 325 MG tablet Take 2 tablets (650 mg total) by mouth every 4 (four) hours as needed for mild pain or headache (or Fever >/= 101). 30 tablet 0  . albuterol (VENTOLIN HFA) 108 (90 Base) MCG/ACT inhaler INHALE 2 PUFFS INTO THE LUNGS EVERY 4 (FOUR) HOURS AS NEEDED FOR WHEEZING OR SHORTNESS OF BREATH. 18 each 3  . BD INSULIN SYRINGE U/F 31G X 5/16" 0.5 ML MISC USE THREE TIMES PER DAY WITH R INSULIN & ONCE DAILY WITH LEVEMIR 300 each 3  . beclomethasone (QVAR) 80 MCG/ACT inhaler Inhale 2 puffs 2 (two) times daily into the lungs. Rinse mouth after use. 1 Inhaler 5  . docusate sodium (COLACE) 100 MG capsule Take 100 mg by mouth every morning.     . fluconazole (DIFLUCAN) 150 MG tablet Take one tablet by mouth every 3 days 3 tablet 0  . fluticasone (FLONASE) 50 MCG/ACT nasal spray Place 2 sprays into both nostrils daily as needed for allergies or rhinitis. 16 g 5  . hydrochlorothiazide (HYDRODIURIL) 25 MG tablet TAKE 1 TABLET BY  MOUTH EVERY DAY 90 tablet 3  . insulin glargine (LANTUS) 100 UNIT/ML injection Inject 0.35 mLs (35 Units total) into the skin daily. (Patient taking differently: Inject 30 Units into the skin daily.) 40 mL 3  . insulin regular (HUMULIN R) 100 units/mL injection INJECT 0.14-0.24ML'S (14 TO 24 UNITS TOTAL) INTO THE SKIN THREE TIMES A DAY BEFORE MEALS. 20 mL 9  . Lancets (ONETOUCH ULTRASOFT) lancets Use to test blood sugar 4 times daily as instructed. 200 each 11  . levothyroxine (SYNTHROID) 75 MCG tablet TAKE 1 TABLET (75 MCG TOTAL) BY MOUTH DAILY BEFORE BREAKFAST. 90 tablet 0  . LORazepam (ATIVAN) 1 MG tablet Take 2 mg by mouth 2 (two) times daily as needed for anxiety or sleep.    . naloxone (NARCAN) 4 MG/0.1ML LIQD nasal spray kit Narcan 4 mg/actuation nasal spray    . ONETOUCH ULTRA test strip USE TO TEST BLOOD SUGAR 4 TIMES DAILY AS INSTRUCTED. 400 strip 11  . oxyCODONE-acetaminophen (PERCOCET) 10-325 MG tablet Take 1 tablet by mouth every 4 (four) hours as needed for pain. Hold for SBP < = 105 12 tablet 0  . pantoprazole (PROTONIX) 40 MG tablet Take 1 tablet (40 mg total) by mouth 2 (two) times daily. 180 tablet 1  . polyethylene glycol (MIRALAX / GLYCOLAX) packet Take 17 g by mouth daily as needed for mild constipation or moderate constipation.     . potassium chloride (KLOR-CON M10) 10 MEQ tablet Take 1 tablet (10 mEq total) by mouth daily. 90 tablet 1  . senna (SENOKOT) 8.6 MG TABS tablet Take 1 tablet by mouth daily.     . sertraline (ZOLOFT) 100 MG tablet Take 200 mg by mouth every morning.    Marland Kitchen tiZANidine (ZANAFLEX) 4 MG tablet Take 4 mg by mouth 2 (two) times daily as needed for muscle spasms.     Marland Kitchen triamcinolone cream (KENALOG) 0.1 % Apply 1 application topically 2 (two) times daily. To psoriasis areas 30 g 0  . zolpidem (AMBIEN) 10 MG tablet Take  10 mg by mouth at bedtime as needed for sleep.     No current facility-administered medications on file prior to visit.    Review of  Systems  Constitutional: Negative for activity change, appetite change, fatigue, fever and unexpected weight change.  HENT: Negative for congestion, ear pain, rhinorrhea, sinus pressure and sore throat.   Eyes: Negative for pain, redness and visual disturbance.  Respiratory: Negative for cough, shortness of breath and wheezing.   Cardiovascular: Negative for chest pain and palpitations.  Gastrointestinal: Negative for abdominal pain, blood in stool, constipation and diarrhea.  Endocrine: Negative for polydipsia and polyuria.  Genitourinary: Negative for dysuria, frequency and urgency.  Musculoskeletal: Positive for arthralgias, back pain and joint swelling. Negative for myalgias.  Skin: Negative for pallor and rash.  Allergic/Immunologic: Negative for environmental allergies.  Neurological: Negative for dizziness, syncope and headaches.  Hematological: Negative for adenopathy. Does not bruise/bleed easily.  Psychiatric/Behavioral: Negative for decreased concentration and dysphoric mood. The patient is not nervous/anxious.        Objective:   Physical Exam Constitutional:      General: She is not in acute distress.    Appearance: Normal appearance. She is obese. She is not ill-appearing.  HENT:     Head: Normocephalic and atraumatic.  Eyes:     General: No scleral icterus.    Conjunctiva/sclera: Conjunctivae normal.     Pupils: Pupils are equal, round, and reactive to light.  Cardiovascular:     Rate and Rhythm: Normal rate and regular rhythm.     Heart sounds: Normal heart sounds.  Pulmonary:     Effort: Pulmonary effort is normal.     Breath sounds: Normal breath sounds.  Musculoskeletal:     Cervical back: Neck supple.     Right foot: Normal range of motion and normal capillary refill. Swelling, tenderness and bony tenderness present. No deformity, Charcot foot, prominent metatarsal heads or crepitus. Normal pulse.     Left foot: Normal.     Comments: Right 5th toe is  moderately swollen and pink in color  Nail is thickened ,not loose No wound or ingrowing  Nl rom of toe  Tender to palpation  Also distal 5th metatarsal is tender  No step off or crepitus   Lymphadenopathy:     Cervical: No cervical adenopathy.  Neurological:     Mental Status: She is alert.           Assessment & Plan:   Problem List Items Addressed This Visit      Other   Screening mammogram, encounter for    Overdue for screening mammogram-wants to get  Still has R rib pain since fractures  Ref done for mammo at [facility]       Relevant Orders   MM 3D SCREEN BREAST BILATERAL   Right foot injury - Primary    Painful 5th toe and lateral foot after a stubbing injury early this mo  [facility] gave her a surgical shoe and she thinks took xray (result not avail) Her toe remains swollen with some redness  Xray ordered today  Adv ice/elevate/ortho shoe Pend result for plan      Relevant Orders   DG Foot Complete Right

## 2020-07-24 NOTE — Patient Instructions (Addendum)
Call and schedule your mammogram   Let's xray your foot today  Continue the orthopedic shoe and ice and elevation  Keep foot clean with soap and water   Take care of yourself

## 2020-07-24 NOTE — Assessment & Plan Note (Signed)
Painful 5th toe and lateral foot after a stubbing injury early this mo  [facility] gave her a surgical shoe and she thinks took xray (result not avail) Her toe remains swollen with some redness  Xray ordered today  Adv ice/elevate/ortho shoe Pend result for plan

## 2020-07-25 ENCOUNTER — Other Ambulatory Visit: Payer: Self-pay | Admitting: Family Medicine

## 2020-07-26 ENCOUNTER — Telehealth: Payer: Self-pay | Admitting: *Deleted

## 2020-07-26 NOTE — Telephone Encounter (Signed)
Left VM requesting pt to call the office back 

## 2020-07-26 NOTE — Telephone Encounter (Signed)
-----   Message from Abner Greenspan, MD sent at 07/25/2020  5:02 PM EST ----- Xray cannot rule out a fracture of the pinkie toe - may also have some degenerative change  Would she be open to seeing a podiatrist to see what could help?

## 2020-07-26 NOTE — Telephone Encounter (Signed)
Addressed through result notes  

## 2020-07-28 ENCOUNTER — Telehealth: Payer: Self-pay | Admitting: Family Medicine

## 2020-07-28 DIAGNOSIS — S99921A Unspecified injury of right foot, initial encounter: Secondary | ICD-10-CM

## 2020-07-28 NOTE — Telephone Encounter (Signed)
-----   Message from Tammi Sou, [place] sent at 07/26/2020 12:49 PM EST ----- Pt notified of xray results and Dr. Marliss Coots comments. Pt agrees with referral. She said her foot is still red, swollen and painful so she would like to see whoever can get her in the soonest, [place] or [place] is fine

## 2020-07-28 NOTE — Telephone Encounter (Signed)
Referral done

## 2020-08-05 ENCOUNTER — Ambulatory Visit (INDEPENDENT_AMBULATORY_CARE_PROVIDER_SITE_OTHER): Payer: Medicare HMO | Admitting: Podiatry

## 2020-08-05 ENCOUNTER — Other Ambulatory Visit: Payer: Self-pay

## 2020-08-05 ENCOUNTER — Encounter: Payer: Self-pay | Admitting: Podiatry

## 2020-08-05 DIAGNOSIS — S92534A Nondisplaced fracture of distal phalanx of right lesser toe(s), initial encounter for closed fracture: Secondary | ICD-10-CM | POA: Diagnosis not present

## 2020-08-05 DIAGNOSIS — S99921A Unspecified injury of right foot, initial encounter: Secondary | ICD-10-CM | POA: Diagnosis not present

## 2020-08-05 NOTE — Progress Notes (Signed)
  Subjective:  Patient ID: Alexa Woods, female    DOB: [birth year],  MRN: [id number]  Chief Complaint  Patient presents with  . Toe Injury    Patient says she hit her 5th right toe on a chair back on 07/02/20 and was seen at urgent care.  Xray done was negative.  She is still having a lot of pain, burning and stinging, swelling and redness in her 5th toe    60 y.o. female presents with the above complaint. History confirmed with patient.  This is 1 in a series of injury she has had including multiple falls over the last year  Objective:  Physical Exam: warm, good capillary refill, no trophic changes or ulcerative lesions and normal DP and PT pulses.  She has reduced protective sensation  Right Foot: Erythema and edema and pain on palpation of the fifth toe, minimal on the fifth metatarsal  Radiographs: X-ray of the right foot: Reviewed x-rays from her PCP office dated 2/22, she does have a fracture of the distal phalanx of the fifth toe Assessment:   1. Injury of toe on right foot, initial encounter   2. Closed nondisplaced fracture of distal phalanx of lesser toe of right foot, initial encounter      Plan:  Patient was evaluated and treated and all questions answered.  Reviewed the x-ray findings and etiology and treatment options for toe fractures with her.  I recommended buddy taping and should I do this with [place] and dispensed him to her to do this.  I think she should continue with the surgical shoe, I advised her that this will likely take several weeks to continue to get better because she is diabetic.  She said her A1c is "up-and-down".  She does have a problem with falls recently, if this is a persistent issue after we reevaluate her in a month and a half we may want to consider physical therapy for balance and gait training.  Return in about 6 weeks (around 09/16/2020) for re-check toe fracture, take xrays if painful.

## 2020-08-19 DIAGNOSIS — M79674 Pain in right toe(s): Secondary | ICD-10-CM | POA: Diagnosis not present

## 2020-08-19 DIAGNOSIS — M25531 Pain in right wrist: Secondary | ICD-10-CM | POA: Insufficient documentation

## 2020-08-30 ENCOUNTER — Telehealth: Payer: Self-pay

## 2020-08-30 NOTE — Telephone Encounter (Signed)
Pt left v/m requesting phone # for podiatrist pt saw; Dr Sherryle Lis in [place]. I called pt back with phone # [phone]. Pt said she had found her paperwork and had left v/m for podiatrist office to call pt back; pt said the toe she saw podiatrist with on 08/05/20 is not better. Pt said nothing further needed at this time but is waiting on cb from podiatrist. Will send FYI to Dr Glori Bickers.

## 2020-09-16 ENCOUNTER — Ambulatory Visit (INDEPENDENT_AMBULATORY_CARE_PROVIDER_SITE_OTHER): Payer: Medicare HMO | Admitting: Podiatry

## 2020-09-16 ENCOUNTER — Other Ambulatory Visit: Payer: Self-pay

## 2020-09-16 ENCOUNTER — Encounter: Payer: Self-pay | Admitting: Podiatry

## 2020-09-16 ENCOUNTER — Ambulatory Visit (INDEPENDENT_AMBULATORY_CARE_PROVIDER_SITE_OTHER): Payer: PRIVATE HEALTH INSURANCE

## 2020-09-16 DIAGNOSIS — S99921D Unspecified injury of right foot, subsequent encounter: Secondary | ICD-10-CM | POA: Diagnosis not present

## 2020-09-16 DIAGNOSIS — S92534D Nondisplaced fracture of distal phalanx of right lesser toe(s), subsequent encounter for fracture with routine healing: Secondary | ICD-10-CM

## 2020-09-16 DIAGNOSIS — S92534A Nondisplaced fracture of distal phalanx of right lesser toe(s), initial encounter for closed fracture: Secondary | ICD-10-CM

## 2020-09-17 DIAGNOSIS — F250 Schizoaffective disorder, bipolar type: Secondary | ICD-10-CM | POA: Diagnosis not present

## 2020-09-18 DIAGNOSIS — S92534A Nondisplaced fracture of distal phalanx of right lesser toe(s), initial encounter for closed fracture: Secondary | ICD-10-CM | POA: Diagnosis not present

## 2020-09-19 DIAGNOSIS — M79641 Pain in right hand: Secondary | ICD-10-CM | POA: Diagnosis not present

## 2020-09-19 LAB — CBC WITH DIFFERENTIAL/PLATELET
Basophils Absolute: 0.1 10*3/uL (ref 0.0–0.2)
Basos: 1 %
EOS (ABSOLUTE): 0.1 10*3/uL (ref 0.0–0.4)
Eos: 2 %
Hematocrit: 39.9 % (ref 34.0–46.6)
Hemoglobin: 13.7 g/dL (ref 11.1–15.9)
Immature Grans (Abs): 0 10*3/uL (ref 0.0–0.1)
Immature Granulocytes: 0 %
Lymphocytes Absolute: 2 10*3/uL (ref 0.7–3.1)
Lymphs: 26 %
MCH: 28.1 pg (ref 26.6–33.0)
MCHC: 34.3 g/dL (ref 31.5–35.7)
MCV: 82 fL (ref 79–97)
Monocytes Absolute: 0.4 10*3/uL (ref 0.1–0.9)
Monocytes: 6 %
Neutrophils Absolute: 5.1 10*3/uL (ref 1.4–7.0)
Neutrophils: 65 %
Platelets: 182 10*3/uL (ref 150–450)
RBC: 4.87 x10E6/uL (ref 3.77–5.28)
RDW: 12.8 % (ref 11.7–15.4)
WBC: 7.7 10*3/uL (ref 3.4–10.8)

## 2020-09-19 LAB — ARTHRITIS PANEL
Anti Nuclear Antibody (ANA): NEGATIVE
Rheumatoid fact SerPl-aCnc: <10 IU/mL (ref <14.0)
Sed Rate: 63 mm/hr — ABNORMAL HIGH (ref 0–40)
Uric Acid: 5 mg/dL (ref 3.0–7.2)

## 2020-09-19 NOTE — Progress Notes (Signed)
  Subjective:  Patient ID: Alexa Woods, female    DOB: [birth year],  MRN: [id number]  Chief Complaint  Patient presents with  . Fracture    "its a little better, but still swells, stays red and bothers me"    60 y.o. female returns with the above complaint. History confirmed with patient.  Has had some improvement  Objective:  Physical Exam: warm, good capillary refill, no trophic changes or ulcerative lesions and normal DP and PT pulses.  She has reduced protective sensation  Right Foot: Still with edema edema and minimal pain on palpation of the fifth toe metatarsal  Radiographs: X-ray of the right foot: New x-rays taken show persistent fracture at distal phalanx of fifth toe Assessment:   1. Closed nondisplaced fracture of distal phalanx of lesser toe of right foot with routine healing, subsequent encounter   2. Injury of toe on right foot, subsequent encounter      Plan:  Patient was evaluated and treated and all questions answered.  She still has persistent edema and pain.  I like to order some lab work to rule out this being a dactylitis or other arthritis developing such as gout.  Arthritis panel, CBC and ESR was ordered  Return in about 8 weeks (around 11/11/2020) for re-check right 5th toe fracture, xrays if still painful .

## 2020-09-20 ENCOUNTER — Other Ambulatory Visit: Payer: Self-pay | Admitting: Internal Medicine

## 2020-09-20 ENCOUNTER — Other Ambulatory Visit: Payer: Self-pay | Admitting: Family Medicine

## 2020-09-20 NOTE — Telephone Encounter (Signed)
Pharmacy requests refill on: Levothyroxine 75 mcg   LAST REFILL: 06/25/2020 (Q-90, R-0) LAST OV: 07/24/2020 NEXT OV: Not Scheduled  PHARMACY: [id number] [place], [place]  TSH (08/25/2019): 1.821  Pharmacy requests refill on: Klor Con M10  LAST REFILL: 06/05/2020 (Q-90, R-1) LAST OV: 07/24/2020 NEXT OV: Not Scheduled  PHARMACY: [id number] [place], [place]

## 2020-09-27 ENCOUNTER — Other Ambulatory Visit: Payer: Self-pay | Admitting: Family Medicine

## 2020-09-30 ENCOUNTER — Other Ambulatory Visit: Payer: Self-pay | Admitting: Family Medicine

## 2020-09-30 DIAGNOSIS — Z1231 Encounter for screening mammogram for malignant neoplasm of breast: Secondary | ICD-10-CM

## 2020-10-05 ENCOUNTER — Ambulatory Visit
Admission: RE | Admit: 2020-10-05 | Discharge: 2020-10-05 | Disposition: A | Discharge status: Home / Self Care | Payer: PRIVATE HEALTH INSURANCE | Source: Ambulatory Visit | Attending: Family Medicine | Admitting: Family Medicine

## 2020-10-05 DIAGNOSIS — Z1231 Encounter for screening mammogram for malignant neoplasm of breast: Secondary | ICD-10-CM

## 2020-10-07 DIAGNOSIS — S92524A Nondisplaced fracture of medial phalanx of right lesser toe(s), initial encounter for closed fracture: Secondary | ICD-10-CM | POA: Diagnosis not present

## 2020-10-07 DIAGNOSIS — S92919A Unspecified fracture of unspecified toe(s), initial encounter for closed fracture: Secondary | ICD-10-CM | POA: Insufficient documentation

## 2020-10-14 NOTE — Telephone Encounter (Signed)
Pt scheduled for fasting labs on 6/7 and physical on 6/10

## 2020-10-18 ENCOUNTER — Other Ambulatory Visit: Payer: Self-pay | Admitting: Family Medicine

## 2020-10-21 ENCOUNTER — Encounter: Payer: Self-pay | Admitting: Internal Medicine

## 2020-10-21 ENCOUNTER — Ambulatory Visit (INDEPENDENT_AMBULATORY_CARE_PROVIDER_SITE_OTHER): Payer: PRIVATE HEALTH INSURANCE | Admitting: Internal Medicine

## 2020-10-21 ENCOUNTER — Other Ambulatory Visit: Payer: Self-pay

## 2020-10-21 VITALS — BP 128/78 | HR 69 | Ht 62.2 in | Wt 258.8 lb

## 2020-10-21 DIAGNOSIS — Z794 Long term (current) use of insulin: Secondary | ICD-10-CM | POA: Diagnosis not present

## 2020-10-21 DIAGNOSIS — E559 Vitamin D deficiency, unspecified: Secondary | ICD-10-CM | POA: Diagnosis not present

## 2020-10-21 DIAGNOSIS — E1159 Type 2 diabetes mellitus with other circulatory complications: Secondary | ICD-10-CM

## 2020-10-21 DIAGNOSIS — E1142 Type 2 diabetes mellitus with diabetic polyneuropathy: Secondary | ICD-10-CM | POA: Diagnosis not present

## 2020-10-21 DIAGNOSIS — Z96651 Presence of right artificial knee joint: Secondary | ICD-10-CM | POA: Insufficient documentation

## 2020-10-21 LAB — POCT GLYCOSYLATED HEMOGLOBIN (HGB A1C): Hemoglobin A1C: 8.2 % — AB (ref 4.0–5.6)

## 2020-10-21 MED ORDER — INSULIN GLARGINE 100 UNIT/ML ~~LOC~~ SOLN: SUBCUTANEOUS | 3 refills | Status: DC
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
## 2020-10-21 NOTE — Patient Instructions (Addendum)
Please increase: - Lantus 30 units at bedtime  Change: - Humulin R insulin:  12-15 units before b'fast  12-15 units before lunch 10-13 units before dinner If you plan to be more active after a meal, please reduce the insulin before that meal by 5 units.  Please stop at the lab.  Please come back for a follow-up appointment in 4 months.

## 2020-10-22 LAB — VITAMIN D 25 HYDROXY (VIT D DEFICIENCY, FRACTURES): Vit D, 25-Hydroxy: 31 ng/mL (ref 30.0–100.0)

## 2020-11-01 ENCOUNTER — Other Ambulatory Visit: Payer: Self-pay | Admitting: Family Medicine

## 2020-11-04 ENCOUNTER — Telehealth: Payer: Self-pay | Admitting: Family Medicine

## 2020-11-04 DIAGNOSIS — E559 Vitamin D deficiency, unspecified: Secondary | ICD-10-CM

## 2020-11-04 DIAGNOSIS — E785 Hyperlipidemia, unspecified: Secondary | ICD-10-CM

## 2020-11-04 DIAGNOSIS — I10 Essential (primary) hypertension: Secondary | ICD-10-CM

## 2020-11-04 DIAGNOSIS — E1142 Type 2 diabetes mellitus with diabetic polyneuropathy: Secondary | ICD-10-CM

## 2020-11-04 DIAGNOSIS — Z79899 Other long term (current) drug therapy: Secondary | ICD-10-CM | POA: Insufficient documentation

## 2020-11-04 DIAGNOSIS — E039 Hypothyroidism, unspecified: Secondary | ICD-10-CM

## 2020-11-04 DIAGNOSIS — E1169 Type 2 diabetes mellitus with other specified complication: Secondary | ICD-10-CM

## 2020-11-04 NOTE — Telephone Encounter (Signed)
-----   Message from Cloyd Stagers, RT sent at 10/21/2020 11:32 AM EDT ----- Regarding: Lab Orders for Tuesday 6.7.2022 Please place lab orders for Tuesday 6.7.2022, office visit for physical on Friday 6.10.2022 Thank you, Dyke Maes RT(R)

## 2020-11-05 ENCOUNTER — Other Ambulatory Visit: Payer: PRIVATE HEALTH INSURANCE

## 2020-11-06 ENCOUNTER — Other Ambulatory Visit: Payer: Self-pay

## 2020-11-06 ENCOUNTER — Other Ambulatory Visit (INDEPENDENT_AMBULATORY_CARE_PROVIDER_SITE_OTHER): Payer: PRIVATE HEALTH INSURANCE

## 2020-11-06 DIAGNOSIS — E785 Hyperlipidemia, unspecified: Secondary | ICD-10-CM

## 2020-11-06 DIAGNOSIS — E1169 Type 2 diabetes mellitus with other specified complication: Secondary | ICD-10-CM

## 2020-11-06 DIAGNOSIS — E039 Hypothyroidism, unspecified: Secondary | ICD-10-CM

## 2020-11-06 DIAGNOSIS — E559 Vitamin D deficiency, unspecified: Secondary | ICD-10-CM | POA: Diagnosis not present

## 2020-11-06 DIAGNOSIS — Z79899 Other long term (current) drug therapy: Secondary | ICD-10-CM

## 2020-11-06 DIAGNOSIS — I10 Essential (primary) hypertension: Secondary | ICD-10-CM | POA: Diagnosis not present

## 2020-11-07 LAB — COMPREHENSIVE METABOLIC PANEL
ALT: 21 U/L (ref 0–35)
AST: 23 U/L (ref 0–37)
Albumin: 4.2 g/dL (ref 3.5–5.2)
Alkaline Phosphatase: 150 U/L — ABNORMAL HIGH (ref 39–117)
BUN: 20 mg/dL (ref 6–23)
CO2: 30 mEq/L (ref 19–32)
Calcium: 9.4 mg/dL (ref 8.4–10.5)
Chloride: 102 mEq/L (ref 96–112)
Creatinine, Ser: 1.06 mg/dL (ref 0.40–1.20)
GFR: 58.26 mL/min — ABNORMAL LOW (ref >60.00)
Glucose, Bld: 198 mg/dL — ABNORMAL HIGH (ref 70–99)
Potassium: 3.9 mEq/L (ref 3.5–5.1)
Sodium: 140 mEq/L (ref 135–145)
Total Bilirubin: 0.4 mg/dL (ref 0.2–1.2)
Total Protein: 7.5 g/dL (ref 6.0–8.3)

## 2020-11-07 LAB — LIPID PANEL
Cholesterol: 203 mg/dL — ABNORMAL HIGH (ref 0–200)
HDL: 66 mg/dL (ref >39.00)
NonHDL: 136.54
Total CHOL/HDL Ratio: 3
Triglycerides: 266 mg/dL — ABNORMAL HIGH (ref 0.0–149.0)
VLDL: 53.2 mg/dL — ABNORMAL HIGH (ref 0.0–40.0)

## 2020-11-07 LAB — CBC WITH DIFFERENTIAL/PLATELET
Basophils Absolute: 0.1 10*3/uL (ref 0.0–0.1)
Basophils Relative: 1.1 % (ref 0.0–3.0)
Eosinophils Absolute: 0.1 10*3/uL (ref 0.0–0.7)
Eosinophils Relative: 1.4 % (ref 0.0–5.0)
HCT: 38.6 % (ref 36.0–46.0)
Hemoglobin: 13.1 g/dL (ref 12.0–15.0)
Lymphocytes Relative: 26.5 % (ref 12.0–46.0)
Lymphs Abs: 2.4 10*3/uL (ref 0.7–4.0)
MCHC: 34 g/dL (ref 30.0–36.0)
MCV: 82.1 fl (ref 78.0–100.0)
Monocytes Absolute: 0.5 10*3/uL (ref 0.1–1.0)
Monocytes Relative: 5.6 % (ref 3.0–12.0)
Neutro Abs: 5.8 10*3/uL (ref 1.4–7.7)
Neutrophils Relative %: 65.4 % (ref 43.0–77.0)
Platelets: 211 10*3/uL (ref 150.0–400.0)
RBC: 4.7 Mil/uL (ref 3.87–5.11)
RDW: 13.5 % (ref 11.5–15.5)
WBC: 8.9 10*3/uL (ref 4.0–10.5)

## 2020-11-07 LAB — TSH: TSH: 5.46 u[IU]/mL — ABNORMAL HIGH (ref 0.35–4.50)

## 2020-11-07 LAB — LDL CHOLESTEROL, DIRECT: Direct LDL: 97 mg/dL

## 2020-11-07 LAB — VITAMIN B12: Vitamin B-12: 425 pg/mL (ref 211–911)

## 2020-11-07 LAB — VITAMIN D 25 HYDROXY (VIT D DEFICIENCY, FRACTURES): VITD: 26.06 ng/mL — ABNORMAL LOW (ref 30.00–100.00)

## 2020-11-08 ENCOUNTER — Other Ambulatory Visit: Payer: Self-pay

## 2020-11-08 ENCOUNTER — Ambulatory Visit (INDEPENDENT_AMBULATORY_CARE_PROVIDER_SITE_OTHER): Payer: PRIVATE HEALTH INSURANCE | Admitting: Family Medicine

## 2020-11-08 ENCOUNTER — Encounter: Payer: Self-pay | Admitting: Family Medicine

## 2020-11-08 VITALS — BP 126/66 | HR 81 | Temp 97.0°F | Ht 65.25 in | Wt 257.1 lb

## 2020-11-08 DIAGNOSIS — K219 Gastro-esophageal reflux disease without esophagitis: Secondary | ICD-10-CM | POA: Diagnosis not present

## 2020-11-08 DIAGNOSIS — Z0000 Encounter for general adult medical examination without abnormal findings: Secondary | ICD-10-CM | POA: Diagnosis not present

## 2020-11-08 DIAGNOSIS — E1169 Type 2 diabetes mellitus with other specified complication: Secondary | ICD-10-CM

## 2020-11-08 DIAGNOSIS — I10 Essential (primary) hypertension: Secondary | ICD-10-CM

## 2020-11-08 DIAGNOSIS — E039 Hypothyroidism, unspecified: Secondary | ICD-10-CM

## 2020-11-08 DIAGNOSIS — Z1211 Encounter for screening for malignant neoplasm of colon: Secondary | ICD-10-CM

## 2020-11-08 DIAGNOSIS — Z79899 Other long term (current) drug therapy: Secondary | ICD-10-CM

## 2020-11-08 DIAGNOSIS — F418 Other specified anxiety disorders: Secondary | ICD-10-CM

## 2020-11-08 DIAGNOSIS — E1142 Type 2 diabetes mellitus with diabetic polyneuropathy: Secondary | ICD-10-CM

## 2020-11-08 DIAGNOSIS — E785 Hyperlipidemia, unspecified: Secondary | ICD-10-CM

## 2020-11-08 DIAGNOSIS — E559 Vitamin D deficiency, unspecified: Secondary | ICD-10-CM

## 2020-11-08 DIAGNOSIS — Z6841 Body Mass Index (BMI) 40.0 and over, adult: Secondary | ICD-10-CM | POA: Diagnosis not present

## 2020-11-08 MED ORDER — HYDROCHLOROTHIAZIDE 25 MG PO TABS: 1.0000 | ORAL_TABLET | Freq: Every day | ORAL | 3 refills | Status: DC

## 2020-11-08 MED ORDER — LEVOTHYROXINE SODIUM 88 MCG PO TABS: 88.0000 ug | ORAL_TABLET | Freq: Every day | ORAL | 3 refills | Status: DC

## 2020-11-08 MED ORDER — ALBUTEROL SULFATE HFA 108 (90 BASE) MCG/ACT IN AERS: 2.0000 | INHALATION_SPRAY | RESPIRATORY_TRACT | 3 refills | Status: DC | PRN

## 2020-11-08 MED ORDER — POTASSIUM CHLORIDE CRYS ER 10 MEQ PO TBCR: 10.0000 meq | EXTENDED_RELEASE_TABLET | Freq: Every day | ORAL | 3 refills | Status: DC

## 2020-11-08 MED ORDER — ROSUVASTATIN CALCIUM 5 MG PO TABS: 5.0000 mg | ORAL_TABLET | Freq: Every day | ORAL | 3 refills | Status: DC

## 2020-11-08 MED ORDER — PANTOPRAZOLE SODIUM 40 MG PO TBEC: 40.0000 mg | DELAYED_RELEASE_TABLET | Freq: Two times a day (BID) | ORAL | 3 refills | Status: DC

## 2020-11-08 NOTE — Patient Instructions (Addendum)
Get your flu shot in the fall   If you change your mind and have questions about the covid vaccine let us know   Get in with your therapist when you can  Continue follow up with psychiatry   Spray with insect repellent when you work outside   If you are interested in the shingles vaccine series (Shingrix), call your insurance or pharmacy to check on coverage and location it must be given.  If affordable - you can schedule it here or at your pharmacy depending on coverage   I placed referral for eye exam and colonoscopy  You will get a call   Keep taking vitamin D 5000 iu daily -level is mildly low   Go up on your levothyroxine to 88 mcg  Schedule lab appt for 6 weeks   Start crestor 5 mg daily in the evening for cholesterol  We will check cholesterol at the 6 week labs also
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
## 2020-11-10 NOTE — Assessment & Plan Note (Signed)
Disc goals for lipids and reasons to control them Rev last labs with pt Rev low sat fat diet in detail LDL is 91 Pt is agreeable to start crestor 5 mg daily  Disc poss side eff Lab planned 6 wk

## 2020-11-10 NOTE — Assessment & Plan Note (Signed)
Takes protonix and unable to come off of it  Vit B12 level is normal Vit D level is low -disc supplementation

## 2020-11-10 NOTE — Assessment & Plan Note (Signed)
Level of 26 She is taking D currently  Multiple recent fractures-would consider dexa

## 2020-11-10 NOTE — Assessment & Plan Note (Signed)
Lab Results  Component Value Date   HGBA1C 8.2 (A) 10/21/2020   She will continue f/u with endocrinologist  Ref done for oph exam

## 2020-11-10 NOTE — Assessment & Plan Note (Signed)
Lab Results  Component Value Date   VITAMINB12 425 11/06/2020   D is low at 26- disc supplementation

## 2020-11-10 NOTE — Assessment & Plan Note (Signed)
Referral done for screening colonoscopy  

## 2020-11-10 NOTE — Assessment & Plan Note (Signed)
Reviewed health habits including diet and exercise and skin cancer prevention Reviewed appropriate screening tests for age  Also reviewed health mt list, fam hx and immunization status , as well as social and family history   See HPI Labs reviewed  Added statin and inc her levothyroxine Declines shingrix and covid vaccination  Enc yearly flu shot  Ref for eye exam  Ref for colonoscopy  Mammogram is utd

## 2020-11-10 NOTE — Assessment & Plan Note (Signed)
Discussed how this problem influences overall health and the risks it imposes  Reviewed plan for weight loss with lower calorie diet (via better food choices and also portion control or program like weight watchers) and exercise building up to or more than 30 minutes 5 days per week including some aerobic activity    

## 2020-11-10 NOTE — Assessment & Plan Note (Signed)
bp in fair control at this time  BP Readings from Last 1 Encounters:  11/08/20 126/66   No changes needed Most recent labs reviewed  Disc lifstyle change with low sodium diet and exercise  Plan to continue hctz 25 mg daily

## 2020-11-10 NOTE — Assessment & Plan Note (Signed)
Lab Results  Component Value Date   TSH 5.46 (H) 11/06/2020   Some fatigue Levothyroxine inc to 88 mcg daily  Re check in 6 wk
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
## 2020-11-11 ENCOUNTER — Ambulatory Visit: Payer: PRIVATE HEALTH INSURANCE | Admitting: Podiatry

## 2020-11-20 DIAGNOSIS — G894 Chronic pain syndrome: Secondary | ICD-10-CM | POA: Diagnosis not present

## 2020-11-21 ENCOUNTER — Telehealth: Payer: Self-pay | Admitting: *Deleted

## 2020-11-21 MED ORDER — PANTOPRAZOLE SODIUM 40 MG PO TBEC: 40.0000 mg | DELAYED_RELEASE_TABLET | Freq: Two times a day (BID) | ORAL | 3 refills | Status: DC

## 2020-11-21 MED ORDER — PANTOPRAZOLE SODIUM 40 MG PO TBEC: 40.0000 mg | DELAYED_RELEASE_TABLET | Freq: Every day | ORAL | 3 refills | Status: DC

## 2020-11-21 NOTE — Telephone Encounter (Signed)
I sent it  Let me know if symptoms worsen or change with once daily dosing  It is a good idea to try this

## 2020-11-21 NOTE — Telephone Encounter (Signed)
Received fax from [facility] saying that pt's insurance only covers once daily dosing of Protonix and PCP needs to send in new Rx with once daily dosing (#90) or if not appropriate then send in alt med, please advise

## 2020-11-21 NOTE — Telephone Encounter (Signed)
Pt notified of change in med and Rx was sent.  **pt did have a question for Dr. Glori Bickers:  Pt said that she read all of the possible side eff of taking Crestor and decided not to take it, she never took med and never had any side eff but she is afraid that if she does take med she will get side eff. Pt wanted to know if there was a cholesterol med she can take that wouldn't cause these side eff that she is reading about. Please advise

## 2020-11-21 NOTE — Telephone Encounter (Signed)
All medicines have possible side effects. (The only way to find out if you have a side effect is to try it)  If she declines crestor we can try zetia 10 mg 1 po qd #90 3 ref   re check lipid in 6 weeks  Zetia does not work for everyone- but if no improvement I would just stop it.

## 2020-11-22 NOTE — Telephone Encounter (Signed)
Left VM requesting pt to call the office back 

## 2020-11-26 NOTE — Telephone Encounter (Signed)
Pt will try the crestor, if she has any side eff she will stop med and let us know. Pt already has f/u labs scheduled so will keep that appt to recheck labs then.

## 2020-11-28 ENCOUNTER — Telehealth: Payer: Self-pay

## 2020-11-28 DIAGNOSIS — R918 Other nonspecific abnormal finding of lung field: Secondary | ICD-10-CM | POA: Diagnosis not present

## 2020-11-28 DIAGNOSIS — S299XXA Unspecified injury of thorax, initial encounter: Secondary | ICD-10-CM | POA: Diagnosis not present

## 2020-11-28 DIAGNOSIS — Z6841 Body Mass Index (BMI) 40.0 and over, adult: Secondary | ICD-10-CM | POA: Diagnosis not present

## 2020-11-28 DIAGNOSIS — R0781 Pleurodynia: Secondary | ICD-10-CM | POA: Diagnosis not present

## 2020-11-28 NOTE — Telephone Encounter (Signed)
See note below access nurse note.   [place] [facility] - Client TELEPHONE ADVICE RECORD AccessNurse Patient Name: Alexa Woods OR TIZ Gender: Female DOB: Apr 02, 1961 Age: 60 Y 41 M 13 D Return Phone Number: [phone] (Primary) Address: [address] City/ State/ Zip: [place] [address] Client Alexa Woods - Client Client Site [place] MD Contact Type Call Who Is Calling Patient / Member / Family / Caregiver Call Type Triage / Clinical Relationship To Patient Self Return Phone Number [phone] (Primary) Chief Complaint BREATHING - shortness of breath or sounds breathless Reason for Call Symptomatic / Request for [place] states she fell and thinks she broke her rib. Caller states she is having trouble breathing. Translation No Nurse Assessment Nurse: Doyle Askew, RN, Beth Date/Time (Eastern Time): 11/28/2020 2:21:20 PM Confirm and document reason for call. If symptomatic, describe symptoms. ---Caller states she fell about an hr ago (knocked down by dog) and thinks she broke her rib, left side. Caller states she is having trouble breathing. Does the patient have any new or worsening symptoms? ---Yes Will a triage be completed? ---Yes Related visit to physician within the last 2 weeks? ---No Does the PT have any chronic conditions? (i.e. diabetes, asthma, this includes High risk factors for pregnancy, etc.) ---Yes List chronic conditions. ---diabetic, asthma, thyroid dz, depression Is this a behavioral health or substance abuse call? ---No Guidelines Guideline Title Affirmed Question Affirmed Notes Nurse Date/Time (Eastern Time) Falls and Falling [1] SEVERE weakness (i.e., unable to walk or barely able to walk, requires support) AND [2] new-onset or worsening Sunday Corn, Beth 11/28/2020 2:23:05 PM PLEASE NOTE: All  timestamps contained within this report are represented as [place] Standard Time. CONFIDENTIALTY NOTICE: This fax transmission is intended only for the addressee. It contains information that is legally privileged, confidential or otherwise protected from use or disclosure. If you are not the intended recipient, you are strictly prohibited from reviewing, disclosing, copying using or disseminating any of this information or taking any action in reliance on or regarding this information. If you have received this fax in error, please notify us immediately by telephone so that we can arrange for its return to [place]. Phone: [phone], Toll-Free: [phone], Fax: [phone] Page: 2 of 2 Call Id: [id number] [place]. Time [name] Time) Disposition Final User 11/28/2020 2:19:50 PM Send to Urgent Queue Elveria Royals 11/28/2020 2:42:38 PM 911 Outcome Documentation Doyle Askew, RN, Eustaquio Maize Reason: Caller refused to call EMS at this time - did notify office who will be reaching out per instructions 11/28/2020 2:41:36 PM Call EMS 911 Now Yes Doyle Askew, RN, Eustaquio Maize Caller Disagree/Comply Disagree Caller Understands Yes PreDisposition Call Doctor Care Advice Given Per Guideline CALL EMS 911 NOW: * Immediate medical attention is needed. You need to hang up and call 911 (or an ambulance). CARE ADVICE given per Fall and Falling (Adult) guideline. Comments User: Melene Muller, RN Date/Time [name] Time): 11/28/2020 2:41:32 PM Did speak with nurse at office about caller refusing EMS or ED treatment...was told nurse would be reaching out to pt shortly Referrals REFERRED TO PCP OFFICE

## 2020-11-28 NOTE — Telephone Encounter (Signed)
Agree with that advisement and I will watch for correspondence.   Please check in with her tomorrow

## 2020-11-28 NOTE — Telephone Encounter (Signed)
[facility] front office mgr spoke with access nurse and pt was advised by access nurse since pt fell and heard something pop and pt is having difficulty breathing pt was advised by access nurse to go to Ed for eval and pt refused ED. Pt is not waiting on phone. I spoke with pt; pt fell on 11/28/20 about 1:00 pm in pts back yard. Pt heard something pop in lt side of ribs. Pt is hurting in lt side of chest and difficulty breathing but pt will not go to ED. Pt said she will go to [facility] this afternoon when her husband gets home from having car inspected. Adv ised pt is she needs to be seen before husband gets home to call 911. Sending note to Dr Glori Bickers as Juluis Rainier.

## 2020-12-04 ENCOUNTER — Ambulatory Visit: Payer: PRIVATE HEALTH INSURANCE | Admitting: Primary Care

## 2020-12-06 ENCOUNTER — Encounter: Payer: Self-pay | Admitting: Family Medicine

## 2020-12-06 ENCOUNTER — Ambulatory Visit (INDEPENDENT_AMBULATORY_CARE_PROVIDER_SITE_OTHER)
Admission: RE | Admit: 2020-12-06 | Discharge: 2020-12-06 | Disposition: A | Discharge status: Home / Self Care | Payer: PRIVATE HEALTH INSURANCE | Source: Ambulatory Visit | Attending: Family Medicine | Admitting: Family Medicine

## 2020-12-06 ENCOUNTER — Ambulatory Visit (INDEPENDENT_AMBULATORY_CARE_PROVIDER_SITE_OTHER): Payer: PRIVATE HEALTH INSURANCE | Admitting: Family Medicine

## 2020-12-06 ENCOUNTER — Other Ambulatory Visit: Payer: Self-pay

## 2020-12-06 VITALS — BP 128/72 | HR 75 | Temp 96.9°F | Ht 65.25 in | Wt 259.1 lb

## 2020-12-06 DIAGNOSIS — R0789 Other chest pain: Secondary | ICD-10-CM | POA: Diagnosis not present

## 2020-12-06 DIAGNOSIS — S30860A Insect bite (nonvenomous) of lower back and pelvis, initial encounter: Secondary | ICD-10-CM | POA: Diagnosis not present

## 2020-12-06 DIAGNOSIS — W57XXXA Bitten or stung by nonvenomous insect and other nonvenomous arthropods, initial encounter: Secondary | ICD-10-CM | POA: Diagnosis not present

## 2020-12-06 DIAGNOSIS — R0781 Pleurodynia: Secondary | ICD-10-CM | POA: Diagnosis not present

## 2020-12-06 NOTE — Progress Notes (Signed)
Subjective:    Patient ID: Alexa Woods, female    DOB: Feb 13, 1961, 60 y.o.   MRN: [id number]  This visit occurred during the SARS-CoV-2 public health emergency.  Safety protocols were in place, including screening questions prior to the visit, additional usage of staff PPE, and extensive cleaning of exam room while observing appropriate contact time as indicated for disinfecting solutions.   HPI Pt presents with pain after a fall   Wt Readings from Last 3 Encounters:  12/06/20 259 lb 1 oz (117.5 kg)  11/08/20 257 lb 1 oz (116.6 kg)  10/21/20 258 lb 12.8 oz (117.4 kg)   42.78 kg/m   She was seen at [facility] clinic on 6/30  Noted dog knocked her down after she tried to separate 2 dogs  She fell and hand got caught between her L chest wall and the ground  Of note past hx of R rib fx earlier this year  Chest wall tenderness was noted on exam  A/P per note: Assessment and Plan:   1. Rib pain on left side (primary encounter diagnosis) Rib injury: Acute. Plain films negative for definite acute bony injury (radiology read pending). May continue oxycodone/acetaminophen for pain. Advise regarding icing.. Handout given. Red flags reviewed.   She takes oxycodone for a variety of chronic pain/arthritis issues   Pain was pretty bad  Hurts to breathe (but not sob)  Miserable  No cough  Not bruised  Very tender  Lateral chest wall-way to the side   Patient Active Problem List   Diagnosis Date Noted   Left-sided chest wall pain 12/06/2020   Colon cancer screening 11/08/2020   Current use of proton pump inhibitor 11/04/2020   Right foot injury 07/24/2020   Left ear pain 07/04/2020   Rib fractures 06/06/2020   Sprain of right wrist 04/10/2020   Trigger finger of right hand 12/20/2019   Hydrocephalus ([place]) 09/05/2019   Dry eyes 09/05/2019   Intractable headache 08/25/2019   Intractable nausea and vomiting 08/25/2019   Hypothyroidism 04/04/2019   Screening mammogram,  encounter for 04/03/2019   Routine general medical examination at a health care facility 04/03/2019   Encounter for screening for HIV 04/03/2019   Encounter for hepatitis C screening test for low risk patient 04/03/2019   Visit for routine gyn exam 04/03/2019   Vitamin D deficiency 04/03/2019   Proximal humerus fracture 02/21/2019   Intertrigo 02/21/2019   History of left knee replacement 12/26/2017   Primary osteoarthritis involving multiple joints 12/13/2017   Primary osteoarthritis of left knee 12/06/2017   Chronic neck pain 08/13/2017   Pre-operative exam 04/05/2017   Morbid obesity ([place]) 01/21/2017   Joint swelling 12/20/2013   Tick bite of back 02/03/2013   Periodontal disease 02/03/2013   Hyperlipidemia associated with type 2 diabetes mellitus ([place]) 09/27/2012   Low back pain 07/22/2010   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   Type 2 diabetes mellitus with peripheral neuropathy ([place]) 11/30/2006   History of alcohol abuse 11/30/2006   Depression with anxiety 11/30/2006   Essential hypertension 11/30/2006   HEMORRHOIDS 11/30/2006   Allergic rhinitis 11/30/2006   Asthma 11/30/2006   GERD without esophagitis 11/30/2006   Psoriasis 11/30/2006   Past Medical History:  Diagnosis Date   Alcohol abuse, in remission    since 1998   Allergic rhinitis    Anxiety    Asthma    Bilateral lower extremity edema    Bulging lumbar disc    Bulging of cervical intervertebral disc  Chronic constipation    DDD (degenerative disc disease), lumbosacral    Depression    Dyspnea    GERD (gastroesophageal reflux disease)    Hemorrhoids    History of Bell's palsy 08/2007   left   History of chronic cystitis    IBS (irritable bowel syndrome)    Insulin dependent type 2 diabetes mellitus [facility])    endocrinologist-- dr Cruzita Lederer   Lower urinary tract symptoms (LUTS)    OA (osteoarthritis)    knees, back, hands, elbows   OSA (obstructive sleep apnea)    per study 06-08-2017 mild osa ,  cpap recommended , per pt insurance issue   Peripheral neuropathy    PONV (postoperative nausea and vomiting)    Psoriasis    S/P dilatation of esophageal stricture    Unspecified essential hypertension    Wears partial dentures    lower   Past Surgical History:  Procedure Laterality Date   CHOLECYSTECTOMY OPEN  1990   AND APPENDECTOMY   DOBUTAMINE STRESS ECHO  2009    normal stress echo, no evidence of ischemia   ELBOW SURGERY Bilateral RIGHT 2013;  LEFT 2016   for nerve damage   ESOPHAGOGASTRODUODENOSCOPY  07/2002   erythematous gastropathy   KNEE ARTHROSCOPY Left 11/ 2018   dr Wynelle Link  @ [facility]   KNEE ARTHROSCOPY W/ PARTIAL MEDIAL MENISCECTOMY Right 10/2008   and chondroplasty   SHOULDER ARTHROSCOPY WITH DISTAL CLAVICLE RESECTION Left 12/ 2011   dr dean   TOTAL KNEE ARTHROPLASTY Right 07-01-2009  dr Wynelle Link   [facility]   TOTAL KNEE ARTHROPLASTY Left 12/06/2017   Procedure: LEFT TOTAL LEFT KNEE ARTHROPLASTY;  Surgeon: Gaynelle Arabian, MD;  Location: [facility];  Service: Orthopedics;  Laterality: Left;   VAGINAL HYSTERECTOMY  2003   Social History   Tobacco Use   Smoking status: Former    Years: 25.00    Pack years: 0.00    Types: Cigarettes    Quit date: 12/30/2001    Years since quitting: 18.9   Smokeless tobacco: Never  Vaping Use   Vaping Use: Never used  Substance Use Topics   Alcohol use: No    Alcohol/week: 0.0 standard drinks    Comment: hx alcoholism--  stopped 1998    Drug use: No   Family History  Problem Relation Age of Onset   Alcohol abuse Mother    Hypertension Mother    Esophageal cancer Maternal Grandmother    Lung cancer Other        mat great uncle   Colon cancer Neg Hx    Allergies  Allergen Reactions   Bupropion Other (See Comments)    Pt is unsure   Bupropion Hcl Other (See Comments)    Pt is unsure   Cefuroxime Axetil Nausea Only   Gabapentin Other (See Comments)    Pt does not remember reaction  Pt does not remember reaction    Lidocaine Other  (See Comments)    REACTION: unknown REACTION: unknown   Metformin Other (See Comments)    REACTION: GI REACTION: GI   Other Other (See Comments)   Paroxetine Other (See Comments)    REACTION: doesn't agree REACTION: doesn't agree   Propoxyphene Other (See Comments)    wheezing wheezing   Tramadol Other (See Comments)    REACTION: Causes Anxiety   Tramadol Hcl     REACTION: Causes Anxiety   Codeine Nausea And Vomiting and Rash   Sulfa Antibiotics Rash and Other (See Comments)   Current  Outpatient Medications on File Prior to Visit  Medication Sig Dispense Refill   albuterol (VENTOLIN HFA) 108 (90 Base) MCG/ACT inhaler Inhale 2 puffs into the lungs every 4 (four) hours as needed for wheezing or shortness of breath. 18 each 3   BD INSULIN SYRINGE U/F 31G X 5/16" 0.5 ML MISC USE THREE TIMES PER DAY WITH R INSULIN & ONCE DAILY WITH LEVEMIR 300 each 3   cholecalciferol (VITAMIN D3) 25 MCG (1000 UNIT) tablet Take 5,000 Units by mouth daily.     docusate sodium (COLACE) 100 MG capsule Take 100 mg by mouth every morning.      fluticasone (FLONASE) 50 MCG/ACT nasal spray Place 2 sprays into both nostrils daily as needed for allergies or rhinitis. 16 g 5   hydrochlorothiazide (HYDRODIURIL) 25 MG tablet Take 1 tablet (25 mg total) by mouth daily. 90 tablet 3   insulin glargine (LANTUS) 100 UNIT/ML injection INJECT 0.30-0.35 MLS (30-35 UNITS TOTAL) INTO THE SKIN DAILY. 30 mL 3   insulin regular (HUMULIN R) 100 units/mL injection INJECT 0.14-0.24ML'S (14 TO 24 UNITS TOTAL) INTO THE SKIN THREE TIMES A DAY BEFORE MEALS. 20 mL 9   Lancets (ONETOUCH ULTRASOFT) lancets Use to test blood sugar 4 times daily as instructed. 200 each 11   levothyroxine (SYNTHROID) 88 MCG tablet Take 1 tablet (88 mcg total) by mouth daily. 90 tablet 3   LORazepam (ATIVAN) 1 MG tablet Take 2 mg by mouth 2 (two) times daily as needed for anxiety or sleep.     naloxone (NARCAN) 4 MG/0.1ML LIQD nasal spray kit Narcan 4  mg/actuation nasal spray     ONETOUCH ULTRA test strip USE TO TEST BLOOD SUGAR 4 TIMES DAILY AS INSTRUCTED. 400 strip 0   oxyCODONE-acetaminophen (PERCOCET) 10-325 MG tablet Take 1 tablet by mouth every 4 (four) hours as needed for pain. Hold for SBP < = 105 12 tablet 0   pantoprazole (PROTONIX) 40 MG tablet Take 1 tablet (40 mg total) by mouth daily. 90 tablet 3   polyethylene glycol (MIRALAX / GLYCOLAX) packet Take 17 g by mouth daily as needed for mild constipation or moderate constipation.      potassium chloride (KLOR-CON M10) 10 MEQ tablet Take 1 tablet (10 mEq total) by mouth daily. 90 tablet 3   rosuvastatin (CRESTOR) 5 MG tablet Take 1 tablet (5 mg total) by mouth daily. 90 tablet 3   senna (SENOKOT) 8.6 MG TABS tablet Take 1 tablet by mouth daily.      sertraline (ZOLOFT) 100 MG tablet Take 200 mg by mouth every morning.     tiZANidine (ZANAFLEX) 4 MG tablet Take 4 mg by mouth 2 (two) times daily as needed for muscle spasms.      triamcinolone cream (KENALOG) 0.1 % Apply 1 application topically 2 (two) times daily. To psoriasis areas 30 g 0   zolpidem (AMBIEN) 10 MG tablet Take 10 mg by mouth at bedtime as needed for sleep.     No current facility-administered medications on file prior to visit.    Review of Systems  Constitutional:  Negative for activity change, appetite change, fatigue, fever and unexpected weight change.  HENT:  Negative for congestion, ear pain, rhinorrhea, sinus pressure and sore throat.   Eyes:  Negative for pain, redness and visual disturbance.  Respiratory:  Negative for cough, shortness of breath and wheezing.   Cardiovascular:  Negative for chest pain.  Gastrointestinal:  Negative for abdominal pain, blood in stool, constipation and diarrhea.  Endocrine:  Negative for polydipsia and polyuria.  Genitourinary:  Negative for dysuria, frequency and urgency.  Musculoskeletal:  Positive for arthralgias. Negative for back pain and myalgias.       L sided rib pain   Skin:  Negative for pallor and rash.       Pt gets frequent tick bites with itching  No rash   Allergic/Immunologic: Positive for food allergies. Negative for environmental allergies.  Neurological:  Negative for dizziness, syncope and headaches.  Hematological:  Negative for adenopathy. Does not bruise/bleed easily.  Psychiatric/Behavioral:  Negative for decreased concentration and dysphoric mood. The patient is not nervous/anxious.       Objective:   Physical Exam Constitutional:      General: She is not in acute distress.    Appearance: Normal appearance. She is well-developed. She is obese. She is not ill-appearing or diaphoretic.  HENT:     Head: Normocephalic and atraumatic.  Eyes:     Conjunctiva/sclera: Conjunctivae normal.     Pupils: Pupils are equal, round, and reactive to light.  Neck:     Thyroid: No thyromegaly.     Vascular: No carotid bruit or JVD.  Cardiovascular:     Rate and Rhythm: Normal rate and regular rhythm.     Heart sounds: Normal heart sounds.    No gallop.  Pulmonary:     Effort: Pulmonary effort is normal. No respiratory distress.     Breath sounds: Normal breath sounds. No wheezing or rales.     Comments: L lateral chest wall is tender  No crepitus or step off noted No skin change  Nl air exchange   Chest:     Chest wall: Tenderness present.  Abdominal:     General: Bowel sounds are normal. There is no distension or abdominal bruit.     Palpations: Abdomen is soft. There is no mass.     Tenderness: There is no abdominal tenderness.  Musculoskeletal:     Cervical back: Normal range of motion and neck supple.     Right lower leg: No edema.     Left lower leg: No edema.  Lymphadenopathy:     Cervical: No cervical adenopathy.  Skin:    General: Skin is warm and dry.     Coloration: Skin is not pale.     Findings: No rash.     Comments: Very small un engorged tick noted on L mid back /barely attached  After consent obt.  Area cleaned with  alcohol and tick removed easily without tweezers  No erythema or swelling noted     Neurological:     Mental Status: She is alert.     Coordination: Coordination normal.     Deep Tendon Reflexes: Reflexes are normal and symmetric. Reflexes normal.  Psychiatric:        Mood and Affect: Mood is depressed.        Cognition and Memory: Cognition normal.          Assessment & Plan:   Problem List Items Addressed This Visit       Musculoskeletal and Integument   Tick bite of back    Small tick on L mid back removed today - partially attached and not engorged No skin changes (not red or swollen) inst pt to keep area clean with soap and water and watch for changes in skin or rash/fever or other symptoms          Other   Left-sided chest wall pain - Primary  After fall on chest wall  Rev notes from [facility] with reassuring xray report  Pt req another xray due to degree of discomfort Reassuring exam  Disc symptom care  Disc avoidance of atelectasis with deep breaths supported by pillow  Disc s/s of pna to watch for  Xray ordered-rad rev pending        Relevant Orders   DG Ribs Unilateral Left (Completed)

## 2020-12-06 NOTE — Patient Instructions (Signed)
Keep the tick bite on back clean with soap and water  Watch for rash or redness that is new  Fever  Antibiotic ointment is fine  Try and take a deep breath or cough every 30 minutes while awake (grab a pillow)  Heat or ice or both is fine   Take your pain medicine   Xray today  We will contact you with results

## 2020-12-07 NOTE — Assessment & Plan Note (Signed)
Small tick on L mid back removed today - partially attached and not engorged No skin changes (not red or swollen) inst pt to keep area clean with soap and water and watch for changes in skin or rash/fever or other symptoms

## 2020-12-07 NOTE — Assessment & Plan Note (Signed)
After fall on chest wall  Rev notes from [facility] with reassuring xray report  Pt req another xray due to degree of discomfort Reassuring exam  Disc symptom care  Disc avoidance of atelectasis with deep breaths supported by pillow  Disc s/s of pna to watch for  Xray ordered-rad rev pending

## 2020-12-09 ENCOUNTER — Telehealth: Payer: Self-pay

## 2020-12-09 NOTE — Progress Notes (Signed)
I cannot go up on percocet or add narcotic medication  If she is not already on nsaid please send in meloxicam 15 mg 1 po qd prn with food #30 no refills  I hope that helps

## 2020-12-09 NOTE — Telephone Encounter (Signed)
New message    Returning call from Friday

## 2020-12-09 NOTE — Telephone Encounter (Signed)
Addressed through result notes  

## 2020-12-10 ENCOUNTER — Other Ambulatory Visit: Payer: Self-pay | Admitting: Family Medicine

## 2020-12-10 NOTE — Telephone Encounter (Signed)
If not already taking an nsaid could try meloxicam 15 mg daily with food as needed  I will pend to pharmacy of choice  I cannot give her more narcotic   Keep me posted

## 2020-12-11 NOTE — Telephone Encounter (Signed)
Left VM requesting pt to call the office back 

## 2020-12-12 MED ORDER — MELOXICAM 15 MG PO TABS: 15.0000 mg | ORAL_TABLET | Freq: Every day | ORAL | 0 refills | Status: DC | PRN

## 2020-12-12 NOTE — Telephone Encounter (Signed)
Pt notified of Dr. Tower's instructions and recommendations and verbalized understanding. Rx sent to pharmacy  

## 2020-12-17 ENCOUNTER — Other Ambulatory Visit: Payer: Self-pay | Admitting: Family Medicine

## 2020-12-18 ENCOUNTER — Telehealth: Payer: Self-pay | Admitting: Family Medicine

## 2020-12-18 ENCOUNTER — Other Ambulatory Visit: Payer: Self-pay | Admitting: Family Medicine

## 2020-12-18 DIAGNOSIS — E039 Hypothyroidism, unspecified: Secondary | ICD-10-CM

## 2020-12-18 DIAGNOSIS — E1169 Type 2 diabetes mellitus with other specified complication: Secondary | ICD-10-CM

## 2020-12-18 NOTE — Telephone Encounter (Signed)
-----   Message from Ellamae Sia sent at 12/06/2020 10:13 AM EDT ----- Regarding: Lab orders for Thursday, 7.21.22 6 week labs, thanks

## 2020-12-19 ENCOUNTER — Other Ambulatory Visit (INDEPENDENT_AMBULATORY_CARE_PROVIDER_SITE_OTHER): Payer: PRIVATE HEALTH INSURANCE

## 2020-12-19 ENCOUNTER — Other Ambulatory Visit: Payer: Self-pay

## 2020-12-19 ENCOUNTER — Ambulatory Visit: Payer: PRIVATE HEALTH INSURANCE | Admitting: Family Medicine

## 2020-12-19 DIAGNOSIS — E785 Hyperlipidemia, unspecified: Secondary | ICD-10-CM | POA: Diagnosis not present

## 2020-12-19 DIAGNOSIS — E039 Hypothyroidism, unspecified: Secondary | ICD-10-CM

## 2020-12-19 DIAGNOSIS — E1169 Type 2 diabetes mellitus with other specified complication: Secondary | ICD-10-CM

## 2020-12-19 LAB — LIPID PANEL
Cholesterol: 135 mg/dL (ref 0–200)
HDL: 74.2 mg/dL (ref >39.00)
LDL Cholesterol: 44 mg/dL (ref 0–99)
NonHDL: 60.44
Total CHOL/HDL Ratio: 2
Triglycerides: 81 mg/dL (ref 0.0–149.0)
VLDL: 16.2 mg/dL (ref 0.0–40.0)

## 2020-12-19 LAB — ALT: ALT: 16 U/L (ref 0–35)

## 2020-12-19 LAB — AST: AST: 19 U/L (ref 0–37)

## 2020-12-19 LAB — TSH: TSH: 3.97 u[IU]/mL (ref 0.35–5.50)

## 2020-12-20 ENCOUNTER — Encounter: Payer: Self-pay | Admitting: *Deleted

## 2021-01-16 ENCOUNTER — Other Ambulatory Visit: Payer: Self-pay | Admitting: Internal Medicine

## 2021-01-21 ENCOUNTER — Telehealth: Payer: Self-pay

## 2021-01-21 NOTE — Telephone Encounter (Signed)
Pt left v/m requesting cb about stomach problems; left v/m requesting cb for more info. Sending note to triage.

## 2021-01-21 NOTE — Telephone Encounter (Signed)
I spoke with pt and she is having lower mid abd pain over navel that is sharp on and off; pt had abd pain about 10' ago., ok scheduled appt in office with Dr Glori Bickers on 01/22/21 at 12 noon. Pt also having watery diarrhea after pt eats. Appt oked by Dr Glori Bickers to be in office. UC & ED precautions given and pt voiced understanding. If pt condition changes and develops possible covid symptoms that I reviewed on this call pt will call in AM to see what she should do.Sending note to DR Glori Bickers and [place] CMA.

## 2021-01-21 NOTE — Telephone Encounter (Signed)
Aware- agree with that adv and will see her then

## 2021-01-21 NOTE — Telephone Encounter (Signed)
[facility] - Client TELEPHONE ADVICE RECORD AccessNurse Patient Name: Hayat OR TIZ Gender: Female DOB: 1960-08-26 Age: 60 Y 10 M 6 D Return Phone Number: [phone] (Primary) Address: [address] City/ State/ Zip: [place] [address] Client [facility] - Client Client Site [place] MD Contact Type Call Who Is Calling Patient / Member / Family / Caregiver Call Type Triage / Clinical Relationship To Patient Self Return Phone Number [phone] (Primary) Chief Complaint Abdominal Pain Reason for Call Symptomatic / Request for Health Information Initial Comment Pt is having sharp pains in stomach for a week and 3 days with watery diarrhea. Translation No Nurse Assessment Nurse: Eugenio Hoes, RN, Jenny Reichmann Date/Time (Eastern Time): 01/21/2021 3:22:42 PM Confirm and document reason for call. If symptomatic, describe symptoms. ---Caller states that she is having intermittent sharp pains in stomach for a week especially when she eats and 3 days with watery diarrhea. Denies fever today. 6/10 Does the patient have any new or worsening symptoms? ---Yes Will a triage be completed? ---Yes Related visit to physician within the last 2 weeks? ---No Does the PT have any chronic conditions? (i.e. diabetes, asthma, this includes High risk factors for pregnancy, etc.) ---Yes List chronic conditions. ---DM, Gastroparesis, Asthma, thyroid disease, cholesterol, depression, chronic pain. Is this a behavioral health or substance abuse call? ---No Guidelines Guideline Title Affirmed Question Affirmed Notes Nurse Date/Time (Eastern Time) Diarrhea [1] SEVERE diarrhea (e.g., 7 or more times / day more than normal) AND [2] present > 24 hours (1 day) Eugenio Hoes, RN, [facility] : [facility] 01/21/2021 3:25:42 PM Disp. Time [name] Time) Disposition Final User PLEASE NOTE: All timestamps  contained within this report are represented as [place] Standard Time. CONFIDENTIALTY NOTICE: This fax transmission is intended only for the addressee. It contains information that is legally privileged, confidential or otherwise protected from use or disclosure. If you are not the intended recipient, you are strictly prohibited from reviewing, disclosing, copying using or disseminating any of this information or taking any action in reliance on or regarding this information. If you have received this fax in error, please notify us immediately by telephone so that we can arrange for its return to [place]. Phone: [phone], Toll-Free: [phone], Fax: [phone] Page: 2 of 2 Call Id: [id number] 01/21/2021 3:33:29 PM See PCP within 24 Hours Yes Eugenio Hoes, RN, [place] Disagree/Comply Comply Caller Understands Yes PreDisposition Did not know what to do Care Advice Given Per Guideline SEE PCP WITHIN 24 HOURS: * IF OFFICE WILL BE OPEN: You need to be examined within the next 24 hours. Call your doctor (or NP/PA) when the office opens and make an appointment. FLUID THERAPY DURING SEVERE DIARRHEA: * Drink more fluids, at least 8 to 10 cups daily. One cup equals 8 oz (240 ml). * SPORTS DRINKS: You can also drink half-strength sports drinks (e.g., Gatorade, Powerade) to help treat and prevent dehydration. Mix the sports drink half and half with water. * AVOID caffeinated beverages. Reason: Caffeine is mildly dehydrating. * AVOID alcohol beverages (e.g., beer, wine, hard liquor). * AVOID carbonated soft drinks (soda) as these can make your diarrhea worse. DIARRHEA MEDICINE - BISMUTH SUBSALICYLATE (E.G., KAOPECTATE, PEPTO-BISMOL): * This medicine can help reduce diarrhea, vomiting, and abdominal cramping. It is available over-the-counter (OTC) in a drugstore. * Adult dosage: Take two tablets or two tablespoons by mouth every hour (if diarrhea continues) to a maximum of 8 doses in a 24  hour period. * Do not  use for more than 2 days. DIARRHEA MEDICINE - BISMUTH SUBSALICYLATE - EXTRA NOTES AND WARNINGS: CONTAGIOUSNESS: * Wash your hands after using the bathroom. CALL BACK IF: * Signs of dehydration occur (e.g., no urine over 12 hours, very dry mouth, lightheaded, etc.) * Bloody stools * Constant or severe abdomen pain * You become worse CARE ADVICE given per Diarrhea (Adult) guideline. Referrals REFERRED TO PCP OFFICE

## 2021-01-22 ENCOUNTER — Other Ambulatory Visit: Payer: Self-pay

## 2021-01-22 ENCOUNTER — Ambulatory Visit (INDEPENDENT_AMBULATORY_CARE_PROVIDER_SITE_OTHER): Payer: PRIVATE HEALTH INSURANCE | Admitting: Family Medicine

## 2021-01-22 ENCOUNTER — Ambulatory Visit
Admission: RE | Admit: 2021-01-22 | Discharge: 2021-01-22 | Disposition: A | Discharge status: Home / Self Care | Payer: PRIVATE HEALTH INSURANCE | Source: Ambulatory Visit | Attending: Family Medicine | Admitting: Family Medicine

## 2021-01-22 ENCOUNTER — Encounter: Payer: Self-pay | Admitting: Family Medicine

## 2021-01-22 ENCOUNTER — Ambulatory Visit
Admission: RE | Admit: 2021-01-22 | Discharge: 2021-01-22 | Disposition: A | Discharge status: Home / Self Care | Payer: PRIVATE HEALTH INSURANCE | Attending: Family Medicine | Admitting: Family Medicine

## 2021-01-22 VITALS — BP 128/64 | HR 68 | Temp 96.2°F | Ht 65.25 in | Wt 249.2 lb

## 2021-01-22 DIAGNOSIS — R8290 Unspecified abnormal findings in urine: Secondary | ICD-10-CM

## 2021-01-22 DIAGNOSIS — R1033 Periumbilical pain: Secondary | ICD-10-CM

## 2021-01-22 DIAGNOSIS — R197 Diarrhea, unspecified: Secondary | ICD-10-CM

## 2021-01-22 DIAGNOSIS — R109 Unspecified abdominal pain: Secondary | ICD-10-CM | POA: Insufficient documentation

## 2021-01-22 LAB — COMPREHENSIVE METABOLIC PANEL
ALT: 29 U/L (ref 0–35)
AST: 26 U/L (ref 0–37)
Albumin: 3.8 g/dL (ref 3.5–5.2)
Alkaline Phosphatase: 176 U/L — ABNORMAL HIGH (ref 39–117)
BUN: 18 mg/dL (ref 6–23)
CO2: 28 mEq/L (ref 19–32)
Calcium: 9.5 mg/dL (ref 8.4–10.5)
Chloride: 100 mEq/L (ref 96–112)
Creatinine, Ser: 0.97 mg/dL (ref 0.40–1.20)
GFR: 64.71 mL/min (ref >60.00)
Glucose, Bld: 220 mg/dL — ABNORMAL HIGH (ref 70–99)
Potassium: 3.1 mEq/L — ABNORMAL LOW (ref 3.5–5.1)
Sodium: 137 mEq/L (ref 135–145)
Total Bilirubin: 0.4 mg/dL (ref 0.2–1.2)
Total Protein: 7.3 g/dL (ref 6.0–8.3)

## 2021-01-22 LAB — CBC WITH DIFFERENTIAL/PLATELET
Basophils Absolute: 0 10*3/uL (ref 0.0–0.1)
Basophils Relative: 0.3 % (ref 0.0–3.0)
Eosinophils Absolute: 0.1 10*3/uL (ref 0.0–0.7)
Eosinophils Relative: 0.7 % (ref 0.0–5.0)
HCT: 35.6 % — ABNORMAL LOW (ref 36.0–46.0)
Hemoglobin: 11.9 g/dL — ABNORMAL LOW (ref 12.0–15.0)
Lymphocytes Relative: 12.8 % (ref 12.0–46.0)
Lymphs Abs: 0.9 10*3/uL (ref 0.7–4.0)
MCHC: 33.3 g/dL (ref 30.0–36.0)
MCV: 82.4 fl (ref 78.0–100.0)
Monocytes Absolute: 0.6 10*3/uL (ref 0.1–1.0)
Monocytes Relative: 9.1 % (ref 3.0–12.0)
Neutro Abs: 5.4 10*3/uL (ref 1.4–7.7)
Neutrophils Relative %: 77.1 % — ABNORMAL HIGH (ref 43.0–77.0)
Platelets: 245 10*3/uL (ref 150.0–400.0)
RBC: 4.32 Mil/uL (ref 3.87–5.11)
RDW: 13.7 % (ref 11.5–15.5)
WBC: 7.1 10*3/uL (ref 4.0–10.5)

## 2021-01-22 LAB — POC URINALSYSI DIPSTICK (AUTOMATED)
Bilirubin, UA: 1
Blood, UA: 50
Glucose, UA: NEGATIVE
Ketones, UA: NEGATIVE
Nitrite, UA: NEGATIVE
Protein, UA: POSITIVE — AB
Spec Grav, UA: 1.025 (ref 1.010–1.025)
Urobilinogen, UA: 0.2 E.U./dL
pH, UA: 6 (ref 5.0–8.0)

## 2021-01-22 LAB — LIPASE: Lipase: 9 U/L — ABNORMAL LOW (ref 11.0–59.0)

## 2021-01-22 NOTE — Assessment & Plan Note (Signed)
With periumbilical abd pain and tenderness and malaise  Had covid a month ago (no GI symptoms with that)  Labs ordered CT ordered (r/o diverticulitis or other process)

## 2021-01-22 NOTE — Assessment & Plan Note (Addendum)
Periumbilical pain and tenderness (with rebound), some suprapubic tenderness Also diarrhea (watery at times)  No diet changes Does have gastroparesis from DM -per pt this is worse  C/o heartburn (inc protonix to bid) Labs -stat CT abd/pelvis ordered  ER precautions discussed

## 2021-01-22 NOTE — Addendum Note (Signed)
Addended by: Loura Pardon A on: 01/22/2021 01:17 PM   Modules accepted: Orders

## 2021-01-22 NOTE — Progress Notes (Signed)
Subjective:    Patient ID: Alexa Woods, female    DOB: Sep 06, 1960, 60 y.o.   MRN: [id number]  This visit occurred during the SARS-CoV-2 public health emergency.  Safety protocols were in place, including screening questions prior to the visit, additional usage of staff PPE, and extensive cleaning of exam room while observing appropriate contact time as indicated for disinfecting solutions.   HPI Pt presents for abd pain and diarrhea   Wt Readings from Last 3 Encounters:  01/22/21 249 lb 4 oz (113.1 kg)  12/06/20 259 lb 1 oz (117.5 kg)  11/08/20 257 lb 1 oz (116.6 kg)   41.16 kg/m Last month she had covid - no diarrhea   1 1/2 weeks ago some middle abd pain/ left  Thought it was gastroparesis  Sharp pains then- with urgency of stool (occ incontinence)  Pain would wake her up (felt like needles in the stomach) No blood in stool  No dark black stool    She called yesterday c/o of lower mid abdominal pain over navel that is sharp on and off  Watery diarrhea after she eats  Of note still takes colace   Heartburn  Sometimes food feels like it stops 1/2 way down  Gastroparesis bothers her  Not very nauseated   She no longer takes melocicam   Takes protonix (held during covid)  No antacids  Occ pepto (helps a bit)   Has bm frequently  Sometimes urge and nothing happens     Last colonoscopy 4/12  GM had esoph ca Mother had GI problems   Larey Seat again the other day at home climbing over a rail   Lab Results  Component Value Date   CREATININE 1.06 11/06/2020   BUN 20 11/06/2020   NA 140 11/06/2020   K 3.9 11/06/2020   CL 102 11/06/2020   CO2 30 11/06/2020   Lab Results  Component Value Date   WBC 8.9 11/06/2020   HGB 13.1 11/06/2020   HCT 38.6 11/06/2020   MCV 82.1 11/06/2020   PLT 211.0 11/06/2020    Patient Active Problem List   Diagnosis Date Noted   Diarrhea 01/22/2021   Abdominal pain 01/22/2021   Left-sided chest wall pain 12/06/2020   Colon  cancer screening 11/08/2020   Current use of proton pump inhibitor 11/04/2020   Right foot injury 07/24/2020   Left ear pain 07/04/2020   Rib fractures 06/06/2020   Sprain of right wrist 04/10/2020   Trigger finger of right hand 12/20/2019   Hydrocephalus (HCC) 09/05/2019   Dry eyes 09/05/2019   Intractable headache 08/25/2019   Intractable nausea and vomiting 08/25/2019   Hypothyroidism 04/04/2019   Screening mammogram, encounter for 04/03/2019   Routine general medical examination at a health care facility 04/03/2019   Encounter for screening for HIV 04/03/2019   Encounter for hepatitis C screening test for low risk patient 04/03/2019   Visit for routine gyn exam 04/03/2019   Vitamin D deficiency 04/03/2019   Proximal humerus fracture 02/21/2019   Intertrigo 02/21/2019   History of left knee replacement 12/26/2017   Primary osteoarthritis involving multiple joints 12/13/2017   Primary osteoarthritis of left knee 12/06/2017   Chronic neck pain 08/13/2017   Pre-operative exam 04/05/2017   Morbid obesity (HCC) 01/21/2017   Joint swelling 12/20/2013   Tick bite of back 02/03/2013   Periodontal disease 02/03/2013   Hyperlipidemia associated with type 2 diabetes mellitus (HCC) 09/27/2012   Low back pain 07/22/2010   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  Type 2 diabetes mellitus with peripheral neuropathy ([place]) 11/30/2006   History of alcohol abuse 11/30/2006   Depression with anxiety 11/30/2006   Essential hypertension 11/30/2006   HEMORRHOIDS 11/30/2006   Allergic rhinitis 11/30/2006   Asthma 11/30/2006   GERD without esophagitis 11/30/2006   Psoriasis 11/30/2006   Past Medical History:  Diagnosis Date   Alcohol abuse, in remission    since 1998   Allergic rhinitis    Anxiety    Asthma    Bilateral lower extremity edema    Bulging lumbar disc    Bulging of cervical intervertebral disc    Chronic constipation    DDD (degenerative disc disease), lumbosacral     Depression    Dyspnea    GERD (gastroesophageal reflux disease)    Hemorrhoids    History of Bell's palsy 08/2007   left   History of chronic cystitis    IBS (irritable bowel syndrome)    Insulin dependent type 2 diabetes mellitus [facility])    endocrinologist-- dr Cruzita Lederer   Lower urinary tract symptoms (LUTS)    OA (osteoarthritis)    knees, back, hands, elbows   OSA (obstructive sleep apnea)    per study 06-08-2017 mild osa , cpap recommended , per pt insurance issue   Peripheral neuropathy    PONV (postoperative nausea and vomiting)    Psoriasis    S/P dilatation of esophageal stricture    Unspecified essential hypertension    Wears partial dentures    lower   Past Surgical History:  Procedure Laterality Date   CHOLECYSTECTOMY OPEN  1990   AND APPENDECTOMY   DOBUTAMINE STRESS ECHO  2009    normal stress echo, no evidence of ischemia   ELBOW SURGERY Bilateral RIGHT 2013;  LEFT 2016   for nerve damage   ESOPHAGOGASTRODUODENOSCOPY  07/2002   erythematous gastropathy   KNEE ARTHROSCOPY Left 11/ 2018   dr Wynelle Link  @ [facility]   KNEE ARTHROSCOPY W/ PARTIAL MEDIAL MENISCECTOMY Right 10/2008   and chondroplasty   SHOULDER ARTHROSCOPY WITH DISTAL CLAVICLE RESECTION Left 12/ 2011   dr dean   TOTAL KNEE ARTHROPLASTY Right 07-01-2009  dr Wynelle Link   [facility]   TOTAL KNEE ARTHROPLASTY Left 12/06/2017   Procedure: LEFT TOTAL LEFT KNEE ARTHROPLASTY;  Surgeon: Gaynelle Arabian, MD;  Location: [facility];  Service: Orthopedics;  Laterality: Left;   VAGINAL HYSTERECTOMY  2003   Social History   Tobacco Use   Smoking status: Former    Years: 25.00    Types: Cigarettes    Quit date: 12/30/2001    Years since quitting: 19.0   Smokeless tobacco: Never  Vaping Use   Vaping Use: Never used  Substance Use Topics   Alcohol use: No    Alcohol/week: 0.0 standard drinks    Comment: hx alcoholism--  stopped 1998    Drug use: No   Family History  Problem Relation Age of Onset   Alcohol abuse Mother     Hypertension Mother    Esophageal cancer Maternal Grandmother    Lung cancer Other        mat great uncle   Colon cancer Neg Hx    Allergies  Allergen Reactions   Bupropion Other (See Comments)    Pt is unsure   Bupropion Hcl Other (See Comments)    Pt is unsure   Cefuroxime Axetil Nausea Only   Gabapentin Other (See Comments)    Pt does not remember reaction  Pt does not remember reaction  Lidocaine Other (See Comments)    REACTION: unknown REACTION: unknown   Metformin Other (See Comments)    REACTION: GI REACTION: GI   Other Other (See Comments)   Paroxetine Other (See Comments)    REACTION: doesn't agree REACTION: doesn't agree   Propoxyphene Other (See Comments)    wheezing wheezing   Tramadol Other (See Comments)    REACTION: Causes Anxiety   Tramadol Hcl     REACTION: Causes Anxiety   Codeine Nausea And Vomiting and Rash   Sulfa Antibiotics Rash and Other (See Comments)   Current Outpatient Medications on File Prior to Visit  Medication Sig Dispense Refill   albuterol (VENTOLIN HFA) 108 (90 Base) MCG/ACT inhaler Inhale 2 puffs into the lungs every 4 (four) hours as needed for wheezing or shortness of breath. 18 each 3   BD INSULIN SYRINGE U/F 31G X 5/16" 0.5 ML MISC USE THREE TIMES PER DAY WITH R INSULIN & ONCE DAILY WITH LEVEMIR 300 each 3   cholecalciferol (VITAMIN D3) 25 MCG (1000 UNIT) tablet Take 5,000 Units by mouth daily.     docusate sodium (COLACE) 100 MG capsule Take 100 mg by mouth every morning.      fluticasone (FLONASE) 50 MCG/ACT nasal spray Place 2 sprays into both nostrils daily as needed for allergies or rhinitis. 16 g 5   hydrochlorothiazide (HYDRODIURIL) 25 MG tablet Take 1 tablet (25 mg total) by mouth daily. 90 tablet 3   insulin glargine (LANTUS) 100 UNIT/ML injection INJECT 0.30-0.35 MLS (30-35 UNITS TOTAL) INTO THE SKIN DAILY. 30 mL 3   insulin regular (HUMULIN R) 100 units/mL injection INJECT 0.14-0.24ML'S (14 TO 24 UNITS TOTAL) INTO THE  SKIN THREE TIMES A DAY BEFORE MEALS. 20 mL 9   Lancets (ONETOUCH ULTRASOFT) lancets Use to test blood sugar 4 times daily as instructed. 200 each 11   levothyroxine (SYNTHROID) 88 MCG tablet Take 1 tablet (88 mcg total) by mouth daily. 90 tablet 3   LORazepam (ATIVAN) 1 MG tablet Take 2 mg by mouth 2 (two) times daily as needed for anxiety or sleep.     naloxone (NARCAN) 4 MG/0.1ML LIQD nasal spray kit Narcan 4 mg/actuation nasal spray     ONETOUCH ULTRA test strip USE TO TEST BLOOD SUGAR 4 TIMES DAILY AS INSTRUCTED. 400 strip 0   oxyCODONE-acetaminophen (PERCOCET) 10-325 MG tablet Take 1 tablet by mouth every 4 (four) hours as needed for pain. Hold for SBP < = 105 12 tablet 0   pantoprazole (PROTONIX) 40 MG tablet Take 1 tablet (40 mg total) by mouth daily. 90 tablet 3   polyethylene glycol (MIRALAX / GLYCOLAX) packet Take 17 g by mouth daily as needed for mild constipation or moderate constipation.      potassium chloride (KLOR-CON M10) 10 MEQ tablet Take 1 tablet (10 mEq total) by mouth daily. 90 tablet 3   rosuvastatin (CRESTOR) 5 MG tablet Take 1 tablet (5 mg total) by mouth daily. 90 tablet 3   sertraline (ZOLOFT) 100 MG tablet Take 200 mg by mouth every morning.     tiZANidine (ZANAFLEX) 4 MG tablet Take 4 mg by mouth 2 (two) times daily as needed for muscle spasms.      triamcinolone cream (KENALOG) 0.1 % Apply 1 application topically 2 (two) times daily. To psoriasis areas 30 g 0   zolpidem (AMBIEN) 10 MG tablet Take 10 mg by mouth at bedtime as needed for sleep.     No current facility-administered medications on file prior  to visit.       Review of Systems  Constitutional:  Positive for appetite change and fatigue. Negative for activity change, fever and unexpected weight change.  HENT:  Negative for congestion, ear pain, rhinorrhea, sinus pressure and sore throat.   Eyes:  Negative for pain, redness and visual disturbance.  Respiratory:  Negative for cough, shortness of breath  and wheezing.   Cardiovascular:  Negative for chest pain and palpitations.  Gastrointestinal:  Positive for abdominal pain and diarrhea. Negative for abdominal distention, anal bleeding, blood in stool, constipation, nausea, rectal pain and vomiting.  Endocrine: Negative for polydipsia and polyuria.  Genitourinary:  Positive for dysuria and frequency. Negative for pelvic pain and urgency.  Musculoskeletal:  Positive for arthralgias. Negative for back pain and myalgias.  Skin:  Negative for pallor and rash.  Allergic/Immunologic: Negative for environmental allergies.  Neurological:  Negative for dizziness, syncope and headaches.  Hematological:  Negative for adenopathy. Does not bruise/bleed easily.  Psychiatric/Behavioral:  Negative for decreased concentration and dysphoric mood. The patient is not nervous/anxious.       Objective:   Physical Exam Constitutional:      General: She is not in acute distress.    Appearance: She is well-developed. She is obese. She is not ill-appearing or diaphoretic.     Comments: Uncomfortable appearing   HENT:     Head: Normocephalic and atraumatic.  Eyes:     General: No scleral icterus.    Conjunctiva/sclera: Conjunctivae normal.     Pupils: Pupils are equal, round, and reactive to light.  Cardiovascular:     Rate and Rhythm: Normal rate and regular rhythm.     Heart sounds: Normal heart sounds.  Pulmonary:     Effort: Pulmonary effort is normal. No respiratory distress.     Breath sounds: Normal breath sounds. No wheezing or rales.  Abdominal:     General: Abdomen is protuberant. Bowel sounds are normal. There is no distension or abdominal bruit.     Palpations: Abdomen is soft. There is no hepatomegaly, splenomegaly, mass or pulsatile mass.     Tenderness: There is abdominal tenderness in the periumbilical area and suprapubic area. There is left CVA tenderness and rebound. There is no right CVA tenderness or guarding. Negative signs include  Murphy's sign and McBurney's sign.     Hernia: No hernia is present.     Comments: Some rebound tenderness -periumbilical  No pain when shaking the table    Musculoskeletal:     Cervical back: Normal range of motion and neck supple.     Comments: Knee pain with walking   Lymphadenopathy:     Cervical: No cervical adenopathy.  Skin:    General: Skin is warm and dry.     Coloration: Skin is not pale.     Findings: No erythema or rash.     Comments: Fair complexion  Slightly sallow  Neurological:     Mental Status: She is alert.  Psychiatric:     Comments: Mildly anxious           Assessment & Plan:   Problem List Items Addressed This Visit       Other   Diarrhea    With periumbilical abd pain and tenderness and malaise  Had covid a month ago (no GI symptoms with that)  Labs ordered CT ordered (r/o diverticulitis or other process)         Relevant Orders   Comprehensive metabolic panel   CBC with Differential/Platelet  Lipase   Abdominal pain    Periumbilical pain and tenderness (with rebound), some suprapubic tenderness Also diarrhea (watery at times)  No diet changes Does have gastroparesis from DM -per pt this is worse  C/o heartburn (inc protonix to bid) Labs -stat CT abd/pelvis ordered  ER precautions discussed      Relevant Orders   Comprehensive metabolic panel   CBC with Differential/Platelet   Lipase   POCT Urinalysis Dipstick (Automated)   CT Abdomen Pelvis W Contrast

## 2021-01-22 NOTE — Patient Instructions (Signed)
Labs now  Urine sample if we can   I would like to get a CT scan    Increase protonix to twice daily for a few days   If worse or vomiting - go to the ER

## 2021-01-23 ENCOUNTER — Telehealth: Payer: Self-pay | Admitting: Family Medicine

## 2021-01-23 DIAGNOSIS — R197 Diarrhea, unspecified: Secondary | ICD-10-CM

## 2021-01-23 LAB — URINE CULTURE
MICRO NUMBER:: 12285565
SPECIMEN QUALITY:: ADEQUATE

## 2021-01-23 NOTE — Telephone Encounter (Signed)
Pt notified of Dr. Marliss Coots comments she will stop by the lab and pick up stool studies. Per Tam order is correct

## 2021-01-23 NOTE — Telephone Encounter (Signed)
I want to get a GI pathogen panel on her for the diarrhea when she can give [place] a stool sample    I put the order in  for GI pathogen panel for [facility] Let me know if this is not the right order  Thanks

## 2021-01-23 NOTE — Telephone Encounter (Signed)
-----   Message from [place] sent at 01/23/2021 12:05 PM EDT ----- Patient advised. Patient is still not feeling good. She continues to have diarrhea after eating, stomach still hurts-not as bad- but still hurts. Some nausea present. Pepto slows diarrhea down a little. Has been having hot flashes/sweats the past few days. She would like to know what to do for the diarrhea at this point?  When calling patient back please schedule her for lab appointment for next week as advised in the lab result note, I hung up with patient and forgot to make this. Thank you

## 2021-01-24 ENCOUNTER — Other Ambulatory Visit: Payer: PRIVATE HEALTH INSURANCE

## 2021-01-24 DIAGNOSIS — R197 Diarrhea, unspecified: Secondary | ICD-10-CM | POA: Diagnosis not present

## 2021-01-28 ENCOUNTER — Telehealth: Payer: Self-pay | Admitting: Family Medicine

## 2021-01-28 DIAGNOSIS — E876 Hypokalemia: Secondary | ICD-10-CM

## 2021-01-28 DIAGNOSIS — D649 Anemia, unspecified: Secondary | ICD-10-CM | POA: Insufficient documentation

## 2021-01-28 NOTE — Telephone Encounter (Signed)
-----   Message from Cloyd Stagers, RT sent at 01/24/2021 12:57 PM EDT ----- Regarding: Lab Orders for Wednesday 8.31.2022 Please place lab orders for Wednesday 8.31.2022, appt notes state "repeat labs" Thank you, Dyke Maes RT(R)

## 2021-01-29 ENCOUNTER — Other Ambulatory Visit: Payer: Self-pay

## 2021-01-29 ENCOUNTER — Other Ambulatory Visit (INDEPENDENT_AMBULATORY_CARE_PROVIDER_SITE_OTHER): Payer: PRIVATE HEALTH INSURANCE

## 2021-01-29 DIAGNOSIS — E876 Hypokalemia: Secondary | ICD-10-CM | POA: Diagnosis not present

## 2021-01-29 DIAGNOSIS — D649 Anemia, unspecified: Secondary | ICD-10-CM

## 2021-01-29 LAB — GASTROINTESTINAL PATHOGEN PANEL PCR
C. difficile Tox A/B, PCR: NOT DETECTED
Campylobacter, PCR: NOT DETECTED
Cryptosporidium, PCR: NOT DETECTED
E coli (ETEC) LT/ST PCR: NOT DETECTED
E coli (STEC) stx1/stx2, PCR: NOT DETECTED
E coli 0157, PCR: NOT DETECTED
Giardia lamblia, PCR: NOT DETECTED
Norovirus, PCR: NOT DETECTED
Rotavirus A, PCR: NOT DETECTED
Salmonella, PCR: DETECTED — AB
Shigella, PCR: NOT DETECTED

## 2021-01-29 NOTE — Progress Notes (Signed)
Stool is positive for salmonella  This is generally food bourne   This usually requires no treatment and gets better on its own but if pt is still having diarrhea (she is diabetic and more apt to have infections) then I want to treat   I pended azithromycin px for 1g now and then 500 mg daily for 5 d Please send to clinic of choice   Have her check in next week re: how symptoms are

## 2021-01-29 NOTE — Addendum Note (Signed)
Addended by: Loura Pardon A on: 01/29/2021 04:38 PM   Modules accepted: Orders

## 2021-01-30 LAB — CBC WITH DIFFERENTIAL/PLATELET
Basophils Absolute: 0.1 10*3/uL (ref 0.0–0.1)
Basophils Relative: 1 % (ref 0.0–3.0)
Eosinophils Absolute: 0.1 10*3/uL (ref 0.0–0.7)
Eosinophils Relative: 1.2 % (ref 0.0–5.0)
HCT: 36.4 % (ref 36.0–46.0)
Hemoglobin: 12.1 g/dL (ref 12.0–15.0)
Lymphocytes Relative: 22.1 % (ref 12.0–46.0)
Lymphs Abs: 2.2 10*3/uL (ref 0.7–4.0)
MCHC: 33.3 g/dL (ref 30.0–36.0)
MCV: 83.5 fl (ref 78.0–100.0)
Monocytes Absolute: 0.6 10*3/uL (ref 0.1–1.0)
Monocytes Relative: 6.4 % (ref 3.0–12.0)
Neutro Abs: 7 10*3/uL (ref 1.4–7.7)
Neutrophils Relative %: 69.3 % (ref 43.0–77.0)
Platelets: 296 10*3/uL (ref 150.0–400.0)
RBC: 4.36 Mil/uL (ref 3.87–5.11)
RDW: 13.9 % (ref 11.5–15.5)
WBC: 10.1 10*3/uL (ref 4.0–10.5)

## 2021-01-30 LAB — BASIC METABOLIC PANEL
BUN: 17 mg/dL (ref 6–23)
CO2: 34 mEq/L — ABNORMAL HIGH (ref 19–32)
Calcium: 9.3 mg/dL (ref 8.4–10.5)
Chloride: 102 mEq/L (ref 96–112)
Creatinine, Ser: 1.04 mg/dL (ref 0.40–1.20)
GFR: 59.51 mL/min — ABNORMAL LOW (ref >60.00)
Glucose, Bld: 76 mg/dL (ref 70–99)
Potassium: 4.5 mEq/L (ref 3.5–5.1)
Sodium: 141 mEq/L (ref 135–145)

## 2021-01-30 LAB — IRON: Iron: 47 ug/dL (ref 42–145)

## 2021-01-30 MED ORDER — AZITHROMYCIN 500 MG PO TABS: ORAL_TABLET | ORAL | 0 refills | Status: DC

## 2021-01-30 NOTE — Addendum Note (Signed)
Addended by: Tammi Sou on: 01/30/2021 11:40 AM   Modules accepted: Orders

## 2021-02-19 DIAGNOSIS — M6281 Muscle weakness (generalized): Secondary | ICD-10-CM | POA: Diagnosis not present

## 2021-02-19 DIAGNOSIS — Z5181 Encounter for therapeutic drug level monitoring: Secondary | ICD-10-CM | POA: Diagnosis not present

## 2021-02-19 DIAGNOSIS — Z79899 Other long term (current) drug therapy: Secondary | ICD-10-CM | POA: Diagnosis not present

## 2021-02-19 DIAGNOSIS — M5459 Other low back pain: Secondary | ICD-10-CM | POA: Diagnosis not present

## 2021-02-19 DIAGNOSIS — M542 Cervicalgia: Secondary | ICD-10-CM | POA: Diagnosis not present

## 2021-02-19 DIAGNOSIS — Z79891 Long term (current) use of opiate analgesic: Secondary | ICD-10-CM | POA: Diagnosis not present

## 2021-02-27 ENCOUNTER — Other Ambulatory Visit: Payer: Self-pay

## 2021-02-27 ENCOUNTER — Ambulatory Visit (INDEPENDENT_AMBULATORY_CARE_PROVIDER_SITE_OTHER): Payer: PRIVATE HEALTH INSURANCE | Admitting: Internal Medicine

## 2021-02-27 ENCOUNTER — Encounter: Payer: Self-pay | Admitting: Internal Medicine

## 2021-02-27 VITALS — BP 120/82 | HR 69 | Ht 65.25 in | Wt 250.0 lb

## 2021-02-27 DIAGNOSIS — E785 Hyperlipidemia, unspecified: Secondary | ICD-10-CM | POA: Diagnosis not present

## 2021-02-27 DIAGNOSIS — Z794 Long term (current) use of insulin: Secondary | ICD-10-CM

## 2021-02-27 DIAGNOSIS — E559 Vitamin D deficiency, unspecified: Secondary | ICD-10-CM | POA: Diagnosis not present

## 2021-02-27 DIAGNOSIS — E1159 Type 2 diabetes mellitus with other circulatory complications: Secondary | ICD-10-CM | POA: Diagnosis not present

## 2021-02-27 LAB — POCT GLYCOSYLATED HEMOGLOBIN (HGB A1C): Hemoglobin A1C: 7.3 % — AB (ref 4.0–5.6)
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
## 2021-02-27 NOTE — Patient Instructions (Addendum)
Please increase: - Lantus 30 units at bedtime - Humulin R insulin:  12-15 units before b'fast  12-15 units before lunch (17-18 units if eating cereals) 12-15 units before dinner If you plan to be more active after a meal, please reduce the insulin before that meal by 5 units.  Please come back for a follow-up appointment in 4 months.

## 2021-03-21 ENCOUNTER — Telehealth: Payer: Self-pay | Admitting: *Deleted

## 2021-03-21 NOTE — Telephone Encounter (Signed)
Received fax from "[facility]" saying pt wants to xfer her meds to them and request we fill out form or send meds electronically.   Called pt to see if this is legit and she does want meds xfered to them. Called pt but no answer and no VM set up. I will try to call back next week

## 2021-03-24 NOTE — Telephone Encounter (Signed)
Ms. Alexa Woods called in and stated that she never heard of them and she gets her scripts from [facility].

## 2021-03-24 NOTE — Telephone Encounter (Signed)
Tried calling the patient and he did not answer. Unable to LVM for patient to call back.

## 2021-03-26 NOTE — Telephone Encounter (Signed)
[facility] denied

## 2021-04-07 ENCOUNTER — Encounter: Payer: Self-pay | Admitting: Neurology

## 2021-04-07 ENCOUNTER — Ambulatory Visit (INDEPENDENT_AMBULATORY_CARE_PROVIDER_SITE_OTHER): Payer: Medicare Other | Admitting: Neurology

## 2021-04-07 VITALS — BP 156/91 | HR 63 | Ht 66.0 in | Wt 256.0 lb

## 2021-04-07 DIAGNOSIS — R55 Syncope and collapse: Secondary | ICD-10-CM

## 2021-04-07 DIAGNOSIS — W19XXXS Unspecified fall, sequela: Secondary | ICD-10-CM
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
## 2021-04-07 NOTE — Patient Instructions (Addendum)
Routine EEG  Will contact you to over the results  Referral to Physical Therapy  Follow up with your primary care doctor and return if worse

## 2021-04-14 ENCOUNTER — Ambulatory Visit: Payer: Medicare Other | Admitting: Neurology

## 2021-04-14 DIAGNOSIS — R55 Syncope and collapse: Secondary | ICD-10-CM | POA: Diagnosis not present

## 2021-04-15 ENCOUNTER — Telehealth: Payer: Self-pay | Admitting: *Deleted

## 2021-04-15 NOTE — Telephone Encounter (Signed)
-----   Message from Alric Ran, MD sent at 04/15/2021  9:41 AM EST ----- Please call and inform patient that her recent EEG (Brain wave test) was normal. In particular, there were no epileptiform discharges and no seizures. No further action is required on this test at this time. Please keep any upcoming appointments or tests and  call us with any interim questions, concerns, problems or updates. Thanks,   Alric Ran, MD

## 2021-04-15 NOTE — Procedures (Signed)
    History:  60 year old woman with episode of syncope   EEG classification: Awake and drowsy  Description of the recording: The background rhythms of this recording consists of a fairly well modulated medium amplitude alpha rhythm of 12 Hz that is reactive to eye opening and closure, at time, there is increase beta activity. As the record progresses, the patient appears to remain in the waking state throughout the recording. Photic stimulation was performed, did not show any abnormalities. Hyperventilation was also performed, did not show any abnormalities. Toward the end of the recording, the patient enters the drowsy state with slight symmetric slowing seen. The patient never enters stage II sleep. No abnormal epileptiform discharges seen during this recording. There was no focal slowing. EKG monitor shows no evidence of cardiac rhythm abnormalities with a heart rate of 60.  Impression: This is a normal EEG recording in the waking and drowsy state. No evidence of interictal epileptiform discharges seen. A normal EEG does not exclude a diagnosis of epilepsy.    Alric Ran, MD [facility]

## 2021-04-15 NOTE — Progress Notes (Signed)
Please call and inform patient that her recent EEG (Brain wave test) was normal. In particular, there were no epileptiform discharges and no seizures. No further action is required on this test at this time. Please keep any upcoming appointments or tests and  call us with any interim questions, concerns, problems or updates. Thanks,   Raydell Maners, MD 

## 2021-04-15 NOTE — Telephone Encounter (Signed)
Left patient a detailed message, with results, on voicemail of husband (ok per DPR).  Provided our number to call back with any questions.

## 2021-04-16 ENCOUNTER — Other Ambulatory Visit: Payer: Self-pay | Admitting: Internal Medicine

## 2021-04-16 ENCOUNTER — Other Ambulatory Visit: Payer: Self-pay | Admitting: Family Medicine

## 2021-05-05 ENCOUNTER — Emergency Department (HOSPITAL_COMMUNITY)
Admission: EM | Admit: 2021-05-05 | Discharge: 2021-05-06 | Disposition: A | Discharge status: Home / Self Care | Payer: PRIVATE HEALTH INSURANCE | Attending: Emergency Medicine | Admitting: Emergency Medicine

## 2021-05-05 ENCOUNTER — Other Ambulatory Visit: Payer: Self-pay

## 2021-05-05 ENCOUNTER — Encounter (HOSPITAL_COMMUNITY): Payer: Self-pay

## 2021-05-05 ENCOUNTER — Telehealth: Payer: Self-pay

## 2021-05-05 DIAGNOSIS — Z79899 Other long term (current) drug therapy: Secondary | ICD-10-CM | POA: Insufficient documentation

## 2021-05-05 DIAGNOSIS — E039 Hypothyroidism, unspecified: Secondary | ICD-10-CM | POA: Insufficient documentation

## 2021-05-05 DIAGNOSIS — R072 Precordial pain: Secondary | ICD-10-CM | POA: Diagnosis not present

## 2021-05-05 DIAGNOSIS — R1013 Epigastric pain: Secondary | ICD-10-CM | POA: Diagnosis present

## 2021-05-05 DIAGNOSIS — E1169 Type 2 diabetes mellitus with other specified complication: Secondary | ICD-10-CM | POA: Diagnosis not present

## 2021-05-05 DIAGNOSIS — J45909 Unspecified asthma, uncomplicated: Secondary | ICD-10-CM | POA: Diagnosis not present

## 2021-05-05 DIAGNOSIS — Z87891 Personal history of nicotine dependence: Secondary | ICD-10-CM | POA: Diagnosis not present

## 2021-05-05 DIAGNOSIS — E1142 Type 2 diabetes mellitus with diabetic polyneuropathy: Secondary | ICD-10-CM

## 2021-05-05 DIAGNOSIS — I10 Essential (primary) hypertension: Secondary | ICD-10-CM | POA: Diagnosis not present

## 2021-05-05 DIAGNOSIS — F418 Other specified anxiety disorders: Secondary | ICD-10-CM

## 2021-05-05 DIAGNOSIS — Z96653 Presence of artificial knee joint, bilateral: Secondary | ICD-10-CM | POA: Insufficient documentation

## 2021-05-05 DIAGNOSIS — Z5982 Transportation insecurity: Secondary | ICD-10-CM

## 2021-05-05 DIAGNOSIS — K219 Gastro-esophageal reflux disease without esophagitis: Secondary | ICD-10-CM | POA: Insufficient documentation

## 2021-05-05 DIAGNOSIS — E785 Hyperlipidemia, unspecified: Secondary | ICD-10-CM | POA: Diagnosis not present

## 2021-05-05 DIAGNOSIS — Z794 Long term (current) use of insulin: Secondary | ICD-10-CM | POA: Diagnosis not present

## 2021-05-05 DIAGNOSIS — R1033 Periumbilical pain: Secondary | ICD-10-CM

## 2021-05-05 DIAGNOSIS — Z5986 Financial insecurity: Secondary | ICD-10-CM

## 2021-05-05 LAB — COMPREHENSIVE METABOLIC PANEL
ALT: 37 U/L (ref 0–44)
AST: 35 U/L (ref 15–41)
Albumin: 4.3 g/dL (ref 3.5–5.0)
Alkaline Phosphatase: 184 U/L — ABNORMAL HIGH (ref 38–126)
Anion gap: 9 (ref 5–15)
BUN: 16 mg/dL (ref 6–20)
CO2: 26 mmol/L (ref 22–32)
Calcium: 9.3 mg/dL (ref 8.9–10.3)
Chloride: 102 mmol/L (ref 98–111)
Creatinine, Ser: 0.94 mg/dL (ref 0.44–1.00)
GFR, Estimated: >60 mL/min (ref >60)
Glucose, Bld: 112 mg/dL — ABNORMAL HIGH (ref 70–99)
Potassium: 3.7 mmol/L (ref 3.5–5.1)
Sodium: 137 mmol/L (ref 135–145)
Total Bilirubin: 0.2 mg/dL — ABNORMAL LOW (ref 0.3–1.2)
Total Protein: 8 g/dL (ref 6.5–8.1)

## 2021-05-05 LAB — CBC WITH DIFFERENTIAL/PLATELET
Abs Immature Granulocytes: 0.02 10*3/uL (ref 0.00–0.07)
Basophils Absolute: 0.1 10*3/uL (ref 0.0–0.1)
Basophils Relative: 1 %
Eosinophils Absolute: 0.1 10*3/uL (ref 0.0–0.5)
Eosinophils Relative: 1 %
HCT: 41.1 % (ref 36.0–46.0)
Hemoglobin: 13.5 g/dL (ref 12.0–15.0)
Immature Granulocytes: 0 %
Lymphocytes Relative: 26 %
Lymphs Abs: 2.1 10*3/uL (ref 0.7–4.0)
MCH: 27.6 pg (ref 26.0–34.0)
MCHC: 32.8 g/dL (ref 30.0–36.0)
MCV: 84 fL (ref 80.0–100.0)
Monocytes Absolute: 0.4 10*3/uL (ref 0.1–1.0)
Monocytes Relative: 5 %
Neutro Abs: 5.4 10*3/uL (ref 1.7–7.7)
Neutrophils Relative %: 67 %
Platelets: 251 10*3/uL (ref 150–400)
RBC: 4.89 MIL/uL (ref 3.87–5.11)
RDW: 13.2 % (ref 11.5–15.5)
WBC: 8.1 10*3/uL (ref 4.0–10.5)
nRBC: 0 % (ref 0.0–0.2)

## 2021-05-05 LAB — URINALYSIS, ROUTINE W REFLEX MICROSCOPIC
Bacteria, UA: NONE SEEN
Bilirubin Urine: NEGATIVE
Glucose, UA: NEGATIVE mg/dL
Ketones, ur: NEGATIVE mg/dL
Leukocytes,Ua: NEGATIVE
Nitrite: NEGATIVE
Protein, ur: NEGATIVE mg/dL
Specific Gravity, Urine: 1.016 (ref 1.005–1.030)
pH: 5 (ref 5.0–8.0)

## 2021-05-05 LAB — LACTIC ACID, PLASMA: Lactic Acid, Venous: 1 mmol/L (ref 0.5–1.9)

## 2021-05-05 LAB — LIPASE, BLOOD: Lipase: 25 U/L (ref 11–51)

## 2021-05-05 LAB — TROPONIN I (HIGH SENSITIVITY)
Troponin I (High Sensitivity): 3 ng/L (ref <18)
Troponin I (High Sensitivity): 4 ng/L (ref <18)

## 2021-05-05 MED ORDER — PANTOPRAZOLE SODIUM 40 MG PO TBEC: 40.0000 mg | DELAYED_RELEASE_TABLET | Freq: Every day | ORAL | 3 refills | Status: DC

## 2021-05-05 MED ORDER — SUCRALFATE 1 G PO TABS: 1.0000 g | ORAL_TABLET | Freq: Three times a day (TID) | ORAL | 0 refills | Status: DC

## 2021-05-05 NOTE — Telephone Encounter (Signed)
Agree with ER advisement  I will watch out for correspondence

## 2021-05-05 NOTE — ED Triage Notes (Addendum)
Patient c/o mid upper abdominal pain x 2 weeks. Patient reports vomiting last night, but none today.  Patient added that she has been c/o left shoulder pain for the past 2 days.

## 2021-05-05 NOTE — ED Notes (Signed)
Patient is unable to give a urine sample at this time

## 2021-05-05 NOTE — ED Provider Notes (Signed)
Emergency Medicine Provider Triage Evaluation Note  Alexa Woods , a 60 y.o. female  was evaluated in triage.  Pt complains of upper abdominal pain.  This has been ongoing for 2 weeks.  She states that this feels different than when she has had gastroparesis issues before.  She has been vomiting continuously.  No diarrhea.  She feels constipated.  Review of Systems  Positive: Abdominal pain, left shoulder pain, vomiting Negative: Syncope  Physical Exam  BP 127/76 (BP Location: Left Arm)   Pulse 71   Temp 98.2 F (36.8 C) (Oral)   Resp 16   Ht 5\' 5"  (1.651 m)   Wt 113.4 kg   SpO2 95%   BMI 41.60 kg/m  Gen:   Awake, no distress   Resp:  Normal effort  MSK:   Moves extremities without difficulty  Other:  Normal speech  Medical Decision Making  Medically screening exam initiated at 5:27 PM.  Appropriate orders placed.  Alexa Woods was informed that the remainder of the evaluation will be completed by another provider, this initial triage assessment does not replace that evaluation, and the importance of remaining in the ED until their evaluation is complete.  Note: Portions of this report may have been transcribed using voice recognition software. Every effort was made to ensure accuracy; however, inadvertent computerized transcription errors may be present    Ollen Gross 05/05/21 1728    Drenda Freeze, MD 05/05/21 2318

## 2021-05-05 NOTE — Telephone Encounter (Signed)
Per access nurse note pt agreed to comply with access nurse disposition to go to ED. Unable to reach pt by phone; pt may not have had time to get to an ED. Sending note to Dr Glori Bickers and Rodriguez Camp CMA.

## 2021-05-05 NOTE — Telephone Encounter (Signed)
Alexa Woods - Client TELEPHONE ADVICE RECORD [facility] Patient Name: Alexa Woods Gender: Female DOB: Aug 24, 1960 Age: 60 Y 27 M 18 D Return Phone Number: [phone] (Primary) Address: [address] City/ State/ Zip: [place]  [address] Client [facility] - Client Client Site [place] Provider Glori Bickers, Roque Lias - MD Contact Type Call Who Is Calling Patient / Member / Family / Caregiver Call Type Triage / Clinical Relationship To Patient Self Return Phone Number [phone] (Primary) Chief Complaint SEVERE ABDOMINAL PAIN - Severe pain in abdomen Reason for Call Symptomatic / Request for Alexa Woods states she has abdominal pain and it has been since last night. Translation No Nurse Assessment Nurse: Clovis Riley, RN, Georgina Peer Date/Time (Eastern Time): 05/05/2021 1:56:01 PM Confirm and document reason for call. If symptomatic, describe symptoms. ---Caller states she has abdominal pain for a couple of weeks and last night it got worse. Pain is in the left upper quad and pain is 5/10 and is constant and is worse when she eats. vomited last night. No fever. Does the patient have any new or worsening symptoms? ---Yes Will a triage be completed? ---Yes Related visit to physician within the last 2 weeks? ---No Does the PT have any chronic conditions? (i.e. diabetes, asthma, this includes High risk factors for pregnancy, etc.) ---Yes List chronic conditions. ---diabetes, gastroparesis Is this a behavioral health or substance abuse call? ---No Guidelines Guideline Title Affirmed Question Affirmed Notes Nurse Date/Time [name] Time) Abdominal Pain - Upper [1] Pain lasts > 10 minutes AND [2] age > 43 Clovis Riley, RNGeorgina Peer 05/05/2021 1:58:32 PM Disp. Time [name] Time) Disposition Final User 05/05/2021 1:50:11 PM Send to Urgent Marina Goodell,  [place] 05/05/2021 2:00:22 PM Go to ED Now Yes Clovis Riley, RN, Roslynn Amble NOTE: All timestamps contained within this report are represented as [place] Standard Time. CONFIDENTIALTY NOTICE: This fax transmission is intended only for the addressee. It contains information that is legally privileged, confidential or otherwise protected from use or disclosure. If you are not the intended recipient, you are strictly prohibited from reviewing, disclosing, copying using or disseminating any of this information or taking any action in reliance on or regarding this information. If you have received this fax in error, please notify us immediately by telephone so that we can arrange for its return to [place]. Phone: [phone], Toll-Free: [phone], Fax: [phone] Page: 2 of 2 Call Id: [id number] [place] Disagree/Comply Comply Caller Understands Yes PreDisposition Did not know what to do Care Advice Given Per Guideline GO TO ED NOW: * You need to be seen in the Emergency Department. * Go to the ED at ___________ [place] now. Drive carefully. NOTE TO TRIAGER - DRIVING: * Another adult should drive. * Patient should not delay going to the emergency department. CALL EMS 911 IF: * Confusion occurs * Passes out or becomes too weak to stand * Severe difficulty breathing occurs. CARE ADVICE given per Abdominal Pain, Upper (Adult) guideline. Referrals [place]

## 2021-05-05 NOTE — Discharge Instructions (Signed)
You are seen in the ER for chest pain and abdominal pain  The work-up is reassuring and normal.  For your chest pain however, given your risk factors we would like you to follow-up with a cardiologist. call the number provided to set up an appointment with them.  The abdominal pain appears to be related to gastritis.  Start taking the medications that are prescribed.  If your symptoms do not get better in 2 to 3 weeks, please call the GI doctor for an appointment.  Please return to the ER if you have worsening chest pain, shortness of breath, pain radiating to your jaw, shoulder, or back, sweats or fainting. Otherwise see the Cardiologist or your primary care doctor as requested.

## 2021-05-06 NOTE — ED Provider Notes (Signed)
[facility] DEPT Provider Note   CSN: [id number] Arrival date & time: 05/05/21  1651     History Chief Complaint  Patient presents with   Abdominal Pain    Alexa Woods is a 60 y.o. female.  HPI  HPI: A 59 year old patient with a history of treated diabetes, hypertension, hypercholesterolemia and obesity presents for evaluation of chest pain. Initial onset of pain was more than 6 hours ago. The patient's chest pain is described as heaviness/pressure/tightness and is not worse with exertion. The patient complains of nausea. The patient's chest pain is middle- or left-sided, is not well-localized, is not sharp and does not radiate to the arms/jaw/neck. The patient denies diaphoresis. The patient has a family history of coronary artery disease in a first-degree relative with onset less than age 67. The patient has no history of stroke, has no history of peripheral artery disease and has not smoked in the past 90 days.   Patient has history of alcoholism, esophagitis and gastritis. In addition to the chest pain, she reports epigastric abdominal pain for the last few days.  She is status postcholecystectomy.  Pain is constant, worse with p.o. intake and is nonradiating.  She has had some nausea but only one episode of emesis yesterday.  No blood in her vomitus or in her stools.  Past Medical History:  Diagnosis Date   Alcohol abuse, in remission    since 1998   Allergic rhinitis    Anxiety    Asthma    Bilateral lower extremity edema    Bulging lumbar disc    Bulging of cervical intervertebral disc    Chronic constipation    DDD (degenerative disc disease), lumbosacral    Depression    Dyspnea    GERD (gastroesophageal reflux disease)    Hemorrhoids    History of Bell's palsy 08/2007   left   History of chronic cystitis    IBS (irritable bowel syndrome)    Insulin dependent type 2 diabetes mellitus [facility])    endocrinologist-- dr Cruzita Lederer   Lower  urinary tract symptoms (LUTS)    OA (osteoarthritis)    knees, back, hands, elbows   OSA (obstructive sleep apnea)    per study 06-08-2017 mild osa , cpap recommended , per pt insurance issue   Peripheral neuropathy    PONV (postoperative nausea and vomiting)    Psoriasis    S/P dilatation of esophageal stricture    Unspecified essential hypertension    Wears partial dentures    lower    Patient Active Problem List   Diagnosis Date Noted   Anemia 01/28/2021   Diarrhea 01/22/2021   Abdominal pain 01/22/2021   Left-sided chest wall pain 12/06/2020   Colon cancer screening 11/08/2020   Current use of proton pump inhibitor 11/04/2020   Right foot injury 07/24/2020   Left ear pain 07/04/2020   Rib fractures 06/06/2020   Sprain of right wrist 04/10/2020   Trigger finger of right hand 12/20/2019   Hydrocephalus ([place]) 09/05/2019   Dry eyes 09/05/2019   Intractable headache 08/25/2019   Intractable nausea and vomiting 08/25/2019   Hypothyroidism 04/04/2019   Screening mammogram, encounter for 04/03/2019   Routine general medical examination at a health care facility 04/03/2019   Encounter for screening for HIV 04/03/2019   Encounter for hepatitis C screening test for low risk patient 04/03/2019   Visit for routine gyn exam 04/03/2019   Vitamin D deficiency 04/03/2019   Proximal humerus fracture 02/21/2019  Intertrigo 02/21/2019   History of left knee replacement 12/26/2017   Primary osteoarthritis involving multiple joints 12/13/2017   Primary osteoarthritis of left knee 12/06/2017   Chronic neck pain 08/13/2017   Pre-operative exam 04/05/2017   Morbid obesity ([place]) 01/21/2017   Joint swelling 12/20/2013   Tick bite of back 02/03/2013   Periodontal disease 02/03/2013   Hyperlipidemia associated with type 2 diabetes mellitus ([place]) 09/27/2012   Low back pain 07/22/2010   Hypokalemia 12/12/2008   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   Type 2 diabetes mellitus with peripheral  neuropathy ([place]) 11/30/2006   History of alcohol abuse 11/30/2006   Depression with anxiety 11/30/2006   Essential hypertension 11/30/2006   HEMORRHOIDS 11/30/2006   Allergic rhinitis 11/30/2006   Asthma 11/30/2006   GERD without esophagitis 11/30/2006   Psoriasis 11/30/2006    Past Surgical History:  Procedure Laterality Date   CHOLECYSTECTOMY OPEN  1990   AND APPENDECTOMY   DOBUTAMINE STRESS ECHO  2009    normal stress echo, no evidence of ischemia   ELBOW SURGERY Bilateral RIGHT 2013;  LEFT 2016   for nerve damage   ESOPHAGOGASTRODUODENOSCOPY  07/2002   erythematous gastropathy   KNEE ARTHROSCOPY Left 11/ 2018   dr Wynelle Link  @ [facility]   KNEE ARTHROSCOPY W/ PARTIAL MEDIAL MENISCECTOMY Right 10/2008   and chondroplasty   SHOULDER ARTHROSCOPY WITH DISTAL CLAVICLE RESECTION Left 12/ 2011   dr dean   TOTAL KNEE ARTHROPLASTY Right 07-01-2009  dr Wynelle Link   [facility]   TOTAL KNEE ARTHROPLASTY Left 12/06/2017   Procedure: LEFT TOTAL LEFT KNEE ARTHROPLASTY;  Surgeon: Gaynelle Arabian, MD;  Location: [facility];  Service: Orthopedics;  Laterality: Left;   VAGINAL HYSTERECTOMY  2003     OB History   No obstetric history on file.     Family History  Problem Relation Age of Onset   Alcohol abuse Mother    Hypertension Mother    Esophageal cancer Maternal Grandmother    Lung cancer Other        mat great uncle   Colon cancer Neg Hx     Social History   Tobacco Use   Smoking status: Former    Years: 25.00    Types: Cigarettes    Quit date: 12/30/2001    Years since quitting: 19.3   Smokeless tobacco: Never  Vaping Use   Vaping Use: Never used  Substance Use Topics   Alcohol use: No    Alcohol/week: 0.0 standard drinks    Comment: hx alcoholism--  stopped 1998    Drug use: No    Home Medications Prior to Admission medications   Medication Sig Start Date End Date Taking? Authorizing Provider  sucralfate (CARAFATE) 1 g tablet Take 1 tablet (1 g total) by mouth 4 (four) times daily -   with meals and at bedtime. 05/05/21  Yes Varney Biles, MD  albuterol (VENTOLIN HFA) 108 (90 Base) MCG/ACT inhaler Inhale 2 puffs into the lungs every 4 (four) hours as needed for wheezing or shortness of breath. 11/08/20   Tower, Wynelle Fanny, MD  BD INSULIN SYRINGE U/F 31G X 5/16" 0.5 ML MISC USE THREE TIMES PER DAY WITH R INSULIN & ONCE DAILY WITH LEVEMIR 06/28/20   [name], MD  cholecalciferol (VITAMIN D3) 25 MCG (1000 UNIT) tablet Take 5,000 Units by mouth daily.    [provider]  docusate sodium (COLACE) 100 MG capsule Take 100 mg by mouth every morning.     [provider]  fluticasone Asencion Islam)  50 MCG/ACT nasal spray Place 2 sprays into both nostrils daily as needed for allergies or rhinitis. 02/21/19   Tower, Wynelle Fanny, MD  hydrochlorothiazide (HYDRODIURIL) 25 MG tablet Take 1 tablet (25 mg total) by mouth daily. 11/08/20   Tower, Wynelle Fanny, MD  insulin glargine (LANTUS) 100 UNIT/ML injection INJECT 0.30-0.35 MLS (30-35 UNITS TOTAL) INTO THE SKIN DAILY. 10/21/20   [name], MD  insulin regular (HUMULIN R) 100 units/mL injection INJECT 0.14-0.24ML'S (14 TO 24 UNITS TOTAL) INTO THE SKIN THREE TIMES A DAY BEFORE MEALS. 04/16/21   [name], MD  Lancets [facility] ULTRASOFT) lancets Use to test blood sugar 4 times daily as instructed. 10/04/18   [name], MD  levothyroxine (SYNTHROID) 88 MCG tablet Take 1 tablet (88 mcg total) by mouth daily. 11/08/20   Tower, Wynelle Fanny, MD  LORazepam (ATIVAN) 1 MG tablet Take 2 mg by mouth 2 (two) times daily as needed for anxiety or sleep.    [provider]  naloxone (NARCAN) 4 MG/0.1ML LIQD nasal spray kit Narcan 4 mg/actuation nasal spray    [provider]  ONETOUCH ULTRA test strip USE TO TEST BLOOD SUGAR 4 TIMES DAILY AS INSTRUCTED. 01/20/21   [name], MD  oxyCODONE-acetaminophen (PERCOCET) 10-325 MG tablet Take 1 tablet by mouth every 4 (four) hours as needed for pain. Hold for SBP < = 105  12/15/17   Medina-Vargas, Monina C, NP  pantoprazole (PROTONIX) 40 MG tablet Take 1 tablet (40 mg total) by mouth daily. 05/05/21   Varney Biles, MD  polyethylene glycol (MIRALAX / GLYCOLAX) packet Take 17 g by mouth daily as needed for mild constipation or moderate constipation.     [provider]  potassium chloride (KLOR-CON M10) 10 MEQ tablet Take 1 tablet (10 mEq total) by mouth daily. 11/08/20   Tower, Wynelle Fanny, MD  rosuvastatin (CRESTOR) 5 MG tablet Take 1 tablet (5 mg total) by mouth daily. 11/08/20   Tower, Wynelle Fanny, MD  sertraline (ZOLOFT) 100 MG tablet Take 200 mg by mouth every morning.    [provider]  tiZANidine (ZANAFLEX) 4 MG tablet Take 4 mg by mouth 2 (two) times daily as needed for muscle spasms.  07/10/14   [provider]  triamcinolone cream (KENALOG) 0.1 % Apply 1 application topically 2 (two) times daily. To psoriasis areas 08/22/18   Tower, Wynelle Fanny, MD  zolpidem (AMBIEN) 10 MG tablet Take 10 mg by mouth at bedtime as needed for sleep.    [provider]    Allergies    Bupropion, Bupropion hcl, Cefuroxime axetil, Gabapentin, Lidocaine, Metformin, Other, Paroxetine, Propoxyphene, Tramadol, Tramadol hcl, Codeine, and Sulfa antibiotics  Review of Systems   Review of Systems  Constitutional:  Positive for activity change.  Cardiovascular:  Positive for chest pain.  Gastrointestinal:  Positive for abdominal pain, nausea and vomiting.  All other systems reviewed and are negative.  Physical Exam Updated Vital Signs BP (!) 174/91   Pulse 64   Temp 98.2 F (36.8 C) (Oral)   Resp (!) 21   Ht _0  (1.651 m)   Wt 113.4 kg   SpO2 97%   BMI 41.60 kg/m   Physical Exam Vitals and nursing note reviewed.  Constitutional:      Appearance: She is well-developed.  HENT:     Head: Atraumatic.  Cardiovascular:     Rate and Rhythm: Normal rate.  Pulmonary:     Effort: Pulmonary effort is normal.  Abdominal:  Comments: Epigastric  tenderness without any rebound or guarding  Musculoskeletal:     Cervical back: Normal range of motion and neck supple.  Skin:    General: Skin is warm and dry.  Neurological:     Mental Status: She is alert and oriented to person, place, and time.    ED Results / Procedures / Treatments   Labs (all labs ordered are listed, but only abnormal results are displayed) Labs Reviewed  COMPREHENSIVE METABOLIC PANEL - Abnormal; Notable for the following components:      Result Value   Glucose, Bld 112 (*)    Alkaline Phosphatase 184 (*)    Total Bilirubin 0.2 (*)    All other components within normal limits  URINALYSIS, ROUTINE W REFLEX MICROSCOPIC - Abnormal; Notable for the following components:   Hgb urine dipstick SMALL (*)    All other components within normal limits  CBC WITH DIFFERENTIAL/PLATELET  LIPASE, BLOOD  LACTIC ACID, PLASMA  TROPONIN I (HIGH SENSITIVITY)  TROPONIN I (HIGH SENSITIVITY)    EKG EKG Interpretation  Date/Time:  Monday May 05 2021 18:15:36 EST Ventricular Rate:  59 PR Interval:  213 QRS Duration: 99 QT Interval:  422 QTC Calculation: 418 R Axis:   -22 Text Interpretation: Sinus rhythm Prolonged PR interval Borderline left axis deviation Low voltage, precordial leads No acute changes Confirmed by Varney Biles [phone]) on 05/05/2021 9:52:15 PM  Radiology No results found.  Procedures Procedures   Medications Ordered in ED Medications - No data to display  ED Course  I have reviewed the triage vital signs and the nursing notes.  Pertinent labs & imaging results that were available during my care of the patient were reviewed by me and considered in my medical decision making (see chart for details).    MDM Rules/Calculators/A&P HEAR Score: 20                         60 year old female with multiple medical comorbidities comes in with chief complaint of chest pain and epigastric abdominal pain.  The 2 symptoms are separate  problems.  Chest pain is nonspecific.  Typically triggered by stress full event including arguments with family members.  Hear score is 4.  Troponins x2 are below the institutional cutoff for myocardial injury.  Patient has been chest pain-free since last night when she was involved in a stressful event.  We will discharge her with the request for follow-up with cardiology.  Patient is status postcholecystectomy and having epigastric abdominal pain.  I have reviewed the endoscopy from 2016 when she was diagnosed with gastritis.  My suspicion is that patient likely is having gastritis given the nature of her symptoms.  We will start her on Carafate along with changing her PPI to Protonix.  GI follow-up instructions provided, patient will follow-up with them if her symptoms do not get better and 2 to 3 weeks.  Final Clinical Impression(s) / ED Diagnoses Final diagnoses:  Epigastric abdominal pain  Precordial chest pain    Rx / DC Orders ED Discharge Orders          Ordered    sucralfate (CARAFATE) 1 g tablet  3 times daily with meals & bedtime        05/05/21 2358    pantoprazole (PROTONIX) 40 MG tablet  Daily        05/05/21 2358             Varney Biles, MD 05/06/21 0025

## 2021-05-06 NOTE — Telephone Encounter (Signed)
I don't see ER notes unless she went somewhere out of epic, please check in with her

## 2021-05-06 NOTE — Telephone Encounter (Signed)
Pt did go to ER notes in Epic please review

## 2021-05-06 NOTE — Telephone Encounter (Signed)
It looks like they changed her ppi to protonix and px carafate for presumed gastritis.  Please check in with her thurs or fri to see how she is doing  Thanks

## 2021-05-09 DIAGNOSIS — Z5986 Financial insecurity: Secondary | ICD-10-CM | POA: Insufficient documentation

## 2021-05-09 DIAGNOSIS — Z5982 Transportation insecurity: Secondary | ICD-10-CM | POA: Insufficient documentation

## 2021-05-09 NOTE — Telephone Encounter (Signed)
I know she has financial strain and transportation issues  I went ahead and put in a community care referral to see if someone can touch base and offer her some resources  This may take a while, but should be worth it just the same  If she says no, I will cancel it  Otherwise let her know she will get a call   Please check in with her next week if she has not made an appointment

## 2021-05-09 NOTE — Telephone Encounter (Signed)
Spoke with patient. Patient did not get Carafate RX. Patient was advised that she needs to get this RX started today. Patient states her stomach is the same/hurting. Patient is taking Protonix daily still. Pain is the same, especially after eating. Patient encouraged to get this medication. Patient is aware that she needs to get in with GI and cardiologist per the hospital but having to figure out how she will get there due to transportation. Patient was advised to follow up with [place] next week on how she is doing if not better

## 2021-05-13 NOTE — Telephone Encounter (Signed)
Left VM requesting pt to call the office back 

## 2021-05-15 LAB — HM DIABETES EYE EXAM

## 2021-05-19 DIAGNOSIS — H40033 Anatomical narrow angle, bilateral: Secondary | ICD-10-CM | POA: Diagnosis not present

## 2021-05-19 DIAGNOSIS — H2513 Age-related nuclear cataract, bilateral: Secondary | ICD-10-CM | POA: Diagnosis not present

## 2021-05-19 DIAGNOSIS — H25043 Posterior subcapsular polar age-related cataract, bilateral: Secondary | ICD-10-CM | POA: Diagnosis not present

## 2021-05-19 DIAGNOSIS — H25013 Cortical age-related cataract, bilateral: Secondary | ICD-10-CM | POA: Diagnosis not present

## 2021-05-19 DIAGNOSIS — H40031 Anatomical narrow angle, right eye: Secondary | ICD-10-CM | POA: Diagnosis not present

## 2021-05-20 NOTE — Telephone Encounter (Signed)
Left VM requesting pt to call the office back 

## 2021-05-22 ENCOUNTER — Encounter: Payer: Self-pay | Admitting: Family Medicine

## 2021-05-22 NOTE — Telephone Encounter (Signed)
Left VM requesting pt to call the office back 

## 2021-06-03 ENCOUNTER — Telehealth: Payer: Self-pay | Admitting: *Deleted

## 2021-06-03 NOTE — Telephone Encounter (Signed)
Spoke to patient by telephone and was advised that she had severe abdominal about a month ago and went to the ER. Patient stated that she is having the same symptoms. Patient stated that access nurse told her that she needed to go to the ER but she does not have any transportation because her husband is at work. Patient requested an appointment to see Dr. Glori Bickers. Patient scheduled for an office with at [facility] 06/05/21 at 9:30 am with Dr. Glori Bickers. Patient was given ER precautions and verbalized understanding. Patient stated if she is still having the pain when her husband gets home she will go to the ER.

## 2021-06-03 NOTE — Telephone Encounter (Signed)
PLEASE NOTE: All timestamps contained within this report are represented as [place] Standard Time. CONFIDENTIALTY NOTICE: This fax transmission is intended only for the addressee. It contains information that is legally privileged, confidential or otherwise protected from use or disclosure. If you are not the intended recipient, you are strictly prohibited from reviewing, disclosing, copying using or disseminating any of this information or taking any action in reliance on or regarding this information. If you have received this fax in error, please notify us immediately by telephone so that we can arrange for its return to [place]. Phone: [phone], Toll-Free: [phone], Fax: [phone] Page: 1 of 2 Call Id: [id number] [place] [facility] - Client TELEPHONE ADVICE RECORD AccessNurse Patient Name: Alexa Woods Gender: Female DOB: 03/31/1961 Age: 61 Y 2 M 17 D Return Phone Number: [phone] (Primary) Address: [address] [place]  [address] Client [place] [facility] - Client Client Site [place] Provider Glori Bickers, Roque Lias - MD Contact Type Call Who Is Calling Patient / Member / Family / Caregiver Call Type Triage / Clinical Relationship To Patient Self Return Phone Number [phone] (Primary) Chief Complaint SEVERE ABDOMINAL PAIN - Severe pain in abdomen Reason for Call Symptomatic / Request for Edith Endave states that she has a patient on the backline that she would like to have triaged. She is experiencing severe abdominal pain. Translation No Nurse Assessment Nurse: Lucky Cowboy, RN, Levada Dy Date/Time (Eastern Time): 06/03/2021 1:33:17 PM Confirm and document reason for call. If symptomatic, describe symptoms. ---Caller stated that she has been having constant, mild to severe abd pain getting worse for about a week. It is upper abd pain.  Intermittently severe, currently mild. Does the patient have any new or worsening symptoms? ---Yes Will a triage be completed? ---Yes Related visit to physician within the last 2 weeks? ---Yes Does the PT have any chronic conditions? (i.e. diabetes, asthma, this includes High risk factors for pregnancy, etc.) ---Yes List chronic conditions. ---diabetes, nerves, K+ issue, stomach issue, edema, HTN, fibromyalgia, chronic pain, high cholesterol, thyroid, insomnia State she's not taking any stomach meds ordered for "ulcers" that she might have because she stated that they interact with her thyroid med. Is this a behavioral health or substance abuse call? ---No Guidelines Guideline Title Affirmed Question Affirmed Notes Nurse Date/Time (Eastern Time) Abdominal Pain - Upper [1] Pain lasts > 10 minutes AND [2] age > 59 Dew, RN, Levada Dy 06/03/2021 1:38:25 PM PLEASE NOTE: All timestamps contained within this report are represented as [place] Standard Time. CONFIDENTIALTY NOTICE: This fax transmission is intended only for the addressee. It contains information that is legally privileged, confidential or otherwise protected from use or disclosure. If you are not the intended recipient, you are strictly prohibited from reviewing, disclosing, copying using or disseminating any of this information or taking any action in reliance on or regarding this information. If you have received this fax in error, please notify us immediately by telephone so that we can arrange for its return to [place]. Phone: [phone], Toll-Free: [phone], Fax: [phone] Page: 2 of 2 Call Id: [id number] [place]. Time [name] Time) Disposition Final User 06/03/2021 1:31:28 PM Send to Urgent Queue Wynema Birch 06/03/2021 1:40:50 PM Go to ED Now Yes Dew, RN, Marin Shutter Disagree/Comply Disagree Caller Understands Yes PreDisposition InappropriateToAsk Care Advice Given Per Guideline GO TO ED NOW: * You need to be seen  in the Emergency Department. * Go to  the ED at ___________ [place] now. Drive carefully. NOTE TO TRIAGER - DRIVING: * Patient should not delay going to the emergency department. * Another adult should drive. BRING MEDICINES: * Bring a list of your current medicines when you go to the Emergency Department (ER). * Bring the pill bottles too. This will help the doctor (or NP/PA) to make certain you are taking the right medicines and the right dose. CALL EMS 911 IF: * Confusion occurs * Passes out or becomes too weak to stand * Severe difficulty breathing occurs. CARE ADVICE given per Abdominal Pain, Upper (Adult) guideline. Comments User: Raford Pitcher, RN Date/Time [name] Time): 06/03/2021 1:41:29 PM Caller isn't sure if she's going to the ER or not, or if it will be later after her husband gets back home from work. Referrals GO TO FACILITY UNDECIDED

## 2021-06-03 NOTE — Telephone Encounter (Signed)
Agree with ER precautions, I will see her then

## 2021-06-05 ENCOUNTER — Other Ambulatory Visit: Payer: Self-pay

## 2021-06-05 ENCOUNTER — Emergency Department (HOSPITAL_COMMUNITY)
Admission: EM | Admit: 2021-06-05 | Discharge: 2021-06-06 | Disposition: A | Discharge status: Home / Self Care | Payer: Medicare HMO | Attending: Emergency Medicine | Admitting: Emergency Medicine

## 2021-06-05 ENCOUNTER — Emergency Department (HOSPITAL_COMMUNITY): Payer: Medicare HMO

## 2021-06-05 ENCOUNTER — Encounter (HOSPITAL_COMMUNITY): Payer: Self-pay

## 2021-06-05 ENCOUNTER — Ambulatory Visit: Payer: PRIVATE HEALTH INSURANCE | Admitting: Family Medicine

## 2021-06-05 DIAGNOSIS — N3090 Cystitis, unspecified without hematuria: Secondary | ICD-10-CM | POA: Diagnosis not present

## 2021-06-05 DIAGNOSIS — R109 Unspecified abdominal pain: Secondary | ICD-10-CM | POA: Diagnosis not present

## 2021-06-05 DIAGNOSIS — K3184 Gastroparesis: Secondary | ICD-10-CM | POA: Insufficient documentation

## 2021-06-05 DIAGNOSIS — I700 Atherosclerosis of aorta: Secondary | ICD-10-CM | POA: Diagnosis not present

## 2021-06-05 DIAGNOSIS — R1013 Epigastric pain: Secondary | ICD-10-CM | POA: Diagnosis not present

## 2021-06-05 LAB — URINALYSIS, ROUTINE W REFLEX MICROSCOPIC
Bacteria, UA: NONE SEEN
Bilirubin Urine: NEGATIVE
Glucose, UA: >=500 mg/dL — AB
Ketones, ur: NEGATIVE mg/dL
Leukocytes,Ua: NEGATIVE
Nitrite: NEGATIVE
Protein, ur: NEGATIVE mg/dL
Specific Gravity, Urine: 1.015 (ref 1.005–1.030)
pH: 5 (ref 5.0–8.0)

## 2021-06-05 LAB — COMPREHENSIVE METABOLIC PANEL
ALT: 32 U/L (ref 0–44)
AST: 35 U/L (ref 15–41)
Albumin: 4.1 g/dL (ref 3.5–5.0)
Alkaline Phosphatase: 190 U/L — ABNORMAL HIGH (ref 38–126)
Anion gap: 6 (ref 5–15)
BUN: 17 mg/dL (ref 6–20)
CO2: 28 mmol/L (ref 22–32)
Calcium: 9.2 mg/dL (ref 8.9–10.3)
Chloride: 103 mmol/L (ref 98–111)
Creatinine, Ser: 0.88 mg/dL (ref 0.44–1.00)
GFR, Estimated: >60 mL/min (ref >60)
Glucose, Bld: 233 mg/dL — ABNORMAL HIGH (ref 70–99)
Potassium: 3.8 mmol/L (ref 3.5–5.1)
Sodium: 137 mmol/L (ref 135–145)
Total Bilirubin: 0.4 mg/dL (ref 0.3–1.2)
Total Protein: 8 g/dL (ref 6.5–8.1)

## 2021-06-05 LAB — LIPASE, BLOOD: Lipase: 27 U/L (ref 11–51)

## 2021-06-05 LAB — CBC
HCT: 39.7 % (ref 36.0–46.0)
Hemoglobin: 12.7 g/dL (ref 12.0–15.0)
MCH: 27.4 pg (ref 26.0–34.0)
MCHC: 32 g/dL (ref 30.0–36.0)
MCV: 85.7 fL (ref 80.0–100.0)
Platelets: 253 10*3/uL (ref 150–400)
RBC: 4.63 MIL/uL (ref 3.87–5.11)
RDW: 13.4 % (ref 11.5–15.5)
WBC: 8 10*3/uL (ref 4.0–10.5)
nRBC: 0 % (ref 0.0–0.2)

## 2021-06-05 LAB — CBG MONITORING, ED: Glucose-Capillary: 136 mg/dL — ABNORMAL HIGH (ref 70–99)

## 2021-06-05 MED ORDER — IOHEXOL 350 MG/ML SOLN
80.0000 mL | Freq: Once | INTRAVENOUS | Status: AC | PRN
  Administered 2021-06-06: 80 mL via INTRAVENOUS

## 2021-06-05 NOTE — ED Provider Triage Note (Signed)
Emergency Medicine Provider Triage Evaluation Note  Alexa Woods , a 61 y.o. female  was evaluated in triage.  Pt complains of epigastric abdominal pain.  She does have a history of gastroparesis and was here for similar symptoms a month ago.  She states that she was unable take the medications at the time as interacted with her hypothyroid medication.  Epigastric pain radiates into left upper quadrant and into the back.  Reports associated nausea.  Has not had a normal bowel movement in 2 days.  Reports associated dysuria and urinary urgency.  Review of Systems  Positive:  Negative: See above   Physical Exam  BP (!) 156/89 (BP Location: Left Arm)    Pulse 72    Temp 98.5 F (36.9 C) (Oral)    Resp 18    Ht 5\' 5"  (1.651 m)    Wt 116.1 kg    SpO2 97%    BMI 42.60 kg/m  Gen:   Awake, no distress   Resp:  Normal effort  MSK:   Moves extremities without difficulty  Other:  Epigastric abdominal tenderness. left-sided flank tenderness  Medical Decision Making  Medically screening exam initiated at 7:19 PM.  Appropriate orders placed.  Alexa Woods was informed that the remainder of the evaluation will be completed by another provider, this initial triage assessment does not replace that evaluation, and the importance of remaining in the ED until their evaluation is complete.     Alexa Woods, [place] 06/05/21 1920

## 2021-06-05 NOTE — ED Triage Notes (Signed)
Patient reports that she is having mid and upper abdominal pain "for a long", but worse in the past 3 days. Patient also c/o nausea.. Patient states her last normal BM was 2-3 days ago.

## 2021-06-06 ENCOUNTER — Telehealth: Payer: Self-pay

## 2021-06-06 ENCOUNTER — Emergency Department (HOSPITAL_COMMUNITY): Payer: Medicare HMO

## 2021-06-06 ENCOUNTER — Encounter (HOSPITAL_COMMUNITY): Payer: Self-pay

## 2021-06-06 DIAGNOSIS — I700 Atherosclerosis of aorta: Secondary | ICD-10-CM | POA: Diagnosis not present

## 2021-06-06 DIAGNOSIS — K3184 Gastroparesis: Secondary | ICD-10-CM | POA: Diagnosis not present

## 2021-06-06 DIAGNOSIS — R109 Unspecified abdominal pain: Secondary | ICD-10-CM | POA: Diagnosis not present

## 2021-06-06 MED ORDER — METOCLOPRAMIDE HCL 10 MG PO TABS: 10.0000 mg | ORAL_TABLET | Freq: Four times a day (QID) | ORAL | 0 refills | Status: DC

## 2021-06-06 MED ORDER — SODIUM CHLORIDE 0.9 % IV BOLUS
1000.0000 mL | Freq: Once | INTRAVENOUS | Status: AC
  Administered 2021-06-06: 1000 mL via INTRAVENOUS

## 2021-06-06 MED ORDER — HALOPERIDOL LACTATE 5 MG/ML IJ SOLN
2.5000 mg | Freq: Once | INTRAMUSCULAR | Status: AC
Start: 2021-06-06 — End: 2021-06-06
  Administered 2021-06-06: 2.5 mg via INTRAVENOUS
  Filled 2021-06-06: qty 1

## 2021-06-06 MED ORDER — CEPHALEXIN 500 MG PO CAPS: 500.0000 mg | ORAL_CAPSULE | Freq: Three times a day (TID) | ORAL | 0 refills | Status: DC

## 2021-06-06 MED ORDER — CEFTRIAXONE SODIUM 1 G IJ SOLR
1.0000 g | Freq: Once | INTRAMUSCULAR | Status: AC
  Administered 2021-06-06: 1 g via INTRAVENOUS
  Filled 2021-06-06: qty 10

## 2021-06-06 MED ORDER — SUCRALFATE 1 G PO TABS
1.0000 g | ORAL_TABLET | Freq: Once | ORAL | Status: AC
Start: 2021-06-06 — End: 2021-06-06
  Administered 2021-06-06: 1 g via ORAL
  Filled 2021-06-06: qty 1

## 2021-06-06 NOTE — ED Provider Notes (Signed)
[facility] DEPT Provider Note   CSN: [id number] Arrival date & time: 06/05/21  1711     History  Chief Complaint  Patient presents with   Abdominal Pain   Nausea   Constipation    Alexa Woods is a 61 y.o. female.  HPI    61 year old comes in with chief complaint of abdominal pain, nausea.  She has history of gastroparesis.  Patient reports that she has been having some burning with urination over the last few days.  She is also having some back pain.  She has had UTI in the past.  More recently she has been having epigastric abdominal pain and nausea.  No vomiting.  Patient's abdominal pain is epigastric.  She has been taking omeprazole as prescribed.   Home Medications Prior to Admission medications   Medication Sig Start Date End Date Taking? Authorizing Provider  albuterol (VENTOLIN HFA) 108 (90 Base) MCG/ACT inhaler Inhale 2 puffs into the lungs every 4 (four) hours as needed for wheezing or shortness of breath. 11/08/20   Tower, Wynelle Fanny, MD  BD INSULIN SYRINGE U/F 31G X 5/16" 0.5 ML MISC USE THREE TIMES PER DAY WITH R INSULIN & ONCE DAILY WITH LEVEMIR 06/28/20   [name], MD  cholecalciferol (VITAMIN D3) 25 MCG (1000 UNIT) tablet Take 5,000 Units by mouth daily.    [provider]  docusate sodium (COLACE) 100 MG capsule Take 100 mg by mouth every morning.     [provider]  fluticasone (FLONASE) 50 MCG/ACT nasal spray Place 2 sprays into both nostrils daily as needed for allergies or rhinitis. 02/21/19   Tower, Wynelle Fanny, MD  hydrochlorothiazide (HYDRODIURIL) 25 MG tablet Take 1 tablet (25 mg total) by mouth daily. 11/08/20   Tower, Wynelle Fanny, MD  insulin glargine (LANTUS) 100 UNIT/ML injection INJECT 0.30-0.35 MLS (30-35 UNITS TOTAL) INTO THE SKIN DAILY. 10/21/20   [name], MD  insulin regular (HUMULIN R) 100 units/mL injection INJECT 0.14-0.24ML'S (14 TO 24 UNITS TOTAL) INTO THE SKIN THREE TIMES A DAY  BEFORE MEALS. 04/16/21   [name], MD  Lancets [facility] ULTRASOFT) lancets Use to test blood sugar 4 times daily as instructed. 10/04/18   [name], MD  levothyroxine (SYNTHROID) 88 MCG tablet Take 1 tablet (88 mcg total) by mouth daily. 11/08/20   Tower, Wynelle Fanny, MD  LORazepam (ATIVAN) 1 MG tablet Take 2 mg by mouth 2 (two) times daily as needed for anxiety or sleep.    [provider]  naloxone (NARCAN) 4 MG/0.1ML LIQD nasal spray kit Narcan 4 mg/actuation nasal spray    [provider]  ONETOUCH ULTRA test strip USE TO TEST BLOOD SUGAR 4 TIMES DAILY AS INSTRUCTED. 01/20/21   [name], MD  oxyCODONE-acetaminophen (PERCOCET) 10-325 MG tablet Take 1 tablet by mouth every 4 (four) hours as needed for pain. Hold for SBP < = 105 12/15/17   Medina-Vargas, Monina C, NP  pantoprazole (PROTONIX) 40 MG tablet Take 1 tablet (40 mg total) by mouth daily. 05/05/21   Varney Biles, MD  polyethylene glycol (MIRALAX / GLYCOLAX) packet Take 17 g by mouth daily as needed for mild constipation or moderate constipation.     [provider]  potassium chloride (KLOR-CON M10) 10 MEQ tablet Take 1 tablet (10 mEq total) by mouth daily. 11/08/20   Tower, Wynelle Fanny, MD  rosuvastatin (CRESTOR) 5 MG tablet Take 1 tablet (5 mg total) by mouth daily. 11/08/20   Tower, Wynelle Fanny, MD  sertraline (ZOLOFT) 100 MG tablet Take 200 mg by mouth every morning.    [provider]  sucralfate (CARAFATE) 1 g tablet Take 1 tablet (1 g total) by mouth 4 (four) times daily -  with meals and at bedtime. 05/05/21   Varney Biles, MD  tiZANidine (ZANAFLEX) 4 MG tablet Take 4 mg by mouth 2 (two) times daily as needed for muscle spasms.  07/10/14   [provider]  triamcinolone cream (KENALOG) 0.1 % Apply 1 application topically 2 (two) times daily. To psoriasis areas 08/22/18   Tower, Wynelle Fanny, MD  zolpidem (AMBIEN) 10 MG tablet Take 10 mg by mouth at bedtime as needed for sleep.     [provider]      Allergies    Bupropion, Bupropion hcl, Cefuroxime axetil, Gabapentin, Lidocaine, Metformin, Other, Paroxetine, Propoxyphene, Tramadol, Tramadol hcl, Codeine, and Sulfa antibiotics    Review of Systems   Review of Systems  Constitutional:  Positive for activity change.  Respiratory:  Negative for shortness of breath.   Cardiovascular:  Negative for chest pain.  Gastrointestinal:  Positive for abdominal pain, nausea and vomiting.  Genitourinary:  Positive for dysuria.   Physical Exam Updated Vital Signs BP (!) 219/94 (BP Location: Right Arm)    Pulse 69    Temp 98.5 F (36.9 C) (Oral)    Resp 16    Ht 5' 5"  (1.651 m)    Wt 116.1 kg    SpO2 94%    BMI 42.60 kg/m  Physical Exam Vitals and nursing note reviewed.  Constitutional:      Appearance: She is well-developed.  HENT:     Head: Atraumatic.  Cardiovascular:     Rate and Rhythm: Normal rate.  Pulmonary:     Effort: Pulmonary effort is normal.  Abdominal:     Tenderness: There is abdominal tenderness in the epigastric area. There is no guarding or rebound.  Musculoskeletal:     Cervical back: Normal range of motion and neck supple.  Skin:    General: Skin is warm and dry.  Neurological:     Mental Status: She is alert and oriented to person, place, and time.    ED Results / Procedures / Treatments   Labs (all labs ordered are listed, but only abnormal results are displayed) Labs Reviewed  COMPREHENSIVE METABOLIC PANEL - Abnormal; Notable for the following components:      Result Value   Glucose, Bld 233 (*)    Alkaline Phosphatase 190 (*)    All other components within normal limits  URINALYSIS, ROUTINE W REFLEX MICROSCOPIC - Abnormal; Notable for the following components:   Glucose, UA >=500 (*)    Hgb urine dipstick SMALL (*)    All other components within normal limits  CBG MONITORING, ED - Abnormal; Notable for the following components:   Glucose-Capillary 136 (*)    All other  components within normal limits  LIPASE, BLOOD  CBC    EKG None  Radiology CT ABDOMEN PELVIS W CONTRAST  Result Date: 06/06/2021 CLINICAL DATA:  Acute nonlocalized upper abdominal pain. Flank pain. EXAM: CT ABDOMEN AND PELVIS WITH CONTRAST TECHNIQUE: Multidetector CT imaging of the abdomen and pelvis was performed using the standard protocol following bolus administration of intravenous contrast. CONTRAST:  34m OMNIPAQUE IOHEXOL 350 MG/ML SOLN COMPARISON:  CT 08/25/2019 FINDINGS: Lower chest: The lung bases are clear. Hepatobiliary: No focal liver abnormality is seen. Status post cholecystectomy. No biliary dilatation. Pancreas: Mild fatty atrophy.  No ductal dilatation  or inflammation. Spleen: Normal in size without focal abnormality. Adrenals/Urinary Tract: Normal adrenal glands. No hydronephrosis or perinephric edema. Homogeneous renal enhancement with symmetric excretion on delayed phase imaging. No renal calculi. Small exophytic cysts from the upper right and left kidney. No solid lesion. Urinary bladder is partially distended without wall thickening. Stomach/Bowel: Tiny hiatal hernia. Stomach otherwise unremarkable. No small bowel obstruction or inflammation. Appendix is not visualized. No appendicitis. Mild submucosal fatty infiltration of the ascending colon. Moderate colonic stool burden. No colonic wall thickening or pericolonic edema. No diverticular changes. Vascular/Lymphatic: Mild aortic atherosclerosis. No aortic aneurysm. Patent portal vein. No acute vascular findings. No abdominopelvic adenopathy. Reproductive: Hysterectomy. Small chronic benign left ovarian calcification. No suspicious adnexal mass. Other: No free air, free fluid, or intra-abdominal fluid collection. Tiny fat containing umbilical hernia. Musculoskeletal: There are no acute or suspicious osseous abnormalities. Stable intramuscular lipoma within left sartorius. IMPRESSION: 1. No acute abnormality in the abdomen/pelvis. 2.  Moderate colonic stool burden, can be seen with constipation. Aortic Atherosclerosis (ICD10-I70.0). Electronically Signed   By: Keith Rake M.D.   On: 06/06/2021 01:40    Procedures Procedures    Medications Ordered in ED Medications  cefTRIAXone (ROCEPHIN) 1 g in sodium chloride 0.9 % 100 mL IVPB (1 g Intravenous New Bag/Given 06/06/21 1229)  iohexol (OMNIPAQUE) 350 MG/ML injection 80 mL (80 mLs Intravenous Contrast Given 06/06/21 0124)  haloperidol lactate (HALDOL) injection 2.5 mg (2.5 mg Intravenous Given 06/06/21 1224)  sucralfate (CARAFATE) tablet 1 g (1 g Oral Given 06/06/21 1224)  sodium chloride 0.9 % bolus 1,000 mL (1,000 mLs Intravenous New Bag/Given 06/06/21 1224)    ED Course/ Medical Decision Making/ A&P                           Medical Decision Making  61 year old female comes in with chief complaint of epigastric abdominal pain  The abdominal exam is overall reassuring.  She had a CT scan done earlier today which is negative for any acute findings. Labs reviewed independently, no evidence of DKA.  She is also complaining of burning with urination.  Urine analysis not showing any clear evidence of infection, however UTI is a clinical diagnosis and patient is having burning with urination along with some back pain and urinary frequency.  In fact that could be the cause for her nausea.  We will start her on Keflex.  Patient's BP is elevated.  It could be secondary to her reading in the waiting room for several hours while being uncomfortable.  1:00 PM Patients blood pressure has come down to 381R and 711A systolic.  She is resting comfortably.  Passed oral challenge.  Stable for discharge.        Final Clinical Impression(s) / ED Diagnoses Final diagnoses:  Gastroparesis  Cystitis    Rx / DC Orders ED Discharge Orders     None         Varney Biles, MD 06/06/21 1300

## 2021-06-06 NOTE — Discharge Instructions (Signed)
You are seen in the ER for abdominal pain and nausea.  The CT scan is reassuring.  Your blood pressure is improved.  We suspect that your symptoms are likely because of gastroparesis.  Clear liquid diet for the next 24 to 48 hours recommended.  Thereafter, smaller meals recommended for the first week.  You are having burning with urination and urinary frequency.  Antibiotics for bladder infection has been prescribed.  Please return to the ER if your symptoms worsen; you have increased pain, fevers, chills, inability to keep any medications down, confusion. Otherwise see the outpatient doctor as requested.

## 2021-06-06 NOTE — Telephone Encounter (Signed)
° °  Telephone encounter was:  Unsuccessful.  06/06/2021 Name: Alexa Woods MRN: [id number] DOB: 1960/11/11  Unsuccessful outbound call made today to assist with:  Transportation Needs   Outreach Attempt:  1st Attempt  A HIPAA compliant voice message was left requesting a return call.  Instructed patient to call back at [phone].  Daking Westervelt, AAS Paralegal, [facility]  [address], [place] [address] ??[email or web address]   ?? [phone]   [email or web address]

## 2021-06-09 DIAGNOSIS — H40032 Anatomical narrow angle, left eye: Secondary | ICD-10-CM | POA: Diagnosis not present

## 2021-06-10 ENCOUNTER — Telehealth: Payer: Self-pay | Admitting: Internal Medicine

## 2021-06-10 DIAGNOSIS — Z794 Long term (current) use of insulin: Secondary | ICD-10-CM

## 2021-06-10 MED ORDER — PEN NEEDLES 31G X 8 MM MISC: 3 refills | Status: DC

## 2021-06-10 NOTE — Telephone Encounter (Signed)
Rx sent to preferred pharmacy.

## 2021-06-10 NOTE — Telephone Encounter (Signed)
NEW RX FOR MEDICATION: Pen Needles -13mm for Lantus  PHARMACY:   [id number] [address], [place] Phone:  [phone]  Fax:  [phone]      HAS THE PATIENT CONTACTED THEIR PHARMACY?  No  IS THIS A 90 DAY SUPPLY : ?  IS PATIENT OUT OF MEDICATION: Yes  IF NOT; HOW MUCH IS LEFT: 0  LAST APPOINTMENT DATE: @11 /16/2022  NEXT APPOINTMENT DATE:@1 /31/2023  DO WE HAVE YOUR PERMISSION TO LEAVE A DETAILED MESSAGE?:Yes  OTHER COMMENTS:    **Let patient know to contact pharmacy at the end of the day to make sure medication is ready. **  ** Please notify patient to allow 48-72 hours to process**  **Encourage patient to contact the pharmacy for refills or they can request refills through [facility]**

## 2021-06-19 ENCOUNTER — Telehealth: Payer: Self-pay

## 2021-06-19 NOTE — Telephone Encounter (Signed)
° °  Telephone encounter was:  Unsuccessful.  06/19/2021 Name: Alexa Woods MRN: [id number] DOB: [birth year]  Unsuccessful outbound call made today to assist with:  Transportation Needs   Outreach Attempt:  2nd Attempt  A HIPAA compliant voice message was left requesting a return call.  Instructed patient to call back at [phone].  Cuinn Westerhold, AAS Paralegal, [facility]  [address], [place] [address] ??[email or web address]   ?? [phone]   [email or web address]

## 2021-06-20 ENCOUNTER — Telehealth: Payer: Self-pay

## 2021-06-20 NOTE — Telephone Encounter (Signed)
° °  Telephone encounter was:  Successful.  06/20/2021 Name: Alexa Woods MRN: [id number] DOB: 06-Jun-1960  Alexa Woods is a 61 y.o. year old female who is a primary care patient of Tower, Wynelle Fanny, MD . The community resource team was consulted for assistance with Transportation Needs   Care guide performed the following interventions: Spoke with patient, she gave permission to send a NCCARE360 referral to [employer] [employer].  She is also going to call [employer] to see if she has a transportation benefit.   Follow Up Plan:  Care guide will follow up with patient by phone over the next 0-16 days  Ulah Olmo, AAS Paralegal, [facility]  [address], [place] [address] ??[email or web address]   ?? [phone]   [email or web address]

## 2021-06-20 NOTE — Telephone Encounter (Signed)
° °  Telephone encounter was:  Successful.  06/20/2021 Name: Alexa Woods MRN: [id number] DOB: 04-Feb-1961  Alexa Woods is a 61 y.o. year old female who is a primary care patient of Tower, Wynelle Fanny, MD . The community resource team was consulted for assistance with Transportation Needs   Care guide performed the following interventions: Spoke with patient, she gave permission to send a NCCARE360 referral to [facility] [employer].  She is also going to call [employer] to see if she has a transportation benefit.   Follow Up Plan:  Care guide will follow up with patient by phone over the next 2-02 days  Lynette Noah, AAS Paralegal, [facility]  [address], [place] [address] ??[email or web address]   ?? [phone]   [email or web address]

## 2021-06-24 DIAGNOSIS — F250 Schizoaffective disorder, bipolar type: Secondary | ICD-10-CM | POA: Diagnosis not present

## 2021-06-27 ENCOUNTER — Telehealth: Payer: Self-pay

## 2021-06-27 NOTE — Telephone Encounter (Signed)
° °  Telephone encounter was:  Unsuccessful.  06/27/2021 Name: NAOKO DIPERNA MRN: [id number] DOB: 08-09-1960  Unsuccessful outbound call made today to assist with:  Transportation Needs   Outreach Attempt:  1st Attempt  A HIPAA compliant voice message was left requesting a return call.  Instructed patient to call back at [phone].Left message for Gerlene Fee at [facility] to follow-up with [id number] referral submitted 06/20/21.  Addaline Peplinski, AAS Paralegal, [facility]  [address], [place] [address] ??[email or web address]   ?? [phone]   [email or web address]

## 2021-06-30 ENCOUNTER — Telehealth: Payer: Self-pay

## 2021-06-30 NOTE — Telephone Encounter (Signed)
° °  Telephone encounter was:  Successful.  06/30/2021 Name: Alexa Woods MRN: [id number] DOB: 08/09/1960  Alexa Woods is a 61 y.o. year old female who is a primary care patient of Tower, Wynelle Fanny, MD . The community resource team was consulted for assistance with Transportation Needs   Care guide performed the following interventions: Received [id number] message from [facility] patient has been contacted and forms were mailed out 06/28/21 for patient to sign and return.  Follow Up Plan:  No further follow up planned at this time. The patient has been provided with needed resources.  Blue Ruggerio, AAS Paralegal, [facility]  [address], [place] [address] ??[email or web address]   ?? [phone]   [email or web address]

## 2021-07-01 ENCOUNTER — Encounter: Payer: Self-pay | Admitting: Internal Medicine

## 2021-07-01 ENCOUNTER — Other Ambulatory Visit: Payer: Self-pay

## 2021-07-01 ENCOUNTER — Ambulatory Visit (INDEPENDENT_AMBULATORY_CARE_PROVIDER_SITE_OTHER): Payer: BC Managed Care – PPO | Admitting: Internal Medicine

## 2021-07-01 VITALS — BP 130/78 | HR 66 | Ht 65.0 in | Wt 253.0 lb

## 2021-07-01 DIAGNOSIS — E1159 Type 2 diabetes mellitus with other circulatory complications: Secondary | ICD-10-CM | POA: Diagnosis not present

## 2021-07-01 DIAGNOSIS — E785 Hyperlipidemia, unspecified: Secondary | ICD-10-CM

## 2021-07-01 DIAGNOSIS — E559 Vitamin D deficiency, unspecified: Secondary | ICD-10-CM

## 2021-07-01 DIAGNOSIS — Z794 Long term (current) use of insulin: Secondary | ICD-10-CM | POA: Diagnosis not present

## 2021-07-01 DIAGNOSIS — I700 Atherosclerosis of aorta: Secondary | ICD-10-CM | POA: Diagnosis not present

## 2021-07-01 LAB — POCT GLYCOSYLATED HEMOGLOBIN (HGB A1C): Hemoglobin A1C: 8.3 % — AB (ref 4.0–5.6)

## 2021-07-01 NOTE — Patient Instructions (Addendum)
Please continue: - Lantus 30 units at bedtime  Please change: - Humulin R insulin:  16-18 units before b'fast  15-16 units before lunch (17-18 units if eating cereals) 12-14 units before dinner If you plan to be more active after a meal, please reduce the insulin before that meal by 5 units.  Please come back for a follow-up appointment in 4 months.
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
## 2021-07-02 ENCOUNTER — Encounter: Payer: Self-pay | Admitting: Internal Medicine

## 2021-07-02 ENCOUNTER — Ambulatory Visit (INDEPENDENT_AMBULATORY_CARE_PROVIDER_SITE_OTHER): Payer: BC Managed Care – PPO | Admitting: Internal Medicine

## 2021-07-02 VITALS — BP 124/60 | HR 68 | Ht 65.5 in | Wt 252.2 lb

## 2021-07-02 DIAGNOSIS — K219 Gastro-esophageal reflux disease without esophagitis: Secondary | ICD-10-CM

## 2021-07-02 DIAGNOSIS — K5900 Constipation, unspecified: Secondary | ICD-10-CM

## 2021-07-02 DIAGNOSIS — R1310 Dysphagia, unspecified: Secondary | ICD-10-CM

## 2021-07-02 DIAGNOSIS — R109 Unspecified abdominal pain: Secondary | ICD-10-CM

## 2021-07-02 DIAGNOSIS — Z1211 Encounter for screening for malignant neoplasm of colon: Secondary | ICD-10-CM

## 2021-07-02 NOTE — Progress Notes (Signed)
HISTORY OF PRESENT ILLNESS:  Alexa Woods is a pleasant 61 y.o. 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  She is sent today by her primary care provider regarding chief complaints of abdominal pain, constipation, and heartburn.  The patient has not been seen in this office since 2017 when she was evaluated for chronic longstanding left upper quadrant pain, GERD, and worsening constipation.  See that dictation.  Since that time she has continued with chronic abdominal complaints.  Generalized abdominal pain which fluctuates.  Often worse when constipated.  Difficult to differentiate from her fibromyalgia.  She tells me that she takes oxycodone 3 times daily.  She has approximately 3 bowel movements per week.  She is not on a bowel regimen.  She is status postcholecystectomy.  She was evaluated in the emergency room June 05, 2021 regarding abdominal pain.  I have reviewed that encounter.  CT scan of the abdomen and pelvis was negative except for increased colonic stool.  Blood work was unremarkable including comprehensive metabolic panel and CBC (except for elevated glucose).  Hemoglobin A1c from yesterday was 8.3.  She did undergo colonoscopy with Dr. Sharlett Iles in 2012.  This was normal.  Her last upper endoscopy was performed in 2016.  This was normal.  Being evaluated for atypical dysphagia at that time.  She still describes vague swallowing discomfort.  Unchanged.  She does have chronic reflux which is severe off pantoprazole.  However, on pantoprazole 40 mg daily her symptoms are well controlled.  She does have a remote history of gastroparesis on gastric emptying scan with 39% retained contents at 2 hours (normal less than 30).  She does have nausea, but no  vomiting.  No weight loss.  No melena.  No hematochezia.  She had been prescribed Reglan previously but does not take this stating that she had a side effect of thickened tongue sensation.  REVIEW OF SYSTEMS:  All non-GI ROS negative unless otherwise stated in the HPI except for sinus and allergy trouble, anxiety, arthritis, back pain, visual change, confusion, cough, depression, fatigue, hearing problems, heart murmur, itching, muscle cramps, night sweats, shortness of breath, skin rash, sleeping problems, lower extremity swelling, excessive thirst, excessive urination, urinary frequency, urinary leakage  Past Medical History:  Diagnosis Date   Alcohol abuse, in remission    since 1998   Allergic rhinitis    Anemia    Anxiety    Asthma    Bilateral lower extremity edema    Bulging lumbar disc    Bulging of cervical intervertebral disc    Chronic constipation    DDD (degenerative disc disease), lumbosacral    Depression    Dyspnea    Gastroparesis    GERD (gastroesophageal reflux disease)    Hemorrhoids    History of Bell's palsy 08/2007   left   History of chronic cystitis    IBS (irritable bowel syndrome)    Insulin dependent type 2 diabetes mellitus [facility])    endocrinologist-- dr Cruzita Lederer   Lower urinary tract symptoms (LUTS)    OA (osteoarthritis)    knees, back, hands, elbows   OSA (obstructive sleep apnea)    per study 06-08-2017 mild osa , cpap recommended , per pt insurance issue   Peripheral neuropathy    PONV (postoperative nausea and vomiting)    Psoriasis  S/P dilatation of esophageal stricture    Unspecified essential hypertension    Wears partial dentures    lower    Past Surgical History:  Procedure Laterality Date   CHOLECYSTECTOMY OPEN  1990   AND APPENDECTOMY   DOBUTAMINE STRESS ECHO  2009    normal stress echo, no evidence of ischemia   ELBOW SURGERY Bilateral RIGHT 2013;  LEFT 2016   for nerve damage   ESOPHAGOGASTRODUODENOSCOPY  07/2002    erythematous gastropathy   KNEE ARTHROSCOPY Left 11/ 2018   dr Wynelle Link  @ [facility]   KNEE ARTHROSCOPY W/ PARTIAL MEDIAL MENISCECTOMY Right 10/2008   and chondroplasty   SHOULDER ARTHROSCOPY WITH DISTAL CLAVICLE RESECTION Left 12/ 2011   dr dean   TOTAL KNEE ARTHROPLASTY Right 07-01-2009  dr Wynelle Link   [facility]   TOTAL KNEE ARTHROPLASTY Left 12/06/2017   Procedure: LEFT TOTAL LEFT KNEE ARTHROPLASTY;  Surgeon: Gaynelle Arabian, MD;  Location: [facility];  Service: Orthopedics;  Laterality: Left;   VAGINAL HYSTERECTOMY  2003    Social History SHARONDA LLAMAS  reports that she quit smoking about 19 years ago. Her smoking use included cigarettes. She has never used smokeless tobacco. She reports that she does not drink alcohol and does not use drugs.  family history includes Alcohol abuse in her mother; Celiac disease in her daughter; Diabetes in her brother, brother, brother, brother, mother, sister, sister, sister, and sister; Esophageal cancer in her maternal grandmother; Heart attack in her brother and sister; Heart attack (age of onset: 48) in her brother; Heart failure in her mother; Hypertension in her mother; Lung cancer in an other family member; Parkinson's disease in her brother; Thyroid disease in her niece.  Allergies  Allergen Reactions   Bupropion Hcl Other (See Comments)    Pt is unsure   Cefuroxime Axetil Nausea Only   Crestor [Rosuvastatin Calcium]     Pt states it makes her feel weird    Gabapentin Other (See Comments)    Pt does not remember reaction  Pt does not remember reaction    Lidocaine Other (See Comments)    REACTION: unknown REACTION: unknown   Metformin Other (See Comments)    REACTION: GI REACTION: GI   Other Other (See Comments)   Paroxetine Other (See Comments)    REACTION: doesn't agree REACTION: doesn't agree   Propoxyphene Other (See Comments)    wheezing wheezing   Tramadol Other (See Comments)    REACTION: Causes Anxiety   Tramadol Hcl     REACTION: Causes  Anxiety   Wellbutrin [Bupropion] Other (See Comments)    Pt is unsure   Codeine Nausea And Vomiting and Rash   Sulfa Antibiotics Rash and Other (See Comments)       PHYSICAL EXAMINATION: Vital signs: BP 124/60 (BP Location: Left Arm, Patient Position: Sitting, Cuff Size: Large)    Pulse 68    Ht 5' 5.5" (1.664 m) Comment: height measured without shoes   Wt 252 lb 4 oz (114.4 kg)    BMI 41.34 kg/m   Constitutional: Obese, chronically ill-appearing, slow-moving, no acute distress Psychiatric: alert and oriented x3, cooperative Eyes: extraocular movements intact, anicteric, conjunctiva pink Mouth: oral pharynx moist, no lesions Neck: supple no lymphadenopathy Cardiovascular: heart regular rate and rhythm. Lungs: clear to auscultation bilaterally Abdomen: Obese, soft, complaints of tenderness with minimal palpation over the anterior abdominal wall, nondistended, no obvious ascites, no peritoneal signs, normal bowel sounds, no organomegaly Rectal: Deferred until colonoscopy Extremities: no clubbing or cyanosis.  Trace lower extremity edema bilaterally Skin: no lesions on visible extremities Neuro: No focal deficits.  Cranial nerves intact  ASSESSMENT:  1.  Chronic abdominal pain.  In part due to fibromyalgia.  In part due to constipation.  No acute or worrisome process suspected. 2.  Chronic constipation.  Exacerbated by narcotics. 3.  Colon cancer screening.  Last examination 2012 was negative for neoplasia.  Due for follow-up. 4.  GERD.  Classic symptoms controlled with PPI.  Previous EGD unremarkable. 5.  Vague esophageal dysphagia.  Suspect dysmotility secondary to narcotics. 6.  Multiple significant medical problems including poorly controlled diabetes mellitus.   PLAN:  1.  Reflux precautions 2.  Weight loss 3.  Continue PPI daily 4.  Recommended MiraLAX 1 dose daily.  Increase if needed to achieve desired effect. 5.  Schedule colonoscopy for colon cancer screening and to  evaluate incidental complaints of abdominal pain and constipation.  The patient is HIGH RISK given her comorbidities and body habitus. 6.  Schedule upper endoscopy to evaluate abdominal pain.  The patient is high risk as above. 7.  Hold diabetic medications the morning of the procedure in order to avoid unwanted hypoglycemia 8.  Advised that she should avoid Reglan, as she is doing. 9.  Good diabetic control.  Stressed. A total time of 60 minutes was spent preparing to see the patient, reviewing x-rays, laboratories, and endoscopy reports.  Obtaining comprehensive history.  Reviewing outside emergency room and office evaluations, performing medically appropriate comprehensive physical examination, counseling patient regarding her multiple above listed issues, ordering multiple endoscopic procedures and medications.  Finally, documenting clinical information in the health record

## 2021-07-02 NOTE — Patient Instructions (Signed)
If you are age 59 or older, your body mass index should be between 23-30. Your Body mass index is 41.34 kg/m. If this is out of the aforementioned range listed, please consider follow up with your Primary Care Provider.  If you are age 29 or younger, your body mass index should be between 19-25. Your Body mass index is 41.34 kg/m. If this is out of the aformentioned range listed, please consider follow up with your Primary Care Provider.   ________________________________________________________  The [facility] providers would like to encourage you to use [facility] to communicate with providers for non-urgent requests or questions.  Due to long hold times on the telephone, sending your provider a message by [facility] may be a faster and more efficient way to get a response.  Please allow 48 business hours for a response.  Please remember that this is for non-urgent requests.  _______________________________________________________  I will call you to schedule your endosocpy/colonoscopy

## 2021-08-04 ENCOUNTER — Telehealth: Payer: Self-pay

## 2021-08-04 NOTE — Telephone Encounter (Signed)
Patient is calling with pain in her stomach. NO message left. Patient would like a call.

## 2021-08-11 ENCOUNTER — Encounter: Payer: Self-pay | Admitting: Family Medicine

## 2021-08-11 ENCOUNTER — Other Ambulatory Visit: Payer: Self-pay

## 2021-08-11 ENCOUNTER — Ambulatory Visit (INDEPENDENT_AMBULATORY_CARE_PROVIDER_SITE_OTHER): Payer: BC Managed Care – PPO | Admitting: Family Medicine

## 2021-08-11 VITALS — BP 146/82 | HR 67 | Temp 97.8°F | Wt 245.0 lb

## 2021-08-11 DIAGNOSIS — K219 Gastro-esophageal reflux disease without esophagitis: Secondary | ICD-10-CM | POA: Diagnosis not present

## 2021-08-11 DIAGNOSIS — R112 Nausea with vomiting, unspecified: Secondary | ICD-10-CM

## 2021-08-11 DIAGNOSIS — K029 Dental caries, unspecified: Secondary | ICD-10-CM | POA: Diagnosis not present

## 2021-08-11 DIAGNOSIS — K5909 Other constipation: Secondary | ICD-10-CM

## 2021-08-11 DIAGNOSIS — R1033 Periumbilical pain: Secondary | ICD-10-CM

## 2021-08-11 DIAGNOSIS — Z6841 Body Mass Index (BMI) 40.0 and over, adult: Secondary | ICD-10-CM | POA: Diagnosis not present

## 2021-08-11 NOTE — Assessment & Plan Note (Signed)
Most likely due to to diabetic gastroparesis ?Reviewed most recent note with Dr. Henrene Pastor from GI ?Planning endoscopy soon ?

## 2021-08-11 NOTE — Assessment & Plan Note (Signed)
Under care of GI ?Taking pantoprazole 40 mg daily ?Planning EGD ?Urged patient to call and get that scheduled ?

## 2021-08-11 NOTE — Assessment & Plan Note (Signed)
Most likely due to pain medication ?May play a role in patient's nausea vomiting and also periumbilical abdominal pain ?No changes on today's exam ?Discussed need for increased fluid intake and high-fiber diet ?Plan to try MiraLAX up to 3 times daily until bowel movements begin and then titrate to need ?Reviewed recent note from Dr. Henrene Pastor in GI ?Planning endoscopy and colonoscopy soon ?

## 2021-08-11 NOTE — Assessment & Plan Note (Signed)
Acute on chronic periumbilical abdominal pain that is dull in nature and intermittent in the context of diabetic gastroparesis and chronic constipation ?Some improvement with restart of generic Protonix by GI ?Labs reviewed and recent CT reviewed ?Unsure whether her nausea and vomiting is related to this or from her gastroparesis  ?Reviewed assessment and plan from Dr. Henrene Pastor from 07/02/2021 ?No new findings on exam today ?Urged patient to call the GI office to schedule her EGD and colonoscopy ?

## 2021-08-11 NOTE — Progress Notes (Signed)
Subjective:    Patient ID: Alexa Woods, female    DOB: [birth year], 61 y.o.   MRN: [id number]  This visit occurred during the SARS-CoV-2 public health emergency.  Safety protocols were in place, including screening questions prior to the visit, additional usage of staff PPE, and extensive cleaning of exam room while observing appropriate contact time as indicated for disinfecting solutions.   HPI Pt presents for abdominal pain   Wt Readings from Last 3 Encounters:  08/11/21 245 lb (111.1 kg)  07/02/21 252 lb 4 oz (114.4 kg)  07/01/21 253 lb (114.8 kg)   40.15 kg/m  Struggling with acute on chronic abd pain and n/v    She saw Dr Henrene Pastor on 2/1 for abd pain and constipation and heartburn  Notes remote h/o diabetic gastroenteritis  Was also seen 1/5 in ER for abd pain -reviewed   Recommended continued ppi Hold reglan Use miralax for constipation  Schedule colonoscopy  Sched EGD to eval  for abd pain  She has not called to schedule these yet   Had an oph migraine -last night she thinks  Nausea and vomiting all night  Now calmed down  Last vomited this am at 7 then could sleep No headache  Blood glucose is up and down No diarrhea  Is constipated and has not started miralax /but did buy it   CT scan of abd/pelvis 06/2021 Study Result  Narrative & Impression  CLINICAL DATA:  Acute nonlocalized upper abdominal pain. Flank pain.   EXAM: CT ABDOMEN AND PELVIS WITH CONTRAST   TECHNIQUE: Multidetector CT imaging of the abdomen and pelvis was performed using the standard protocol following bolus administration of intravenous contrast.   CONTRAST:  83m OMNIPAQUE IOHEXOL 350 MG/ML SOLN   COMPARISON:  CT 08/25/2019   FINDINGS: Lower chest: The lung bases are clear.   Hepatobiliary: No focal liver abnormality is seen. Status post cholecystectomy. No biliary dilatation.   Pancreas: Mild fatty atrophy.  No ductal dilatation or inflammation.   Spleen: Normal in size  without focal abnormality.   Adrenals/Urinary Tract: Normal adrenal glands. No hydronephrosis or perinephric edema. Homogeneous renal enhancement with symmetric excretion on delayed phase imaging. No renal calculi. Small exophytic cysts from the upper right and left kidney. No solid lesion. Urinary bladder is partially distended without wall thickening.   Stomach/Bowel: Tiny hiatal hernia. Stomach otherwise unremarkable. No small bowel obstruction or inflammation. Appendix is not visualized. No appendicitis. Mild submucosal fatty infiltration of the ascending colon. Moderate colonic stool burden. No colonic wall thickening or pericolonic edema. No diverticular changes.   Vascular/Lymphatic: Mild aortic atherosclerosis. No aortic aneurysm. Patent portal vein. No acute vascular findings. No abdominopelvic adenopathy.   Reproductive: Hysterectomy. Small chronic benign left ovarian calcification. No suspicious adnexal mass.   Other: No free air, free fluid, or intra-abdominal fluid collection. Tiny fat containing umbilical hernia.   Musculoskeletal: There are no acute or suspicious osseous abnormalities. Stable intramuscular lipoma within left sartorius.   IMPRESSION: 1. No acute abnormality in the abdomen/pelvis. 2. Moderate colonic stool burden, can be seen with constipation.   Aortic Atherosclerosis (ICD10-I70.0).    Pain is peri umbilical  On and off -mid and left at this point  Dull in nature  Feeling better from last night  Unsure if nausea and vomiting are related to this or more likely part of her diabetic gastroparesis  Had both eyes operated on with laser They hurt all the time   Needs approval for dental procedure Needs  many of her upper teeth extracted due to decay  Has had hyst and ccy in the past   Lab Results  Component Value Date   WBC 8.0 06/05/2021   HGB 12.7 06/05/2021   HCT 39.7 06/05/2021   MCV 85.7 06/05/2021   PLT 253 06/05/2021   Lab  Results  Component Value Date   CREATININE 0.88 06/05/2021   BUN 17 06/05/2021   NA 137 06/05/2021   K 3.8 06/05/2021   CL 103 06/05/2021   CO2 28 06/05/2021   Lab Results  Component Value Date   ALT 32 06/05/2021   AST 35 06/05/2021   ALKPHOS 190 (H) 06/05/2021   BILITOT 0.4 06/05/2021   Lab Results  Component Value Date   LIPASE 27 06/05/2021      Patient Active Problem List   Diagnosis Date Noted   Dental decay 08/11/2021   Transportation insecurity 05/09/2021   Financial insecurity 05/09/2021   Anemia 01/28/2021   Abdominal pain 01/22/2021   Left-sided chest wall pain 12/06/2020   Colon cancer screening 11/08/2020   Current use of proton pump inhibitor 11/04/2020   Right foot injury 07/24/2020   Left ear pain 07/04/2020   Rib fractures 06/06/2020   Sprain of right wrist 04/10/2020   Trigger finger of right hand 12/20/2019   Hydrocephalus ([place]) 09/05/2019   Dry eyes 09/05/2019   Intractable headache 08/25/2019   Intractable nausea and vomiting 08/25/2019   Hypothyroidism 04/04/2019   Screening mammogram, encounter for 04/03/2019   Routine general medical examination at a health care facility 04/03/2019   Encounter for screening for HIV 04/03/2019   Encounter for hepatitis C screening test for low risk patient 04/03/2019   Visit for routine gyn exam 04/03/2019   Vitamin D deficiency 04/03/2019   Proximal humerus fracture 02/21/2019   Intertrigo 02/21/2019   Chronic constipation 12/26/2017   History of left knee replacement 12/26/2017   Primary osteoarthritis involving multiple joints 12/13/2017   Primary osteoarthritis of left knee 12/06/2017   Chronic neck pain 08/13/2017   Pre-operative exam 04/05/2017   Morbid obesity ([place]) 01/21/2017   Joint swelling 12/20/2013   Tick bite of back 02/03/2013   Periodontal disease 02/03/2013   Hyperlipidemia associated with type 2 diabetes mellitus ([place]) 09/27/2012   Low back pain 07/22/2010   Hypokalemia 12/12/2008    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   Type 2 diabetes mellitus with peripheral neuropathy ([place]) 11/30/2006   History of alcohol abuse 11/30/2006   Depression with anxiety 11/30/2006   Essential hypertension 11/30/2006   HEMORRHOIDS 11/30/2006   Allergic rhinitis 11/30/2006   Asthma 11/30/2006   GERD without esophagitis 11/30/2006   Psoriasis 11/30/2006   Past Medical History:  Diagnosis Date   Alcohol abuse, in remission    since 1998   Allergic rhinitis    Anemia    Anxiety    Asthma    Bilateral lower extremity edema    Bulging lumbar disc    Bulging of cervical intervertebral disc    Chronic constipation    DDD (degenerative disc disease), lumbosacral    Depression    Dyspnea    Gastroparesis    GERD (gastroesophageal reflux disease)    Hemorrhoids    History of Bell's palsy 08/2007   left   History of chronic cystitis    IBS (irritable bowel syndrome)    Insulin dependent type 2 diabetes mellitus [facility])    endocrinologist-- dr Cruzita Lederer   Lower urinary tract symptoms (LUTS)    OA (osteoarthritis)  knees, back, hands, elbows   OSA (obstructive sleep apnea)    per study 06-08-2017 mild osa , cpap recommended , per pt insurance issue   Peripheral neuropathy    PONV (postoperative nausea and vomiting)    Psoriasis    S/P dilatation of esophageal stricture    Unspecified essential hypertension    Wears partial dentures    lower   Past Surgical History:  Procedure Laterality Date   CHOLECYSTECTOMY OPEN  1990   AND APPENDECTOMY   DOBUTAMINE STRESS ECHO  2009    normal stress echo, no evidence of ischemia   ELBOW SURGERY Bilateral RIGHT 2013;  LEFT 2016   for nerve damage   ESOPHAGOGASTRODUODENOSCOPY  07/2002   erythematous gastropathy   eye sugery Bilateral    KNEE ARTHROSCOPY Left 11/ 2018   dr Wynelle Link  @ [facility]   KNEE ARTHROSCOPY W/ PARTIAL MEDIAL MENISCECTOMY Right 10/2008   and chondroplasty   SHOULDER ARTHROSCOPY WITH DISTAL CLAVICLE RESECTION Left 12/  2011   dr dean   TOTAL KNEE ARTHROPLASTY Right 07-01-2009  dr Wynelle Link   [facility]   TOTAL KNEE ARTHROPLASTY Left 12/06/2017   Procedure: LEFT TOTAL LEFT KNEE ARTHROPLASTY;  Surgeon: Gaynelle Arabian, MD;  Location: [facility];  Service: Orthopedics;  Laterality: Left;   VAGINAL HYSTERECTOMY  2003   Social History   Tobacco Use   Smoking status: Former    Years: 25.00    Types: Cigarettes    Quit date: 12/30/2001    Years since quitting: 19.6   Smokeless tobacco: Never  Vaping Use   Vaping Use: Never used  Substance Use Topics   Alcohol use: No    Alcohol/week: 0.0 standard drinks    Comment: hx alcoholism--  stopped 1998    Drug use: No   Family History  Problem Relation Age of Onset   Alcohol abuse Mother    Hypertension Mother    Diabetes Mother    Heart failure Mother    Diabetes Sister    Heart attack Sister    Diabetes Sister    Diabetes Sister    Diabetes Sister    Diabetes Brother    Parkinson's disease Brother    Heart attack Brother    Heart attack Brother 24   Diabetes Brother    Diabetes Brother    Diabetes Brother    Esophageal cancer Maternal Grandmother    Lung cancer Other        mat great uncle   Celiac disease Daughter    Thyroid disease Niece    Colon cancer Neg Hx    Allergies  Allergen Reactions   Bupropion Hcl Other (See Comments)    Pt is unsure   Cefuroxime Axetil Nausea Only   Crestor [Rosuvastatin Calcium]     Pt states it makes her feel weird    Gabapentin Other (See Comments)    Pt does not remember reaction  Pt does not remember reaction    Lidocaine Other (See Comments)    REACTION: unknown REACTION: unknown   Metformin Other (See Comments)    REACTION: GI REACTION: GI   Other Other (See Comments)   Paroxetine Other (See Comments)    REACTION: doesn't agree REACTION: doesn't agree   Propoxyphene Other (See Comments)    wheezing wheezing   Tramadol Other (See Comments)    REACTION: Causes Anxiety   Tramadol Hcl     REACTION:  Causes Anxiety   Wellbutrin [Bupropion] Other (See Comments)    Pt is  unsure   Codeine Nausea And Vomiting and Rash   Sulfa Antibiotics Rash and Other (See Comments)   Current Outpatient Medications on File Prior to Visit  Medication Sig Dispense Refill   albuterol (VENTOLIN HFA) 108 (90 Base) MCG/ACT inhaler Inhale 2 puffs into the lungs every 4 (four) hours as needed for wheezing or shortness of breath. 18 each 3   BD INSULIN SYRINGE U/F 31G X 5/16" 0.5 ML MISC USE THREE TIMES PER DAY WITH R INSULIN & ONCE DAILY WITH LEVEMIR 300 each 3   cholecalciferol (VITAMIN D3) 25 MCG (1000 UNIT) tablet Take 5,000 Units by mouth daily.     docusate sodium (COLACE) 100 MG capsule Take 100 mg by mouth every morning.      fluticasone (FLONASE) 50 MCG/ACT nasal spray Place 2 sprays into both nostrils daily as needed for allergies or rhinitis. 16 g 5   hydrochlorothiazide (HYDRODIURIL) 25 MG tablet Take 1 tablet (25 mg total) by mouth daily. 90 tablet 3   insulin glargine (LANTUS) 100 UNIT/ML injection INJECT 0.30-0.35 MLS (30-35 UNITS TOTAL) INTO THE SKIN DAILY. 30 mL 3   Insulin Pen Needle (PEN NEEDLES) 31G X 8 MM MISC Use as instructed for daily injections 100 each 3   insulin regular (HUMULIN R) 100 units/mL injection INJECT 0.14-0.24ML'S (14 TO 24 UNITS TOTAL) INTO THE SKIN THREE TIMES A DAY BEFORE MEALS. 20 mL 9   Lancets (ONETOUCH ULTRASOFT) lancets Use to test blood sugar 4 times daily as instructed. 200 each 11   levothyroxine (SYNTHROID) 88 MCG tablet Take 1 tablet (88 mcg total) by mouth daily. 90 tablet 3   LORazepam (ATIVAN) 2 MG tablet Take 2 mg by mouth 2 (two) times daily.     metoCLOPramide (REGLAN) 10 MG tablet Take 1 tablet (10 mg total) by mouth every 6 (six) hours. 30 tablet 0   naloxone (NARCAN) 4 MG/0.1ML LIQD nasal spray kit      ONETOUCH ULTRA test strip USE TO TEST BLOOD SUGAR 4 TIMES DAILY AS INSTRUCTED. 400 strip 0   oxyCODONE-acetaminophen (PERCOCET) 10-325 MG tablet Take 1  tablet by mouth every 4 (four) hours as needed for pain. Hold for SBP < = 105 12 tablet 0   pantoprazole (PROTONIX) 40 MG tablet Take 1 tablet (40 mg total) by mouth daily. 90 tablet 3   potassium chloride (KLOR-CON M10) 10 MEQ tablet Take 1 tablet (10 mEq total) by mouth daily. 90 tablet 3   sertraline (ZOLOFT) 100 MG tablet Take 200 mg by mouth every morning.     tiZANidine (ZANAFLEX) 4 MG tablet Take 4 mg by mouth 2 (two) times daily as needed for muscle spasms.      triamcinolone cream (KENALOG) 0.1 % Apply 1 application topically 2 (two) times daily. To psoriasis areas 30 g 0   zolpidem (AMBIEN) 10 MG tablet Take 10 mg by mouth at bedtime as needed for sleep.     No current facility-administered medications on file prior to visit.    Review of Systems  Constitutional:  Positive for fatigue. Negative for activity change, appetite change, fever and unexpected weight change.  HENT:  Negative for congestion, ear pain, rhinorrhea, sinus pressure and sore throat.   Eyes:  Negative for pain, redness and visual disturbance.  Respiratory:  Negative for cough, shortness of breath and wheezing.   Cardiovascular:  Negative for chest pain and palpitations.  Gastrointestinal:  Positive for abdominal pain, constipation, nausea and vomiting. Negative for abdominal distention, anal bleeding, blood  in stool, diarrhea and rectal pain.  Endocrine: Negative for polydipsia and polyuria.  Genitourinary:  Negative for dysuria, frequency and urgency.  Musculoskeletal:  Positive for arthralgias. Negative for back pain and myalgias.  Skin:  Negative for pallor and rash.  Allergic/Immunologic: Negative for environmental allergies.  Neurological:  Negative for dizziness, syncope and headaches.  Hematological:  Negative for adenopathy. Does not bruise/bleed easily.  Psychiatric/Behavioral:  Negative for decreased concentration and dysphoric mood. The patient is not nervous/anxious.       Objective:   Physical  Exam Constitutional:      General: She is not in acute distress.    Appearance: She is well-developed. She is obese. She is not ill-appearing or diaphoretic.  HENT:     Head: Normocephalic and atraumatic.  Eyes:     Conjunctiva/sclera: Conjunctivae normal.     Pupils: Pupils are equal, round, and reactive to light.  Neck:     Thyroid: No thyromegaly.     Vascular: No carotid bruit or JVD.  Cardiovascular:     Rate and Rhythm: Normal rate and regular rhythm.     Heart sounds: Normal heart sounds.    No gallop.  Pulmonary:     Effort: Pulmonary effort is normal. No respiratory distress.     Breath sounds: Normal breath sounds. No wheezing or rales.  Abdominal:     General: Abdomen is protuberant. Bowel sounds are normal. There is no distension or abdominal bruit.     Palpations: Abdomen is soft. There is no hepatomegaly, splenomegaly or mass.     Tenderness: There is abdominal tenderness in the epigastric area and periumbilical area. There is no right CVA tenderness, left CVA tenderness, guarding or rebound. Negative signs include Murphy's sign, Rovsing's sign and McBurney's sign.  Musculoskeletal:     Cervical back: Normal range of motion and neck supple.     Right lower leg: No edema.     Left lower leg: No edema.  Lymphadenopathy:     Cervical: No cervical adenopathy.  Skin:    General: Skin is warm and dry.     Coloration: Skin is not pale.     Findings: No rash.  Neurological:     Mental Status: She is alert.     Coordination: Coordination normal.     Deep Tendon Reflexes: Reflexes are normal and symmetric. Reflexes normal.  Psychiatric:        Mood and Affect: Mood normal.        Cognition and Memory: Cognition and memory normal.     Comments: Mildly anxious Discussed new stressor of having her sister moved in with her        Assessment & Plan:   Problem List Items Addressed This Visit       Digestive   Chronic constipation    Most likely due to pain  medication May play a role in patient's nausea vomiting and also periumbilical abdominal pain No changes on today's exam Discussed need for increased fluid intake and high-fiber diet Plan to try MiraLAX up to 3 times daily until bowel movements begin and then titrate to need Reviewed recent note from Dr. Henrene Pastor in GI Planning endoscopy and colonoscopy soon      Dental decay    Planning dental extractions  No medical restrictions to this No blood thinners at this time       GERD without esophagitis    Under care of GI Taking pantoprazole 40 mg daily Planning EGD Urged patient to call  and get that scheduled      Intractable nausea and vomiting    Most likely due to to diabetic gastroparesis Reviewed most recent note with Dr. Henrene Pastor from GI Planning endoscopy soon        Other   Abdominal pain - Primary    Acute on chronic periumbilical abdominal pain that is dull in nature and intermittent in the context of diabetic gastroparesis and chronic constipation Some improvement with restart of generic Protonix by GI Labs reviewed and recent CT reviewed Unsure whether her nausea and vomiting is related to this or from her gastroparesis  Reviewed assessment and plan from Dr. Henrene Pastor from 07/02/2021 No new findings on exam today Urged patient to call the GI office to schedule her EGD and colonoscopy

## 2021-08-11 NOTE — Patient Instructions (Addendum)
Call Dr Blanch Media office to set up your testing  ? ?I think you have diabetic gastroparesis and also constipation  ?Eat small meals and drink lots of fluids  ? ?Start the miralax (mixed with fluid) three times daily until you start moving your bowels  ?Then you can cut back to daily or as needed (as much as it takes)  ? ?Try and keep blood sugar under control  ? ?[facility]  [phone] ? ?Please let us and GI know if symptoms suddenly worsen or change  ?Go to the ER if severe  ? ? ? ? ? ? ? ? ? ? ? ?

## 2021-08-11 NOTE — Assessment & Plan Note (Addendum)
Planning dental extractions  ?No medical restrictions to this ?No blood thinners at this time  ?

## 2021-08-12 DIAGNOSIS — H20011 Primary iridocyclitis, right eye: Secondary | ICD-10-CM | POA: Diagnosis not present

## 2021-09-01 ENCOUNTER — Telehealth: Payer: Self-pay | Admitting: *Deleted

## 2021-09-01 NOTE — Telephone Encounter (Signed)
Patient notified as instructed by telephone and verbalized understanding. Patient stated that she has called GI and is waiting on a call back from them. Patient was given ER precautions and she verbalized understanding. ?

## 2021-09-01 NOTE — Telephone Encounter (Signed)
I agree, reach out to GI.  Follow up if needed   ?ER precautions if severe abd pain or vomiting  ?

## 2021-09-01 NOTE — Telephone Encounter (Signed)
Pt called to let PCP know she was still having constipation issues and she tried what PCP recommended with the miralax and it's not helping. I do see pt's check out papers say let us and GI know if sxs don't improve. I asked pt has she f/u with GI regarding issues she did say no she doesn't even have their phone #. I gave pt [facility]'s phone # and told her to f/u with GI also and that I would also route this message to PCP as well. ?

## 2021-09-06 ENCOUNTER — Other Ambulatory Visit: Payer: Self-pay | Admitting: Internal Medicine

## 2021-09-13 ENCOUNTER — Other Ambulatory Visit: Payer: Self-pay | Admitting: Internal Medicine

## 2021-09-13 ENCOUNTER — Other Ambulatory Visit: Payer: Self-pay | Admitting: Family Medicine

## 2021-09-15 ENCOUNTER — Other Ambulatory Visit: Payer: Self-pay | Admitting: Internal Medicine

## 2021-09-18 DIAGNOSIS — R69 Illness, unspecified: Secondary | ICD-10-CM | POA: Diagnosis not present

## 2021-09-19 ENCOUNTER — Other Ambulatory Visit: Payer: Self-pay | Admitting: Internal Medicine

## 2021-09-19 MED ORDER — INSULIN PEN NEEDLE 32G X 4 MM MISC: 3 refills | Status: AC

## 2021-09-19 MED ORDER — NOVOLIN R FLEXPEN 100 UNIT/ML IJ SOPN: PEN_INJECTOR | INTRAMUSCULAR | 3 refills | Status: DC

## 2021-09-22 ENCOUNTER — Other Ambulatory Visit: Payer: Self-pay | Admitting: Family Medicine

## 2021-10-14 ENCOUNTER — Telehealth: Payer: Self-pay | Admitting: Family Medicine

## 2021-10-14 DIAGNOSIS — Z1211 Encounter for screening for malignant neoplasm of colon: Secondary | ICD-10-CM

## 2021-10-14 NOTE — Telephone Encounter (Signed)
Pt called and said that she has call the office that she was referred to for her colonoscopy and they didn't give her a a date she was told they would have call her back they give her a time frame. Pt was like to have this switch to Dr Henrene Pastor she has since in the past  he has done colonoscopy years ago. ?

## 2021-10-14 NOTE — Telephone Encounter (Signed)
Pt called and has been trying to schedule her colonoscopy, states "it would be with Dr. Henrene Pastor from what [facility] is suggesting." Wants to speak with the nurse about this. Please advise. ? ?Callback Number: [phone] ?

## 2021-10-14 NOTE — Telephone Encounter (Signed)
I placed a GI referral again  ?This time specified that she prefers Dr Henrene Pastor  ? ?Let her know that the GI office has been very busy/overwhelmed so it could take a while ? ?She can call them back later in the week  ? ?

## 2021-10-16 ENCOUNTER — Other Ambulatory Visit: Payer: Self-pay | Admitting: Internal Medicine

## 2021-10-17 ENCOUNTER — Encounter: Payer: Self-pay | Admitting: Physician Assistant

## 2021-10-17 NOTE — Telephone Encounter (Signed)
Left VM letting pt know referral sent and that she can call the office directly to set up referral

## 2021-10-28 DIAGNOSIS — G894 Chronic pain syndrome: Secondary | ICD-10-CM | POA: Diagnosis not present

## 2021-10-30 ENCOUNTER — Other Ambulatory Visit: Payer: Self-pay | Admitting: Family Medicine
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
## 2021-10-31 ENCOUNTER — Other Ambulatory Visit: Payer: Self-pay | Admitting: Internal Medicine

## 2021-10-31 ENCOUNTER — Ambulatory Visit (INDEPENDENT_AMBULATORY_CARE_PROVIDER_SITE_OTHER): Payer: BC Managed Care – PPO | Admitting: Internal Medicine

## 2021-10-31 ENCOUNTER — Encounter: Payer: Self-pay | Admitting: Internal Medicine

## 2021-10-31 VITALS — BP 130/92 | HR 73 | Ht 65.5 in | Wt 251.8 lb

## 2021-10-31 DIAGNOSIS — Z794 Long term (current) use of insulin: Secondary | ICD-10-CM

## 2021-10-31 DIAGNOSIS — E785 Hyperlipidemia, unspecified: Secondary | ICD-10-CM | POA: Diagnosis not present

## 2021-10-31 DIAGNOSIS — E559 Vitamin D deficiency, unspecified: Secondary | ICD-10-CM | POA: Diagnosis not present

## 2021-10-31 DIAGNOSIS — E1159 Type 2 diabetes mellitus with other circulatory complications: Secondary | ICD-10-CM

## 2021-10-31 LAB — POCT GLYCOSYLATED HEMOGLOBIN (HGB A1C): Hemoglobin A1C: 8.6 % — AB (ref 4.0–5.6)

## 2021-10-31 MED ORDER — FREESTYLE LIBRE 2 SENSOR MISC: 1.0000 | 3 refills | Status: DC

## 2021-10-31 MED ORDER — FREESTYLE LIBRE 2 READER DEVI: 1.0000 | Freq: Every day | 0 refills | Status: DC

## 2021-10-31 NOTE — Patient Instructions (Signed)
Please continue: - Lantus 30 units at bedtime  Please change: - Humulin R insulin:  16-18 units before b'fast  15-16 units before lunch (17-18 units if eating cereals) 12-14 units before dinner If you plan to be more active after a meal, please reduce the insulin before that meal by 5 units.  Please come back for a follow-up appointment in 4 months.

## 2021-11-03 ENCOUNTER — Other Ambulatory Visit (HOSPITAL_COMMUNITY): Payer: Self-pay

## 2021-11-03 ENCOUNTER — Telehealth: Payer: Self-pay | Admitting: Pharmacy Technician

## 2021-11-03 NOTE — Telephone Encounter (Signed)
-----   Message from Lauralyn Primes, [place] sent at 11/03/2021  8:25 AM EDT ----- Regarding: PA Can we get a PA for the [facility]. Thank you.

## 2021-11-04 ENCOUNTER — Telehealth: Payer: Self-pay

## 2021-11-04 DIAGNOSIS — Z794 Long term (current) use of insulin: Secondary | ICD-10-CM

## 2021-11-04 NOTE — Telephone Encounter (Signed)
Patient Advocate Encounter   Received notification that prior authorization for FreeStyle Libre 2 Sensor is required.   PA submitted on 11/04/2021 Key [id number]  Status is pending

## 2021-11-05 NOTE — Telephone Encounter (Signed)
[facility] Patient Advocate Encounter  Received notification that the request for prior authorization for FreeStyle Libre 2 Sensor has been denied due to there not being a clinical reason why patient can not switch to a formulary alternative. (Dexcom G6)

## 2021-11-06 NOTE — Progress Notes (Unsigned)
11/06/2021 Alexa VANDEMARK [id number] 1960/08/10  Referring provider: Abner Greenspan, MD Primary GI doctor: Dr. Henrene Pastor  ASSESSMENT AND PLAN:   There are no diagnoses linked to this encounter.  History of Present Illness:  61 y.o. female  with a past medical history of insulin-dependent type 2 diabetic, morbid obesity, fibromyalgia with chronic narcotic use following pain clinic, OSA, constipation hypothyroidism, hypertension, reflux status post cholecystectomy and others listed below, returns to clinic today for evaluation of ***. 2012 GES abnormal 39% obtained 2 hours normal less than 30 -not tolerate Reglan due to abnormal tongue sensation 2012 colonoscopy with Dr. Sharlett Iles unremarkable 07/24/2014 EGD unremarkable 06/06/2021 CT abdomen pelvis with contrast upper abdominal pain/flank pain normal liver, status post cholecystectomy no biliary dilatation, mild fat atrophy no ductal dilatation, tiny hiatal hernia, moderate colonic stool burden without thickening or diverticular changes  Patient was last seen 07/02/2021 by Dr. Henrene Pastor in the office, was to be scheduled for colonoscopy and upper endoscopy for abdominal pain, started on MiraLAX, continued PPI. Current Medications:   Current Outpatient Medications (Endocrine & Metabolic):    Insulin Regular Human (NOVOLIN R FLEXPEN RELION) 100 UNIT/ML KwikPen, Inject 12 to 18 units under skin before each meal   insulin glargine (LANTUS SOLOSTAR) 100 UNIT/ML Solostar Pen, INJECT 0.30-0.35 MLS (30-35 UNITS TOTAL) INTO THE SKIN DAILY.   levothyroxine (SYNTHROID) 88 MCG tablet, Take 1 tablet (88 mcg total) by mouth daily.  Current Outpatient Medications (Cardiovascular):    hydrochlorothiazide (HYDRODIURIL) 25 MG tablet, TAKE 1 TABLET EVERY DAY  Current Outpatient Medications (Respiratory):    albuterol (VENTOLIN HFA) 108 (90 Base) MCG/ACT inhaler, Inhale 2 puffs into the lungs every 4 (four) hours as needed for wheezing or shortness of  breath.   fluticasone (FLONASE) 50 MCG/ACT nasal spray, Place 2 sprays into both nostrils daily as needed for allergies or rhinitis.  Current Outpatient Medications (Analgesics):    oxyCODONE-acetaminophen (PERCOCET) 10-325 MG tablet, Take 1 tablet by mouth every 4 (four) hours as needed for pain. Hold for SBP < = 105   Current Outpatient Medications (Other):    Insulin Pen Needle 32G X 4 MM MISC, Use 4x a day   BD INSULIN SYRINGE U/F 31G X 5/16" 0.5 ML MISC, USE THREE TIMES PER DAY WITH R INSULIN & ONCE DAILY WITH LEVEMIR   cholecalciferol (VITAMIN D3) 25 MCG (1000 UNIT) tablet, Take 5,000 Units by mouth daily.   Continuous Blood Gluc Receiver (FREESTYLE LIBRE 2 READER) DEVI, 1 each by Does not apply route daily.   Continuous Blood Gluc Sensor (FREESTYLE LIBRE 2 SENSOR) MISC, 1 each by Does not apply route every 14 (fourteen) days.   docusate sodium (COLACE) 100 MG capsule, Take 100 mg by mouth every morning.    Lancets (ONETOUCH ULTRASOFT) lancets, Use to test blood sugar 4 times daily as instructed.   LORazepam (ATIVAN) 2 MG tablet, Take 2 mg by mouth 2 (two) times daily.   naloxone (NARCAN) 4 MG/0.1ML LIQD nasal spray kit,    ONETOUCH ULTRA test strip, USE TO TEST BLOOD SUGAR 4 TIMES DAILY AS INSTRUCTED.   pantoprazole (PROTONIX) 40 MG tablet, TAKE 1 TABLET TWICE DAILY   potassium chloride (KLOR-CON M) 10 MEQ tablet, TAKE 1 TABLET EVERY DAY   sertraline (ZOLOFT) 100 MG tablet, Take 200 mg by mouth every morning.   tiZANidine (ZANAFLEX) 4 MG tablet, Take 4 mg by mouth 2 (two) times daily as needed for muscle spasms.    triamcinolone cream (KENALOG) 0.1 %, Apply  1 application topically 2 (two) times daily. To psoriasis areas   zolpidem (AMBIEN) 10 MG tablet, Take 10 mg by mouth at bedtime as needed for sleep.  Surgical History:  She  has a past surgical history that includes Knee arthroscopy w/ partial medial meniscectomy (Right, 10/2008); Total knee arthroplasty (Right, 07-01-2009  dr  Wynelle Link   [facility]); Esophagogastroduodenoscopy (07/2002); Dobutamine stress echo (2009); Elbow surgery (Bilateral, RIGHT 2013;  LEFT 2016); Vaginal hysterectomy (2003); Knee arthroscopy (Left, 11/ 2018   dr Wynelle Link  @ [facility]); Shoulder arthroscopy with distal clavicle resection (Left, 12/ 2011   dr dean); Cholecystectomy open (1990); Total knee arthroplasty (Left, 12/06/2017); and eye sugery (Bilateral). Family History:  Her family history includes Alcohol abuse in her mother; Celiac disease in her daughter; Diabetes in her brother, brother, brother, brother, mother, sister, sister, sister, and sister; Esophageal cancer in her maternal grandmother; Heart attack in her brother and sister; Heart attack (age of onset: 16) in her brother; Heart failure in her mother; Hypertension in her mother; Lung cancer in an other family member; Parkinson's disease in her brother; Thyroid disease in her niece. Social History:   reports that she quit smoking about 19 years ago. Her smoking use included cigarettes. She has never used smokeless tobacco. She reports that she does not drink alcohol and does not use drugs.  Current Medications, Allergies, Past Medical History, Past Surgical History, Family History and Social History were reviewed in [employer] record.  Physical Exam: There were no vitals taken for this visit. General:   Pleasant, well developed female in no acute distress Heart : Regular rate and rhythm; no murmurs Pulm: Clear anteriorly; no wheezing Abdomen:  {BlankSingle:19197::"Distended","Ridged","Soft"}, {BlankSingle:19197::"Flat","Obese","Non-distended"} AB, {BlankSingle:19197::"Absent","Hyperactive, tinkling","Hypoactive","Sluggish","Active"} bowel sounds. {actendernessAB:27319} tenderness {anatomy; site abdomen:5010}. {BlankMultiple:19196::"Without guarding","With guarding","Without rebound","With rebound"}, No organomegaly appreciated. Rectal: {acrectalexam:27461} Extremities:   {With/without:5700}  edema. Neurologic:  Alert and  oriented x4;  No focal deficits.  Psych:  Cooperative. Normal mood and affect.   Vladimir Crofts, PA-C 11/06/21

## 2021-11-07 MED ORDER — DEXCOM G6 SENSOR MISC: 3 refills | Status: DC

## 2021-11-07 MED ORDER — DEXCOM G6 RECEIVER DEVI: 0 refills | Status: DC

## 2021-11-07 MED ORDER — DEXCOM G6 TRANSMITTER MISC: 3 refills | Status: DC

## 2021-11-07 NOTE — Telephone Encounter (Signed)
Rx for Dexcom sent to preferred pharmacy. 

## 2021-11-07 NOTE — Addendum Note (Signed)
Addended by: Lauralyn Primes on: 11/07/2021 12:26 PM   Modules accepted: Orders

## 2021-11-10 ENCOUNTER — Other Ambulatory Visit: Payer: Self-pay | Admitting: Physician Assistant

## 2021-11-10 ENCOUNTER — Ambulatory Visit (INDEPENDENT_AMBULATORY_CARE_PROVIDER_SITE_OTHER): Payer: BC Managed Care – PPO | Admitting: Physician Assistant

## 2021-11-10 ENCOUNTER — Encounter: Payer: Self-pay | Admitting: Physician Assistant

## 2021-11-10 VITALS — BP 128/78 | HR 68 | Wt 253.2 lb

## 2021-11-10 DIAGNOSIS — Z794 Long term (current) use of insulin: Secondary | ICD-10-CM

## 2021-11-10 DIAGNOSIS — K5903 Drug induced constipation: Secondary | ICD-10-CM | POA: Diagnosis not present

## 2021-11-10 DIAGNOSIS — Z1211 Encounter for screening for malignant neoplasm of colon: Secondary | ICD-10-CM

## 2021-11-10 DIAGNOSIS — E119 Type 2 diabetes mellitus without complications: Secondary | ICD-10-CM

## 2021-11-10 DIAGNOSIS — R1319 Other dysphagia: Secondary | ICD-10-CM

## 2021-11-10 DIAGNOSIS — K3184 Gastroparesis: Secondary | ICD-10-CM

## 2021-11-10 DIAGNOSIS — E1143 Type 2 diabetes mellitus with diabetic autonomic (poly)neuropathy: Secondary | ICD-10-CM | POA: Diagnosis not present

## 2021-11-10 DIAGNOSIS — K219 Gastro-esophageal reflux disease without esophagitis: Secondary | ICD-10-CM | POA: Diagnosis not present

## 2021-11-10 MED ORDER — PLENVU 140 G PO SOLR: 1.0000 | ORAL | 0 refills | Status: DC

## 2021-11-10 MED ORDER — LUBIPROSTONE 24 MCG PO CAPS: 24.0000 ug | ORAL_CAPSULE | Freq: Two times a day (BID) | ORAL | 0 refills | Status: DC

## 2021-11-10 MED ORDER — NA SULFATE-K SULFATE-MG SULF 17.5-3.13-1.6 GM/177ML PO SOLN: 1.0000 | Freq: Once | ORAL | 0 refills | Status: AC

## 2021-11-10 NOTE — Progress Notes (Signed)
Assessment and plans reviewed  

## 2021-11-10 NOTE — Patient Instructions (Addendum)
If you are age 59 or younger, your body mass index should be between 19-25. Your Body mass index is 41.49 kg/m. If this is out of the aformentioned range listed, please consider follow up with your Primary Care Provider.  ________________________________________________________  The [facility] providers would like to encourage you to use [facility] to communicate with providers for non-urgent requests or questions.  Due to long hold times on the telephone, sending your provider a message by [facility] may be a faster and more efficient way to get a response.  Please allow 48 business hours for a response.  Please remember that this is for non-urgent requests.   You have been scheduled for an endoscopy and colonoscopy. Please follow the written instructions given to you at your visit today. Please pick up your prep supplies at the pharmacy within the next 1-3 days. If you use inhalers (even only as needed), please bring them with you on the day of your procedure.  Follow up pending  Thank you for entrusting me with your care and choosing [facility].  Vicie Mutters, PA-C   Gastroparesis  Please do small frequent meals like 4-6 meals a day.   Eat and drink liquids at separate times.   Avoid high fiber foods, cook your vegetables, avoid high fat food.   Suggest spreading protein throughout the day (greek yogurt, glucerna, soft meat, milk, eggs)  Choose soft foods that you can mash with a fork  When you are more symptomatic, change to pureed foods foods and liquids.   Consider reading "Living well with Gastroparesis" by Lambert Keto Gastroparesis is a condition in which food takes longer than normal to empty from the stomach. This condition is also known as delayed gastric emptying. It is usually a long-term (chronic) condition. There is no cure, but there are treatments and things that you can do at home to help relieve symptoms. Treating the underlying condition that causes  gastroparesis can also help relieve symptoms.  Can refer to dietician if you want!  Can try amitiza twice a day 24 mcg for constipation,if this is not covered can try lactulose  Recommend starting on a fiber supplement, can try metamucil first but if this causes gas/bloating switch to benefiber Take with fiber with with a full 8 oz glass of water once a day. This can take 1 month to start helping, so try for at least one month.  Recommend increasing water and activity.  Get a squatty potty to use at home or try a stool, goal is to get your knees above your hips during a bowel movement.  - Drink at least 64-80 ounces of water/liquid per day. - Establish a time to try to move your bowels every day.  For many people, this is after a cup of coffee or after a meal such as breakfast. - Sit all of the way back on the toilet keeping your back fairly straight and while sitting up, try to rest the tops of your forearms on your upper thighs.   - Raising your feet with a step stool/squatty potty can be helpful to improve the angle that allows your stool to pass through the rectum. - Relax the rectum feeling it bulge toward the toilet water.  If you feel your rectum raising toward your body, you are contracting rather than relaxing. - Breathe in and slowly exhale. "Belly breath" by expanding your belly towards your belly button. Keep belly expanded as you gently direct pressure down and back to  the anus.  A low pitched GRRR sound can assist with increasing intra-abdominal pressure.  - Repeat 3-4 times. If unsuccessful, contract the pelvic floor to restore normal tone and get off the toilet.  Avoid excessive straining. - To reduce excessive wiping by teaching your anus to normally contract, place hands on outer aspect of knees and resist knee movement outward.  Hold 5-10 second then place hands just inside of knees and resist inward movement of knees.  Hold 5 seconds.  Repeat a few times each way.  Go to the  ER if unable to pass gas, severe AB pain, unable to hold down food, any shortness of breath of chest pain.   Constipation, Adult Constipation is when a person has fewer than three bowel movements in a week, has difficulty having a bowel movement, or has stools (feces) that are dry, hard, or larger than normal. Constipation may be caused by an underlying condition. It may become worse with age if a person takes certain medicines and does not take in enough fluids. Follow these instructions at home: Eating and drinking  Eat foods that have a lot of fiber, such as beans, whole grains, and fresh fruits and vegetables. Limit foods that are low in fiber and high in fat and processed sugars, such as fried or sweet foods. These include french fries, hamburgers, cookies, candies, and soda. Drink enough fluid to keep your urine pale yellow. General instructions Exercise regularly or as told by your health care provider. Try to do 150 minutes of moderate exercise each week. Use the bathroom when you have the urge to go. Do not hold it in. Take over-the-counter and prescription medicines only as told by your health care provider. This includes any fiber supplements. During bowel movements: Practice deep breathing while relaxing the lower abdomen. Practice pelvic floor relaxation. Watch your condition for any changes. Let your health care provider know about them. Keep all follow-up visits as told by your health care provider. This is important. Contact a health care provider if: You have pain that gets worse. You have a fever. You do not have a bowel movement after 4 days. You vomit. You are not hungry or you lose weight. You are bleeding from the opening between the buttocks (anus). You have thin, pencil-like stools. Get help right away if: You have a fever and your symptoms suddenly get worse. You leak stool or have blood in your stool. Your abdomen is bloated. You have severe pain in your  abdomen. You feel dizzy or you faint. Summary Constipation is when a person has fewer than three bowel movements in a week, has difficulty having a bowel movement, or has stools (feces) that are dry, hard, or larger than normal. Eat foods that have a lot of fiber, such as beans, whole grains, and fresh fruits and vegetables. Drink enough fluid to keep your urine pale yellow. Take over-the-counter and prescription medicines only as told by your health care provider. This includes any fiber supplements. This information is not intended to replace advice given to you by your health care provider. Make sure you discuss any questions you have with your health care provider. Document Revised: 04/05/2019 Document Reviewed: 04/05/2019 Elsevier Patient Education  2022 [employer].    Optician, dispensing  The [employer] Program provides rides for seniors age 2 and over who are ambulatory to medical appointments in [place] and to regional medical facilities. Individuals must be ambulatory to receive rides through  the [employer].  Volunteer drivers from the faith community provide the rides to scheduled medical appointments.  Contact the SeniorLine From [place]: [phone]  From [place]: [phone] [email or web address]  https://www.senior-resources-[email or web address]/senior-wheels-medical-transportation-program  HomeFares & Passes HALF FARE with [employer] Qualify for Half Fare PART provides a half-price discount to Seniors (60 plus), Disabled, Students, [employer], and [employer]. Seniors: Age 20+ with proper identification (driver's license, transit Economist)  Disabled: With disabled identification issued from [place] transit agency (We do not accept [employer])  Veterans Discount: With U.S. Department of Defense and [employer] retired Programmer, multimedia card,  county USAA identification card, or [employer] with "[employer]" label.  Medicare: With valid medicare identification or transit Issued medicare identification    In order to get reduced fare when using [employer], you must be pre-registered in the system.  It is easy to become registered for your discount, simply fill out this form or visit the [facility] ([facility]) ticket window and we will designate you as a discount fare passenger in the [place] system.  You won't need to show ID when boarding the bus.  Passes purchased through [place] prior to half fare registration/designation will NOT be refunded.  NuclearSuits.de  Non-Emergency Medical Transportation (TAMS)  TAMS provides transportation to doctors' offices, hospitals, dialysis centers, clinics, dentists and other health-related visits medical appoints for persons in need.  [facility] residents who have a Medicaid pink or blue card and who have no other means of transportation receive free transportation to medical appointment.  Transportation services may include bus tickets, gas vouchers, taxis or vans.  Medicaid recipients must complete a transportation assessment form before service can begin.  This is usually handled over the phone.  Other county residents living outside the city limits or without access to public transit can also receive medical transportation. Non-Medicaid passengers must also complete an eligibility form. Persons 36 years old or older will be mailed and form.  For all other the form can be downloaded below. Persons over 25 years of age may receive service for free, all others pay a $2.50 one-way fare.  Download form FOR NON_MEDICAID RECEIPTANTS AND PERSON 63 YEARS OLD OR YOUNGER ONLY. ALL OTHERS MUST CALL [phone].   [email or web address]

## 2021-11-13 ENCOUNTER — Other Ambulatory Visit (HOSPITAL_COMMUNITY): Payer: Self-pay

## 2021-11-13 ENCOUNTER — Telehealth: Payer: Self-pay

## 2021-11-13 MED ORDER — NALOXEGOL OXALATE 25 MG PO TABS: 25.0000 mg | ORAL_TABLET | Freq: Every day | ORAL | 3 refills | Status: DC

## 2021-11-13 NOTE — Telephone Encounter (Signed)
Movantik 25 mg has been sent to patient's pharmacy. Left detailed voicemail to let patient know about the medication change.

## 2021-11-13 NOTE — Telephone Encounter (Signed)
Received fax from patient's pharmacy that insurance will not cover Amitiza/Lubiprostone, but will cover Movantik or Lactulose. Which would you like to switch to?

## 2021-11-13 NOTE — Telephone Encounter (Signed)
Not sure what happened with this. But Amitiza is covered on the patient's plan with a copay of $5. PA not needed. PA is needed with Movantik. Please advise if you would like me to process a PA for Movantik.

## 2021-11-13 NOTE — Telephone Encounter (Signed)
Let's keep her on Amitiza

## 2021-11-17 ENCOUNTER — Other Ambulatory Visit (HOSPITAL_COMMUNITY): Payer: Self-pay

## 2021-11-18 ENCOUNTER — Telehealth: Payer: Self-pay | Admitting: Pharmacy Technician

## 2021-11-18 ENCOUNTER — Other Ambulatory Visit (HOSPITAL_COMMUNITY): Payer: Self-pay

## 2021-11-18 DIAGNOSIS — M65332 Trigger finger, left middle finger: Secondary | ICD-10-CM | POA: Diagnosis not present

## 2021-11-18 NOTE — Telephone Encounter (Signed)
Patient Research scientist (life sciences) completed.

## 2021-11-23 ENCOUNTER — Other Ambulatory Visit (HOSPITAL_COMMUNITY): Payer: Self-pay

## 2021-11-25 ENCOUNTER — Other Ambulatory Visit: Payer: Self-pay | Admitting: Internal Medicine

## 2021-12-11 ENCOUNTER — Telehealth: Payer: Self-pay | Admitting: Physician Assistant

## 2021-12-11 NOTE — Telephone Encounter (Signed)
Please see question below. Her chart shows that she is on Oxycodone/Ace. Let me know how she needs to proceed. Thanks

## 2021-12-11 NOTE — Telephone Encounter (Signed)
Inbound call from patient stating that she is scheduled for a endo and colon procedure on 7/18 with Dr. Henrene Pastor. Patient is seeking advice if she will be able to take her pain medication for her back. Please advise.

## 2021-12-12 NOTE — Telephone Encounter (Signed)
Called and spoke with patient and advised she is okay to take her pain medication at least 3 hours prior to procedure. I also went over her clear liquids with her to answer her questions.

## 2021-12-12 NOTE — Telephone Encounter (Signed)
Alexa Woods,  She can take her pain meds up to 3 hrs before the procedure with a sip of water.  Thanks,  Osvaldo Angst

## 2021-12-16 ENCOUNTER — Ambulatory Visit (AMBULATORY_SURGERY_CENTER): Payer: BC Managed Care – PPO | Admitting: Internal Medicine

## 2021-12-16 ENCOUNTER — Encounter: Payer: Self-pay | Admitting: Internal Medicine

## 2021-12-16 VITALS — BP 133/75 | HR 66 | Temp 96.8°F | Resp 13 | Ht 66.0 in | Wt 253.0 lb

## 2021-12-16 DIAGNOSIS — D123 Benign neoplasm of transverse colon: Secondary | ICD-10-CM | POA: Diagnosis not present

## 2021-12-16 DIAGNOSIS — K3184 Gastroparesis: Secondary | ICD-10-CM

## 2021-12-16 DIAGNOSIS — Z1211 Encounter for screening for malignant neoplasm of colon: Secondary | ICD-10-CM

## 2021-12-16 DIAGNOSIS — K219 Gastro-esophageal reflux disease without esophagitis: Secondary | ICD-10-CM

## 2021-12-16 DIAGNOSIS — D124 Benign neoplasm of descending colon: Secondary | ICD-10-CM

## 2021-12-16 DIAGNOSIS — R1310 Dysphagia, unspecified: Secondary | ICD-10-CM | POA: Diagnosis not present

## 2021-12-16 DIAGNOSIS — K449 Diaphragmatic hernia without obstruction or gangrene: Secondary | ICD-10-CM | POA: Diagnosis not present

## 2021-12-16 MED ORDER — SODIUM CHLORIDE 0.9 % IV SOLN: 500.0000 mL | INTRAVENOUS | Status: DC

## 2021-12-16 NOTE — Op Note (Signed)
Alexa Woods Patient Name: Alexa Woods Procedure Date: 12/16/2021 1:06 PM MRN: [id number] Endoscopist: Docia Chuck. Alexa Woods , MD Age: 61 Referring MD:  Date of Birth: 06-Oct-1960 Gender: Female Account #: [id number] Procedure:                Upper GI endoscopy Indications:              Dysphagia (globus sensation), Esophageal reflux Medicines:                Monitored Anesthesia Care Procedure:                Pre-Anesthesia Assessment:                           - Prior to the procedure, a History and Physical                            was performed, and patient medications and                            allergies were reviewed. The patient's tolerance of                            previous anesthesia was also reviewed. The risks                            and benefits of the procedure and the sedation                            options and risks were discussed with the patient.                            All questions were answered, and informed consent                            was obtained. Prior Anticoagulants: The patient has                            taken no previous anticoagulant or antiplatelet                            agents. ASA Grade Assessment: III - A patient with                            severe systemic disease. After reviewing the risks                            and benefits, the patient was deemed in                            satisfactory condition to undergo the procedure.                           After obtaining informed consent, the endoscope was  passed under direct vision. Throughout the                            procedure, the patient's blood pressure, pulse, and                            oxygen saturations were monitored continuously. The                            GIF [id number] #[id number] was introduced through the                            mouth, and advanced to the second part of duodenum.                            The  upper GI endoscopy was accomplished without                            difficulty. The patient tolerated the procedure                            well. Scope In: Scope Out: Findings:                 The esophagus was normal.                           The stomach was normal. Small hiatal hernia.                           The examined duodenum was normal.                           The cardia and gastric fundus were normal on                            retroflexion. Complications:            No immediate complications. Estimated Blood Loss:     Estimated blood loss: none. Impression:               - Normal esophagus.                           - Normal stomach.                           - Normal examined duodenum.                           - No specimens collected. Recommendation:           - Patient has a contact number available for                            emergencies. The signs and symptoms of potential  delayed complications were discussed with the                            patient. Return to normal activities tomorrow.                            Written discharge instructions were provided to the                            patient.                           - Resume previous diet.                           - Continue present medications. Docia Chuck. Alexa Pastor, MD 12/16/2021 2:19:59 PM This report has been signed electronically.

## 2021-12-16 NOTE — Progress Notes (Signed)
Pt's states no medical or surgical changes since previsit or office visit. 

## 2021-12-16 NOTE — Progress Notes (Signed)
11/10/2021 Alexa Woods [id number] 03-Jun-1960   Referring provider: Abner Greenspan, MD Primary GI doctor: Dr. Henrene Pastor   ASSESSMENT AND PLAN:    GERD without esophagitis, some dysphagia with pills/food Will plan for EGD If negative most likely component of gastroparesis Unable to tolerate reglan Discussed diet with small, frequent meals, soft diet with the patient.  Can refer to dietician and encouraged weight loss with better control of A1C if she thinks she can get transportation, given information Due to age, co morbidities, did not offer metoclopramide trial. , Will need to optimize bowel regimen. , and Can consider trial of motegrity.    Drug-induced constipation -     lubiprostone (AMITIZA) 24 MCG capsule; Take 1 capsule (24 mcg total) by mouth 2 (two) times daily with a meal. Did not respond to miralax, fiber, senokot, will do trial of amitiza, if this not covered can do trial of lactulose   Insulin dependent type 2 diabetes mellitus ([place]) Encouraged to get better control of sugars.    Gastroparesis due to DM ([place]) With oxycodone use will be difficult to treat Discussed diet with small, frequent meals, soft diet with the patient.  Can refer to dietician if she calls back, given information about rides May benefit from referral to [facility] for motility specialist for discussion of long term options., Due to age, co morbidities, did not offer metoclopramide trial. , and Will need to optimize bowel regimen.    Esophageal dysphagia EGD to evaluate for structural abnormality, tumor, erosive/infectious esophagititis, and EOE.   If the EGD is negative can then proceed to barium swallow and/or esophageal manometry. I discussed risks of EGD with patient today, including risk of sedation, bleeding or perforation.  Patient provides understanding and gave verbal consent to proceed.   Screen for colon cancer We have discussed the risks of bleeding, infection, perforation, medication  reactions, and remote risk of death associated with colonoscopy. All questions were answered and the patient acknowledges these risk and wishes to proceed.       History of Present Illness:  61 y.o. female  with a past medical history of insulin-dependent type 2 diabetic, morbid obesity, fibromyalgia with chronic narcotic use following pain clinic, OSA, constipation hypothyroidism, hypertension, reflux status post cholecystectomy and others listed below, returns to clinic today to discuss EGD/Colon.    2012 GES abnormal 39% obtained 2 hours normal less than 30 -not tolerate Reglan due to abnormal tongue sensation 2012 colonoscopy with Dr. Sharlett Iles unremarkable 07/24/2014 EGD unremarkable 06/06/2021 CT abdomen pelvis with contrast upper abdominal pain/flank pain normal liver, status post cholecystectomy no biliary dilatation, mild fat atrophy no ductal dilatation, tiny hiatal hernia, moderate colonic stool burden without thickening or diverticular changes 2012 Colonoscopy Dr. Sharlett Iles normal   Patient was last seen 07/02/2021 by Dr. Henrene Pastor in the office, was to be scheduled for colonoscopy and upper endoscopy for abdominal pain, started on MiraLAX, continued PPI. Recent Labs       Lab Results  Component Value Date    HGBA1C 8.6 (A) 10/31/2021      She has constipation, will feel a knot in her epigastric are, will have epigastric pain. Has esophageal dysphagia and with pills.  She has some nausea, rare vomiting.  She just had her teeth pulled.  Has more symptoms with meats.  Has BM every 2-3 days and when she does have BM very hard, large stools. Has tried miralax which did not help, took for 1 month 3 x a day. She  has tried suppositories, metamucil without help.  She is on oxycodone 3 x a day for years for back pain/knee pain.  No blood in stool, no melena.  Had salmonella early this year.    CBC  06/05/2021  HGB 12.7 MCV 85.7 without evidence of anemia WBC 8.0 Platelets 253 Anemia  panel 01/29/2021  Iron 47 Ferritin No results found for requested labs within last 1095 days.  11/06/2020  B12 425 Kidney function 06/05/2021  BUN 17 Cr 0.88  GFR >60  Potassium 3.8   LFTs 06/05/2021  AST 35 ALT 32 Alkphos 190 TBili 0.4 06/05/2021 LIPASE 27 12/19/2020 TSH 3.97     Current Medications:    Current Outpatient Medications (Endocrine & Metabolic):    insulin glargine (LANTUS SOLOSTAR) 100 UNIT/ML Solostar Pen, INJECT 0.30-0.35 MLS (30-35 UNITS TOTAL) INTO THE SKIN DAILY.   Insulin Regular Human (NOVOLIN R FLEXPEN RELION) 100 UNIT/ML KwikPen, Inject 12 to 18 units under skin before each meal   levothyroxine (SYNTHROID) 88 MCG tablet, Take 1 tablet (88 mcg total) by mouth daily.   Current Outpatient Medications (Cardiovascular):    hydrochlorothiazide (HYDRODIURIL) 25 MG tablet, TAKE 1 TABLET EVERY DAY   Current Outpatient Medications (Respiratory):    albuterol (VENTOLIN HFA) 108 (90 Base) MCG/ACT inhaler, Inhale 2 puffs into the lungs every 4 (four) hours as needed for wheezing or shortness of breath.   fluticasone (FLONASE) 50 MCG/ACT nasal spray, Place 2 sprays into both nostrils daily as needed for allergies or rhinitis.   Current Outpatient Medications (Analgesics):    oxyCODONE-acetaminophen (PERCOCET) 10-325 MG tablet, Take 1 tablet by mouth every 4 (four) hours as needed for pain. Hold for SBP < = 105     Current Outpatient Medications (Other):    BD INSULIN SYRINGE U/F 31G X 5/16" 0.5 ML MISC, USE THREE TIMES PER DAY WITH R INSULIN & ONCE DAILY WITH LEVEMIR   cholecalciferol (VITAMIN D3) 25 MCG (1000 UNIT) tablet, Take 5,000 Units by mouth daily.   Continuous Blood Gluc Receiver ([place]) DEVI, Use as directed to check blood sugar.   Continuous Blood Gluc Sensor (DEXCOM G6 SENSOR) MISC, Use as directed to check blood sugar. change every 10 days   Continuous Blood Gluc Transmit (DEXCOM G6 TRANSMITTER) MISC, Use as directed to check blood sugar. Change every 90  days   docusate sodium (COLACE) 100 MG capsule, Take 100 mg by mouth every morning.    Insulin Pen Needle 32G X 4 MM MISC, Use 4x a day   Lancets (ONETOUCH ULTRASOFT) lancets, Use to test blood sugar 4 times daily as instructed.   LORazepam (ATIVAN) 2 MG tablet, Take 2 mg by mouth 2 (two) times daily.   lubiprostone (AMITIZA) 24 MCG capsule, Take 1 capsule (24 mcg total) by mouth 2 (two) times daily with a meal.   naloxone (NARCAN) 4 MG/0.1ML LIQD nasal spray kit,    ONETOUCH ULTRA test strip, USE TO TEST BLOOD SUGAR 4 TIMES DAILY AS INSTRUCTED.   pantoprazole (PROTONIX) 40 MG tablet, TAKE 1 TABLET TWICE DAILY   potassium chloride (KLOR-CON M) 10 MEQ tablet, TAKE 1 TABLET EVERY DAY   sertraline (ZOLOFT) 100 MG tablet, Take 200 mg by mouth every morning.   tiZANidine (ZANAFLEX) 4 MG tablet, Take 4 mg by mouth 2 (two) times daily as needed for muscle spasms.    triamcinolone cream (KENALOG) 0.1 %, Apply 1 application topically 2 (two) times daily. To psoriasis areas   zolpidem (AMBIEN) 10 MG tablet, Take  10 mg by mouth at bedtime as needed for sleep.   Surgical History:  She  has a past surgical history that includes Knee arthroscopy w/ partial medial meniscectomy (Right, 10/2008); Total knee arthroplasty (Right, 07-01-2009  dr Wynelle Link   [facility]); Esophagogastroduodenoscopy (07/2002); Dobutamine stress echo (2009); Elbow surgery (Bilateral, RIGHT 2013;  LEFT 2016); Vaginal hysterectomy (2003); Knee arthroscopy (Left, 11/ 2018   dr Wynelle Link  @ [facility]); Shoulder arthroscopy with distal clavicle resection (Left, 12/ 2011   dr dean); Cholecystectomy open (1990); Total knee arthroplasty (Left, 12/06/2017); and eye sugery (Bilateral). Family History:  Her family history includes Alcohol abuse in her mother; Celiac disease in her daughter; Diabetes in her brother, brother, brother, brother, mother, sister, sister, sister, and sister; Esophageal cancer in her maternal grandmother; Heart attack in her brother and  sister; Heart attack (age of onset: 75) in her brother; Heart failure in her mother; Hypertension in her mother; Lung cancer in an other family member; Parkinson's disease in her brother; Thyroid disease in her niece. Social History:   reports that she quit smoking about 19 years ago. Her smoking use included cigarettes. She has never used smokeless tobacco. She reports that she does not drink alcohol and does not use drugs.   Current Medications, Allergies, Past Medical History, Past Surgical History, Family History and Social History were reviewed in [employer] record.   Physical Exam: BP 128/78   Pulse 68   Wt 253 lb 3.2 oz (114.9 kg)   BMI 41.49 kg/m  General:   Pleasant, well developed female in no acute distress Heart : Regular rate and rhythm; no murmurs Pulm: Clear anteriorly; no wheezing Abdomen:  Soft, Obese AB, Active bowel sounds. mild tenderness in the epigastrium. Without guarding and Without rebound, No organomegaly appreciated. Rectal: Not evaluated Extremities:  with  edema. Neurologic:  Alert and  oriented x4;  No focal deficits.  Psych:  Cooperative. Normal mood and affect.     Vladimir Crofts, PA-C 11/10/21

## 2021-12-16 NOTE — Progress Notes (Signed)
Pt in recovery with monitors in place, VSS. Report given to receiving RN. Bite guard was placed with pt awake to ensure comfort. No dental or soft tissue damage noted. 

## 2021-12-16 NOTE — Op Note (Signed)
[facility] Patient Name: Alexa Woods Procedure Date: 12/16/2021 1:07 PM MRN: [id number] Endoscopist: Docia Chuck. Henrene Pastor , MD Age: 61 Referring MD:  Date of Birth: 1961-05-16 Gender: Female Account #: [id number] Procedure:                Colonoscopy with cold snare polypectomy x 2 Indications:              Screening for colorectal malignant neoplasm.                            Negative index exam 2012 (Dr. Sharlett Iles) Medicines:                Monitored Anesthesia Care Procedure:                Pre-Anesthesia Assessment:                           - Prior to the procedure, a History and Physical                            was performed, and patient medications and                            allergies were reviewed. The patient's tolerance of                            previous anesthesia was also reviewed. The risks                            and benefits of the procedure and the sedation                            options and risks were discussed with the patient.                            All questions were answered, and informed consent                            was obtained. Prior Anticoagulants: The patient has                            taken no previous anticoagulant or antiplatelet                            agents. ASA Grade Assessment: III - A patient with                            severe systemic disease. After reviewing the risks                            and benefits, the patient was deemed in                            satisfactory condition to undergo the procedure.  After obtaining informed consent, the colonoscope                            was passed under direct vision. Throughout the                            procedure, the patient's blood pressure, pulse, and                            oxygen saturations were monitored continuously. The                            Olympus CF-HQ190L [id number]) Colonoscope was                             introduced through the anus and advanced to the the                            cecum, identified by appendiceal orifice and                            ileocecal valve. The ileocecal valve, appendiceal                            orifice, and rectum were photographed. The quality                            of the bowel preparation was excellent. The                            colonoscopy was performed without difficulty. The                            patient tolerated the procedure well. The bowel                            preparation used was SUPREP via split dose                            instruction. Scope In: 1:46:24 PM Scope Out: 2:01:57 PM Scope Withdrawal Time: 0 hours 12 minutes 28 seconds  Total Procedure Duration: 0 hours 15 minutes 33 seconds  Findings:                 Two polyps were found in the descending colon and                            transverse colon. The polyps were 2 to 4 mm in                            size. These polyps were removed with a cold snare.                            Resection and retrieval were complete.  Internal hemorrhoids were found during                            retroflexion. The hemorrhoids were small.                           There was a stenotic fixed rectosigmoid region that                            would not permit the passage of the standard adult                            scope. The area was easily navigated with the                            ultraslim pediatric colonoscope. The exam was                            otherwise without abnormality on direct and                            retroflexion views. Complications:            No immediate complications. Estimated blood loss:                            None. Estimated Blood Loss:     Estimated blood loss: none. Impression:               - Two 2 to 4 mm polyps in the descending colon and                            in the transverse colon, removed  with a cold snare.                            Resected and retrieved.                           - Internal hemorrhoids.                           - Rectosigmoid stenosis requiring ultraslim                            pediatric scope to navigate                           - The examination was otherwise normal on direct                            and retroflexion views. Recommendation:           - Repeat colonoscopy in 10 years for surveillance.                           - Patient has a contact number available for  emergencies. The signs and symptoms of potential                            delayed complications were discussed with the                            patient. Return to normal activities tomorrow.                            Written discharge instructions were provided to the                            patient.                           - Resume previous diet.                           - Continue present medications. Docia Chuck. Henrene Pastor, MD 12/16/2021 2:17:36 PM This report has been signed electronically.

## 2021-12-16 NOTE — Progress Notes (Signed)
Called to room to assist during endoscopic procedure.  Patient ID and intended procedure confirmed with present staff. Received instructions for my participation in the procedure from the performing physician.  

## 2021-12-16 NOTE — Patient Instructions (Signed)
Handouts provided about hemorrhoids and polyps.  Repeat colonoscopy in 10 years for surveillance.  Resume previous diet.  Continue present medications.     YOU HAD AN ENDOSCOPIC PROCEDURE TODAY AT [facility]:   Refer to the procedure report that was given to you for any specific questions about what was found during the examination.  If the procedure report does not answer your questions, please call your gastroenterologist to clarify.  If you requested that your care partner not be given the details of your procedure findings, then the procedure report has been included in a sealed envelope for you to review at your convenience later.  YOU SHOULD EXPECT: Some feelings of bloating in the abdomen. Passage of more gas than usual.  Walking can help get rid of the air that was put into your GI tract during the procedure and reduce the bloating. If you had a lower endoscopy (such as a colonoscopy or flexible sigmoidoscopy) you may notice spotting of blood in your stool or on the toilet paper. If you underwent a bowel prep for your procedure, you may not have a normal bowel movement for a few days.  Please Note:  You might notice some irritation and congestion in your nose or some drainage.  This is from the oxygen used during your procedure.  There is no need for concern and it should clear up in a day or so.  SYMPTOMS TO REPORT IMMEDIATELY:  Following lower endoscopy (colonoscopy or flexible sigmoidoscopy):  Excessive amounts of blood in the stool  Significant tenderness or worsening of abdominal pains  Swelling of the abdomen that is new, acute  Fever of 100F or higher  Following upper endoscopy (EGD)  Vomiting of blood or coffee ground material  New chest pain or pain under the shoulder blades  Painful or persistently difficult swallowing  New shortness of breath  Fever of 100F or higher  Black, tarry-looking stools  For urgent or emergent issues, a gastroenterologist can  be reached at any hour by calling [phone]. Do not use MyChart messaging for urgent concerns.    DIET:  We do recommend a small meal at first, but then you may proceed to your regular diet.  Drink plenty of fluids but you should avoid alcoholic beverages for 24 hours.  ACTIVITY:  You should plan to take it easy for the rest of today and you should NOT DRIVE or use heavy machinery until tomorrow (because of the sedation medicines used during the test).    FOLLOW UP: Our staff will call the number listed on your records the next business day following your procedure.  We will call around 7:15- 8:00 am to check on you and address any questions or concerns that you may have regarding the information given to you following your procedure. If we do not reach you, we will leave a message.  If you develop any symptoms (ie: fever, flu-like symptoms, shortness of breath, cough etc.) before then, please call [phone].  If you test positive for Covid 19 in the 2 weeks post procedure, please call and report this information to [place].    If any biopsies were taken you will be contacted by phone or by letter within the next 1-3 weeks.  Please call us at [phone] if you have not heard about the biopsies in 3 weeks.    SIGNATURES/CONFIDENTIALITY: You and/or your care partner have signed paperwork which will be entered into your electronic medical record.  These signatures  attest to the fact that that the information above on your After Visit Summary has been reviewed and is understood.  Full responsibility of the confidentiality of this discharge information lies with you and/or your care-partner.

## 2021-12-17 ENCOUNTER — Telehealth: Payer: Self-pay

## 2021-12-17 NOTE — Telephone Encounter (Signed)
No answer, left message to call if having any issues or concerns, B.Jeray Shugart RN 

## 2021-12-24 ENCOUNTER — Encounter: Payer: Self-pay | Admitting: Internal Medicine

## 2022-01-09 ENCOUNTER — Telehealth: Payer: Self-pay

## 2022-01-09 ENCOUNTER — Other Ambulatory Visit (HOSPITAL_COMMUNITY): Payer: Self-pay

## 2022-01-09 NOTE — Telephone Encounter (Signed)
Patient states insurance will not cover Lantus. Can we find out what is covered

## 2022-01-12 MED ORDER — BASAGLAR KWIKPEN 100 UNIT/ML ~~LOC~~ SOPN: PEN_INJECTOR | SUBCUTANEOUS | 3 refills | Status: DC

## 2022-01-12 NOTE — Telephone Encounter (Signed)
LMTCB to advise that medication has been changed to [employer]

## 2022-01-13 NOTE — Telephone Encounter (Signed)
LMTCB

## 2022-01-14 NOTE — Telephone Encounter (Signed)
I have attempted to contact patient 3 separate times with no success. Vm have been left for patient to callback

## 2022-01-15 NOTE — Telephone Encounter (Signed)
Called and advised pt Lantus is not covered and an alternative Basaglar was sent to her preferred pharmacy.

## 2022-01-27 DIAGNOSIS — F250 Schizoaffective disorder, bipolar type: Secondary | ICD-10-CM | POA: Diagnosis not present

## 2022-02-03 ENCOUNTER — Ambulatory Visit: Payer: BC Managed Care – PPO

## 2022-02-10 ENCOUNTER — Ambulatory Visit (INDEPENDENT_AMBULATORY_CARE_PROVIDER_SITE_OTHER): Payer: BC Managed Care – PPO | Admitting: Family Medicine

## 2022-02-10 ENCOUNTER — Encounter: Payer: Self-pay | Admitting: Family Medicine

## 2022-02-10 VITALS — BP 128/70 | HR 64 | Temp 97.3°F | Ht 66.0 in | Wt 252.4 lb

## 2022-02-10 DIAGNOSIS — E785 Hyperlipidemia, unspecified: Secondary | ICD-10-CM

## 2022-02-10 DIAGNOSIS — G894 Chronic pain syndrome: Secondary | ICD-10-CM | POA: Diagnosis not present

## 2022-02-10 DIAGNOSIS — E1142 Type 2 diabetes mellitus with diabetic polyneuropathy: Secondary | ICD-10-CM | POA: Diagnosis not present

## 2022-02-10 DIAGNOSIS — G8929 Other chronic pain: Secondary | ICD-10-CM | POA: Insufficient documentation

## 2022-02-10 DIAGNOSIS — K029 Dental caries, unspecified: Secondary | ICD-10-CM | POA: Diagnosis not present

## 2022-02-10 DIAGNOSIS — E1169 Type 2 diabetes mellitus with other specified complication: Secondary | ICD-10-CM | POA: Diagnosis not present

## 2022-02-10 DIAGNOSIS — E039 Hypothyroidism, unspecified: Secondary | ICD-10-CM | POA: Diagnosis not present

## 2022-02-10 DIAGNOSIS — I10 Essential (primary) hypertension: Secondary | ICD-10-CM

## 2022-02-10 NOTE — Assessment & Plan Note (Signed)
bp in fair control at this time  BP Readings from Last 1 Encounters:  02/10/22 128/70   No changes needed Most recent labs reviewed  Disc lifstyle change with low sodium diet and exercise  Stable with hctz 25 mg daily  Sees endo for dm2  Lab ordered

## 2022-02-10 NOTE — Progress Notes (Signed)
Subjective:    Patient ID: Alexa Woods, female    DOB: 1961/03/16, 61 y.o.   MRN: [id number]  HPI Pt presents to discuss medical excuse for jury duty for chronic pain and labile bs  Also hyperlipidemia and hypothyroid   Wt Readings from Last 3 Encounters:  02/10/22 252 lb 6 oz (114.5 kg)  12/16/21 253 lb (114.8 kg)  11/10/21 253 lb 3.2 oz (114.9 kg)   40.73 kg/m  Was called for jury duty  It is October 24 th  She is unable to go due to severe back pain   Cannot walk 1/4  mile  It will catch her and she has to stop  Cannot sit for more than 15 minutes at a time Has neck pain and fibromyalgia as well  Pain pills -oxycodone - makes her sleepy and forgetful lately    Diabetes-having some low blood sugars  Went down to 18 most recently  (has to be able to eat at any moment if it gets low)  Makes her dizzy  If she skips a meal  On jury duty - you could not emergently get a snack  Has floaters in vision from that (had laser eye surgery)   Had her teeth pulled for gum disease (top) The first denture did not fit and cut her gums Some gum areas are very slow to heal  Bottom teeth are fair-using a mouthwash to help save them and gum tissue     Hyperlipidemia Lab Results  Component Value Date   CHOL 135 12/19/2020   HDL 74.20 12/19/2020   LDLCALC 44 12/19/2020   LDLDIRECT 97.0 11/06/2020   TRIG 81.0 12/19/2020   CHOLHDL 2 12/19/2020    Lab Results  Component Value Date   TSH 3.97 12/19/2020    Off of her levothyroxine ? As to whether she would need it  Endocrinologist  She stopped it and does not feel different   HTN   bp is stable today  No cp or palpitations or headaches or edema  No side effects to medicines  BP Readings from Last 3 Encounters:  02/10/22 128/70  12/16/21 133/75  11/10/21 128/78    Hctz 25 mg daily   Patient Active Problem List   Diagnosis Date Noted   Chronic pain 02/10/2022   Dental decay 08/11/2021   Transportation  insecurity 05/09/2021   Financial insecurity 05/09/2021   Muscle weakness 02/19/2021   Anemia 01/28/2021   Abdominal pain 01/22/2021   Left-sided chest wall pain 12/06/2020   Colon cancer screening 11/08/2020   Current use of proton pump inhibitor 11/04/2020   History of total knee replacement, right 10/21/2020   Fracture of phalanx of foot 10/07/2020   Acute pain of right wrist 08/19/2020   Right foot injury 07/24/2020   Left ear pain 07/04/2020   Rib fractures 06/06/2020   Sprain of right wrist 04/10/2020   Trigger finger of right hand 12/20/2019   Hydrocephalus ([place]) 09/05/2019   Dry eyes 09/05/2019   Intractable headache 08/25/2019   Intractable nausea and vomiting 08/25/2019   Hypothyroidism 04/04/2019   Screening mammogram, encounter for 04/03/2019   Routine general medical examination at a health care facility 04/03/2019   Encounter for screening for HIV 04/03/2019   Encounter for hepatitis C screening test for low risk patient 04/03/2019   Visit for routine gyn exam 04/03/2019   Vitamin D deficiency 04/03/2019   Proximal humerus fracture 02/21/2019   Intertrigo 02/21/2019   Chronic constipation 12/26/2017  History of left knee replacement 12/26/2017   Primary osteoarthritis involving multiple joints 12/13/2017   Primary osteoarthritis of left knee 12/06/2017   Chronic neck pain 08/13/2017   Pain in left knee 06/22/2017   Pre-operative exam 04/05/2017   Morbid obesity ([place]) 01/21/2017   Joint swelling 12/20/2013   Tick bite of back 02/03/2013   Periodontal disease 02/03/2013   Hyperlipidemia associated with type 2 diabetes mellitus ([place]) 09/27/2012   Low back pain 07/22/2010   Hypokalemia 12/12/2008   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   Type 2 diabetes mellitus with peripheral neuropathy ([place]) 11/30/2006   History of alcohol abuse 11/30/2006   Depression with anxiety 11/30/2006   Essential hypertension 11/30/2006   HEMORRHOIDS 11/30/2006   Allergic rhinitis  11/30/2006   Asthma 11/30/2006   GERD without esophagitis 11/30/2006   Psoriasis 11/30/2006   Past Medical History:  Diagnosis Date   Alcohol abuse, in remission    since 1998   Allergic rhinitis    Anemia    Anxiety    Asthma    Bilateral lower extremity edema    Bulging lumbar disc    Bulging of cervical intervertebral disc    Chronic constipation    DDD (degenerative disc disease), lumbosacral    Depression    Dyspnea    Gastroparesis    GERD (gastroesophageal reflux disease)    Hemorrhoids    History of Bell's palsy 08/2007   left   History of chronic cystitis    IBS (irritable bowel syndrome)    Insulin dependent type 2 diabetes mellitus [facility])    endocrinologist-- dr Cruzita Lederer   Lower urinary tract symptoms (LUTS)    OA (osteoarthritis)    knees, back, hands, elbows   OSA (obstructive sleep apnea)    per study 06-08-2017 mild osa , cpap recommended , per pt insurance issue   Peripheral neuropathy    PONV (postoperative nausea and vomiting)    Psoriasis    S/P dilatation of esophageal stricture    Unspecified essential hypertension    Wears partial dentures    lower   Past Surgical History:  Procedure Laterality Date   CHOLECYSTECTOMY OPEN  1990   AND APPENDECTOMY   DOBUTAMINE STRESS ECHO  2009    normal stress echo, no evidence of ischemia   ELBOW SURGERY Bilateral RIGHT 2013;  LEFT 2016   for nerve damage   ESOPHAGOGASTRODUODENOSCOPY  07/2002   erythematous gastropathy   eye sugery Bilateral    KNEE ARTHROSCOPY Left 11/ 2018   dr Wynelle Link  @ [facility]   KNEE ARTHROSCOPY W/ PARTIAL MEDIAL MENISCECTOMY Right 10/2008   and chondroplasty   SHOULDER ARTHROSCOPY WITH DISTAL CLAVICLE RESECTION Left 12/ 2011   dr dean   TOTAL KNEE ARTHROPLASTY Right 07-01-2009  dr Wynelle Link   [facility]   TOTAL KNEE ARTHROPLASTY Left 12/06/2017   Procedure: LEFT TOTAL LEFT KNEE ARTHROPLASTY;  Surgeon: Gaynelle Arabian, MD;  Location: [facility];  Service: Orthopedics;  Laterality: Left;   VAGINAL  HYSTERECTOMY  2003   Social History   Tobacco Use   Smoking status: Former    Years: 25.00    Types: Cigarettes    Quit date: 12/30/2001    Years since quitting: 20.1   Smokeless tobacco: Never  Vaping Use   Vaping Use: Never used  Substance Use Topics   Alcohol use: No    Alcohol/week: 0.0 standard drinks of alcohol    Comment: hx alcoholism--  stopped 1998    Drug use: No   Family  History  Problem Relation Age of Onset   Alcohol abuse Mother    Hypertension Mother    Diabetes Mother    Heart failure Mother    Diabetes Sister    Heart attack Sister    Diabetes Sister    Diabetes Sister    Diabetes Sister    Diabetes Brother    Parkinson's disease Brother    Heart attack Brother    Heart attack Brother 43   Diabetes Brother    Diabetes Brother    Diabetes Brother    Esophageal cancer Maternal Grandmother    Lung cancer Other        mat great uncle   Celiac disease Daughter    Thyroid disease Niece    Colon cancer Neg Hx    Allergies  Allergen Reactions   Bupropion Hcl Other (See Comments)    Pt is unsure   Cefuroxime Axetil Nausea Only   Crestor [Rosuvastatin Calcium]     Pt states it makes her feel weird    Gabapentin Other (See Comments)    Pt does not remember reaction  Pt does not remember reaction    Lidocaine Other (See Comments)    REACTION: unknown REACTION: unknown   Metformin Other (See Comments)    REACTION: GI REACTION: GI   Other Other (See Comments)   Paroxetine Other (See Comments)    REACTION: doesn't agree REACTION: doesn't agree   Propoxyphene Other (See Comments)    wheezing wheezing   Tramadol Other (See Comments)    REACTION: Causes Anxiety   Tramadol Hcl     REACTION: Causes Anxiety   Wellbutrin [Bupropion] Other (See Comments)    Pt is unsure   Codeine Nausea And Vomiting and Rash   Sulfa Antibiotics Rash and Other (See Comments)   Current Outpatient Medications on File Prior to Visit  Medication Sig Dispense Refill    ACCU-CHEK GUIDE test strip USE TO TEST BLOOD SUGAR 4 TIMES DAILY AS INSTRUCTED. 400 strip 0   albuterol (VENTOLIN HFA) 108 (90 Base) MCG/ACT inhaler Inhale 2 puffs into the lungs every 4 (four) hours as needed for wheezing or shortness of breath. 18 each 3   BD INSULIN SYRINGE U/F 31G X 5/16" 0.5 ML MISC USE THREE TIMES PER DAY WITH R INSULIN & ONCE DAILY WITH LEVEMIR 400 each 2   cholecalciferol (VITAMIN D3) 25 MCG (1000 UNIT) tablet Take 5,000 Units by mouth daily.     Continuous Blood Gluc Receiver (DEXCOM G6 RECEIVER) DEVI Use as directed to check blood sugar. 1 each 0   Continuous Blood Gluc Sensor (DEXCOM G6 SENSOR) MISC Use as directed to check blood sugar. change every 10 days 9 each 3   Continuous Blood Gluc Transmit (DEXCOM G6 TRANSMITTER) MISC Use as directed to check blood sugar. Change every 90 days 1 each 3   docusate sodium (COLACE) 100 MG capsule Take 100 mg by mouth every morning.      fluticasone (FLONASE) 50 MCG/ACT nasal spray Place 2 sprays into both nostrils daily as needed for allergies or rhinitis. 16 g 5   hydrochlorothiazide (HYDRODIURIL) 25 MG tablet TAKE 1 TABLET EVERY DAY 90 tablet 1   Insulin Glargine (BASAGLAR KWIKPEN) 100 UNIT/ML INJECT 0.30-0.35 MLS (30-35 UNITS TOTAL) INTO THE SKIN DAILY. 30 mL 3   Insulin Pen Needle 32G X 4 MM MISC Use 4x a day 300 each 3   insulin regular (HUMULIN R) 100 units/mL injection INJECT 14 TO 24 UNITS TOTAL INTO THE  SKIN THREE TIMES A DAY BEFORE MEALS.     Insulin Regular Human (NOVOLIN R FLEXPEN RELION) 100 UNIT/ML KwikPen Inject 12 to 18 units under skin before each meal 45 mL 3   Lancets (ONETOUCH ULTRASOFT) lancets Use to test blood sugar 4 times daily as instructed. 200 each 11   LORazepam (ATIVAN) 2 MG tablet Take 2 mg by mouth 2 (two) times daily.     naloxegol oxalate (MOVANTIK) 25 MG TABS tablet Take 1 tablet (25 mg total) by mouth daily. 30 tablet 3   naloxone (NARCAN) 4 MG/0.1ML LIQD nasal spray kit      ondansetron (ZOFRAN)  4 MG tablet 4 mg by oral route.     oxyCODONE-acetaminophen (PERCOCET) 10-325 MG tablet Take 1 tablet by mouth every 4 (four) hours as needed for pain. Hold for SBP < = 105 12 tablet 0   pantoprazole (PROTONIX) 40 MG tablet TAKE 1 TABLET TWICE DAILY 180 tablet 1   potassium chloride (KLOR-CON M) 10 MEQ tablet TAKE 1 TABLET EVERY DAY 90 tablet 1   sertraline (ZOLOFT) 100 MG tablet Take 200 mg by mouth every morning.     tiZANidine (ZANAFLEX) 4 MG tablet Take 4 mg by mouth 2 (two) times daily as needed for muscle spasms.      triamcinolone cream (KENALOG) 0.1 % Apply 1 application topically 2 (two) times daily. To psoriasis areas 30 g 0   zolpidem (AMBIEN) 10 MG tablet Take 10 mg by mouth at bedtime as needed for sleep.     rosuvastatin (CRESTOR) 5 MG tablet Take 1 tablet by mouth daily. (Patient not taking: Reported on 12/16/2021)     No current facility-administered medications on file prior to visit.    Review of Systems  Constitutional:  Positive for fatigue. Negative for activity change, appetite change, fever and unexpected weight change.  HENT:  Negative for congestion, ear pain, rhinorrhea, sinus pressure and sore throat.   Eyes:  Negative for pain, redness and visual disturbance.  Respiratory:  Negative for cough, shortness of breath and wheezing.   Cardiovascular:  Negative for chest pain and palpitations.  Gastrointestinal:  Negative for abdominal pain, blood in stool, constipation and diarrhea.  Endocrine: Positive for polyphagia. Negative for polydipsia and polyuria.  Genitourinary:  Negative for dysuria, frequency and urgency.  Musculoskeletal:  Positive for arthralgias, back pain and neck pain. Negative for myalgias.  Skin:  Negative for pallor and rash.  Allergic/Immunologic: Negative for environmental allergies.  Neurological:  Positive for headaches. Negative for dizziness and syncope.       Gets dizzy of blood sugar is too low   Hematological:  Negative for adenopathy. Does  not bruise/bleed easily.  Psychiatric/Behavioral:  Negative for decreased concentration and dysphoric mood. The patient is not nervous/anxious.        Objective:   Physical Exam Constitutional:      General: She is not in acute distress.    Appearance: Normal appearance. She is well-developed. She is obese. She is not ill-appearing or diaphoretic.  HENT:     Head: Normocephalic and atraumatic.     Mouth/Throat:     Mouth: Mucous membranes are moist.  Eyes:     General: No scleral icterus.    Conjunctiva/sclera: Conjunctivae normal.     Pupils: Pupils are equal, round, and reactive to light.  Neck:     Thyroid: No thyromegaly.     Vascular: No carotid bruit or JVD.  Cardiovascular:     Rate and Rhythm: Normal  rate and regular rhythm.     Heart sounds: Normal heart sounds.     No gallop.  Pulmonary:     Effort: Pulmonary effort is normal. No respiratory distress.     Breath sounds: Normal breath sounds. No wheezing or rales.  Abdominal:     General: There is no distension or abdominal bruit.     Palpations: Abdomen is soft.  Musculoskeletal:     Cervical back: Normal range of motion and neck supple.     Right lower leg: No edema.     Left lower leg: No edema.     Comments: Poor rom neck and spine Gait is very slow and labored Needs assist to get on table   Lymphadenopathy:     Cervical: No cervical adenopathy.  Skin:    General: Skin is warm and dry.     Coloration: Skin is not pale.     Findings: No rash.  Neurological:     Mental Status: She is alert.     Coordination: Coordination normal.     Deep Tendon Reflexes: Reflexes are normal and symmetric. Reflexes normal.  Psychiatric:        Attention and Perception: Attention normal.        Mood and Affect: Mood is depressed.           Assessment & Plan:   Problem List Items Addressed This Visit       Cardiovascular and Mediastinum   Essential hypertension    bp in fair control at this time  BP Readings  from Last 1 Encounters:  02/10/22 128/70  No changes needed Most recent labs reviewed  Disc lifstyle change with low sodium diet and exercise  Stable with hctz 25 mg daily  Sees endo for dm2  Lab ordered       Relevant Orders   Comprehensive metabolic panel     Digestive   Dental decay    Pt had upper teeth pulled  She is trying to get a denture that fits  Treating lower teeth          Endocrine   Hyperlipidemia associated with type 2 diabetes mellitus ([place])    Due for a lipid panel  Cannot fast for labs due to hypotlycemia  Took crestor 5 mg briefly and did not continue it   Lab today      Relevant Orders   Comprehensive metabolic panel   Lipid panel   Hypothyroidism    TSH ordered No longer takes her levothyroxine 88 mcg  She thinks her endocrinologist indicated she did not need it  No big clinical changes       Relevant Orders   TSH   Type 2 diabetes mellitus with peripheral neuropathy ([place])    Sees endocrinology  She has some low blood glucose levels  Takes basal and regular insulin Gets hypoglycemic easily when she cannot eat regularly and on demand  I do not think she is safe for jury duty now due to labile glucose Note written        Other   Chronic pain - Primary    Neck and back pain with deg disc dz  Also OA  Also fibromyalgia She is unable to sit more than 15 minutes  Unable to walk for more than 10 minutes  Takes chronic oxycodone for pain   Note written -unable to serve jury duty due to above

## 2022-02-10 NOTE — Patient Instructions (Addendum)
Here is a [employer] for jury duty. If you health improves in the future perhaps you can do it another time.  Take care of yourself  Keep watching your blood sugar   Blood pressure looks good   Labs for chemistries and cholesterol and thyroid today

## 2022-02-10 NOTE — Assessment & Plan Note (Signed)
Sees endocrinology  She has some low blood glucose levels  Takes basal and regular insulin Gets hypoglycemic easily when she cannot eat regularly and on demand  I do not think she is safe for jury duty now due to labile glucose Note written

## 2022-02-10 NOTE — Assessment & Plan Note (Signed)
Due for a lipid panel  Cannot fast for labs due to hypotlycemia  Took crestor 5 mg briefly and did not continue it   Lab today

## 2022-02-10 NOTE — Assessment & Plan Note (Signed)
Neck and back pain with deg disc dz  Also OA  Also fibromyalgia She is unable to sit more than 15 minutes  Unable to walk for more than 10 minutes  Takes chronic oxycodone for pain   Note written -unable to serve jury duty due to above

## 2022-02-10 NOTE — Assessment & Plan Note (Signed)
TSH ordered No longer takes her levothyroxine 88 mcg  She thinks her endocrinologist indicated she did not need it  No big clinical changes

## 2022-02-10 NOTE — Assessment & Plan Note (Signed)
Pt had upper teeth pulled  She is trying to get a denture that fits  Treating lower teeth

## 2022-02-11 ENCOUNTER — Telehealth: Payer: Self-pay | Admitting: *Deleted

## 2022-02-11 ENCOUNTER — Encounter: Payer: Self-pay | Admitting: *Deleted

## 2022-02-11 LAB — COMPREHENSIVE METABOLIC PANEL
ALT: 27 U/L (ref 0–35)
AST: 28 U/L (ref 0–37)
Albumin: 3.9 g/dL (ref 3.5–5.2)
Alkaline Phosphatase: 162 U/L — ABNORMAL HIGH (ref 39–117)
BUN: 20 mg/dL (ref 6–23)
CO2: 31 mEq/L (ref 19–32)
Calcium: 9.2 mg/dL (ref 8.4–10.5)
Chloride: 101 mEq/L (ref 96–112)
Creatinine, Ser: 1.06 mg/dL (ref 0.40–1.20)
GFR: 57.74 mL/min — ABNORMAL LOW (ref >60.00)
Glucose, Bld: 181 mg/dL — ABNORMAL HIGH (ref 70–99)
Potassium: 4.1 mEq/L (ref 3.5–5.1)
Sodium: 141 mEq/L (ref 135–145)
Total Bilirubin: 0.3 mg/dL (ref 0.2–1.2)
Total Protein: 7.1 g/dL (ref 6.0–8.3)

## 2022-02-11 LAB — LIPID PANEL
Cholesterol: 189 mg/dL (ref 0–200)
HDL: 61.3 mg/dL (ref >39.00)
LDL Cholesterol: 106 mg/dL — ABNORMAL HIGH (ref 0–99)
NonHDL: 127.91
Total CHOL/HDL Ratio: 3
Triglycerides: 112 mg/dL (ref 0.0–149.0)
VLDL: 22.4 mg/dL (ref 0.0–40.0)

## 2022-02-11 LAB — TSH: TSH: 3.6 u[IU]/mL (ref 0.35–5.50)

## 2022-02-11 NOTE — Telephone Encounter (Signed)
Letter mailed

## 2022-02-11 NOTE — Telephone Encounter (Signed)
-----   Message from Abner Greenspan, MD sent at 02/11/2022  2:03 PM EDT ----- TSH is in the normal range  She does not need to re start thyroid medicine  Glucose 181   Cholesterol is up  I think she was on a statin in the past and that did help (stopped crestor because it made her feel bad)  Avoid red meat/ fried foods/ egg yolks/ fatty breakfast meats/ butter, cheese and high fat dairy/ and shellfish

## 2022-02-26 DIAGNOSIS — M5459 Other low back pain: Secondary | ICD-10-CM | POA: Diagnosis not present

## 2022-02-26 DIAGNOSIS — Z79899 Other long term (current) drug therapy: Secondary | ICD-10-CM | POA: Diagnosis not present

## 2022-02-26 DIAGNOSIS — Z79891 Long term (current) use of opiate analgesic: Secondary | ICD-10-CM | POA: Diagnosis not present

## 2022-02-26 DIAGNOSIS — Z5181 Encounter for therapeutic drug level monitoring: Secondary | ICD-10-CM | POA: Diagnosis not present

## 2022-03-03 ENCOUNTER — Encounter: Payer: Self-pay | Admitting: Internal Medicine

## 2022-03-03 ENCOUNTER — Ambulatory Visit (INDEPENDENT_AMBULATORY_CARE_PROVIDER_SITE_OTHER): Payer: BC Managed Care – PPO | Admitting: Internal Medicine

## 2022-03-03 VITALS — BP 122/72 | HR 73 | Ht 66.0 in | Wt 250.0 lb

## 2022-03-03 DIAGNOSIS — E559 Vitamin D deficiency, unspecified: Secondary | ICD-10-CM

## 2022-03-03 DIAGNOSIS — E785 Hyperlipidemia, unspecified: Secondary | ICD-10-CM

## 2022-03-03 DIAGNOSIS — Z794 Long term (current) use of insulin: Secondary | ICD-10-CM | POA: Diagnosis not present

## 2022-03-03 DIAGNOSIS — E1159 Type 2 diabetes mellitus with other circulatory complications: Secondary | ICD-10-CM

## 2022-03-03 LAB — POCT GLYCOSYLATED HEMOGLOBIN (HGB A1C): Hemoglobin A1C: 7.6 % — AB (ref 4.0–5.6)
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
## 2022-03-03 NOTE — Patient Instructions (Addendum)
Please continue: - Lantus 30 units at bedtime - R insulin: 30 min before meals 16-18 >> 20-22 units before b'fast  15-16 units before lunch (17-18 units if eating cereals) 12-14 units before dinner If you plan to be more active after a meal, please reduce the insulin before that meal by 5 units. If you have to take the insulin after you eat, take only half of the dose.  Restart vitamin D 2000 units daily.  Please come back for a follow-up appointment in 4 months.

## 2022-03-05 NOTE — Addendum Note (Signed)
Addended by: Cinda Quest on: 03/05/2022 08:34 AM   Modules accepted: Orders

## 2022-03-12 DIAGNOSIS — M65332 Trigger finger, left middle finger: Secondary | ICD-10-CM | POA: Diagnosis not present

## 2022-04-08 ENCOUNTER — Telehealth: Payer: Self-pay | Admitting: Family Medicine

## 2022-04-08 NOTE — Telephone Encounter (Signed)
[facility] - Client TELEPHONE ADVICE RECORD [facility]urse Patient Name: Alexa Woods OR TIZ Gender: Female DOB: April 08, 1961 Age: 61 Y 22 D Return Phone Number: [phone] (Primary) Address: [address] City/ State/ Zip: [place]  [address] Client [facility] - Client Client Site [place] Provider Ria Bush - MD Contact Type Call Who Is Calling Patient / Member / Family / Caregiver Call Type Triage / Clinical Relationship To Patient Self Return Phone Number [phone] (Primary) Chief Complaint Abdominal Pain Reason for Call Symptomatic / Request for Health Information Initial Comment Caller was transferred from the office, patient is experiencing stomach cramps and nausea. Translation No Nurse Assessment Nurse: Alexa Christen, RN, [name] Date/Time [name] Time): 04/08/2022 3:53:58 PM Confirm and document reason for call. If symptomatic, describe symptoms. ---Caller states that she is hurting in the top of her stomach. Pain started about a week ago. States she has had salmonella poisoning before and this is similar to that. Pain is not constant. Having pain right now and has been a long time currently. Constipated also. Last BM was recent but "wasn't easy". Does the patient have any new or worsening symptoms? ---Yes Will a triage be completed? ---Yes Related visit to physician within the last 2 weeks? ---No Does the PT have any chronic conditions? (i.e. diabetes, asthma, this includes High risk factors for pregnancy, etc.) ---Yes List chronic conditions. ---Dm2, on diuretics, HTN Is this a behavioral health or substance abuse call? ---No Guidelines Guideline Title Affirmed Question Affirmed Notes Nurse Date/Time (Eastern Time) Abdominal Pain - Female [1] MODERATE pain (e.g., interferes with normal activities) AND [2] pain comes and goes (cramps) AND [3] present >  24 hours (Exception: Pain with [name] 04/08/2022 3:56:25 PM PLEASE NOTE: All timestamps contained within this report are represented as [place] Standard Time. CONFIDENTIALTY NOTICE: This fax transmission is intended only for the addressee. It contains information that is legally privileged, confidential or otherwise protected from use or disclosure. If you are not the intended recipient, you are strictly prohibited from reviewing, disclosing, copying using or disseminating any of this information or taking any action in reliance on or regarding this information. If you have received this fax in error, please notify us immediately by telephone so that we can arrange for its return to [place]. Phone: [phone], Toll-Free: [phone], Fax: [phone] Page: 2 of 2 Call Id: [id number] Guidelines Guideline Title Affirmed Question Affirmed Notes Nurse Date/Time [name] Time) Vomiting or Diarrhea - see that Guideline.) Disp. Time [name] Time) Disposition Final User 04/08/2022 3:27:36 PM Attempt made - message left [name] 04/08/2022 3:43:58 PM Attempt made - message left [name] 04/08/2022 4:01:44 PM See PCP within 24 Hours Yes Alexa Christen RN, [name] Final Disposition 04/08/2022 4:01:44 PM See PCP within 24 Hours Yes Alexa Christen, RN, [name] Disagree/Comply Comply Caller Understands Yes PreDisposition Did not know what to do Care Advice Given Per Guideline SEE PCP WITHIN 24 HOURS: CARE ADVICE given per Abdominal Pain - Female (Adult) guideline. * You become worse * Constant pain lasts over 2 hours * Severe pain lasts over 1 hour CALL BACK IF: * This medicine can help reduce diarrhea, vomiting, and abdomen cramping. It is available over-the-counter (OTC) in a drugstore. * Drink adequate fluids. Eat a bland diet. Comments User: [name], RN Date/Time [name] Time): 04/08/2022 3:57:16 PM Rates pain 7/10 User: [name], RN Date/Time [name]  Time): 04/08/2022 4:04:00 PM Per directive  informed caller to let office know that she needed an appointment within 24 hours. Transferred to main number. Referrals REFERRED TO PCP OFFIC

## 2022-04-08 NOTE — Telephone Encounter (Signed)
I spoke with pt and on and off for 5 days pt has had upper abd pain and constipation. Pt already triaged by access and has appt already scheduled with Dr Glori Bickers on 04/09/22 at 4pm. I offered pt an appt with different provider at [facility] on 04/09/22 but pt said she had a dental appt tomorrow that she could not miss and wanted to keep appt already scheduled with Dr Glori Bickers on 04/10/22. UC & ED precaution given and pt voiced understanding and appreciated the call.sending note to Dr Glori Bickers and [employer].

## 2022-04-08 NOTE — Telephone Encounter (Signed)
Aware, will see her then  Agree with ER precautions

## 2022-04-08 NOTE — Telephone Encounter (Signed)
Patient called and stated she might have Salmonella poisoning, she stated she is not able to use the bathroom and when she does it is very difficult. Patient was sent to access nurse.

## 2022-04-10 ENCOUNTER — Ambulatory Visit (INDEPENDENT_AMBULATORY_CARE_PROVIDER_SITE_OTHER): Payer: BC Managed Care – PPO | Admitting: Family Medicine

## 2022-04-10 ENCOUNTER — Encounter: Payer: Self-pay | Admitting: Family Medicine

## 2022-04-10 VITALS — BP 136/68 | HR 74 | Temp 97.4°F | Ht 66.0 in | Wt 252.4 lb

## 2022-04-10 DIAGNOSIS — R1033 Periumbilical pain: Secondary | ICD-10-CM | POA: Diagnosis not present

## 2022-04-10 DIAGNOSIS — K5909 Other constipation: Secondary | ICD-10-CM | POA: Diagnosis not present

## 2022-04-10 DIAGNOSIS — L219 Seborrheic dermatitis, unspecified: Secondary | ICD-10-CM | POA: Diagnosis not present

## 2022-04-10 DIAGNOSIS — K219 Gastro-esophageal reflux disease without esophagitis: Secondary | ICD-10-CM | POA: Diagnosis not present

## 2022-04-10 NOTE — Assessment & Plan Note (Signed)
In setting of DM gastroparesis and chronic narcotics   Recommend increasing the fluid to 64 oz per day  Miralax 2-3 times daily  Try to get in 5 produce servings per day (difficult with no teeth)- cooked /prepped to chew   Update if not starting to improve in a week or if worsening

## 2022-04-10 NOTE — Patient Instructions (Addendum)
I want you to get miralax and take it 2-3 times daily mixed with water  Make sure you get 64 oz of fluid daily   Aim for 5 servings of produce (fruit and veggies) per day  Frozen is ok  Cool to make them soft and easy to eat  Apple sauce with no added sugar   There better your diabetes is the less problems you should have with nausea    Your percocet is making constipation worse also   For your scalp you can try T gel shampoo once per week  A little goes a long way  Let sit for 5 minutes before rinsing

## 2022-04-10 NOTE — Assessment & Plan Note (Signed)
Some itching Recommend T gel shampoo as needed once weekly  Try not to scratch This does not look like psoriasis today
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
## 2022-04-12 NOTE — Assessment & Plan Note (Signed)
Suspect due to both constipation and some dm gastroparesis No change in abd exam/ reassuring  Rev last EGD and colonoscopy from this summer  Enc to keep meals small  Inc fruits and veg and water  Trial of miralax

## 2022-04-14 DIAGNOSIS — F250 Schizoaffective disorder, bipolar type: Secondary | ICD-10-CM | POA: Diagnosis not present

## 2022-04-30 ENCOUNTER — Other Ambulatory Visit: Payer: Self-pay | Admitting: Family Medicine

## 2022-06-05 ENCOUNTER — Other Ambulatory Visit: Payer: Self-pay | Admitting: Internal Medicine

## 2022-06-05 DIAGNOSIS — Z794 Long term (current) use of insulin: Secondary | ICD-10-CM

## 2022-06-11 ENCOUNTER — Telehealth: Payer: Self-pay | Admitting: Family Medicine

## 2022-06-11 NOTE — Telephone Encounter (Signed)
LVM for pt to rtn my call to schedule AWV with [facility] call back # [phone] 

## 2022-06-18 ENCOUNTER — Other Ambulatory Visit: Payer: Self-pay | Admitting: Internal Medicine

## 2022-06-18 DIAGNOSIS — M65332 Trigger finger, left middle finger: Secondary | ICD-10-CM | POA: Diagnosis not present

## 2022-06-18 MED ORDER — TRESIBA FLEXTOUCH 200 UNIT/ML ~~LOC~~ SOPN: 30.0000 [IU] | PEN_INJECTOR | Freq: Every day | SUBCUTANEOUS | 3 refills | Status: DC

## 2022-06-25 DIAGNOSIS — Z79891 Long term (current) use of opiate analgesic: Secondary | ICD-10-CM | POA: Diagnosis not present

## 2022-06-25 DIAGNOSIS — M5416 Radiculopathy, lumbar region: Secondary | ICD-10-CM | POA: Diagnosis not present

## 2022-07-06 ENCOUNTER — Ambulatory Visit (INDEPENDENT_AMBULATORY_CARE_PROVIDER_SITE_OTHER): Payer: BC Managed Care – PPO | Admitting: Internal Medicine

## 2022-07-06 ENCOUNTER — Encounter: Payer: Self-pay | Admitting: Internal Medicine

## 2022-07-06 ENCOUNTER — Other Ambulatory Visit: Payer: Self-pay | Admitting: Internal Medicine

## 2022-07-06 VITALS — BP 100/68 | HR 65 | Ht 66.0 in | Wt 249.0 lb

## 2022-07-06 DIAGNOSIS — E785 Hyperlipidemia, unspecified: Secondary | ICD-10-CM | POA: Diagnosis not present

## 2022-07-06 DIAGNOSIS — E1159 Type 2 diabetes mellitus with other circulatory complications: Secondary | ICD-10-CM

## 2022-07-06 DIAGNOSIS — Z794 Long term (current) use of insulin: Secondary | ICD-10-CM

## 2022-07-06 DIAGNOSIS — E559 Vitamin D deficiency, unspecified: Secondary | ICD-10-CM

## 2022-07-06 LAB — POCT GLYCOSYLATED HEMOGLOBIN (HGB A1C): Hemoglobin A1C: 9.3 % — AB (ref 4.0–5.6)

## 2022-07-06 MED ORDER — LYUMJEV KWIKPEN 200 UNIT/ML ~~LOC~~ SOPN: PEN_INJECTOR | SUBCUTANEOUS | 3 refills | Status: DC

## 2022-07-06 NOTE — Patient Instructions (Addendum)
Please continue: - Tresiba 30 units at bedtime - R insulin: 30 min before meals or Lyumjev: immediately before the meals 20-22 units before b'fast  15-16 units before lunch (17-18 units if eating cereals) 12-14 units before dinner If you plan to be more active after a meal, please reduce the insulin before that meal by 5 units. If you have to take the insulin after you eat, take only half of the dose.  Restart vitamin D 2000 units daily.  Please come back for a follow-up appointment in 4 months.  Please consider the following ways to cut down carbs and fat and increase fiber and micronutrients in your diet: - substitute whole grain for white bread or pasta - substitute brown rice for white rice - substitute 90-calorie flat bread pieces for slices of bread when possible - substitute sweet potatoes or yams for white potatoes - substitute humus for margarine - substitute tofu for cheese when possible - substitute almond or rice milk for regular milk (would not drink soy milk daily due to concern for soy estrogen influence on breast cancer risk) - substitute dark chocolate for other sweets when possible - substitute water - can add lemon or orange slices for taste - for diet sodas (artificial sweeteners will trick your body that you can eat sweets without getting calories and will lead you to overeating and weight gain in the long run) - do not skip breakfast or other meals (this will slow down the metabolism and will result in more weight gain over time)  - can try smoothies made from fruit and almond/rice milk in am instead of regular breakfast - can also try old-fashioned (not instant) oatmeal made with almond/rice milk in am - order the dressing on the side when eating salad at a restaurant (pour less than half of the dressing on the salad) - eat as little meat as possible - can try juicing, but should not forget that juicing will get rid of the fiber, so would alternate with eating raw  veg./fruits or drinking smoothies - use as little oil as possible, even when using olive oil - can dress a salad with a mix of balsamic vinegar and lemon juice, for e.g. - use agave nectar, stevia sugar, or regular sugar rather than artificial sweateners - steam or broil/roast veggies  - snack on veggies/fruit/nuts (unsalted, preferably) when possible, rather than processed foods - reduce or eliminate aspartame in diet (it is in diet sodas, chewing gum, etc) Read the labels!  Try to read Dr. Janene Harvey book: "Program for Reversing Diabetes" for other ideas for healthy eating.  Also, Narda Amber: "The Kick Diabetes CookBook"
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
## 2022-07-21 ENCOUNTER — Telehealth: Payer: Self-pay | Admitting: Family Medicine

## 2022-07-21 DIAGNOSIS — E1169 Type 2 diabetes mellitus with other specified complication: Secondary | ICD-10-CM

## 2022-07-21 DIAGNOSIS — E559 Vitamin D deficiency, unspecified: Secondary | ICD-10-CM

## 2022-07-21 DIAGNOSIS — I10 Essential (primary) hypertension: Secondary | ICD-10-CM

## 2022-07-21 DIAGNOSIS — E039 Hypothyroidism, unspecified: Secondary | ICD-10-CM

## 2022-07-21 DIAGNOSIS — Z79899 Other long term (current) drug therapy: Secondary | ICD-10-CM

## 2022-07-21 NOTE — Telephone Encounter (Signed)
-----   Message from Velna Hatchet, RT sent at 07/14/2022 11:46 AM EST ----- Regarding: Wed 2/21 lab Patient is scheduled for cpx, please order future labs.  Thanks, Anda Kraft

## 2022-07-22 ENCOUNTER — Other Ambulatory Visit (INDEPENDENT_AMBULATORY_CARE_PROVIDER_SITE_OTHER): Payer: BC Managed Care – PPO

## 2022-07-22 DIAGNOSIS — I10 Essential (primary) hypertension: Secondary | ICD-10-CM

## 2022-07-22 DIAGNOSIS — Z79899 Other long term (current) drug therapy: Secondary | ICD-10-CM

## 2022-07-22 DIAGNOSIS — E039 Hypothyroidism, unspecified: Secondary | ICD-10-CM | POA: Diagnosis not present

## 2022-07-22 DIAGNOSIS — E1169 Type 2 diabetes mellitus with other specified complication: Secondary | ICD-10-CM | POA: Diagnosis not present

## 2022-07-22 DIAGNOSIS — E559 Vitamin D deficiency, unspecified: Secondary | ICD-10-CM

## 2022-07-22 DIAGNOSIS — E785 Hyperlipidemia, unspecified: Secondary | ICD-10-CM

## 2022-07-23 ENCOUNTER — Ambulatory Visit (INDEPENDENT_AMBULATORY_CARE_PROVIDER_SITE_OTHER): Payer: BC Managed Care – PPO

## 2022-07-23 VITALS — Wt 249.0 lb

## 2022-07-23 DIAGNOSIS — Z789 Other specified health status: Secondary | ICD-10-CM

## 2022-07-23 DIAGNOSIS — Z0000 Encounter for general adult medical examination without abnormal findings: Secondary | ICD-10-CM | POA: Diagnosis not present

## 2022-07-23 LAB — CBC WITH DIFFERENTIAL/PLATELET
Basophils Absolute: 0.1 10*3/uL (ref 0.0–0.1)
Basophils Relative: 0.9 % (ref 0.0–3.0)
Eosinophils Absolute: 0.1 10*3/uL (ref 0.0–0.7)
Eosinophils Relative: 1.4 % (ref 0.0–5.0)
HCT: 38 % (ref 36.0–46.0)
Hemoglobin: 12.8 g/dL (ref 12.0–15.0)
Lymphocytes Relative: 21.7 % (ref 12.0–46.0)
Lymphs Abs: 1.5 10*3/uL (ref 0.7–4.0)
MCHC: 33.7 g/dL (ref 30.0–36.0)
MCV: 83.7 fl (ref 78.0–100.0)
Monocytes Absolute: 0.4 10*3/uL (ref 0.1–1.0)
Monocytes Relative: 5.2 % (ref 3.0–12.0)
Neutro Abs: 5 10*3/uL (ref 1.4–7.7)
Neutrophils Relative %: 70.8 % (ref 43.0–77.0)
Platelets: 220 10*3/uL (ref 150.0–400.0)
RBC: 4.54 Mil/uL (ref 3.87–5.11)
RDW: 13.5 % (ref 11.5–15.5)
WBC: 7.1 10*3/uL (ref 4.0–10.5)

## 2022-07-23 LAB — LIPID PANEL
Cholesterol: 206 mg/dL — ABNORMAL HIGH (ref 0–200)
HDL: 67 mg/dL (ref >39.00)
LDL Cholesterol: 106 mg/dL — ABNORMAL HIGH (ref 0–99)
NonHDL: 139.39
Total CHOL/HDL Ratio: 3
Triglycerides: 166 mg/dL — ABNORMAL HIGH (ref 0.0–149.0)
VLDL: 33.2 mg/dL (ref 0.0–40.0)

## 2022-07-23 LAB — COMPREHENSIVE METABOLIC PANEL
ALT: 26 U/L (ref 0–35)
AST: 20 U/L (ref 0–37)
Albumin: 4 g/dL (ref 3.5–5.2)
Alkaline Phosphatase: 145 U/L — ABNORMAL HIGH (ref 39–117)
BUN: 21 mg/dL (ref 6–23)
CO2: 30 mEq/L (ref 19–32)
Calcium: 9 mg/dL (ref 8.4–10.5)
Chloride: 100 mEq/L (ref 96–112)
Creatinine, Ser: 0.97 mg/dL (ref 0.40–1.20)
GFR: 64.03 mL/min (ref >60.00)
Glucose, Bld: 299 mg/dL — ABNORMAL HIGH (ref 70–99)
Potassium: 3.9 mEq/L (ref 3.5–5.1)
Sodium: 139 mEq/L (ref 135–145)
Total Bilirubin: 0.3 mg/dL (ref 0.2–1.2)
Total Protein: 6.9 g/dL (ref 6.0–8.3)

## 2022-07-23 LAB — IRON: Iron: 75 ug/dL (ref 42–145)

## 2022-07-23 LAB — VITAMIN B12: Vitamin B-12: 331 pg/mL (ref 211–911)

## 2022-07-23 LAB — TSH: TSH: 7.09 u[IU]/mL — ABNORMAL HIGH (ref 0.35–5.50)

## 2022-07-23 LAB — VITAMIN D 25 HYDROXY (VIT D DEFICIENCY, FRACTURES): VITD: 18.98 ng/mL — ABNORMAL LOW (ref 30.00–100.00)
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
## 2022-07-23 NOTE — Patient Instructions (Signed)
Alexa Woods , Thank you for taking time to come for your Medicare Wellness Visit. I appreciate your ongoing commitment to your health goals. Please review the following plan we discussed and let me know if I can assist you in the future.   These are the goals we discussed:  Goals      Patient Stated     Lose weight         This is a list of the screening recommended for you and due dates:  Health Maintenance  Topic Date Due   COVID-19 Vaccine (1) Never done   Zoster (Shingles) Vaccine (1 of 2) Never done   Yearly kidney health urinalysis for diabetes  07/27/2020   Pap Smear  04/02/2022   Eye exam for diabetics  05/15/2022   Flu Shot  08/30/2022*   Mammogram  10/06/2022   Complete foot exam   11/01/2022   Hemoglobin A1C  01/04/2023   Yearly kidney function blood test for diabetes  07/23/2023   Medicare Annual Wellness Visit  07/24/2023   DTaP/Tdap/Td vaccine (4 - Td or Tdap) 08/21/2028   Colon Cancer Screening  12/17/2031   Hepatitis C Screening: USPSTF Recommendation to screen - Ages 69-79 yo.  Completed   HIV Screening  Completed   HPV Vaccine  Aged Out  *Topic was postponed. The date shown is not the original due date.    Advanced directives: Advance directive discussed with you today. Even though you declined this today please call our office should you change your mind and we can give you the proper paperwork for you to fill out.  Conditions/risks identified: lose some weight   Next appointment: Follow up in one year for your annual wellness visit.   Preventive Care 40-64 Years, Female Preventive care refers to lifestyle choices and visits with your health care provider that can promote health and wellness. What does preventive care include? A yearly physical exam. This is also called an annual well check. Dental exams once or twice a year. Routine eye exams. Ask your health care provider how often you should have your eyes checked. Personal lifestyle choices,  including: Daily care of your teeth and gums. Regular physical activity. Eating a healthy diet. Avoiding tobacco and drug use. Limiting alcohol use. Practicing safe sex. Taking low-dose aspirin daily starting at age 81. Taking vitamin and mineral supplements as recommended by your health care provider. What happens during an annual well check? The services and screenings done by your health care provider during your annual well check will depend on your age, overall health, lifestyle risk factors, and family history of disease. Counseling  Your health care provider may ask you questions about your: Alcohol use. Tobacco use. Drug use. Emotional well-being. Home and relationship well-being. Sexual activity. Eating habits. Work and work Statistician. Method of birth control. Menstrual cycle. Pregnancy history. Screening  You may have the following tests or measurements: Height, weight, and BMI. Blood pressure. Lipid and cholesterol levels. These may be checked every 5 years, or more frequently if you are over 42 years old. Skin check. Lung cancer screening. You may have this screening every year starting at age 109 if you have a 30-pack-year history of smoking and currently smoke or have quit within the past 15 years. Fecal occult blood test (FOBT) of the stool. You may have this test every year starting at age 31. Flexible sigmoidoscopy or colonoscopy. You may have a sigmoidoscopy every 5 years or a colonoscopy every 10 years starting at  age 17. Hepatitis C blood test. Hepatitis B blood test. Sexually transmitted disease (STD) testing. Diabetes screening. This is done by checking your blood sugar (glucose) after you have not eaten for a while (fasting). You may have this done every 1-3 years. Mammogram. This may be done every 1-2 years. Talk to your health care provider about when you should start having regular mammograms. This may depend on whether you have a family history of  breast cancer. BRCA-related cancer screening. This may be done if you have a family history of breast, ovarian, tubal, or peritoneal cancers. Pelvic exam and Pap test. This may be done every 3 years starting at age 24. Starting at age 80, this may be done every 5 years if you have a Pap test in combination with an HPV test. Bone density scan. This is done to screen for osteoporosis. You may have this scan if you are at high risk for osteoporosis. Discuss your test results, treatment options, and if necessary, the need for more tests with your health care provider. Vaccines  Your health care provider may recommend certain vaccines, such as: Influenza vaccine. This is recommended every year. Tetanus, diphtheria, and acellular pertussis (Tdap, Td) vaccine. You may need a Td booster every 10 years. Zoster vaccine. You may need this after age 33. Pneumococcal 13-valent conjugate (PCV13) vaccine. You may need this if you have certain conditions and were not previously vaccinated. Pneumococcal polysaccharide (PPSV23) vaccine. You may need one or two doses if you smoke cigarettes or if you have certain conditions. Talk to your health care provider about which screenings and vaccines you need and how often you need them. This information is not intended to replace advice given to you by your health care provider. Make sure you discuss any questions you have with your health care provider. Document Released: 06/14/2015 Document Revised: 02/05/2016 Document Reviewed: 03/19/2015 Elsevier Interactive Patient Education  2017 [place] Prevention in the Home Falls can cause injuries. They can happen to people of all ages. There are many things you can do to make your home safe and to help prevent falls. What can I do on the outside of my home? Regularly fix the edges of walkways and driveways and fix any cracks. Remove anything that might make you trip as you walk through a door, such as a  raised step or threshold. Trim any bushes or trees on the path to your home. Use bright outdoor lighting. Clear any walking paths of anything that might make someone trip, such as rocks or tools. Regularly check to see if handrails are loose or broken. Make sure that both sides of any steps have handrails. Any raised decks and porches should have guardrails on the edges. Have any leaves, snow, or ice cleared regularly. Use sand or salt on walking paths during winter. Clean up any spills in your garage right away. This includes oil or grease spills. What can I do in the bathroom? Use night lights. Install grab bars by the toilet and in the tub and shower. Do not use towel bars as grab bars. Use non-skid mats or decals in the tub or shower. If you need to sit down in the shower, use a plastic, non-slip stool. Keep the floor dry. Clean up any water that spills on the floor as soon as it happens. Remove soap buildup in the tub or shower regularly. Attach bath mats securely with double-sided non-slip rug tape. Do not have throw rugs and  other things on the floor that can make you trip. What can I do in the bedroom? Use night lights. Make sure that you have a light by your bed that is easy to reach. Do not use any sheets or blankets that are too big for your bed. They should not hang down onto the floor. Have a firm chair that has side arms. You can use this for support while you get dressed. Do not have throw rugs and other things on the floor that can make you trip. What can I do in the kitchen? Clean up any spills right away. Avoid walking on wet floors. Keep items that you use a lot in easy-to-reach places. If you need to reach something above you, use a strong step stool that has a grab bar. Keep electrical cords out of the way. Do not use floor polish or wax that makes floors slippery. If you must use wax, use non-skid floor wax. Do not have throw rugs and other things on the floor  that can make you trip. What can I do with my stairs? Do not leave any items on the stairs. Make sure that there are handrails on both sides of the stairs and use them. Fix handrails that are broken or loose. Make sure that handrails are as long as the stairways. Check any carpeting to make sure that it is firmly attached to the stairs. Fix any carpet that is loose or worn. Avoid having throw rugs at the top or bottom of the stairs. If you do have throw rugs, attach them to the floor with carpet tape. Make sure that you have a light switch at the top of the stairs and the bottom of the stairs. If you do not have them, ask someone to add them for you. What else can I do to help prevent falls? Wear shoes that: Do not have high heels. Have rubber bottoms. Are comfortable and fit you well. Are closed at the toe. Do not wear sandals. If you use a stepladder: Make sure that it is fully opened. Do not climb a closed stepladder. Make sure that both sides of the stepladder are locked into place. Ask someone to hold it for you, if possible. Clearly mark and make sure that you can see: Any grab bars or handrails. First and last steps. Where the edge of each step is. Use tools that help you move around (mobility aids) if they are needed. These include: Canes. Walkers. Scooters. Crutches. Turn on the lights when you go into a dark area. Replace any light bulbs as soon as they burn out. Set up your furniture so you have a clear path. Avoid moving your furniture around. If any of your floors are uneven, fix them. If there are any pets around you, be aware of where they are. Review your medicines with your doctor. Some medicines can make you feel dizzy. This can increase your chance of falling. Ask your doctor what other things that you can do to help prevent falls. This information is not intended to replace advice given to you by your health care provider. Make sure you discuss any questions you  have with your health care provider. Document Released: 03/14/2009 Document Revised: 10/24/2015 Document Reviewed: 06/22/2014 Elsevier Interactive Patient Education  2017 [employer].

## 2022-07-24 ENCOUNTER — Telehealth: Payer: Self-pay | Admitting: *Deleted

## 2022-07-24 NOTE — Progress Notes (Unsigned)
  Care Coordination  Outreach Note  07/24/2022 Name: NILEY ASSENMACHER MRN: [id number] DOB: 01/06/1961   Care Coordination Outreach Attempts: An unsuccessful telephone outreach was attempted today to offer the patient information about available care coordination services as a benefit of their health plan.   Referral received   Follow Up Plan:  Additional outreach attempts will be made to offer the patient care coordination information and services.   Encounter Outcome:  No Answer  Julian Hy, [place] Direct Dial: [phone]

## 2022-07-27 NOTE — Progress Notes (Signed)
  Care Coordination   Note   07/27/2022 Name: Alexa Woods MRN: [id number] DOB: 1960-06-06  Alexa Woods is a 62 y.o. year old female who sees Tower, Wynelle Fanny, MD for primary care. I reached out to [name] by phone today to offer care coordination services.  [name] was given information about Care Coordination services today including:   The Care Coordination services include support from the care team which includes your Nurse Coordinator, Clinical Social Worker, or Pharmacist.  The Care Coordination team is here to help remove barriers to the health concerns and goals most important to you. Care Coordination services are voluntary, and the patient may decline or stop services at any time by request to their care team member.   Care Coordination Consent Status: Patient agreed to services and verbal consent obtained.   Follow up plan:  Telephone appointment with care coordination team member scheduled for:  07/30/2022  Encounter Outcome:  Pt. Scheduled from referral   Julian Hy, [place] Direct Dial: [phone]

## 2022-07-28 NOTE — Telephone Encounter (Signed)
Patient has an appointment with Dr. Glori Bickers at the same time. Will need to reschedule social work appointment. Please contact the patient

## 2022-07-29 ENCOUNTER — Encounter: Payer: Self-pay | Admitting: Family Medicine

## 2022-07-29 ENCOUNTER — Ambulatory Visit (INDEPENDENT_AMBULATORY_CARE_PROVIDER_SITE_OTHER): Payer: Medicare HMO | Admitting: Family Medicine

## 2022-07-29 VITALS — BP 132/70 | HR 70 | Temp 97.7°F | Ht 65.5 in | Wt 252.2 lb

## 2022-07-29 DIAGNOSIS — E1169 Type 2 diabetes mellitus with other specified complication: Secondary | ICD-10-CM

## 2022-07-29 DIAGNOSIS — E785 Hyperlipidemia, unspecified: Secondary | ICD-10-CM

## 2022-07-29 DIAGNOSIS — K219 Gastro-esophageal reflux disease without esophagitis: Secondary | ICD-10-CM

## 2022-07-29 DIAGNOSIS — I10 Essential (primary) hypertension: Secondary | ICD-10-CM | POA: Diagnosis not present

## 2022-07-29 DIAGNOSIS — Z0000 Encounter for general adult medical examination without abnormal findings: Secondary | ICD-10-CM

## 2022-07-29 DIAGNOSIS — Z79899 Other long term (current) drug therapy: Secondary | ICD-10-CM

## 2022-07-29 DIAGNOSIS — G720 Drug-induced myopathy: Secondary | ICD-10-CM

## 2022-07-29 DIAGNOSIS — E559 Vitamin D deficiency, unspecified: Secondary | ICD-10-CM | POA: Diagnosis not present

## 2022-07-29 DIAGNOSIS — T466X5A Adverse effect of antihyperlipidemic and antiarteriosclerotic drugs, initial encounter: Secondary | ICD-10-CM | POA: Insufficient documentation

## 2022-07-29 DIAGNOSIS — I700 Atherosclerosis of aorta: Secondary | ICD-10-CM

## 2022-07-29 DIAGNOSIS — F418 Other specified anxiety disorders: Secondary | ICD-10-CM | POA: Diagnosis not present

## 2022-07-29 DIAGNOSIS — G894 Chronic pain syndrome: Secondary | ICD-10-CM

## 2022-07-29 DIAGNOSIS — Z1231 Encounter for screening mammogram for malignant neoplasm of breast: Secondary | ICD-10-CM

## 2022-07-29 DIAGNOSIS — L299 Pruritus, unspecified: Secondary | ICD-10-CM | POA: Insufficient documentation

## 2022-07-29 DIAGNOSIS — L304 Erythema intertrigo: Secondary | ICD-10-CM

## 2022-07-29 DIAGNOSIS — E039 Hypothyroidism, unspecified: Secondary | ICD-10-CM | POA: Diagnosis not present

## 2022-07-29 DIAGNOSIS — E1142 Type 2 diabetes mellitus with diabetic polyneuropathy: Secondary | ICD-10-CM

## 2022-07-29 MED ORDER — EZETIMIBE 10 MG PO TABS: 10.0000 mg | ORAL_TABLET | Freq: Every day | ORAL | 3 refills | Status: DC

## 2022-07-29 NOTE — Assessment & Plan Note (Signed)
None on exam today but pt is prone to this

## 2022-07-29 NOTE — Assessment & Plan Note (Signed)
Lab Results  Component Value Date   VITAMINB12 331 07/22/2022

## 2022-07-29 NOTE — Assessment & Plan Note (Signed)
bp in fair control at this time  BP Readings from Last 1 Encounters:  07/29/22 132/70   No changes needed Most recent labs reviewed  Disc lifstyle change with low sodium diet and exercise  Stable with hctz 25 mg daily  Sees endo for dm2

## 2022-07-29 NOTE — Assessment & Plan Note (Signed)
In diabetic with HTN Statin intolerant- will try zetia for cholesterol Bp in control  No clinical changes

## 2022-07-29 NOTE — Assessment & Plan Note (Signed)
In a diabetic Cannot take statin medicines Will try zetia

## 2022-07-29 NOTE — Assessment & Plan Note (Signed)
On chronic oxycodone from pain provider

## 2022-07-29 NOTE — Assessment & Plan Note (Signed)
Hypothyroidism  Pt has no clinical changes No change in energy level/ hair or skin/ edema and no tremor Lab Results  Component Value Date   TSH 7.09 (H) 07/22/2022    Pt would like to check in with endocrinologist before treating this

## 2022-07-29 NOTE — Assessment & Plan Note (Signed)
Under care of endocrinologist Not well controlled Pt notes great difficulty adhering to low glycemic diet

## 2022-07-29 NOTE — Assessment & Plan Note (Signed)
Itching often in warm areas of skin  No rash or hives today  Inst to avoid hot conditions/ harsh detergents  and fragrances  Recommend all scent free products  Avoid fabric softener F/u if not improved

## 2022-07-29 NOTE — Assessment & Plan Note (Signed)
Under psychiatric care Sub optimal situation at home  Reviewed stressors/ coping techniques/symptoms/ support sources/ tx options and side effects in detail today

## 2022-07-29 NOTE — Patient Instructions (Addendum)
Chair yoga is a good option for exercise  Look it up on u tube   You need your eye exam  I put the referral in  Please let us know if you don't hear in 1-2 weeks   I also ordered mammogram with the mobile unit  If you don't get a call let us know    I will send thyroid report to your endocrinologist   For diabetes Try to get most of your carbohydrates from produce (with the exception of white potatoes)  Eat less bread/pasta/rice/snack foods/cereals/sweets and other items from the middle of the grocery store (processed carbs)  Take at least 2000 iu of vitamin D per day   Since you can't take the statin medicines try generic zetia 10 mg daily for cholesterol  Watch your diet for fatty foods also  Schedule fasting labs for cholesterol in 2-3 months   For itchy skin use  Scent free detergents Avoid fabric softener completely  Use dove soap for sensitive skin  Scent free lotions

## 2022-07-29 NOTE — Progress Notes (Signed)
Subjective:    Patient ID: Alexa Woods, female    DOB: July 31, 1960, 62 y.o.   MRN: [id number]  HPI Here for health maintenance exam and to review chronic medical problems    Wt Readings from Last 3 Encounters:  07/29/22 252 lb 3.2 oz (114.4 kg)  07/23/22 249 lb (112.9 kg)  07/06/22 249 lb (112.9 kg)   41.33 kg/m  Has terrible chronic pain  Tries to walk for exercise but cannot go very long  Has sciatic pain from it   Has surgery planned for L 3rd trigger finger    Vitals:   07/29/22 1432  BP: 132/70  Pulse: 70  Temp: 97.7 F (36.5 C)  SpO2: 96%     Immunization History  Administered Date(s) Administered   Influenza Inj Mdck Quad Pf 05/06/2018   Influenza Split 02/20/2011, 04/19/2012   Influenza Whole 04/02/2006, 06/19/2009   Influenza,inj,Quad PF,6+ Mos 04/02/2014, 08/02/2015, 04/07/2016, 04/05/2017   Influenza,inj,quad, With Preservative 04/01/2017   Influenza-Unspecified 02/07/2019   Pneumococcal Polysaccharide-23 06/29/2007, 08/02/2015   Td 02/18/2003   Td (Adult), 2 Lf Tetanus Toxid, Preservative Free 02/18/2003   Tdap 08/22/2018   Health Maintenance Due  Topic Date Due   COVID-19 Vaccine (1) Never done   Zoster Vaccines- Shingrix (1 of 2) Never done   Diabetic kidney evaluation - Urine ACR  07/27/2020   PAP SMEAR-Modifier  04/02/2022   OPHTHALMOLOGY EXAM  05/15/2022   Shingrix: declines   Pap 04/2019 Negative  Past hysterectomy    Eye exam: about a year ago  Needs referral to [facility] 09/2020- at the mobile unit  Self breast exam  : dense tissue  Has had nipple d/c in the past    Colonoscopy 11/2021   Mood    07/29/2022    3:30 PM 07/23/2022    2:40 PM 08/11/2021    2:32 PM 11/08/2020    4:47 PM 09/05/2019    4:28 PM  Depression screen PHQ 2/9  Decreased Interest 1 0 0 3 0  Down, Depressed, Hopeless 3 1 0 3 0  PHQ - 2 Score 4 1 0 6 0  Altered sleeping '1 3  3 1  '$ Tired, decreased energy '1 3  3 1  '$ Change in appetite  '2 3  3 1  '$ Feeling bad or failure about yourself  '3 3  3 '$ 0  Trouble concentrating '1 2  2 '$ 0  Moving slowly or fidgety/restless 0 0  2 0  Suicidal thoughts 1 0  1 0  PHQ-9 Score '13 15  23 3  '$ Difficult doing work/chores Somewhat difficult      Under psychiatric care    Some dry itchy skin on back  Hard to get lotion on   ? Yeast in groin area  Not active today  HTN bp is stable today  No cp or palpitations or headaches or edema  No side effects to medicines  BP Readings from Last 3 Encounters:  07/29/22 132/70  07/06/22 100/68  04/10/22 136/68     Hctz 25 mg daily   Lab Results  Component Value Date   CREATININE 0.97 07/22/2022   BUN 21 07/22/2022   NA 139 07/22/2022   K 3.9 07/22/2022   CL 100 07/22/2022   CO2 30 07/22/2022   GFR is 64.03    Takes oxycodone for chronic pain from pain clinic  Has narcan   Hypothyroidism  Pt has no clinical changes No change in energy level/  hair or skin/ edema and no tremor Lab Results  Component Value Date   TSH 7.09 (H) 07/22/2022    Levothyroxine 88 mcg daily in the past/no longer takes   Per pt- her endocrinologist did not want her on thyroid medicine in the past     GERD Protonix 40 mg bid  Has had egd/under care of GI  Lab Results  Component Value Date   VITAMINB12 331 07/22/2022     D level 18.9  Low  She ran out of vitamin D recently- then Dr Darnell Level told her to get more  Has 1000 iu tabs - has not started    DM2 Sees endo Not well controlled  Has been careless about her diet  Some highs and lows   Struggles to loose weight   Hyperlipidemia Lab Results  Component Value Date   CHOL 206 (H) 07/22/2022   CHOL 189 02/10/2022   CHOL 135 12/19/2020   Lab Results  Component Value Date   HDL 67.00 07/22/2022   HDL 61.30 02/10/2022   HDL 74.20 12/19/2020   Lab Results  Component Value Date   LDLCALC 106 (H) 07/22/2022   LDLCALC 106 (H) 02/10/2022   LDLCALC 44 12/19/2020   Lab Results  Component  Value Date   TRIG 166.0 (H) 07/22/2022   TRIG 112.0 02/10/2022   TRIG 81.0 12/19/2020   Lab Results  Component Value Date   CHOLHDL 3 07/22/2022   CHOLHDL 3 02/10/2022   CHOLHDL 2 12/19/2020   Lab Results  Component Value Date   LDLDIRECT 97.0 11/06/2020   LDLDIRECT 126.8 09/23/2012  Did not tolerate low dose statin /crestor 5   Lab Results  Component Value Date   WBC 7.1 07/22/2022   HGB 12.8 07/22/2022   HCT 38.0 07/22/2022   MCV 83.7 07/22/2022   PLT 220.0 07/22/2022  .lastriaon  Lab Results  Component Value Date   ALT 26 07/22/2022   AST 20 07/22/2022   ALKPHOS 145 (H) 07/22/2022   BILITOT 0.3 07/22/2022     Patient Active Problem List   Diagnosis Date Noted   Statin myopathy 07/29/2022   Itching 07/29/2022   Chronic pain 02/10/2022   Transportation insecurity 05/09/2021   Financial insecurity 05/09/2021   Colon cancer screening 11/08/2020   Current use of proton pump inhibitor 11/04/2020   History of total knee replacement, right 10/21/2020   Trigger finger of right hand 12/20/2019   Hydrocephalus ([place]) 09/05/2019   Dry eyes 09/05/2019   Intractable headache 08/25/2019   Hypothyroidism 04/04/2019   Screening mammogram, encounter for 04/03/2019   Routine general medical examination at a health care facility 04/03/2019   Encounter for screening for HIV 04/03/2019   Vitamin D deficiency 04/03/2019   Intertrigo 02/21/2019   Chronic constipation 12/26/2017   History of left knee replacement 12/26/2017   Primary osteoarthritis involving multiple joints 12/13/2017   Primary osteoarthritis of left knee 12/06/2017   Chronic neck pain 08/13/2017   Pain in left knee 06/22/2017   Morbid obesity ([place]) 01/21/2017   Joint swelling 12/20/2013   Periodontal disease 02/03/2013   Hyperlipidemia associated with type 2 diabetes mellitus ([place]) 09/27/2012   Low back pain 07/22/2010   Hypokalemia 12/12/2008   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   Type 2 diabetes  mellitus with peripheral neuropathy ([place]) 11/30/2006   History of alcohol abuse 11/30/2006   Depression with anxiety 11/30/2006   Essential hypertension 11/30/2006   HEMORRHOIDS 11/30/2006   Allergic rhinitis 11/30/2006   Asthma 11/30/2006  GERD without esophagitis 11/30/2006   Psoriasis 11/30/2006   Past Medical History:  Diagnosis Date   Alcohol abuse, in remission    since 1998   Allergic rhinitis    Anemia    Anxiety    Asthma    Bilateral lower extremity edema    Bulging lumbar disc    Bulging of cervical intervertebral disc    Chronic constipation    DDD (degenerative disc disease), lumbosacral    Depression    Dyspnea    Gastroparesis    GERD (gastroesophageal reflux disease)    Hemorrhoids    History of Bell's palsy 08/2007   left   History of chronic cystitis    IBS (irritable bowel syndrome)    Insulin dependent type 2 diabetes mellitus [facility])    endocrinologist-- dr Cruzita Lederer   Lower urinary tract symptoms (LUTS)    OA (osteoarthritis)    knees, back, hands, elbows   OSA (obstructive sleep apnea)    per study 06-08-2017 mild osa , cpap recommended , per pt insurance issue   Peripheral neuropathy    PONV (postoperative nausea and vomiting)    Psoriasis    S/P dilatation of esophageal stricture    Unspecified essential hypertension    Wears partial dentures    lower   Past Surgical History:  Procedure Laterality Date   CHOLECYSTECTOMY OPEN  1990   AND APPENDECTOMY   DOBUTAMINE STRESS ECHO  2009    normal stress echo, no evidence of ischemia   ELBOW SURGERY Bilateral RIGHT 2013;  LEFT 2016   for nerve damage   ESOPHAGOGASTRODUODENOSCOPY  07/2002   erythematous gastropathy   eye sugery Bilateral    KNEE ARTHROSCOPY Left 11/ 2018   dr Wynelle Link  @ [facility]   KNEE ARTHROSCOPY W/ PARTIAL MEDIAL MENISCECTOMY Right 10/2008   and chondroplasty   SHOULDER ARTHROSCOPY WITH DISTAL CLAVICLE RESECTION Left 12/ 2011   dr dean   TOTAL KNEE ARTHROPLASTY Right 07-01-2009   dr Wynelle Link   [facility]   TOTAL KNEE ARTHROPLASTY Left 12/06/2017   Procedure: LEFT TOTAL LEFT KNEE ARTHROPLASTY;  Surgeon: Gaynelle Arabian, MD;  Location: [facility];  Service: Orthopedics;  Laterality: Left;   VAGINAL HYSTERECTOMY  2003   Social History   Tobacco Use   Smoking status: Former    Years: 25.00    Types: Cigarettes    Quit date: 12/30/2001    Years since quitting: 20.5   Smokeless tobacco: Never  Vaping Use   Vaping Use: Never used  Substance Use Topics   Alcohol use: No    Alcohol/week: 0.0 standard drinks of alcohol    Comment: hx alcoholism--  stopped 1998    Drug use: No   Family History  Problem Relation Age of Onset   Alcohol abuse Mother    Hypertension Mother    Diabetes Mother    Heart failure Mother    Diabetes Sister    Heart attack Sister    Diabetes Sister    Diabetes Sister    Diabetes Sister    Diabetes Brother    Parkinson's disease Brother    Heart attack Brother    Heart attack Brother 44   Diabetes Brother    Diabetes Brother    Diabetes Brother    Esophageal cancer Maternal Grandmother    Lung cancer Other        mat great uncle   Celiac disease Daughter    Thyroid disease Niece    Colon cancer Neg Hx  Allergies  Allergen Reactions   Bupropion Hcl Other (See Comments)    Pt is unsure   Cefuroxime Axetil Nausea Only   Crestor [Rosuvastatin Calcium]     Pt states it makes her feel weird    Gabapentin Other (See Comments)    Pt does not remember reaction  Pt does not remember reaction    Lidocaine Other (See Comments)    REACTION: unknown REACTION: unknown   Metformin Other (See Comments)    REACTION: GI REACTION: GI   Other Other (See Comments)   Paroxetine Other (See Comments)    REACTION: doesn't agree REACTION: doesn't agree   Propoxyphene Other (See Comments)    wheezing wheezing   Tramadol Other (See Comments)    REACTION: Causes Anxiety   Tramadol Hcl     REACTION: Causes Anxiety   Wellbutrin [Bupropion] Other (See  Comments)    Pt is unsure   Codeine Nausea And Vomiting and Rash   Sulfa Antibiotics Rash and Other (See Comments)   Current Outpatient Medications on File Prior to Visit  Medication Sig Dispense Refill   ACCU-CHEK GUIDE test strip USE TO TEST BLOOD SUGAR 4 TIMES DAILY AS INSTRUCTED. 400 strip 0   albuterol (VENTOLIN HFA) 108 (90 Base) MCG/ACT inhaler Inhale 2 puffs into the lungs every 4 (four) hours as needed for wheezing or shortness of breath. 18 each 3   BD INSULIN SYRINGE U/F 31G X 5/16" 0.5 ML MISC USE THREE TIMES PER DAY WITH R INSULIN & ONCE DAILY WITH LEVEMIR 400 each 2   cholecalciferol (VITAMIN D3) 25 MCG (1000 UNIT) tablet Take 2,000 Units by mouth daily.     Continuous Blood Gluc Receiver (DEXCOM G6 RECEIVER) DEVI Use as directed to check blood sugar. 1 each 0   Continuous Blood Gluc Sensor (DEXCOM G6 SENSOR) MISC Use as directed to check blood sugar. change every 10 days 9 each 3   docusate sodium (COLACE) 100 MG capsule Take 100 mg by mouth every morning.     fluticasone (FLONASE) 50 MCG/ACT nasal spray Place 2 sprays into both nostrils daily as needed for allergies or rhinitis. 16 g 5   hydrochlorothiazide (HYDRODIURIL) 25 MG tablet TAKE 1 TABLET EVERY DAY 90 tablet 1   insulin degludec (TRESIBA FLEXTOUCH) 200 UNIT/ML FlexTouch Pen Inject 30-34 Units into the skin daily. 18 mL 3   Insulin Pen Needle (B-D UF III MINI PEN NEEDLES) 31G X 5 MM MISC USE AS INSTRUCTED FOR 4X DAILY INJECTIONS 400 each 1   Insulin Pen Needle 32G X 4 MM MISC Use 4x a day 300 each 3   Insulin Regular Human (NOVOLIN R FLEXPEN RELION) 100 UNIT/ML KwikPen Inject 12 to 18 units under skin before each meal 45 mL 3   Lancets (ONETOUCH ULTRASOFT) lancets Use to test blood sugar 4 times daily as instructed. 200 each 11   LORazepam (ATIVAN) 2 MG tablet Take 2 mg by mouth 2 (two) times daily.     naloxegol oxalate (MOVANTIK) 25 MG TABS tablet Take 1 tablet (25 mg total) by mouth daily. 30 tablet 3   naloxone  (NARCAN) 4 MG/0.1ML LIQD nasal spray kit      ondansetron (ZOFRAN) 4 MG tablet      oxyCODONE-acetaminophen (PERCOCET) 10-325 MG tablet Take 1 tablet by mouth every 4 (four) hours as needed for pain. Hold for SBP < = 105 12 tablet 0   pantoprazole (PROTONIX) 40 MG tablet TAKE 1 TABLET TWICE DAILY 180 tablet 1  potassium chloride (KLOR-CON M) 10 MEQ tablet TAKE 1 TABLET EVERY DAY 90 tablet 1   sertraline (ZOLOFT) 100 MG tablet Take 200 mg by mouth every morning.     tiZANidine (ZANAFLEX) 4 MG tablet Take 4 mg by mouth 2 (two) times daily as needed for muscle spasms.      triamcinolone cream (KENALOG) 0.1 % Apply 1 application topically 2 (two) times daily. To psoriasis areas 30 g 0   zolpidem (AMBIEN) 10 MG tablet Take 10 mg by mouth at bedtime as needed for sleep.     No current facility-administered medications on file prior to visit.    Review of Systems  Constitutional:  Positive for fatigue. Negative for activity change, appetite change, fever and unexpected weight change.  HENT:  Negative for congestion, ear pain, rhinorrhea, sinus pressure and sore throat.   Eyes:  Negative for pain, redness and visual disturbance.  Respiratory:  Negative for cough, shortness of breath and wheezing.   Cardiovascular:  Negative for chest pain and palpitations.  Gastrointestinal:  Negative for abdominal pain, blood in stool, constipation and diarrhea.       Chronic dyspepsia  Endocrine: Negative for polydipsia and polyuria.  Genitourinary:  Positive for frequency. Negative for dysuria and urgency.  Musculoskeletal:  Positive for arthralgias, back pain and myalgias.  Skin:  Negative for pallor and rash.       Psoriasis   Chronic itching  Allergic/Immunologic: Negative for environmental allergies.  Neurological:  Negative for dizziness, syncope and headaches.  Hematological:  Negative for adenopathy. Does not bruise/bleed easily.  Psychiatric/Behavioral:  Positive for dysphoric mood. Negative for  decreased concentration. The patient is nervous/anxious.        Objective:   Physical Exam Constitutional:      General: She is not in acute distress.    Appearance: Normal appearance. She is well-developed. She is obese. She is not ill-appearing or diaphoretic.  HENT:     Head: Normocephalic and atraumatic.     Right Ear: Tympanic membrane, ear canal and external ear normal.     Left Ear: Tympanic membrane, ear canal and external ear normal.     Nose: Nose normal. No congestion.     Mouth/Throat:     Mouth: Mucous membranes are moist.     Pharynx: Oropharynx is clear. No posterior oropharyngeal erythema.  Eyes:     General: No scleral icterus.    Extraocular Movements: Extraocular movements intact.     Conjunctiva/sclera: Conjunctivae normal.     Pupils: Pupils are equal, round, and reactive to light.  Neck:     Thyroid: No thyromegaly.     Vascular: No carotid bruit or JVD.  Cardiovascular:     Rate and Rhythm: Normal rate and regular rhythm.     Pulses: Normal pulses.     Heart sounds: Normal heart sounds.     No gallop.  Pulmonary:     Effort: Pulmonary effort is normal. No respiratory distress.     Breath sounds: Normal breath sounds. No wheezing.     Comments: Good air exch Chest:     Chest wall: No tenderness.  Abdominal:     General: Bowel sounds are normal. There is no distension or abdominal bruit.     Palpations: Abdomen is soft. There is no mass.     Tenderness: There is no abdominal tenderness.     Hernia: No hernia is present.  Genitourinary:    Comments: Breast exam: No mass, nodules, thickening, tenderness, bulging, retraction,  inflamation, nipple discharge or skin changes noted.  No axillary or clavicular LA.     Musculoskeletal:        General: No tenderness. Normal range of motion.     Cervical back: Normal range of motion and neck supple. No rigidity. No muscular tenderness.     Right lower leg: No edema.     Left lower leg: No edema.     Comments:  No kyphosis   Lymphadenopathy:     Cervical: No cervical adenopathy.  Skin:    General: Skin is warm and dry.     Coloration: Skin is not pale.     Findings: No erythema or rash.     Comments: Solar lentigines diffusely Some areas of psoriasis on head/ hands / back   No hives or new rash No intertrigo today  Neurological:     Mental Status: She is alert. Mental status is at baseline.     Cranial Nerves: No cranial nerve deficit.     Motor: No abnormal muscle tone.     Coordination: Coordination normal.     Gait: Gait normal.     Deep Tendon Reflexes: Reflexes are normal and symmetric. Reflexes normal.  Psychiatric:        Attention and Perception: Attention normal.        Mood and Affect: Mood is depressed. Mood is not anxious.        Speech: Speech normal.        Cognition and Memory: Cognition and memory normal.     Comments: Candidly discusses symptoms and stressors             Assessment & Plan:   Problem List Items Addressed This Visit       Cardiovascular and Mediastinum   Essential hypertension    bp in fair control at this time  BP Readings from Last 1 Encounters:  07/29/22 132/70  No changes needed Most recent labs reviewed  Disc lifstyle change with low sodium diet and exercise  Stable with hctz 25 mg daily  Sees endo for dm2        Relevant Medications   ezetimibe (ZETIA) 10 MG tablet     Digestive   GERD without esophagitis    Under care of GI Protonix 40 mg bid   B12 is in the normal range        Endocrine   Hyperlipidemia associated with type 2 diabetes mellitus ([place])    Disc goals for lipids and reasons to control them Rev last labs with pt Rev low sat fat diet in detail  Took crestor 5 mg briefly and did not continue it due to myalgias  Will try zetia 10 mg daily  Re check lab in 2-3 mo Inst to alert if any side eff      Relevant Medications   ezetimibe (ZETIA) 10 MG tablet   Hypothyroidism    Hypothyroidism  Pt has no  clinical changes No change in energy level/ hair or skin/ edema and no tremor Lab Results  Component Value Date   TSH 7.09 (H) 07/22/2022    Pt would like to check in with endocrinologist before treating this       Type 2 diabetes mellitus with peripheral neuropathy ([place])    Under care of endocrinologist Not well controlled Pt notes great difficulty adhering to low glycemic diet       Relevant Orders   Ambulatory referral to Ophthalmology     Musculoskeletal and Integument  Intertrigo    None on exam today but pt is prone to this       Statin myopathy    In a diabetic Cannot take statin medicines Will try zetia         Other   Chronic pain    On chronic oxycodone from pain provider      Current use of proton pump inhibitor    Lab Results  Component Value Date   VITAMINB12 331 07/22/2022        Depression with anxiety    Under psychiatric care Sub optimal situation at home  Reviewed stressors/ coping techniques/symptoms/ support sources/ tx options and side effects in detail today       Itching    Itching often in warm areas of skin  No rash or hives today  Inst to avoid hot conditions/ harsh detergents  and fragrances  Recommend all scent free products  Avoid fabric softener F/u if not improved         Morbid obesity ([place])   Routine general medical examination at a health care facility - Primary    Reviewed health habits including diet and exercise and skin cancer prevention Reviewed appropriate screening tests for age  Also reviewed health mt list, fam hx and immunization status , as well as social and family history   See HPI Labs reviewed  Mammogram ordered-pt will schedule Declines shingrix  No pap 2nd to past hysterectomy Ref done for annual dm eye exam [place] utd 11/2021        Screening mammogram, encounter for   Relevant Orders   MM 3D SCREEN BREAST BILATERAL   Vitamin D deficiency    D level is low  Enc pt to start back on at  least 2000 iu daily  Disc imp to bone and overall health

## 2022-07-29 NOTE — Assessment & Plan Note (Signed)
Reviewed health habits including diet and exercise and skin cancer prevention Reviewed appropriate screening tests for age  Also reviewed health mt list, fam hx and immunization status , as well as social and family history   See HPI Labs reviewed  Mammogram ordered-pt will schedule Declines shingrix  No pap 2nd to past hysterectomy Ref done for annual dm eye exam [place] utd 11/2021

## 2022-07-29 NOTE — Assessment & Plan Note (Signed)
D level is low  Enc pt to start back on at least 2000 iu daily  Disc imp to bone and overall health

## 2022-07-29 NOTE — Assessment & Plan Note (Signed)
Under care of GI Protonix 40 mg bid   B12 is in the normal range

## 2022-07-29 NOTE — Assessment & Plan Note (Signed)
Disc goals for lipids and reasons to control them Rev last labs with pt Rev low sat fat diet in detail  Took crestor 5 mg briefly and did not continue it due to myalgias  Will try zetia 10 mg daily  Re check lab in 2-3 mo Inst to alert if any side eff

## 2022-07-30 ENCOUNTER — Encounter: Payer: BC Managed Care – PPO | Admitting: *Deleted

## 2022-07-30 MED ORDER — LEVOTHYROXINE SODIUM 50 MCG PO TABS: 50.0000 ug | ORAL_TABLET | Freq: Every day | ORAL | 3 refills | Status: DC

## 2022-07-30 NOTE — Addendum Note (Signed)
Addended by: Loura Pardon A on: 07/30/2022 06:47 PM   Modules accepted: Orders

## 2022-07-30 NOTE — Progress Notes (Signed)
Please let pt know that I checked in with Dr Cruzita Lederer and decided to treat her hypothyroidism I sent 50 mcg levothyroxine to pharmacy Take it first thing in am with water 30 min before food/meds or supplements  Re check tsh in about 6 weeks  Thanks

## 2022-07-31 ENCOUNTER — Telehealth: Payer: Self-pay | Admitting: *Deleted

## 2022-07-31 DIAGNOSIS — M65332 Trigger finger, left middle finger: Secondary | ICD-10-CM | POA: Diagnosis not present

## 2022-07-31 HISTORY — PX: OTHER SURGICAL HISTORY: SHX169

## 2022-07-31 NOTE — Telephone Encounter (Addendum)
-----   Message from Abner Greenspan, MD sent at 07/30/2022  6:47 PM EST -----   Please let pt know that I checked in with Dr Cruzita Lederer and decided to treat her hypothyroidism I sent 50 mcg levothyroxine to pharmacy Take it first thing in am with water 30 min before food/meds or supplements  Re check tsh in about 6 weeks  Thanks     ----- Message ----- From: [name], MD Sent: 07/30/2022  12:46 PM EST To: Abner Greenspan, MD  Roque Lias, I got your message about her high TSH.  I have not been managing this for her and I do not remember telling her that I did not want her on levothyroxine... (Per review of the last note). We could restart low-dose levothyroxine, 50 mcg daily, for example.  There is also an option of waiting and rechecking the thyroid test in approximately 2 months, while, ideally, she would start losing weight. Sincerely, Salena Saner  ----- Message ----- From: Abner Greenspan, MD Sent: 07/29/2022   7:12 PM EST To: [name], MD  Lab Results      Component                Value               Date                      TSH                      7.09 (H)            07/22/2022           Pt wanted your input before treating, thanks

## 2022-07-31 NOTE — Telephone Encounter (Signed)
Left VM requesting pt to call the office back 

## 2022-07-31 NOTE — Telephone Encounter (Signed)
Pt notified of Dr. Marliss Coots comments and will stat med. Pt already had a fasting lab appt scheduled to recheck cholesterol so will check TSH then

## 2022-08-05 ENCOUNTER — Ambulatory Visit: Payer: Self-pay | Admitting: *Deleted

## 2022-08-05 ENCOUNTER — Telehealth: Payer: Self-pay | Admitting: *Deleted

## 2022-08-05 NOTE — Patient Instructions (Signed)
Visit Information  Thank you for taking time to visit with me today. Please don't hesitate to contact me if I can be of assistance to you.   Following are the goals we discussed today:   Goals Addressed             This Visit's Progress    Managing depression       Care Coordination Interventions: CSW to refer patient to the Independent Living Program to assists with safety modifications in her bathroom Discussed self-care action plan: Patient to schedule follow up with mental health provider, identify family, friends, local [place] if needed for additional support and safety         Our next appointment is by telephone on 08/17/22 at 11am  Please call the care guide team at [phone] if you need to cancel or reschedule your appointment.   If you are experiencing a Mental Health or [place] or need someone to talk to, please call [phone]   Patient verbalizes understanding of instructions and care plan provided today and agrees to view in [place]. Active MyChart status and patient understanding of how to access instructions and care plan via MyChart confirmed with patient.     Telephone follow up appointment with care management team member scheduled for:08/17/22  Elliot Gurney, [place] Worker  [facility] Care Management [phone]

## 2022-08-05 NOTE — Patient Outreach (Signed)
  Care Coordination   08/05/2022 Name: Alexa Woods MRN: [id number] DOB: [birth year]   Care Coordination Outreach Attempts:  An unsuccessful telephone outreach was attempted for a scheduled appointment today.  Follow Up Plan:  Additional outreach attempts will be made to offer the patient care coordination information and services.   Encounter Outcome:  No Answer   Care Coordination Interventions:  No, not indicated    Jourdon Zimmerle, [place] Worker [facility] Care Management [phone]

## 2022-08-05 NOTE — Patient Outreach (Signed)
  Care Coordination   Initial Visit Note   08/05/2022 Name: Alexa Woods MRN: [id number] DOB: 1961-01-18  Alexa Woods is a 62 y.o. year old female who sees Tower, Wynelle Fanny, MD for primary care. I spoke with  [name] by phone today.  What matters to the patients health and wellness today?  Managing depression related to relationship conflicts   Goals Addressed             This Visit's Progress    Manaaging depression       Care Coordination Interventions: CSW to refer patient to the Independent Living Program to assists with safety modifications in her bathroom Discussed self-care action plan: Patient to schedule follow up with mental health provider, identify family, friends, local [place] if needed for additional support and safety         SDOH assessments and interventions completed:  Yes  SDOH Interventions Today    Flowsheet Row Most Recent Value  SDOH Interventions   Food Insecurity Interventions Intervention Not Indicated  Housing Interventions Intervention Not Indicated  Depression Interventions/Treatment  Counseling, Referral to Psychiatry        Care Coordination Interventions:  Yes, provided  Interventions Today    Flowsheet Row Most Recent Value  Chronic Disease   Chronic disease during today's visit Diabetes, Hypertension (HTN)  General Interventions   General Interventions Discussed/Reviewed General Interventions Discussed  Mental Health Interventions   Mental Health Discussed/Reviewed Mental Health Discussed, Coping Strategies  [Encouraged continued follow up with [facility] for ongoing therapy and medication management]  Safety Interventions   Safety Discussed/Reviewed Safety Discussed  [safety plan developed-discussed follow up with the [facility],  will refer to [place] for rails in the bathroom]       Follow up plan: Follow up call scheduled for 08/17/22    Encounter Outcome:  Pt. Visit  Completed

## 2022-08-07 ENCOUNTER — Telehealth: Payer: Self-pay | Admitting: *Deleted

## 2022-08-07 NOTE — Patient Instructions (Signed)
Visit Information  Thank you for taking time to visit with me today. Please don't hesitate to contact me if I can be of assistance to you.   Following are the goals we discussed today:   Goals Addressed             This Visit's Progress    Managing depression and home modifications in her home       Care Coordination Interventions: CSW referred patient to the Independent Living Program to assists with safety modifications in her bathroom Self-care action plan reinforced: Patient to schedule follow up with mental health provider, identify family, friends, local [place] if needed for additional support and safety         Our next appointment is by telephone on 08/17/22 at 11am  Please call the care guide team at [phone] if you need to cancel or reschedule your appointment.   If you are experiencing a Mental Health or [place] or need someone to talk to, please call 911   Patient verbalizes understanding of instructions and care plan provided today and agrees to view in [place]. Active MyChart status and patient understanding of how to access instructions and care plan via MyChart confirmed with patient.     Telephone follow up appointment with care management team member scheduled for: 08/17/22  Elliot Gurney, [place] Worker  [facility] Care Management [phone]

## 2022-08-07 NOTE — Patient Outreach (Signed)
  Care Coordination   Follow Up Visit Note   08/07/2022 Name: Alexa Woods MRN: [id number] DOB: 05-Jun-1960  Alexa Woods is a 62 y.o. year old female who sees Tower, Wynelle Fanny, MD for primary care. I spoke with  [name] by phone today.  What matters to the patients health and wellness today?  Mental health follow up and home modifications    Goals Addressed             This Visit's Progress    Managing depression and home modifications in her home       Care Coordination Interventions: CSW referred patient to the Independent Living Program to assists with safety modifications in her bathroom Self-care action plan reinforced: Patient to schedule follow up with mental health provider, identify family, friends, local [place] if needed for additional support and safety         SDOH assessments and interventions completed:  No     Care Coordination Interventions:  Yes, provided  Interventions Today    Flowsheet Row Most Recent Value  Chronic Disease   Chronic disease during today's visit Diabetes, Hypertension (HTN)  General Interventions   General Interventions Discussed/Reviewed General Interventions Reviewed, [employer], Communication with  Communication with --  [Vocational rehab-referral made to [place] to assist with grab bars in her bathroom-also contacted [facility] for their weatherization program to assist with mold issues in patient's bathroom]       Follow up plan: Follow up call scheduled for 08/17/22    Encounter Outcome:  Pt. Visit Completed

## 2022-08-11 DIAGNOSIS — R0781 Pleurodynia: Secondary | ICD-10-CM | POA: Diagnosis not present

## 2022-08-11 DIAGNOSIS — M65311 Trigger thumb, right thumb: Secondary | ICD-10-CM | POA: Diagnosis not present

## 2022-08-14 ENCOUNTER — Encounter: Payer: Self-pay | Admitting: Family Medicine

## 2022-08-14 ENCOUNTER — Ambulatory Visit (INDEPENDENT_AMBULATORY_CARE_PROVIDER_SITE_OTHER)
Admission: RE | Admit: 2022-08-14 | Discharge: 2022-08-14 | Disposition: A | Discharge status: Home / Self Care | Payer: BC Managed Care – PPO | Source: Ambulatory Visit | Attending: Family Medicine | Admitting: Family Medicine

## 2022-08-14 ENCOUNTER — Ambulatory Visit: Payer: BC Managed Care – PPO | Admitting: Family Medicine

## 2022-08-14 VITALS — BP 136/84 | HR 80 | Temp 97.8°F | Ht 65.5 in | Wt 257.0 lb

## 2022-08-14 DIAGNOSIS — R296 Repeated falls: Secondary | ICD-10-CM

## 2022-08-14 DIAGNOSIS — R0789 Other chest pain: Secondary | ICD-10-CM

## 2022-08-14 DIAGNOSIS — R0781 Pleurodynia: Secondary | ICD-10-CM | POA: Diagnosis not present

## 2022-08-14 MED ORDER — FLUCONAZOLE 150 MG PO TABS: 150.0000 mg | ORAL_TABLET | Freq: Once | ORAL | 0 refills | Status: AC

## 2022-08-14 NOTE — Patient Instructions (Addendum)
Use heat on your ribs/back for 10 minutes at a time  Grab a pillow for support and take a deep breath every 1/2 hour while awake  Xray now  We will call you about the result   If you get a fever or productive cough let us know   Do everything you can not to fall   Call your social worker back when you can regarding transportation    Let us know if you want a referral to neurology to discuss falls (Dr Manuella Ghazi)

## 2022-08-14 NOTE — Progress Notes (Unsigned)
Subjective:    Patient ID: Alexa Woods, female    DOB: 21-Jan-1961, 62 y.o.   MRN: [id number]  HPI Pt presents for rib pain after a fall   Mentions she has yeast infection -needs medicine   Wt Readings from Last 3 Encounters:  08/14/22 257 lb (116.6 kg)  07/29/22 252 lb 3.2 oz (114.4 kg)  07/23/22 249 lb (112.9 kg)   42.12 kg/m  Vitals:   08/14/22 1539  BP: 136/84  Pulse: 80  Temp: 97.8 F (36.6 C)  SpO2: 98%     Fell on Monday 3/11 Tripped over case of water in the dark  Thor on R side  Her glasses hit her in the forehead /bruise  R sided rib pain worse with breathing   [facility]  [facility]ray  In care every where I can see that xray was done but no attachement wit report but progress noted there were no fractures or dislocations   Using ice  As not tried heat yet    Is prone to falls   Tizanidine Oxycodone Ambien Lorazepam   .DG Ribs Unilateral W/Chest Right  Result Date: 08/14/2022 CLINICAL DATA:  [place] 08/10/2022, right-sided rib pain, pain with inspiration EXAM: RIGHT RIBS AND CHEST - 3+ VIEW COMPARISON:  12/06/2020 FINDINGS: Frontal view of the chest as well as frontal and oblique views of the right thoracic cage are obtained. The cardiac silhouette is unremarkable. No airspace disease, effusion, or pneumothorax. Evidence of a prior healed right anterolateral sixth rib fracture. No acute bony abnormalities. IMPRESSION: 1. No acute displaced fracture. 2. No acute intrathoracic process. Electronically Signed   By: Randa Ngo M.D.   On: 08/14/2022 16:34    Patient Active Problem List   Diagnosis Date Noted   Frequent falls 08/14/2022   Right-sided chest wall pain 08/14/2022   Statin myopathy 07/29/2022   Itching 07/29/2022   Aortic atherosclerosis ([place]) 07/29/2022   Chronic pain 02/10/2022   Transportation insecurity 05/09/2021   Financial insecurity 05/09/2021   Colon cancer screening 11/08/2020   Current use of proton pump  inhibitor 11/04/2020   History of total knee replacement, right 10/21/2020   Trigger finger of right hand 12/20/2019   Dry eyes 09/05/2019   Intractable headache 08/25/2019   Hypothyroidism 04/04/2019   Screening mammogram, encounter for 04/03/2019   Routine general medical examination at a health care facility 04/03/2019   Encounter for screening for HIV 04/03/2019   Vitamin D deficiency 04/03/2019   Intertrigo 02/21/2019   Chronic constipation 12/26/2017   History of left knee replacement 12/26/2017   Primary osteoarthritis involving multiple joints 12/13/2017   Primary osteoarthritis of left knee 12/06/2017   Chronic neck pain 08/13/2017   Pain in left knee 06/22/2017   Morbid obesity ([place]) 01/21/2017   Joint swelling 12/20/2013   Periodontal disease 02/03/2013   Hyperlipidemia associated with type 2 diabetes mellitus ([place]) 09/27/2012   Low back pain 07/22/2010   Hypokalemia 12/12/2008   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   Type 2 diabetes mellitus with peripheral neuropathy ([place]) 11/30/2006   History of alcohol abuse 11/30/2006   Depression with anxiety 11/30/2006   Essential hypertension 11/30/2006   HEMORRHOIDS 11/30/2006   Allergic rhinitis 11/30/2006   Asthma 11/30/2006   GERD without esophagitis 11/30/2006   Psoriasis 11/30/2006   Past Medical History:  Diagnosis Date   Alcohol abuse, in remission    since 1998   Allergic rhinitis    Anemia    Anxiety  Asthma    Bilateral lower extremity edema    Bulging lumbar disc    Bulging of cervical intervertebral disc    Chronic constipation    DDD (degenerative disc disease), lumbosacral    Depression    Dyspnea    Gastroparesis    GERD (gastroesophageal reflux disease)    Hemorrhoids    History of Bell's palsy 08/2007   left   History of chronic cystitis    IBS (irritable bowel syndrome)    Insulin dependent type 2 diabetes mellitus [facility])    endocrinologist-- dr Cruzita Lederer   Lower urinary tract symptoms  (LUTS)    OA (osteoarthritis)    knees, back, hands, elbows   OSA (obstructive sleep apnea)    per study 06-08-2017 mild osa , cpap recommended , per pt insurance issue   Peripheral neuropathy    PONV (postoperative nausea and vomiting)    Psoriasis    S/P dilatation of esophageal stricture    Unspecified essential hypertension    Wears partial dentures    lower   Past Surgical History:  Procedure Laterality Date   CHOLECYSTECTOMY OPEN  1990   AND APPENDECTOMY   DOBUTAMINE STRESS ECHO  2009    normal stress echo, no evidence of ischemia   ELBOW SURGERY Bilateral RIGHT 2013;  LEFT 2016   for nerve damage   ESOPHAGOGASTRODUODENOSCOPY  07/2002   erythematous gastropathy   eye sugery Bilateral    KNEE ARTHROSCOPY Left 11/ 2018   dr Wynelle Link  @ [facility]   KNEE ARTHROSCOPY W/ PARTIAL MEDIAL MENISCECTOMY Right 10/2008   and chondroplasty   SHOULDER ARTHROSCOPY WITH DISTAL CLAVICLE RESECTION Left 12/ 2011   dr dean   TOTAL KNEE ARTHROPLASTY Right 07-01-2009  dr Wynelle Link   [facility]   TOTAL KNEE ARTHROPLASTY Left 12/06/2017   Procedure: LEFT TOTAL LEFT KNEE ARTHROPLASTY;  Surgeon: Gaynelle Arabian, MD;  Location: [facility];  Service: Orthopedics;  Laterality: Left;   Trigger finger surgery Left 07/2022   VAGINAL HYSTERECTOMY  2003   Social History   Tobacco Use   Smoking status: Former    Years: 25    Types: Cigarettes    Quit date: 12/30/2001    Years since quitting: 20.6   Smokeless tobacco: Never  Vaping Use   Vaping Use: Never used  Substance Use Topics   Alcohol use: No    Alcohol/week: 0.0 standard drinks of alcohol    Comment: hx alcoholism--  stopped 1998    Drug use: No   Family History  Problem Relation Age of Onset   Alcohol abuse Mother    Hypertension Mother    Diabetes Mother    Heart failure Mother    Diabetes Sister    Heart attack Sister    Diabetes Sister    Diabetes Sister    Diabetes Sister    Diabetes Brother    Parkinson's disease Brother    Heart attack  Brother    Heart attack Brother 71   Diabetes Brother    Diabetes Brother    Diabetes Brother    Esophageal cancer Maternal Grandmother    Lung cancer Other        mat great uncle   Celiac disease Daughter    Thyroid disease Niece    Colon cancer Neg Hx    Allergies  Allergen Reactions   Bupropion Hcl Other (See Comments)    Pt is unsure   Cefuroxime Axetil Nausea Only   Crestor [Rosuvastatin Calcium]  Pt states it makes her feel weird    Gabapentin Other (See Comments)    Pt does not remember reaction  Pt does not remember reaction    Lidocaine Other (See Comments)    REACTION: unknown REACTION: unknown   Metformin Other (See Comments)    REACTION: GI REACTION: GI   Other Other (See Comments)   Paroxetine Other (See Comments)    REACTION: doesn't agree REACTION: doesn't agree   Propoxyphene Other (See Comments)    wheezing wheezing   Tramadol Other (See Comments)    REACTION: Causes Anxiety   Tramadol Hcl     REACTION: Causes Anxiety   Wellbutrin [Bupropion] Other (See Comments)    Pt is unsure   Codeine Nausea And Vomiting and Rash   Sulfa Antibiotics Rash and Other (See Comments)   Current Outpatient Medications on File Prior to Visit  Medication Sig Dispense Refill   ACCU-CHEK GUIDE test strip USE TO TEST BLOOD SUGAR 4 TIMES DAILY AS INSTRUCTED. 400 strip 0   albuterol (VENTOLIN HFA) 108 (90 Base) MCG/ACT inhaler Inhale 2 puffs into the lungs every 4 (four) hours as needed for wheezing or shortness of breath. 18 each 3   BD INSULIN SYRINGE U/F 31G X 5/16" 0.5 ML MISC USE THREE TIMES PER DAY WITH R INSULIN & ONCE DAILY WITH LEVEMIR 400 each 2   cholecalciferol (VITAMIN D3) 25 MCG (1000 UNIT) tablet Take 2,000 Units by mouth daily.     Continuous Blood Gluc Receiver (DEXCOM G6 RECEIVER) DEVI Use as directed to check blood sugar. 1 each 0   Continuous Blood Gluc Sensor (DEXCOM G6 SENSOR) MISC Use as directed to check blood sugar. change every 10 days 9 each 3    docusate sodium (COLACE) 100 MG capsule Take 100 mg by mouth every morning.     ezetimibe (ZETIA) 10 MG tablet Take 1 tablet (10 mg total) by mouth daily. 90 tablet 3   fluticasone (FLONASE) 50 MCG/ACT nasal spray Place 2 sprays into both nostrils daily as needed for allergies or rhinitis. 16 g 5   hydrochlorothiazide (HYDRODIURIL) 25 MG tablet TAKE 1 TABLET EVERY DAY 90 tablet 1   insulin degludec (TRESIBA FLEXTOUCH) 200 UNIT/ML FlexTouch Pen Inject 30-34 Units into the skin daily. 18 mL 3   Insulin Pen Needle (B-D UF III MINI PEN NEEDLES) 31G X 5 MM MISC USE AS INSTRUCTED FOR 4X DAILY INJECTIONS 400 each 1   Insulin Pen Needle 32G X 4 MM MISC Use 4x a day 300 each 3   Insulin Regular Human (NOVOLIN R FLEXPEN RELION) 100 UNIT/ML KwikPen Inject 12 to 18 units under skin before each meal 45 mL 3   Lancets (ONETOUCH ULTRASOFT) lancets Use to test blood sugar 4 times daily as instructed. 200 each 11   levothyroxine (SYNTHROID) 50 MCG tablet Take 1 tablet (50 mcg total) by mouth daily. 30 tablet 3   LORazepam (ATIVAN) 2 MG tablet Take 2 mg by mouth 2 (two) times daily.     naloxegol oxalate (MOVANTIK) 25 MG TABS tablet Take 1 tablet (25 mg total) by mouth daily. 30 tablet 3   naloxone (NARCAN) 4 MG/0.1ML LIQD nasal spray kit      ondansetron (ZOFRAN) 4 MG tablet      oxyCODONE-acetaminophen (PERCOCET) 10-325 MG tablet Take 1 tablet by mouth every 4 (four) hours as needed for pain. Hold for SBP < = 105 12 tablet 0   pantoprazole (PROTONIX) 40 MG tablet TAKE 1 TABLET TWICE DAILY  180 tablet 1   potassium chloride (KLOR-CON M) 10 MEQ tablet TAKE 1 TABLET EVERY DAY 90 tablet 1   sertraline (ZOLOFT) 100 MG tablet Take 200 mg by mouth every morning.     tiZANidine (ZANAFLEX) 4 MG tablet Take 4 mg by mouth 2 (two) times daily as needed for muscle spasms.      triamcinolone cream (KENALOG) 0.1 % Apply 1 application topically 2 (two) times daily. To psoriasis areas 30 g 0   zolpidem (AMBIEN) 10 MG tablet  Take 10 mg by mouth at bedtime as needed for sleep.     No current facility-administered medications on file prior to visit.    Review of Systems  Constitutional:  Positive for fatigue. Negative for activity change, appetite change, fever and unexpected weight change.  HENT:  Negative for congestion, ear pain, rhinorrhea, sinus pressure and sore throat.   Eyes:  Negative for pain, redness and visual disturbance.  Respiratory:  Negative for cough, shortness of breath and wheezing.   Cardiovascular:  Negative for chest pain and palpitations.  Gastrointestinal:  Negative for abdominal pain, blood in stool, constipation and diarrhea.  Endocrine: Negative for polydipsia and polyuria.  Genitourinary:  Negative for dysuria, frequency and urgency.  Musculoskeletal:  Positive for arthralgias, back pain and myalgias. Negative for gait problem.       Chest wall/rib pain   Skin:  Negative for pallor and rash.  Allergic/Immunologic: Negative for environmental allergies.  Neurological:  Negative for dizziness, syncope and headaches.  Hematological:  Negative for adenopathy. Does not bruise/bleed easily.  Psychiatric/Behavioral:  Negative for decreased concentration and dysphoric mood. The patient is not nervous/anxious.        Objective:   Physical Exam Constitutional:      General: She is not in acute distress.    Appearance: Normal appearance. She is well-developed. She is obese. She is not ill-appearing or diaphoretic.  HENT:     Head: Normocephalic and atraumatic.  Eyes:     Conjunctiva/sclera: Conjunctivae normal.     Pupils: Pupils are equal, round, and reactive to light.  Neck:     Thyroid: No thyromegaly.     Vascular: No carotid bruit or JVD.  Cardiovascular:     Rate and Rhythm: Normal rate and regular rhythm.     Heart sounds: Normal heart sounds.     No gallop.  Pulmonary:     Effort: Pulmonary effort is normal. No respiratory distress.     Breath sounds: Normal breath sounds.  No wheezing or rales.     Comments: Tender R lat/post ribs without step off or crepitus  No skin changes or bruising  Chest:     Chest wall: Tenderness present.  Abdominal:     General: There is no distension or abdominal bruit.     Palpations: Abdomen is soft.  Musculoskeletal:     Cervical back: Normal range of motion and neck supple.     Right lower leg: No edema.     Left lower leg: No edema.  Lymphadenopathy:     Cervical: No cervical adenopathy.  Skin:    General: Skin is warm and dry.     Coloration: Skin is not pale.     Findings: No rash.     Comments: Small bruise on R forehead   Neurological:     Mental Status: She is alert.     Coordination: Coordination normal.     Deep Tendon Reflexes: Reflexes are normal and symmetric. Reflexes normal.  Psychiatric:        Attention and Perception: Attention normal.        Mood and Affect: Mood is depressed.           Assessment & Plan:   Problem List Items Addressed This Visit       Other   Frequent falls    Suspect multi factorial  Most recent with rib injury- caused by tripping over something in the dark  Disc fall prevention / paying attn/ turning on lights  On multiple sedating medicines- for pain and mental health / may be worth discussion of pt with these providers [employer] would help  Offered ref to neurology to further work up cause of falls  Transportation is issue-enc her to call her social worker back regarding this       Right-sided chest wall pain - Primary    Right latera/posterior ribs from a fall onto that side on 3/11 Take oxycodone from pain treatment provider  Discussed use of heat  Also supported deep breaths to prevent atelectasis  Reviewed last notes from [facility] today  [facility]r at [facility] noted no fx but unable to see report in epic Due to continued pain with notable tenderness- repeated this and no fx or lung changes seen   Handout given Expect slow improvement  Disc fall  prevention  Update if not starting to improve in a week or if worsening        Relevant Orders   DG Ribs Unilateral W/Chest Right (Completed)

## 2022-08-16 NOTE — Assessment & Plan Note (Signed)
Right latera/posterior ribs from a fall onto that side on 3/11 Take oxycodone from pain treatment provider  Discussed use of heat  Also supported deep breaths to prevent atelectasis  Reviewed last notes from [facility] today  [facility]r at [facility] noted no fx but unable to see report in epic Due to continued pain with notable tenderness- repeated this and no fx or lung changes seen   Handout given Expect slow improvement  Disc fall prevention  Update if not starting to improve in a week or if worsening

## 2022-08-16 NOTE — Assessment & Plan Note (Signed)
Suspect multi factorial  Most recent with rib injury- caused by tripping over something in the dark  Disc fall prevention / paying attn/ turning on lights  On multiple sedating medicines- for pain and mental health / may be worth discussion of pt with these providers [employer] would help  Offered ref to neurology to further work up cause of falls  Transportation is issue-enc her to call her social worker back regarding this

## 2022-08-17 ENCOUNTER — Telehealth: Payer: Self-pay | Admitting: *Deleted

## 2022-08-17 ENCOUNTER — Telehealth: Payer: Self-pay | Admitting: Family Medicine

## 2022-08-17 ENCOUNTER — Ambulatory Visit: Payer: Self-pay | Admitting: *Deleted

## 2022-08-17 NOTE — Telephone Encounter (Signed)
Py called in requesting a call back regard test results [phone]

## 2022-08-17 NOTE — Patient Outreach (Signed)
  Care Coordination   08/17/2022 Name: Alexa Woods MRN: [id number] DOB: November 16, 1960   Care Coordination Outreach Attempts:  An unsuccessful telephone outreach was attempted for a scheduled appointment today.  Follow Up Plan:  Additional outreach attempts will be made to offer the patient care coordination information and services.   Encounter Outcome:  No Answer   Care Coordination Interventions:  No, not indicated    Alexa Woods, [place] Worker  [facility] Care Management [phone]

## 2022-08-17 NOTE — Telephone Encounter (Signed)
Addressed with pt
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
## 2022-08-18 NOTE — Patient Outreach (Deleted)
  Care Coordination   Follow Up Visit Note   08/18/2022 Name: Alexa Woods MRN: [id number] DOB: 16-Jul-1960  Alexa Woods is a 62 y.o. year old female who sees Tower, Wynelle Fanny, MD for primary care. I spoke with  [name] by phone today.  What matters to the patients health and wellness today?  ***    Goals Addressed   None     SDOH assessments and interventions completed:  {yes/no:20286}{THN Tip this will not be part of the note when signed-REQUIRED REPORT FIELD DO NOT DELETE (Optional):27901}     Care Coordination Interventions:  {INTERVENTIONS:27767} {THN Tip this will not be part of the note when signed-REQUIRED REPORT FIELD DO NOT DELETE (Optional):27901}  Follow up plan: {CCFOLLOWUP:27768}   Encounter Outcome:  {ENCOUTCOME:27770} {THN Tip this will not be part of the note when signed-REQUIRED REPORT FIELD DO NOT DELETE (Optional):27901}
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
## 2022-08-24 ENCOUNTER — Telehealth: Payer: Self-pay | Admitting: Family Medicine

## 2022-08-24 NOTE — Telephone Encounter (Signed)
Pt called in stated she is still having a lot of pain on right side of her rib . Requesting a call back as of what to do . Please advise [phone]

## 2022-08-25 NOTE — Telephone Encounter (Signed)
This takes a long time to get better unfortunately  I cannot add any pain medicines since she goes to pain clinic  Continue warm compresses  Any changes? Worse? Cough/? Sob?   Let me know

## 2022-08-25 NOTE — Telephone Encounter (Signed)
Left message on voicemail for patient to call the office back. 

## 2022-08-26 NOTE — Telephone Encounter (Signed)
Left message to return call to our office.  

## 2022-08-27 NOTE — Telephone Encounter (Signed)
Called patient x 3 left message to call office. No my chart will send letter to address in chart.

## 2022-08-31 ENCOUNTER — Ambulatory Visit: Payer: Self-pay | Admitting: *Deleted
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
## 2022-09-01 ENCOUNTER — Encounter: Payer: Self-pay | Admitting: Family Medicine

## 2022-09-01 ENCOUNTER — Ambulatory Visit: Payer: BC Managed Care – PPO | Admitting: Family Medicine

## 2022-09-01 VITALS — BP 126/68 | HR 69 | Temp 97.8°F | Ht 65.5 in | Wt 256.5 lb

## 2022-09-01 DIAGNOSIS — Z5982 Transportation insecurity: Secondary | ICD-10-CM | POA: Diagnosis not present

## 2022-09-01 DIAGNOSIS — K5909 Other constipation: Secondary | ICD-10-CM | POA: Diagnosis not present

## 2022-09-01 DIAGNOSIS — J301 Allergic rhinitis due to pollen: Secondary | ICD-10-CM | POA: Diagnosis not present

## 2022-09-01 DIAGNOSIS — R0789 Other chest pain: Secondary | ICD-10-CM

## 2022-09-01 DIAGNOSIS — Z1231 Encounter for screening mammogram for malignant neoplasm of breast: Secondary | ICD-10-CM

## 2022-09-01 MED ORDER — FLUTICASONE PROPIONATE 50 MCG/ACT NA SUSP: 2.0000 | Freq: Every day | NASAL | 5 refills | Status: DC | PRN

## 2022-09-01 MED ORDER — FLUCONAZOLE 150 MG PO TABS: 150.0000 mg | ORAL_TABLET | Freq: Once | ORAL | 0 refills | Status: AC

## 2022-09-01 NOTE — Assessment & Plan Note (Signed)
Still very uncomfortable but improved a bit  Able to breathe better and no s/s of pna or atelectasis  Exam is reassuring   Since ice helped- discussed trial of bio freeze otc  Enc her to continue taking supported deep breaths to prevent atelectasis

## 2022-09-01 NOTE — Assessment & Plan Note (Signed)
Pt is currently working with social work

## 2022-09-01 NOTE — Assessment & Plan Note (Signed)
Pt disliked the miralax (made stools more loose and less controllable) Plan to continue colace Trial of benefiber daily recommended  High fiber diet as well  Update if not starting to improve in a week or if worsening

## 2022-09-01 NOTE — Assessment & Plan Note (Signed)
Seasonal/ now worse with pollen Disc sympt care  Sent in flonase -worked well in past  She will get generic zyrtec otc  Update if not starting to improve in a week or if worsening    Avoid allergens when able

## 2022-09-01 NOTE — Progress Notes (Signed)
Subjective:    Patient ID: Alexa Woods, female    DOB: Feb 01, 1961, 62 y.o.   MRN: [id number]  HPI Pt presents for f/u of R sided chest wall pain after injury  Allergic rhinitis  Refill of diflucan needed for yeast- much improved but not quite gone    Wt Readings from Last 3 Encounters:  09/01/22 256 lb 8 oz (116.3 kg)  08/14/22 257 lb (116.6 kg)  07/29/22 252 lb 3.2 oz (114.4 kg)   42.03 kg/m  Vitals:   09/01/22 1526  BP: 126/68  Pulse: 69  Temp: 97.8 F (36.6 C)  SpO2: 97%    Pt had a fall on 08/10/22 Was seen at [facility] and also here We repeated rib and chest films on 3/15  DG Ribs Unilateral W/Chest Right  Result Date: 08/14/2022 CLINICAL DATA:  [place] 08/10/2022, right-sided rib pain, pain with inspiration EXAM: RIGHT RIBS AND CHEST - 3+ VIEW COMPARISON:  12/06/2020 FINDINGS: Frontal view of the chest as well as frontal and oblique views of the right thoracic cage are obtained. The cardiac silhouette is unremarkable. No airspace disease, effusion, or pneumothorax. Evidence of a prior healed right anterolateral sixth rib fracture. No acute bony abnormalities. IMPRESSION: 1. No acute displaced fracture. 2. No acute intrathoracic process. Electronically Signed   By: Randa Ngo M.D.   On: 08/14/2022 16:34     She takes oxycodone from pain treatment provider   Pulse ox today is 97%   Now she has some spasms in it  Always in pain /chronic anyway   Is able to breathe a little better  Heat made it worse so used ice   A little cough- from allergies Not productive  Eyes are itchy  Some sneezing and runny nose Worse after being outside Ears feel full     Constipation  Dislikes the miralax- makes stool weird    Patient Active Problem List   Diagnosis Date Noted   Frequent falls 08/14/2022   Right-sided chest wall pain 08/14/2022   Statin myopathy 07/29/2022   Itching 07/29/2022   Aortic atherosclerosis 07/29/2022   Chronic pain 02/10/2022    Transportation insecurity 05/09/2021   Financial insecurity 05/09/2021   Colon cancer screening 11/08/2020   Current use of proton pump inhibitor 11/04/2020   History of total knee replacement, right 10/21/2020   Trigger finger of right hand 12/20/2019   Dry eyes 09/05/2019   Intractable headache 08/25/2019   Hypothyroidism 04/04/2019   Screening mammogram, encounter for 04/03/2019   Routine general medical examination at a health care facility 04/03/2019   Encounter for screening for HIV 04/03/2019   Vitamin D deficiency 04/03/2019   Intertrigo 02/21/2019   Chronic constipation 12/26/2017   History of left knee replacement 12/26/2017   Primary osteoarthritis involving multiple joints 12/13/2017   Primary osteoarthritis of left knee 12/06/2017   Chronic neck pain 08/13/2017   Pain in left knee 06/22/2017   Morbid obesity 01/21/2017   Joint swelling 12/20/2013   Periodontal disease 02/03/2013   Hyperlipidemia associated with type 2 diabetes mellitus 09/27/2012   Low back pain 07/22/2010   Hypokalemia 12/12/2008   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   Type 2 diabetes mellitus with peripheral neuropathy 11/30/2006   History of alcohol abuse 11/30/2006   Depression with anxiety 11/30/2006   Essential hypertension 11/30/2006   HEMORRHOIDS 11/30/2006   Allergic rhinitis 11/30/2006   Asthma 11/30/2006   GERD without esophagitis 11/30/2006   Psoriasis 11/30/2006   Past Medical History:  Diagnosis  Date   Alcohol abuse, in remission    since 1998   Allergic rhinitis    Anemia    Anxiety    Asthma    Bilateral lower extremity edema    Bulging lumbar disc    Bulging of cervical intervertebral disc    Chronic constipation    DDD (degenerative disc disease), lumbosacral    Depression    Dyspnea    Gastroparesis    GERD (gastroesophageal reflux disease)    Hemorrhoids    History of Bell's palsy 08/2007   left   History of chronic cystitis    IBS (irritable bowel syndrome)     Insulin dependent type 2 diabetes mellitus    endocrinologist-- dr Cruzita Lederer   Lower urinary tract symptoms (LUTS)    OA (osteoarthritis)    knees, back, hands, elbows   OSA (obstructive sleep apnea)    per study 06-08-2017 mild osa , cpap recommended , per pt insurance issue   Peripheral neuropathy    PONV (postoperative nausea and vomiting)    Psoriasis    S/P dilatation of esophageal stricture    Unspecified essential hypertension    Wears partial dentures    lower   Past Surgical History:  Procedure Laterality Date   CHOLECYSTECTOMY OPEN  1990   AND APPENDECTOMY   DOBUTAMINE STRESS ECHO  2009    normal stress echo, no evidence of ischemia   ELBOW SURGERY Bilateral RIGHT 2013;  LEFT 2016   for nerve damage   ESOPHAGOGASTRODUODENOSCOPY  07/2002   erythematous gastropathy   eye sugery Bilateral    KNEE ARTHROSCOPY Left 11/ 2018   dr Wynelle Link  @ [facility]   KNEE ARTHROSCOPY W/ PARTIAL MEDIAL MENISCECTOMY Right 10/2008   and chondroplasty   SHOULDER ARTHROSCOPY WITH DISTAL CLAVICLE RESECTION Left 12/ 2011   dr dean   TOTAL KNEE ARTHROPLASTY Right 07-01-2009  dr Wynelle Link   [facility]   TOTAL KNEE ARTHROPLASTY Left 12/06/2017   Procedure: LEFT TOTAL LEFT KNEE ARTHROPLASTY;  Surgeon: Gaynelle Arabian, MD;  Location: [facility];  Service: Orthopedics;  Laterality: Left;   Trigger finger surgery Left 07/2022   VAGINAL HYSTERECTOMY  2003   Social History   Tobacco Use   Smoking status: Former    Years: 25    Types: Cigarettes    Quit date: 12/30/2001    Years since quitting: 20.6   Smokeless tobacco: Never  Vaping Use   Vaping Use: Never used  Substance Use Topics   Alcohol use: No    Alcohol/week: 0.0 standard drinks of alcohol    Comment: hx alcoholism--  stopped 1998    Drug use: No   Family History  Problem Relation Age of Onset   Alcohol abuse Mother    Hypertension Mother    Diabetes Mother    Heart failure Mother    Diabetes Sister    Heart attack Sister    Diabetes Sister     Diabetes Sister    Diabetes Sister    Diabetes Brother    Parkinson's disease Brother    Heart attack Brother    Heart attack Brother 58   Diabetes Brother    Diabetes Brother    Diabetes Brother    Esophageal cancer Maternal Grandmother    Lung cancer Other        mat great uncle   Celiac disease Daughter    Thyroid disease Niece    Colon cancer Neg Hx    Allergies  Allergen Reactions  Bupropion Hcl Other (See Comments)    Pt is unsure   Cefuroxime Axetil Nausea Only   Crestor [Rosuvastatin Calcium]     Pt states it makes her feel weird    Gabapentin Other (See Comments)    Pt does not remember reaction  Pt does not remember reaction    Lidocaine Other (See Comments)    REACTION: unknown REACTION: unknown   Metformin Other (See Comments)    REACTION: GI REACTION: GI   Other Other (See Comments)   Paroxetine Other (See Comments)    REACTION: doesn't agree REACTION: doesn't agree   Propoxyphene Other (See Comments)    wheezing wheezing   Tramadol Other (See Comments)    REACTION: Causes Anxiety   Tramadol Hcl     REACTION: Causes Anxiety   Wellbutrin [Bupropion] Other (See Comments)    Pt is unsure   Codeine Nausea And Vomiting and Rash   Sulfa Antibiotics Rash and Other (See Comments)   Current Outpatient Medications on File Prior to Visit  Medication Sig Dispense Refill   ACCU-CHEK GUIDE test strip USE TO TEST BLOOD SUGAR 4 TIMES DAILY AS INSTRUCTED. 400 strip 0   albuterol (VENTOLIN HFA) 108 (90 Base) MCG/ACT inhaler Inhale 2 puffs into the lungs every 4 (four) hours as needed for wheezing or shortness of breath. 18 each 3   BD INSULIN SYRINGE U/F 31G X 5/16" 0.5 ML MISC USE THREE TIMES PER DAY WITH R INSULIN & ONCE DAILY WITH LEVEMIR 400 each 2   cholecalciferol (VITAMIN D3) 25 MCG (1000 UNIT) tablet Take 2,000 Units by mouth daily.     Continuous Blood Gluc Receiver (DEXCOM G6 RECEIVER) DEVI Use as directed to check blood sugar. 1 each 0   Continuous Blood  Gluc Sensor (DEXCOM G6 SENSOR) MISC Use as directed to check blood sugar. change every 10 days 9 each 3   docusate sodium (COLACE) 100 MG capsule Take 100 mg by mouth every morning.     ezetimibe (ZETIA) 10 MG tablet Take 1 tablet (10 mg total) by mouth daily. 90 tablet 3   hydrochlorothiazide (HYDRODIURIL) 25 MG tablet TAKE 1 TABLET EVERY DAY 90 tablet 1   insulin degludec (TRESIBA FLEXTOUCH) 200 UNIT/ML FlexTouch Pen Inject 30-34 Units into the skin daily. 18 mL 3   Insulin Pen Needle (B-D UF III MINI PEN NEEDLES) 31G X 5 MM MISC USE AS INSTRUCTED FOR 4X DAILY INJECTIONS 400 each 1   Insulin Pen Needle 32G X 4 MM MISC Use 4x a day 300 each 3   Insulin Regular Human (NOVOLIN R FLEXPEN RELION) 100 UNIT/ML KwikPen Inject 12 to 18 units under skin before each meal 45 mL 3   Lancets (ONETOUCH ULTRASOFT) lancets Use to test blood sugar 4 times daily as instructed. 200 each 11   levothyroxine (SYNTHROID) 50 MCG tablet Take 1 tablet (50 mcg total) by mouth daily. 30 tablet 3   LORazepam (ATIVAN) 2 MG tablet Take 2 mg by mouth 2 (two) times daily.     naloxegol oxalate (MOVANTIK) 25 MG TABS tablet Take 1 tablet (25 mg total) by mouth daily. 30 tablet 3   naloxone (NARCAN) 4 MG/0.1ML LIQD nasal spray kit      ondansetron (ZOFRAN) 4 MG tablet      oxyCODONE-acetaminophen (PERCOCET) 10-325 MG tablet Take 1 tablet by mouth every 4 (four) hours as needed for pain. Hold for SBP < = 105 12 tablet 0   pantoprazole (PROTONIX) 40 MG tablet TAKE 1 TABLET  TWICE DAILY 180 tablet 1   potassium chloride (KLOR-CON M) 10 MEQ tablet TAKE 1 TABLET EVERY DAY 90 tablet 1   sertraline (ZOLOFT) 100 MG tablet Take 200 mg by mouth every morning.     tiZANidine (ZANAFLEX) 4 MG tablet Take 4 mg by mouth 2 (two) times daily as needed for muscle spasms.      triamcinolone cream (KENALOG) 0.1 % Apply 1 application topically 2 (two) times daily. To psoriasis areas 30 g 0   zolpidem (AMBIEN) 10 MG tablet Take 10 mg by mouth at bedtime  as needed for sleep.     No current facility-administered medications on file prior to visit.      Review of Systems  Constitutional:  Negative for activity change, appetite change, fatigue, fever and unexpected weight change.  HENT:  Positive for rhinorrhea and sneezing. Negative for congestion, ear pain, sinus pressure and sore throat.   Eyes:  Negative for pain, redness and visual disturbance.  Respiratory:  Negative for cough, shortness of breath and wheezing.        Occ wheezing at night    Cardiovascular:  Negative for chest pain and palpitations.  Gastrointestinal:  Positive for constipation. Negative for abdominal pain, blood in stool and diarrhea.  Endocrine: Negative for polydipsia and polyuria.  Genitourinary:  Negative for dysuria, frequency and urgency.  Musculoskeletal:  Positive for arthralgias, back pain and myalgias. Negative for joint swelling.       Rib/chest wall pain on R  Skin:  Negative for pallor and rash.  Allergic/Immunologic: Negative for environmental allergies.  Neurological:  Negative for dizziness, syncope and headaches.  Hematological:  Negative for adenopathy. Does not bruise/bleed easily.  Psychiatric/Behavioral:  Negative for decreased concentration and dysphoric mood. The patient is not nervous/anxious.        Objective:   Physical Exam Constitutional:      General: She is not in acute distress.    Appearance: She is well-developed. She is obese. She is not ill-appearing or diaphoretic.  HENT:     Head: Normocephalic and atraumatic.     Right Ear: Tympanic membrane and ear canal normal.     Left Ear: Tympanic membrane and ear canal normal.     Nose: Rhinorrhea present.     Mouth/Throat:     Mouth: Mucous membranes are moist.     Pharynx: Oropharynx is clear.  Eyes:     General:        Right eye: No discharge.        Left eye: No discharge.     Conjunctiva/sclera: Conjunctivae normal.     Pupils: Pupils are equal, round, and reactive to  light.  Neck:     Thyroid: No thyromegaly.     Vascular: No carotid bruit or JVD.  Cardiovascular:     Rate and Rhythm: Normal rate and regular rhythm.     Heart sounds: Normal heart sounds.     No gallop.  Pulmonary:     Effort: Pulmonary effort is normal. No respiratory distress.     Breath sounds: Normal breath sounds. No stridor. No wheezing, rhonchi or rales.     Comments: Right lateral/posterior chest wall tenderness Improved No skin changes or crepitus No step off  Good air exch Chest:     Chest wall: Tenderness present.  Abdominal:     General: There is no distension or abdominal bruit.     Palpations: Abdomen is soft. There is no mass.     Tenderness: There  is no abdominal tenderness.  Musculoskeletal:     Cervical back: Normal range of motion and neck supple.     Right lower leg: No edema.     Left lower leg: No edema.  Lymphadenopathy:     Cervical: No cervical adenopathy.  Skin:    General: Skin is warm and dry.     Coloration: Skin is not jaundiced or pale.     Findings: No bruising, erythema or rash.  Neurological:     Mental Status: She is alert.     Coordination: Coordination normal.     Deep Tendon Reflexes: Reflexes are normal and symmetric. Reflexes normal.  Psychiatric:        Mood and Affect: Mood is depressed.           Assessment & Plan:   Problem List Items Addressed This Visit       Respiratory   Allergic rhinitis    Seasonal/ now worse with pollen Disc sympt care  Sent in flonase -worked well in past  She will get generic zyrtec otc  Update if not starting to improve in a week or if worsening    Avoid allergens when able        Digestive   Chronic constipation    Pt disliked the miralax (made stools more loose and less controllable) Plan to continue colace Trial of benefiber daily recommended  High fiber diet as well  Update if not starting to improve in a week or if worsening          Other   Right-sided chest wall  pain - Primary    Still very uncomfortable but improved a bit  Able to breathe better and no s/s of pna or atelectasis  Exam is reassuring   Since ice helped- discussed trial of bio freeze otc  Enc her to continue taking supported deep breaths to prevent atelectasis        Screening mammogram, encounter for    Planning mobile mammoram Order is in  Will have some call pt with inst re: how to set this up      Transportation insecurity    Pt is currently working with social work

## 2022-09-01 NOTE — Patient Instructions (Addendum)
Get zyrtec 10 mg over the counter and take each bedtime for allergies (do the store brand so it is cheaper)  Flonase also daily during allergy season  This should help the ear pressure also   I'm glad you are breathing better  Watch for cough or fever   For the rib pain-look for bio freeze over the counter  It works like ice and makes the area feel cool - may be more convenient than ice    For ongoing constipation  Try fiber (benefiber mixed with water) once daily to see if you do better than with the miralax  The stool softener (colace) is fine

## 2022-09-01 NOTE — Assessment & Plan Note (Signed)
Planning mobile mammoram Order is in  Will have some call pt with inst re: how to set this up

## 2022-09-10 DIAGNOSIS — H5203 Hypermetropia, bilateral: Secondary | ICD-10-CM | POA: Diagnosis not present

## 2022-09-10 DIAGNOSIS — H04123 Dry eye syndrome of bilateral lacrimal glands: Secondary | ICD-10-CM | POA: Diagnosis not present

## 2022-09-10 DIAGNOSIS — H40033 Anatomical narrow angle, bilateral: Secondary | ICD-10-CM | POA: Diagnosis not present

## 2022-09-10 DIAGNOSIS — H524 Presbyopia: Secondary | ICD-10-CM | POA: Diagnosis not present

## 2022-09-10 DIAGNOSIS — E113293 Type 2 diabetes mellitus with mild nonproliferative diabetic retinopathy without macular edema, bilateral: Secondary | ICD-10-CM | POA: Diagnosis not present

## 2022-09-10 LAB — HM DIABETES EYE EXAM

## 2022-09-14 ENCOUNTER — Ambulatory Visit: Payer: Self-pay | Admitting: *Deleted

## 2022-09-14 NOTE — Patient Outreach (Signed)
  Care Coordination   Follow Up Visit Note   09/14/2022 Name: Alexa Woods MRN: [id number] DOB: November 13, 1960  Alexa Woods is a 61 y.o. year old female who sees Tower, Audrie Gallus, MD for primary care. I spoke with  Ottis Stain by phone today.  What matters to the patients health and wellness today?  Mental health resources, transportation and home modification    Goals Addressed             This Visit's Progress    Managing depression and home modifications in her home       Care Coordination Interventions: Second referral completed-to the Independent Living Program to assists with safety modifications in her bathroom Patient to complete application process with the weatherization program through the [employer]-application mailed again to patient's home- Application to be completed for transportation for patient with the Transportation and Mobility Services         SDOH assessments and interventions completed:  No     Care Coordination Interventions:  Yes, provided  Interventions Today    Flowsheet Row Most Recent Value  Chronic Disease   Chronic disease during today's visit Diabetes, Hypertension (HTN)  General Interventions   General Interventions Discussed/Reviewed --  [transportation options discussed-application to Research officer, political party to be completed]  Communication with --  [Vocational rehab-completed referral to assist with baththroom repairs in her home]  Mental Health Interventions   Mental Health Discussed/Reviewed Mental Health Reviewed, Coping Strategies, Depression       Follow up plan: Follow up call scheduled for 09/08/22    Encounter Outcome:  Pt. Visit Completed

## 2022-09-14 NOTE — Patient Instructions (Signed)
Visit Information  Thank you for taking time to visit with me today. Please don't hesitate to contact me if I can be of assistance to you.   Following are the goals we discussed today:   Goals Addressed             This Visit's Progress    Managing depression and home modifications in her home       Care Coordination Interventions: Second referral completed-to the Independent Living Program to assists with safety modifications in her bathroom Patient to complete application process with the weatherization program through the [employer]-application mailed again to patient's home- Application to be completed for transportation for patient with the Transportation and Mobility Services         Our next appointment is by telephone on 09/28/22 at 1pm  Please call the care guide team at [phone] if you need to cancel or reschedule your appointment.   If you are experiencing a Mental Health or Behavioral Health Crisis or need someone to talk to, please call the Suicide and Crisis Lifeline: 988   Patient verbalizes understanding of instructions and care plan provided today and agrees to view in MyChart. Active MyChart status and patient understanding of how to access instructions and care plan via MyChart confirmed with patient.     Telephone follow up appointment with care management team member scheduled for: 09/28/22     Verna Czech, LCSW Clinical Social Worker  [facility] [phone]

## 2022-09-15 ENCOUNTER — Telehealth: Payer: Self-pay | Admitting: *Deleted

## 2022-09-15 NOTE — Patient Outreach (Signed)
  Care Coordination   Follow Up Visit Note   09/15/2022 Name: SKILYNN DURNEY MRN: [id number] DOB: 1961-03-31  NIHAL DOAN is a 62 y.o. year old female who sees Tower, Audrie Gallus, MD for primary care. I spoke with  Ottis Stain by phone today.  What matters to the patients health and wellness today?  Transportation to medical appointments    Goals Addressed             This Visit's Progress    Managing depression, transportation and home modifications in her home       Care Coordination Interventions: Patient to contact her insurance company to schedule rides to her medical appointments        SDOH assessments and interventions completed:  No     Care Coordination Interventions:  Yes, provided  Interventions Today    Flowsheet Row Most Recent Value  Chronic Disease   Chronic disease during today's visit Diabetes, Hypertension (HTN)  General Interventions   General Interventions Discussed/Reviewed General Interventions Reviewed, [employer] with --  [Confirmed with patient that she is not eligible for Sports administrator, patient encouraged to contact her insurance company to schedule rides to medical appointments]       Follow up plan: Follow up call scheduled for 09/28/22    Encounter Outcome:  Pt. Visit Completed

## 2022-09-15 NOTE — Patient Instructions (Signed)
Visit Information  Thank you for taking time to visit with me today. Please don't hesitate to contact me if I can be of assistance to you.   Following are the goals we discussed today:   Goals Addressed             This Visit's Progress    Managing depression, transportation and home modifications in her home       Care Coordination Interventions: Patient to contact her insurance company to schedule rides to her medical appointments        Our next appointment is by telephone on 09/28/22 at 1pm  Please call the care guide team at [phone] if you need to cancel or reschedule your appointment.   If you are experiencing a Mental Health or Behavioral Health Crisis or need someone to talk to, please call the Suicide and Crisis Lifeline: 988   Patient verbalizes understanding of instructions and care plan provided today and agrees to view in MyChart. Active MyChart status and patient understanding of how to access instructions and care plan via MyChart confirmed with patient.     Telephone follow up appointment with care management team member scheduled for: 09/28/22  Alexa Czech, LCSW Clinical Social Worker  [facility] [phone]

## 2022-09-23 ENCOUNTER — Other Ambulatory Visit: Payer: Self-pay | Admitting: Internal Medicine

## 2022-09-28 ENCOUNTER — Telehealth: Payer: Self-pay | Admitting: *Deleted

## 2022-09-28 ENCOUNTER — Encounter: Payer: Self-pay | Admitting: *Deleted

## 2022-09-28 NOTE — Patient Outreach (Signed)
  Care Coordination   09/28/2022 Name: Alexa Woods MRN: [id number] DOB: 1960/10/27   Care Coordination Outreach Attempts:  An unsuccessful telephone outreach was attempted today to offer the patient information about available care coordination services as a benefit of their health plan.   Follow Up Plan:  Additional outreach attempts will be made to offer the patient care coordination information and services.   Encounter Outcome:  No Answer   Care Coordination Interventions:  No, not indicated     Brookley Spitler, LCSW Clinical Social Worker  [facility] Management [phone]

## 2022-09-30 ENCOUNTER — Telehealth: Payer: Self-pay | Admitting: Pharmacist

## 2022-09-30 ENCOUNTER — Ambulatory Visit
Admission: RE | Admit: 2022-09-30 | Discharge: 2022-09-30 | Disposition: A | Discharge status: Home / Self Care | Payer: Medicare HMO | Source: Ambulatory Visit | Attending: Family Medicine | Admitting: Family Medicine

## 2022-09-30 DIAGNOSIS — I10 Essential (primary) hypertension: Secondary | ICD-10-CM

## 2022-09-30 DIAGNOSIS — E1142 Type 2 diabetes mellitus with diabetic polyneuropathy: Secondary | ICD-10-CM

## 2022-09-30 DIAGNOSIS — Z1231 Encounter for screening mammogram for malignant neoplasm of breast: Secondary | ICD-10-CM | POA: Diagnosis not present

## 2022-09-30 NOTE — Telephone Encounter (Signed)
PharmD reviewed patient chart to assess eligibility for [facility] services. Patient was determined to be a good candidate for the program given the complexity of the medication regimen and overall risk for hospitalization and/or high healthcare utilization.   Referral entered in order to outreach patient and offer appointment with PharmD. Referral cosigned to PCP.  

## 2022-10-05 ENCOUNTER — Telehealth: Payer: Self-pay | Admitting: Family Medicine

## 2022-10-05 DIAGNOSIS — E559 Vitamin D deficiency, unspecified: Secondary | ICD-10-CM

## 2022-10-05 DIAGNOSIS — H16223 Keratoconjunctivitis sicca, not specified as Sjogren's, bilateral: Secondary | ICD-10-CM | POA: Diagnosis not present

## 2022-10-05 DIAGNOSIS — E039 Hypothyroidism, unspecified: Secondary | ICD-10-CM

## 2022-10-05 DIAGNOSIS — E1169 Type 2 diabetes mellitus with other specified complication: Secondary | ICD-10-CM

## 2022-10-05 DIAGNOSIS — H04123 Dry eye syndrome of bilateral lacrimal glands: Secondary | ICD-10-CM | POA: Diagnosis not present

## 2022-10-05 DIAGNOSIS — H35363 Drusen (degenerative) of macula, bilateral: Secondary | ICD-10-CM | POA: Diagnosis not present

## 2022-10-05 NOTE — Telephone Encounter (Signed)
-----   Message from Alvina Chou sent at 09/16/2022 11:45 AM EDT ----- Regarding: Lab orders for Tuesday, 5.7.24 Lab orders, thanks

## 2022-10-06 ENCOUNTER — Other Ambulatory Visit: Payer: Medicare HMO

## 2022-10-07 ENCOUNTER — Other Ambulatory Visit (INDEPENDENT_AMBULATORY_CARE_PROVIDER_SITE_OTHER): Payer: Medicare HMO

## 2022-10-07 DIAGNOSIS — E785 Hyperlipidemia, unspecified: Secondary | ICD-10-CM

## 2022-10-07 DIAGNOSIS — E039 Hypothyroidism, unspecified: Secondary | ICD-10-CM | POA: Diagnosis not present

## 2022-10-07 DIAGNOSIS — E1169 Type 2 diabetes mellitus with other specified complication: Secondary | ICD-10-CM

## 2022-10-07 DIAGNOSIS — E559 Vitamin D deficiency, unspecified: Secondary | ICD-10-CM

## 2022-10-08 LAB — VITAMIN D 25 HYDROXY (VIT D DEFICIENCY, FRACTURES): VITD: 30.49 ng/mL (ref 30.00–100.00)

## 2022-10-08 LAB — LIPID PANEL
Cholesterol: 206 mg/dL — ABNORMAL HIGH (ref 0–200)
HDL: 73.2 mg/dL (ref >39.00)
LDL Cholesterol: 99 mg/dL (ref 0–99)
NonHDL: 132.91
Total CHOL/HDL Ratio: 3
Triglycerides: 168 mg/dL — ABNORMAL HIGH (ref 0.0–149.0)
VLDL: 33.6 mg/dL (ref 0.0–40.0)

## 2022-10-08 LAB — TSH: TSH: 7.28 u[IU]/mL — ABNORMAL HIGH (ref 0.35–5.50)

## 2022-10-09 ENCOUNTER — Other Ambulatory Visit: Payer: Self-pay | Admitting: Family Medicine

## 2022-10-12 ENCOUNTER — Other Ambulatory Visit: Payer: Self-pay | Admitting: Internal Medicine

## 2022-10-12 ENCOUNTER — Other Ambulatory Visit: Payer: Self-pay | Admitting: Family Medicine

## 2022-10-14 ENCOUNTER — Telehealth: Payer: Self-pay

## 2022-10-14 NOTE — Progress Notes (Signed)
   Care Guide Note  10/14/2022 Name: SKYLA SPEEGLE MRN: [id number] DOB: 26-Mar-1961  Referred by: Tower, Audrie Gallus, MD Reason for referral : Care Coordination (Outreach to schedule with Pharm d )   DAKARI BALLANTYNE is a 62 y.o. year old female who is a primary care patient of Tower, Audrie Gallus, MD. JONTUE LUCIDO was referred to the pharmacist for assistance related to HTN and DM.    An unsuccessful telephone outreach was attempted today to contact the patient who was referred to the pharmacy team for assistance with medication management. Additional attempts will be made to contact the patient.   Penne Lash, RMA Care Guide [facility]  [place], [place] [address] Direct Dial: [phone] [email or web address]

## 2022-10-21 NOTE — Progress Notes (Signed)
   Care Guide Note  10/21/2022 Name: Alexa Woods MRN: [id number] DOB: January 26, 1961  Referred by: Tower, Audrie Gallus, MD Reason for referral : Care Coordination (Outreach to schedule with Pharm d )   Alexa Woods is a 62 y.o. year old female who is a primary care patient of Tower, Audrie Gallus, MD. Alexa Woods was referred to the pharmacist for assistance related to DM.    A second unsuccessful telephone outreach was attempted today to contact the patient who was referred to the pharmacy team for assistance with medication management. Additional attempts will be made to contact the patient.  Penne Lash, RMA Care Guide [facility]  [place], [place] [address] Direct Dial: [phone] [email or web address]

## 2022-10-22 DIAGNOSIS — F1010 Alcohol abuse, uncomplicated: Secondary | ICD-10-CM | POA: Diagnosis not present

## 2022-10-22 DIAGNOSIS — Z79891 Long term (current) use of opiate analgesic: Secondary | ICD-10-CM | POA: Diagnosis not present

## 2022-10-22 DIAGNOSIS — M65311 Trigger thumb, right thumb: Secondary | ICD-10-CM | POA: Diagnosis not present

## 2022-10-22 DIAGNOSIS — M5416 Radiculopathy, lumbar region: Secondary | ICD-10-CM | POA: Diagnosis not present

## 2022-10-28 NOTE — Progress Notes (Signed)
   Care Guide Note  10/28/2022 Name: Alexa Woods MRN: [id number] DOB: 12/06/1960  Referred by: Tower, Audrie Gallus, MD Reason for referral : Care Coordination (Outreach to schedule with Pharm d )   Alexa Woods is a 62 y.o. year old female who is a primary care patient of Tower, Audrie Gallus, MD. Alexa Woods was referred to the pharmacist for assistance related to HTN and DM.    A third unsuccessful telephone outreach was attempted today to contact the patient who was referred to the pharmacy team for assistance with medication management. The Population Health team is pleased to engage with this patient at any time in the future upon receipt of referral and should he/she be interested in assistance from the [facility] team.   Penne Lash, RMA Care Guide [facility]  [place], [place] [address] Direct Dial: [phone] [email or web address]

## 2022-11-02 DIAGNOSIS — M79645 Pain in left finger(s): Secondary | ICD-10-CM | POA: Diagnosis not present

## 2022-11-09 DIAGNOSIS — M79645 Pain in left finger(s): Secondary | ICD-10-CM | POA: Diagnosis not present

## 2022-11-11 DIAGNOSIS — F3181 Bipolar II disorder: Secondary | ICD-10-CM | POA: Diagnosis not present

## 2022-11-12 ENCOUNTER — Telehealth: Payer: Self-pay | Admitting: Family Medicine

## 2022-11-12 DIAGNOSIS — E039 Hypothyroidism, unspecified: Secondary | ICD-10-CM

## 2022-11-12 DIAGNOSIS — E1169 Type 2 diabetes mellitus with other specified complication: Secondary | ICD-10-CM

## 2022-11-12 NOTE — Telephone Encounter (Signed)
-----   Message from Alvina Chou sent at 10/27/2022  3:33 PM EDT ----- Regarding: Lab orders for Friday, 6.14.24 Lab orders, thanks

## 2022-11-13 ENCOUNTER — Other Ambulatory Visit (INDEPENDENT_AMBULATORY_CARE_PROVIDER_SITE_OTHER): Payer: Medicare HMO

## 2022-11-13 DIAGNOSIS — E785 Hyperlipidemia, unspecified: Secondary | ICD-10-CM | POA: Diagnosis not present

## 2022-11-13 DIAGNOSIS — E039 Hypothyroidism, unspecified: Secondary | ICD-10-CM

## 2022-11-13 DIAGNOSIS — E1169 Type 2 diabetes mellitus with other specified complication: Secondary | ICD-10-CM

## 2022-11-13 NOTE — Addendum Note (Signed)
Addended by: Damita Lack on: 11/13/2022 02:34 PM   Modules accepted: Orders

## 2022-11-14 LAB — LIPID PANEL
Cholesterol: 172 mg/dL (ref <200)
HDL: 65 mg/dL (ref >=50)
LDL Cholesterol (Calc): 81 mg/dL (calc)
Non-HDL Cholesterol (Calc): 107 mg/dL (calc) (ref <130)
Total CHOL/HDL Ratio: 2.6 (calc) (ref <5.0)
Triglycerides: 160 mg/dL — ABNORMAL HIGH (ref <150)

## 2022-11-14 LAB — TSH: TSH: 9.75 mIU/L — ABNORMAL HIGH (ref 0.40–4.50)

## 2022-11-17 ENCOUNTER — Other Ambulatory Visit: Payer: Self-pay | Admitting: *Deleted

## 2022-11-17 MED ORDER — LEVOTHYROXINE SODIUM 50 MCG PO TABS: 50.0000 ug | ORAL_TABLET | Freq: Every day | ORAL | 1 refills | Status: DC

## 2022-11-17 MED ORDER — ALBUTEROL SULFATE HFA 108 (90 BASE) MCG/ACT IN AERS: 2.0000 | INHALATION_SPRAY | RESPIRATORY_TRACT | 0 refills | Status: DC | PRN

## 2022-11-17 MED ORDER — EZETIMIBE 10 MG PO TABS: 10.0000 mg | ORAL_TABLET | Freq: Every day | ORAL | 1 refills | Status: DC

## 2022-11-17 MED ORDER — POTASSIUM CHLORIDE CRYS ER 10 MEQ PO TBCR: 10.0000 meq | EXTENDED_RELEASE_TABLET | Freq: Every day | ORAL | 1 refills | Status: DC

## 2022-11-22 ENCOUNTER — Other Ambulatory Visit: Payer: Self-pay | Admitting: Internal Medicine

## 2022-11-22 ENCOUNTER — Other Ambulatory Visit: Payer: Self-pay | Admitting: Family Medicine

## 2022-11-23 ENCOUNTER — Other Ambulatory Visit: Payer: Self-pay | Admitting: Internal Medicine

## 2022-11-23 DIAGNOSIS — M79645 Pain in left finger(s): Secondary | ICD-10-CM | POA: Diagnosis not present

## 2022-11-25 ENCOUNTER — Telehealth: Payer: Self-pay | Admitting: Internal Medicine

## 2022-11-25 NOTE — Telephone Encounter (Signed)
LVM for Yohana to return our call

## 2022-11-25 NOTE — Telephone Encounter (Signed)
MEDICATION: Lantus  PHARMACY:    [id number] - [place], [place] - [address] (Ph: [phone])    HAS THE PATIENT CONTACTED THEIR PHARMACY?  Yes  IS THIS A 90 DAY SUPPLY : Yes  IS PATIENT OUT OF MEDICATION: No  IF NOT; HOW MUCH IS LEFT: will be out before 12/04/22 appointment  LAST APPOINTMENT DATE: @2 /5/24  NEXT APPOINTMENT DATE:@7 /09/2022  DO WE HAVE YOUR PERMISSION TO LEAVE A DETAILED MESSAGE?: Yes  OTHER COMMENTS:    **Let patient know to contact pharmacy at the end of the day to make sure medication is ready. **  ** Please notify patient to allow 48-72 hours to process**  **Encourage patient to contact the pharmacy for refills or they can request refills through [facility]**

## 2022-11-27 MED ORDER — INSULIN GLARGINE 100 UNIT/ML SOLOSTAR PEN: PEN_INJECTOR | SUBCUTANEOUS | 2 refills | Status: DC

## 2022-11-27 NOTE — Addendum Note (Signed)
Addended by: Pollie Meyer on: 11/27/2022 10:51 AM   Modules accepted: Orders

## 2022-11-27 NOTE — Telephone Encounter (Signed)
I called and spoke with the patient and clarified if she is taking lantus and she voiced Yes and I added it back to her med list and sent the refill.

## 2022-12-04 ENCOUNTER — Ambulatory Visit (INDEPENDENT_AMBULATORY_CARE_PROVIDER_SITE_OTHER): Payer: BC Managed Care – PPO | Admitting: Internal Medicine

## 2022-12-04 ENCOUNTER — Encounter: Payer: Self-pay | Admitting: Internal Medicine

## 2022-12-04 VITALS — BP 130/80 | HR 68 | Ht 65.5 in | Wt 262.4 lb

## 2022-12-04 DIAGNOSIS — E785 Hyperlipidemia, unspecified: Secondary | ICD-10-CM | POA: Diagnosis not present

## 2022-12-04 DIAGNOSIS — E1159 Type 2 diabetes mellitus with other circulatory complications: Secondary | ICD-10-CM

## 2022-12-04 DIAGNOSIS — E119 Type 2 diabetes mellitus without complications: Secondary | ICD-10-CM

## 2022-12-04 DIAGNOSIS — Z794 Long term (current) use of insulin: Secondary | ICD-10-CM | POA: Diagnosis not present

## 2022-12-04 DIAGNOSIS — E559 Vitamin D deficiency, unspecified: Secondary | ICD-10-CM

## 2022-12-04 LAB — HEMOGLOBIN A1C: Hemoglobin A1C: 7.4

## 2022-12-04 MED ORDER — DAPAGLIFLOZIN PROPANEDIOL 5 MG PO TABS
5.0000 mg | ORAL_TABLET | Freq: Every day | ORAL | 3 refills | Status: DC
Start: 2022-12-04 — End: 2023-04-09
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
## 2022-12-04 NOTE — Patient Instructions (Addendum)
Please continue: - Lantus 30-32 units at bedtime - R insulin: 15 min before meals  20-22 units before b'fast  20 >> 14-16 units before lunch (17-18 units if eating cereals) 20 >> 14-16 units before dinner If you plan to be more active after a meal, please reduce the insulin before that meal by 5 units. If you have to take the insulin after you eat, take only half of the dose.  Try to start: - Farxiga 5 mg before b'fast  Please come back for a follow-up appointment in 4 months.

## 2022-12-09 DIAGNOSIS — M79645 Pain in left finger(s): Secondary | ICD-10-CM | POA: Diagnosis not present

## 2022-12-13 ENCOUNTER — Other Ambulatory Visit: Payer: Self-pay | Admitting: Family Medicine

## 2022-12-16 ENCOUNTER — Other Ambulatory Visit: Payer: Self-pay | Admitting: Internal Medicine

## 2022-12-16 DIAGNOSIS — M79645 Pain in left finger(s): Secondary | ICD-10-CM | POA: Diagnosis not present

## 2022-12-16 DIAGNOSIS — Z794 Long term (current) use of insulin: Secondary | ICD-10-CM

## 2022-12-21 ENCOUNTER — Telehealth: Payer: Self-pay | Admitting: Family Medicine

## 2022-12-21 NOTE — Telephone Encounter (Signed)
I recommend she try the farxiga and if side effects then stop it and let endocrinologist know   If feet continue to swell follow up for a visit  Elevate them when sitting if able Avoid salty processed foods

## 2022-12-21 NOTE — Telephone Encounter (Signed)
Patient called in and stated that she was prescribed dapagliflozin propanediol (FARXIGA) 5 MG TABS tablet by her Endocrinologist doctor. She was wanting to know if this is ok for her take with her diabetes. She stated that she is afraid to start it because of the side effects it may caiuse. She also stated that she is on a fluid pill but its not helping much because her feet are swollen. Please advise. Thank you!

## 2022-12-21 NOTE — Telephone Encounter (Signed)
Pt notified of Dr. Royden Purl comments and recommendations. Pt did want PCP to eval foot so appt scheduled

## 2022-12-23 ENCOUNTER — Ambulatory Visit: Payer: BC Managed Care – PPO | Admitting: Family Medicine

## 2022-12-23 ENCOUNTER — Telehealth: Payer: Self-pay | Admitting: Family Medicine

## 2022-12-23 DIAGNOSIS — E039 Hypothyroidism, unspecified: Secondary | ICD-10-CM

## 2022-12-23 DIAGNOSIS — I10 Essential (primary) hypertension: Secondary | ICD-10-CM

## 2022-12-23 DIAGNOSIS — E1169 Type 2 diabetes mellitus with other specified complication: Secondary | ICD-10-CM

## 2022-12-23 DIAGNOSIS — M79645 Pain in left finger(s): Secondary | ICD-10-CM | POA: Diagnosis not present

## 2022-12-23 DIAGNOSIS — E559 Vitamin D deficiency, unspecified: Secondary | ICD-10-CM

## 2022-12-23 NOTE — Telephone Encounter (Signed)
-----   Message from Alvina Chou sent at 12/04/2022  4:02 PM EDT ----- Regarding: Lab orders for Friday, 7.26.24 Lab orders , thanks, T

## 2022-12-24 ENCOUNTER — Encounter: Payer: Self-pay | Admitting: Family Medicine

## 2022-12-25 ENCOUNTER — Other Ambulatory Visit (INDEPENDENT_AMBULATORY_CARE_PROVIDER_SITE_OTHER): Payer: BC Managed Care – PPO

## 2022-12-25 DIAGNOSIS — E1169 Type 2 diabetes mellitus with other specified complication: Secondary | ICD-10-CM

## 2022-12-25 DIAGNOSIS — E559 Vitamin D deficiency, unspecified: Secondary | ICD-10-CM | POA: Diagnosis not present

## 2022-12-25 DIAGNOSIS — E039 Hypothyroidism, unspecified: Secondary | ICD-10-CM

## 2022-12-25 DIAGNOSIS — E785 Hyperlipidemia, unspecified: Secondary | ICD-10-CM | POA: Diagnosis not present

## 2022-12-25 NOTE — Addendum Note (Signed)
Addended by: Eual Fines on: 12/25/2022 02:30 PM   Modules accepted: Orders

## 2023-01-01 ENCOUNTER — Encounter: Payer: Self-pay | Admitting: Family Medicine

## 2023-01-01 ENCOUNTER — Ambulatory Visit (INDEPENDENT_AMBULATORY_CARE_PROVIDER_SITE_OTHER): Payer: BC Managed Care – PPO | Admitting: Family Medicine

## 2023-01-01 VITALS — BP 92/70 | HR 74 | Temp 97.2°F | Ht 65.0 in | Wt 252.5 lb

## 2023-01-01 DIAGNOSIS — I10 Essential (primary) hypertension: Secondary | ICD-10-CM

## 2023-01-01 DIAGNOSIS — R600 Localized edema: Secondary | ICD-10-CM

## 2023-01-01 NOTE — Progress Notes (Unsigned)
Subjective:    Patient ID: Alexa Woods, female    DOB: 1960/06/02, 62 y.o.   MRN: [id number]  HPI  Wt Readings from Last 3 Encounters:  01/01/23 252 lb 8 oz (114.5 kg)  12/04/22 262 lb 6.4 oz (119 kg)  09/01/22 256 lb 8 oz (116.3 kg)   42.02 kg/m  Vitals:   01/01/23 1536  Pulse: 74  Temp: (!) 97.2 F (36.2 C)  SpO2: 97%     Pt presents for c/o feet swelling   Feet and ankles  2 weeks   Trying to walk more-hurts her back  Sciatic problems  Sitting more when her back hurts  Has air conditioning  Has never worn support garments  At times it makes her feet ache   Trying to lay off the salt  Husband cooks with it   Drinks a lot of water  Blood sugar is up and down   No new cardiac symptoms  No new shortness of breath or cp  Is deconditiong  No orthopnea or PND     Has had bilat  TKA in the past  TSH is improving with more compliant dosing of levothyroxine 50 mcg daily   Lab Results  Component Value Date   TSH 5.79 (H) 12/25/2022   Lab Results  Component Value Date   NA 139 07/22/2022   K 3.9 07/22/2022   CO2 30 07/22/2022   GLUCOSE 299 (H) 07/22/2022   BUN 21 07/22/2022   CREATININE 0.97 07/22/2022   CALCIUM 9.0 07/22/2022   GFR 64.03 07/22/2022   GFRNONAA >60 06/05/2021   Lab Results  Component Value Date   ALT 26 07/22/2022   AST 20 07/22/2022   ALKPHOS 145 (H) 07/22/2022   BILITOT 0.3 07/22/2022     BP Readings from Last 3 Encounters:  01/01/23 92/70  12/04/22 130/80  09/01/22 126/68   Pulse Readings from Last 3 Encounters:  01/01/23 74  12/04/22 68  09/01/22 69   Takes hydrochlorothiazide 25 mg daily      Patient Active Problem List   Diagnosis Date Noted   Frequent falls 08/14/2022   Right-sided chest wall pain 08/14/2022   Statin myopathy 07/29/2022   Itching 07/29/2022   Aortic atherosclerosis (HCC) 07/29/2022   Chronic pain 02/10/2022   Transportation insecurity 05/09/2021   Financial insecurity 05/09/2021    Colon cancer screening 11/08/2020   Current use of proton pump inhibitor 11/04/2020   History of total knee replacement, right 10/21/2020   Trigger finger of right hand 12/20/2019   Dry eyes 09/05/2019   Intractable headache 08/25/2019   Hypothyroidism 04/04/2019   Screening mammogram, encounter for 04/03/2019   Routine general medical examination at a health care facility 04/03/2019   Encounter for screening for HIV 04/03/2019   Vitamin D deficiency 04/03/2019   Intertrigo 02/21/2019   Chronic constipation 12/26/2017   History of left knee replacement 12/26/2017   Primary osteoarthritis involving multiple joints 12/13/2017   Primary osteoarthritis of left knee 12/06/2017   Chronic neck pain 08/13/2017   Pain in left knee 06/22/2017   Morbid obesity (HCC) 01/21/2017   Joint swelling 12/20/2013   Periodontal disease 02/03/2013   Hyperlipidemia associated with type 2 diabetes mellitus (HCC) 09/27/2012   Low back pain 07/22/2010   Hypokalemia 12/12/2008   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   Type 2 diabetes mellitus with peripheral neuropathy (HCC) 11/30/2006   History of alcohol abuse 11/30/2006   Depression with anxiety 11/30/2006   Essential hypertension 11/30/2006  HEMORRHOIDS 11/30/2006   Allergic rhinitis 11/30/2006   Asthma 11/30/2006   GERD without esophagitis 11/30/2006   Psoriasis 11/30/2006   Past Medical History:  Diagnosis Date   Alcohol abuse, in remission    since 1998   Allergic rhinitis    Anemia    Anxiety    Asthma    Bilateral lower extremity edema    Bulging lumbar disc    Bulging of cervical intervertebral disc    Chronic constipation    DDD (degenerative disc disease), lumbosacral    Depression    Dyspnea    Gastroparesis    GERD (gastroesophageal reflux disease)    Hemorrhoids    History of Bell's palsy 08/2007   left   History of chronic cystitis    IBS (irritable bowel syndrome)    Insulin dependent type 2 diabetes mellitus [facility])     endocrinologist-- dr Elvera Lennox   Lower urinary tract symptoms (LUTS)    OA (osteoarthritis)    knees, back, hands, elbows   OSA (obstructive sleep apnea)    per study 06-08-2017 mild osa , cpap recommended , per pt insurance issue   Peripheral neuropathy    PONV (postoperative nausea and vomiting)    Psoriasis    S/P dilatation of esophageal stricture    Unspecified essential hypertension    Wears partial dentures    lower   Past Surgical History:  Procedure Laterality Date   CHOLECYSTECTOMY OPEN  1990   AND APPENDECTOMY   DOBUTAMINE STRESS ECHO  2009    normal stress echo, no evidence of ischemia   ELBOW SURGERY Bilateral RIGHT 2013;  LEFT 2016   for nerve damage   ESOPHAGOGASTRODUODENOSCOPY  07/2002   erythematous gastropathy   eye sugery Bilateral    KNEE ARTHROSCOPY Left 11/ 2018   dr Lequita Halt  @ [facility]   KNEE ARTHROSCOPY W/ PARTIAL MEDIAL MENISCECTOMY Right 10/2008   and chondroplasty   SHOULDER ARTHROSCOPY WITH DISTAL CLAVICLE RESECTION Left 12/ 2011   dr dean   TOTAL KNEE ARTHROPLASTY Right 07-01-2009  dr Lequita Halt   [facility]   TOTAL KNEE ARTHROPLASTY Left 12/06/2017   Procedure: LEFT TOTAL LEFT KNEE ARTHROPLASTY;  Surgeon: Ollen Gross, MD;  Location: [facility];  Service: Orthopedics;  Laterality: Left;   Trigger finger surgery Left 07/2022   VAGINAL HYSTERECTOMY  2003   Social History   Tobacco Use   Smoking status: Former    Current packs/day: 0.00    Types: Cigarettes    Start date: 12/30/1976    Quit date: 12/30/2001    Years since quitting: 21.0   Smokeless tobacco: Never  Vaping Use   Vaping status: Never Used  Substance Use Topics   Alcohol use: No    Alcohol/week: 0.0 standard drinks of alcohol    Comment: hx alcoholism--  stopped 1998    Drug use: No   Family History  Problem Relation Age of Onset   Alcohol abuse Mother    Hypertension Mother    Diabetes Mother    Heart failure Mother    Diabetes Sister    Heart attack Sister    Diabetes Sister     Diabetes Sister    Diabetes Sister    Celiac disease Daughter    Esophageal cancer Maternal Grandmother    Diabetes Brother    Parkinson's disease Brother    Heart attack Brother    Heart attack Brother 28   Diabetes Brother    Diabetes Brother    Diabetes Brother  Thyroid disease Niece    Lung cancer Other        mat great uncle   Colon cancer Neg Hx    Breast cancer Neg Hx    Allergies  Allergen Reactions   Bupropion Hcl Other (See Comments)    Pt is unsure   Cefuroxime Axetil Nausea Only   Crestor [Rosuvastatin Calcium]     Pt states it makes her feel weird    Gabapentin Other (See Comments)    Pt does not remember reaction  Pt does not remember reaction    Lidocaine Other (See Comments)    REACTION: unknown REACTION: unknown   Metformin Other (See Comments)    REACTION: GI REACTION: GI   Other Other (See Comments)   Paroxetine Other (See Comments)    REACTION: doesn't agree REACTION: doesn't agree   Propoxyphene Other (See Comments)    wheezing wheezing   Tramadol Other (See Comments)    REACTION: Causes Anxiety   Tramadol Hcl     REACTION: Causes Anxiety   Wellbutrin [Bupropion] Other (See Comments)    Pt is unsure   Codeine Nausea And Vomiting and Rash   Sulfa Antibiotics Rash and Other (See Comments)   Current Outpatient Medications on File Prior to Visit  Medication Sig Dispense Refill   albuterol (VENTOLIN HFA) 108 (90 Base) MCG/ACT inhaler Inhale 2 puffs into the lungs every 4 (four) hours as needed for wheezing or shortness of breath. 1854 g 0   B-D UF III MINI PEN NEEDLES 31G X 5 MM MISC USE AS INSTRUCTED FOR 4X DAILY INJECTIONS 400 each 1   BD INSULIN SYRINGE U/F 31G X 5/16" 0.5 ML MISC USE THREE TIMES PER DAY WITH R INSULIN & ONCE DAILY WITH LEVEMIR 400 each 2   cholecalciferol (VITAMIN D3) 25 MCG (1000 UNIT) tablet Take 2,000 Units by mouth daily.     Continuous Blood Gluc Receiver (DEXCOM G6 RECEIVER) DEVI Use as directed to check blood sugar.  1 each 0   Continuous Blood Gluc Sensor (DEXCOM G6 SENSOR) MISC Use as directed to check blood sugar. change every 10 days 9 each 3   dapagliflozin propanediol (FARXIGA) 5 MG TABS tablet Take 1 tablet (5 mg total) by mouth daily before breakfast. 90 tablet 3   docusate sodium (COLACE) 100 MG capsule Take 100 mg by mouth every morning.     ezetimibe (ZETIA) 10 MG tablet Take 1 tablet (10 mg total) by mouth daily. 90 tablet 1   fluticasone (FLONASE) 50 MCG/ACT nasal spray Place 2 sprays into both nostrils daily as needed for allergies or rhinitis. 16 g 5   glucose blood (ACCU-CHEK GUIDE) test strip USE TO TEST BLOOD SUGAR 4 TIMES DAILY AS INSTRUCTED. 400 strip 2   hydrochlorothiazide (HYDRODIURIL) 25 MG tablet TAKE 1 TABLET EVERY DAY 90 tablet 1   insulin glargine (LANTUS) 100 UNIT/ML Solostar Pen Inject 30-35 units daily 15 mL 2   Insulin Pen Needle 32G X 4 MM MISC Use 4x a day 300 each 3   Insulin Regular Human (NOVOLIN R FLEXPEN RELION) 100 UNIT/ML KwikPen INJECT 12 TO 24 UNITS UNDER SKIN BEFORE EACH MEAL (Patient taking differently: Inject 18-20 units under skin before each meal) 45 mL 0   Lancets (ONETOUCH ULTRASOFT) lancets Use to test blood sugar 4 times daily as instructed. 200 each 11   levothyroxine (SYNTHROID) 50 MCG tablet Take 1 tablet (50 mcg total) by mouth daily before breakfast. 90 tablet 1   LORazepam (ATIVAN) 2  MG tablet Take 2 mg by mouth 2 (two) times daily.     naloxegol oxalate (MOVANTIK) 25 MG TABS tablet Take 1 tablet (25 mg total) by mouth daily. 30 tablet 3   naloxone (NARCAN) 4 MG/0.1ML LIQD nasal spray kit      ondansetron (ZOFRAN) 4 MG tablet      oxyCODONE-acetaminophen (PERCOCET) 10-325 MG tablet Take 1 tablet by mouth every 4 (four) hours as needed for pain. Hold for SBP < = 105 12 tablet 0   pantoprazole (PROTONIX) 40 MG tablet TAKE 1 TABLET TWICE DAILY 180 tablet 1   potassium chloride (KLOR-CON M10) 10 MEQ tablet Take 1 tablet (10 mEq total) by mouth daily. 90  tablet 1   sertraline (ZOLOFT) 100 MG tablet Take 200 mg by mouth every morning.     tiZANidine (ZANAFLEX) 4 MG tablet Take 4 mg by mouth 2 (two) times daily as needed for muscle spasms.      triamcinolone cream (KENALOG) 0.1 % Apply 1 application topically 2 (two) times daily. To psoriasis areas 30 g 0   zolpidem (AMBIEN) 10 MG tablet Take 10 mg by mouth at bedtime as needed for sleep.     No current facility-administered medications on file prior to visit.    Review of Systems     Objective:   Physical Exam        Assessment & Plan:   Problem List Items Addressed This Visit       Cardiovascular and Mediastinum   Essential hypertension - Primary

## 2023-01-01 NOTE — Patient Instructions (Addendum)
Keep working on weight loss   Take your thyroid medicine on time and daily - don't miss doses    Labs today for swelling in feet and ankles   Get some support hose/socks to the knee and wear them during the day   Weight loss may help also   Watch sodium in diet

## 2023-01-02 NOTE — Assessment & Plan Note (Signed)
I do wonder if she would qualify for or be open to GLP-1 drug  She does have past dm gastroparesis   Plans to discuss with her endocrinologist

## 2023-01-02 NOTE — Assessment & Plan Note (Signed)
bp in fair control at this time  BP Readings from Last 1 Encounters:  01/01/23 92/70   No changes needed but will watch for symptoms of hypotension  Most recent labs reviewed  Disc lifstyle change with low sodium diet and exercise  Stable with hctz 25 mg daily  Sees endo for dm2

## 2023-01-02 NOTE — Assessment & Plan Note (Signed)
No pitting on exam , mild May be some venous insufficiency or lymphedema /worse in summer months  Obesity may play role also  Does take hydrochlorothiazide No cardiac signs and symptoms  Discussed use of support socks/ has some at home If helpful can prescribe stronger ones  Discussed lower sodium diet / increase water intake Elevate feet when sitting  Lab today

## 2023-01-04 ENCOUNTER — Telehealth: Payer: Self-pay | Admitting: Family Medicine

## 2023-01-04 ENCOUNTER — Encounter: Payer: Self-pay | Admitting: Family Medicine

## 2023-01-04 NOTE — Telephone Encounter (Signed)
Patient called in asking about her results from bloodwork that was drawn on 01/01/2023.

## 2023-01-04 NOTE — Telephone Encounter (Signed)
Left VM requesting pt to call the office back 

## 2023-01-06 NOTE — Telephone Encounter (Signed)
Pt called back returning Shapale's call. Told pt Dr. Royden Purl response regarding results. Pt had no questions/concerns. Pt states she has not gotten the support socks yet. Call back # [phone]

## 2023-01-07 DIAGNOSIS — H04123 Dry eye syndrome of bilateral lacrimal glands: Secondary | ICD-10-CM | POA: Diagnosis not present

## 2023-01-07 DIAGNOSIS — H16223 Keratoconjunctivitis sicca, not specified as Sjogren's, bilateral: Secondary | ICD-10-CM | POA: Diagnosis not present

## 2023-01-07 DIAGNOSIS — Z794 Long term (current) use of insulin: Secondary | ICD-10-CM | POA: Diagnosis not present

## 2023-01-07 DIAGNOSIS — E1165 Type 2 diabetes mellitus with hyperglycemia: Secondary | ICD-10-CM | POA: Diagnosis not present

## 2023-01-07 NOTE — Telephone Encounter (Signed)
error 

## 2023-01-14 ENCOUNTER — Telehealth: Payer: Self-pay | Admitting: Family Medicine

## 2023-01-14 NOTE — Telephone Encounter (Signed)
Please triage

## 2023-01-14 NOTE — Telephone Encounter (Signed)
I will cc to her endocrinologist

## 2023-01-14 NOTE — Telephone Encounter (Signed)
Called patient she started  [place] 2 weeks ago. Started having GI symptoms 3 days ago. States she is having "bubbling" sounds in stomach and cramping. She has had more frequent bowel movement that are loose. Denies any blood in stool. She states she has been having multiple bowel movements a day. Her base line is every few days. Denies any vomiting but has had headache and nasua. Her blood sugars have as low as 50's twice after starting. She does not check her readings daily readings have been 150's -50's. I have asked her if she has reached out to Endo but states every time she calls they tell her to check with PCP.   I offered to make app with patient but she would like [place] to send message to provider to get her recommendations. Advised patient if any SOB, selling in face, mouth, extremely low blood sugar readings that are not going up or any other new increased symptoms before she gets call back to go to ED. Patient agreed and will go if needed.

## 2023-01-14 NOTE — Telephone Encounter (Signed)
Patient is requesting a call back when ever possible regarding stomach issues,patient can be reach at [phone]

## 2023-01-15 ENCOUNTER — Other Ambulatory Visit: Payer: Self-pay | Admitting: Family Medicine

## 2023-01-15 NOTE — Telephone Encounter (Signed)
Patient has been notified, but she states that this medication has been helping her weight and she wants to keep trying it. She also mentioned that when she eats certain things her stomach would hurt. FYI.

## 2023-01-15 NOTE — Telephone Encounter (Signed)
J,  These are unusual side effects of Farxiga.  I am not sure if something else is not going on.  However, for now, try to stop [place] for the next 2 weeks and lets see if the symptoms improve.  Let us know if the sugars do not improve afterwards.

## 2023-01-17 ENCOUNTER — Other Ambulatory Visit: Payer: Self-pay | Admitting: Family Medicine

## 2023-01-30 ENCOUNTER — Other Ambulatory Visit: Payer: Self-pay | Admitting: Internal Medicine

## 2023-02-06 DIAGNOSIS — E1165 Type 2 diabetes mellitus with hyperglycemia: Secondary | ICD-10-CM | POA: Diagnosis not present

## 2023-02-06 DIAGNOSIS — Z794 Long term (current) use of insulin: Secondary | ICD-10-CM | POA: Diagnosis not present

## 2023-02-15 ENCOUNTER — Other Ambulatory Visit: Payer: Self-pay | Admitting: Family Medicine

## 2023-02-15 NOTE — Telephone Encounter (Signed)
Needs labs-please schedule lab appointment for TSH  Has she missed any doses:  Let me know and then I will refill

## 2023-02-15 NOTE — Telephone Encounter (Signed)
Looks like you wanted patient to come in for labs. Do not see where that was done. Ok to refill or does she need labs first?

## 2023-02-17 NOTE — Telephone Encounter (Signed)
Appt scheduled on 02/19/23 to recheck TSH, pt said she has enough pills to last until we get results back on Monday, no missed pills that pt is aware of. Rx declined per pt request and will see what lab results are on Friday.  FYI to PCP

## 2023-02-18 ENCOUNTER — Telehealth: Payer: Self-pay | Admitting: Family Medicine

## 2023-02-18 DIAGNOSIS — E039 Hypothyroidism, unspecified: Secondary | ICD-10-CM

## 2023-02-18 NOTE — Telephone Encounter (Signed)
-----   Message from Alvina Chou sent at 02/17/2023  3:54 PM EDT ----- Regarding: Lab oreders for [facility], 9.20.24 Lab orders, thanks

## 2023-02-19 ENCOUNTER — Other Ambulatory Visit (INDEPENDENT_AMBULATORY_CARE_PROVIDER_SITE_OTHER): Payer: BC Managed Care – PPO

## 2023-02-19 DIAGNOSIS — E039 Hypothyroidism, unspecified: Secondary | ICD-10-CM | POA: Diagnosis not present

## 2023-02-19 NOTE — Addendum Note (Signed)
Addended by: Lovena Neighbours on: 02/19/2023 02:36 PM   Modules accepted: Orders

## 2023-02-20 LAB — TSH: TSH: 3.49 mIU/L (ref 0.40–4.50)

## 2023-02-22 ENCOUNTER — Telehealth: Payer: Self-pay | Admitting: *Deleted

## 2023-02-22 MED ORDER — LEVOTHYROXINE SODIUM 50 MCG PO TABS: 50.0000 ug | ORAL_TABLET | Freq: Every day | ORAL | 2 refills | Status: DC

## 2023-02-22 NOTE — Telephone Encounter (Signed)
-----   Message from [facility] sent at 02/20/2023  9:31 AM EDT ----- TSH is in the normal range now  Continue your current dose of levothyroxine  Please refill for year if needed

## 2023-02-22 NOTE — Telephone Encounter (Signed)
Jasmine, Can you please call patient and check about her urinary symptoms? Thank you! CG

## 2023-02-22 NOTE — Telephone Encounter (Signed)
Called pt to advise her of thyroid levels, and she told me that her DM med is causing "urinary issues". I asked pt if she had let Dr. Elvera Lennox know and she said "no she always tells me to talk to yall". I advise pt I can schedule her an appt with a provider here to have urine issues check to rule out UTI. PT declined scheduling an appt, saying she has an Ortho appt tomorrow. I advise pt that Ortho usually wouldn't discuss urinary issues and if she declined to schedule an appt in our office she should at least let Dr. Elvera Lennox know what's going on. Pt said she would let Dr. Donia Ast know and then ended call  FYI to PCP and Dr. Elvera Lennox

## 2023-02-23 DIAGNOSIS — R296 Repeated falls: Secondary | ICD-10-CM | POA: Diagnosis not present

## 2023-02-23 DIAGNOSIS — F1010 Alcohol abuse, uncomplicated: Secondary | ICD-10-CM | POA: Diagnosis not present

## 2023-02-23 DIAGNOSIS — M5416 Radiculopathy, lumbar region: Secondary | ICD-10-CM | POA: Diagnosis not present

## 2023-02-23 DIAGNOSIS — M5459 Other low back pain: Secondary | ICD-10-CM | POA: Diagnosis not present

## 2023-02-23 DIAGNOSIS — Z79899 Other long term (current) drug therapy: Secondary | ICD-10-CM | POA: Diagnosis not present

## 2023-02-23 DIAGNOSIS — Z5181 Encounter for therapeutic drug level monitoring: Secondary | ICD-10-CM | POA: Diagnosis not present

## 2023-02-23 DIAGNOSIS — G894 Chronic pain syndrome: Secondary | ICD-10-CM | POA: Diagnosis not present

## 2023-02-23 DIAGNOSIS — Z79891 Long term (current) use of opiate analgesic: Secondary | ICD-10-CM | POA: Diagnosis not present

## 2023-02-23 DIAGNOSIS — L409 Psoriasis, unspecified: Secondary | ICD-10-CM | POA: Diagnosis not present

## 2023-02-23 DIAGNOSIS — M65311 Trigger thumb, right thumb: Secondary | ICD-10-CM | POA: Diagnosis not present

## 2023-02-23 NOTE — Telephone Encounter (Signed)
LMTRC  JMiller,RMA 

## 2023-03-08 DIAGNOSIS — E1165 Type 2 diabetes mellitus with hyperglycemia: Secondary | ICD-10-CM | POA: Diagnosis not present

## 2023-03-08 DIAGNOSIS — Z794 Long term (current) use of insulin: Secondary | ICD-10-CM | POA: Diagnosis not present

## 2023-03-10 NOTE — Telephone Encounter (Signed)
LMTRC  JMiller,RMA 

## 2023-03-13 ENCOUNTER — Other Ambulatory Visit: Payer: Self-pay | Admitting: Internal Medicine

## 2023-03-13 ENCOUNTER — Other Ambulatory Visit: Payer: Self-pay | Admitting: Family Medicine

## 2023-03-16 ENCOUNTER — Ambulatory Visit (INDEPENDENT_AMBULATORY_CARE_PROVIDER_SITE_OTHER): Payer: BC Managed Care – PPO | Admitting: Family Medicine

## 2023-03-16 ENCOUNTER — Ambulatory Visit (INDEPENDENT_AMBULATORY_CARE_PROVIDER_SITE_OTHER)
Admission: RE | Admit: 2023-03-16 | Discharge: 2023-03-16 | Disposition: A | Discharge status: Home / Self Care | Payer: BC Managed Care – PPO | Source: Ambulatory Visit | Attending: Family Medicine | Admitting: Family Medicine

## 2023-03-16 ENCOUNTER — Encounter: Payer: Self-pay | Admitting: Family Medicine

## 2023-03-16 VITALS — BP 110/68 | HR 54 | Temp 97.7°F | Ht 65.0 in | Wt 250.4 lb

## 2023-03-16 DIAGNOSIS — S4991XA Unspecified injury of right shoulder and upper arm, initial encounter: Secondary | ICD-10-CM

## 2023-03-16 NOTE — Assessment & Plan Note (Signed)
After trip and fall 4 days ago , in pt on chronic narcotic pain medication with widespread OA Some improvement since then  Some limited abduction/ rotation and lateral tenderness on exam Xray  Instructed to use cold compress Given handout with rom exercises   Update if not starting to improve in a week or if worsening  Call back and Er precautions noted in detail today

## 2023-03-16 NOTE — Patient Instructions (Signed)
Use cold compress on shoulder  Do the passive range of motion exercises   Xray now  We will call with report and plan    Be careful not to fall  Take your time
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
## 2023-03-24 ENCOUNTER — Telehealth: Payer: Self-pay | Admitting: Family Medicine

## 2023-03-24 MED ORDER — ALBUTEROL SULFATE HFA 108 (90 BASE) MCG/ACT IN AERS: 2.0000 | INHALATION_SPRAY | RESPIRATORY_TRACT | 2 refills | Status: DC | PRN

## 2023-03-24 NOTE — Telephone Encounter (Signed)
Prescription Request  03/24/2023  LOV: 03/16/2023  What is the name of the medication or equipment? albuterol (VENTOLIN HFA) 108 (90 Base) MCG/ACT inhaler   Have you contacted your pharmacy to request a refill? Yes   Which pharmacy would you like this sent to?  [facility] - [place], [place] - [id number], [address] [place] [address] Phone: [phone] Fax: [phone]  [id number] - [place], [place] - [address] [place] [address] Phone: [phone] Fax: [phone]    Patient notified that their request is being sent to the clinical staff for review and that they should receive a response within 2 business days.   Please advise at Mobile [phone] (mobile)  Pt also stated that her  shoulder is still hurting and what should she do Tower done X-rays on the right shoulder.

## 2023-03-24 NOTE — Telephone Encounter (Signed)
Refilled inhaler Needs to follow up with orthopedic specialist if shoulder still hurts Thanks

## 2023-03-25 NOTE — Telephone Encounter (Signed)
Attempted to contact pt. No answer, vm box full. Need to relay Dr Royden Purl message.

## 2023-03-26 NOTE — Telephone Encounter (Signed)
Mail box full not able to leave message. Patient does not have my chart.

## 2023-03-30 NOTE — Telephone Encounter (Signed)
Called pt and still no answer and VM box is full, will resolve phone note until pt contacts [place] back

## 2023-04-07 DIAGNOSIS — E1165 Type 2 diabetes mellitus with hyperglycemia: Secondary | ICD-10-CM | POA: Diagnosis not present

## 2023-04-07 DIAGNOSIS — Z794 Long term (current) use of insulin: Secondary | ICD-10-CM | POA: Diagnosis not present

## 2023-04-09 ENCOUNTER — Emergency Department (HOSPITAL_COMMUNITY)
Admission: EM | Admit: 2023-04-09 | Discharge: 2023-04-09 | Disposition: A | Discharge status: Home / Self Care | Payer: BC Managed Care – PPO | Attending: Emergency Medicine | Admitting: Emergency Medicine

## 2023-04-09 ENCOUNTER — Ambulatory Visit: Payer: BC Managed Care – PPO | Admitting: Internal Medicine

## 2023-04-09 ENCOUNTER — Emergency Department (HOSPITAL_COMMUNITY): Payer: BC Managed Care – PPO

## 2023-04-09 ENCOUNTER — Encounter: Payer: Self-pay | Admitting: Internal Medicine

## 2023-04-09 ENCOUNTER — Other Ambulatory Visit: Payer: Self-pay

## 2023-04-09 VITALS — BP 140/80 | HR 79 | Resp 20 | Ht 65.0 in | Wt 247.0 lb

## 2023-04-09 DIAGNOSIS — Z1152 Encounter for screening for COVID-19: Secondary | ICD-10-CM | POA: Insufficient documentation

## 2023-04-09 DIAGNOSIS — E559 Vitamin D deficiency, unspecified: Secondary | ICD-10-CM | POA: Diagnosis not present

## 2023-04-09 DIAGNOSIS — E785 Hyperlipidemia, unspecified: Secondary | ICD-10-CM

## 2023-04-09 DIAGNOSIS — E1159 Type 2 diabetes mellitus with other circulatory complications: Secondary | ICD-10-CM

## 2023-04-09 DIAGNOSIS — Z794 Long term (current) use of insulin: Secondary | ICD-10-CM

## 2023-04-09 DIAGNOSIS — J4520 Mild intermittent asthma, uncomplicated: Secondary | ICD-10-CM | POA: Diagnosis not present

## 2023-04-09 DIAGNOSIS — R0602 Shortness of breath: Secondary | ICD-10-CM | POA: Diagnosis not present

## 2023-04-09 LAB — RESP PANEL BY RT-PCR (RSV, FLU A&B, COVID)  RVPGX2
Influenza A by PCR: NEGATIVE
Influenza B by PCR: NEGATIVE
Resp Syncytial Virus by PCR: NEGATIVE
SARS Coronavirus 2 by RT PCR: NEGATIVE

## 2023-04-09 LAB — TROPONIN I (HIGH SENSITIVITY): Troponin I (High Sensitivity): 5 ng/L (ref <18)

## 2023-04-09 LAB — COMPREHENSIVE METABOLIC PANEL
ALT: 21 U/L (ref 0–44)
AST: 21 U/L (ref 15–41)
Albumin: 3.9 g/dL (ref 3.5–5.0)
Alkaline Phosphatase: 108 U/L (ref 38–126)
Anion gap: 10 (ref 5–15)
BUN: 16 mg/dL (ref 6–20)
CO2: 26 mmol/L (ref 22–32)
Calcium: 9 mg/dL (ref 8.9–10.3)
Chloride: 101 mmol/L (ref 98–111)
Creatinine, Ser: 1.02 mg/dL — ABNORMAL HIGH (ref 0.44–1.00)
GFR, Estimated: >60 mL/min (ref >60)
Glucose, Bld: 130 mg/dL — ABNORMAL HIGH (ref 70–99)
Potassium: 3 mmol/L — ABNORMAL LOW (ref 3.5–5.1)
Sodium: 137 mmol/L (ref 135–145)
Total Bilirubin: 0.3 mg/dL (ref <1.2)
Total Protein: 7.6 g/dL (ref 6.5–8.1)

## 2023-04-09 LAB — CBC
HCT: 41.3 % (ref 36.0–46.0)
Hemoglobin: 13.5 g/dL (ref 12.0–15.0)
MCH: 28.1 pg (ref 26.0–34.0)
MCHC: 32.7 g/dL (ref 30.0–36.0)
MCV: 85.9 fL (ref 80.0–100.0)
Platelets: 278 10*3/uL (ref 150–400)
RBC: 4.81 MIL/uL (ref 3.87–5.11)
RDW: 13.4 % (ref 11.5–15.5)
WBC: 7.8 10*3/uL (ref 4.0–10.5)
nRBC: 0 % (ref 0.0–0.2)

## 2023-04-09 MED ORDER — ALBUTEROL SULFATE HFA 108 (90 BASE) MCG/ACT IN AERS
2.0000 | INHALATION_SPRAY | RESPIRATORY_TRACT | Status: DC | PRN
  Administered 2023-04-09: 2 via RESPIRATORY_TRACT
  Filled 2023-04-09: qty 6.7

## 2023-04-09 MED ORDER — PREDNISONE 50 MG PO TABS
50.0000 mg | ORAL_TABLET | Freq: Once | ORAL | Status: AC
  Administered 2023-04-09: 50 mg via ORAL
  Filled 2023-04-09: qty 1

## 2023-04-09 MED ORDER — PREDNISONE 50 MG PO TABS: 50.0000 mg | ORAL_TABLET | Freq: Every day | ORAL | 0 refills | Status: DC

## 2023-04-09 MED ORDER — DAPAGLIFLOZIN PROPANEDIOL 10 MG PO TABS: 10.0000 mg | ORAL_TABLET | Freq: Every day | ORAL | 3 refills | Status: DC

## 2023-04-09 NOTE — Patient Instructions (Signed)
Please continue: - Farxiga 5 mg before b'fast - Lantus 30-32 units at bedtime - R insulin: 15 min before meals  20-22 units before b'fast  14-16 units before lunch (17-18 units if eating cereals) 14-16 units before dinner If you plan to be more active after a meal, please reduce the insulin before that meal by 5 units. If you have to take the insulin after you eat, take only half of the dose.  Please come back for a follow-up appointment in 4 months.
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
## 2023-04-09 NOTE — ED Provider Notes (Signed)
[place] EMERGENCY DEPARTMENT AT [facility] Provider Note   CSN: [id number] Arrival date & time: 04/09/23  1700     History Chief Complaint  Patient presents with   Asthma   Chest Pain    HPI Alexa Woods is a 62 y.o. female presenting for chief complaint of shortness of breath and chest pain.  Patient's recorded medical, surgical, social, medication list and allergies were reviewed in the Snapshot window as part of the initial history.   Review of Systems   Review of Systems  Constitutional:  Negative for chills and fever.  HENT:  Negative for ear pain and sore throat.   Eyes:  Negative for pain and visual disturbance.  Respiratory:  Negative for cough and shortness of breath.   Cardiovascular:  Negative for chest pain and palpitations.  Gastrointestinal:  Negative for abdominal pain and vomiting.  Genitourinary:  Negative for dysuria and hematuria.  Musculoskeletal:  Negative for arthralgias and back pain.  Skin:  Negative for color change and rash.  Neurological:  Negative for seizures and syncope.  All other systems reviewed and are negative.   Physical Exam Updated Vital Signs BP (!) 169/104   Pulse 72   Temp 97.7 F (36.5 C) (Oral)   Resp 13   Ht 5\' 5"  (1.651 m)   Wt 113.4 kg   SpO2 100%   BMI 41.60 kg/m  Physical Exam Vitals and nursing note reviewed.  Constitutional:      General: She is not in acute distress.    Appearance: She is well-developed.  HENT:     Head: Normocephalic and atraumatic.  Eyes:     Conjunctiva/sclera: Conjunctivae normal.  Cardiovascular:     Rate and Rhythm: Normal rate and regular rhythm.     Heart sounds: No murmur heard. Pulmonary:     Effort: Pulmonary effort is normal. No respiratory distress.     Breath sounds: Normal breath sounds.  Abdominal:     General: There is no distension.     Palpations: Abdomen is soft.     Tenderness: There is no abdominal tenderness. There is no right CVA tenderness or  left CVA tenderness.  Musculoskeletal:        General: No swelling or tenderness. Normal range of motion.     Cervical back: Neck supple.  Skin:    General: Skin is warm and dry.  Neurological:     General: No focal deficit present.     Mental Status: She is alert and oriented to person, place, and time. Mental status is at baseline.     Cranial Nerves: No cranial nerve deficit.      ED Course/ Medical Decision Making/ A&P    Procedures Procedures   Medications Ordered in ED Medications  albuterol (VENTOLIN HFA) 108 (90 Base) MCG/ACT inhaler 2 puff (has no administration in time range)  predniSONE (DELTASONE) tablet 50 mg (has no administration in time range)    Medical Decision Making:   72-year-old female presenting with a chief complaint of care chronic shortness of breath.  History of asthma with similar presentations.  States that 4 days ago she had an episode of chest pain.  Now resolved. Concerning her chest pain likely musculoskeletal given her cough in the setting of an asthma exacerbation.  Will evaluate for ACS with single troponin and EKG given duration of symptoms. Concerning her shortness of breath likely asthma exacerbation will evaluate for pneumonia pneumothorax with chest x-ray Will evaluate for underlying metabolic or  hematologic abnormality with screening lab work Reassessment: On reassessment she is symptomatically stable no acute distress she has been in the emergency room for 6 hours and remains in no acute distress with oxygen saturations of 100%.  Evaluation at this point without acute pathology.  PE seems grossly inconsistent with her exam given low Wells score and consistency of presentation with asthma exacerbation.  Will treat with oral steroid burst educated her on controlling her diabetes and closely monitoring her glucose.  Continue albuterol as needed stable for discharge.  Disposition:  I have considered need for hospitalization, however,  considering all of the above, I believe this patient is stable for discharge at this time.  Patient/family educated about specific return precautions for given chief complaint and symptoms.  Patient/family educated about follow-up with PCP.     Patient/family expressed understanding of return precautions and need for follow-up. Patient spoken to regarding all imaging and laboratory results and appropriate follow up for these results. All education provided in verbal form with additional information in written form. Time was allowed for answering of patient questions. Patient discharged.    Emergency Department Medication Summary:   Medications  albuterol (VENTOLIN HFA) 108 (90 Base) MCG/ACT inhaler 2 puff (has no administration in time range)  predniSONE (DELTASONE) tablet 50 mg (has no administration in time range)    Clinical Impression:  1. Shortness of breath   2. Mild intermittent asthma, unspecified whether complicated      Discharge   Final Clinical Impression(s) / ED Diagnoses Final diagnoses:  Shortness of breath  Mild intermittent asthma, unspecified whether complicated    Rx / DC Orders ED Discharge Orders          Ordered    predniSONE (DELTASONE) 50 MG tablet  Daily        04/09/23 2309              Glyn Ade, MD 04/09/23 2318

## 2023-04-09 NOTE — ED Provider Triage Note (Signed)
Emergency Medicine Provider Triage Evaluation Note  Alexa Woods , a 62 y.o. female  was evaluated in triage.  Pt complains of shortness of breath.  Review of Systems  Positive: Shortness of breath, congestion, sore throat, cough, body aches Negative: Fever, N/V/D  Physical Exam  BP 114/66 (BP Location: Right Arm)   Pulse 71   Temp 98 F (36.7 C) (Oral)   Resp (!) 22   Ht 5\' 5"  (1.651 m)   Wt 113.4 kg   SpO2 96%   BMI 41.60 kg/m  Gen:   Awake, no distress   Resp:  Normal effort, no wheezing MSK:   Moves extremities without difficulty  Other:    Medical Decision Making  Medically screening exam initiated at 5:46 PM.  Appropriate orders placed.  CASI ALIMI was informed that the remainder of the evaluation will be completed by another provider, this initial triage assessment does not replace that evaluation, and the importance of remaining in the ED until their evaluation is complete.     Dolphus Jenny, PA-C 04/09/23 [phone]

## 2023-04-09 NOTE — ED Triage Notes (Signed)
Pt c/o asthma exacerbation for 1x week, and chest pain for 4xdays.   AOx4

## 2023-04-09 NOTE — Addendum Note (Signed)
Addended by: Carlus Pavlov on: 04/09/2023 04:38 PM   Modules accepted: Orders

## 2023-04-10 LAB — MICROALBUMIN / CREATININE URINE RATIO
Creatinine, Urine: 128 mg/dL (ref 20–275)
Microalb Creat Ratio: 4 mg/g{creat} (ref <30)
Microalb, Ur: 0.5 mg/dL

## 2023-04-12 ENCOUNTER — Other Ambulatory Visit: Payer: Self-pay

## 2023-04-12 ENCOUNTER — Telehealth: Payer: Self-pay | Admitting: Family Medicine

## 2023-04-12 ENCOUNTER — Emergency Department: Payer: Medicare PPO

## 2023-04-12 ENCOUNTER — Observation Stay
Admission: EM | Admit: 2023-04-12 | Discharge: 2023-04-13 | Disposition: A | Discharge status: Home / Self Care | Payer: BC Managed Care – PPO | Attending: Hospitalist | Admitting: Hospitalist

## 2023-04-12 DIAGNOSIS — I10 Essential (primary) hypertension: Secondary | ICD-10-CM | POA: Insufficient documentation

## 2023-04-12 DIAGNOSIS — E1142 Type 2 diabetes mellitus with diabetic polyneuropathy: Secondary | ICD-10-CM | POA: Diagnosis present

## 2023-04-12 DIAGNOSIS — Z96653 Presence of artificial knee joint, bilateral: Secondary | ICD-10-CM | POA: Insufficient documentation

## 2023-04-12 DIAGNOSIS — R0602 Shortness of breath: Principal | ICD-10-CM

## 2023-04-12 DIAGNOSIS — E876 Hypokalemia: Secondary | ICD-10-CM | POA: Diagnosis not present

## 2023-04-12 DIAGNOSIS — Z79899 Other long term (current) drug therapy: Secondary | ICD-10-CM | POA: Diagnosis not present

## 2023-04-12 DIAGNOSIS — F1011 Alcohol abuse, in remission: Secondary | ICD-10-CM | POA: Diagnosis present

## 2023-04-12 DIAGNOSIS — E1140 Type 2 diabetes mellitus with diabetic neuropathy, unspecified: Secondary | ICD-10-CM | POA: Diagnosis not present

## 2023-04-12 DIAGNOSIS — J45909 Unspecified asthma, uncomplicated: Secondary | ICD-10-CM | POA: Diagnosis not present

## 2023-04-12 DIAGNOSIS — Z7984 Long term (current) use of oral hypoglycemic drugs: Secondary | ICD-10-CM | POA: Insufficient documentation

## 2023-04-12 DIAGNOSIS — R0789 Other chest pain: Secondary | ICD-10-CM | POA: Diagnosis not present

## 2023-04-12 DIAGNOSIS — E039 Hypothyroidism, unspecified: Secondary | ICD-10-CM | POA: Insufficient documentation

## 2023-04-12 DIAGNOSIS — K219 Gastro-esophageal reflux disease without esophagitis: Secondary | ICD-10-CM | POA: Diagnosis present

## 2023-04-12 DIAGNOSIS — Z794 Long term (current) use of insulin: Secondary | ICD-10-CM | POA: Diagnosis not present

## 2023-04-12 DIAGNOSIS — R079 Chest pain, unspecified: Secondary | ICD-10-CM | POA: Diagnosis not present

## 2023-04-12 DIAGNOSIS — Z87891 Personal history of nicotine dependence: Secondary | ICD-10-CM | POA: Insufficient documentation

## 2023-04-12 LAB — CBC
HCT: 38.5 % (ref 36.0–46.0)
Hemoglobin: 12.7 g/dL (ref 12.0–15.0)
MCH: 27.5 pg (ref 26.0–34.0)
MCHC: 33 g/dL (ref 30.0–36.0)
MCV: 83.3 fL (ref 80.0–100.0)
Platelets: 310 10*3/uL (ref 150–400)
RBC: 4.62 MIL/uL (ref 3.87–5.11)
RDW: 13.3 % (ref 11.5–15.5)
WBC: 10.4 10*3/uL (ref 4.0–10.5)
nRBC: 0 % (ref 0.0–0.2)

## 2023-04-12 LAB — BASIC METABOLIC PANEL
Anion gap: 8 (ref 5–15)
BUN: 21 mg/dL — ABNORMAL HIGH (ref 6–20)
CO2: 28 mmol/L (ref 22–32)
Calcium: 9.1 mg/dL (ref 8.9–10.3)
Chloride: 105 mmol/L (ref 98–111)
Creatinine, Ser: 0.98 mg/dL (ref 0.44–1.00)
GFR, Estimated: >60 mL/min (ref >60)
Glucose, Bld: 55 mg/dL — ABNORMAL LOW (ref 70–99)
Potassium: 3 mmol/L — ABNORMAL LOW (ref 3.5–5.1)
Sodium: 141 mmol/L (ref 135–145)

## 2023-04-12 LAB — CBG MONITORING, ED: Glucose-Capillary: 88 mg/dL (ref 70–99)

## 2023-04-12 LAB — TROPONIN I (HIGH SENSITIVITY): Troponin I (High Sensitivity): 7 ng/L (ref <18)

## 2023-04-12 MED ORDER — NITROGLYCERIN 0.4 MG SL SUBL: 0.4000 mg | SUBLINGUAL_TABLET | SUBLINGUAL | Status: DC | PRN

## 2023-04-12 MED ORDER — ASPIRIN 81 MG PO CHEW
324.0000 mg | CHEWABLE_TABLET | Freq: Once | ORAL | Status: AC
  Administered 2023-04-13: 324 mg via ORAL
  Filled 2023-04-12: qty 4

## 2023-04-12 MED ORDER — IPRATROPIUM-ALBUTEROL 0.5-2.5 (3) MG/3ML IN SOLN
6.0000 mL | Freq: Once | RESPIRATORY_TRACT | Status: AC
  Administered 2023-04-12: 6 mL via RESPIRATORY_TRACT
  Filled 2023-04-12: qty 6

## 2023-04-12 MED ORDER — FAMOTIDINE 20 MG PO TABS
20.0000 mg | ORAL_TABLET | Freq: Once | ORAL | Status: AC
  Administered 2023-04-12: 20 mg via ORAL
  Filled 2023-04-12: qty 1

## 2023-04-12 NOTE — Telephone Encounter (Signed)
I spoke with pt; pt was seen [facility] ED on 04/09/23; pt is concerned she might be starting CHF. Pt said this is the worst trouble she has had getting her breath. Pt said any exertion and it feels like  there is heaviness on pt's chest;  pt said just sitting now she is SOB and some trouble getting her breath and CP;pt does not want EMS called..pt has no way to ck BP or P.pt finished taking prednisone today but pt said that did no help her breathing much; pt said the inhaler helps for very short period of time. Pt having to pause speaking to breathe while talking.some mention of atelectosis on CXR on 04/09/23. Pt said her face is swollen and lower legs are swollen. Pt said slight swelling in abd.pt does not have O2 in home.  Pt called her husband and asked me to explain to pts husband that pt needs to be seen today. Pts husband said that he is on his way home and should be there in 15 mins. I remained on phone with pt until her husband got home. Pt will be going to [facility] ED now. Sending note to Dr Milinda Antis and [employer].

## 2023-04-12 NOTE — Telephone Encounter (Signed)
Called and pt stated she is still SOB and having chest pain, call routed to Triage nurse

## 2023-04-12 NOTE — ED Provider Notes (Signed)
[facility] Provider Note    Event Date/Time   First MD Initiated Contact with Patient 04/12/23 1954     (approximate)   History   Shortness of Breath   HPI  Alexa Woods is a 62 y.o. female past medical history significant for asthma, obesity, hypertension, hyperlipidemia, presents to the emergency department with shortness of breath and chest pain.  Endorses ongoing shortness of breath that has been present for the past week.  Evaluated by in the emergency department on Thursday and diagnosed with an asthma exacerbation and just completed a course of steroids into the last steroid today.  States that she has been having worsening pain in her chest with exertion.  Feels a tightness sensation and worsening shortness of breath.  States that she is only able to walk across the room before feeling her symptoms.  Worsening indigestion over the past day.  Denies any nausea or vomiting.  Denies any history of DVT or PE.  Endorses family history of coronary artery disease at a young age.  Last stress test was exercise stress test in 2009.     Physical Exam   Triage Vital Signs: ED Triage Vitals  Encounter Vitals Group     BP 04/12/23 1704 112/61     Systolic BP Percentile --      Diastolic BP Percentile --      Pulse Rate 04/12/23 1704 (!) 51     Resp 04/12/23 1704 18     Temp 04/12/23 1704 97.9 F (36.6 C)     Temp src --      SpO2 04/12/23 1704 94 %     Weight --      Height --      Head Circumference --      Peak Flow --      Pain Score 04/12/23 1703 3     Pain Loc --      Pain Education --      Exclude from Growth Chart --     Most recent vital signs: Vitals:   04/12/23 2252 04/12/23 2254  BP:    Pulse: 96 (!) 109  Resp:    Temp:    SpO2: 97% 97%    Physical Exam Constitutional:      Appearance: She is well-developed.  HENT:     Head: Atraumatic.  Eyes:     Conjunctiva/sclera: Conjunctivae normal.  Cardiovascular:     Rate and  Rhythm: Regular rhythm.  Pulmonary:     Effort: No respiratory distress.  Abdominal:     General: There is no distension.  Musculoskeletal:        General: Normal range of motion.     Cervical back: Normal range of motion.     Right lower leg: No edema.     Left lower leg: No edema.  Skin:    General: Skin is warm.  Neurological:     Mental Status: She is alert. Mental status is at baseline.     IMPRESSION / MDM / ASSESSMENT AND PLAN / ED COURSE  I reviewed the triage vital signs and the nursing notes.  Differential diagnosis including asthma exacerbation, pneumonia, ACS, anemia  EKG  I, Corena Herter, the attending physician, personally viewed and interpreted this ECG.   Rate: Normal  Rhythm: Normal sinus  Axis: Normal  Intervals: Normal  ST&T Change: None   RADIOLOGY I independently reviewed imaging, my interpretation of imaging: Chest x-ray with no signs of pneumonia  LABS (all labs ordered are listed, but only abnormal results are displayed) Labs interpreted as -    Labs Reviewed  BASIC METABOLIC PANEL - Abnormal; Notable for the following components:      Result Value   Potassium 3.0 (*)    Glucose, Bld 55 (*)    BUN 21 (*)    All other components within normal limits  CBC  CBG MONITORING, ED  TROPONIN I (HIGH SENSITIVITY)     MDM    Glucose low on lab work.  Given p.o. replacement and improvement of glucose level.  No significant wheezing on my exam.  Did give a DuoNeb treatment given her shortness of breath but no significant improvement of her symptoms.  Concern for possible ACS.  High risk heart score.  On chart review exercise stress test in 2009 that was negative.  Patient with multiple risk factors and high risk heart score.  Given aspirin and nitroglycerin.   Consulted hospitalist for admission.   PROCEDURES:  Critical Care performed: No  Procedures  Patient's presentation is most consistent with acute presentation with potential  threat to life or bodily function.   MEDICATIONS ORDERED IN ED: Medications  aspirin chewable tablet 324 mg (has no administration in time range)  nitroGLYCERIN (NITROSTAT) SL tablet 0.4 mg (has no administration in time range)  ipratropium-albuterol (DUONEB) 0.5-2.5 (3) MG/3ML nebulizer solution 6 mL (6 mLs Nebulization Given 04/12/23 2202)  famotidine (PEPCID) tablet 20 mg (20 mg Oral Given 04/12/23 2226)    FINAL CLINICAL IMPRESSION(S) / ED DIAGNOSES   Final diagnoses:  SOB (shortness of breath)  Chest pain, unspecified type     Rx / DC Orders   ED Discharge Orders     None        Note:  This document was prepared using Dragon voice recognition software and may include unintentional dictation errors.   Corena Herter, MD 04/13/23 [phone]

## 2023-04-12 NOTE — ED Notes (Addendum)
Pt reports SOB with exertion x1 week and high blood sugars for the last few days - taking prednisone. Pt reports taking prescription diet medication for the last 2 months and losing 15 lbs. Pt reports feeling "uneasy" after getting blood drawn today at 1704 and requesting/drinking orange juice - BMP glucose result from 1704 was 55. POC CBG checked at 2137 and result was 88.

## 2023-04-12 NOTE — ED Triage Notes (Signed)
Pt comes via POV from home with c/o increased sob. Pt was seen at [facility] for same and was prescribed meds and finished all the meds. Pt states no relief. Pt states increased sob with exertion.   Pt does have hx of asthma.

## 2023-04-12 NOTE — Telephone Encounter (Signed)
Agree with that advisement Will watch for correspondence  

## 2023-04-12 NOTE — Telephone Encounter (Signed)
Patient is requesting a callback when able regarding recent visit to the ER, can be reached at mobile number

## 2023-04-13 ENCOUNTER — Observation Stay: Payer: BC Managed Care – PPO

## 2023-04-13 ENCOUNTER — Observation Stay (HOSPITAL_BASED_OUTPATIENT_CLINIC_OR_DEPARTMENT_OTHER)
Admit: 2023-04-13 | Discharge: 2023-04-13 | Disposition: A | Discharge status: Home / Self Care | Payer: Medicare PPO | Attending: Internal Medicine | Admitting: Internal Medicine

## 2023-04-13 ENCOUNTER — Observation Stay: Payer: Medicare PPO

## 2023-04-13 DIAGNOSIS — R0789 Other chest pain: Secondary | ICD-10-CM | POA: Diagnosis not present

## 2023-04-13 DIAGNOSIS — R079 Chest pain, unspecified: Principal | ICD-10-CM

## 2023-04-13 LAB — TROPONIN I (HIGH SENSITIVITY)
Troponin I (High Sensitivity): 7 ng/L (ref <18)
Troponin I (High Sensitivity): 7 ng/L (ref <18)

## 2023-04-13 LAB — NM MYOCAR MULTI W/SPECT W/WALL MOTION / EF
Base ST Depression (mm): 0 mm
Estimated workload: 1
Exercise duration (min): 1 min
LV dias vol: 89 mL (ref 46–106)
LV sys vol: 36 mL
MPHR: 160 {beats}/min
Nuc Stress EF: 60 %
Peak HR: 76 {beats}/min
Percent HR: 47 %
Rest HR: 55 {beats}/min
Rest Nuclear Isotope Dose: 10 mCi
SDS: 2
SRS: 6
SSS: 7
ST Depression (mm): 0 mm
Stress Nuclear Isotope Dose: 31.9 mCi
TID: 1

## 2023-04-13 LAB — ECHOCARDIOGRAM COMPLETE
AR max vel: 2.8 cm2
AV Area VTI: 2.98 cm2
AV Area mean vel: 2.98 cm2
AV Mean grad: 4 mm[Hg]
AV Peak grad: 8 mm[Hg]
Ao pk vel: 1.41 m/s
Area-P 1/2: 3.4 cm2
MV VTI: 2.99 cm2
S' Lateral: 2.8 cm

## 2023-04-13 LAB — CBG MONITORING, ED
Glucose-Capillary: 290 mg/dL — ABNORMAL HIGH (ref 70–99)
Glucose-Capillary: 220 mg/dL — ABNORMAL HIGH (ref 70–99)
Glucose-Capillary: 165 mg/dL — ABNORMAL HIGH (ref 70–99)
Glucose-Capillary: 200 mg/dL — ABNORMAL HIGH (ref 70–99)

## 2023-04-13 LAB — HEPATIC FUNCTION PANEL
ALT: 21 U/L (ref 0–44)
AST: 20 U/L (ref 15–41)
Albumin: 3.6 g/dL (ref 3.5–5.0)
Alkaline Phosphatase: 91 U/L (ref 38–126)
Bilirubin, Direct: <0.1 mg/dL (ref 0.0–0.2)
Total Bilirubin: 0.3 mg/dL (ref <1.2)
Total Protein: 7.3 g/dL (ref 6.5–8.1)

## 2023-04-13 LAB — T4, FREE: Free T4: 1.1 ng/dL (ref 0.61–1.12)

## 2023-04-13 LAB — LIPID PANEL
Cholesterol: 180 mg/dL (ref 0–200)
HDL: 95 mg/dL (ref >40)
LDL Cholesterol: 71 mg/dL (ref 0–99)
Total CHOL/HDL Ratio: 1.9 {ratio}
Triglycerides: 71 mg/dL (ref <150)
VLDL: 14 mg/dL (ref 0–40)

## 2023-04-13 LAB — D-DIMER, QUANTITATIVE: D-Dimer, Quant: 0.34 ug{FEU}/mL (ref 0.00–0.50)

## 2023-04-13 LAB — HIV ANTIBODY (ROUTINE TESTING W REFLEX): HIV Screen 4th Generation wRfx: NONREACTIVE

## 2023-04-13 LAB — TSH: TSH: 0.556 u[IU]/mL (ref 0.350–4.500)

## 2023-04-13 LAB — ETHANOL: Alcohol, Ethyl (B): <10 mg/dL (ref <10)

## 2023-04-13 LAB — MAGNESIUM: Magnesium: 2.3 mg/dL (ref 1.7–2.4)

## 2023-04-13 LAB — BRAIN NATRIURETIC PEPTIDE: B Natriuretic Peptide: 120.2 pg/mL — ABNORMAL HIGH (ref 0.0–100.0)

## 2023-04-13 MED ORDER — OXYCODONE HCL 5 MG PO TABS
5.0000 mg | ORAL_TABLET | Freq: Three times a day (TID) | ORAL | Status: DC | PRN
  Filled 2023-04-13: qty 1

## 2023-04-13 MED ORDER — ONDANSETRON HCL 4 MG/2ML IJ SOLN: 4.0000 mg | Freq: Four times a day (QID) | INTRAMUSCULAR | Status: DC | PRN

## 2023-04-13 MED ORDER — INSULIN ASPART 100 UNIT/ML IJ SOLN: 0.0000 [IU] | INTRAMUSCULAR | Status: DC | PRN

## 2023-04-13 MED ORDER — OXYCODONE-ACETAMINOPHEN 10-325 MG PO TABS: 1.0000 | ORAL_TABLET | Freq: Three times a day (TID) | ORAL | Status: DC | PRN

## 2023-04-13 MED ORDER — IOHEXOL 350 MG/ML SOLN
75.0000 mL | Freq: Once | INTRAVENOUS | Status: AC | PRN
  Administered 2023-04-13: 75 mL via INTRAVENOUS

## 2023-04-13 MED ORDER — ENOXAPARIN SODIUM 60 MG/0.6ML IJ SOSY: 55.0000 mg | PREFILLED_SYRINGE | INTRAMUSCULAR | Status: DC

## 2023-04-13 MED ORDER — TECHNETIUM TC 99M TETROFOSMIN IV KIT
30.0000 | PACK | Freq: Once | INTRAVENOUS | Status: AC | PRN
  Administered 2023-04-13: 31.92 via INTRAVENOUS

## 2023-04-13 MED ORDER — IOHEXOL 350 MG/ML SOLN: 100.0000 mL | Freq: Once | INTRAVENOUS | Status: DC | PRN

## 2023-04-13 MED ORDER — INSULIN ASPART 100 UNIT/ML IJ SOLN: 0.0000 [IU] | Freq: Every day | INTRAMUSCULAR | Status: DC

## 2023-04-13 MED ORDER — HEPARIN SODIUM (PORCINE) 5000 UNIT/ML IJ SOLN
5000.0000 [IU] | Freq: Three times a day (TID) | INTRAMUSCULAR | Status: DC
  Administered 2023-04-13 (×2): 5000 [IU] via SUBCUTANEOUS
  Filled 2023-04-13 (×2): qty 1

## 2023-04-13 MED ORDER — HYDROCHLOROTHIAZIDE 25 MG PO TABS: 25.0000 mg | ORAL_TABLET | Freq: Every day | ORAL | Status: DC

## 2023-04-13 MED ORDER — ACETAMINOPHEN 325 MG PO TABS: 650.0000 mg | ORAL_TABLET | ORAL | Status: DC | PRN

## 2023-04-13 MED ORDER — POTASSIUM CHLORIDE CRYS ER 20 MEQ PO TBCR: 40.0000 meq | EXTENDED_RELEASE_TABLET | Freq: Once | ORAL | Status: DC

## 2023-04-13 MED ORDER — HYDRALAZINE HCL 20 MG/ML IJ SOLN: 10.0000 mg | Freq: Four times a day (QID) | INTRAMUSCULAR | Status: DC | PRN

## 2023-04-13 MED ORDER — FLUTICASONE PROPIONATE 50 MCG/ACT NA SUSP: 2.0000 | Freq: Every day | NASAL | Status: DC | PRN

## 2023-04-13 MED ORDER — ALBUTEROL SULFATE HFA 108 (90 BASE) MCG/ACT IN AERS: 2.0000 | INHALATION_SPRAY | RESPIRATORY_TRACT | Status: DC | PRN

## 2023-04-13 MED ORDER — HYDROCHLOROTHIAZIDE 12.5 MG PO TABS: 12.5000 mg | ORAL_TABLET | Freq: Two times a day (BID) | ORAL | Status: DC

## 2023-04-13 MED ORDER — ALBUTEROL SULFATE (2.5 MG/3ML) 0.083% IN NEBU: 2.5000 mg | INHALATION_SOLUTION | RESPIRATORY_TRACT | Status: DC | PRN

## 2023-04-13 MED ORDER — SERTRALINE HCL 50 MG PO TABS
200.0000 mg | ORAL_TABLET | Freq: Every day | ORAL | Status: DC
  Administered 2023-04-13: 200 mg via ORAL
  Filled 2023-04-13: qty 4

## 2023-04-13 MED ORDER — OXYCODONE-ACETAMINOPHEN 5-325 MG PO TABS
1.0000 | ORAL_TABLET | Freq: Three times a day (TID) | ORAL | Status: DC | PRN
  Filled 2023-04-13: qty 1

## 2023-04-13 MED ORDER — LORAZEPAM 2 MG PO TABS: 2.0000 mg | ORAL_TABLET | Freq: Two times a day (BID) | ORAL | Status: DC | PRN

## 2023-04-13 MED ORDER — INSULIN ASPART 100 UNIT/ML IJ SOLN
0.0000 [IU] | Freq: Three times a day (TID) | INTRAMUSCULAR | Status: DC
  Administered 2023-04-13: 3 [IU] via SUBCUTANEOUS
  Filled 2023-04-13: qty 1

## 2023-04-13 MED ORDER — TECHNETIUM TC 99M TETROFOSMIN IV KIT
10.0000 | PACK | Freq: Once | INTRAVENOUS | Status: AC | PRN
  Administered 2023-04-13: 9.98 via INTRAVENOUS

## 2023-04-13 MED ORDER — PANTOPRAZOLE SODIUM 40 MG PO TBEC
40.0000 mg | DELAYED_RELEASE_TABLET | Freq: Two times a day (BID) | ORAL | Status: DC
  Administered 2023-04-13: 40 mg via ORAL
  Filled 2023-04-13: qty 1

## 2023-04-13 MED ORDER — ASPIRIN 81 MG PO TBEC
81.0000 mg | DELAYED_RELEASE_TABLET | Freq: Every day | ORAL | Status: DC
  Administered 2023-04-13: 81 mg via ORAL
  Filled 2023-04-13: qty 1

## 2023-04-13 MED ORDER — LEVOTHYROXINE SODIUM 50 MCG PO TABS
50.0000 ug | ORAL_TABLET | Freq: Every day | ORAL | Status: DC
  Administered 2023-04-13: 50 ug via ORAL
  Filled 2023-04-13: qty 1

## 2023-04-13 MED ORDER — REGADENOSON 0.4 MG/5ML IV SOLN
0.4000 mg | Freq: Once | INTRAVENOUS | Status: AC
  Administered 2023-04-13: 0.4 mg via INTRAVENOUS
  Filled 2023-04-13: qty 5

## 2023-04-13 MED ORDER — HYDROCHLOROTHIAZIDE 25 MG PO TABS
25.0000 mg | ORAL_TABLET | Freq: Every day | ORAL | Status: DC
  Administered 2023-04-13: 25 mg via ORAL
  Filled 2023-04-13: qty 2

## 2023-04-13 MED ORDER — ZOLPIDEM TARTRATE 5 MG PO TABS: 5.0000 mg | ORAL_TABLET | Freq: Every evening | ORAL | Status: DC | PRN

## 2023-04-13 NOTE — ED Notes (Signed)
Pt returned from stress test

## 2023-04-13 NOTE — Assessment & Plan Note (Signed)
Continue with PPI therapy and as needed Zofran.

## 2023-04-13 NOTE — Assessment & Plan Note (Addendum)
D/D include cardiac related VTE,GERD variant asthma related..   Hiatal hernia, pericardial effusion, pneumonia. Will also order a stress test, Lexiscan for evaluation of ischemic heart disease. Pt is at moderate to high risk for either PE or Coronary ischemia.  We will therefore proceed with CTA chest and stress test.  Cardiology consult as needed per AM team. Will also continue with IV PPIs.,  Will also continue with as needed albuterol and MDIs. Continue with.  Nitroglycerin.  As needed morphine.  Supplemental oxygen.

## 2023-04-13 NOTE — Assessment & Plan Note (Addendum)
Vitals:   04/12/23 1704 04/13/23 0039  BP: 112/61 (!) 159/68  Home regimen consists of hydrochlorothiazide 25 mg. We will continue the same but at 12.5 mg and bid and if bp drops then we will titrate dose.  Monitor blood pressure closely.

## 2023-04-13 NOTE — Assessment & Plan Note (Signed)
Will check patient's TSH and free T4 lipid panel and continue levothyroxine at 50 mcg.

## 2023-04-13 NOTE — Assessment & Plan Note (Signed)
Replace/ follow level. Pharmacy consult.

## 2023-04-13 NOTE — Assessment & Plan Note (Signed)
Clinically stable no wheezing. As needed albuterol.  Pulse oximetry with vitals.

## 2023-04-13 NOTE — Discharge Summary (Addendum)
Physician Discharge Summary   Alexa Woods  female DOB: 06/11/1960  VHQ:[id number]  PCP: Judy Pimple, MD  Admit date: 04/12/2023 Discharge date: 04/13/2023  Admitted From: home Disposition:  home CODE STATUS: Full code  Discharge Instructions     Diet Carb Modified   Complete by: As directed    Discharge instructions   Complete by: As directed    Your stress test is normal.  The ultrasound of your heart is normal. [facility] Course:  For full details, please see H&P, progress notes, consult notes and ancillary notes.  Briefly,  Alexa Woods is a 62 y.o. female with medical history significant for asthma, obesity, allergies to 14 medications, hypertension, hyperlipidemia, presented to the emergency department with shortness of breath and chest pain.    Patient was seen on 04/09/23 in the emergency room for shortness of breath and was treated with steroids for asthma exacerbation.  Chest pain has worsened over the past few days and now feels tight and notices it more with ambulation. Patient is a limited historian, does report left-sided chest pain under her breast tender to palpation.   * Chest pain, ACS ruled out --trop neg.  Echo wnl.  Nuclear stress test was neg.    Dyspnea on exertion Hx of Asthma Clinically stable, no wheezing.  No respiratory distress or hypoxia. CTA neg for PE, and showed "Elevation of the right hemidiaphragm with areas of atelectasis and/or scarring in the right lung base."  Hypothyroidism --cont Synthroid  Essential hypertension --pt was not taking hydrochlorothiazide PTA.  BP was elevated, hydrochlorothiazide resumed.   History of alcohol abuse   Hypokalemia --monitored and supplemented PRN   Type 2 diabetes mellitus with peripheral neuropathy (HCC) Last A1c in 11/2022 is 7.4.  Discharged back on home regimen as below.  GERD --pt was not taking protonix PTA.   Unless noted above, medications under "STOP" list are  ones pt was not taking PTA.  Discharge Diagnoses:  Principal Problem:   Chest pain Active Problems:   Type 2 diabetes mellitus with peripheral neuropathy (HCC)   Hypokalemia   History of alcohol abuse   Essential hypertension   Asthma   GERD without esophagitis   Hypothyroidism     Discharge Instructions:  Allergies as of 04/13/2023       Reactions   Bupropion Hcl Other (See Comments)   Pt is unsure   Cefuroxime Axetil Nausea Only   Crestor [rosuvastatin Calcium]    Pt states it makes her feel weird   Gabapentin Other (See Comments)   Pt does not remember reaction  Pt does not remember reaction    Lidocaine Other (See Comments)   REACTION: unknown REACTION: unknown   Metformin Other (See Comments)   REACTION: GI REACTION: GI   Other Other (See Comments)   Paroxetine Other (See Comments)   REACTION: doesn't agree REACTION: doesn't agree   Propoxyphene Other (See Comments)   wheezing wheezing   Tramadol Other (See Comments)   REACTION: Causes Anxiety   Tramadol Hcl    REACTION: Causes Anxiety   Wellbutrin [bupropion] Other (See Comments)   Pt is unsure   Codeine Nausea And Vomiting, Rash   Sulfa Antibiotics Rash, Other (See Comments)        Medication List     STOP taking these medications    naloxegol oxalate 25 MG Tabs tablet Commonly known as: Movantik   pantoprazole 40 MG tablet Commonly known as:  PROTONIX   predniSONE 50 MG tablet Commonly known as: DELTASONE   triamcinolone cream 0.1 % Commonly known as: KENALOG       TAKE these medications    Accu-Chek Guide test strip Generic drug: glucose blood USE TO TEST BLOOD SUGAR 4 TIMES DAILY AS INSTRUCTED.   albuterol 108 (90 Base) MCG/ACT inhaler Commonly known as: VENTOLIN HFA Inhale 2 puffs into the lungs every 4 (four) hours as needed for wheezing or shortness of breath.   BD Insulin Syringe U/F 31G X 5/16" 0.5 ML Misc Generic drug: Insulin Syringe-Needle U-100 USE THREE TIMES  PER DAY WITH R INSULIN & ONCE DAILY WITH LEVEMIR   cholecalciferol 25 MCG (1000 UNIT) tablet Commonly known as: VITAMIN D3 Take 2,000 Units by mouth daily.   dapagliflozin propanediol 10 MG Tabs tablet Commonly known as: Farxiga Take 1 tablet (10 mg total) by mouth daily before breakfast. What changed: how much to take   Dexcom G6 Receiver [facility] Use as directed to check blood sugar.   Dexcom G6 Sensor Misc Use as directed to check blood sugar. change every 10 days   docusate sodium 100 MG capsule Commonly known as: COLACE Take 100 mg by mouth every morning.   ezetimibe 10 MG tablet Commonly known as: Zetia Take 1 tablet (10 mg total) by mouth daily.   fluticasone 50 MCG/ACT nasal spray Commonly known as: FLONASE PLACE 2 SPRAYS INTO BOTH NOSTRILS DAILY AS NEEDED FOR ALLERGIES OR RHINITIS.   hydrochlorothiazide 25 MG tablet Commonly known as: HYDRODIURIL TAKE 1 TABLET EVERY DAY   Insulin Pen Needle 32G X 4 MM Misc Use 4x a day   B-D UF III MINI PEN NEEDLES 31G X 5 MM Misc Generic drug: Insulin Pen Needle USE AS INSTRUCTED FOR 4X DAILY INJECTIONS   Klor-Con M10 10 MEQ tablet Generic drug: potassium chloride TAKE 1 TABLET BY MOUTH EVERY DAY   Lantus SoloStar 100 UNIT/ML Solostar Pen Generic drug: insulin glargine INJECT 30-35 UNITS DAILY   levothyroxine 50 MCG tablet Commonly known as: SYNTHROID Take 1 tablet (50 mcg total) by mouth daily before breakfast.   LORazepam 2 MG tablet Commonly known as: ATIVAN Take 2 mg by mouth 2 (two) times daily.   Narcan 4 MG/0.1ML Liqd nasal spray kit Generic drug: naloxone   NovoLIN R FlexPen ReliOn 100 UNIT/ML FlexPen Generic drug: Insulin Regular Human Inject 18-20 units under skin before each meal What changed:  how much to take how to take this when to take this additional instructions   onetouch ultrasoft lancets Use to test blood sugar 4 times daily as instructed.   oxyCODONE-acetaminophen 10-325 MG  tablet Commonly known as: PERCOCET Take 1 tablet by mouth every 4 (four) hours as needed for pain. Hold for SBP < = 105   Restasis 0.05 % ophthalmic emulsion Generic drug: cycloSPORINE Place 1 drop into both eyes 2 (two) times daily.   sertraline 100 MG tablet Commonly known as: ZOLOFT Take 200 mg by mouth every morning.   tiZANidine 4 MG tablet Commonly known as: ZANAFLEX Take 4 mg by mouth 2 (two) times daily as needed for muscle spasms.   zolpidem 10 MG tablet Commonly known as: AMBIEN Take 10 mg by mouth at bedtime as needed for sleep.         Follow-up Information     Tower, Audrie Gallus, MD Follow up in 1 week(s).   Specialties: Family Medicine, Radiology Contact information: [address] [place] [address] [phone]  Allergies  Allergen Reactions   Bupropion Hcl Other (See Comments)    Pt is unsure   Cefuroxime Axetil Nausea Only   Crestor [Rosuvastatin Calcium]     Pt states it makes her feel weird    Gabapentin Other (See Comments)    Pt does not remember reaction  Pt does not remember reaction    Lidocaine Other (See Comments)    REACTION: unknown REACTION: unknown   Metformin Other (See Comments)    REACTION: GI REACTION: GI   Other Other (See Comments)   Paroxetine Other (See Comments)    REACTION: doesn't agree REACTION: doesn't agree   Propoxyphene Other (See Comments)    wheezing wheezing   Tramadol Other (See Comments)    REACTION: Causes Anxiety   Tramadol Hcl     REACTION: Causes Anxiety   Wellbutrin [Bupropion] Other (See Comments)    Pt is unsure   Codeine Nausea And Vomiting and Rash   Sulfa Antibiotics Rash and Other (See Comments)     The results of significant diagnostics from this hospitalization (including imaging, microbiology, ancillary and laboratory) are listed below for reference.   Consultations:   Procedures/Studies: NM Myocar Multi W/Spect W/Wall Motion / EF  Result Date:  04/13/2023   The study is normal. The study is low risk.   No ST deviation was noted.   LV perfusion is normal. There is no evidence of ischemia. There is no evidence of infarction.   Left ventricular function is normal. Nuclear stress EF: 60%. The left ventricular ejection fraction is normal (55-65%). End diastolic cavity size is normal. End systolic cavity size is normal. Normal myocardial perfusion scan No evidence of stress-induced myocardial ischemia Fraction of 60% with normal wall motion This is a low risk study   ECHOCARDIOGRAM COMPLETE  Result Date: 04/13/2023    ECHOCARDIOGRAM REPORT   Patient Name:   Alexa Woods Date of Exam: 04/13/2023 Medical Rec #:  [id number]      Height:       65.0 in Accession #:    [id number]     Weight:       250.0 lb Date of Birth:  [birth year]     BSA:          2.175 m Patient Age:    62 years       BP:           165/87 mmHg Patient Gender: F              HR:           56 bpm. Exam Location:  [facility] Procedure: 2D Echo, Cardiac Doppler and Color Doppler Indications:     Chest Pain  History:         Patient has no prior history of Echocardiogram examinations.                  Signs/Symptoms:Chest Pain and Edema; Risk Factors:Hypertension,                  Diabetes and Dyslipidemia.  Sonographer:     Mikki Harbor Referring Phys:  [id number] Eliezer Mccoy PATEL Diagnosing Phys: Yvonne Kendall MD  Sonographer Comments: Suboptimal subcostal window and patient is obese. Image acquisition challenging due to respiratory motion. IMPRESSIONS  1. Left ventricular ejection fraction, by estimation, is 60 to 65%. The left ventricle has normal function. The left ventricle has no regional wall motion abnormalities. Left ventricular diastolic parameters were normal.  2. Right ventricular systolic  function is normal. The right ventricular size is normal. There is normal pulmonary artery systolic pressure.  3. The mitral valve is normal in structure. Trivial mitral valve regurgitation. No evidence  of mitral stenosis.  4. The aortic valve is tricuspid. Aortic valve regurgitation is trivial. No aortic stenosis is present. FINDINGS  Left Ventricle: Left ventricular ejection fraction, by estimation, is 60 to 65%. The left ventricle has normal function. The left ventricle has no regional wall motion abnormalities. The left ventricular internal cavity size was normal in size. There is  no left ventricular hypertrophy. Left ventricular diastolic parameters were normal. Right Ventricle: The right ventricular size is normal. No increase in right ventricular wall thickness. Right ventricular systolic function is normal. There is normal pulmonary artery systolic pressure. Left Atrium: Left atrial size was normal in size. Right Atrium: Right atrial size was normal in size. Pericardium: There is no evidence of pericardial effusion. Mitral Valve: The mitral valve is normal in structure. There is mild thickening of the mitral valve leaflet(s). Trivial mitral valve regurgitation. No evidence of mitral valve stenosis. MV peak gradient, 3.6 mmHg. The mean mitral valve gradient is 1.0 mmHg. Tricuspid Valve: The tricuspid valve is normal in structure. Tricuspid valve regurgitation is mild. Aortic Valve: The aortic valve is tricuspid. Aortic valve regurgitation is trivial. No aortic stenosis is present. Aortic valve mean gradient measures 4.0 mmHg. Aortic valve peak gradient measures 8.0 mmHg. Aortic valve area, by VTI measures 2.98 cm. Pulmonic Valve: The pulmonic valve was normal in structure. Pulmonic valve regurgitation is trivial. No evidence of pulmonic stenosis. Aorta: The aortic root is normal in size and structure. Pulmonary Artery: The pulmonary artery is of normal size. Venous: The inferior vena cava was not well visualized. IAS/Shunts: No atrial level shunt detected by color flow Doppler.  LEFT VENTRICLE PLAX 2D LVIDd:         4.50 cm   Diastology LVIDs:         2.80 cm   LV e' medial:    7.29 cm/s LV PW:          0.90 cm   LV E/e' medial:  14.5 LV IVS:        0.93 cm   LV e' lateral:   11.20 cm/s LVOT diam:     2.10 cm   LV E/e' lateral: 9.5 LV SV:         106 LV SV Index:   49 LVOT Area:     3.46 cm  RIGHT VENTRICLE RV Basal diam:  4.00 cm RV Mid diam:    3.70 cm RV S prime:     12.60 cm/s TAPSE (M-mode): 2.5 cm LEFT ATRIUM             Index        RIGHT ATRIUM           Index LA diam:        4.00 cm 1.84 cm/m   RA Area:     12.10 cm LA Vol (A2C):   57.7 ml 26.53 ml/m  RA Volume:   24.00 ml  11.03 ml/m LA Vol (A4C):   58.1 ml 26.71 ml/m LA Biplane Vol: 59.8 ml 27.50 ml/m  AORTIC VALVE                    PULMONIC VALVE AV Area (Vmax):    2.80 cm     PV Vmax:       1.10 m/s AV Area (Vmean):  2.98 cm     PV Peak grad:  4.8 mmHg AV Area (VTI):     2.98 cm AV Vmax:           141.00 cm/s AV Vmean:          86.900 cm/s AV VTI:            0.354 m AV Peak Grad:      8.0 mmHg AV Mean Grad:      4.0 mmHg LVOT Vmax:         114.00 cm/s LVOT Vmean:        74.800 cm/s LVOT VTI:          0.305 m LVOT/AV VTI ratio: 0.86  AORTA Ao Root diam: 3.20 cm MITRAL VALVE                TRICUSPID VALVE MV Area (PHT): 3.40 cm     TR Peak grad:   12.8 mmHg MV Area VTI:   2.99 cm     TR Vmax:        179.00 cm/s MV Peak grad:  3.6 mmHg MV Mean grad:  1.0 mmHg     SHUNTS MV Vmax:       0.95 m/s     Systemic VTI:  0.30 m MV Vmean:      53.5 cm/s    Systemic Diam: 2.10 cm MV Decel Time: 223 msec MV E velocity: 106.00 cm/s MV A velocity: 72.30 cm/s MV E/A ratio:  1.47 Yvonne Kendall MD Electronically signed by Yvonne Kendall MD Signature Date/Time: 04/13/2023/4:21:22 PM    Final    CT Angio Chest Pulmonary Embolism (PE) W or WO Contrast  Result Date: 04/13/2023 CLINICAL DATA:  62 year old female with history of chest pain and shortness of breath. EXAM: CT ANGIOGRAPHY CHEST WITH CONTRAST TECHNIQUE: Multidetector CT imaging of the chest was performed using the standard protocol during bolus administration of intravenous contrast.  Multiplanar CT image reconstructions and MIPs were obtained to evaluate the vascular anatomy. RADIATION DOSE REDUCTION: This exam was performed according to the departmental dose-optimization program which includes automated exposure control, adjustment of the mA and/or kV according to patient size and/or use of iterative reconstruction technique. CONTRAST:  75mL OMNIPAQUE IOHEXOL 350 MG/ML SOLN COMPARISON:  Chest CTA 09/03/2014. FINDINGS: Cardiovascular: There are no filling defects within the pulmonary arterial tree to suggest pulmonary embolism. Heart size is normal. There is no significant pericardial fluid, thickening or pericardial calcification. There is aortic atherosclerosis, as well as atherosclerosis of the great vessels of the mediastinum and the coronary arteries, including calcified atherosclerotic plaque in the left anterior descending and left circumflex coronary arteries. Mediastinum/Nodes: No pathologically enlarged mediastinal or hilar lymph nodes. Hilar esophagus is unremarkable in appearance. No axillary lymphadenopathy. Lungs/Pleura: Elevation of the right hemidiaphragm. Areas of mild linear scarring/atelectasis are noted in the right middle lobe and right lower lobe. No acute consolidative airspace disease. No pleural effusions. No definite suspicious appearing pulmonary nodules or masses are noted. Upper Abdomen: Status post cholecystectomy. Musculoskeletal: There are no aggressive appearing lytic or blastic lesions noted in the visualized portions of the skeleton. Review of the MIP images confirms the above findings. IMPRESSION: 1. No evidence of pulmonary embolism. 2. Elevation of the right hemidiaphragm with areas of atelectasis and/or scarring in the right lung base. 3. Aortic atherosclerosis, in addition to two-vessel coronary artery disease. Please note that although the presence of coronary artery calcium documents the presence of coronary artery disease, the severity of  this disease  and any potential stenosis cannot be assessed on this non-gated CT examination. Assessment for potential risk factor modification, dietary therapy or pharmacologic therapy may be warranted, if clinically indicated. Aortic Atherosclerosis (ICD10-I70.0). Electronically Signed   By: Trudie Reed M.D.   On: 04/13/2023 06:22   DG Chest 2 View  Result Date: 04/12/2023 CLINICAL DATA:  Shortness of breath EXAM: CHEST - 2 VIEW COMPARISON:  Chest x-ray 04/09/2023 FINDINGS: There is stable mild elevation of the right hemidiaphragm with atelectasis in the right lung base. The lungs are otherwise clear. There is no pleural effusion or pneumothorax. Cardiomediastinal silhouette is within normal limits. IMPRESSION: Stable mild elevation of the right hemidiaphragm with atelectasis in the right lung base. Electronically Signed   By: Darliss Cheney M.D.   On: 04/12/2023 20:32   DG Chest 2 View  Result Date: 04/09/2023 CLINICAL DATA:  Chest pain. EXAM: CHEST - 2 VIEW COMPARISON:  August 14, 2022 FINDINGS: The heart size and mediastinal contours are within normal limits. There is moderate severity elevation of the right hemidiaphragm. This represents a new finding when compared to the prior study. Mild linear atelectasis is noted within the right lung base. No acute infiltrate, pleural effusion or pneumothorax is identified. The visualized skeletal structures are unremarkable. IMPRESSION: Moderate severity elevation of the right hemidiaphragm with mild right basilar linear atelectasis. Electronically Signed   By: Aram Candela M.D.   On: 04/09/2023 21:22   DG Shoulder Right  Result Date: 03/16/2023 CLINICAL DATA:  Right shoulder pain after fall 4 days ago. EXAM: RIGHT SHOULDER - 2+ VIEW COMPARISON:  None Available. FINDINGS: There is no evidence of fracture or dislocation. Mild degenerative changes seen involving the acromioclavicular joint. Soft tissues are unremarkable. 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 No acute abnormality seen. Electronically Signed   By: Lupita Raider M.D.   On: 03/16/2023 18:42      Labs: BNP (last 3 results) Recent Labs    04/13/23 0139  BNP 120.2*   Basic Metabolic Panel: Recent Labs  Lab 04/09/23 1740 04/12/23 1704 04/13/23 0139  NA 137 141  --   K 3.0* 3.0*  --   CL 101 105  --   CO2 26 28  --   GLUCOSE 130* 55*  --   BUN 16 21*  --   CREATININE 1.02* 0.98  --   CALCIUM 9.0 9.1  --   MG  --   --  2.3   Liver Function Tests: Recent Labs  Lab 04/09/23 1740 04/12/23 1704  AST 21 20  ALT 21 21  ALKPHOS 108 91  BILITOT 0.3 0.3  PROT 7.6 7.3  ALBUMIN 3.9 3.6   No results for input(s): "LIPASE", "AMYLASE" in the last 168 hours. No results for input(s): "AMMONIA" in the last 168 hours. CBC: Recent Labs  Lab 04/09/23 1740 04/12/23 1704  WBC 7.8 10.4  HGB 13.5 12.7  HCT 41.3 38.5  MCV 85.9 83.3  PLT 278 310   Cardiac Enzymes: No results for input(s): "CKTOTAL", "CKMB", "CKMBINDEX", "TROPONINI" in the last 168 hours. BNP: Invalid input(s): "POCBNP" CBG: Recent Labs  Lab 04/12/23 2137 04/13/23 0249 04/13/23 0457 04/13/23 0715 04/13/23 1549  GLUCAP 88 290* 220* 165* 200*   D-Dimer Recent Labs    04/13/23 0139  DDIMER 0.34   Hgb A1c No results for input(s): "HGBA1C" in the last 72 hours. Lipid Profile Recent Labs    04/13/23 0139  CHOL 180  HDL 95  LDLCALC 71  TRIG 71  CHOLHDL 1.9   Thyroid function studies Recent Labs    04/13/23 0139  TSH 0.556   Anemia work up No results for input(s): "VITAMINB12", "FOLATE", "FERRITIN", "TIBC", "IRON", "RETICCTPCT" in the last 72 hours. Urinalysis    Component Value Date/Time   COLORURINE YELLOW 06/05/2021 1938   APPEARANCEUR CLEAR 06/05/2021 1938   APPEARANCEUR Clear 05/26/2013 1843   LABSPEC 1.015 06/05/2021 1938   LABSPEC 1.018 05/26/2013 1843   PHURINE 5.0 06/05/2021 1938   GLUCOSEU >=500 (A) 06/05/2021 1938   GLUCOSEU >=500  05/26/2013 1843   HGBUR SMALL (A) 06/05/2021 1938   HGBUR trace-intact 07/29/2009 1507   BILIRUBINUR NEGATIVE 06/05/2021 1938   BILIRUBINUR 1 mg/dL 16/03/9603 5409   BILIRUBINUR Negative 05/26/2013 1843   KETONESUR NEGATIVE 06/05/2021 1938   PROTEINUR NEGATIVE 06/05/2021 1938   UROBILINOGEN 0.2 01/22/2021 1300   UROBILINOGEN 0.2 02/07/2011 2005   NITRITE NEGATIVE 06/05/2021 1938   LEUKOCYTESUR NEGATIVE 06/05/2021 1938   LEUKOCYTESUR Negative 05/26/2013 1843   Sepsis Labs Recent Labs  Lab 04/09/23 1740 04/12/23 1704  WBC 7.8 10.4   Microbiology Recent Results (from the past 240 hour(s))  Resp panel by RT-PCR (RSV, Flu A&B, Covid) Anterior Nasal Swab     Status: None   Collection Time: 04/09/23  6:24 PM   Specimen: Anterior Nasal Swab  Result Value Ref Range Status   SARS Coronavirus 2 by RT PCR NEGATIVE NEGATIVE Final    Comment: (NOTE) SARS-CoV-2 target nucleic acids are NOT DETECTED.  The SARS-CoV-2 RNA is generally detectable in upper respiratory specimens during the acute phase of infection. The lowest concentration of SARS-CoV-2 viral copies this assay can detect is 138 copies/mL. A negative result does not preclude SARS-Cov-2 infection and should not be used as the sole basis for treatment or other patient management decisions. A negative result may occur with  improper specimen collection/handling, submission of specimen other than nasopharyngeal swab, presence of viral mutation(s) within the areas targeted by this assay, and inadequate number of viral copies(<138 copies/mL). A negative result must be combined with clinical observations, patient history, and epidemiological information. The expected result is Negative.  Fact Sheet for Patients:  BloggerCourse.com  Fact Sheet for Healthcare Providers:  SeriousBroker.it  This test is no t yet approved or cleared by the [place] FDA and  has been authorized  for detection and/or diagnosis of SARS-CoV-2 by FDA under an Emergency Use Authorization (EUA). This EUA will remain  in effect (meaning this test can be used) for the duration of the COVID-19 declaration under Section 564(b)(1) of the Act, 21 U.S.C.section 360bbb-3(b)(1), unless the authorization is terminated  or revoked sooner.       Influenza A by PCR NEGATIVE NEGATIVE Final   Influenza B by PCR NEGATIVE NEGATIVE Final    Comment: (NOTE) The Xpert Xpress SARS-CoV-2/FLU/RSV plus assay is intended as an aid in the diagnosis of influenza from Nasopharyngeal swab specimens and should not be used as a sole basis for treatment. Nasal washings and aspirates are unacceptable for Xpert Xpress SARS-CoV-2/FLU/RSV testing.  Fact Sheet for Patients: BloggerCourse.com  Fact Sheet for Healthcare Providers: SeriousBroker.it  This test is not yet approved or cleared by the [place] FDA and has been authorized for detection and/or diagnosis of SARS-CoV-2 by FDA under an Emergency Use Authorization (EUA). This EUA will remain in effect (meaning this test can be used) for the duration of the COVID-19 declaration under Section 564(b)(1) of the Act,  21 U.S.C. section 360bbb-3(b)(1), unless the authorization is terminated or revoked.     Resp Syncytial Virus by PCR NEGATIVE NEGATIVE Final    Comment: (NOTE) Fact Sheet for Patients: BloggerCourse.com  Fact Sheet for Healthcare Providers: SeriousBroker.it  This test is not yet approved or cleared by the [place] FDA and has been authorized for detection and/or diagnosis of SARS-CoV-2 by FDA under an Emergency Use Authorization (EUA). This EUA will remain in effect (meaning this test can be used) for the duration of the COVID-19 declaration under Section 564(b)(1) of the Act, 21 U.S.C. section 360bbb-3(b)(1), unless the authorization is  terminated or revoked.  Performed at [facility], [address] [place], [place] [address]      Total time spend on discharging this patient, including the last patient exam, discussing the hospital stay, instructions for ongoing care as it relates to all pertinent caregivers, as well as preparing the medical discharge records, prescriptions, and/or referrals as applicable, is 45 minutes.    Darlin Priestly, MD  [facility] 04/13/2023, 7:21 PM

## 2023-04-13 NOTE — Progress Notes (Signed)
  PROGRESS NOTE    Alexa Woods  ZOX:[id number] DOB: 12-25-1960 DOA: 04/12/2023 PCP: Judy Pimple, MD  [id number]  LOS: 0 days   Brief hospital course:   Assessment & Plan: Alexa Woods is a 62 y.o. female with medical history significant for asthma, hypertension, hyperlipidemia, chronic pain on chronic opioids, anxiety on chronic benzo who presented to the emergency department with shortness of breath and chest pain.    Patient was seen on 04/09/23 in the emergency room for shortness of breath and was treated with steroids for asthma exacerbation.    * Chest pain --trop neg.  Echo normal.  Received nuclear stress test awaiting result.  Hx of Asthma Clinically stable no wheezing.  Not in acute exacerbation.  Hypothyroidism --cont synthroid  GERD without esophagitis --cont PPI  Chronic pain on chronic opioids --cont home Percocet PRN  Anxiety on chronic benzo --cont home ativan PRN  Essential hypertension --cont home HCTZ  Hypokalemia Monitor and supplement PRN  Type 2 diabetes mellitus with peripheral neuropathy (HCC) Glycemic protocol. Last A1c in 11/2022 is 7.4. --ACHS and SSI for now   DVT prophylaxis: Lovenox SQ Code Status: Full code  Family Communication:  Level of care: Progressive Dispo:   The patient is from: home Anticipated d/c is to: home Anticipated d/c date is: likely tomorrow Patient currently is not medically ready to d/c due to: pending stress test result   Subjective and Interval History:  No chest pain.  No dyspnea at rest.   Objective: Vitals:   04/13/23 0700 04/13/23 0730 04/13/23 0756 04/13/23 1355  BP: (!) 169/82 (!) 165/87  (!) 158/73  Pulse: 60 (!) 58  (!) 56  Resp:  16  18  Temp:   (!) 97.5 F (36.4 C) 98.5 F (36.9 C)  TempSrc:   Oral Oral  SpO2: 95% 96%  96%   No intake or output data in the 24 hours ending 04/13/23 1814 There were no vitals filed for this visit.  Examination:   Constitutional: NAD,  AAOx3 HEENT: conjunctivae and lids normal, EOMI CV: No cyanosis.   RESP: normal respiratory effort, lungs clear, no wheezing, on RA Neuro: II - XII grossly intact.     Data Reviewed: I have personally reviewed labs and imaging studies  No charge.  Darlin Priestly, MD [facility] If 7PM-7AM, please contact night-coverage 04/13/2023, 6:14 PM

## 2023-04-13 NOTE — Progress Notes (Signed)
*  PRELIMINARY RESULTS* Echocardiogram 2D Echocardiogram has been performed.  Carolyne Fiscal 04/13/2023, 10:28 AM

## 2023-04-13 NOTE — Assessment & Plan Note (Signed)
Glycemic protocol. Last A1c in 11/2022 is 7.4.

## 2023-04-13 NOTE — Assessment & Plan Note (Signed)
Ethanol level pending.

## 2023-04-13 NOTE — H&P (Signed)
History and Physical    [name] GUY:[id number] DOB: [birth year] DOA: 04/12/2023 DOS: the patient was seen and examined on 04/13/2023 PCP: Judy Pimple, MD  Patient coming from: Home  Chief Complaint:  Chief Complaint  Patient presents with   Shortness of Breath   HPI: Alexa Woods is a 62 y.o. female with medical history significant for asthma, obesity, allergies to 14 medications, hypertension, hyperlipidemia, presents to the emergency department with shortness of breath and chest pain.  Patient was seen in Thursday in the emergency room for shortness of breath and was treated with steroids for asthma exacerbation.  Chest pain has worsened over the past few days and now feels tight and notices it more with ambulation. Patient is a limited historian, does report left-sided chest pain under her breast tender to palpation.  In emergency room vitals trend shows: Vitals:   04/12/23 1704 04/12/23 2252 04/12/23 2254 04/13/23 0039  BP: 112/61   (!) 159/68  Pulse: (!) 51 96 (!) 109 94  Temp: 97.9 F (36.6 C)     Resp: 18   12  SpO2: 94% 97% 97% 100%  EKG today shows Sinus bradycardia at 51 PR of 210, QTc 396 significant Q waves in V1 concerning for infarct.  Troponin of 7.  Labs are notable for : Metabolic panel showing hypokalemia of 3.0 normal kidney function glucose of 55. Normal CBC. Negative viral panel for flu/ rsv/ covid.  Chest x-ray shows mild elevation of the right hemidiaphragm with atelectasis in the right lung base otherwise negative. Chart review shows MRI in 2021: Ventricular prominence which has noticeably progressed as compared to head CT 05/22/2009 and is suspicious for communicating hydrocephalus- will defer to am team to follow up if this was managed as this was found later. No neuro symptoms on exam with me.  Moderate to high risk on heart score . Wells for pe is about 4.5 putting her at moderate risk of PE.  In the ED pt received: Medications   nitroGLYCERIN (NITROSTAT) SL tablet 0.4 mg (has no administration in time range)  fluticasone (FLONASE) 50 MCG/ACT nasal spray 2 spray (has no administration in time range)  levothyroxine (SYNTHROID) tablet 50 mcg (has no administration in time range)  pantoprazole (PROTONIX) EC tablet 40 mg (has no administration in time range)  sertraline (ZOLOFT) tablet 100 mg (has no administration in time range)  acetaminophen (TYLENOL) tablet 650 mg (has no administration in time range)  ondansetron (ZOFRAN) injection 4 mg (has no administration in time range)  heparin injection 5,000 Units (has no administration in time range)  hydrALAZINE (APRESOLINE) injection 10 mg (has no administration in time range)  albuterol (PROVENTIL) (2.5 MG/3ML) 0.083% nebulizer solution 2.5 mg (has no administration in time range)  hydrochlorothiazide (HYDRODIURIL) tablet 12.5 mg (has no administration in time range)  aspirin EC tablet 81 mg (has no administration in time range)  insulin aspart (novoLOG) injection 0-9 Units (has no administration in time range)  ipratropium-albuterol (DUONEB) 0.5-2.5 (3) MG/3ML nebulizer solution 6 mL (6 mLs Nebulization Given 04/12/23 2202)  famotidine (PEPCID) tablet 20 mg (20 mg Oral Given 04/12/23 2226)  aspirin chewable tablet 324 mg (324 mg Oral Given 04/13/23 0035)  iohexol (OMNIPAQUE) 350 MG/ML injection 75 mL (75 mLs Intravenous Contrast Given 04/13/23 0209)   Review of Systems  Respiratory:  Positive for shortness of breath.   Cardiovascular:  Positive for chest pain.  Gastrointestinal:  Positive for nausea.   Past Medical History:  Diagnosis  Date   Alcohol abuse, in remission    since 1998   Allergic rhinitis    Anemia    Anxiety    Asthma    Bilateral lower extremity edema    Bulging lumbar disc    Bulging of cervical intervertebral disc    Chronic constipation    DDD (degenerative disc disease), lumbosacral    Depression    Dyspnea    Gastroparesis    GERD  (gastroesophageal reflux disease)    Hemorrhoids    History of Bell's palsy 08/2007   left   History of chronic cystitis    IBS (irritable bowel syndrome)    Insulin dependent type 2 diabetes mellitus [facility])    endocrinologist-- dr Elvera Lennox   Lower urinary tract symptoms (LUTS)    OA (osteoarthritis)    knees, back, hands, elbows   OSA (obstructive sleep apnea)    per study 06-08-2017 mild osa , cpap recommended , per pt insurance issue   Peripheral neuropathy    PONV (postoperative nausea and vomiting)    Psoriasis    S/P dilatation of esophageal stricture    Unspecified essential hypertension    Wears partial dentures    lower   Past Surgical History:  Procedure Laterality Date   CHOLECYSTECTOMY OPEN  1990   AND APPENDECTOMY   DOBUTAMINE STRESS ECHO  2009    normal stress echo, no evidence of ischemia   ELBOW SURGERY Bilateral RIGHT 2013;  LEFT 2016   for nerve damage   ESOPHAGOGASTRODUODENOSCOPY  07/2002   erythematous gastropathy   eye sugery Bilateral    KNEE ARTHROSCOPY Left 11/ 2018   dr Lequita Halt  @ [facility]   KNEE ARTHROSCOPY W/ PARTIAL MEDIAL MENISCECTOMY Right 10/2008   and chondroplasty   SHOULDER ARTHROSCOPY WITH DISTAL CLAVICLE RESECTION Left 12/ 2011   dr dean   TOTAL KNEE ARTHROPLASTY Right 07-01-2009  dr Lequita Halt   [facility]   TOTAL KNEE ARTHROPLASTY Left 12/06/2017   Procedure: LEFT TOTAL LEFT KNEE ARTHROPLASTY;  Surgeon: Ollen Gross, MD;  Location: [facility];  Service: Orthopedics;  Laterality: Left;   Trigger finger surgery Left 07/2022   VAGINAL HYSTERECTOMY  2003    reports that she quit smoking about 21 years ago. Her smoking use included cigarettes. She started smoking about 46 years ago. She has never used smokeless tobacco. She reports that she does not drink alcohol and does not use drugs.  Allergies  Allergen Reactions   Bupropion Hcl Other (See Comments)    Pt is unsure   Cefuroxime Axetil Nausea Only   Crestor [Rosuvastatin Calcium]     Pt states it  makes her feel weird    Gabapentin Other (See Comments)    Pt does not remember reaction  Pt does not remember reaction    Lidocaine Other (See Comments)    REACTION: unknown REACTION: unknown   Metformin Other (See Comments)    REACTION: GI REACTION: GI   Other Other (See Comments)   Paroxetine Other (See Comments)    REACTION: doesn't agree REACTION: doesn't agree   Propoxyphene Other (See Comments)    wheezing wheezing   Tramadol Other (See Comments)    REACTION: Causes Anxiety   Tramadol Hcl     REACTION: Causes Anxiety   Wellbutrin [Bupropion] Other (See Comments)    Pt is unsure   Codeine Nausea And Vomiting and Rash   Sulfa Antibiotics Rash and Other (See Comments)    Family History  Problem Relation Age of Onset  Alcohol abuse Mother    Hypertension Mother    Diabetes Mother    Heart failure Mother    Diabetes Sister    Heart attack Sister    Diabetes Sister    Diabetes Sister    Diabetes Sister    Celiac disease Daughter    Esophageal cancer Maternal Grandmother    Diabetes Brother    Parkinson's disease Brother    Heart attack Brother    Heart attack Brother 33   Diabetes Brother    Diabetes Brother    Diabetes Brother    Thyroid disease Niece    Lung cancer Other        mat great uncle   Colon cancer Neg Hx    Breast cancer Neg Hx     Prior to Admission medications   Medication Sig Start Date End Date Taking? Authorizing Provider  albuterol (VENTOLIN HFA) 108 (90 Base) MCG/ACT inhaler Inhale 2 puffs into the lungs every 4 (four) hours as needed for wheezing or shortness of breath. 03/24/23   Tower, Audrie Gallus, MD  B-D UF III MINI PEN NEEDLES 31G X 5 MM MISC USE AS INSTRUCTED FOR 4X DAILY INJECTIONS 12/16/22   Carlus Pavlov, MD  BD INSULIN SYRINGE U/F 31G X 5/16" 0.5 ML MISC USE THREE TIMES PER DAY WITH R INSULIN & ONCE DAILY WITH LEVEMIR 09/07/21   Carlus Pavlov, MD  cholecalciferol (VITAMIN D3) 25 MCG (1000 UNIT) tablet Take 2,000 Units by  mouth daily.    [provider]  Continuous Blood Gluc Receiver (DEXCOM G6 RECEIVER) DEVI Use as directed to check blood sugar. 11/07/21   Carlus Pavlov, MD  Continuous Blood Gluc Sensor (DEXCOM G6 SENSOR) MISC Use as directed to check blood sugar. change every 10 days 11/07/21   Carlus Pavlov, MD  dapagliflozin propanediol (FARXIGA) 10 MG TABS tablet Take 1 tablet (10 mg total) by mouth daily before breakfast. 04/09/23   Carlus Pavlov, MD  docusate sodium (COLACE) 100 MG capsule Take 100 mg by mouth every morning.    [provider]  ezetimibe (ZETIA) 10 MG tablet Take 1 tablet (10 mg total) by mouth daily. 11/17/22   Tower, Audrie Gallus, MD  fluticasone (FLONASE) 50 MCG/ACT nasal spray PLACE 2 SPRAYS INTO BOTH NOSTRILS DAILY AS NEEDED FOR ALLERGIES OR RHINITIS. 03/15/23   Tower, Marne A, MD  glucose blood (ACCU-CHEK GUIDE) test strip USE TO TEST BLOOD SUGAR 4 TIMES DAILY AS INSTRUCTED. 09/23/22   Carlus Pavlov, MD  hydrochlorothiazide (HYDRODIURIL) 25 MG tablet TAKE 1 TABLET EVERY DAY 04/30/22   Tower, [place] A, MD  insulin glargine (LANTUS SOLOSTAR) 100 UNIT/ML Solostar Pen INJECT 30-35 UNITS DAILY 02/03/23   Carlus Pavlov, MD  Insulin Pen Needle 32G X 4 MM MISC Use 4x a day 09/19/21   Carlus Pavlov, MD  Insulin Regular Human (NOVOLIN R FLEXPEN RELION) 100 UNIT/ML KwikPen Inject 18-20 units under skin before each meal 03/16/23   Carlus Pavlov, MD  KLOR-CON M10 10 MEQ tablet TAKE 1 TABLET BY MOUTH EVERY DAY 01/18/23   Tower, Audrie Gallus, MD  Lancets [facility] ULTRASOFT) lancets Use to test blood sugar 4 times daily as instructed. 10/04/18   Carlus Pavlov, MD  levothyroxine (SYNTHROID) 50 MCG tablet Take 1 tablet (50 mcg total) by mouth daily before breakfast. 02/22/23   Tower, Audrie Gallus, MD  LORazepam (ATIVAN) 2 MG tablet Take 2 mg by mouth 2 (two) times daily. 05/26/21   [provider]  naloxegol oxalate (MOVANTIK) 25 MG TABS  tablet Take 1 tablet (25 mg total) by  mouth daily. 11/13/21   Doree Albee, PA-C  naloxone [facility]) 4 MG/0.1ML LIQD nasal spray kit     [provider]  oxyCODONE-acetaminophen (PERCOCET) 10-325 MG tablet Take 1 tablet by mouth every 4 (four) hours as needed for pain. Hold for SBP < = 105 12/15/17   Medina-Vargas, Monina C, NP  pantoprazole (PROTONIX) 40 MG tablet TAKE 1 TABLET TWICE DAILY 04/30/22   Tower, Audrie Gallus, MD  predniSONE (DELTASONE) 50 MG tablet Take 1 tablet (50 mg total) by mouth daily. 04/09/23   Glyn Ade, MD  sertraline (ZOLOFT) 100 MG tablet Take 200 mg by mouth every morning.    [provider]  tiZANidine (ZANAFLEX) 4 MG tablet Take 4 mg by mouth 2 (two) times daily as needed for muscle spasms.  07/10/14   [provider]  triamcinolone cream (KENALOG) 0.1 % Apply 1 application topically 2 (two) times daily. To psoriasis areas 08/22/18   Tower, Audrie Gallus, MD  zolpidem (AMBIEN) 10 MG tablet Take 10 mg by mouth at bedtime as needed for sleep.    [provider]     Vitals:   04/12/23 1704 04/12/23 2252 04/12/23 2254 04/13/23 0039  BP: 112/61   (!) 159/68  Pulse: (!) 51 96 (!) 109 94  Resp: 18   12  Temp: 97.9 F (36.6 C)     SpO2: 94% 97% 97% 100%   Physical Exam Vitals and nursing note reviewed.  Constitutional:      General: She is not in acute distress.    Appearance: She is obese.  HENT:     Head: Normocephalic and atraumatic.     Right Ear: Hearing normal.     Left Ear: Hearing normal.     Nose: Nose normal. No nasal deformity.     Mouth/Throat:     Lips: Pink.     Tongue: No lesions.     Pharynx: Oropharynx is clear.  Eyes:     General: Lids are normal.     Extraocular Movements: Extraocular movements intact.  Cardiovascular:     Rate and Rhythm: Normal rate and regular rhythm.     Heart sounds: Normal heart sounds.  Pulmonary:     Effort: Pulmonary effort is normal.     Breath sounds: Normal breath sounds.  Chest:     Chest wall: Tenderness  present. No mass, lacerations, deformity or swelling.    Abdominal:     General: Bowel sounds are normal. There is no distension.     Palpations: Abdomen is soft. There is no mass.     Tenderness: There is no abdominal tenderness.  Musculoskeletal:     Right lower leg: No edema.     Left lower leg: No edema.  Skin:    General: Skin is warm.  Neurological:     General: No focal deficit present.     Mental Status: She is alert and oriented to person, place, and time.     Cranial Nerves: Cranial nerves 2-12 are intact.  Psychiatric:        Attention and Perception: Attention normal.        Mood and Affect: Mood normal.        Speech: Speech normal.        Behavior: Behavior normal. Behavior is cooperative.    Labs on Admission: I have personally reviewed following labs and imaging studies Results for orders placed or performed during the hospital encounter  of 04/12/23 (from the past 24 hour(s))  Basic metabolic panel     Status: Abnormal   Collection Time: 04/12/23  5:04 PM  Result Value Ref Range   Sodium 141 135 - 145 mmol/L   Potassium 3.0 (L) 3.5 - 5.1 mmol/L   Chloride 105 98 - 111 mmol/L   CO2 28 22 - 32 mmol/L   Glucose, Bld 55 (L) 70 - 99 mg/dL   BUN 21 (H) 6 - 20 mg/dL   Creatinine, Ser 7.84 0.44 - 1.00 mg/dL   Calcium 9.1 8.9 - 69.6 mg/dL   GFR, Estimated >29 >52 mL/min   Anion gap 8 5 - 15  CBC     Status: None   Collection Time: 04/12/23  5:04 PM  Result Value Ref Range   WBC 10.4 4.0 - 10.5 K/uL   RBC 4.62 3.87 - 5.11 MIL/uL   Hemoglobin 12.7 12.0 - 15.0 g/dL   HCT 84.1 32.4 - 40.1 %   MCV 83.3 80.0 - 100.0 fL   MCH 27.5 26.0 - 34.0 pg   MCHC 33.0 30.0 - 36.0 g/dL   RDW 02.7 25.3 - 66.4 %   Platelets 310 150 - 400 K/uL   nRBC 0.0 0.0 - 0.2 %  Hepatic function panel     Status: None   Collection Time: 04/12/23  5:04 PM  Result Value Ref Range   Total Protein 7.3 6.5 - 8.1 g/dL   Albumin 3.6 3.5 - 5.0 g/dL   AST 20 15 - 41 U/L   ALT 21 0 - 44 U/L    Alkaline Phosphatase 91 38 - 126 U/L   Total Bilirubin 0.3 <1.2 mg/dL   Bilirubin, Direct <4.0 0.0 - 0.2 mg/dL   Indirect Bilirubin NOT CALCULATED 0.3 - 0.9 mg/dL  Troponin I (High Sensitivity)     Status: None   Collection Time: 04/12/23  5:06 PM  Result Value Ref Range   Troponin I (High Sensitivity) 7 <18 ng/L  CBG monitoring, ED     Status: None   Collection Time: 04/12/23  9:37 PM  Result Value Ref Range   Glucose-Capillary 88 70 - 99 mg/dL   CBC: Recent Labs  Lab 04/09/23 1740 04/12/23 1704  WBC 7.8 10.4  HGB 13.5 12.7  HCT 41.3 38.5  MCV 85.9 83.3  PLT 278 310   Basic Metabolic Panel: Recent Labs  Lab 04/09/23 1740 04/12/23 1704  NA 137 141  K 3.0* 3.0*  CL 101 105  CO2 26 28  GLUCOSE 130* 55*  BUN 16 21*  CREATININE 1.02* 0.98  CALCIUM 9.0 9.1   GFR: Estimated Creatinine Clearance: 76.7 mL/min (by C-G formula based on SCr of 0.98 mg/dL). Liver Function Tests: Recent Labs  Lab 04/09/23 1740 04/12/23 1704  AST 21 20  ALT 21 21  ALKPHOS 108 91  BILITOT 0.3 0.3  PROT 7.6 7.3  ALBUMIN 3.9 3.6   No results for input(s): "LIPASE", "AMYLASE" in the last 168 hours. No results for input(s): "AMMONIA" in the last 168 hours. Coagulation Profile: No results for input(s): "INR", "PROTIME" in the last 168 hours. Cardiac Enzymes: No results for input(s): "CKTOTAL", "CKMB", "CKMBINDEX", "TROPONINI" in the last 168 hours. BNP (last 3 results) No results for input(s): "PROBNP" in the last 8760 hours. HbA1C: No results for input(s): "HGBA1C" in the last 72 hours. CBG: Recent Labs  Lab 04/12/23 2137  GLUCAP 88   Lipid Profile: No results for input(s): "CHOL", "HDL", "LDLCALC", "TRIG", "CHOLHDL", "LDLDIRECT" in  the last 72 hours. Thyroid Function Tests: No results for input(s): "TSH", "T4TOTAL", "FREET4", "T3FREE", "THYROIDAB" in the last 72 hours. Anemia Panel: No results for input(s): "VITAMINB12", "FOLATE", "FERRITIN", "TIBC", "IRON", "RETICCTPCT" in the  last 72 hours. Urinalysis    Component Value Date/Time   COLORURINE YELLOW 06/05/2021 1938   APPEARANCEUR CLEAR 06/05/2021 1938   APPEARANCEUR Clear 05/26/2013 1843   LABSPEC 1.015 06/05/2021 1938   LABSPEC 1.018 05/26/2013 1843   PHURINE 5.0 06/05/2021 1938   GLUCOSEU >=500 (A) 06/05/2021 1938   GLUCOSEU >=500 05/26/2013 1843   HGBUR SMALL (A) 06/05/2021 1938   HGBUR trace-intact 07/29/2009 1507   BILIRUBINUR NEGATIVE 06/05/2021 1938   BILIRUBINUR 1 mg/dL 09/81/1914 7829   BILIRUBINUR Negative 05/26/2013 1843   KETONESUR NEGATIVE 06/05/2021 1938   PROTEINUR NEGATIVE 06/05/2021 1938   UROBILINOGEN 0.2 01/22/2021 1300   UROBILINOGEN 0.2 02/07/2011 2005   NITRITE NEGATIVE 06/05/2021 1938   LEUKOCYTESUR NEGATIVE 06/05/2021 1938   LEUKOCYTESUR Negative 05/26/2013 1843   Unresulted Labs (From admission, onward)     Start     Ordered   04/13/23 0200  T4, free  Once,   AD        04/13/23 0200   04/13/23 0200  Magnesium  Once,   AD        04/13/23 0200   04/13/23 0200  TSH  Once,   AD        04/13/23 0200   04/13/23 0136  Lipid panel  Add-on,   AD        04/13/23 0135   04/13/23 0131  Ethanol  Add-on,   AD        04/13/23 0130   04/13/23 0131  Brain natriuretic peptide  Once,   R        04/13/23 0130   04/13/23 0127  HIV Antibody (routine testing w rflx)  (HIV Antibody (Routine testing w reflex) panel)  Once,   R        04/13/23 0128   04/13/23 0123  D-dimer, quantitative  ONCE - STAT,   STAT        04/13/23 0122            Medications  nitroGLYCERIN (NITROSTAT) SL tablet 0.4 mg (has no administration in time range)  fluticasone (FLONASE) 50 MCG/ACT nasal spray 2 spray (has no administration in time range)  levothyroxine (SYNTHROID) tablet 50 mcg (has no administration in time range)  pantoprazole (PROTONIX) EC tablet 40 mg (has no administration in time range)  sertraline (ZOLOFT) tablet 100 mg (has no administration in time range)  acetaminophen (TYLENOL) tablet 650  mg (has no administration in time range)  ondansetron (ZOFRAN) injection 4 mg (has no administration in time range)  heparin injection 5,000 Units (has no administration in time range)  hydrALAZINE (APRESOLINE) injection 10 mg (has no administration in time range)  albuterol (PROVENTIL) (2.5 MG/3ML) 0.083% nebulizer solution 2.5 mg (has no administration in time range)  hydrochlorothiazide (HYDRODIURIL) tablet 12.5 mg (has no administration in time range)  aspirin EC tablet 81 mg (has no administration in time range)  insulin aspart (novoLOG) injection 0-9 Units (has no administration in time range)  ipratropium-albuterol (DUONEB) 0.5-2.5 (3) MG/3ML nebulizer solution 6 mL (6 mLs Nebulization Given 04/12/23 2202)  famotidine (PEPCID) tablet 20 mg (20 mg Oral Given 04/12/23 2226)  aspirin chewable tablet 324 mg (324 mg Oral Given 04/13/23 0035)  iohexol (OMNIPAQUE) 350 MG/ML injection 75 mL (75 mLs Intravenous Contrast Given 04/13/23 0209)  Radiological Exams on Admission: DG Chest 2 View  Result Date: 04/12/2023 CLINICAL DATA:  Shortness of breath EXAM: CHEST - 2 VIEW COMPARISON:  Chest x-ray 04/09/2023 FINDINGS: There is stable mild elevation of the right hemidiaphragm with atelectasis in the right lung base. The lungs are otherwise clear. There is no pleural effusion or pneumothorax. Cardiomediastinal silhouette is within normal limits. IMPRESSION: Stable mild elevation of the right hemidiaphragm with atelectasis in the right lung base. Electronically Signed   By: Darliss Cheney M.D.   On: 04/12/2023 20:32     Data Reviewed: Relevant notes from primary care and specialist visits, past discharge summaries as available in EHR, including Care Everywhere. Prior diagnostic testing as pertinent to current admission diagnoses Updated medications and problem lists for reconciliation ED course, including vitals, labs, imaging, treatment and response to treatment Triage notes, nursing and pharmacy  notes and ED provider's notes Notable results as noted in HPI  Assessment and Plan: * Chest pain D/D include cardiac related VTE,GERD variant asthma related..   Hiatal hernia, pericardial effusion, pneumonia. Will also order a stress test, Lexiscan for evaluation of ischemic heart disease. Pt is at moderate to high risk for either PE or Coronary ischemia.  We will therefore proceed with CTA chest and stress test.  Cardiology consult as needed per AM team. Will also continue with IV PPIs.,  Will also continue with as needed albuterol and MDIs. Continue with.  Nitroglycerin.  As needed morphine.  Supplemental oxygen.   Hypothyroidism Will check patient's TSH and free T4 lipid panel and continue levothyroxine at 50 mcg.  GERD without esophagitis Continue with PPI therapy and as needed Zofran.  Asthma Clinically stable no wheezing. As needed albuterol.  Pulse oximetry with vitals.  Essential hypertension Vitals:   04/12/23 1704 04/13/23 0039  BP: 112/61 (!) 159/68  Home regimen consists of hydrochlorothiazide 25 mg. We will continue the same but at 12.5 mg and bid and if bp drops then we will titrate dose.  Monitor blood pressure closely.   History of alcohol abuse Ethanol level pending.   Hypokalemia Replace/ follow level. Pharmacy consult.   Type 2 diabetes mellitus with peripheral neuropathy (HCC) Glycemic protocol. Last A1c in 11/2022 is 7.4.     DVT prophylaxis:  Heparin   Consults:  None   Advance Care Planning:    Code Status: Full Code   Family Communication:  None   Disposition Plan:  Home   Severity of Illness: The appropriate patient status for this patient is OBSERVATION. Observation status is judged to be reasonable and necessary in order to provide the required intensity of service to ensure the patient's safety. The patient's presenting symptoms, physical exam findings, and initial radiographic and laboratory data in the context of their  medical condition is felt to place them at decreased risk for further clinical deterioration. Furthermore, it is anticipated that the patient will be medically stable for discharge from the hospital within 2 midnights of admission.   Author: Gertha Calkin, MD 04/13/2023 2:26 AM  For on call review [email or web address].  Orders Placed This Encounter  Procedures   DG Chest 2 View    If patient pregnant, contact provider.    Standing Status:   Standing    Number of Occurrences:   1    Order Specific Question:   Reason for Exam (SYMPTOM  OR DIAGNOSIS REQUIRED)    Answer:   sob    Order Specific Question:   Radiology Contrast Protocol - do NOT  remove file path    Answer:   [email or web address]   CT Angio Chest Pulmonary Embolism (PE) W or WO Contrast    Standing Status:   Standing    Number of Occurrences:   1    Order Specific Question:   Does the patient have a contrast media/X-ray dye allergy?    Answer:   No    Order Specific Question:   If indicated for the ordered procedure, I authorize the administration of contrast media per Radiology protocol    Answer:   Yes   NM Myocar Multi W/Spect W/Wall Motion / EF    Standing Status:   Standing    Number of Occurrences:   1    Order Specific Question:   Study with    Answer:   Lexiscan    Order Specific Question:   Will [facility] be the location of this test?    Answer:   Yes    Order Specific Question:   Please indicate who you request to read the nuc med / echo results.    Answer:   [facility] Unassigned    Order Specific Question:   What is the patient's sedation requirement?    Answer:   No Sedation    Order Specific Question:   Does the patient have a pacemaker or implanted devices?    Answer:   No   Basic metabolic panel    Standing Status:   Standing    Number of Occurrences:   1   CBC    Standing Status:   Standing    Number of Occurrences:   1   D-dimer, quantitative     Standing Status:   Standing    Number of Occurrences:   1   Hepatic function panel    Standing Status:   Standing    Number of Occurrences:   1   HIV Antibody (routine testing w rflx)    Standing Status:   Standing    Number of Occurrences:   1   Ethanol    Standing Status:   Standing    Number of Occurrences:   1   Brain natriuretic peptide    Standing Status:   Standing    Number of Occurrences:   1   Lipid panel    Standing Status:   Standing    Number of Occurrences:   1   T4, free    Standing Status:   Standing    Number of Occurrences:   1   Magnesium    Standing Status:   Standing    Number of Occurrences:   1   TSH    Standing Status:   Standing    Number of Occurrences:   1   Diet NPO time specified    Standing Status:   Standing    Number of Occurrences:   1   Document Height and Actual Weight    Use scales to weigh patient, not stated or estimated weight.    Standing Status:   Standing    Number of Occurrences:   1   Bed rest with bathroom privileges    Standing Status:   Standing    Number of Occurrences:   1   Apply Angina, Rule Out Myocardial Infarction Care Plan    Standing Status:   Standing    Number of Occurrences:   1   Vital signs with O2 sat q4 hours x 24 hours, then q shift  Standing Status:   Standing    Number of Occurrences:   1   Cardiac Monitoring Continuous x 24 hours Indications for use: Other; other indications for use: chest pain    Standing Status:   Standing    Number of Occurrences:   1    Order Specific Question:   Indications for use:    Answer:   Other    Order Specific Question:   other indications for use:    Answer:   chest pain   RN may order Cardiology PRN Orders utilizing "Cardiology PRN medications" (through manage orders) for the following patient needs:    indigestion (Maalox), cough (Robitussin DM), constipation (MOM), diarrhea (Imodium), or minor skin irritation (Hydrocortisone Cream)    Standing Status:   Standing     Number of Occurrences:   [phone]   Apply Diabetes Mellitus Care Plan    Standing Status:   Standing    Number of Occurrences:   1   STAT CBG when hypoglycemia is suspected. If treated, recheck every 15 minutes after each treatment until CBG >/= 70 mg/dl    Standing Status:   Standing    Number of Occurrences:   1   Refer to Hypoglycemia Protocol Sidebar Report for treatment of CBG < 70 mg/dl    Standing Status:   Standing    Number of Occurrences:   1   No HS correction Insulin    Standing Status:   Standing    Number of Occurrences:   1   Full code    Standing Status:   Standing    Number of Occurrences:   1    Order Specific Question:   By:    Answer:   Other   Consult to hospitalist    Standing Status:   Standing    Number of Occurrences:   1    Order Specific Question:   Place call to:    Answer:   [id number]    Order Specific Question:   Reason for Consult    Answer:   Admit   CBG monitoring, ED    Standing Status:   Standing    Number of Occurrences:   1   ED EKG    Standing Status:   Standing    Number of Occurrences:   1    Order Specific Question:   Reason for Exam    Answer:   Shortness of breath   ED EKG    Standing Status:   Standing    Number of Occurrences:   1    Order Specific Question:   Reason for Exam    Answer:   Chest Pain   EKG 12-Lead (at 6am)    Standing Status:   Standing    Number of Occurrences:   1    Order Specific Question:   Notes    Answer:   chest pain   EKG 12-Lead (recurrent chest pain)    Standing Status:   Standing    Number of Occurrences:   20    Order Specific Question:   Notes    Answer:   Recurrent Chest Pain   ECHOCARDIOGRAM COMPLETE    Standing Status:   Standing    Number of Occurrences:   1    Order Specific Question:   Perflutren DEFINITY (image enhancing agent) should be administered unless hypersensitivity or allergy exist    Answer:   Administer Perflutren    Order Specific Question:   Will  [facility] be the  location of this test?    Answer:   Yes    Order Specific Question:   Please indicate who you request to read the nuc med / echo results.    Answer:   [facility] Unassigned    Order Specific Question:   Reason for exam-Echo    Answer:   Chest Pain  R07.9   Place in observation (patient's expected length of stay will be less than 2 midnights)    Standing Status:   Standing    Number of Occurrences:   1    Order Specific Question:   Hospital Area    Answer:   [facility] [[id number]]    Order Specific Question:   Level of Care    Answer:   Progressive [102]    Order Specific Question:   Admit to Progressive based on following criteria    Answer:   CARDIOVASCULAR & THORACIC of moderate stability with acute coronary syndrome symptoms/low risk myocardial infarction/hypertensive urgency/arrhythmias/heart failure potentially compromising stability and stable post cardiovascular intervention patients.    Order Specific Question:   Admit to Progressive based on following criteria    Answer:   RESPIRATORY PROBLEMS hypoxemic/hypercapnic respiratory failure that is responsive to NIPPV (BiPAP) or High Flow Nasal Cannula (6-80 lpm). Frequent assessment/intervention, no > Q2 hrs < Q4 hrs, to maintain oxygenation and pulmonary hygiene.    Order Specific Question:   Covid Evaluation    Answer:   Asymptomatic - no recent exposure (last 10 days) testing not required    Order Specific Question:   Diagnosis    Answer:   Chest pain [[id number]]    Order Specific Question:   Admitting Physician    Answer:   Darrold Junker    Order Specific Question:   Attending Physician    Answer:   Darrold Junker

## 2023-04-14 ENCOUNTER — Telehealth: Payer: Self-pay

## 2023-04-14 NOTE — Transitions of Care (Post Inpatient/ED Visit) (Signed)
   04/14/2023  Name: Alexa Woods MRN: [id number] DOB: [birth year]  Today's TOC FU Call Status: Today's TOC FU Call Status:: Unsuccessful Call (1st Attempt) Unsuccessful Call (1st Attempt) Date: 04/14/23  Attempted to reach the patient regarding the most recent Inpatient/ED visit.  Follow Up Plan: Additional outreach attempts will be made to reach the patient to complete the Transitions of Care (Post Inpatient/ED visit) call.    Lendy Dittrich, CMA  [facility] AW[facility] Team Direct Dial: [phone]

## 2023-04-15 ENCOUNTER — Telehealth: Payer: Self-pay | Admitting: Family Medicine

## 2023-04-15 NOTE — Telephone Encounter (Signed)
Pt notified as instructed and pt voiced understanding and said if she gets worse pt will go to ED.sending FYI to Dr Alphonsus Sias and Dr Milinda Antis.

## 2023-04-15 NOTE — Telephone Encounter (Signed)
FYI: This call has been transferred to Access Nurse. Once the result note has been entered staff can address the message at that time.  Patient called in with the following symptoms:  Red Word:chest pain, SOB upon exertion, patient was sill not feeling well from ER visit   Please advise at Mobile [phone] (mobile)  Message is routed to Provider Pool and [facility] Triage

## 2023-04-15 NOTE — Telephone Encounter (Signed)
Per access note pt declined going to ED but noted pt feels OK while resting,and not active. If pt gets worse pt will go ED. Pt already has HFU appt with Dr Milinda Antis on 04/21/23 at 3:30pm. Sending note to Dr Milinda Antis who is out of office, Dr Alphonsus Sias who is in office and [employer].

## 2023-04-15 NOTE — Telephone Encounter (Signed)
Noted As long as she knows to go to the ER if she gets worse

## 2023-04-15 NOTE — Transitions of Care (Post Inpatient/ED Visit) (Signed)
   04/15/2023  Name: VELA SERAFINO MRN: [id number] DOB: [birth year]  Today's TOC FU Call Status: Today's TOC FU Call Status:: Unsuccessful Call (2nd Attempt) Unsuccessful Call (1st Attempt) Date: 04/14/23 Unsuccessful Call (2nd Attempt) Date: 04/15/23  Attempted to reach the patient regarding the most recent Inpatient/ED visit.  Follow Up Plan: Additional outreach attempts will be made to reach the patient to complete the Transitions of Care (Post Inpatient/ED visit) call.    Menno Vanbergen, CMA  [facility] Team Direct Dial: [phone]

## 2023-04-16 NOTE — Telephone Encounter (Signed)
Aware / agree with that advisement

## 2023-04-17 NOTE — Discharge Summary (Incomplete)
 Physician Discharge Summary   Alexa Woods  female DOB: December 14, 1960  WJX:[id number]  PCP: Judy Pimple, MD  Admit date: 04/12/2023 Discharge date: ***  Admitted From: *** Disposition:  {disposition:18248} Home Health: {yes/no:20286} CODE STATUS: {Palliative Code status:23503}  Discharge Instructions     Diet Carb Modified   Complete by: As directed    Discharge instructions   Complete by: As directed    Your stress test is normal.  The ultrasound of your heart is normal. [facility] Course:  For full details, please see H&P, progress notes, consult notes and ancillary notes.  Briefly,  ***  Unless noted above, medications under "STOP" list are ones pt was not taking PTA.  Discharge Diagnoses:  Principal Problem:   Chest pain Active Problems:   Type 2 diabetes mellitus with peripheral neuropathy (HCC)   Hypokalemia   History of alcohol abuse   Essential hypertension   Asthma   GERD without esophagitis   Hypothyroidism     Discharge Instructions:  Allergies as of 04/13/2023       Reactions   Bupropion Hcl Other (See Comments)   Pt is unsure   Cefuroxime Axetil Nausea Only   Crestor [rosuvastatin Calcium]    Pt states it makes her feel weird   Gabapentin Other (See Comments)   Pt does not remember reaction  Pt does not remember reaction    Lidocaine Other (See Comments)   REACTION: unknown REACTION: unknown   Metformin Other (See Comments)   REACTION: GI REACTION: GI   Other Other (See Comments)   Paroxetine Other (See Comments)   REACTION: doesn't agree REACTION: doesn't agree   Propoxyphene Other (See Comments)   wheezing wheezing   Tramadol Other (See Comments)   REACTION: Causes Anxiety   Tramadol Hcl    REACTION: Causes Anxiety   Wellbutrin [bupropion] Other (See Comments)   Pt is unsure   Codeine Nausea And Vomiting, Rash   Sulfa Antibiotics Rash, Other (See Comments)        Medication List     STOP taking these  medications    naloxegol oxalate 25 MG Tabs tablet Commonly known as: Movantik   pantoprazole 40 MG tablet Commonly known as: PROTONIX   predniSONE 50 MG tablet Commonly known as: DELTASONE   triamcinolone cream 0.1 % Commonly known as: KENALOG       TAKE these medications    Accu-Chek Guide test strip Generic drug: glucose blood USE TO TEST BLOOD SUGAR 4 TIMES DAILY AS INSTRUCTED.   albuterol 108 (90 Base) MCG/ACT inhaler Commonly known as: VENTOLIN HFA Inhale 2 puffs into the lungs every 4 (four) hours as needed for wheezing or shortness of breath.   BD Insulin Syringe U/F 31G X 5/16" 0.5 ML Misc Generic drug: Insulin Syringe-Needle U-100 USE THREE TIMES PER DAY WITH R INSULIN & ONCE DAILY WITH LEVEMIR   cholecalciferol 25 MCG (1000 UNIT) tablet Commonly known as: VITAMIN D3 Take 2,000 Units by mouth daily.   dapagliflozin propanediol 10 MG Tabs tablet Commonly known as: Farxiga Take 1 tablet (10 mg total) by mouth daily before breakfast. What changed: how much to take   Dexcom G6 Receiver [facility] Use as directed to check blood sugar.   Dexcom G6 Sensor Misc Use as directed to check blood sugar. change every 10 days   docusate sodium 100 MG capsule Commonly known as: COLACE Take 100 mg by mouth every morning.   ezetimibe 10 MG  tablet Commonly known as: Zetia Take 1 tablet (10 mg total) by mouth daily.   fluticasone 50 MCG/ACT nasal spray Commonly known as: FLONASE PLACE 2 SPRAYS INTO BOTH NOSTRILS DAILY AS NEEDED FOR ALLERGIES OR RHINITIS.   hydrochlorothiazide 25 MG tablet Commonly known as: HYDRODIURIL TAKE 1 TABLET EVERY DAY   Insulin Pen Needle 32G X 4 MM Misc Use 4x a day   B-D UF III MINI PEN NEEDLES 31G X 5 MM Misc Generic drug: Insulin Pen Needle USE AS INSTRUCTED FOR 4X DAILY INJECTIONS   Klor-Con M10 10 MEQ tablet Generic drug: potassium chloride TAKE 1 TABLET BY MOUTH EVERY DAY   Lantus SoloStar 100 UNIT/ML Solostar Pen Generic drug:  insulin glargine INJECT 30-35 UNITS DAILY   levothyroxine 50 MCG tablet Commonly known as: SYNTHROID Take 1 tablet (50 mcg total) by mouth daily before breakfast.   LORazepam 2 MG tablet Commonly known as: ATIVAN Take 2 mg by mouth 2 (two) times daily.   Narcan 4 MG/0.1ML Liqd nasal spray kit Generic drug: naloxone   NovoLIN R FlexPen ReliOn 100 UNIT/ML FlexPen Generic drug: Insulin Regular Human Inject 18-20 units under skin before each meal What changed:  how much to take how to take this when to take this additional instructions   onetouch ultrasoft lancets Use to test blood sugar 4 times daily as instructed.   oxyCODONE-acetaminophen 10-325 MG tablet Commonly known as: PERCOCET Take 1 tablet by mouth every 4 (four) hours as needed for pain. Hold for SBP < = 105   Restasis 0.05 % ophthalmic emulsion Generic drug: cycloSPORINE Place 1 drop into both eyes 2 (two) times daily.   sertraline 100 MG tablet Commonly known as: ZOLOFT Take 200 mg by mouth every morning.   tiZANidine 4 MG tablet Commonly known as: ZANAFLEX Take 4 mg by mouth 2 (two) times daily as needed for muscle spasms.   zolpidem 10 MG tablet Commonly known as: AMBIEN Take 10 mg by mouth at bedtime as needed for sleep.         Follow-up Information     Tower, Audrie Gallus, MD Follow up in 1 week(s).   Specialties: Family Medicine, Radiology Contact information: [address] [place] [address] [phone]                 Allergies  Allergen Reactions   Bupropion Hcl Other (See Comments)    Pt is unsure   Cefuroxime Axetil Nausea Only   Crestor [Rosuvastatin Calcium]     Pt states it makes her feel weird    Gabapentin Other (See Comments)    Pt does not remember reaction  Pt does not remember reaction    Lidocaine Other (See Comments)    REACTION: unknown REACTION: unknown   Metformin Other (See Comments)    REACTION: GI REACTION: GI   Other Other (See Comments)    Paroxetine Other (See Comments)    REACTION: doesn't agree REACTION: doesn't agree   Propoxyphene Other (See Comments)    wheezing wheezing   Tramadol Other (See Comments)    REACTION: Causes Anxiety   Tramadol Hcl     REACTION: Causes Anxiety   Wellbutrin [Bupropion] Other (See Comments)    Pt is unsure   Codeine Nausea And Vomiting and Rash   Sulfa Antibiotics Rash and Other (See Comments)     The results of significant diagnostics from this hospitalization (including imaging, microbiology, ancillary and laboratory) are listed below for reference.   Consultations:  Procedures/Studies: NM Myocar Multi W/Spect W/Wall Motion / EF  Result Date: 04/13/2023   The study is normal. The study is low risk.   No ST deviation was noted.   LV perfusion is normal. There is no evidence of ischemia. There is no evidence of infarction.   Left ventricular function is normal. Nuclear stress EF: 60%. The left ventricular ejection fraction is normal (55-65%). End diastolic cavity size is normal. End systolic cavity size is normal. Normal myocardial perfusion scan No evidence of stress-induced myocardial ischemia Fraction of 60% with normal wall motion This is a low risk study   ECHOCARDIOGRAM COMPLETE  Result Date: 04/13/2023    ECHOCARDIOGRAM REPORT   Patient Name:   HANEEN RATTS Date of Exam: 04/13/2023 Medical Rec #:  [id number]      Height:       65.0 in Accession #:    [id number]     Weight:       250.0 lb Date of Birth:  Oct 27, 1960     BSA:          2.175 m Patient Age:    62 years       BP:           165/87 mmHg Patient Gender: F              HR:           56 bpm. Exam Location:  [facility] Procedure: 2D Echo, Cardiac Doppler and Color Doppler Indications:     Chest Pain  History:         Patient has no prior history of Echocardiogram examinations.                  Signs/Symptoms:Chest Pain and Edema; Risk Factors:Hypertension,                  Diabetes and Dyslipidemia.  Sonographer:     Mikki Harbor Referring Phys:  [id number] Eliezer Mccoy PATEL Diagnosing Phys: Yvonne Kendall MD  Sonographer Comments: Suboptimal subcostal window and patient is obese. Image acquisition challenging due to respiratory motion. IMPRESSIONS  1. Left ventricular ejection fraction, by estimation, is 60 to 65%. The left ventricle has normal function. The left ventricle has no regional wall motion abnormalities. Left ventricular diastolic parameters were normal.  2. Right ventricular systolic function is normal. The right ventricular size is normal. There is normal pulmonary artery systolic pressure.  3. The mitral valve is normal in structure. Trivial mitral valve regurgitation. No evidence of mitral stenosis.  4. The aortic valve is tricuspid. Aortic valve regurgitation is trivial. No aortic stenosis is present. FINDINGS  Left Ventricle: Left ventricular ejection fraction, by estimation, is 60 to 65%. The left ventricle has normal function. The left ventricle has no regional wall motion abnormalities. The left ventricular internal cavity size was normal in size. There is  no left ventricular hypertrophy. Left ventricular diastolic parameters were normal. Right Ventricle: The right ventricular size is normal. No increase in right ventricular wall thickness. Right ventricular systolic function is normal. There is normal pulmonary artery systolic pressure. Left Atrium: Left atrial size was normal in size. Right Atrium: Right atrial size was normal in size. Pericardium: There is no evidence of pericardial effusion. Mitral Valve: The mitral valve is normal in structure. There is mild thickening of the mitral valve leaflet(s). Trivial mitral valve regurgitation. No evidence of mitral valve stenosis. MV peak gradient, 3.6 mmHg. The mean mitral valve gradient is 1.0 mmHg. Tricuspid Valve:  The tricuspid valve is normal in structure. Tricuspid valve regurgitation is mild. Aortic Valve: The aortic valve is tricuspid. Aortic valve regurgitation  is trivial. No aortic stenosis is present. Aortic valve mean gradient measures 4.0 mmHg. Aortic valve peak gradient measures 8.0 mmHg. Aortic valve area, by VTI measures 2.98 cm. Pulmonic Valve: The pulmonic valve was normal in structure. Pulmonic valve regurgitation is trivial. No evidence of pulmonic stenosis. Aorta: The aortic root is normal in size and structure. Pulmonary Artery: The pulmonary artery is of normal size. Venous: The inferior vena cava was not well visualized. IAS/Shunts: No atrial level shunt detected by color flow Doppler.  LEFT VENTRICLE PLAX 2D LVIDd:         4.50 cm   Diastology LVIDs:         2.80 cm   LV e' medial:    7.29 cm/s LV PW:         0.90 cm   LV E/e' medial:  14.5 LV IVS:        0.93 cm   LV e' lateral:   11.20 cm/s LVOT diam:     2.10 cm   LV E/e' lateral: 9.5 LV SV:         106 LV SV Index:   49 LVOT Area:     3.46 cm  RIGHT VENTRICLE RV Basal diam:  4.00 cm RV Mid diam:    3.70 cm RV S prime:     12.60 cm/s TAPSE (M-mode): 2.5 cm LEFT ATRIUM             Index        RIGHT ATRIUM           Index LA diam:        4.00 cm 1.84 cm/m   RA Area:     12.10 cm LA Vol (A2C):   57.7 ml 26.53 ml/m  RA Volume:   24.00 ml  11.03 ml/m LA Vol (A4C):   58.1 ml 26.71 ml/m LA Biplane Vol: 59.8 ml 27.50 ml/m  AORTIC VALVE                    PULMONIC VALVE AV Area (Vmax):    2.80 cm     PV Vmax:       1.10 m/s AV Area (Vmean):   2.98 cm     PV Peak grad:  4.8 mmHg AV Area (VTI):     2.98 cm AV Vmax:           141.00 cm/s AV Vmean:          86.900 cm/s AV VTI:            0.354 m AV Peak Grad:      8.0 mmHg AV Mean Grad:      4.0 mmHg LVOT Vmax:         114.00 cm/s LVOT Vmean:        74.800 cm/s LVOT VTI:          0.305 m LVOT/AV VTI ratio: 0.86  AORTA Ao Root diam: 3.20 cm MITRAL VALVE                TRICUSPID VALVE MV Area (PHT): 3.40 cm     TR Peak grad:   12.8 mmHg MV Area VTI:   2.99 cm     TR Vmax:        179.00 cm/s MV Peak grad:  3.6 mmHg MV Mean grad:  1.0 mmHg  SHUNTS MV  Vmax:       0.95 m/s     Systemic VTI:  0.30 m MV Vmean:      53.5 cm/s    Systemic Diam: 2.10 cm MV Decel Time: 223 msec MV E velocity: 106.00 cm/s MV A velocity: 72.30 cm/s MV E/A ratio:  1.47 Yvonne Kendall MD Electronically signed by Yvonne Kendall MD Signature Date/Time: 04/13/2023/4:21:22 PM    Final    CT Angio Chest Pulmonary Embolism (PE) W or WO Contrast  Result Date: 04/13/2023 CLINICAL DATA:  62 year old female with history of chest pain and shortness of breath. EXAM: CT ANGIOGRAPHY CHEST WITH CONTRAST TECHNIQUE: Multidetector CT imaging of the chest was performed using the standard protocol during bolus administration of intravenous contrast. Multiplanar CT image reconstructions and MIPs were obtained to evaluate the vascular anatomy. RADIATION DOSE REDUCTION: This exam was performed according to the departmental dose-optimization program which includes automated exposure control, adjustment of the mA and/or kV according to patient size and/or use of iterative reconstruction technique. CONTRAST:  75mL OMNIPAQUE IOHEXOL 350 MG/ML SOLN COMPARISON:  Chest CTA 09/03/2014. FINDINGS: Cardiovascular: There are no filling defects within the pulmonary arterial tree to suggest pulmonary embolism. Heart size is normal. There is no significant pericardial fluid, thickening or pericardial calcification. There is aortic atherosclerosis, as well as atherosclerosis of the great vessels of the mediastinum and the coronary arteries, including calcified atherosclerotic plaque in the left anterior descending and left circumflex coronary arteries. Mediastinum/Nodes: No pathologically enlarged mediastinal or hilar lymph nodes. Hilar esophagus is unremarkable in appearance. No axillary lymphadenopathy. Lungs/Pleura: Elevation of the right hemidiaphragm. Areas of mild linear scarring/atelectasis are noted in the right middle lobe and right lower lobe. No acute consolidative airspace disease. No pleural effusions. No  definite suspicious appearing pulmonary nodules or masses are noted. Upper Abdomen: Status post cholecystectomy. Musculoskeletal: There are no aggressive appearing lytic or blastic lesions noted in the visualized portions of the skeleton. Review of the MIP images confirms the above findings. IMPRESSION: 1. No evidence of pulmonary embolism. 2. Elevation of the right hemidiaphragm with areas of atelectasis and/or scarring in the right lung base. 3. Aortic atherosclerosis, in addition to two-vessel coronary artery disease. Please note that although the presence of coronary artery calcium documents the presence of coronary artery disease, the severity of this disease and any potential stenosis cannot be assessed on this non-gated CT examination. Assessment for potential risk factor modification, dietary therapy or pharmacologic therapy may be warranted, if clinically indicated. Aortic Atherosclerosis (ICD10-I70.0). Electronically Signed   By: Trudie Reed M.D.   On: 04/13/2023 06:22   DG Chest 2 View  Result Date: 04/12/2023 CLINICAL DATA:  Shortness of breath EXAM: CHEST - 2 VIEW COMPARISON:  Chest x-ray 04/09/2023 FINDINGS: There is stable mild elevation of the right hemidiaphragm with atelectasis in the right lung base. The lungs are otherwise clear. There is no pleural effusion or pneumothorax. Cardiomediastinal silhouette is within normal limits. IMPRESSION: Stable mild elevation of the right hemidiaphragm with atelectasis in the right lung base. Electronically Signed   By: Darliss Cheney M.D.   On: 04/12/2023 20:32   DG Chest 2 View  Result Date: 04/09/2023 CLINICAL DATA:  Chest pain. EXAM: CHEST - 2 VIEW COMPARISON:  August 14, 2022 FINDINGS: The heart size and mediastinal contours are within normal limits. There is moderate severity elevation of the right hemidiaphragm. This represents a new finding when compared to the prior study. Mild linear atelectasis is noted within the right  lung base. No  acute infiltrate, pleural effusion or pneumothorax is identified. The visualized skeletal structures are unremarkable. IMPRESSION: Moderate severity elevation of the right hemidiaphragm with mild right basilar linear atelectasis. Electronically Signed   By: Aram Candela M.D.   On: 04/09/2023 21:22   DG Shoulder Right  Result Date: 03/16/2023 CLINICAL DATA:  Right shoulder pain after fall 4 days ago. EXAM: RIGHT SHOULDER - 2+ VIEW COMPARISON:  None Available. FINDINGS: There is no evidence of fracture or dislocation. Mild degenerative changes seen involving the acromioclavicular joint. Soft tissues are unremarkable. 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 No acute abnormality seen. Electronically Signed   By: Lupita Raider M.D.   On: 03/16/2023 18:42      Labs: BNP (last 3 results) Recent Labs    04/13/23 0139  BNP 120.2*   Basic Metabolic Panel: Recent Labs  Lab 04/09/23 1740 04/12/23 1704 04/13/23 0139  NA 137 141  --   K 3.0* 3.0*  --   CL 101 105  --   CO2 26 28  --   GLUCOSE 130* 55*  --   BUN 16 21*  --   CREATININE 1.02* 0.98  --   CALCIUM 9.0 9.1  --   MG  --   --  2.3   Liver Function Tests: Recent Labs  Lab 04/09/23 1740 04/12/23 1704  AST 21 20  ALT 21 21  ALKPHOS 108 91  BILITOT 0.3 0.3  PROT 7.6 7.3  ALBUMIN 3.9 3.6   No results for input(s): "LIPASE", "AMYLASE" in the last 168 hours. No results for input(s): "AMMONIA" in the last 168 hours. CBC: Recent Labs  Lab 04/09/23 1740 04/12/23 1704  WBC 7.8 10.4  HGB 13.5 12.7  HCT 41.3 38.5  MCV 85.9 83.3  PLT 278 310   Cardiac Enzymes: No results for input(s): "CKTOTAL", "CKMB", "CKMBINDEX", "TROPONINI" in the last 168 hours. BNP: Invalid input(s): "POCBNP" CBG: Recent Labs  Lab 04/12/23 2137 04/13/23 0249 04/13/23 0457 04/13/23 0715 04/13/23 1549  GLUCAP 88 290* 220* 165* 200*   D-Dimer Recent Labs    04/13/23 0139  DDIMER 0.34   Hgb  A1c No results for input(s): "HGBA1C" in the last 72 hours. Lipid Profile Recent Labs    04/13/23 0139  CHOL 180  HDL 95  LDLCALC 71  TRIG 71  CHOLHDL 1.9   Thyroid function studies Recent Labs    04/13/23 0139  TSH 0.556   Anemia work up No results for input(s): "VITAMINB12", "FOLATE", "FERRITIN", "TIBC", "IRON", "RETICCTPCT" in the last 72 hours. Urinalysis    Component Value Date/Time   COLORURINE YELLOW 06/05/2021 1938   APPEARANCEUR CLEAR 06/05/2021 1938   APPEARANCEUR Clear 05/26/2013 1843   LABSPEC 1.015 06/05/2021 1938   LABSPEC 1.018 05/26/2013 1843   PHURINE 5.0 06/05/2021 1938   GLUCOSEU >=500 (A) 06/05/2021 1938   GLUCOSEU >=500 05/26/2013 1843   HGBUR SMALL (A) 06/05/2021 1938   HGBUR trace-intact 07/29/2009 1507   BILIRUBINUR NEGATIVE 06/05/2021 1938   BILIRUBINUR 1 mg/dL 16/03/9603 5409   BILIRUBINUR Negative 05/26/2013 1843   KETONESUR NEGATIVE 06/05/2021 1938   PROTEINUR NEGATIVE 06/05/2021 1938   UROBILINOGEN 0.2 01/22/2021 1300   UROBILINOGEN 0.2 02/07/2011 2005   NITRITE NEGATIVE 06/05/2021 1938   LEUKOCYTESUR NEGATIVE 06/05/2021 1938   LEUKOCYTESUR Negative 05/26/2013 1843   Sepsis Labs Recent Labs  Lab 04/09/23 1740 04/12/23 1704  WBC 7.8 10.4   Microbiology Recent Results (from the past 240  hour(s))  Resp panel by RT-PCR (RSV, Flu A&B, Covid) Anterior Nasal Swab     Status: None   Collection Time: 04/09/23  6:24 PM   Specimen: Anterior Nasal Swab  Result Value Ref Range Status   SARS Coronavirus 2 by RT PCR NEGATIVE NEGATIVE Final    Comment: (NOTE) SARS-CoV-2 target nucleic acids are NOT DETECTED.  The SARS-CoV-2 RNA is generally detectable in upper respiratory specimens during the acute phase of infection. The lowest concentration of SARS-CoV-2 viral copies this assay can detect is 138 copies/mL. A negative result does not preclude SARS-Cov-2 infection and should not be used as the sole basis for treatment or other patient  management decisions. A negative result may occur with  improper specimen collection/handling, submission of specimen other than nasopharyngeal swab, presence of viral mutation(s) within the areas targeted by this assay, and inadequate number of viral copies(<138 copies/mL). A negative result must be combined with clinical observations, patient history, and epidemiological information. The expected result is Negative.  Fact Sheet for Patients:  BloggerCourse.com  Fact Sheet for Healthcare Providers:  SeriousBroker.it  This test is no t yet approved or cleared by the [place] FDA and  has been authorized for detection and/or diagnosis of SARS-CoV-2 by FDA under an Emergency Use Authorization (EUA). This EUA will remain  in effect (meaning this test can be used) for the duration of the COVID-19 declaration under Section 564(b)(1) of the Act, 21 U.S.C.section 360bbb-3(b)(1), unless the authorization is terminated  or revoked sooner.       Influenza A by PCR NEGATIVE NEGATIVE Final   Influenza B by PCR NEGATIVE NEGATIVE Final    Comment: (NOTE) The Xpert Xpress SARS-CoV-2/FLU/RSV plus assay is intended as an aid in the diagnosis of influenza from Nasopharyngeal swab specimens and should not be used as a sole basis for treatment. Nasal washings and aspirates are unacceptable for Xpert Xpress SARS-CoV-2/FLU/RSV testing.  Fact Sheet for Patients: BloggerCourse.com  Fact Sheet for Healthcare Providers: SeriousBroker.it  This test is not yet approved or cleared by the [place] FDA and has been authorized for detection and/or diagnosis of SARS-CoV-2 by FDA under an Emergency Use Authorization (EUA). This EUA will remain in effect (meaning this test can be used) for the duration of the COVID-19 declaration under Section 564(b)(1) of the Act, 21 U.S.C. section 360bbb-3(b)(1),  unless the authorization is terminated or revoked.     Resp Syncytial Virus by PCR NEGATIVE NEGATIVE Final    Comment: (NOTE) Fact Sheet for Patients: BloggerCourse.com  Fact Sheet for Healthcare Providers: SeriousBroker.it  This test is not yet approved or cleared by the [place] FDA and has been authorized for detection and/or diagnosis of SARS-CoV-2 by FDA under an Emergency Use Authorization (EUA). This EUA will remain in effect (meaning this test can be used) for the duration of the COVID-19 declaration under Section 564(b)(1) of the Act, 21 U.S.C. section 360bbb-3(b)(1), unless the authorization is terminated or revoked.  Performed at [facility], [address], [place], [place] [address]      Total time spend on discharging this patient, including the last patient exam, discussing the hospital stay, instructions for ongoing care as it relates to all pertinent caregivers, as well as preparing the medical discharge records, prescriptions, and/or referrals as applicable, is *** minutes.    Darlin Priestly, MD  [facility] 04/13/2023, 7:21 PM

## 2023-04-20 ENCOUNTER — Other Ambulatory Visit: Payer: Self-pay

## 2023-04-20 MED ORDER — ACCU-CHEK GUIDE TEST VI STRP: ORAL_STRIP | 3 refills | Status: DC

## 2023-04-20 NOTE — Telephone Encounter (Signed)
Requested Prescriptions   Signed Prescriptions Disp Refills   glucose blood (ACCU-CHEK GUIDE TEST) test strip 500 each 3    Sig: Use to check blood sugar 4-5 times a day.    Authorizing Provider: Carlus Pavlov    Ordering User: Valorie Roosevelt S   Pt has been notified as well.

## 2023-04-21 ENCOUNTER — Ambulatory Visit (INDEPENDENT_AMBULATORY_CARE_PROVIDER_SITE_OTHER): Payer: BC Managed Care – PPO | Admitting: Family Medicine

## 2023-04-21 ENCOUNTER — Encounter: Payer: Self-pay | Admitting: Family Medicine

## 2023-04-21 VITALS — BP 122/80 | HR 77 | Temp 98.1°F | Ht 65.0 in | Wt 246.2 lb

## 2023-04-21 DIAGNOSIS — J4520 Mild intermittent asthma, uncomplicated: Secondary | ICD-10-CM | POA: Diagnosis not present

## 2023-04-21 DIAGNOSIS — R079 Chest pain, unspecified: Secondary | ICD-10-CM | POA: Diagnosis not present

## 2023-04-21 DIAGNOSIS — K5909 Other constipation: Secondary | ICD-10-CM

## 2023-04-21 DIAGNOSIS — I2510 Atherosclerotic heart disease of native coronary artery without angina pectoris: Secondary | ICD-10-CM | POA: Insufficient documentation

## 2023-04-21 DIAGNOSIS — E785 Hyperlipidemia, unspecified: Secondary | ICD-10-CM

## 2023-04-21 DIAGNOSIS — E876 Hypokalemia: Secondary | ICD-10-CM | POA: Diagnosis not present

## 2023-04-21 DIAGNOSIS — I10 Essential (primary) hypertension: Secondary | ICD-10-CM | POA: Diagnosis not present

## 2023-04-21 DIAGNOSIS — E1169 Type 2 diabetes mellitus with other specified complication: Secondary | ICD-10-CM

## 2023-04-21 DIAGNOSIS — Z7984 Long term (current) use of oral hypoglycemic drugs: Secondary | ICD-10-CM

## 2023-04-21 DIAGNOSIS — I700 Atherosclerosis of aorta: Secondary | ICD-10-CM | POA: Diagnosis not present

## 2023-04-21 DIAGNOSIS — Z794 Long term (current) use of insulin: Secondary | ICD-10-CM

## 2023-04-21 DIAGNOSIS — F418 Other specified anxiety disorders: Secondary | ICD-10-CM | POA: Diagnosis not present

## 2023-04-21 NOTE — Assessment & Plan Note (Signed)
Continues psychiatric care ? If tricyclic med would help chronic pain in future

## 2023-04-21 NOTE — Assessment & Plan Note (Signed)
After recemt hosp for non cardiac cp Reviewed hospital records, lab results and studies in detail  bp in fair control at this time  BP Readings from Last 1 Encounters:  04/21/23 122/80   No changes needed but will watch for symptoms of hypotension  Most recent labs reviewed  Disc lifstyle change with low sodium diet and exercise  Stable with hctz 25 mg daily  Sees endo for dm2

## 2023-04-21 NOTE — Patient Instructions (Addendum)
Take miralax three times per day until bowel movements are more regular  Then decide how much you need  Keep taking it  Eat fruits and veggies and drink fluids   If you need to use milk of magnesia or magnesium citrate occasionally with caution   For gas No carbonation No straws   Labs for potassium today   Take care of yourself

## 2023-04-21 NOTE — Assessment & Plan Note (Signed)
Noted on recent CTA No symptoms currently  LDL is down to 71 (statin intol) Blood pressure controlled  Will continue to monitor

## 2023-04-21 NOTE — Assessment & Plan Note (Signed)
Recent flare treatment with prednisone  Reassuring exam today  Flared usually by illness and stress and season change  Has albuterol mri to use prn

## 2023-04-21 NOTE — Assessment & Plan Note (Signed)
Recent hosp for this  Non cardiac Reviewed hospital records, lab results and studies in detail   Some left lower/lateral rib tenderness today  Recommend warm compresses

## 2023-04-21 NOTE — Assessment & Plan Note (Signed)
From recent hosp Lab Results  Component Value Date   K 3.0 (L) 04/12/2023    Re check today  Was supplemented

## 2023-04-21 NOTE — Assessment & Plan Note (Signed)
No doubt worsened by chronic narcotics Encouraged use of miralax tid to effect then titrate Minimize stimulant product unless abs necessary (like mag citrate or MOM) Encouraged good fiber and fluid intake
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
## 2023-04-21 NOTE — Assessment & Plan Note (Signed)
 Discussed how this problem influences overall health and the risks it imposes  Reviewed plan for weight loss with lower calorie diet (via better food choices (lower glycemic and portion control) along with exercise building up to or more than 30 minutes 5 days per week including some aerobic activity and strength training

## 2023-04-21 NOTE — Assessment & Plan Note (Signed)
Disc goals for lipids and reasons to control them Rev last labs with pt Rev low sat fat diet in detail  LDL is down to 71 with zetia alone and diet  Intol of statins   In setting of dm and CAD goal is LDL under 70

## 2023-04-21 NOTE — Assessment & Plan Note (Signed)
Evidence of 2 vessel dz incidentally on CTA without obstruction in a diabetic Echo and stress tests WNL Chest pain from recent hosp was non cardiac Reviewed hospital records, lab results and studies in detail    Zetia for lipids - LDL 71 (goal under 70)  Intol of statin  Blood pressure is controlled  Will continue to monitor  Would be a good idea to est with cardiology in the future, pt is not ready for referral yet  Discussed signs and symptoms of angina to watch for

## 2023-04-22 LAB — BASIC METABOLIC PANEL
BUN: 16 mg/dL (ref 6–23)
CO2: 32 meq/L (ref 19–32)
Calcium: 9.3 mg/dL (ref 8.4–10.5)
Chloride: 102 meq/L (ref 96–112)
Creatinine, Ser: 1.11 mg/dL (ref 0.40–1.20)
GFR: 54.18 mL/min — ABNORMAL LOW (ref >60.00)
Glucose, Bld: 60 mg/dL — ABNORMAL LOW (ref 70–99)
Potassium: 3.6 meq/L (ref 3.5–5.1)
Sodium: 144 meq/L (ref 135–145)

## 2023-05-06 ENCOUNTER — Telehealth: Payer: Self-pay | Admitting: Family Medicine

## 2023-05-06 NOTE — Telephone Encounter (Signed)
Pt called in requesting a referral to a Gastroenterologist. Pt states she constantly has stomach pain & feels nauseous. Pt states she rarely has a appetite, but when she attempts to eat, her nausea worsens. Pt mentioned she last saw Dr. Milinda Antis on 11/20 for a Hosp f/u. Pt states she preferes to be seen at [facility] in [place], [place]. Pt stated she use to see a provider in the office about a yr ago. Call back # [phone]

## 2023-05-06 NOTE — Telephone Encounter (Signed)
Pt is still considered a pt with [facility] since it was just a year ago she was seen she just needs to call and schedule an appt with them.  Called pt and no answer so per DPR left VM letting pt know she can call and just schedule an appt with  GI and left their phone # on the VM

## 2023-05-07 DIAGNOSIS — E1165 Type 2 diabetes mellitus with hyperglycemia: Secondary | ICD-10-CM | POA: Diagnosis not present

## 2023-05-07 DIAGNOSIS — Z794 Long term (current) use of insulin: Secondary | ICD-10-CM | POA: Diagnosis not present

## 2023-05-24 ENCOUNTER — Other Ambulatory Visit: Payer: Self-pay | Admitting: Internal Medicine

## 2023-05-25 ENCOUNTER — Other Ambulatory Visit: Payer: Self-pay | Admitting: Internal Medicine

## 2023-05-25 ENCOUNTER — Other Ambulatory Visit: Payer: Self-pay | Admitting: Family Medicine

## 2023-06-06 DIAGNOSIS — Z794 Long term (current) use of insulin: Secondary | ICD-10-CM | POA: Diagnosis not present

## 2023-06-06 DIAGNOSIS — E1165 Type 2 diabetes mellitus with hyperglycemia: Secondary | ICD-10-CM | POA: Diagnosis not present

## 2023-06-09 ENCOUNTER — Telehealth: Payer: Self-pay | Admitting: Family Medicine

## 2023-06-09 NOTE — Telephone Encounter (Signed)
 Not something I can approve

## 2023-06-09 NOTE — Telephone Encounter (Signed)
 Copied from CRM [id number]. Topic: General - Other >> Jun 09, 2023  1:07 PM Franky GRADE wrote: Reason for CRM: Selinda from [facility] is calling to confirm if we received a fax that was sent out this morning for a test pre authorization request form. If so the provider just needs to sign off and faxed back. Selinda [phone] fax: [phone].

## 2023-06-09 NOTE — Telephone Encounter (Signed)
 Not sure if this is something PCP would approve. Form printed from [employer] and placed in PCP inbox for review

## 2023-06-10 NOTE — Telephone Encounter (Signed)
 Faxed back form with denial

## 2023-06-21 ENCOUNTER — Ambulatory Visit (INDEPENDENT_AMBULATORY_CARE_PROVIDER_SITE_OTHER)
Admission: RE | Admit: 2023-06-21 | Discharge: 2023-06-21 | Disposition: A | Discharge status: Home / Self Care | Payer: BC Managed Care – PPO | Source: Ambulatory Visit | Attending: Family Medicine | Admitting: Family Medicine

## 2023-06-21 ENCOUNTER — Encounter: Payer: Self-pay | Admitting: Family Medicine

## 2023-06-21 ENCOUNTER — Ambulatory Visit (INDEPENDENT_AMBULATORY_CARE_PROVIDER_SITE_OTHER): Payer: BC Managed Care – PPO | Admitting: Family Medicine

## 2023-06-21 VITALS — BP 126/88 | HR 80 | Temp 97.8°F | Ht 65.0 in | Wt 243.0 lb

## 2023-06-21 DIAGNOSIS — J4520 Mild intermittent asthma, uncomplicated: Secondary | ICD-10-CM | POA: Diagnosis not present

## 2023-06-21 DIAGNOSIS — L304 Erythema intertrigo: Secondary | ICD-10-CM | POA: Diagnosis not present

## 2023-06-21 DIAGNOSIS — E785 Hyperlipidemia, unspecified: Secondary | ICD-10-CM

## 2023-06-21 DIAGNOSIS — Z794 Long term (current) use of insulin: Secondary | ICD-10-CM | POA: Diagnosis not present

## 2023-06-21 DIAGNOSIS — R059 Cough, unspecified: Secondary | ICD-10-CM | POA: Diagnosis not present

## 2023-06-21 DIAGNOSIS — E1169 Type 2 diabetes mellitus with other specified complication: Secondary | ICD-10-CM | POA: Diagnosis not present

## 2023-06-21 DIAGNOSIS — R9389 Abnormal findings on diagnostic imaging of other specified body structures: Secondary | ICD-10-CM | POA: Diagnosis not present

## 2023-06-21 DIAGNOSIS — R052 Subacute cough: Secondary | ICD-10-CM

## 2023-06-21 DIAGNOSIS — J9811 Atelectasis: Secondary | ICD-10-CM | POA: Diagnosis not present

## 2023-06-21 DIAGNOSIS — J40 Bronchitis, not specified as acute or chronic: Secondary | ICD-10-CM | POA: Diagnosis not present

## 2023-06-21 MED ORDER — KETOCONAZOLE 2 % EX CREA: 1.0000 | TOPICAL_CREAM | Freq: Every day | CUTANEOUS | 1 refills | Status: DC | PRN

## 2023-06-21 MED ORDER — PREDNISONE 20 MG PO TABS: 20.0000 mg | ORAL_TABLET | Freq: Every day | ORAL | 0 refills | Status: DC

## 2023-06-21 MED ORDER — BUDESONIDE-FORMOTEROL FUMARATE 80-4.5 MCG/ACT IN AERO: 2.0000 | INHALATION_SPRAY | Freq: Two times a day (BID) | RESPIRATORY_TRACT | 3 refills | Status: DC

## 2023-06-21 MED ORDER — BENZONATATE 200 MG PO CAPS: 200.0000 mg | ORAL_CAPSULE | Freq: Three times a day (TID) | ORAL | 1 refills | Status: DC | PRN

## 2023-06-21 NOTE — Assessment & Plan Note (Signed)
With increase asthma See a/p asthma

## 2023-06-21 NOTE — Progress Notes (Signed)
Subjective:    Patient ID: Alexa Woods, female    DOB: 1960/07/22, 63 y.o.   MRN: [id number]  HPI  Wt Readings from Last 3 Encounters:  06/21/23 243 lb (110.2 kg)  04/21/23 246 lb 4 oz (111.7 kg)  04/09/23 250 lb (113.4 kg)   40.44 kg/m  Vitals:   06/21/23 1526  BP: 126/88  Pulse: 80  Temp: 97.8 F (36.6 C)  SpO2: 94%    Pt presents with lung issues  Also rash under breasts   Feels like asthma is worsening  Also coughs a lot  Every since end of November   Triggered by exertion  Also cold air  Also infection (? If had a cold recently)  Strong smell- husband burned something in the oven     Has a history of asthma  Uses albuterol mdi prn -uses every 4 hours     ECHO 04/2023 normal Normal myocardial perfusion scan 04/2023 as well   Cxr today DG Chest 2 View Result Date: 06/21/2023 CLINICAL DATA:  Dry cough for the past 2 months. EXAM: CHEST - 2 VIEW COMPARISON:  04/12/2023 FINDINGS: Stable mild elevation of the right hemidiaphragm and linear scarring/chronic atelectasis in the right middle lobe. The remainder of the lungs remain clear. Normal-sized heart. Mild-to-moderate peribronchial thickening. Unremarkable bones. IMPRESSION: Mild-to-moderate bronchitic changes. Electronically Signed   By: Beckie Salts M.D.   On: 06/21/2023 16:35    Continues endo care for DM Some low glucose readings at night   Lab Results  Component Value Date   CHOL 180 04/13/2023   HDL 95 04/13/2023   LDLCALC 71 04/13/2023   LDLDIRECT 97.0 11/06/2020   TRIG 71 04/13/2023   CHOLHDL 1.9 04/13/2023     Patient Active Problem List   Diagnosis Date Noted   CAD (coronary artery disease) 04/21/2023   Chest pain 04/13/2023   Right shoulder injury 03/16/2023   Pedal edema 01/01/2023   Frequent falls 08/14/2022   Right-sided chest wall pain 08/14/2022   Statin myopathy 07/29/2022   Itching 07/29/2022   Aortic atherosclerosis (HCC) 07/29/2022   Chronic pain 02/10/2022    Transportation insecurity 05/09/2021   Financial insecurity 05/09/2021   Colon cancer screening 11/08/2020   Current use of proton pump inhibitor 11/04/2020   History of total knee replacement, right 10/21/2020   Trigger finger of right hand 12/20/2019   Dry eyes 09/05/2019   Intractable headache 08/25/2019   Hypothyroidism 04/04/2019   Screening mammogram, encounter for 04/03/2019   Routine general medical examination at a health care facility 04/03/2019   Encounter for screening for HIV 04/03/2019   Vitamin D deficiency 04/03/2019   Intertrigo 02/21/2019   Chronic constipation 12/26/2017   History of left knee replacement 12/26/2017   Primary osteoarthritis involving multiple joints 12/13/2017   Primary osteoarthritis of left knee 12/06/2017   Chronic neck pain 08/13/2017   Pain in left knee 06/22/2017   Morbid obesity (HCC) 01/21/2017   Joint swelling 12/20/2013   Periodontal disease 02/03/2013   Hyperlipidemia associated with type 2 diabetes mellitus (HCC) 09/27/2012   Cough 03/04/2011   Low back pain 07/22/2010   Hypokalemia 12/12/2008   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   Type 2 diabetes mellitus with peripheral neuropathy (HCC) 11/30/2006   History of alcohol abuse 11/30/2006   Depression with anxiety 11/30/2006   Essential hypertension 11/30/2006   Hemorrhoids 11/30/2006   Allergic rhinitis 11/30/2006   Asthma 11/30/2006   GERD without esophagitis 11/30/2006   Psoriasis 11/30/2006  Past Medical History:  Diagnosis Date   Alcohol abuse, in remission    since 1998   Allergic rhinitis    Anemia    Anxiety    Asthma    Bilateral lower extremity edema    Bulging lumbar disc    Bulging of cervical intervertebral disc    Chronic constipation    DDD (degenerative disc disease), lumbosacral    Depression    Dyspnea    Gastroparesis    GERD (gastroesophageal reflux disease)    Hemorrhoids    History of Bell's palsy 08/2007   left   History of chronic  cystitis    IBS (irritable bowel syndrome)    Insulin dependent type 2 diabetes mellitus [facility])    endocrinologist-- dr Elvera Lennox   Lower urinary tract symptoms (LUTS)    OA (osteoarthritis)    knees, back, hands, elbows   OSA (obstructive sleep apnea)    per study 06-08-2017 mild osa , cpap recommended , per pt insurance issue   Peripheral neuropathy    PONV (postoperative nausea and vomiting)    Psoriasis    S/P dilatation of esophageal stricture    Unspecified essential hypertension    Wears partial dentures    lower   Past Surgical History:  Procedure Laterality Date   CHOLECYSTECTOMY OPEN  1990   AND APPENDECTOMY   DOBUTAMINE STRESS ECHO  2009    normal stress echo, no evidence of ischemia   ELBOW SURGERY Bilateral RIGHT 2013;  LEFT 2016   for nerve damage   ESOPHAGOGASTRODUODENOSCOPY  07/2002   erythematous gastropathy   eye sugery Bilateral    KNEE ARTHROSCOPY Left 11/ 2018   dr Lequita Halt  @ [facility]   KNEE ARTHROSCOPY W/ PARTIAL MEDIAL MENISCECTOMY Right 10/2008   and chondroplasty   SHOULDER ARTHROSCOPY WITH DISTAL CLAVICLE RESECTION Left 12/ 2011   dr dean   TOTAL KNEE ARTHROPLASTY Right 07-01-2009  dr Lequita Halt   [facility]   TOTAL KNEE ARTHROPLASTY Left 12/06/2017   Procedure: LEFT TOTAL LEFT KNEE ARTHROPLASTY;  Surgeon: Ollen Gross, MD;  Location: [facility];  Service: Orthopedics;  Laterality: Left;   Trigger finger surgery Left 07/2022   VAGINAL HYSTERECTOMY  2003   Social History   Tobacco Use   Smoking status: Former    Current packs/day: 0.00    Types: Cigarettes    Start date: 12/30/1976    Quit date: 12/30/2001    Years since quitting: 21.4   Smokeless tobacco: Never  Vaping Use   Vaping status: Never Used  Substance Use Topics   Alcohol use: No    Alcohol/week: 0.0 standard drinks of alcohol    Comment: hx alcoholism--  stopped 1998    Drug use: No   Family History  Problem Relation Age of Onset   Alcohol abuse Mother    Hypertension Mother    Diabetes Mother     Heart failure Mother    Diabetes Sister    Heart attack Sister    Diabetes Sister    Diabetes Sister    Diabetes Sister    Celiac disease Daughter    Esophageal cancer Maternal Grandmother    Diabetes Brother    Parkinson's disease Brother    Heart attack Brother    Heart attack Brother 72   Diabetes Brother    Diabetes Brother    Diabetes Brother    Thyroid disease Niece    Lung cancer Other        mat great uncle  Colon cancer Neg Hx    Breast cancer Neg Hx    Allergies  Allergen Reactions   Bupropion Hcl Other (See Comments)    Pt is unsure   Cefuroxime Axetil Nausea Only   Crestor [Rosuvastatin Calcium]     Pt states it makes her feel weird    Gabapentin Other (See Comments)    Pt does not remember reaction  Pt does not remember reaction    Lidocaine Other (See Comments)    REACTION: unknown REACTION: unknown   Metformin Other (See Comments)    REACTION: GI REACTION: GI   Other Other (See Comments)   Paroxetine Other (See Comments)    REACTION: doesn't agree REACTION: doesn't agree   Propoxyphene Other (See Comments)    wheezing wheezing   Tramadol Other (See Comments)    REACTION: Causes Anxiety   Tramadol Hcl     REACTION: Causes Anxiety   Wellbutrin [Bupropion] Other (See Comments)    Pt is unsure   Codeine Nausea And Vomiting and Rash   Sulfa Antibiotics Rash and Other (See Comments)   Current Outpatient Medications on File Prior to Visit  Medication Sig Dispense Refill   albuterol (VENTOLIN HFA) 108 (90 Base) MCG/ACT inhaler Inhale 2 puffs into the lungs every 4 (four) hours as needed for wheezing or shortness of breath. 18 g 2   B-D UF III MINI PEN NEEDLES 31G X 5 MM MISC USE AS INSTRUCTED FOR 4X DAILY INJECTIONS 400 each 1   BD INSULIN SYRINGE U/F 31G X 5/16" 0.5 ML MISC USE THREE TIMES PER DAY WITH R INSULIN & ONCE DAILY WITH LEVEMIR 400 each 2   cholecalciferol (VITAMIN D3) 25 MCG (1000 UNIT) tablet Take 2,000 Units by mouth daily.      dapagliflozin propanediol (FARXIGA) 10 MG TABS tablet Take 1 tablet (10 mg total) by mouth daily before breakfast. (Patient taking differently: Take 5 mg by mouth daily before breakfast.) 90 tablet 3   docusate sodium (COLACE) 100 MG capsule Take 100 mg by mouth every morning.     ezetimibe (ZETIA) 10 MG tablet TAKE 1 TABLET BY MOUTH EVERY DAY 90 tablet 1   fluticasone (FLONASE) 50 MCG/ACT nasal spray PLACE 2 SPRAYS INTO BOTH NOSTRILS DAILY AS NEEDED FOR ALLERGIES OR RHINITIS. 48 mL 1   glucose blood (ACCU-CHEK GUIDE TEST) test strip Use to check blood sugar 4-5 times a day. 500 each 3   hydrochlorothiazide (HYDRODIURIL) 25 MG tablet TAKE 1 TABLET EVERY DAY 90 tablet 1   insulin glargine (LANTUS SOLOSTAR) 100 UNIT/ML Solostar Pen INJECT 30-35 UNITS DAILY 45 mL 1   Insulin Pen Needle 32G X 4 MM MISC Use 4x a day 300 each 3   Insulin Regular Human (NOVOLIN R FLEXPEN RELION) 100 UNIT/ML KwikPen Inject 18-20 units under skin before each meal (Patient taking differently: Inject 12-18 Units into the skin 3 (three) times daily with meals. Uses sliding scale: 16-18 units before breakfast, 15-16 units before lunch, 12-14 units before dinner) 15 mL 1   KLOR-CON M10 10 MEQ tablet TAKE 1 TABLET BY MOUTH EVERY DAY 90 tablet 1   Lancets (ONETOUCH ULTRASOFT) lancets Use to test blood sugar 4 times daily as instructed. 200 each 11   levothyroxine (SYNTHROID) 50 MCG tablet Take 1 tablet (50 mcg total) by mouth daily before breakfast. 90 tablet 2   LORazepam (ATIVAN) 2 MG tablet Take 2 mg by mouth 2 (two) times daily.     naloxone (NARCAN) 4 MG/0.1ML LIQD nasal  spray kit      oxyCODONE-acetaminophen (PERCOCET) 10-325 MG tablet Take 1 tablet by mouth every 4 (four) hours as needed for pain. Hold for SBP < = 105 12 tablet 0   RESTASIS 0.05 % ophthalmic emulsion Place 1 drop into both eyes 2 (two) times daily.     sertraline (ZOLOFT) 100 MG tablet Take 200 mg by mouth every morning.     tiZANidine (ZANAFLEX) 4 MG tablet  Take 4 mg by mouth 2 (two) times daily as needed for muscle spasms.      zolpidem (AMBIEN) 10 MG tablet Take 10 mg by mouth at bedtime as needed for sleep.     No current facility-administered medications on file prior to visit.    Review of Systems  Constitutional:  Positive for fatigue. Negative for activity change, appetite change, fever and unexpected weight change.  HENT:  Negative for congestion, ear pain, rhinorrhea, sinus pressure and sore throat.   Eyes:  Negative for pain, redness and visual disturbance.  Respiratory:  Positive for cough, chest tightness, shortness of breath and wheezing. Negative for stridor.   Cardiovascular:  Negative for chest pain and palpitations.  Gastrointestinal:  Negative for abdominal pain, blood in stool, constipation and diarrhea.  Endocrine: Negative for polydipsia and polyuria.  Genitourinary:  Negative for dysuria, frequency and urgency.  Musculoskeletal:  Positive for arthralgias and myalgias. Negative for back pain.  Skin:  Positive for rash. Negative for pallor.  Allergic/Immunologic: Negative for environmental allergies.  Neurological:  Negative for dizziness, syncope and headaches.  Hematological:  Negative for adenopathy. Does not bruise/bleed easily.  Psychiatric/Behavioral:  Negative for decreased concentration and dysphoric mood. The patient is not nervous/anxious.        Objective:   Physical Exam Constitutional:      General: She is not in acute distress.    Appearance: Normal appearance. She is well-developed. She is obese. She is not ill-appearing or diaphoretic.  HENT:     Head: Normocephalic and atraumatic.  Eyes:     Conjunctiva/sclera: Conjunctivae normal.     Pupils: Pupils are equal, round, and reactive to light.  Neck:     Thyroid: No thyromegaly.     Vascular: No carotid bruit or JVD.  Cardiovascular:     Rate and Rhythm: Normal rate and regular rhythm.     Heart sounds: Normal heart sounds.     No gallop.   Pulmonary:     Effort: Pulmonary effort is normal. No respiratory distress.     Breath sounds: No stridor. Wheezing present. No rhonchi or rales.     Comments: Harsh bs  Scant wheeze at end of forced expiration  No prolonged exp time  No rales or crackles  Abdominal:     General: There is no distension or abdominal bruit.     Palpations: Abdomen is soft.  Musculoskeletal:     Cervical back: Normal range of motion and neck supple.     Right lower leg: No edema.     Left lower leg: No edema.  Lymphadenopathy:     Cervical: No cervical adenopathy.  Skin:    General: Skin is warm and dry.     Findings: Rash present.     Comments: Erythematous maculopapular rash under both breasts  Few excoriations  No scale and no discharge or vesicles Few satellite lesions   Neurological:     Mental Status: She is alert.     Coordination: Coordination normal.     Deep Tendon Reflexes:  Reflexes are normal and symmetric. Reflexes normal.  Psychiatric:        Mood and Affect: Mood normal.           Assessment & Plan:   Problem List Items Addressed This Visit       Respiratory   Asthma - Primary   Worsening  Pt has had more problems since exacerbation in November (reviewed ER notes and results from then)  Worsened historically by infection, cold air and strong smells  Wheezing ,  Shortness of breath on exertion (with normal recent echo and myocardial perf scan)-reviewed  Also dry cough  Needs albuterol mdi every 4 hours (used on way here so exam is fairly normal) Cxr ordered ; baseline mild r hemidiaphragm elevation, today mild to moderate bronchitic changes   Prednisone 20 mg daily for 5 d ordered (aware this will increase blood pressure) Tessalon for cough  Discussed starting long acting bronchodilator/steroid combo if covered as well and follow closely       Relevant Medications   predniSONE (DELTASONE) 20 MG tablet   budesonide-formoterol (SYMBICORT) 80-4.5 MCG/ACT inhaler    Other Relevant Orders   DG Chest 2 View (Completed)     Endocrine   Hyperlipidemia associated with type 2 diabetes mellitus (HCC)   Disc goals for lipids and reasons to control them Rev last labs with pt Rev low sat fat diet in detail  LDL is down to 71 with zetia alone and diet  Intol of statins   In setting of dm and CAD goal is LDL under 70         Musculoskeletal and Integument   Intertrigo   Under breasts /occational in groin Encouraged to keep area very clean and dry Trial of ketconazole cream and update   Is diabetic and obese         Other   Morbid obesity (HCC)   Discussed how this problem influences overall health and the risks it imposes  Reviewed plan for weight loss with lower calorie diet (via better food choices (lower glycemic and portion control) along with exercise building up to or more than 30 minutes 5 days per week including some aerobic activity and strength training   This may be playing a role in shortness of breath on exertion       Cough   With increase asthma See a/p asthma       Relevant Orders   DG Chest 2 View (Completed)

## 2023-06-21 NOTE — Assessment & Plan Note (Signed)
Under breasts /occational in groin Encouraged to keep area very clean and dry Trial of ketconazole cream and update   Is diabetic and obese

## 2023-06-21 NOTE — Assessment & Plan Note (Signed)
Discussed how this problem influences overall health and the risks it imposes  Reviewed plan for weight loss with lower calorie diet (via better food choices (lower glycemic and portion control) along with exercise building up to or more than 30 minutes 5 days per week including some aerobic activity and strength training   This may be playing a role in shortness of breath on exertion

## 2023-06-21 NOTE — Patient Instructions (Addendum)
Chest xray for cough and wheezing today  We will get result to you later today or tomorrow   Take prednisone 20 mg daily for 5 days (will make glucose high)   I also want to try you on a new maintenance inhaler to use regularly - once we get chest xray back I will send that   Try tessalon pearles for cough    Try the ketoconazole cream for rash under breasts  Dry very well after bathing before you use it

## 2023-06-21 NOTE — Assessment & Plan Note (Addendum)
Worsening  Pt has had more problems since exacerbation in November (reviewed ER notes and results from then)  Worsened historically by infection, cold air and strong smells  Wheezing ,  Shortness of breath on exertion (with normal recent echo and myocardial perf scan)-reviewed  Also dry cough  Needs albuterol mdi every 4 hours (used on way here so exam is fairly normal) Cxr ordered ; baseline mild r hemidiaphragm elevation, today mild to moderate bronchitic changes   Prednisone 20 mg daily for 5 d ordered (aware this will increase blood pressure) Tessalon for cough  Discussed starting long acting bronchodilator/steroid combo if covered as well and follow closely

## 2023-06-21 NOTE — Assessment & Plan Note (Signed)
 Disc goals for lipids and reasons to control them Rev last labs with pt Rev low sat fat diet in detail  LDL is down to 71 with zetia alone and diet  Intol of statins   In setting of dm and CAD goal is LDL under 70

## 2023-06-23 DIAGNOSIS — Z79891 Long term (current) use of opiate analgesic: Secondary | ICD-10-CM | POA: Diagnosis not present

## 2023-06-23 DIAGNOSIS — M5416 Radiculopathy, lumbar region: Secondary | ICD-10-CM | POA: Diagnosis not present

## 2023-06-23 DIAGNOSIS — F1010 Alcohol abuse, uncomplicated: Secondary | ICD-10-CM | POA: Diagnosis not present

## 2023-06-23 DIAGNOSIS — L409 Psoriasis, unspecified: Secondary | ICD-10-CM | POA: Diagnosis not present

## 2023-06-23 DIAGNOSIS — R296 Repeated falls: Secondary | ICD-10-CM | POA: Diagnosis not present

## 2023-06-23 DIAGNOSIS — G894 Chronic pain syndrome: Secondary | ICD-10-CM | POA: Diagnosis not present

## 2023-06-23 DIAGNOSIS — M25531 Pain in right wrist: Secondary | ICD-10-CM | POA: Diagnosis not present

## 2023-06-23 DIAGNOSIS — M65311 Trigger thumb, right thumb: Secondary | ICD-10-CM | POA: Diagnosis not present

## 2023-06-23 DIAGNOSIS — M5459 Other low back pain: Secondary | ICD-10-CM | POA: Diagnosis not present

## 2023-06-24 ENCOUNTER — Other Ambulatory Visit: Payer: Self-pay | Admitting: Family Medicine

## 2023-06-24 MED ORDER — HYDROCHLOROTHIAZIDE 25 MG PO TABS: 25.0000 mg | ORAL_TABLET | Freq: Every day | ORAL | 1 refills | Status: DC

## 2023-06-24 NOTE — Addendum Note (Signed)
Addended by: Shon Millet on: 06/24/2023 04:49 PM   Modules accepted: Orders

## 2023-06-25 ENCOUNTER — Other Ambulatory Visit: Payer: Self-pay | Admitting: Family Medicine

## 2023-06-25 ENCOUNTER — Other Ambulatory Visit: Payer: Self-pay | Admitting: Internal Medicine

## 2023-06-26 ENCOUNTER — Other Ambulatory Visit: Payer: Self-pay | Admitting: Internal Medicine

## 2023-06-27 ENCOUNTER — Encounter: Payer: Self-pay | Admitting: Emergency Medicine

## 2023-06-27 ENCOUNTER — Emergency Department: Payer: BC Managed Care – PPO

## 2023-06-27 ENCOUNTER — Emergency Department
Admission: EM | Admit: 2023-06-27 | Discharge: 2023-06-27 | Disposition: A | Discharge status: Home / Self Care | Payer: BC Managed Care – PPO | Attending: Emergency Medicine | Admitting: Emergency Medicine

## 2023-06-27 ENCOUNTER — Other Ambulatory Visit: Payer: Self-pay

## 2023-06-27 DIAGNOSIS — I10 Essential (primary) hypertension: Secondary | ICD-10-CM | POA: Diagnosis not present

## 2023-06-27 DIAGNOSIS — J45901 Unspecified asthma with (acute) exacerbation: Secondary | ICD-10-CM | POA: Insufficient documentation

## 2023-06-27 DIAGNOSIS — J45909 Unspecified asthma, uncomplicated: Secondary | ICD-10-CM | POA: Diagnosis not present

## 2023-06-27 DIAGNOSIS — R0602 Shortness of breath: Secondary | ICD-10-CM | POA: Diagnosis not present

## 2023-06-27 DIAGNOSIS — R9389 Abnormal findings on diagnostic imaging of other specified body structures: Secondary | ICD-10-CM | POA: Diagnosis not present

## 2023-06-27 LAB — BASIC METABOLIC PANEL
Anion gap: 9 (ref 5–15)
BUN: 17 mg/dL (ref 6–20)
CO2: 27 mmol/L (ref 22–32)
Calcium: 8.7 mg/dL — ABNORMAL LOW (ref 8.9–10.3)
Chloride: 97 mmol/L — ABNORMAL LOW (ref 98–111)
Creatinine, Ser: 0.95 mg/dL (ref 0.44–1.00)
GFR, Estimated: >60 mL/min (ref >60)
Glucose, Bld: 207 mg/dL — ABNORMAL HIGH (ref 70–99)
Potassium: 3.5 mmol/L (ref 3.5–5.1)
Sodium: 133 mmol/L — ABNORMAL LOW (ref 135–145)

## 2023-06-27 LAB — CBC
HCT: 42.3 % (ref 36.0–46.0)
Hemoglobin: 13.9 g/dL (ref 12.0–15.0)
MCH: 27.6 pg (ref 26.0–34.0)
MCHC: 32.9 g/dL (ref 30.0–36.0)
MCV: 84.1 fL (ref 80.0–100.0)
Platelets: 310 10*3/uL (ref 150–400)
RBC: 5.03 MIL/uL (ref 3.87–5.11)
RDW: 13.4 % (ref 11.5–15.5)
WBC: 10.5 10*3/uL (ref 4.0–10.5)
nRBC: 0 % (ref 0.0–0.2)

## 2023-06-27 LAB — TROPONIN I (HIGH SENSITIVITY): Troponin I (High Sensitivity): 4 ng/L (ref <18)

## 2023-06-27 MED ORDER — IPRATROPIUM-ALBUTEROL 0.5-2.5 (3) MG/3ML IN SOLN
3.0000 mL | Freq: Once | RESPIRATORY_TRACT | Status: AC
  Administered 2023-06-27: 3 mL via RESPIRATORY_TRACT
  Filled 2023-06-27: qty 3

## 2023-06-27 MED ORDER — PREDNISONE 5 MG PO TABS: ORAL_TABLET | ORAL | 0 refills | Status: AC

## 2023-06-27 NOTE — Discharge Instructions (Signed)
You were seen in ER today for evaluation of your shortness of breath.  Your testing was fortunately overall reassuring.  I suspect this may be related to an asthma flare.  I have sent a course for a prednisone taper to your pharmacy.  Please also start taking the Symbicort that was sent by your primary care doctor.  Follow-up with your primary care doctor for further evaluation.  Return to the ER for new or worsening symptoms.

## 2023-06-27 NOTE — ED Triage Notes (Addendum)
Pt arrived via POV with reports of shortness of breath x 3 months, pt states she been to 2 different hospitals for the same, pt states she is not sure what is causing her shortness of breath.  Pt states she is having shortness of breath with activity as well as wheezing.  Pt reports using inhaler and nebs.  Pt reports when she finishes prednisone her sxs return.

## 2023-06-27 NOTE — ED Provider Notes (Signed)
[facility] Provider Note    Event Date/Time   First MD Initiated Contact with Patient 06/27/23 1234     (approximate)   History   Shortness of Breath   HPI  Alexa Woods is a 63 year old female with history of asthma, hypertension presenting to the emergency department for evaluation of shortness of breath.  Patient reports that she recently had increased shortness of breath and was placed on steroids by her primary care doctor.  She felt better she was taking the steroids, but recently completed them and has had worsening shortness of breath.  Does feel like her asthma.  Has been using a rescue inhaler frequently.  Does not take any daily controlling medication.  No fevers or chills.  Reports she has had some ongoing shortness of breath for several months.  I reviewed her discharge summary from 04/13/2023.  At the time, patient presented with shortness of breath and chest pain.  Admitted for ACS rule out with negative troponin, normal echo, negative nuclear stress test, negative CTA.  I additionally reviewed her outpatient primary care visit from 06/21/2023.  At that time, patient was felt to have asthma exacerbation, primary care doctor did recommend initiation of daily controlling medicine with Symbicort and was also placed on 5-day prednisone course of 20 mg.   Physical Exam   Triage Vital Signs: ED Triage Vitals  Encounter Vitals Group     BP 06/27/23 1044 98/73     Systolic BP Percentile --      Diastolic BP Percentile --      Pulse Rate 06/27/23 1044 75     Resp 06/27/23 1044 20     Temp 06/27/23 1044 97.7 F (36.5 C)     Temp Source 06/27/23 1044 Oral     SpO2 06/27/23 1044 95 %     Weight 06/27/23 1043 243 lb (110.2 kg)     Height 06/27/23 1043 5\' 5"  (1.651 m)     Head Circumference --      Peak Flow --      Pain Score 06/27/23 1042 4     Pain Loc --      Pain Education --      Exclude from Growth Chart --     Most recent vital  signs: Vitals:   06/27/23 1044  BP: 98/73  Pulse: 75  Resp: 20  Temp: 97.7 F (36.5 C)  SpO2: 95%     General: Awake, interactive  CV:  Regular rate, good peripheral perfusion.  Resp:  Unlabored respirations, lungs clear to auscultation with fair air movement without wheezing Abd:  Nondistended.  Neuro:  Symmetric facial movement, fluid speech   ED Results / Procedures / Treatments   Labs (all labs ordered are listed, but only abnormal results are displayed) Labs Reviewed  BASIC METABOLIC PANEL - Abnormal; Notable for the following components:      Result Value   Sodium 133 (*)    Chloride 97 (*)    Glucose, Bld 207 (*)    Calcium 8.7 (*)    All other components within normal limits  CBC  TROPONIN I (HIGH SENSITIVITY)     EKG EKG independently reviewed interpreted by myself (ER attending) demonstrates:    RADIOLOGY Imaging independently reviewed and interpreted by myself demonstrates:  CXR without focal consolidation  PROCEDURES:  Critical Care performed: No  Procedures   MEDICATIONS ORDERED IN ED: Medications  ipratropium-albuterol (DUONEB) 0.5-2.5 (3) MG/3ML nebulizer solution 3 mL (3 mLs  Nebulization Given 06/27/23 1323)     IMPRESSION / MDM / ASSESSMENT AND PLAN / ED COURSE  I reviewed the triage vital signs and the nursing notes.  Differential diagnosis includes, but is not limited to, asthma flare, pneumonia, much lower suspicion ACS, anemia, electrolyte abnormality  Patient's presentation is most consistent with acute presentation with potential threat to life or bodily function.  63 year old female presenting with shortness of breath.  Vital stable on presentation.  Labs without critical derangements.  X-[name] fortunately without evidence of pneumonia.  No significant wheezing here, but clinical history does seem most consistent with asthma flare, possibly related to discontinuation of steroids without taper.  She was given a DuoNeb treatment here  with some improvement. Has a history of diabetes, so will hold off on increasing the dose of her steroids, but will plan for additional course with taper.  Discussed with patient who is comfortable with this plan.  I also discussed that a prescription for Symbicort has been sent to her pharmacy already and I did recommend starting this.  She was unaware that she had this prescription available and is agreeable with this plan.  Strict return precautions provided.  Patient discharged in stable condition.     FINAL CLINICAL IMPRESSION(S) / ED DIAGNOSES   Final diagnoses:  Exacerbation of asthma, unspecified asthma severity, unspecified whether persistent     Rx / DC Orders   ED Discharge Orders          Ordered    predniSONE (DELTASONE) 5 MG tablet  Q breakfast        06/27/23 1351             Note:  This document was prepared using Dragon voice recognition software and may include unintentional dictation errors.   Trinna Post, MD 06/27/23 1352

## 2023-06-28 ENCOUNTER — Telehealth: Payer: Self-pay

## 2023-06-28 NOTE — Telephone Encounter (Signed)
Per chart review tab pt was seen at [facility] ED 06/27/23; sending note to Dr Milinda Antis.

## 2023-06-28 NOTE — Telephone Encounter (Addendum)
Marland Kitchen

## 2023-06-29 DIAGNOSIS — F3181 Bipolar II disorder: Secondary | ICD-10-CM | POA: Diagnosis not present

## 2023-06-29 DIAGNOSIS — J4540 Moderate persistent asthma, uncomplicated: Secondary | ICD-10-CM

## 2023-06-29 DIAGNOSIS — R052 Subacute cough: Secondary | ICD-10-CM

## 2023-06-29 DIAGNOSIS — R0602 Shortness of breath: Secondary | ICD-10-CM

## 2023-07-07 ENCOUNTER — Encounter: Payer: Self-pay | Admitting: Family Medicine

## 2023-07-07 ENCOUNTER — Ambulatory Visit (INDEPENDENT_AMBULATORY_CARE_PROVIDER_SITE_OTHER): Payer: Medicare HMO | Admitting: Family Medicine

## 2023-07-07 VITALS — BP 106/70 | HR 79 | Temp 97.9°F | Ht 65.0 in | Wt 243.1 lb

## 2023-07-07 DIAGNOSIS — J301 Allergic rhinitis due to pollen: Secondary | ICD-10-CM | POA: Diagnosis not present

## 2023-07-07 DIAGNOSIS — K219 Gastro-esophageal reflux disease without esophagitis: Secondary | ICD-10-CM | POA: Diagnosis not present

## 2023-07-07 DIAGNOSIS — J4540 Moderate persistent asthma, uncomplicated: Secondary | ICD-10-CM

## 2023-07-07 DIAGNOSIS — F418 Other specified anxiety disorders: Secondary | ICD-10-CM

## 2023-07-07 NOTE — Assessment & Plan Note (Signed)
 Ongoing  May trigger asthma  Encouraged pt to take her protonix  40 mg first thing in am 30 min before food/meds/vit   Avoid triggers Keep working on weight loss  Handout given

## 2023-07-07 NOTE — Progress Notes (Signed)
 Subjective:    Patient ID: Alexa Woods, female    DOB: 11/24/1960, 63 y.o.   MRN: [id number]  HPI  Wt Readings from Last 3 Encounters:  07/07/23 243 lb 2 oz (110.3 kg)  06/27/23 243 lb (110.2 kg)  06/21/23 243 lb (110.2 kg)   40.46 kg/m  Vitals:   07/07/23 1536  BP: 106/70  Pulse: 79  Temp: 97.9 F (36.6 C)  SpO2: 95%    Pt presents for follow up of ED visit for shortness of breath/ asthma  Seen in ER 1/26   Prior to that we saw her for bronchitis (1/20)  At that time we prescribed prednisone  , tessalon   and symbicort   With albuterol   prn   Echo normal 04/2023  Myo perf scan normal 04/2023 also   DG Chest 2 View Result Date: 06/27/2023 CLINICAL DATA:  Shortness of breath.  Asthma. EXAM: CHEST - 2 VIEW COMPARISON:  06/21/2023 and 04/12/2023 FINDINGS: The heart size and mediastinal contours are within normal limits. Stable elevation of right hemidiaphragm and right basilar scarring. No evidence of pulmonary infiltrate or pleural effusion. IMPRESSION: Stable right basilar scarring and elevated right hemidiaphragm. No active cardiopulmonary disease. Electronically Signed   By: Norleen DELENA Kil M.D.   On: 06/27/2023 11:34   DG Chest 2 View Result Date: 06/21/2023 CLINICAL DATA:  Dry cough for the past 2 months. EXAM: CHEST - 2 VIEW COMPARISON:  04/12/2023 FINDINGS: Stable mild elevation of the right hemidiaphragm and linear scarring/chronic atelectasis in the right middle lobe. The remainder of the lungs remain clear. Normal-sized heart. Mild-to-moderate peribronchial thickening. Unremarkable bones. IMPRESSION: Mild-to-moderate bronchitic changes. Electronically Signed   By: Elspeth Bathe M.D.   On: 06/21/2023 16:35   ER visit noted no significant wheezing and pulse ox was 95% on RA No changes on cxr  Was given duo neb with some improvement  Additional course of prednisone  with taper  They recommended she start the symbicort    Lab Results  Component Value Date   NA 133 (L)  06/27/2023   K 3.5 06/27/2023   CO2 27 06/27/2023   GLUCOSE 207 (H) 06/27/2023   BUN 17 06/27/2023   CREATININE 0.95 06/27/2023   CALCIUM  8.7 (L) 06/27/2023   GFR 54.18 (L) 04/21/2023   GFRNONAA >60 06/27/2023    Today is improved but not 100% Started the symbicort  after ER visit  Finished the prednisone    Less wheezing  Exertion is her trigger   She has allergies also  Runny nose and sneezing      Patient Active Problem List   Diagnosis Date Noted   CAD (coronary artery disease) 04/21/2023   Chest pain 04/13/2023   Right shoulder injury 03/16/2023   Pedal edema 01/01/2023   Frequent falls 08/14/2022   Right-sided chest wall pain 08/14/2022   Statin myopathy 07/29/2022   Itching 07/29/2022   Aortic atherosclerosis (HCC) 07/29/2022   Chronic pain 02/10/2022   Transportation insecurity 05/09/2021   Financial insecurity 05/09/2021   Colon cancer screening 11/08/2020   Current use of proton pump inhibitor 11/04/2020   History of total knee replacement, right 10/21/2020   Trigger finger of right hand 12/20/2019   Dry eyes 09/05/2019   Intractable headache 08/25/2019   Hypothyroidism 04/04/2019   Screening mammogram, encounter for 04/03/2019   Routine general medical examination at a health care facility 04/03/2019   Encounter for screening for HIV 04/03/2019   Vitamin D  deficiency 04/03/2019   Intertrigo 02/21/2019   Chronic constipation 12/26/2017  History of left knee replacement 12/26/2017   Primary osteoarthritis involving multiple joints 12/13/2017   Primary osteoarthritis of left knee 12/06/2017   Chronic neck pain 08/13/2017   Pain in left knee 06/22/2017   Morbid obesity (HCC) 01/21/2017   Joint swelling 12/20/2013   Periodontal disease 02/03/2013   Hyperlipidemia associated with type 2 diabetes mellitus (HCC) 09/27/2012   Cough 03/04/2011   Low back pain 07/22/2010   Hypokalemia 12/12/2008   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   Type 2 diabetes  mellitus with peripheral neuropathy (HCC) 11/30/2006   History of alcohol  abuse 11/30/2006   Depression with anxiety 11/30/2006   Essential hypertension 11/30/2006   Hemorrhoids 11/30/2006   Allergic rhinitis 11/30/2006   Moderate asthma 11/30/2006   GERD without esophagitis 11/30/2006   Psoriasis 11/30/2006   Past Medical History:  Diagnosis Date   Alcohol  abuse, in remission    since 1998   Allergic rhinitis    Anemia    Anxiety    Asthma    Bilateral lower extremity edema    Bulging lumbar disc    Bulging of cervical intervertebral disc    Chronic constipation    DDD (degenerative disc disease), lumbosacral    Depression    Dyspnea    Gastroparesis    GERD (gastroesophageal reflux disease)    Hemorrhoids    History of Bell's palsy 08/2007   left   History of chronic cystitis    IBS (irritable bowel syndrome)    Insulin  dependent type 2 diabetes mellitus [facility])    endocrinologist-- dr trixie   Lower urinary tract symptoms (LUTS)    OA (osteoarthritis)    knees, back, hands, elbows   OSA (obstructive sleep apnea)    per study 06-08-2017 mild osa , cpap recommended , per pt insurance issue   Peripheral neuropathy    PONV (postoperative nausea and vomiting)    Psoriasis    S/P dilatation of esophageal stricture    Unspecified essential hypertension    Wears partial dentures    lower   Past Surgical History:  Procedure Laterality Date   CHOLECYSTECTOMY OPEN  1990   AND APPENDECTOMY   DOBUTAMINE  STRESS ECHO  2009    normal stress echo, no evidence of ischemia   ELBOW SURGERY Bilateral RIGHT 2013;  LEFT 2016   for nerve damage   ESOPHAGOGASTRODUODENOSCOPY  07/2002   erythematous gastropathy   eye sugery Bilateral    KNEE ARTHROSCOPY Left 11/ 2018   dr melodi  @ [facility]   KNEE ARTHROSCOPY W/ PARTIAL MEDIAL MENISCECTOMY Right 10/2008   and chondroplasty   SHOULDER ARTHROSCOPY WITH DISTAL CLAVICLE RESECTION Left 12/ 2011   dr dean   TOTAL KNEE ARTHROPLASTY Right  07-01-2009  dr melodi   [facility]   TOTAL KNEE ARTHROPLASTY Left 12/06/2017   Procedure: LEFT TOTAL LEFT KNEE ARTHROPLASTY;  Surgeon: Melodi Lerner, MD;  Location: [facility];  Service: Orthopedics;  Laterality: Left;   Trigger finger surgery Left 07/2022   VAGINAL HYSTERECTOMY  2003   Social History   Tobacco Use   Smoking status: Former    Current packs/day: 0.00    Types: Cigarettes    Start date: 12/30/1976    Quit date: 12/30/2001    Years since quitting: 21.5   Smokeless tobacco: Never  Vaping Use   Vaping status: Never Used  Substance Use Topics   Alcohol  use: No    Alcohol /week: 0.0 standard drinks of alcohol     Comment: hx alcoholism--  stopped 1998  Drug use: No   Family History  Problem Relation Age of Onset   Alcohol  abuse Mother    Hypertension Mother    Diabetes Mother    Heart failure Mother    Diabetes Sister    Heart attack Sister    Diabetes Sister    Diabetes Sister    Diabetes Sister    Celiac disease Daughter    Esophageal cancer Maternal Grandmother    Diabetes Brother    Parkinson's disease Brother    Heart attack Brother    Heart attack Brother 28   Diabetes Brother    Diabetes Brother    Diabetes Brother    Thyroid  disease Niece    Lung cancer Other        mat great uncle   Colon cancer Neg Hx    Breast cancer Neg Hx    Allergies  Allergen Reactions   Bupropion Hcl Other (See Comments)    Pt is unsure   Cefuroxime Axetil Nausea Only   Crestor  [Rosuvastatin  Calcium ]     Pt states it makes her feel weird    Gabapentin  Other (See Comments)    Pt does not remember reaction  Pt does not remember reaction    Lidocaine Other (See Comments)    REACTION: unknown REACTION: unknown   Metformin Other (See Comments)    REACTION: GI REACTION: GI   Other Other (See Comments)   Paroxetine Other (See Comments)    REACTION: doesn't agree REACTION: doesn't agree   Propoxyphene Other (See Comments)    wheezing wheezing   Tramadol Other (See  Comments)    REACTION: Causes Anxiety   Tramadol Hcl     REACTION: Causes Anxiety   Wellbutrin [Bupropion] Other (See Comments)    Pt is unsure   Codeine Nausea And Vomiting and Rash   Sulfa Antibiotics Rash and Other (See Comments)   Current Outpatient Medications on File Prior to Visit  Medication Sig Dispense Refill   albuterol  (VENTOLIN  HFA) 108 (90 Base) MCG/ACT inhaler INHALE 2 PUFFS INTO THE LUNGS EVERY 4 HOURS AS NEEDED FOR WHEEZING OR SHORTNESS OF BREATH. 18 each 2   B-D UF III MINI PEN NEEDLES 31G X 5 MM MISC USE AS INSTRUCTED FOR 4X DAILY INJECTIONS 400 each 1   BD INSULIN  SYRINGE U/F 31G X 5/16 0.5 ML MISC USE THREE TIMES PER DAY WITH R INSULIN  & ONCE DAILY WITH LEVEMIR  400 each 2   benzonatate  (TESSALON ) 200 MG capsule Take 1 capsule (200 mg total) by mouth 3 (three) times daily as needed for cough. Swallow whole 30 capsule 1   budesonide -formoterol  (SYMBICORT ) 80-4.5 MCG/ACT inhaler Inhale 2 puffs into the lungs 2 (two) times daily. 1 each 3   cholecalciferol (VITAMIN D3) 25 MCG (1000 UNIT) tablet Take 2,000 Units by mouth daily.     dapagliflozin  propanediol (FARXIGA ) 10 MG TABS tablet Take 1 tablet (10 mg total) by mouth daily before breakfast. (Patient taking differently: Take 5 mg by mouth daily before breakfast.) 90 tablet 3   docusate sodium  (COLACE) 100 MG capsule Take 100 mg by mouth every morning.     ezetimibe  (ZETIA ) 10 MG tablet TAKE 1 TABLET BY MOUTH EVERY DAY 90 tablet 1   fluticasone  (FLONASE ) 50 MCG/ACT nasal spray PLACE 2 SPRAYS INTO BOTH NOSTRILS DAILY AS NEEDED FOR ALLERGIES OR RHINITIS. 48 mL 1   glucose blood (ACCU-CHEK GUIDE TEST) test strip Use to check blood sugar 4-5 times a day. 500 each 3   hydrochlorothiazide  (  HYDRODIURIL ) 25 MG tablet Take 1 tablet (25 mg total) by mouth daily. 90 tablet 1   insulin  glargine (LANTUS  SOLOSTAR) 100 UNIT/ML Solostar Pen INJECT 30-35 UNITS INTO THE SKIN DAILY. 30 mL 3   Insulin  Pen Needle 32G X 4 MM MISC Use 4x a day  300 each 3   Insulin  Regular Human (NOVOLIN  R FLEXPEN RELION) 100 UNIT/ML KwikPen Inject 18-25 units under skin before each meal 45 mL 3   ketoconazole  (NIZORAL ) 2 % cream Apply 1 Application topically daily as needed for irritation. 30 g 1   KLOR-CON  M10 10 MEQ tablet TAKE 1 TABLET BY MOUTH EVERY DAY 90 tablet 1   Lancets (ONETOUCH ULTRASOFT) lancets Use to test blood sugar 4 times daily as instructed. 200 each 11   levothyroxine  (SYNTHROID ) 50 MCG tablet Take 1 tablet (50 mcg total) by mouth daily before breakfast. 90 tablet 2   LORazepam  (ATIVAN ) 2 MG tablet Take 2 mg by mouth 2 (two) times daily.     naloxone (NARCAN) 4 MG/0.1ML LIQD nasal spray kit      oxyCODONE -acetaminophen  (PERCOCET) 10-325 MG tablet Take 1 tablet by mouth every 4 (four) hours as needed for pain. Hold for SBP < = 105 12 tablet 0   pantoprazole  (PROTONIX ) 40 MG tablet Take 40 mg by mouth daily. In am 30 min before food or medication or vitamins     RESTASIS  0.05 % ophthalmic emulsion Place 1 drop into both eyes 2 (two) times daily.     sertraline  (ZOLOFT ) 100 MG tablet Take 200 mg by mouth every morning.     tiZANidine  (ZANAFLEX ) 4 MG tablet Take 4 mg by mouth 2 (two) times daily as needed for muscle spasms.      zolpidem  (AMBIEN ) 10 MG tablet Take 10 mg by mouth at bedtime as needed for sleep.     No current facility-administered medications on file prior to visit.    Review of Systems  Constitutional:  Positive for fatigue. Negative for activity change, appetite change, fever and unexpected weight change.  HENT:  Negative for congestion, ear pain, rhinorrhea, sinus pressure and sore throat.   Eyes:  Negative for pain, redness and visual disturbance.  Respiratory:  Positive for shortness of breath and wheezing. Negative for cough and stridor.        Shortness of breath and wheezing are improved but not gone   Cardiovascular:  Negative for chest pain and palpitations.  Gastrointestinal:  Negative for abdominal pain,  blood in stool, constipation and diarrhea.       Some heartburn  Forgets ppi sometimes   Chronic abd discomfort-no change   Endocrine: Negative for polydipsia and polyuria.  Genitourinary:  Negative for dysuria, frequency and urgency.  Musculoskeletal:  Negative for arthralgias, back pain and myalgias.  Skin:  Negative for pallor and rash.  Allergic/Immunologic: Negative for environmental allergies.  Neurological:  Negative for dizziness, syncope and headaches.  Hematological:  Negative for adenopathy. Does not bruise/bleed easily.  Psychiatric/Behavioral:  Positive for dysphoric mood. Negative for decreased concentration. The patient is not nervous/anxious.        Home situation is not good  No physical abuse but no support        Objective:   Physical Exam Constitutional:      General: She is not in acute distress.    Appearance: Normal appearance. She is well-developed. She is obese. She is not ill-appearing or diaphoretic.  HENT:     Head: Normocephalic and atraumatic.  Right Ear: Tympanic membrane and ear canal normal.     Left Ear: Tympanic membrane and ear canal normal.     Nose: Rhinorrhea present.     Mouth/Throat:     Mouth: Mucous membranes are moist.     Pharynx: Oropharynx is clear. No posterior oropharyngeal erythema.  Eyes:     General:        Right eye: No discharge.        Left eye: No discharge.     Conjunctiva/sclera: Conjunctivae normal.     Pupils: Pupils are equal, round, and reactive to light.  Neck:     Thyroid : No thyromegaly.     Vascular: No carotid bruit or JVD.  Cardiovascular:     Rate and Rhythm: Normal rate and regular rhythm.     Heart sounds: Normal heart sounds.     No gallop.  Pulmonary:     Effort: Pulmonary effort is normal. No respiratory distress.     Breath sounds: No stridor. Wheezing present. No rhonchi or rales.     Comments: Wheeze occurs only on forced and extended expiration  No rales or rhonchi  Good air exch Chest:      Chest wall: No tenderness.  Abdominal:     General: There is no distension or abdominal bruit.     Palpations: Abdomen is soft.  Musculoskeletal:     Cervical back: Normal range of motion and neck supple.     Right lower leg: No edema.     Left lower leg: No edema.  Lymphadenopathy:     Cervical: No cervical adenopathy.  Skin:    General: Skin is warm and dry.     Coloration: Skin is not pale.     Findings: No rash.     Comments: Erythema under breasts is improved   Neurological:     Mental Status: She is alert.     Coordination: Coordination normal.     Deep Tendon Reflexes: Reflexes are normal and symmetric. Reflexes normal.  Psychiatric:        Mood and Affect: Mood normal.           Assessment & Plan:   Problem List Items Addressed This Visit       Respiratory   Moderate asthma - Primary   Some improvement after 2 rounds of prednisone  and now symbicort   Seen in ER on 1/26 Reviewed hospital records, lab results and studies in detail   Reassuring eval and cxr   Much improvement on today's exam Recommend continuing symbicort  80-4.5 2 puff bid with albuterol  mri prn  Avoid triggers if possible  Low threshold for pulmonary referral (last seen 2019) if no continued improvement  Stressed importance of controlling GERD as well        Allergic rhinitis   Discussed options for sneezing and rhinorrhea  Given options for over the counter antihistamines that are less sedating than benadryl   Continue flonase          Digestive   GERD without esophagitis   Ongoing  May trigger asthma  Encouraged pt to take her protonix  40 mg first thing in am 30 min before food/meds/vit   Avoid triggers Keep working on weight loss  Handout given       Relevant Medications   pantoprazole  (PROTONIX ) 40 MG tablet     Other   Depression with anxiety   Continues psychiatric care  Zoloft  200 mg daily  Ambien  prn  Stressors continue to be high  May contribute to physical  symptoms at times

## 2023-07-07 NOTE — Assessment & Plan Note (Signed)
 Some improvement after 2 rounds of prednisone  and now symbicort   Seen in ER on 1/26 Reviewed hospital records, lab results and studies in detail   Reassuring eval and cxr   Much improvement on today's exam Recommend continuing symbicort  80-4.5 2 puff bid with albuterol  mri prn  Avoid triggers if possible  Low threshold for pulmonary referral (last seen 2019) if no continued improvement  Stressed importance of controlling GERD as well

## 2023-07-07 NOTE — Assessment & Plan Note (Signed)
Discussed options for sneezing and rhinorrhea  Given options for over the counter antihistamines that are less sedating than benadryl  Continue flonase

## 2023-07-07 NOTE — Patient Instructions (Addendum)
 For allergies you can try claritin or allegra or zyrtec  over the counter (is is less sedating than benadryl  and longer acting)- that is for nasal symptoms  Flonase  nasal spray helps also   I'm glad the symbicort  inhaler helps Continue it as directed  I think the asthma will continue to improve    Controlling acid reflux is important for asthma control  Take your generic protonix  40 mg each morning at least 30 minutes before food/ drink /vitamins   Also avoid your acid reflux triggers    Try and eat better in general / for health and diabetes and weight loss   Try to get most of your carbohydrates from produce (with the exception of white potatoes) and whole grains Eat less bread/pasta/rice/snack foods/cereals/sweets and other items from the middle of the grocery store (processed carbs)  Eat lean protein   You will have to fix dinner separate from your husband   Exercise when /if you can   If asthma worsens I will refer you to a lung doctor - let us  know

## 2023-07-07 NOTE — Assessment & Plan Note (Signed)
 Continues psychiatric care  Zoloft  200 mg daily  Ambien  prn  Stressors continue to be high   May contribute to physical symptoms at times

## 2023-07-19 ENCOUNTER — Ambulatory Visit: Payer: Self-pay | Admitting: Family Medicine

## 2023-07-19 DIAGNOSIS — R0602 Shortness of breath: Secondary | ICD-10-CM | POA: Insufficient documentation

## 2023-07-19 NOTE — Telephone Encounter (Signed)
hief Complaint: Epigastric pain Symptoms: Epigastric pain worse after eating Frequency: Intermittent for months Pertinent Negatives: Patient denies n/a Disposition: [] ED /[] Urgent Care (no appt availability in office) / [x] Appointment(In office/virtual)/ []  [place] [facility]/ [] Home Care/ [] Refused Recommended Disposition /[] [place] [facility]/ []  Follow-up with PCP Additional Notes: Patient called in to schedule an appt with PCP to discuss epigastric pain she has been experiencing over the past few months. Patient states she has been to the hospital 3 times in the last few months, and received a cardiac work up at [facility] and was told everything looked good. Patient reports pain is worse after eating, rating it a 7/10 when it occurs. Patient states she also would like to discuss with her PCP about her asthma. Patient appt made for Wednesday afternoon for further evaluation.   Reason for Disposition  Abdominal pains regularly occur about 1 hour after meals  Answer Assessment - Initial Assessment Questions 1. LOCATION: "Where does it hurt?"      In middle of breasts, at bottom 2. RADIATION: "Does the pain shoot anywhere else?" (e.g., chest, back)     Localized 3. ONSET: "When did the pain begin?" (e.g., minutes, hours or days ago)      A few months 4. SUDDEN: "Gradual or sudden onset?"     Stays for a while 5. PATTERN "Does the pain come and go, or is it constant?"    - If it comes and goes: "How long does it last?" "Do you have pain now?"     (Note: Comes and goes means the pain is intermittent. It goes away completely between bouts.)    - If constant: "Is it getting better, staying the same, or getting worse?"      (Note: Constant means the pain never goes away completely; most serious pain is constant and gets worse.)      Comes and goes 6. SEVERITY: "How bad is the pain?"  (e.g., Scale 1-10; mild, moderate, or severe)    - MILD (1-3): Doesn't interfere with normal  activities, abdomen soft and not tender to touch..     - MODERATE (4-7): Interferes with normal activities or awakens from sleep, abdomen tender to touch.     - SEVERE (8-10): Excruciating pain, doubled over, unable to do any normal activities.       7 7. RECURRENT SYMPTOM: "Have you ever had this type of stomach pain before?" If Yes, ask: "When was the last time?" and "What happened that time?"      Yes 8. AGGRAVATING FACTORS: "Does anything seem to cause this pain?" (e.g., foods, stress, alcohol)     Eating,  9. CARDIAC SYMPTOMS: "Do you have any of the following symptoms: chest pain, difficulty breathing, sweating, nausea?"     No 10. OTHER SYMPTOMS: "Do you have any other symptoms?" (e.g., back pain, diarrhea, fever, urination pain, vomiting)       Difficulty breathing r/t asthma, epigastric pain  Protocols used: Abdominal Pain - Upper-A-AH

## 2023-07-19 NOTE — Telephone Encounter (Signed)
Copied from CRM [id number]. Topic: Referral - Request for Referral >> Jul 19, 2023  4:46 PM Mosetta Putt H wrote: Did the patient discuss referral with their provider in the last year? Yes (If No - schedule appointment) (If Yes - send message)  Appointment offered? No  Type of order/referral and detailed reason for visit: Pulmonary Dr , PCP suggested this referral because patient breathing is not getting better and can't walk far even with inhaler  Preference of office, provider, location: In [place] location  If referral order, have you been seen by this specialty before? Yes (If Yes, this issue or another issue? When? Where? Patient doesn't remember name of office   Can we respond through MyChart? No

## 2023-07-20 ENCOUNTER — Telehealth: Payer: Self-pay | Admitting: *Deleted

## 2023-07-20 NOTE — Telephone Encounter (Signed)
Left VM requesting pt to call the office back. I advised on the message that the office is closed tomorrow so we either need to change her appt to virtual or r/s to next week when we are open.  If pt calls back please update appt with pt's choice

## 2023-07-21 ENCOUNTER — Ambulatory Visit: Payer: Medicare HMO | Admitting: Family Medicine

## 2023-07-22 ENCOUNTER — Ambulatory Visit: Payer: Self-pay | Admitting: Family Medicine

## 2023-07-22 ENCOUNTER — Telehealth: Payer: Self-pay | Admitting: Family Medicine

## 2023-07-22 NOTE — Telephone Encounter (Unsigned)
Copied from CRM [id number]. Topic: General - Other >> Jul 22, 2023  2:03 PM Mosetta Putt H wrote: Reason for CRM: Need form filled out and signed by doctor about health conditions and sent over by fax sent to [facility] fax number  [phone]

## 2023-07-22 NOTE — Telephone Encounter (Signed)
Summary: cough   Copied From CRM [phone]. Reason for Triage: patient has been having a bad cough         Chief Complaint: cough Symptoms: cough Frequency: constant x 4 months Pertinent Negatives: Patient denies fever, pain, sob Disposition: [] ED /[] Urgent Care (no appt availability in office) / [x] Appointment(In office/virtual)/ []  [facility]/ [] Home Care/ [] Refused Recommended Disposition /[] [facility]/ []  Follow-up with PCP Additional Notes: has had a cough x 4 months and has been to the hospital multiple times.  Has apt set for Wednesday with PCP.   Care advice given denies questions, instructed to go to UC if becomes worse.   Reason for Disposition  [1] Known COPD or other severe lung disease (i.e., bronchiectasis, cystic fibrosis, lung surgery) AND [2] worsening symptoms (i.e., increased sputum purulence or amount, increased breathing difficulty  Answer Assessment - Initial Assessment Questions 1. ONSET: "When did the cough begin?"      4 weeks ago 2. SEVERITY: "How bad is the cough today?"      Worse than yesterday. 3. SPUTUM: "Describe the color of your sputum" (none, dry cough; clear, white, yellow, green)     denies 4. HEMOPTYSIS: "Are you coughing up any blood?" If so ask: "How much?" (flecks, streaks, tablespoons, etc.)     denies 5. DIFFICULTY BREATHING: "Are you having difficulty breathing?" If Yes, ask: "How bad is it?" (e.g., mild, moderate, severe)    - MILD: No SOB at rest, mild SOB with walking, speaks normally in sentences, can lie down, no retractions, pulse < 100.    - MODERATE: SOB at rest, SOB with minimal exertion and prefers to sit, cannot lie down flat, speaks in phrases, mild retractions, audible wheezing, pulse 100-120.    - SEVERE: Very SOB at rest, speaks in single words, struggling to breathe, sitting hunched forward, retractions, pulse > 120      At times 6. FEVER: "Do you have a fever?" If Yes, ask: "What is your temperature,  how was it measured, and when did it start?"     Denies.  7. CARDIAC HISTORY: "Do you have any history of heart disease?" (e.g., heart attack, congestive heart failure)      Dm, htn, heart disease 8. LUNG HISTORY: "Do you have any history of lung disease?"  (e.g., pulmonary embolus, asthma, emphysema)     asthma 9. PE RISK FACTORS: "Do you have a history of blood clots?" (or: recent major surgery, recent prolonged travel, bedridden)     denies 10. OTHER SYMPTOMS: "Do you have any other symptoms?" (e.g., runny nose, wheezing, chest pain)       Runny nose, wheezing, was having a lot of chest pain that comes and goes and sometimes with the cough 11. PREGNANCY: "Is there any chance you are pregnant?" "When was your last menstrual period?"       na 12. TRAVEL: "Have you traveled out of the country in the last month?" (e.g., travel history, exposures)       Denies.  Protocols used: Cough - Acute Productive-A-AH

## 2023-07-22 NOTE — Telephone Encounter (Signed)
Will see her then Agree with ER/UC precautions

## 2023-07-23 ENCOUNTER — Telehealth: Payer: Self-pay | Admitting: Family Medicine

## 2023-07-23 NOTE — Telephone Encounter (Signed)
 Form in your inbox

## 2023-07-23 NOTE — Telephone Encounter (Signed)
Patient dropped of forms to be completed by provider ask that forms be faxed to number on form after completed.  Placed in providers box at front office.

## 2023-07-23 NOTE — Telephone Encounter (Signed)
Will be on the lookout for the forms.

## 2023-07-26 NOTE — Telephone Encounter (Signed)
 Done and in IN box

## 2023-07-27 NOTE — Telephone Encounter (Signed)
 Form faxed, will give pt her copy back at appt tomorrow an copy sent to scanning

## 2023-07-28 ENCOUNTER — Ambulatory Visit: Payer: Medicare HMO | Admitting: Family Medicine

## 2023-08-06 ENCOUNTER — Encounter: Payer: Self-pay | Admitting: Family Medicine

## 2023-08-06 ENCOUNTER — Ambulatory Visit (INDEPENDENT_AMBULATORY_CARE_PROVIDER_SITE_OTHER): Payer: BC Managed Care – PPO | Admitting: Family Medicine

## 2023-08-06 VITALS — BP 126/72 | HR 86 | Temp 97.8°F | Wt 239.5 lb

## 2023-08-06 DIAGNOSIS — E785 Hyperlipidemia, unspecified: Secondary | ICD-10-CM | POA: Diagnosis not present

## 2023-08-06 DIAGNOSIS — E1169 Type 2 diabetes mellitus with other specified complication: Secondary | ICD-10-CM

## 2023-08-06 DIAGNOSIS — Z0000 Encounter for general adult medical examination without abnormal findings: Secondary | ICD-10-CM | POA: Diagnosis not present

## 2023-08-06 DIAGNOSIS — Z79899 Other long term (current) drug therapy: Secondary | ICD-10-CM | POA: Diagnosis not present

## 2023-08-06 DIAGNOSIS — E1142 Type 2 diabetes mellitus with diabetic polyneuropathy: Secondary | ICD-10-CM | POA: Diagnosis not present

## 2023-08-06 DIAGNOSIS — Z23 Encounter for immunization: Secondary | ICD-10-CM | POA: Diagnosis not present

## 2023-08-06 DIAGNOSIS — I10 Essential (primary) hypertension: Secondary | ICD-10-CM | POA: Diagnosis not present

## 2023-08-06 DIAGNOSIS — I700 Atherosclerosis of aorta: Secondary | ICD-10-CM

## 2023-08-06 DIAGNOSIS — E039 Hypothyroidism, unspecified: Secondary | ICD-10-CM

## 2023-08-06 DIAGNOSIS — E559 Vitamin D deficiency, unspecified: Secondary | ICD-10-CM

## 2023-08-06 NOTE — Patient Instructions (Addendum)
 Pneumonia shot today  Flu shot today   If you are interested in the shingles vaccine series (Shingrix), call your insurance or pharmacy to check on coverage and location it must be given.  If affordable - you can schedule it here or at your pharmacy depending on coverage   Get the vitamin D in your pill box   Keep walking when you can  Add some strength training to your routine, this is important for bone and brain health and can reduce your risk of falls and help your body use insulin properly and regulate weight  Light weights, exercise bands , and internet videos are a good way to start  Yoga (chair or regular), machines , floor exercises or a gym with machines are also good options    Labs today   Take care of yourself

## 2023-08-06 NOTE — Progress Notes (Signed)
 Subjective:    Patient ID: Alexa Woods, female    DOB: 1961-03-03, 63 y.o.   MRN: [id number]  HPI  Here for health maintenance exam and to review chronic medical problems   Wt Readings from Last 3 Encounters:  08/06/23 239 lb 8 oz (108.6 kg)  07/07/23 243 lb 2 oz (110.3 kg)  06/27/23 243 lb (110.2 kg)   39.85 kg/m  Vitals:   08/06/23 1538  BP: 126/72  Pulse: 86  Temp: 97.8 F (36.6 C)  SpO2: 95%    Immunization History  Administered Date(s) Administered   Influenza Inj Mdck Quad Pf 05/06/2018   Influenza Split 02/20/2011, 04/19/2012   Influenza Whole 04/02/2006, 06/19/2009   Influenza, Seasonal, Injecte, Preservative Fre 08/06/2023   Influenza,inj,Quad PF,6+ Mos 04/02/2014, 08/02/2015, 04/07/2016, 04/05/2017   Influenza,inj,quad, With Preservative 04/01/2017   Influenza-Unspecified 02/07/2019   PNEUMOCOCCAL CONJUGATE-20 08/06/2023   Pneumococcal Polysaccharide-23 06/29/2007, 08/02/2015   Td 02/18/2003   Td (Adult), 2 Lf Tetanus Toxid, Preservative Free 02/18/2003   Tdap 08/22/2018    Health Maintenance Due  Topic Date Due   HEMOGLOBIN A1C  06/06/2023   Medicare Annual Wellness (AWV)  07/24/2023   Pna vaccine 07/2015- will do today  Flu shot -will do today   Went so son's wedding  Got to dance with him  This was fantastic   Shingrix -checking on coverage    Mammogram 09/2022  Self breast exam- no lumps / dense tissue /hard to tell   Gyn health- s/p hysterectomy  No problems    Colon cancer screening  colonoscopy 11/2021 with 10 y recall   Bone health    Falls- multiple / history of clumsiness /last was over 3 months ago  [place] recent but cannot r/o possible toe fracture months ago /dropped something on it  Supplements - cannot remember to take it  Last vitamin D Lab Results  Component Value Date   VD25OH 35 08/06/2023    Exercise  Some walking when she can tolerate  Chronic pain issues limit her  Housework Yard work      Mood    08/06/2023    4:32 PM 01/01/2023    3:45 PM 08/05/2022    3:54 PM 07/29/2022    3:30 PM 07/23/2022    2:40 PM  Depression screen PHQ 2/9  Decreased Interest 1 3 1 1  0  Down, Depressed, Hopeless 2 3 2 3 1   PHQ - 2 Score 3 6 3 4 1   Altered sleeping 2 2 1 1 3   Tired, decreased energy 3 2 1 1 3   Change in appetite 3 2 2 2 3   Feeling bad or failure about yourself  3 3 1 3 3   Trouble concentrating 2 2 1 1 2   Moving slowly or fidgety/restless 1 0 0 0 0  Suicidal thoughts 1 1 0 1 0  PHQ-9 Score 18 18 9 13 15   Difficult doing work/chores Extremely dIfficult Somewhat difficult  Somewhat difficult    Under psychiatry care  Zoloft 200 mg daily -not changed  Ambien prn   HTN bp is stable today  No cp or palpitations or headaches or edema  No side effects to medicines  BP Readings from Last 3 Encounters:  08/06/23 126/72  07/07/23 106/70  06/27/23 98/73    Hydrochlorothiazide 25 mg daily  Klor con   Has had CAD on CTA Declined ref to cardiology in past  Statin intolerant  Takes zetia 10 mg daily   Lab Results  Component Value Date   NA 144 08/06/2023   K 3.8 08/06/2023   CO2 31 08/06/2023   GLUCOSE 82 08/06/2023   BUN 19 08/06/2023   CREATININE 1.03 08/06/2023   CALCIUM 9.9 08/06/2023   GFR 54.18 (L) 04/21/2023   GFRNONAA >60 06/27/2023    Lab Results  Component Value Date   ALT 23 08/06/2023   AST 23 08/06/2023   ALKPHOS 91 04/12/2023   BILITOT 0.4 08/06/2023    DM2 Under care of endocrinologist   Hyperlipidemia Lab Results  Component Value Date   CHOL 182 08/06/2023   HDL 78 08/06/2023   LDLCALC 76 08/06/2023   LDLDIRECT 97.0 11/06/2020   TRIG 182 (H) 08/06/2023   CHOLHDL 2.3 08/06/2023   Hypothyroidism  Pt has no clinical changes No change in energy level/ hair or skin/ edema and no tremor Lab Results  Component Value Date   TSH 5.38 (H) 08/06/2023    Levothyroxine 50 mcg daily   GERD Protonix  Lab Results  Component Value Date    VITAMINB12 504 08/06/2023        Patient Active Problem List   Diagnosis Date Noted   SOB (shortness of breath) on exertion 07/19/2023   CAD (coronary artery disease) 04/21/2023   Chest pain 04/13/2023   Right shoulder injury 03/16/2023   Pedal edema 01/01/2023   Frequent falls 08/14/2022   Statin myopathy 07/29/2022   Itching 07/29/2022   Aortic atherosclerosis (HCC) 07/29/2022   Chronic pain 02/10/2022   Transportation insecurity 05/09/2021   Financial insecurity 05/09/2021   Colon cancer screening 11/08/2020   Current use of proton pump inhibitor 11/04/2020   History of total knee replacement, right 10/21/2020   Trigger finger of right hand 12/20/2019   Dry eyes 09/05/2019   Intractable headache 08/25/2019   Hypothyroidism 04/04/2019   Screening mammogram, encounter for 04/03/2019   Routine general medical examination at a health care facility 04/03/2019   Encounter for screening for HIV 04/03/2019   Vitamin D deficiency 04/03/2019   Intertrigo 02/21/2019   Chronic constipation 12/26/2017   History of left knee replacement 12/26/2017   Primary osteoarthritis involving multiple joints 12/13/2017   Primary osteoarthritis of left knee 12/06/2017   Chronic neck pain 08/13/2017   Pain in left knee 06/22/2017   Morbid obesity (HCC) 01/21/2017   Joint swelling 12/20/2013   Periodontal disease 02/03/2013   Hyperlipidemia associated with type 2 diabetes mellitus (HCC) 09/27/2012   Cough 03/04/2011   Low back pain 07/22/2010   Hypokalemia 12/12/2008   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   Type 2 diabetes mellitus with peripheral neuropathy (HCC) 11/30/2006   History of alcohol abuse 11/30/2006   Depression with anxiety 11/30/2006   Essential hypertension 11/30/2006   Hemorrhoids 11/30/2006   Allergic rhinitis 11/30/2006   Moderate asthma 11/30/2006   GERD without esophagitis 11/30/2006   Psoriasis 11/30/2006   Past Medical History:  Diagnosis Date   Alcohol abuse,  in remission    since 1998   Allergic rhinitis    Anemia    Anxiety    Asthma    Bilateral lower extremity edema    Bulging lumbar disc    Bulging of cervical intervertebral disc    Chronic constipation    DDD (degenerative disc disease), lumbosacral    Depression    Dyspnea    Gastroparesis    GERD (gastroesophageal reflux disease)    Hemorrhoids    History of Bell's palsy 08/2007   left   History of chronic cystitis  IBS (irritable bowel syndrome)    Insulin dependent type 2 diabetes mellitus [facility])    endocrinologist-- dr Elvera Lennox   Lower urinary tract symptoms (LUTS)    OA (osteoarthritis)    knees, back, hands, elbows   OSA (obstructive sleep apnea)    per study 06-08-2017 mild osa , cpap recommended , per pt insurance issue   Peripheral neuropathy    PONV (postoperative nausea and vomiting)    Psoriasis    S/P dilatation of esophageal stricture    Unspecified essential hypertension    Wears partial dentures    lower   Past Surgical History:  Procedure Laterality Date   CHOLECYSTECTOMY OPEN  1990   AND APPENDECTOMY   DOBUTAMINE STRESS ECHO  2009    normal stress echo, no evidence of ischemia   ELBOW SURGERY Bilateral RIGHT 2013;  LEFT 2016   for nerve damage   ESOPHAGOGASTRODUODENOSCOPY  07/2002   erythematous gastropathy   eye sugery Bilateral    KNEE ARTHROSCOPY Left 11/ 2018   dr Lequita Halt  @ [facility]   KNEE ARTHROSCOPY W/ PARTIAL MEDIAL MENISCECTOMY Right 10/2008   and chondroplasty   SHOULDER ARTHROSCOPY WITH DISTAL CLAVICLE RESECTION Left 12/ 2011   dr dean   TOTAL KNEE ARTHROPLASTY Right 07-01-2009  dr Lequita Halt   [facility]   TOTAL KNEE ARTHROPLASTY Left 12/06/2017   Procedure: LEFT TOTAL LEFT KNEE ARTHROPLASTY;  Surgeon: Ollen Gross, MD;  Location: [facility];  Service: Orthopedics;  Laterality: Left;   Trigger finger surgery Left 07/2022   VAGINAL HYSTERECTOMY  2003   Social History   Tobacco Use   Smoking status: Former    Current packs/day: 0.00    Types:  Cigarettes    Start date: 12/30/1976    Quit date: 12/30/2001    Years since quitting: 21.6   Smokeless tobacco: Never  Vaping Use   Vaping status: Never Used  Substance Use Topics   Alcohol use: No    Alcohol/week: 0.0 standard drinks of alcohol    Comment: hx alcoholism--  stopped 1998    Drug use: No   Family History  Problem Relation Age of Onset   Alcohol abuse Mother    Hypertension Mother    Diabetes Mother    Heart failure Mother    Diabetes Sister    Heart attack Sister    Diabetes Sister    Diabetes Sister    Diabetes Sister    Celiac disease Daughter    Esophageal cancer Maternal Grandmother    Diabetes Brother    Parkinson's disease Brother    Heart attack Brother    Heart attack Brother 28   Diabetes Brother    Diabetes Brother    Diabetes Brother    Thyroid disease Niece    Lung cancer Other        mat great uncle   Colon cancer Neg Hx    Breast cancer Neg Hx    Allergies  Allergen Reactions   Bupropion Hcl Other (See Comments)    Pt is unsure   Cefuroxime Axetil Nausea Only   Crestor [Rosuvastatin Calcium]     Pt states it makes her feel weird    Gabapentin Other (See Comments)    Pt does not remember reaction  Pt does not remember reaction    Lidocaine Other (See Comments)    REACTION: unknown REACTION: unknown   Metformin Other (See Comments)    REACTION: GI REACTION: GI   Other Other (See Comments)   Paroxetine Other (  See Comments)    REACTION: doesn't agree REACTION: doesn't agree   Propoxyphene Other (See Comments)    wheezing wheezing   Tramadol Other (See Comments)    REACTION: Causes Anxiety   Tramadol Hcl     REACTION: Causes Anxiety   Wellbutrin [Bupropion] Other (See Comments)    Pt is unsure   Codeine Nausea And Vomiting and Rash   Sulfa Antibiotics Rash and Other (See Comments)   Current Outpatient Medications on File Prior to Visit  Medication Sig Dispense Refill   albuterol (VENTOLIN HFA) 108 (90 Base) MCG/ACT inhaler  INHALE 2 PUFFS INTO THE LUNGS EVERY 4 HOURS AS NEEDED FOR WHEEZING OR SHORTNESS OF BREATH. 18 each 2   B-D UF III MINI PEN NEEDLES 31G X 5 MM MISC USE AS INSTRUCTED FOR 4X DAILY INJECTIONS 400 each 1   BD INSULIN SYRINGE U/F 31G X 5/16" 0.5 ML MISC USE THREE TIMES PER DAY WITH R INSULIN & ONCE DAILY WITH LEVEMIR 400 each 2   benzonatate (TESSALON) 200 MG capsule Take 1 capsule (200 mg total) by mouth 3 (three) times daily as needed for cough. Swallow whole 30 capsule 1   budesonide-formoterol (SYMBICORT) 80-4.5 MCG/ACT inhaler Inhale 2 puffs into the lungs 2 (two) times daily. 1 each 3   cholecalciferol (VITAMIN D3) 25 MCG (1000 UNIT) tablet Take 2,000 Units by mouth daily.     dapagliflozin propanediol (FARXIGA) 10 MG TABS tablet Take 1 tablet (10 mg total) by mouth daily before breakfast. (Patient taking differently: Take 5 mg by mouth daily before breakfast.) 90 tablet 3   docusate sodium (COLACE) 100 MG capsule Take 100 mg by mouth every morning.     ezetimibe (ZETIA) 10 MG tablet TAKE 1 TABLET BY MOUTH EVERY DAY 90 tablet 1   fluticasone (FLONASE) 50 MCG/ACT nasal spray PLACE 2 SPRAYS INTO BOTH NOSTRILS DAILY AS NEEDED FOR ALLERGIES OR RHINITIS. 48 mL 1   glucose blood (ACCU-CHEK GUIDE TEST) test strip Use to check blood sugar 4-5 times a day. 500 each 3   hydrochlorothiazide (HYDRODIURIL) 25 MG tablet Take 1 tablet (25 mg total) by mouth daily. 90 tablet 1   insulin glargine (LANTUS SOLOSTAR) 100 UNIT/ML Solostar Pen INJECT 30-35 UNITS INTO THE SKIN DAILY. 30 mL 3   Insulin Pen Needle 32G X 4 MM MISC Use 4x a day 300 each 3   Insulin Regular Human (NOVOLIN R FLEXPEN RELION) 100 UNIT/ML KwikPen Inject 18-25 units under skin before each meal 45 mL 3   ketoconazole (NIZORAL) 2 % cream Apply 1 Application topically daily as needed for irritation. 30 g 1   KLOR-CON M10 10 MEQ tablet TAKE 1 TABLET BY MOUTH EVERY DAY 90 tablet 1   Lancets (ONETOUCH ULTRASOFT) lancets Use to test blood sugar 4 times  daily as instructed. 200 each 11   levothyroxine (SYNTHROID) 50 MCG tablet Take 1 tablet (50 mcg total) by mouth daily before breakfast. 90 tablet 2   LORazepam (ATIVAN) 2 MG tablet Take 2 mg by mouth 2 (two) times daily.     naloxone (NARCAN) 4 MG/0.1ML LIQD nasal spray kit      oxyCODONE-acetaminophen (PERCOCET) 10-325 MG tablet Take 1 tablet by mouth every 4 (four) hours as needed for pain. Hold for SBP < = 105 12 tablet 0   pantoprazole (PROTONIX) 40 MG tablet Take 40 mg by mouth daily. In am 30 min before food or medication or vitamins     sertraline (ZOLOFT) 100 MG tablet Take  200 mg by mouth every morning.     tiZANidine (ZANAFLEX) 4 MG tablet Take 4 mg by mouth 2 (two) times daily as needed for muscle spasms.      zolpidem (AMBIEN) 10 MG tablet Take 10 mg by mouth at bedtime as needed for sleep.     No current facility-administered medications on file prior to visit.    Review of Systems  Constitutional:  Negative for activity change, appetite change, fatigue, fever and unexpected weight change.       Appetite stays down  HENT:  Negative for congestion, ear pain, rhinorrhea, sinus pressure and sore throat.   Eyes:  Negative for pain, redness and visual disturbance.  Respiratory:  Negative for cough, shortness of breath and wheezing.   Cardiovascular:  Negative for chest pain and palpitations.  Gastrointestinal:  Negative for abdominal pain, blood in stool, constipation and diarrhea.  Endocrine: Negative for polydipsia and polyuria.       Still gets some low glucose   Genitourinary:  Negative for dysuria, frequency and urgency.  Musculoskeletal:  Positive for arthralgias and myalgias. Negative for back pain.  Skin:  Negative for pallor and rash.       Psoriasis   Allergic/Immunologic: Negative for environmental allergies.  Neurological:  Negative for dizziness, syncope and headaches.       Poor balance  Hematological:  Negative for adenopathy. Does not bruise/bleed easily.   Psychiatric/Behavioral:  Negative for decreased concentration and dysphoric mood. The patient is not nervous/anxious.        Objective:   Physical Exam Constitutional:      General: She is not in acute distress.    Appearance: Normal appearance. She is well-developed. She is obese. She is not ill-appearing or diaphoretic.  HENT:     Head: Normocephalic and atraumatic.     Right Ear: Tympanic membrane, ear canal and external ear normal.     Left Ear: Tympanic membrane, ear canal and external ear normal.     Nose: Nose normal. No congestion.     Mouth/Throat:     Mouth: Mucous membranes are moist.     Pharynx: Oropharynx is clear. No posterior oropharyngeal erythema.  Eyes:     General: No scleral icterus.    Extraocular Movements: Extraocular movements intact.     Conjunctiva/sclera: Conjunctivae normal.     Pupils: Pupils are equal, round, and reactive to light.  Neck:     Thyroid: No thyromegaly.     Vascular: No carotid bruit or JVD.  Cardiovascular:     Rate and Rhythm: Normal rate and regular rhythm.     Pulses: Normal pulses.     Heart sounds: Normal heart sounds.     No gallop.  Pulmonary:     Effort: Pulmonary effort is normal. No respiratory distress.     Breath sounds: Normal breath sounds. No wheezing.     Comments: Good air exch Chest:     Chest wall: No tenderness.  Abdominal:     General: Bowel sounds are normal. There is no distension or abdominal bruit.     Palpations: Abdomen is soft. There is no mass.     Tenderness: There is no abdominal tenderness.     Hernia: No hernia is present.  Genitourinary:    Comments: Breast exam: No mass, nodules, thickening, tenderness, bulging, retraction, inflamation, nipple discharge or skin changes noted.  No axillary or clavicular LA.     Musculoskeletal:        General: No tenderness.  Normal range of motion.     Cervical back: Normal range of motion and neck supple. No rigidity. No muscular tenderness.     Right  lower leg: No edema.     Left lower leg: No edema.     Comments: No kyphosis   Lymphadenopathy:     Cervical: No cervical adenopathy.  Skin:    General: Skin is warm and dry.     Coloration: Skin is not pale.     Findings: No erythema or rash.     Comments: Fair  Solar lentigines diffusely   Neurological:     Mental Status: She is alert. Mental status is at baseline.     Cranial Nerves: No cranial nerve deficit.     Motor: No abnormal muscle tone.     Coordination: Coordination normal.     Gait: Gait normal.     Deep Tendon Reflexes: Reflexes are normal and symmetric. Reflexes normal.  Psychiatric:        Mood and Affect: Mood normal.        Cognition and Memory: Cognition and memory normal.           Assessment & Plan:   Problem List Items Addressed This Visit       Cardiovascular and Mediastinum   Essential hypertension   After recemt hosp for non cardiac cp Reviewed hospital records, lab results and studies in detail  bp in fair control at this time  BP Readings from Last 1 Encounters:  08/06/23 126/72   No changes needed but will watch for symptoms of hypotension  Most recent labs reviewed  Disc lifstyle change with low sodium diet and exercise  Stable with hctz 25 mg daily  Sees endo for dm2        Relevant Orders   TSH (Completed)   Lipid panel (Completed)   Comprehensive metabolic panel (Completed)   CBC with Differential/Platelet (Completed)   Aortic atherosclerosis (HCC)   No symptoms  Blood pressure is controlled Lipid panel today        Endocrine   Type 2 diabetes mellitus with peripheral neuropathy (HCC)   Under endo care       Hypothyroidism   TSH today  Levothyroxine 50 mcg daily No clinical changes       Relevant Orders   TSH (Completed)   Hyperlipidemia associated with type 2 diabetes mellitus (HCC)   Disc goals for lipids and reasons to control them Rev last labs with pt Rev low sat fat diet in detail Intolerant of  statins Taking zetia        Relevant Orders   Lipid panel (Completed)   Comprehensive metabolic panel (Completed)     Other   Vitamin D deficiency   D level today  Discussed importance to bone and general health       Routine general medical examination at a health care facility - Primary   Reviewed health habits including diet and exercise and skin cancer prevention Reviewed appropriate screening tests for age  Also reviewed health mt list, fam hx and immunization status , as well as social and family history   See HPI Labs reviewed and ordered Health Maintenance  Topic Date Due   Hemoglobin A1C  06/06/2023   Medicare Annual Wellness Visit  07/24/2023   COVID-19 Vaccine (1) 03/15/2024*   Zoster (Shingles) Vaccine (1 of 2) 10/03/2024*   Eye exam for diabetics  09/10/2023   Complete foot exam   12/04/2023   Yearly kidney  health urinalysis for diabetes  04/08/2024   Yearly kidney function blood test for diabetes  08/05/2024   Mammogram  09/29/2024   DTaP/Tdap/Td vaccine (4 - Td or Tdap) 08/21/2028   Colon Cancer Screening  12/17/2031   Pneumococcal Vaccination  Completed   Flu Shot  Completed   Hepatitis C Screening  Completed   HIV Screening  Completed   HPV Vaccine  Aged Out  *Topic was postponed. The date shown is not the original due date.   Flu and prevnar 20 vaccines today  Plans to get shingrix at [facility] if covered  Past hysterectomy  Discussed fall prevention, supplements and exercise for bone density   Discussed fall prevention  PHQ elevated-under psych care       Morbid obesity (HCC)   Discussed how this problem influences overall health and the risks it imposes  Reviewed plan for weight loss with lower calorie diet (via better food choices (lower glycemic and portion control) along with exercise building up to or more than 30 minutes 5 days per week including some aerobic activity and strength training   Pt is happy with weight loss so far Commended        Current use of proton pump inhibitor   B12 and D levels today        Relevant Orders   VITAMIN D 25 Hydroxy (Vit-D Deficiency, Fractures) (Completed)   Vitamin B12 (Completed)   Other Visit Diagnoses       Need for pneumococcal 20-valent conjugate vaccination       Relevant Orders   Pneumococcal conjugate vaccine 20-valent (Prevnar 20) (Completed)     Need for influenza vaccination       Relevant Orders   Flu vaccine trivalent PF, 6mos and older(Flulaval,Afluria,Fluarix,Fluzone) (Completed)

## 2023-08-07 LAB — COMPREHENSIVE METABOLIC PANEL
AG Ratio: 1.2 (calc) (ref 1.0–2.5)
ALT: 23 U/L (ref 6–29)
AST: 23 U/L (ref 10–35)
Albumin: 4.2 g/dL (ref 3.6–5.1)
Alkaline phosphatase (APISO): 165 U/L — ABNORMAL HIGH (ref 37–153)
BUN: 19 mg/dL (ref 7–25)
CO2: 31 mmol/L (ref 20–32)
Calcium: 9.9 mg/dL (ref 8.6–10.4)
Chloride: 102 mmol/L (ref 98–110)
Creat: 1.03 mg/dL (ref 0.50–1.05)
Globulin: 3.5 g/dL (ref 1.9–3.7)
Glucose, Bld: 82 mg/dL (ref 65–99)
Potassium: 3.8 mmol/L (ref 3.5–5.3)
Sodium: 144 mmol/L (ref 135–146)
Total Bilirubin: 0.4 mg/dL (ref 0.2–1.2)
Total Protein: 7.7 g/dL (ref 6.1–8.1)

## 2023-08-07 LAB — CBC WITH DIFFERENTIAL/PLATELET
Absolute Lymphocytes: 1540 {cells}/uL (ref 850–3900)
Absolute Monocytes: 693 {cells}/uL (ref 200–950)
Basophils Absolute: 62 {cells}/uL (ref 0–200)
Basophils Relative: 0.8 %
Eosinophils Absolute: 231 {cells}/uL (ref 15–500)
Eosinophils Relative: 3 %
HCT: 43.1 % (ref 35.0–45.0)
Hemoglobin: 13.9 g/dL (ref 11.7–15.5)
MCH: 26.7 pg — ABNORMAL LOW (ref 27.0–33.0)
MCHC: 32.3 g/dL (ref 32.0–36.0)
MCV: 82.7 fL (ref 80.0–100.0)
MPV: 10.5 fL (ref 7.5–12.5)
Monocytes Relative: 9 %
Neutro Abs: 5174 {cells}/uL (ref 1500–7800)
Neutrophils Relative %: 67.2 %
Platelets: 277 10*3/uL (ref 140–400)
RBC: 5.21 10*6/uL — ABNORMAL HIGH (ref 3.80–5.10)
RDW: 13.2 % (ref 11.0–15.0)
Total Lymphocyte: 20 %
WBC: 7.7 10*3/uL (ref 3.8–10.8)

## 2023-08-07 LAB — VITAMIN D 25 HYDROXY (VIT D DEFICIENCY, FRACTURES): Vit D, 25-Hydroxy: 35 ng/mL (ref 30–100)

## 2023-08-07 LAB — LIPID PANEL
Cholesterol: 182 mg/dL (ref <200)
HDL: 78 mg/dL (ref >=50)
LDL Cholesterol (Calc): 76 mg/dL
Non-HDL Cholesterol (Calc): 104 mg/dL (ref <130)
Total CHOL/HDL Ratio: 2.3 (calc) (ref <5.0)
Triglycerides: 182 mg/dL — ABNORMAL HIGH (ref <150)

## 2023-08-07 LAB — TSH: TSH: 5.38 m[IU]/L — ABNORMAL HIGH (ref 0.40–4.50)

## 2023-08-07 LAB — VITAMIN B12: Vitamin B-12: 504 pg/mL (ref 200–1100)

## 2023-08-08 ENCOUNTER — Encounter: Payer: Self-pay | Admitting: Family Medicine

## 2023-08-08 NOTE — Assessment & Plan Note (Signed)
 Disc goals for lipids and reasons to control them Rev last labs with pt Rev low sat fat diet in detail Intolerant of statins Taking zetia

## 2023-08-08 NOTE — Assessment & Plan Note (Signed)
 After recemt hosp for non cardiac cp Reviewed hospital records, lab results and studies in detail  bp in fair control at this time  BP Readings from Last 1 Encounters:  08/06/23 126/72   No changes needed but will watch for symptoms of hypotension  Most recent labs reviewed  Disc lifstyle change with low sodium diet and exercise  Stable with hctz 25 mg daily  Sees endo for dm2

## 2023-08-08 NOTE — Assessment & Plan Note (Signed)
 Reviewed health habits including diet and exercise and skin cancer prevention Reviewed appropriate screening tests for age  Also reviewed health mt list, fam hx and immunization status , as well as social and family history   See HPI Labs reviewed and ordered Health Maintenance  Topic Date Due   Hemoglobin A1C  06/06/2023   Medicare Annual Wellness Visit  07/24/2023   COVID-19 Vaccine (1) 03/15/2024*   Zoster (Shingles) Vaccine (1 of 2) 10/03/2024*   Eye exam for diabetics  09/10/2023   Complete foot exam   12/04/2023   Yearly kidney health urinalysis for diabetes  04/08/2024   Yearly kidney function blood test for diabetes  08/05/2024   Mammogram  09/29/2024   DTaP/Tdap/Td vaccine (4 - Td or Tdap) 08/21/2028   Colon Cancer Screening  12/17/2031   Pneumococcal Vaccination  Completed   Flu Shot  Completed   Hepatitis C Screening  Completed   HIV Screening  Completed   HPV Vaccine  Aged Out  *Topic was postponed. The date shown is not the original due date.   Flu and prevnar 20 vaccines today  Plans to get shingrix at [facility] if covered  Past hysterectomy  Discussed fall prevention, supplements and exercise for bone density   Discussed fall prevention  PHQ elevated-under psych care

## 2023-08-08 NOTE — Assessment & Plan Note (Signed)
 D level today  Discussed importance to bone and general health

## 2023-08-08 NOTE — Assessment & Plan Note (Signed)
B12 and D levels today

## 2023-08-08 NOTE — Assessment & Plan Note (Signed)
 TSH today  Levothyroxine 50 mcg daily  No clinical changes

## 2023-08-08 NOTE — Assessment & Plan Note (Signed)
 No symptoms  Blood pressure is controlled Lipid panel today

## 2023-08-08 NOTE — Assessment & Plan Note (Signed)
 Under endo care

## 2023-08-08 NOTE — Assessment & Plan Note (Signed)
 Discussed how this problem influences overall health and the risks it imposes  Reviewed plan for weight loss with lower calorie diet (via better food choices (lower glycemic and portion control) along with exercise building up to or more than 30 minutes 5 days per week including some aerobic activity and strength training   Pt is happy with weight loss so far Commended

## 2023-08-09 ENCOUNTER — Ambulatory Visit (INDEPENDENT_AMBULATORY_CARE_PROVIDER_SITE_OTHER): Payer: Medicare HMO | Admitting: Family Medicine

## 2023-08-09 ENCOUNTER — Encounter: Payer: Self-pay | Admitting: Family Medicine

## 2023-08-09 VITALS — BP 128/68 | HR 69 | Temp 97.4°F | Ht 65.0 in | Wt 243.1 lb

## 2023-08-09 DIAGNOSIS — K219 Gastro-esophageal reflux disease without esophagitis: Secondary | ICD-10-CM

## 2023-08-09 DIAGNOSIS — E039 Hypothyroidism, unspecified: Secondary | ICD-10-CM | POA: Diagnosis not present

## 2023-08-09 DIAGNOSIS — G894 Chronic pain syndrome: Secondary | ICD-10-CM

## 2023-08-09 DIAGNOSIS — J4540 Moderate persistent asthma, uncomplicated: Secondary | ICD-10-CM

## 2023-08-09 DIAGNOSIS — R1013 Epigastric pain: Secondary | ICD-10-CM | POA: Diagnosis not present

## 2023-08-09 MED ORDER — LEVOTHYROXINE SODIUM 75 MCG PO TABS: 75.0000 ug | ORAL_TABLET | Freq: Every day | ORAL | 3 refills | Status: AC

## 2023-08-09 MED ORDER — FAMOTIDINE 20 MG PO TABS
20.0000 mg | ORAL_TABLET | Freq: Two times a day (BID) | ORAL | 2 refills | Status: DC
Start: 2023-08-09 — End: 2024-04-11

## 2023-08-09 NOTE — Assessment & Plan Note (Signed)
 Was ref to pulmonary  Strongly encouraged pt to call them and schedule this appointment   Lung exam was clear today Per pt has good and bad days

## 2023-08-09 NOTE — Progress Notes (Signed)
 Subjective:    Patient ID: Alexa Woods, female    DOB: 1961-01-21, 63 y.o.   MRN: [id number]  HPI  Wt Readings from Last 3 Encounters:  08/09/23 243 lb 2 oz (110.3 kg)  08/06/23 239 lb 8 oz (108.6 kg)  07/07/23 243 lb 2 oz (110.3 kg)   40.46 kg/m  Vitals:   08/09/23 1600  BP: 128/68  Pulse: 69  Temp: (!) 97.4 F (36.3 C)  SpO2: 96%    Pt presents for c/o epigastric pain  Also follow up hypothyroidism Also shortness of breath on exertion-has not called to schedule pulm appointment   Upper abd pain - acute on chronic  Worse after eating  Going on for some time - months  Has a history of GERD without esophagitis in past  Also diabetic gastroparesis / opiod gastroparesis (delayed gastric emptying study in 2012)  Did not tolerate reglan in the past   Entirely normal EGD 11/2021  Colonoscopy with polyp   Takes protonix 40 mg daily   Has had cardiac work up   Had ccy in the past   CT abd /pelvis 2023  No abn besides aortic atherosclerosis   Describes as under ribs in middle Dull pain - feels like she gets punched in the gut  Not burning  Happens after she eats  Feels nauseated also   From last GI visit:   Gastroparesis due to DM (HCC) With oxycodone use will be difficult to treat Discussed diet with small, frequent meals, soft diet with the patient.  Can refer to dietician if she calls back, given information about rides May benefit from referral to [facility] for motility specialist for discussion of long term options., Due to age, co morbidities, did not offer metoclopramide trial. , and Will need to optimize bowel regimen.  Occational trouble swallowing-noted by GI also   Lab Results  Component Value Date   WBC 7.7 08/06/2023   HGB 13.9 08/06/2023   HCT 43.1 08/06/2023   MCV 82.7 08/06/2023   PLT 277 08/06/2023   Lab Results  Component Value Date   NA 144 08/06/2023   K 3.8 08/06/2023   CO2 31 08/06/2023   GLUCOSE 82 08/06/2023   BUN 19  08/06/2023   CREATININE 1.03 08/06/2023   CALCIUM 9.9 08/06/2023   GFR 54.18 (L) 04/21/2023   GFRNONAA >60 06/27/2023   Lab Results  Component Value Date   ALT 23 08/06/2023   AST 23 08/06/2023   ALKPHOS 91 04/12/2023   BILITOT 0.4 08/06/2023   Lab Results  Component Value Date   LIPASE 27 06/05/2021     Patient Active Problem List   Diagnosis Date Noted   SOB (shortness of breath) on exertion 07/19/2023   CAD (coronary artery disease) 04/21/2023   Chest pain 04/13/2023   Right shoulder injury 03/16/2023   Pedal edema 01/01/2023   Frequent falls 08/14/2022   Statin myopathy 07/29/2022   Itching 07/29/2022   Aortic atherosclerosis (HCC) 07/29/2022   Chronic pain 02/10/2022   Transportation insecurity 05/09/2021   Financial insecurity 05/09/2021   Colon cancer screening 11/08/2020   Current use of proton pump inhibitor 11/04/2020   History of total knee replacement, right 10/21/2020   Trigger finger of right hand 12/20/2019   Dry eyes 09/05/2019   Intractable headache 08/25/2019   Hypothyroidism 04/04/2019   Screening mammogram, encounter for 04/03/2019   Routine general medical examination at a health care facility 04/03/2019   Encounter for screening for HIV  04/03/2019   Vitamin D deficiency 04/03/2019   Intertrigo 02/21/2019   Chronic constipation 12/26/2017   History of left knee replacement 12/26/2017   Primary osteoarthritis involving multiple joints 12/13/2017   Primary osteoarthritis of left knee 12/06/2017   Chronic neck pain 08/13/2017   Pain in left knee 06/22/2017   Morbid obesity (HCC) 01/21/2017   Joint swelling 12/20/2013   Periodontal disease 02/03/2013   Hyperlipidemia associated with type 2 diabetes mellitus (HCC) 09/27/2012   Cough 03/04/2011   Low back pain 07/22/2010   Epigastric pain 04/18/2010   Hypokalemia 12/12/2008   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   Type 2 diabetes mellitus with peripheral neuropathy (HCC) 11/30/2006   History  of alcohol abuse 11/30/2006   Depression with anxiety 11/30/2006   Essential hypertension 11/30/2006   Hemorrhoids 11/30/2006   Allergic rhinitis 11/30/2006   Moderate asthma 11/30/2006   GERD without esophagitis 11/30/2006   Psoriasis 11/30/2006   Past Medical History:  Diagnosis Date   Alcohol abuse, in remission    since 1998   Allergic rhinitis    Anemia    Anxiety    Asthma    Bilateral lower extremity edema    Bulging lumbar disc    Bulging of cervical intervertebral disc    Chronic constipation    DDD (degenerative disc disease), lumbosacral    Depression    Dyspnea    Gastroparesis    GERD (gastroesophageal reflux disease)    Hemorrhoids    History of Bell's palsy 08/2007   left   History of chronic cystitis    IBS (irritable bowel syndrome)    Insulin dependent type 2 diabetes mellitus [facility])    endocrinologist-- dr Elvera Lennox   Lower urinary tract symptoms (LUTS)    OA (osteoarthritis)    knees, back, hands, elbows   OSA (obstructive sleep apnea)    per study 06-08-2017 mild osa , cpap recommended , per pt insurance issue   Peripheral neuropathy    PONV (postoperative nausea and vomiting)    Psoriasis    S/P dilatation of esophageal stricture    Unspecified essential hypertension    Wears partial dentures    lower   Past Surgical History:  Procedure Laterality Date   CHOLECYSTECTOMY OPEN  1990   AND APPENDECTOMY   DOBUTAMINE STRESS ECHO  2009    normal stress echo, no evidence of ischemia   ELBOW SURGERY Bilateral RIGHT 2013;  LEFT 2016   for nerve damage   ESOPHAGOGASTRODUODENOSCOPY  07/2002   erythematous gastropathy   eye sugery Bilateral    KNEE ARTHROSCOPY Left 11/ 2018   dr Lequita Halt  @ [facility]   KNEE ARTHROSCOPY W/ PARTIAL MEDIAL MENISCECTOMY Right 10/2008   and chondroplasty   SHOULDER ARTHROSCOPY WITH DISTAL CLAVICLE RESECTION Left 12/ 2011   dr dean   TOTAL KNEE ARTHROPLASTY Right 07-01-2009  dr Lequita Halt   [facility]   TOTAL KNEE ARTHROPLASTY Left  12/06/2017   Procedure: LEFT TOTAL LEFT KNEE ARTHROPLASTY;  Surgeon: Ollen Gross, MD;  Location: [facility];  Service: Orthopedics;  Laterality: Left;   Trigger finger surgery Left 07/2022   VAGINAL HYSTERECTOMY  2003   Social History   Tobacco Use   Smoking status: Former    Current packs/day: 0.00    Types: Cigarettes    Start date: 12/30/1976    Quit date: 12/30/2001    Years since quitting: 21.6   Smokeless tobacco: Never  Vaping Use   Vaping status: Never Used  Substance Use Topics  Alcohol use: No    Alcohol/week: 0.0 standard drinks of alcohol    Comment: hx alcoholism--  stopped 1998    Drug use: No   Family History  Problem Relation Age of Onset   Alcohol abuse Mother    Hypertension Mother    Diabetes Mother    Heart failure Mother    Diabetes Sister    Heart attack Sister    Diabetes Sister    Diabetes Sister    Diabetes Sister    Celiac disease Daughter    Esophageal cancer Maternal Grandmother    Diabetes Brother    Parkinson's disease Brother    Heart attack Brother    Heart attack Brother 28   Diabetes Brother    Diabetes Brother    Diabetes Brother    Thyroid disease Niece    Lung cancer Other        mat great uncle   Colon cancer Neg Hx    Breast cancer Neg Hx    Allergies  Allergen Reactions   Bupropion Hcl Other (See Comments)    Pt is unsure   Cefuroxime Axetil Nausea Only   Crestor [Rosuvastatin Calcium]     Pt states it makes her feel weird    Gabapentin Other (See Comments)    Pt does not remember reaction  Pt does not remember reaction    Lidocaine Other (See Comments)    REACTION: unknown REACTION: unknown   Metformin Other (See Comments)    REACTION: GI REACTION: GI   Other Other (See Comments)   Paroxetine Other (See Comments)    REACTION: doesn't agree REACTION: doesn't agree   Propoxyphene Other (See Comments)    wheezing wheezing   Tramadol Other (See Comments)    REACTION: Causes Anxiety   Tramadol Hcl      REACTION: Causes Anxiety   Wellbutrin [Bupropion] Other (See Comments)    Pt is unsure   Codeine Nausea And Vomiting and Rash   Sulfa Antibiotics Rash and Other (See Comments)   Current Outpatient Medications on File Prior to Visit  Medication Sig Dispense Refill   albuterol (VENTOLIN HFA) 108 (90 Base) MCG/ACT inhaler INHALE 2 PUFFS INTO THE LUNGS EVERY 4 HOURS AS NEEDED FOR WHEEZING OR SHORTNESS OF BREATH. 18 each 2   B-D UF III MINI PEN NEEDLES 31G X 5 MM MISC USE AS INSTRUCTED FOR 4X DAILY INJECTIONS 400 each 1   BD INSULIN SYRINGE U/F 31G X 5/16" 0.5 ML MISC USE THREE TIMES PER DAY WITH R INSULIN & ONCE DAILY WITH LEVEMIR 400 each 2   benzonatate (TESSALON) 200 MG capsule Take 1 capsule (200 mg total) by mouth 3 (three) times daily as needed for cough. Swallow whole 30 capsule 1   budesonide-formoterol (SYMBICORT) 80-4.5 MCG/ACT inhaler Inhale 2 puffs into the lungs 2 (two) times daily. 1 each 3   cholecalciferol (VITAMIN D3) 25 MCG (1000 UNIT) tablet Take 2,000 Units by mouth daily.     dapagliflozin propanediol (FARXIGA) 10 MG TABS tablet Take 1 tablet (10 mg total) by mouth daily before breakfast. (Patient taking differently: Take 5 mg by mouth daily before breakfast.) 90 tablet 3   docusate sodium (COLACE) 100 MG capsule Take 100 mg by mouth every morning.     ezetimibe (ZETIA) 10 MG tablet TAKE 1 TABLET BY MOUTH EVERY DAY 90 tablet 1   fluticasone (FLONASE) 50 MCG/ACT nasal spray PLACE 2 SPRAYS INTO BOTH NOSTRILS DAILY AS NEEDED FOR ALLERGIES OR RHINITIS. 48 mL 1  glucose blood (ACCU-CHEK GUIDE TEST) test strip Use to check blood sugar 4-5 times a day. 500 each 3   hydrochlorothiazide (HYDRODIURIL) 25 MG tablet Take 1 tablet (25 mg total) by mouth daily. 90 tablet 1   insulin glargine (LANTUS SOLOSTAR) 100 UNIT/ML Solostar Pen INJECT 30-35 UNITS INTO THE SKIN DAILY. 30 mL 3   Insulin Pen Needle 32G X 4 MM MISC Use 4x a day 300 each 3   Insulin Regular Human (NOVOLIN R FLEXPEN RELION)  100 UNIT/ML KwikPen Inject 18-25 units under skin before each meal 45 mL 3   ketoconazole (NIZORAL) 2 % cream Apply 1 Application topically daily as needed for irritation. 30 g 1   KLOR-CON M10 10 MEQ tablet TAKE 1 TABLET BY MOUTH EVERY DAY 90 tablet 1   Lancets (ONETOUCH ULTRASOFT) lancets Use to test blood sugar 4 times daily as instructed. 200 each 11   LORazepam (ATIVAN) 2 MG tablet Take 2 mg by mouth 2 (two) times daily.     naloxone (NARCAN) 4 MG/0.1ML LIQD nasal spray kit      oxyCODONE-acetaminophen (PERCOCET) 10-325 MG tablet Take 1 tablet by mouth every 4 (four) hours as needed for pain. Hold for SBP < = 105 12 tablet 0   pantoprazole (PROTONIX) 40 MG tablet Take 40 mg by mouth daily. In am 30 min before food or medication or vitamins     sertraline (ZOLOFT) 100 MG tablet Take 200 mg by mouth every morning.     tiZANidine (ZANAFLEX) 4 MG tablet Take 4 mg by mouth 2 (two) times daily as needed for muscle spasms.      zolpidem (AMBIEN) 10 MG tablet Take 10 mg by mouth at bedtime as needed for sleep.     No current facility-administered medications on file prior to visit.    Review of Systems  Constitutional:  Positive for fatigue. Negative for activity change, appetite change, fever and unexpected weight change.  HENT:  Negative for congestion, ear pain, rhinorrhea, sinus pressure and sore throat.   Eyes:  Negative for pain, redness and visual disturbance.  Respiratory:  Negative for cough, shortness of breath and wheezing.   Cardiovascular:  Negative for chest pain and palpitations.  Gastrointestinal:  Positive for abdominal pain and nausea. Negative for abdominal distention, anal bleeding, blood in stool, constipation, diarrhea, rectal pain and vomiting.  Endocrine: Negative for polydipsia and polyuria.  Genitourinary:  Negative for dysuria, frequency and urgency.  Musculoskeletal:  Positive for arthralgias, back pain and myalgias.  Skin:  Negative for pallor and rash.   Allergic/Immunologic: Negative for environmental allergies.  Neurological:  Negative for dizziness, syncope and headaches.  Hematological:  Negative for adenopathy. Does not bruise/bleed easily.  Psychiatric/Behavioral:  Negative for decreased concentration and dysphoric mood. The patient is not nervous/anxious.        Objective:   Physical Exam Constitutional:      General: She is not in acute distress.    Appearance: Normal appearance. She is well-developed. She is obese. She is not ill-appearing or diaphoretic.  HENT:     Head: Normocephalic and atraumatic.     Mouth/Throat:     Mouth: Mucous membranes are moist.  Eyes:     General: No scleral icterus.    Conjunctiva/sclera: Conjunctivae normal.     Pupils: Pupils are equal, round, and reactive to light.  Cardiovascular:     Rate and Rhythm: Normal rate and regular rhythm.     Heart sounds: Normal heart sounds.  Pulmonary:  Effort: Pulmonary effort is normal. No respiratory distress.     Breath sounds: Normal breath sounds. No wheezing or rales.  Abdominal:     General: Abdomen is protuberant. Bowel sounds are normal. There is no distension.     Palpations: Abdomen is soft. There is no hepatomegaly, splenomegaly, mass or pulsatile mass.     Tenderness: There is abdominal tenderness in the epigastric area and left upper quadrant. There is no right CVA tenderness, left CVA tenderness, guarding or rebound. Negative signs include Murphy's sign and McBurney's sign.  Musculoskeletal:     Cervical back: Normal range of motion and neck supple.  Lymphadenopathy:     Cervical: No cervical adenopathy.  Skin:    General: Skin is warm and dry.     Coloration: Skin is not jaundiced or pale.     Findings: No bruising or erythema.  Neurological:     Mental Status: She is alert.     Cranial Nerves: No cranial nerve deficit.     Motor: No weakness.  Psychiatric:     Comments: Frustrated today           Assessment & Plan:    Problem List Items Addressed This Visit       Respiratory   Moderate asthma   Was ref to pulmonary  Strongly encouraged pt to call them and schedule this appointment   Lung exam was clear today Per pt has good and bad days         Digestive   GERD without esophagitis   No heartburn  Some nausea and epig pain  Normal EGD 2023  On protonix 40 mg -discussed proper way to take   Will add pepcid 20 mg bid today   GI follow up if not improved 2 weeks Gastroparesis is adding       Relevant Medications   famotidine (PEPCID) 20 MG tablet     Other   Epigastric pain - Primary   Acute on chronic  Reviewed work up /notes and labs from GI  Had normal EGD and CT in 2023  Symptoms are dull pain after eating  Tenderness is epigastric and a bit to the left I suspect gastroparesis is cause (from both dm and narcotic pain med)  Most recent labs are reassuring  Lipase added today  Encouraged her to discuss pain meds with her pain management team  Continue protonic 40 mg daily  Added pepcid 20 mg bid today  If not improved in 2 weeks/ will need a GI follow up  There was ? About possible [facility] sub specialist for gastroparesis in future as well         Relevant Orders   Lipase   Chronic pain   Oxycodone may be making her gastroparesis worse Encouraged her to discuss at pain clinic

## 2023-08-09 NOTE — Assessment & Plan Note (Signed)
 Lab Results  Component Value Date   TSH 5.38 (H) 08/06/2023   No missed levothyroxine doses  Will increase dose to 75 mcg daily  Encouraged to take correctly in am spaced out from food/vit  Re check 6 wk

## 2023-08-09 NOTE — Patient Instructions (Addendum)
 I think some of your upper abdominal pain is from gastroparesis  Gastroparesis is caused by both diabetes and narcotic pain medication (which you are taking)  It may be worth a trial off of your oxycodone in the future, talk to your pain management team about that   Labs for lipase now (pancreas)    Continue the protonix-this helps control acid  We can try adding some pepcid (famotidine) also to see if it helps the pain   If no improvement in 2 weeks you should follow up with your GI specialist   Go up on your levothyroxine from 50 to 75 mcg daily  Take it first thing in the am at least 30 minutes before food/ beverage or vitamins   Schedule thyroid labs in about 6 weeks  This may give you more energy   Call the pulmonary clinic when you can to schedule the appointment for your breathing

## 2023-08-09 NOTE — Assessment & Plan Note (Signed)
 Oxycodone may be making her gastroparesis worse Encouraged her to discuss at pain clinic

## 2023-08-09 NOTE — Assessment & Plan Note (Signed)
 Acute on chronic  Reviewed work up /notes and labs from GI  Had normal EGD and CT in 2023  Symptoms are dull pain after eating  Tenderness is epigastric and a bit to the left I suspect gastroparesis is cause (from both dm and narcotic pain med)  Most recent labs are reassuring  Lipase added today  Encouraged her to discuss pain meds with her pain management team  Continue protonic 40 mg daily  Added pepcid 20 mg bid today  If not improved in 2 weeks/ will need a GI follow up  There was ? About possible [facility] sub specialist for gastroparesis in future as well

## 2023-08-09 NOTE — Assessment & Plan Note (Signed)
 No heartburn  Some nausea and epig pain  Normal EGD 2023  On protonix 40 mg -discussed proper way to take   Will add pepcid 20 mg bid today   GI follow up if not improved 2 weeks Gastroparesis is adding

## 2023-08-10 ENCOUNTER — Encounter: Payer: Self-pay | Admitting: Family Medicine

## 2023-08-10 LAB — LIPASE: Lipase: 8 U/L — ABNORMAL LOW (ref 11.0–59.0)

## 2023-08-12 ENCOUNTER — Other Ambulatory Visit (HOSPITAL_COMMUNITY): Payer: Self-pay

## 2023-08-23 ENCOUNTER — Ambulatory Visit (INDEPENDENT_AMBULATORY_CARE_PROVIDER_SITE_OTHER): Payer: Medicare PPO | Admitting: Internal Medicine

## 2023-08-23 ENCOUNTER — Encounter: Payer: Self-pay | Admitting: Internal Medicine

## 2023-08-23 VITALS — BP 110/60 | HR 83 | Ht 65.0 in | Wt 239.8 lb

## 2023-08-23 DIAGNOSIS — E559 Vitamin D deficiency, unspecified: Secondary | ICD-10-CM | POA: Diagnosis not present

## 2023-08-23 DIAGNOSIS — E1159 Type 2 diabetes mellitus with other circulatory complications: Secondary | ICD-10-CM

## 2023-08-23 DIAGNOSIS — Z794 Long term (current) use of insulin: Secondary | ICD-10-CM

## 2023-08-23 DIAGNOSIS — E785 Hyperlipidemia, unspecified: Secondary | ICD-10-CM

## 2023-08-23 LAB — POCT GLYCOSYLATED HEMOGLOBIN (HGB A1C): Hemoglobin A1C: 6.9 % — AB (ref 4.0–5.6)
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
## 2023-08-23 NOTE — Patient Instructions (Addendum)
 Please continue: - Farxiga 10 mg before b'fast - Lantus 30 units at bedtime  Change: - R insulin: 15 min before meals  14-16 units before b'fast  10-12 units before lunch 10-16 units before dinner If you plan to be more active after a meal, please reduce the insulin before that meal by 5 units. If you have to take the insulin after you eat, take only half of the dose.  Please come back for a follow-up appointment in 4 months.

## 2023-08-24 ENCOUNTER — Encounter: Payer: Self-pay | Admitting: Pulmonary Disease

## 2023-08-24 ENCOUNTER — Ambulatory Visit (INDEPENDENT_AMBULATORY_CARE_PROVIDER_SITE_OTHER): Admitting: Pulmonary Disease

## 2023-08-24 VITALS — BP 124/76 | HR 70 | Temp 97.6°F | Ht 65.0 in | Wt 241.6 lb

## 2023-08-24 DIAGNOSIS — R0602 Shortness of breath: Secondary | ICD-10-CM | POA: Diagnosis not present

## 2023-08-24 MED ORDER — BUDESONIDE-FORMOTEROL FUMARATE 160-4.5 MCG/ACT IN AERO: 2.0000 | INHALATION_SPRAY | Freq: Two times a day (BID) | RESPIRATORY_TRACT | 3 refills | Status: DC

## 2023-08-25 NOTE — Progress Notes (Signed)
 Synopsis: Referred in by Judy Pimple, MD   Subjective:   PATIENT ID: Alexa Woods: female DOB: 02-Mar-1961, MRN: [id number]  Chief Complaint  Patient presents with   Consult    Cough, shortness of breath on exertion and occasional at rest. Wheezing.     HPI Alexa Woods is a pleasant 63 year old female patient with a past medical history of OSA not on CPAP, hypothyroidism, insulin-dependent diabetes mellitus type 2, GERD, moderate persistent asthma presenting today to the pulmonary clinic for ongoing shortness of breath on exertion for the past year.  She reports that since October of last year she has been experiencing worsening shortness of breath on exertion.  Associated with intermittent cough that is dry most of the time.  She does have history of OSA but does not wear CPAP.  She was diagnosed with asthma years ago has not been hospitalized for an asthma exacerbation in the past 3 years.  After reviewing her imaging She had normal chest x-ray in 2022 however in 2025 she had noticeable elevated right hemidiaphragm.  In 2025 she has chronic neck pain and had an MRI of the C-spine years ago that did not show bulging disc/osteophytes.   Family history: Mother with COPD was a heavy smoker as well as asthma  Social history: Quit smoking in 2003 smoked 1 pack/day for 30 years.  Lives at home with her husband.  Has 1 dog at home.  ROS All systems were reviewed and are negative except for the above. Objective:   Vitals:   08/24/23 1538  BP: 124/76  Pulse: 70  Temp: 97.6 F (36.4 C)  TempSrc: Temporal  SpO2: 97%  Weight: 241 lb 9.6 oz (109.6 kg)  Height: 5\' 5"  (1.651 m)   97% on RA BMI Readings from Last 3 Encounters:  08/24/23 40.20 kg/m  08/23/23 39.90 kg/m  08/09/23 40.46 kg/m   Wt Readings from Last 3 Encounters:  08/24/23 241 lb 9.6 oz (109.6 kg)  08/23/23 239 lb 12.8 oz (108.8 kg)  08/09/23 243 lb 2 oz (110.3 kg)    Physical Exam GEN: NAD,  Healthy Appearing HEENT: Supple Neck, Reactive Pupils, EOMI  CVS: Normal S1, Normal S2, RRR, No murmurs or ES appreciated  Lungs: Diminished at the right base Abdomen: Soft, non tender, non distended, + BS  Extremities: Warm and well perfused, No edema   Labs and imaging were reviewed  Ancillary Information   CBC    Component Value Date/Time   WBC 7.7 08/06/2023 1612   RBC 5.21 (H) 08/06/2023 1612   HGB 13.9 08/06/2023 1612   HGB 13.7 09/18/2020 1553   HCT 43.1 08/06/2023 1612   HCT 39.9 09/18/2020 1553   PLT 277 08/06/2023 1612   PLT 182 09/18/2020 1553   MCV 82.7 08/06/2023 1612   MCV 82 09/18/2020 1553   MCV 82 09/03/2014 1604   MCH 26.7 (L) 08/06/2023 1612   MCHC 32.3 08/06/2023 1612   RDW 13.2 08/06/2023 1612   RDW 12.8 09/18/2020 1553   RDW 13.7 09/03/2014 1604   LYMPHSABS 1.5 07/22/2022 1515   LYMPHSABS 2.0 09/18/2020 1553   LYMPHSABS 2.0 05/26/2013 1844   MONOABS 0.4 07/22/2022 1515   MONOABS 0.4 05/26/2013 1844   EOSABS 231 08/06/2023 1612   EOSABS 0.1 09/18/2020 1553   EOSABS 0.0 05/26/2013 1844   BASOSABS 62 08/06/2023 1612   BASOSABS 0.1 09/18/2020 1553   BASOSABS 0.0 05/26/2013 1844        No data to  display           Assessment & Plan:  Alexa Woods is a pleasant 63 year old female patient with a past medical history of OSA not on CPAP, hypothyroidism, insulin-dependent diabetes mellitus type 2, GERD, moderate persistent asthma presenting today to the pulmonary clinic for ongoing shortness of breath on exertion for the past year.  Her shortness of breath has been worsening for the past year.  With now noticeable elevated right hemidiaphragm.  Her worsening shortness of breath could be multifactorial in the setting of progressing asthma as well as right hemidiaphragm elevation.  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  She did have an MRI few years back that showed bulging disc and osteophytes  at this level.  I will obtain PFTs to assess her pulmonary capacity and lung function.  I will also obtain a CBC with differential and allergen panel to phenotype her asthma.  Furthermore I will increase her ICS/LABA and assess response in 3 months.  Regarding her elevated right hemidiaphragm I will obtain a sniff test to evaluate for right hemidiaphragm paralysis.  Finally she does have a history of OSA with an AHI of 13 years ago but could not afford CPAP at the time.  She is now interested in pursuing that.  Will repeat his sleep study with split-night.   Return in about 3 months (around 11/24/2023).  I spent 60 minutes caring for this patient today, including preparing to see the patient, obtaining a medical history , reviewing a separately obtained history, performing a medically appropriate examination and/or evaluation, counseling and educating the patient/family/caregiver, ordering medications, tests, or procedures, documenting clinical information in the electronic health record, and independently interpreting results (not separately reported/billed) and communicating results to the patient/family/caregiver  Janann Colonel, MD [facility] 08/25/2023 1:37 PM

## 2023-08-29 ENCOUNTER — Other Ambulatory Visit: Payer: Self-pay | Admitting: Internal Medicine

## 2023-08-29 DIAGNOSIS — E1159 Type 2 diabetes mellitus with other circulatory complications: Secondary | ICD-10-CM

## 2023-09-03 ENCOUNTER — Inpatient Hospital Stay
Admission: RE | Admit: 2023-09-03 | Discharge: 2023-09-03 | Disposition: A | Discharge status: Home / Self Care | Source: Ambulatory Visit | Attending: Pulmonary Disease | Admitting: Pulmonary Disease

## 2023-09-09 ENCOUNTER — Ambulatory Visit: Payer: Self-pay

## 2023-09-09 ENCOUNTER — Other Ambulatory Visit: Payer: Self-pay | Admitting: Family Medicine

## 2023-09-09 NOTE — Telephone Encounter (Signed)
 Copied from CRM [id number]. Topic: Clinical - Red Word Triage >> Sep 09, 2023  4:37 PM Sim Boast F wrote: Red Word that prompted transfer to Nurse Triage: Patient has had shortness of breath, chest pain, nausea and on/off headaches for the last few days, seen at pulmonology 08/24/23 but they haven't did any testing   Chief Complaint: Difficulty breathing  Symptoms: Mild shortness of breath, intermittent nasuea, chest pain, and headaches  Frequency: Intermittent  Disposition: [] ED /[] Urgent Care (no appt availability in office) / [x] Appointment(In office/virtual)/ []  [facility]/ [] Home Care/ [] Refused Recommended Disposition /[] [facility]/ []  Follow-up with PCP Additional Notes: Patient reports she has been experiencing difficulty breathing for the last few months. She states that over the last few days she has had some intermittent nausea, chest pain when she hasn't eaten, and headache. She denies any of these symptoms at this time. Patient states that she was recently seen by pulmonology and is waiting for the ordered testing to be done. I advised her that I see the order and provided her with the number to schedule the test. Patient has no further questions at this time. Patient instructed to call back for new or worsening symptoms. Patient verbalized understanding and agreement with this plan.  Patient has a wellness visit scheduled for tomorrow and did not want to schedule another appointment at this time.      Reason for Disposition  [1] MILD longstanding difficulty breathing AND [2]  SAME as normal  Answer Assessment - Initial Assessment Questions 1. RESPIRATORY STATUS: "Describe your breathing?" (e.g., wheezing, shortness of breath, unable to speak, severe coughing)      Out of breath  2. ONSET: "When did this breathing problem begin?"      Ongoing for a few months  3. PATTERN "Does the difficult breathing come and go, or has it been constant since it started?"       Intermittent  4. SEVERITY: "How bad is your breathing?" (e.g., mild, moderate, severe)    - MILD: No SOB at rest, mild SOB with walking, speaks normally in sentences, can lie down, no retractions, pulse < 100.    - MODERATE: SOB at rest, SOB with minimal exertion and prefers to sit, cannot lie down flat, speaks in phrases, mild retractions, audible wheezing, pulse 100-120.    - SEVERE: Very SOB at rest, speaks in single words, struggling to breathe, sitting hunched forward, retractions, pulse > 120      Mild now 5. RECURRENT SYMPTOM: "Have you had difficulty breathing before?" If Yes, ask: "When was the last time?" and "What happened that time?"      Yes for a few months  6. CARDIAC HISTORY: "Do you have any history of heart disease?" (e.g., heart attack, angina, bypass surgery, angioplasty)      Yes 7. LUNG HISTORY: "Do you have any history of lung disease?"  (e.g., pulmonary embolus, asthma, emphysema)     Yes  8. CAUSE: "What do you think is causing the breathing problem?"      Unsure  9. OTHER SYMPTOMS: "Do you have any other symptoms? (e.g., dizziness, runny nose, cough, chest pain, fever)     Intermittent chest pain, nausea, and headaches, none present now  Protocols used: Breathing Difficulty-A-AH

## 2023-09-10 ENCOUNTER — Ambulatory Visit (INDEPENDENT_AMBULATORY_CARE_PROVIDER_SITE_OTHER): Payer: Medicare HMO

## 2023-09-10 VITALS — Ht 65.0 in | Wt 241.0 lb

## 2023-09-10 DIAGNOSIS — Z5982 Transportation insecurity: Secondary | ICD-10-CM | POA: Diagnosis not present

## 2023-09-10 DIAGNOSIS — Z1231 Encounter for screening mammogram for malignant neoplasm of breast: Secondary | ICD-10-CM | POA: Diagnosis not present
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
## 2023-09-10 NOTE — Telephone Encounter (Signed)
 She has amw later today  Please check in with her / triage if needed  I think she has felt this way for a while breathing wise  Unsure when her PFTs are scheduled

## 2023-09-10 NOTE — Patient Instructions (Addendum)
 Alexa Woods , Thank you for taking time to come for your Medicare Wellness Visit. I appreciate your ongoing commitment to your health goals. Please review the following plan we discussed and let me know if I can assist you in the future.   Referrals/Orders/Follow-Ups/Clinician Recommendations:   Eye Appt: [email or web address] [facility] [address] [place], [place] [address]  [phone]  You have an order for:  []   2D Mammogram  [x]   3D Mammogram  []   Bone Density     Please call for appointment:  [facility] [phone]    Make sure to wear two-piece clothing.  No lotions, powders, or deodorants the day of the appointment. Make sure to bring picture ID and insurance card.  Bring list of medications you are currently taking including any supplements.    This is a list of the screening recommended for you and due dates:  Health Maintenance  Topic Date Due   Medicare Annual Wellness Visit  07/24/2023   Eye exam for diabetics  09/10/2023   COVID-19 Vaccine (1) 03/15/2024*   Zoster (Shingles) Vaccine (1 of 2) 10/03/2024*   Flu Shot  12/31/2023   Hemoglobin A1C  02/23/2024   Yearly kidney health urinalysis for diabetes  04/08/2024   Yearly kidney function blood test for diabetes  08/05/2024   Complete foot exam   08/22/2024   Mammogram  09/29/2024   DTaP/Tdap/Td vaccine (4 - Td or Tdap) 08/21/2028   Colon Cancer Screening  12/17/2031   Pneumococcal Vaccination  Completed   Hepatitis C Screening  Completed   HIV Screening  Completed   HPV Vaccine  Aged Out   Meningitis B Vaccine  Aged Out  *Topic was postponed. The date shown is not the original due date.    Advanced directives: (Declined) Advance directive discussed with you today. Even though you declined this today, please call our office should you change your mind, and we can give you the proper paperwork for you to fill out.  Next Medicare Annual Wellness Visit scheduled  for next year: Yes 09/13/23 @ 3:40pm televisit

## 2023-09-10 NOTE — Telephone Encounter (Signed)
 Prev note was triage note. Will also rote to pulmonary provider so they are aware
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
## 2023-09-14 ENCOUNTER — Telehealth: Payer: Self-pay | Admitting: *Deleted

## 2023-09-14 NOTE — Progress Notes (Unsigned)
 Complex Care Management Note Care Guide Note  09/14/2023 Name: Alexa Woods MRN: [id number] DOB: 19-Mar-1961   Complex Care Management Outreach Attempts: An unsuccessful telephone outreach was attempted today to offer the patient information about available complex care management services.  Follow Up Plan:  Additional outreach attempts will be made to offer the patient complex care management information and services.   Encounter Outcome:  No Answer  Kandis Ormond, CMA [place]  [facility], [facility] Guide Direct Dial: [phone]  Fax: [phone] Website: [email or web address]

## 2023-09-14 NOTE — Telephone Encounter (Signed)
 Left VM requesting pt to call the office back

## 2023-09-15 NOTE — Progress Notes (Unsigned)
 Complex Care Management Note Care Guide Note  09/15/2023 Name: Alexa Woods MRN: [id number] DOB: 10/15/1960   Complex Care Management Outreach Attempts: A second unsuccessful outreach was attempted today to offer the patient with information about available complex care management services.  Follow Up Plan:  Additional outreach attempts will be made to offer the patient complex care management information and services.   Encounter Outcome:  No Answer  Kandis Ormond, CMA [place]  [facility], [facility] Guide Direct Dial: [phone]  Fax: [phone] Website: [email or web address]

## 2023-09-16 ENCOUNTER — Other Ambulatory Visit: Payer: Self-pay | Admitting: Family Medicine

## 2023-09-16 NOTE — Progress Notes (Signed)
 Complex Care Management Note Care Guide Note  09/16/2023 Name: Alexa Woods MRN: [id number] DOB: 12-22-1960   Complex Care Management Outreach Attempts: A third unsuccessful outreach was attempted today to offer the patient with information about available complex care management services.  Follow Up Plan:  No further outreach attempts will be made at this time. We have been unable to contact the patient to offer or enroll patient in complex care management services.  Encounter Outcome:  No Answer  Kandis Ormond, CMA [place]  [facility], [facility] Guide Direct Dial: [phone]  Fax: [phone] Website: [email or web address]

## 2023-09-17 ENCOUNTER — Ambulatory Visit: Payer: Self-pay | Admitting: Pulmonary Disease

## 2023-09-17 NOTE — Telephone Encounter (Signed)
 Chief Complaint: wheezing Symptoms: SOB, chest tightness/pain Frequency: months Pertinent Negatives: Patient denies fever, URI sx, severe SOB, cardiac sx Disposition: [] ED /[x] Urgent Care (no appt availability in office) / [] Appointment(In office/virtual)/ []  [facility]/ [] Home Care/ [] Refused Recommended Disposition /[] [facility]/ []  Follow-up with PCP Additional Notes: Pt c/o chronic wheezing, SOB, chest tightness/pain x months. Pt endorses frequent ED visits that resulted in steroids and cardiac W/U. Pt initially calling to see when upcoming appt with Dr. Assakar is, and if he could provide additional guidance for relief of sx until appt. Pt reports taking respiratory INH as prescribed. Triager strongly recommended UC since [facility] and PCP closed for extended weekend. Pt feels that she is able to wait until Monday. Triager reinforced albuterol  usage and if not providing any sx relief, to seek emergent care. Patient verbalized understanding and to call back/911 with worsening symptoms.    Copied from CRM [id number]. Topic: Clinical - Red Word Triage >> Sep 17, 2023  2:52 PM Tyronne Galloway wrote: Red Word that prompted transfer to Nurse Triage: Patient has asthma and she stated she's been having a flair up in the last three to four days causing stomach pain, wheezing, shortness of breath here and there, and weakness. Reason for Disposition  [1] Wheezing or coughing AND [2] hasn't used neb or inhaler (up to 3 treatments given 20 minutes apart) AND [3] it's available  Answer Assessment - Initial Assessment Questions E2C2 Pulmonary Triage - Initial Assessment Questions "Chief Complaint (e.g., cough, sob, wheezing, fever, chills, sweat or additional symptoms) *Go to specific symptom protocol after initial questions. Wheezing, SOB, stomach pain s/p eating orange Hospitalized x 3  "How long have symptoms been present?" months  Have you tested for COVID or Flu? Note: If not, ask  patient if a home test can be taken. If so, instruct patient to call back for positive results. No  MEDICINES:   "Have you used any OTC meds to help with symptoms?" Yes If yes, ask "What medications?" Pepto - provided some relief Protonix  - daily  "Have you used your inhalers/maintenance medication?" Yes If yes, "What medications?" Symbicort  - 2 puffs twice daily Albuterol  PRN - last used last night, PRN q3-4 hrs  If inhaler, ask "How many puffs and how often?" Note: Review instructions on medication in the chart. See above  OXYGEN: "Do you wear supplemental oxygen?" No If yes, "How many liters are you supposed to use?" N/a  "Do you monitor your oxygen levels?" No If yes, "What is your reading (oxygen level) today?"    2. ONSET: "When did this asthma attack begin?"      Ongoing since October/November 2024 3. TRIGGER: "What do you think triggered this attack?" (e.g., URI, exposure to pollen or other allergen, tobacco smoke)      Weather change, pollen 4. PEAK EXPIRATORY FLOW RATE (PEFR): "Do you use a peak flow meter?" If Yes, ask: "What's the current peak flow? What's your personal best peak flow?"      N/a 5. SEVERITY: "How bad is this attack?"    - MILD: No SOB at rest, mild SOB with walking, speaks normally in sentences, can lie down, no retractions, pulse < 100. (GREEN Zone: PEFR 80-100%)   - MODERATE: SOB at rest, SOB with minimal exertion and prefers to sit, cannot lie down flat, speaks in phrases, mild retractions, audible wheezing, pulse 100-120. (YELLOW Zone: PEFR 50-79%)    - SEVERE: Struggling for each breath, speaks in single words, struggling to  breathe, sitting hunched forward, retractions, usually loud wheezing, sometimes minimal wheezing because of decreased air movement, pulse > 120. (RED Zone: PEFR < 50%).      Mild - with exertion only Triager does not appreciate audible wheezing/SOB. Pt speaking in full sentences. 8. OTHER SYMPTOMS: "Do you have any other  symptoms? (e.g., chest pain, coughing up yellow sputum, fever, runny nose)     Some intermittent chest pain - right in between ribs, can't take deep breaths "it's like it's too shallow" - endorses CP feels same as asthma attack Dry cough Afebrile - endorses sweats  Protocols used: Asthma Attack-A-AH

## 2023-09-20 ENCOUNTER — Other Ambulatory Visit

## 2023-09-21 ENCOUNTER — Other Ambulatory Visit (INDEPENDENT_AMBULATORY_CARE_PROVIDER_SITE_OTHER)

## 2023-09-21 DIAGNOSIS — E039 Hypothyroidism, unspecified: Secondary | ICD-10-CM

## 2023-09-22 ENCOUNTER — Encounter: Payer: Self-pay | Admitting: Family Medicine

## 2023-09-22 LAB — TSH: TSH: 4.12 u[IU]/mL (ref 0.35–5.50)

## 2023-09-23 ENCOUNTER — Other Ambulatory Visit: Payer: Self-pay | Admitting: *Deleted

## 2023-09-23 ENCOUNTER — Telehealth: Payer: Self-pay | Admitting: *Deleted

## 2023-09-23 DIAGNOSIS — F418 Other specified anxiety disorders: Secondary | ICD-10-CM

## 2023-09-23 DIAGNOSIS — Z5982 Transportation insecurity: Secondary | ICD-10-CM

## 2023-09-23 NOTE — Telephone Encounter (Signed)
 Dr. Malissa Se sent me a message saying:  Received the following from amw nurse   Nerissa Bannister, LPN  Alexa Woods, Alexa Seeds, Alexa Woods Hi! Pt tells story of an alcoholic husband who is inattentive to her needs and she is dependant on. She has no transportation for appts other than him and sts he is never sober. She battles depression and states she needs help but cannot get due to his control. Pt needs chronic care management referral.     Please have pt reach out to her psychiatrist who treats her depression to let them know she is not in good control    I think we have consulted social work in the past (a year ago)  She has transportation insecurity  I would like to initiate this again-is the past referral still good or do I need to place a new order ?    I think she already has pain management with Dr Rexanne Catalina (he provides her percocet)  Does she need a re referral to him for insurance purposes?   Thanks  Follow up if needed

## 2023-09-23 NOTE — Telephone Encounter (Signed)
 See phone note

## 2023-09-23 NOTE — Telephone Encounter (Signed)
 I put in a social work referral

## 2023-09-23 NOTE — Telephone Encounter (Signed)
 I have called pt multiple times but was able to reach her today.  Pt does want to proceed with a new social worker referral to see what services they may be able to help pt with. I asked pt about f/u with her psychiatrist to let them know how she is feeling. Pt said she didn't want to because all they do is try and give her more meds and she refused because she doesn't want to be "doped up". Pt encouraged to let them know how she is feeling. Pt said she isn't sure why she stopped seeing her therapist but hasn't seen one in years.  Pt said she is still dealing with abd pain/ rib pain and she can hardly eat anything without being nauseous. I asked her did she ever f/u with Dr. Aldona Amel and let her know like we recommended a while ago. Pt said every time she tells Dr. Aldona Amel she tells pt to f/u with Dr Malissa Se and "doesn't do anything about it" Pt also said she fell today she isn't sure why she fell because she didn't trip she was just standing up and fell. I offered to schedule her an appt to get eval for fall and abd pain and pt said she will schedule an appt "eventually" but declined to schedule an appt now for fall and said PCP already knows about abd pain but it's worsening  Pt also said she gets new insurance on May 1st so there will be a new form PCP has to fill out for her to get in program with insurance for pt's with chronic problems. Pt advise to drop off form when she gets it so we can review it.

## 2023-09-24 ENCOUNTER — Other Ambulatory Visit: Payer: Self-pay | Admitting: Family Medicine

## 2023-09-24 ENCOUNTER — Telehealth: Payer: Self-pay | Admitting: Family Medicine

## 2023-09-24 NOTE — Telephone Encounter (Signed)
 Last filled on 06/21/23 30 g/ with 1 refill,  CPE was on 08/06/23

## 2023-09-24 NOTE — Telephone Encounter (Signed)
 Patient came by and dropped some ppw for Dr. Malissa Se to fill out. Placed in her box up front.

## 2023-09-27 ENCOUNTER — Telehealth: Payer: Self-pay

## 2023-09-27 NOTE — Telephone Encounter (Signed)
Form is in your in box

## 2023-09-27 NOTE — Progress Notes (Signed)
 Complex Care Management Note  Care Guide Note 09/27/2023 Name: Alexa Woods MRN: [id number] DOB: Oct 11, 1960  Alexa Woods is a 63 y.o. year old female who sees Tower, Manley Seeds, MD for primary care. I reached out to Avery Lema by phone today to offer complex care management services.  Ms. Ohaver was given information about Complex Care Management services today including:   The Complex Care Management services include support from the care team which includes your Nurse Care Manager, Clinical Social Worker, or Pharmacist.  The Complex Care Management team is here to help remove barriers to the health concerns and goals most important to you. Complex Care Management services are voluntary, and the patient may decline or stop services at any time by request to their care team member.   Complex Care Management Consent Status: Patient agreed to services and verbal consent obtained.   Follow up plan:  Telephone appointment with complex care management team member scheduled for:  10/12/2023  Encounter Outcome:  Patient Scheduled  Lenton Rail , RMA     [place]  [facility], [facility] Guide  Direct Dial: [phone]  Website: [email or web address]

## 2023-09-28 NOTE — Telephone Encounter (Signed)
 Done/ put in IN box this am

## 2023-09-28 NOTE — Telephone Encounter (Signed)
Form has been faxed and sent to scanning.

## 2023-10-01 ENCOUNTER — Encounter

## 2023-10-01 ENCOUNTER — Ambulatory Visit

## 2023-10-03 ENCOUNTER — Telehealth: Payer: Self-pay | Admitting: Critical Care Medicine

## 2023-10-03 NOTE — Telephone Encounter (Signed)
 Notified by answering service of a call for SOB.  Insurance discontinued, didn't get prescribed sleep study More SOB today Has been using her symbicort  BID as prescribed; doesn't miss doses. She has been using albuterol  several times per day- q4h, maybe more. Today not getting relief. Previously was feeling an little better when she used it.  Suggested she got to the emergency department if she is not responding to albuterol . Per her last clinic notes, there is some uncertainty as to what is causing her dyspnea. She voices understanding.   Joesph Mussel, DO 10/03/23 6:40 PM [facility]

## 2023-10-04 ENCOUNTER — Telehealth: Payer: Self-pay | Admitting: Pulmonary Disease

## 2023-10-04 ENCOUNTER — Ambulatory Visit: Payer: Self-pay | Admitting: Pulmonary Disease

## 2023-10-04 DIAGNOSIS — J4520 Mild intermittent asthma, uncomplicated: Secondary | ICD-10-CM

## 2023-10-04 MED ORDER — PREDNISONE 20 MG PO TABS: 40.0000 mg | ORAL_TABLET | Freq: Every day | ORAL | 0 refills | Status: AC

## 2023-10-04 NOTE — Telephone Encounter (Signed)
 E2C2 Pulmonary Triage - Initial Assessment Questions "Chief Complaint (e.g., cough, sob, wheezing, fever, chills, sweat or additional symptoms) *Go to specific symptom protocol after initial questions. Patient with possible asthma exacerbation. Patient reports using her inhalers-patient reports using albuterol  and not having any improvement in symptoms. Patient reports increases in wheezing, shortness of breath and cough. Patient is audibly short of breath on the phone. Patient also endorses pain to middle of chest. Per protocol, patient is recommended to the ED to be evaluated. Patient verbalized understanding and all questions answered.   "How long have symptoms been present?" Several weeks-patient reports symptoms got worse yesterday. shotness  Have you tested for COVID or Flu? Note: If not, ask patient if a home test can be taken. If so, instruct patient to call back for positive results. No  MEDICINES:   "Have you used any OTC meds to help with symptoms?" Yes If yes, ask "What medications?" Cough syrup  "Have you used your inhalers/maintenance medication?" Yes If yes, "What medications?" Albuterol  Symbicort   If inhaler, ask "How many puffs and how often?" Note: Review instructions on medication in the chart. Albuterol  2 puffs Q4H PRN Symbicort  2 puffs BID  OXYGEN: "Do you wear supplemental oxygen?" No If yes, "How many liters are you supposed to use?"   "Do you monitor your oxygen levels?" No If yes, "What is your reading (oxygen level) today?" Patient doesn't have a finger pulse oximeter to check her oxygen level  "What is your usual oxygen saturation reading?"  (Note: Pulmonary O2 sats should be 90% or greater) Patient unable to say what her usual oxygen saturation is   Copied from CRM [id number]. Topic: Clinical - Red Word Triage >> Oct 04, 2023  2:41 PM Alexa Woods wrote: Red Word that prompted transfer to Nurse Triage: asthma flare up Reason for Disposition  [1] MODERATE  difficulty breathing (e.g., speaks in phrases, SOB even at rest, pulse 100-120) AND [2] NEW-onset or WORSE than normal  Answer Assessment - Initial Assessment Questions 1. RESPIRATORY STATUS: "Describe your breathing?" (e.g., wheezing, shortness of breath, unable to speak, severe coughing)      Shortness of breath 2. ONSET: "When did this breathing problem begin?"      Worsened yesterday. 3. PATTERN "Does the difficult breathing come and go, or has it been constant since it started?"      constant 4. SEVERITY: "How bad is your breathing?" (e.g., mild, moderate, severe)    - MILD: No SOB at rest, mild SOB with walking, speaks normally in sentences, can lie down, no retractions, pulse < 100.    - MODERATE: SOB at rest, SOB with minimal exertion and prefers to sit, cannot lie down flat, speaks in phrases, mild retractions, audible wheezing, pulse 100-120.    - SEVERE: Very SOB at rest, speaks in single words, struggling to breathe, sitting hunched forward, retractions, pulse > 120      moderate 5. RECURRENT SYMPTOM: "Have you had difficulty breathing before?" If Yes, ask: "When was the last time?" and "What happened that time?"      Yes-last ED visit Jan 2025 6. CARDIAC HISTORY: "Do you have any history of heart disease?" (e.g., heart attack, angina, bypass surgery, angioplasty)      no 7. LUNG HISTORY: "Do you have any history of lung disease?"  (e.g., pulmonary embolus, asthma, emphysema)     asthma 8. CAUSE: "What do you think is causing the breathing problem?"      Possible asthma exacerbation 9. OTHER SYMPTOMS: "  Do you have any other symptoms? (e.g., dizziness, runny nose, cough, chest pain, fever)     Potential chest pain-states pain to middle of chest stating rib pain.  10. O2 SATURATION MONITOR:  "Do you use an oxygen saturation monitor (pulse oximeter) at home?" If Yes, ask: "What is your reading (oxygen level) today?" "What is your usual oxygen saturation reading?" (e.g., 95%)        Unable to say due to not having a pulse oximeter 12. TRAVEL: "Have you traveled out of the country in the last month?" (e.g., travel history, exposures)       no  Protocols used: Breathing Difficulty-A-AH

## 2023-10-04 NOTE — Telephone Encounter (Signed)
 Prescribing Prednisone  and will schedule patient to see me next week.   Annitta Kindler, MD [facility] 10/04/2023 5:10 PM

## 2023-10-05 ENCOUNTER — Encounter: Payer: Self-pay | Admitting: Internal Medicine

## 2023-10-05 ENCOUNTER — Ambulatory Visit: Payer: Self-pay | Admitting: Pulmonary Disease

## 2023-10-05 ENCOUNTER — Ambulatory Visit (INDEPENDENT_AMBULATORY_CARE_PROVIDER_SITE_OTHER): Admitting: Internal Medicine

## 2023-10-05 ENCOUNTER — Telehealth: Payer: Self-pay | Admitting: Pulmonary Disease

## 2023-10-05 DIAGNOSIS — J45901 Unspecified asthma with (acute) exacerbation: Secondary | ICD-10-CM | POA: Diagnosis not present

## 2023-10-05 DIAGNOSIS — J4540 Moderate persistent asthma, uncomplicated: Secondary | ICD-10-CM

## 2023-10-05 DIAGNOSIS — J4520 Mild intermittent asthma, uncomplicated: Secondary | ICD-10-CM | POA: Diagnosis not present

## 2023-10-05 DIAGNOSIS — J986 Disorders of diaphragm: Secondary | ICD-10-CM

## 2023-10-05 MED ORDER — AZITHROMYCIN 250 MG PO TABS
ORAL_TABLET | ORAL | 0 refills | Status: DC
Start: 2023-10-05 — End: 2023-11-30

## 2023-10-05 MED ORDER — IPRATROPIUM-ALBUTEROL 0.5-2.5 (3) MG/3ML IN SOLN
3.0000 mL | Freq: Once | RESPIRATORY_TRACT | Status: AC
  Administered 2023-10-05: 3 mL via RESPIRATORY_TRACT

## 2023-10-05 MED ORDER — IPRATROPIUM-ALBUTEROL 0.5-2.5 (3) MG/3ML IN SOLN: 3.0000 mL | RESPIRATORY_TRACT | 12 refills | Status: AC | PRN

## 2023-10-05 NOTE — Telephone Encounter (Signed)
 Dr. Lucina Sabal would like to see her the week of 5/12. Please reach out to the office to schedule this.

## 2023-10-05 NOTE — Patient Instructions (Addendum)
 Continue prednisone  as prescribed Start antibiotics with Z pak Start DOUNEBS EVERY 6 hrs as needed DME REFERRAL FOR NEBULIZER MACHINE  Recommend weight loss Increase exercise capacity Continue inhalers as prescribed Rinse mouth with every use

## 2023-10-05 NOTE — Progress Notes (Signed)
 [facility]  Pulmonary Medicine Consultation      Date: 10/05/2023,   MRN# [id number] Alexa Woods 13-Feb-1961     CHIEF COMPLAINT:   Acute visit for shortness of breath   HISTORY OF PRESENT ILLNESS   63 year old female patient with a past medical history of OSA not on CPAP, hypothyroidism, insulin -dependent diabetes mellitus type 2, GERD, moderate persistent asthma presenting today to the pulmonary clinic acute visit ongoing shortness of breath on exertion for the past 1 year Patient found to have elevated right hemidiaphragm which can cause a restrictive process, along with her morbid obesity and deconditioned state  Highly recommend starting CPAP therapy for her OSA but is noncompliant Patient with increased work of breathing Increased wheezing  Ambulating pulse ox in the office did not reveal hypoxia Patient has acute asthma exacerbation at this time will prescribe antibiotics Will prescribe DuoNebs in office today for acute exacerbation  Patient started her prednisone  therapy today  Patient is noncompliant with CPAP she states she cannot afford it I have reviewed chest x-ray with her and review of right hemidiaphragm which has been noted since November 2024    PAST MEDICAL HISTORY   Past Medical History:  Diagnosis Date   Alcohol abuse, in remission    since 1998   Allergic rhinitis    Anemia    Anxiety    Asthma    Bilateral lower extremity edema    Bulging lumbar disc    Bulging of cervical intervertebral disc    Chronic constipation    DDD (degenerative disc disease), lumbosacral    Depression    Dyspnea    Gastroparesis    GERD (gastroesophageal reflux disease)    Hemorrhoids    History of Bell's palsy 08/2007   left   History of chronic cystitis    IBS (irritable bowel syndrome)    Insulin  dependent type 2 diabetes mellitus [facility])    endocrinologist-- dr Aldona Amel   Lower urinary tract symptoms (LUTS)    OA (osteoarthritis)    knees, back, hands,  elbows   OSA (obstructive sleep apnea)    per study 06-08-2017 mild osa , cpap recommended , per pt insurance issue   Peripheral neuropathy    PONV (postoperative nausea and vomiting)    Psoriasis    S/P dilatation of esophageal stricture    Unspecified essential hypertension    Wears partial dentures    lower     SURGICAL HISTORY   Past Surgical History:  Procedure Laterality Date   CHOLECYSTECTOMY OPEN  1990   AND APPENDECTOMY   DOBUTAMINE  STRESS ECHO  2009    normal stress echo, no evidence of ischemia   ELBOW SURGERY Bilateral RIGHT 2013;  LEFT 2016   for nerve damage   ESOPHAGOGASTRODUODENOSCOPY  07/2002   erythematous gastropathy   eye sugery Bilateral    KNEE ARTHROSCOPY Left 11/ 2018   dr France Ina  @ [facility]   KNEE ARTHROSCOPY W/ PARTIAL MEDIAL MENISCECTOMY Right 10/2008   and chondroplasty   SHOULDER ARTHROSCOPY WITH DISTAL CLAVICLE RESECTION Left 12/ 2011   dr dean   TOTAL KNEE ARTHROPLASTY Right 07-01-2009  dr France Ina   [facility]   TOTAL KNEE ARTHROPLASTY Left 12/06/2017   Procedure: LEFT TOTAL LEFT KNEE ARTHROPLASTY;  Surgeon: Liliane Rei, MD;  Location: [facility];  Service: Orthopedics;  Laterality: Left;   Trigger finger surgery Left 07/2022   VAGINAL HYSTERECTOMY  2003     FAMILY HISTORY   Family History  Problem Relation Age of  Onset   Alcohol abuse Mother    Hypertension Mother    Diabetes Mother    Heart failure Mother    Diabetes Sister    Heart attack Sister    Diabetes Sister    Diabetes Sister    Diabetes Sister    Celiac disease Daughter    Esophageal cancer Maternal Grandmother    Diabetes Brother    Parkinson's disease Brother    Heart attack Brother    Heart attack Brother 28   Diabetes Brother    Diabetes Brother    Diabetes Brother    Thyroid  disease Niece    Lung cancer Other        mat great uncle   Colon cancer Neg Hx    Breast cancer Neg Hx      SOCIAL HISTORY   Social History   Tobacco Use   Smoking status: Former     Current packs/day: 0.00    Average packs/day: 1 pack/day for 25.0 years (25.0 ttl pk-yrs)    Types: Cigarettes    Start date: 12/30/1976    Quit date: 12/30/2001    Years since quitting: 21.7   Smokeless tobacco: Never  Vaping Use   Vaping status: Never Used  Substance Use Topics   Alcohol use: No    Alcohol/week: 0.0 standard drinks of alcohol    Comment: hx alcoholism--  stopped 1998    Drug use: No     MEDICATIONS    Home Medication:  Current Outpatient Rx   Order #: [id number] Class: Historical Med   Order #: [id number] Class: Normal   Order #: [id number] Class: Normal   Order #: [id number] Class: Normal   Order #: [id number] Class: Normal   Order #: [id number] Class: Normal   Order #: [id number] Class: Normal   Order #: [id number] Class: Historical Med   Order #: [id number] Class: Historical Med   Order #: [id number] Class: Normal   Order #: [id number] Class: Normal   Order #: [id number] Class: Normal   Order #: [id number] Class: Normal   Order #: [id number] Class: Normal   Order #: [id number] Class: Normal   Order #: [id number] Class: Normal   Order #: [id number] Class: Normal   Order #: [id number] Class: Normal   Order #: [id number] Class: Normal   Order #: [id number] Class: Normal   Order #: [id number] Class: Normal   Order #: [id number] Class: Historical Med   Order #: [id number] Class: Historical Med   Order #: [id number] Class: No Print   Order #: [id number] Class: Historical Med   Order #: [id number] Class: Normal   Order #: [id number] Class: Historical Med   Order #: [id number] Class: Historical Med   Order #: [id number] Class: Historical Med    Current Medication:  Current Outpatient Medications:    FARXIGA  5 MG TABS tablet, Take 5 mg by mouth every morning., Disp: , Rfl:    albuterol  (VENTOLIN  HFA) 108 (90 Base) MCG/ACT inhaler, INHALE 2 PUFFS INTO THE LUNGS EVERY 4 HOURS AS NEEDED FOR WHEEZING OR SHORTNESS OF BREATH., Disp: 18 each, Rfl: 2   B-D UF III MINI PEN NEEDLES 31G X 5 MM MISC, USE AS INSTRUCTED FOR  4X DAILY INJECTIONS, Disp: 400 each, Rfl: 1   BD INSULIN  SYRINGE U/F 31G X 5/16" 0.5 ML MISC, USE THREE TIMES PER DAY WITH R INSULIN  & ONCE DAILY WITH LEVEMIR , Disp: 400 each, Rfl: 2   benzonatate  (TESSALON ) 200 MG capsule, Take 1 capsule (200 mg total) by mouth 3 (three) times daily as needed for cough. Swallow whole, Disp: 30 capsule, Rfl: 1   budesonide -formoterol  (SYMBICORT ) 160-4.5 MCG/ACT inhaler, Inhale 2 puffs into  the lungs in the morning and at bedtime., Disp: 3 each, Rfl: 3   budesonide -formoterol  (SYMBICORT ) 80-4.5 MCG/ACT inhaler, Inhale 2 puffs into the lungs 2 (two) times daily., Disp: 1 each, Rfl: 3   cholecalciferol (VITAMIN D3) 25 MCG (1000 UNIT) tablet, Take 2,000 Units by mouth daily., Disp: , Rfl:    docusate sodium  (COLACE) 100 MG capsule, Take 100 mg by mouth every morning., Disp: , Rfl:    ezetimibe  (ZETIA ) 10 MG tablet, TAKE 1 TABLET BY MOUTH EVERY DAY, Disp: 90 tablet, Rfl: 1   famotidine  (PEPCID ) 20 MG tablet, Take 1 tablet (20 mg total) by mouth 2 (two) times daily., Disp: 180 tablet, Rfl: 2   fluticasone  (FLONASE ) 50 MCG/ACT nasal spray, PLACE 2 SPRAYS INTO BOTH NOSTRILS DAILY AS NEEDED FOR ALLERGIES OR RHINITIS., Disp: 48 mL, Rfl: 1   glucose blood (ACCU-CHEK GUIDE TEST) test strip, Use to check blood sugar 4-5 times a day., Disp: 500 each, Rfl: 3   hydrochlorothiazide  (HYDRODIURIL ) 25 MG tablet, Take 1 tablet (25 mg total) by mouth daily., Disp: 90 tablet, Rfl: 1   insulin  glargine (LANTUS  SOLOSTAR) 100 UNIT/ML Solostar Pen, INJECT 30-35 UNITS INTO THE SKIN DAILY., Disp: 30 mL, Rfl: 3   Insulin  Pen Needle 32G X 4 MM MISC, Use 4x a day, Disp: 300 each, Rfl: 3   Insulin  Regular Human (NOVOLIN  R FLEXPEN RELION) 100 UNIT/ML KwikPen, Inject 18-25 units under skin before each meal, Disp: 45 mL, Rfl: 3   ketoconazole  (NIZORAL ) 2 % cream, APPLY 1 APPLICATION TOPICALLY DAILY AS NEEDED FOR IRRITATION., Disp: 30 g, Rfl: 1   KLOR-CON  M10 10 MEQ tablet, TAKE 1 TABLET BY MOUTH EVERY  DAY, Disp: 90 tablet, Rfl: 1   Lancets (ONETOUCH ULTRASOFT) lancets, Use to test blood sugar 4 times daily as instructed., Disp: 200 each, Rfl: 11   levothyroxine  (SYNTHROID ) 75 MCG tablet, Take 1 tablet (75 mcg total) by mouth daily., Disp: 90 tablet, Rfl: 3   LORazepam  (ATIVAN ) 2 MG tablet, Take 2 mg by mouth 2 (two) times daily., Disp: , Rfl:    naloxone (NARCAN) 4 MG/0.1ML LIQD nasal spray kit, , Disp: , Rfl:    oxyCODONE -acetaminophen  (PERCOCET) 10-325 MG tablet, Take 1 tablet by mouth every 4 (four) hours as needed for pain. Hold for SBP < = 105 (Patient not taking: Reported on 10/05/2023), Disp: 12 tablet, Rfl: 0   pantoprazole  (PROTONIX ) 40 MG tablet, Take 40 mg by mouth daily. In am 30 min before food or medication or vitamins, Disp: , Rfl:    predniSONE  (DELTASONE ) 20 MG tablet, Take 2 tablets (40 mg total) by mouth daily with breakfast for 5 days., Disp: 10 tablet, Rfl: 0   sertraline  (ZOLOFT ) 100 MG tablet, Take 200 mg by mouth every morning., Disp: , Rfl:    tiZANidine  (ZANAFLEX ) 4 MG tablet, Take 4 mg by mouth 2 (two) times daily as needed for muscle spasms.  (Patient not taking: Reported on 10/05/2023), Disp: , Rfl:    zolpidem  (AMBIEN ) 10 MG tablet, Take 10 mg by mouth at bedtime as needed for sleep., Disp: , Rfl:     ALLERGIES   Bupropion hcl, Cefuroxime axetil, Crestor  [rosuvastatin  calcium ], Gabapentin , Lidocaine, Metformin, Other, Paroxetine, Propoxyphene, Tramadol, Tramadol hcl, Wellbutrin [bupropion], Codeine, and Sulfa antibiotics    Review of Systems: Gen:  Denies  fever, sweats, chills weight loss  HEENT: Denies blurred vision, double vision, ear pain, eye pain, hearing loss, nose bleeds, sore throat Cardiac:  No dizziness, chest pain or  heaviness, chest tightness,edema, No JVD Resp:   + cough, -sputum production, +shortness of breath,-wheezing, -hemoptysis,  Other:  All other systems negative   Physical Examination:   General Appearance: No distress  EYES PERRLA,  EOM intact.   NECK Supple, No JVD Pulmonary: normal breath sounds, + wheezing.  CardiovascularNormal S1,S2.  No m/r/g.   Abdomen: Benign, Soft, non-tender. Neurology UE/LE 5/5 strength, no focal deficits Ext pulses intact, cap refill intact ALL OTHER ROS ARE NEGATIVE       ASSESSMENT/PLAN   63 year old morbidly obese white female with chronic shortness of breath mostly due to her morbid obesity with elevated right hemidiaphragm with underlying reactive airways disease consistent with asthma in the setting of deconditioned state  Asthma exacerbation Patient started prednisone  therapy prescribed by other provider today Avoid Allergens and Irritants Avoid secondhand smoke Avoid SICK contacts Recommend  Masking  when appropriate Recommend Keep up-to-date with vaccinations Continue to use inhalers as prescribed with Symbicort  Rinse mouth after use Follow up PFT's   Right elevated hemidiaphragm This is causing a restrictive process definitely restricting  her respiratory status Patient may need surgical intervention and assessment at some point At this time it seems that this will be an ongoing issue May consider referral to tertiary care center   MEDICATION ADJUSTMENTS/LABS AND TESTS ORDERED: Continue prednisone  as prescribed Start antibiotics with Z pak Start DOUNEBS EVERY 6 hrs as needed DME REFERRAL FOR NEBULIZER MACHINE Recommend weight loss Increase exercise capacity Continue inhalers as prescribed Rinse mouth with every use   CURRENT MEDICATIONS REVIEWED AT LENGTH WITH PATIENT TODAY   Patient  satisfied with Plan of action and management. All questions answered  Follow up next week with Dr. Lucina Sabal  I spent a total of 48 minutes reviewing chart data, face-to-face evaluation with the patient, counseling and coordination of care as detailed above.     Lady Pier, M.D.  [facility]  Medical Director [facility]  [facility] Medical Director [facility] Cardio-Pulmonary Department

## 2023-10-05 NOTE — Telephone Encounter (Signed)
 Copied from CRM [id number]. Topic: Clinical - Red Word Triage >> Oct 05, 2023  1:04 PM Alexa Woods wrote: Red Word that prompted transfer to Nurse Triage: sob, breathing issues, needs sooner appt. Coughing real bad   Chief Complaint: Shortness of Breath, Cough Symptoms: Dry cough Frequency: since last October---7 months ago Pertinent Negatives: Patient denies coughing up blood, fever. Disposition: [] ED /[] Urgent Care (no appt availability in office) / [x] Appointment(In office/virtual)/ []  [facility]/ [] Home Care/ [] Refused Recommended Disposition /[] [facility]/ []  Follow-up with PCP Additional Notes: Patient called and advised that she is having shortness of breath and a cough. She states that she has been to the Pulmonology Office. Patient states that she is supposed to have a sleep study and when she came to the office she wasn't able to due to insurance issues. Patient states that she is resting and feels okay not very short of breath and states it is only worse when she exerts herself.  Patient states that she was prescribed Prednisone  yesterday but hasn't been able to get it yet but she is going to get it today. Patient states she has no way to get to the hospital and she states that she did not want an ambulance called for her or anything. Appointment was made for her today 10/05/2023 at 4 pm with Dr Auston Left. Patient is advised however that if anything changes or worsens to call 911 for an ambulance or get someone to take her to the Emergency Room for further evaluation. Patient states she is fine to wait until her appointment and she verbalized understanding on these instructions.   Reason for Disposition  [1] MILD difficulty breathing (e.g., minimal/no SOB at rest, SOB with walking, pulse <100) AND [2] NEW-onset or WORSE than normal  Answer Assessment - Initial Assessment Questions E2C2 Pulmonary Triage - Initial Assessment Questions "Chief Complaint (e.g., cough,  sob, wheezing, fever, chills, sweat or additional symptoms) *Go to specific symptom protocol after initial questions. Shortness of breath, dry cough  "How long have symptoms been present?" 7 months  Have you tested for COVID or Flu? Note: If not, ask patient if a home test can be taken. If so, instruct patient to call back for positive results. No  MEDICINES:   "Have you used any OTC meds to help with symptoms?" Yes If yes, ask "What medications?" Cough syrup  "Have you used your inhalers/maintenance medication?" Yes If yes, "What medications?" Albuterol   2 puffs every 4 hours as needed Symbicort    2 puffs in the morning and at bedtime  If inhaler, ask "How many puffs and how often?" Note: Review instructions on medication in the chart. Albuterol   2 puffs every 4 hours as needed Symbicort    2 puffs in the morning and at bedtime  OXYGEN: "Do you wear supplemental oxygen?" No If yes, "How many liters are you supposed to use?" -----  "Do you monitor your oxygen levels?" No If yes, "What is your reading (oxygen level) today?" ----  "What is your usual oxygen saturation reading?"  (Note: Pulmonary O2 sats should be 90% or greater) ----    1. RESPIRATORY STATUS: "Describe your breathing?" (e.g., wheezing, shortness of breath, unable to speak, severe coughing)      Shortness of breath and dry cough 2. ONSET: "When did this breathing problem begin?"      Since last October 3. PATTERN "Does the difficult breathing come and go, or has it been constant since it started?"  Comes and goes and worse with exertion 4. SEVERITY: "How bad is your breathing?" (e.g., mild, moderate, severe)    - MILD: No SOB at rest, mild SOB with walking, speaks normally in sentences, can lie down, no retractions, pulse < 100.    - MODERATE: SOB at rest, SOB with minimal exertion and prefers to sit, cannot lie down flat, speaks in phrases, mild retractions, audible wheezing, pulse 100-120.    - SEVERE:  Very SOB at rest, speaks in single words, struggling to breathe, sitting hunched forward, retractions, pulse > 120      "If I go lay down it wont bother me much but if I go outside the trees bother me and I have an asthma attack" 5. RECURRENT SYMPTOM: "Have you had difficulty breathing before?" If Yes, ask: "When was the last time?" and "What happened that time?"      ------ 6. CARDIAC HISTORY: "Do you have any history of heart disease?" (e.g., heart attack, angina, bypass surgery, angioplasty)      Patient denies 7. LUNG HISTORY: "Do you have any history of lung disease?"  (e.g., pulmonary embolus, asthma, emphysema)     Asthma 8. CAUSE: "What do you think is causing the breathing problem?"      ----- 9. OTHER SYMPTOMS: "Do you have any other symptoms? (e.g., dizziness, runny nose, cough, chest pain, fever)     Dry cough, pain between ribs per patient 10. O2 SATURATION MONITOR:  "Do you use an oxygen saturation monitor (pulse oximeter) at home?" If Yes, ask: "What is your reading (oxygen level) today?" "What is your usual oxygen saturation reading?" (e.g., 95%)       -----  Protocols used: Breathing Difficulty-A-AH

## 2023-10-05 NOTE — Telephone Encounter (Signed)
 Dr. Auston Left is aware.   Nothing further needed.

## 2023-10-06 ENCOUNTER — Telehealth: Payer: Self-pay

## 2023-10-06 ENCOUNTER — Telehealth: Payer: Self-pay | Admitting: *Deleted

## 2023-10-06 NOTE — Progress Notes (Signed)
 Complex Care Management Care Guide Note  10/06/2023 Name: GLENNICE BLEACHER MRN: [id number] DOB: Jul 06, 1960  [name] is a 63 y.o. year old female who is a primary care patient of [facility], Manley Seeds, MD and is actively engaged with the care management team. I reached out to Avery Lema by phone today to assist with re-scheduling  with the Licensed Clinical Child psychotherapist.  Follow up plan: Unsuccessful telephone outreach attempt made.   Barnie Bora  [facility]  Value[facility]Based [facility], [facility] Guide  Direct Dial: [phone]  Fax [phone]

## 2023-10-06 NOTE — Telephone Encounter (Signed)
 Per the patient's chart, Dr. Auston Left sent her prescriptions to [facility] in [place].   ATC the patient but her voicemail box was full. I will try again later.

## 2023-10-06 NOTE — Telephone Encounter (Signed)
 Copied from CRM [id number]. Topic: Clinical - Medical Advice >> Oct 06, 2023 11:21 AM Juliana Ocean wrote: Reason for CRM: pt states she asked Dr Auston Left about the weight loss surgery.  He told her she needs to lose some more weight.  She ays she has lost 30 lbs and wants to know how much more she needs to lose?

## 2023-10-06 NOTE — Telephone Encounter (Signed)
 Patient advised that [facility] is at [facility] in [place] and is ready for her to pickup. NFN.

## 2023-10-06 NOTE — Telephone Encounter (Signed)
 Copied from CRM [id number]. Topic: Clinical - Prescription Issue >> Oct 05, 2023  5:08 PM Alexa Woods wrote: Reason for CRM: patient received her nebulizer but dont have her solution, can this be prescribed and sent to her pharmcy.

## 2023-10-06 NOTE — Telephone Encounter (Signed)
 I have notified the patient. Nothing further needed.

## 2023-10-07 ENCOUNTER — Telehealth: Payer: Self-pay | Admitting: Pulmonary Disease

## 2023-10-07 ENCOUNTER — Ambulatory Visit

## 2023-10-07 NOTE — Telephone Encounter (Signed)
 Patient needs sooner appointment per Dr. Lucina Sabal. Needs next week.

## 2023-10-07 NOTE — Telephone Encounter (Signed)
 We received a note from [facility]. The patient was scheduled to sleep study on 10/01/23 but she CXL the appt and didn't reschedule

## 2023-10-08 DIAGNOSIS — H5203 Hypermetropia, bilateral: Secondary | ICD-10-CM | POA: Diagnosis not present

## 2023-10-08 DIAGNOSIS — H40033 Anatomical narrow angle, bilateral: Secondary | ICD-10-CM | POA: Diagnosis not present

## 2023-10-08 DIAGNOSIS — H04123 Dry eye syndrome of bilateral lacrimal glands: Secondary | ICD-10-CM | POA: Diagnosis not present

## 2023-10-08 DIAGNOSIS — E113293 Type 2 diabetes mellitus with mild nonproliferative diabetic retinopathy without macular edema, bilateral: Secondary | ICD-10-CM | POA: Diagnosis not present

## 2023-10-08 DIAGNOSIS — H16223 Keratoconjunctivitis sicca, not specified as Sjogren's, bilateral: Secondary | ICD-10-CM | POA: Diagnosis not present

## 2023-10-08 DIAGNOSIS — H524 Presbyopia: Secondary | ICD-10-CM | POA: Diagnosis not present

## 2023-10-08 LAB — ALLERGEN PANEL (27) + IGE
Alternaria Alternata IgE: <0.1 kU/L
Aspergillus Fumigatus IgE: <0.1 kU/L
Bahia Grass IgE: 1.57 kU/L — AB
Bermuda Grass IgE: 1.5 kU/L — AB
Cat Dander IgE: <0.1 kU/L
Cedar, Mountain IgE: 0.45 kU/L — AB
Cladosporium Herbarum IgE: <0.1 kU/L
Cocklebur IgE: 0.98 kU/L — AB
Cockroach, American IgE: 0.19 kU/L — AB
Common Silver Birch IgE: 0.42 kU/L — AB
D Farinae IgE: <0.1 kU/L
D Pteronyssinus IgE: <0.1 kU/L
Dog Dander IgE: <0.1 kU/L
Elm, American IgE: 1.02 kU/L — AB
Hickory, White IgE: 0.5 kU/L — AB
IgE (Immunoglobulin E), Serum: 30 [IU]/mL (ref 6–495)
Johnson Grass IgE: 1.45 kU/L — AB
Kentucky Bluegrass IgE: 1.47 kU/L — AB
Maple/Box Elder IgE: 1.09 kU/L — AB
Mucor Racemosus IgE: 0.46 kU/L — AB
Oak, White IgE: 1.01 kU/L — AB
Penicillium Chrysogen IgE: <0.1 kU/L
Pigweed, Rough IgE: 0.58 kU/L — AB
Plantain, English IgE: 0.58 kU/L — AB
Ragweed, Short IgE: 1.07 kU/L — AB
Setomelanomma Rostrat: <0.1 kU/L
Timothy Grass IgE: 1.34 kU/L — AB
White Mulberry IgE: 0.53 kU/L — AB

## 2023-10-08 LAB — HM DIABETES EYE EXAM

## 2023-10-08 NOTE — Telephone Encounter (Signed)
 Lmtcb.   Copied from CRM [id number]. Topic: Clinical - Medical Advice >> Oct 07, 2023  4:59 PM Alexa Woods wrote: Reason for CRM: patient urgently needs assistance with the medication she is currently taking, she needs help with instructions and setting it up , also she wants to ask if she can take allergy medication with her current medicine. She mentioned the docotr wanted to see her much sooner than 05/20 ands wanted to know if her appointment will be changed and if so for when and time

## 2023-10-08 NOTE — Telephone Encounter (Signed)
 Patient reports she did figure out how to use Nebulizer.  Patient requesting lab results. Dr. Lucina Sabal please advised.    Thank you,

## 2023-10-10 ENCOUNTER — Other Ambulatory Visit: Payer: Self-pay | Admitting: Family Medicine

## 2023-10-12 ENCOUNTER — Telehealth: Payer: Self-pay | Admitting: *Deleted

## 2023-10-13 ENCOUNTER — Ambulatory Visit: Admitting: Pulmonary Disease

## 2023-10-13 ENCOUNTER — Telehealth: Payer: Self-pay

## 2023-10-13 ENCOUNTER — Encounter: Payer: Self-pay | Admitting: Pulmonary Disease

## 2023-10-13 NOTE — Telephone Encounter (Signed)
 Discussed with patient over the phone

## 2023-10-13 NOTE — Telephone Encounter (Signed)
 Copied from CRM [id number]. Topic: Clinical - Lab/Test Results >> Oct 13, 2023  3:14 PM Ilene Malling wrote: Reason for CRM: Patient [phone] would like allergy test results. Please advise and call back.

## 2023-10-13 NOTE — Telephone Encounter (Signed)
 Discussed over the phone.

## 2023-10-14 NOTE — Progress Notes (Signed)
 Complex Care Management Care Guide Note  10/14/2023 Name: Alexa Woods MRN: [id number] DOB: 12-Feb-1961  Alexa Woods is a 63 y.o. year old female who is a primary care patient of [facility] Manley Seeds, MD and is actively engaged with the care management team. I reached out to Avery Lema by phone today to assist with re-scheduling  with the Licensed Clinical Child psychotherapist.  Follow up plan: Unsuccessful telephone outreach attempt made. A HIPAA compliant phone message was left for the patient providing contact information and requesting a return call.  Barnie Bora  [facility]  Value-Based Care Institute, [facility] Guide  Direct Dial: [phone]  Fax [phone]

## 2023-10-18 NOTE — Progress Notes (Signed)
 Complex Care Management Care Guide Note  10/18/2023 Name: Alexa Woods MRN: [id number] DOB: 1960-08-31  Alexa Woods is a 63 y.o. year old female who is a primary care patient of [facility] Manley Seeds, MD and is actively engaged with the care management team. I reached out to Avery Lema by phone today to assist with re-scheduling  with the Licensed Clinical Child psychotherapist.  Follow up plan: Unsuccessful telephone outreach attempt made. A HIPAA compliant phone message was left for the patient providing contact information and requesting a return call.   Barnie Bora  [facility]  Value-Based Care Institute, [facility] Guide  Direct Dial: [phone]  Fax [phone]

## 2023-10-19 ENCOUNTER — Encounter: Payer: Self-pay | Admitting: Pulmonary Disease

## 2023-10-19 ENCOUNTER — Ambulatory Visit (INDEPENDENT_AMBULATORY_CARE_PROVIDER_SITE_OTHER): Admitting: Pulmonary Disease

## 2023-10-19 VITALS — BP 110/72 | HR 68 | Temp 97.1°F | Ht 66.0 in | Wt 233.8 lb

## 2023-10-19 DIAGNOSIS — J4540 Moderate persistent asthma, uncomplicated: Secondary | ICD-10-CM | POA: Diagnosis not present

## 2023-10-19 DIAGNOSIS — Z87891 Personal history of nicotine dependence: Secondary | ICD-10-CM

## 2023-10-19 MED ORDER — BUDESONIDE-FORMOTEROL FUMARATE 160-4.5 MCG/ACT IN AERO: 2.0000 | INHALATION_SPRAY | Freq: Two times a day (BID) | RESPIRATORY_TRACT | 12 refills | Status: DC

## 2023-10-19 MED ORDER — AEROCHAMBER MV MISC: 0 refills | Status: DC

## 2023-10-20 ENCOUNTER — Other Ambulatory Visit: Payer: Self-pay | Admitting: Family Medicine

## 2023-10-20 ENCOUNTER — Ambulatory Visit: Payer: Self-pay | Admitting: Pulmonary Disease

## 2023-10-20 DIAGNOSIS — J4520 Mild intermittent asthma, uncomplicated: Secondary | ICD-10-CM

## 2023-10-20 NOTE — Telephone Encounter (Signed)
 Copied from CRM [id number]. Topic: Clinical - Red Word Triage >> Oct 20, 2023  1:18 PM Alexa Woods wrote: [facility] that prompted transfer to Nurse Triage: Diff breathing, asthma attack, patient don't know what to do    Chief Complaint: Shortness of breath Symptoms: difficulty breathing, nausea, hoarse voice, chest tightness Frequency: back in October but she states she got worse last night Pertinent Negatives: Patient denies fever, vomiting, diarrhea,  Disposition: [] ED /[] Urgent Care (no appt availability in office) / [] Appointment(In office/virtual)/ []  [facility]/ [] Home Care/ [x] Refused Recommended Disposition /[] [facility]/ []  Follow-up with PCP Additional Notes: Patient called and advised that she is getting worse from being sick recently. She states that she was on Prednisone  and as long as it is in her system and without it she gets sick. Patient states that she saw Dr Lucina Sabal yesterday and he told her that if she gets worse to call him. She advises that she started feeling worse last night. Patient also states that she feels like her throat is closing up.  She states she had to have her esophagus stretched at one point. She states her airway doesn't feel swollen--she just feels like she can't cough up mucous like she needs. Patient states that the chest tightness is more of she has congestion in her chest that she cannot cough up. Patient is advised that the recommendation at this time is that she is seen and evaluated in the next 4 hours. Patient is offered an appointment this afternoon with her Pulmonologist but she states it is hard for her to get a ride.  She is also advised that Urgent Care is an alternative. Patient states that she would like a message to be sent to Dr Lucina Sabal since he advised her yesterday to call him if she felt like she was getting worse to see what he advises. She is also advised that if anything gets worse to go to the Emergency Room or  call 911 for an ambulance to take her to the Emergency Room. Patient verbalized understanding.            Reason for Disposition  [1] MILD difficulty breathing (e.g., minimal/no SOB at rest, SOB with walking, pulse <100) AND [2] NEW-onset or WORSE than normal  Answer Assessment - Initial Assessment Questions E2C2 Pulmonary Triage - Initial Assessment Questions "Chief Complaint (e.g., cough, sob, wheezing, fever, chills, sweat or additional symptoms) *Go to specific symptom protocol after initial questions. Shortness of breath, nausea  "How long have symptoms been present?" October but got worse last night  Have you tested for COVID or Flu? Note: If not, ask patient if a home test can be taken. If so, instruct patient to call back for positive results. No  MEDICINES:   "Have you used any OTC meds to help with symptoms?" Yes If yes, ask "What medications?" Cough syrup  "Have you used your inhalers/maintenance medication?" Yes If yes, "What medications?" Albuterol  inhaler-- Duoneb Nebulizer--  If inhaler, ask "How many puffs and how often?" Note: Review instructions on medication in the chart. Albuterol  inhaler--twice a day per patient Duoneb Nebulizer--she used this last night  OXYGEN: "Do you wear supplemental oxygen?" No If yes, "How many liters are you supposed to use?" N/a  "Do you monitor your oxygen levels?" No If yes, "What is your reading (oxygen level) today?" N/a  "What is your usual oxygen saturation reading?"  (Note: Pulmonary O2 sats should be 90% or greater) -------    3.  PATTERN "Does the difficult breathing come and go, or has it been constant since it started?"      Worse with exertion 4. SEVERITY: "How bad is your breathing?" (e.g., mild, moderate, severe)    - MILD: No SOB at rest, mild SOB with walking, speaks normally in sentences, can lie down, no retractions, pulse < 100.    - MODERATE: SOB at rest, SOB with minimal exertion and prefers  to sit, cannot lie down flat, speaks in phrases, mild retractions, audible wheezing, pulse 100-120.    - SEVERE: Very SOB at rest, speaks in single words, struggling to breathe, sitting hunched forward, retractions, pulse > 120      Not as bad per patient 5. RECURRENT SYMPTOM: "Have you had difficulty breathing before?" If Yes, ask: "When was the last time?" and "What happened that time?"      Patient states she had to go to the hospital at that time 6. CARDIAC HISTORY: "Do you have any history of heart disease?" (e.g., heart attack, angina, bypass surgery, angioplasty)      Patient was told years ago that she had a small heart attack 7. LUNG HISTORY: "Do you have any history of lung disease?"  (e.g., pulmonary embolus, asthma, emphysema)     Asthma 8. CAUSE: "What do you think is causing the breathing problem?"      unknown 9. OTHER SYMPTOMS: "Do you have any other symptoms? (e.g., dizziness, runny nose, cough, chest pain, fever)     Chest tightness,  Protocols used: Breathing Difficulty-A-AH

## 2023-10-21 DIAGNOSIS — M5459 Other low back pain: Secondary | ICD-10-CM | POA: Diagnosis not present

## 2023-10-21 DIAGNOSIS — F1010 Alcohol abuse, uncomplicated: Secondary | ICD-10-CM | POA: Diagnosis not present

## 2023-10-21 DIAGNOSIS — M5416 Radiculopathy, lumbar region: Secondary | ICD-10-CM | POA: Diagnosis not present

## 2023-10-21 DIAGNOSIS — Z79891 Long term (current) use of opiate analgesic: Secondary | ICD-10-CM | POA: Diagnosis not present

## 2023-10-21 NOTE — Progress Notes (Signed)
 Synopsis: Referred in by Alexa Curt, MD   Subjective:   PATIENT ID: Alexa Woods: female DOB: 08-Aug-1960, MRN: [id number]  Chief Complaint  Patient presents with   Follow-up    Better. DOE. Wheezing occasional but better. Dry cough.     HPI Alexa Woods is a pleasant 63 year old female patient with a past medical history of OSA not on CPAP, hypothyroidism, insulin -dependent diabetes mellitus type 2, GERD, moderate persistent asthma presenting today to the pulmonary clinic for ongoing shortness of breath on exertion for the past year.  She reports that since October of last year she has been experiencing worsening shortness of breath on exertion.  Associated with intermittent cough that is dry most of the time.  She does have history of OSA but does not wear CPAP.  She was diagnosed with asthma years ago has not been hospitalized for an asthma exacerbation in the past 3 years.  After reviewing her imaging She had normal chest x-ray in 2022 however in 2025 she had noticeable elevated right hemidiaphragm.  In 2025 she has chronic neck pain and had an MRI of the C-spine years ago that did not show bulging disc/osteophytes.   Labs EOS 231 in 2025  Allergen panel positive for multiple antigens including grass and trees ec..  IgE 30.   PFTs not done yet.   Family history: Mother with COPD was a heavy smoker as well as asthma  Social history: Quit smoking in 2003 smoked 1 pack/day for 30 years.  Lives at home with her husband.  Has 1 dog at home.  OV 10/21/2023 - Patient has been struggling with asthma flare ups for the last 4 months and required one round of prednisone  as outpatinet early may. She saw Dr. Auston Left as an acute visit and continued on prednisone . She does report feeling better today. She is on low dose ICS-LABA and we discussed today need to go up to moderate dose which she is agreable with. We also discussed obtaining PFTs and Split night which she is agreable to  both.   ROS All systems were reviewed and are negative except for the above. Objective:   Vitals:   10/19/23 1525  BP: 110/72  Pulse: 68  Temp: (!) 97.1 F (36.2 C)  SpO2: 98%  Weight: 233 lb 12.8 oz (106.1 kg)  Height: 5\' 6"  (1.676 m)   98% on RA BMI Readings from Last 3 Encounters:  10/19/23 37.74 kg/m  10/05/23 38.25 kg/m  09/10/23 40.10 kg/m   Wt Readings from Last 3 Encounters:  10/19/23 233 lb 12.8 oz (106.1 kg)  10/05/23 237 lb (107.5 kg)  09/10/23 241 lb (109.3 kg)    Physical Exam GEN: NAD, Healthy Appearing HEENT: Supple Neck, Reactive Pupils, EOMI  CVS: Normal S1, Normal S2, RRR, No murmurs or ES appreciated  Lungs: Diminished at the right base Abdomen: Soft, non tender, non distended, + BS  Extremities: Warm and well perfused, No edema   Labs and imaging were reviewed  Ancillary Information   CBC    Component Value Date/Time   WBC 7.7 08/06/2023 1612   RBC 5.21 (H) 08/06/2023 1612   HGB 13.9 08/06/2023 1612   HGB 13.7 09/18/2020 1553   HCT 43.1 08/06/2023 1612   HCT 39.9 09/18/2020 1553   PLT 277 08/06/2023 1612   PLT 182 09/18/2020 1553   MCV 82.7 08/06/2023 1612   MCV 82 09/18/2020 1553   MCV 82 09/03/2014 1604   MCH 26.7 (L) 08/06/2023  1612   MCHC 32.3 08/06/2023 1612   RDW 13.2 08/06/2023 1612   RDW 12.8 09/18/2020 1553   RDW 13.7 09/03/2014 1604   LYMPHSABS 1.5 07/22/2022 1515   LYMPHSABS 2.0 09/18/2020 1553   LYMPHSABS 2.0 05/26/2013 1844   MONOABS 0.4 07/22/2022 1515   MONOABS 0.4 05/26/2013 1844   EOSABS 231 08/06/2023 1612   EOSABS 0.1 09/18/2020 1553   EOSABS 0.0 05/26/2013 1844   BASOSABS 62 08/06/2023 1612   BASOSABS 0.1 09/18/2020 1553   BASOSABS 0.0 05/26/2013 1844        No data to display           Assessment & Plan:  Ms. Alexa Woods is a pleasant 63 year old female patient with a past medical history of OSA not on CPAP, hypothyroidism, insulin -dependent diabetes mellitus type 2, GERD, moderate persistent asthma  presenting today to the pulmonary clinic for ongoing shortness of breath on exertion for the past year.  Her shortness of breath has been worsening for the past year.  With now noticeable elevated right hemidiaphragm.  Her worsening shortness of breath could be multifactorial in the setting of progressing asthma as well as right hemidiaphragm elevation.  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  She did have an MRI few years back that showed bulging disc and osteophytes at this level.  I will obtain PFTs to assess her pulmonary capacity and lung function.  Furthermore I will increase her ICS/LABA and assess response in 3 months.  Regarding her elevated right hemidiaphragm I will obtain a sniff test to evaluate for right hemidiaphragm paralysis.  Finally she does have a history of OSA with an AHI of 13 years ago but could not afford CPAP at the time.  She is now interested in pursuing that.  Will repeat his sleep study with split-night.   Return in about 4 months (around 02/19/2024).  I spent 40 minutes caring for this patient today, including preparing to see the patient, obtaining a medical history , reviewing a separately obtained history, performing a medically appropriate examination and/or evaluation, counseling and educating the patient/family/caregiver, ordering medications, tests, or procedures, documenting clinical information in the electronic health record, and independently interpreting results (not separately reported/billed) and communicating results to the patient/family/caregiver  Annitta Kindler, MD [facility] 10/21/2023 4:29 PM

## 2023-10-22 MED ORDER — PREDNISONE 20 MG PO TABS: 40.0000 mg | ORAL_TABLET | Freq: Every day | ORAL | 0 refills | Status: DC

## 2023-10-24 ENCOUNTER — Other Ambulatory Visit: Payer: Self-pay | Admitting: Pulmonary Disease

## 2023-10-24 DIAGNOSIS — J4520 Mild intermittent asthma, uncomplicated: Secondary | ICD-10-CM

## 2023-10-27 ENCOUNTER — Telehealth: Payer: Self-pay | Admitting: Pulmonary Disease

## 2023-10-27 NOTE — Telephone Encounter (Signed)
 I called Alexa Woods to get her Sniff test rescheduled. She was asking about getting a spacer for her inhaler. She talked with her pharmacy and they stated her insurance wouldn't cover the device. What should she do?

## 2023-10-27 NOTE — Telephone Encounter (Signed)
 We did get some samples in yesterday. I will leave one at the front desk for her to pick up.  LMTCB. E2C2 please advise when patient calls back.

## 2023-10-28 NOTE — Telephone Encounter (Signed)
 I have left a sample spacer at the front desk for the patient. I did leave a detailed message on her secure voicemail notifying her.  Nothing further needed.

## 2023-11-01 ENCOUNTER — Telehealth: Payer: Self-pay | Admitting: *Deleted

## 2023-11-01 ENCOUNTER — Encounter

## 2023-11-01 ENCOUNTER — Ambulatory Visit
Admission: RE | Admit: 2023-11-01 | Discharge: 2023-11-01 | Disposition: A | Discharge status: Home / Self Care | Source: Ambulatory Visit | Attending: Family Medicine | Admitting: Family Medicine

## 2023-11-01 DIAGNOSIS — Z1231 Encounter for screening mammogram for malignant neoplasm of breast: Secondary | ICD-10-CM

## 2023-11-01 NOTE — Progress Notes (Signed)
 Complex Care Management Care Guide Note  11/01/2023 Name: Alexa Woods MRN: [id number] DOB: [birth year]  [name] is a 63 y.o. year old female who is a primary care patient of [facility], Manley Seeds, MD and is actively engaged with the care management team. I reached out to Avery Lema by phone today to assist with re-scheduling  with the Licensed Clinical Child psychotherapist.  Follow up plan: Unsuccessful telephone outreach attempt made. A HIPAA compliant phone message was left for the patient providing contact information and requesting a return call.  Barnie Bora  [facility]  [facility], [facility] Guide  Direct Dial: [phone]  Fax [phone]

## 2023-11-02 ENCOUNTER — Other Ambulatory Visit: Payer: Self-pay | Admitting: Internal Medicine

## 2023-11-02 NOTE — Progress Notes (Signed)
 Complex Care Management Care Guide Note  11/02/2023 Name: [name] MRN: [id number] DOB: 18-Sep-1960  Alexa Woods is a 63 y.o. year old female who is a primary care patient of [facility], Manley Seeds, MD and is actively engaged with the care management team. I reached out to Avery Lema by phone today to assist with re-scheduling  with the Licensed Clinical Child psychotherapist.  Follow up plan: Unsuccessful telephone outreach attempt made. A HIPAA compliant phone message was left for the patient providing contact information and requesting a return call. No further outreach attempts will be made at this time. We have been unable to contact the patient to reschedule for complex care management services.   Barnie Bora  [facility]  [facility], [facility] Guide  Direct Dial: [phone]  Fax [phone]

## 2023-11-03 ENCOUNTER — Other Ambulatory Visit: Payer: Self-pay | Admitting: Family Medicine

## 2023-11-03 ENCOUNTER — Telehealth: Payer: Self-pay | Admitting: Family Medicine

## 2023-11-03 DIAGNOSIS — N644 Mastodynia: Secondary | ICD-10-CM

## 2023-11-03 NOTE — Telephone Encounter (Signed)
 Copied from CRM [id number]. Topic: Clinical - Lab/Test Results >> Nov 03, 2023  1:16 PM Marlan Silva wrote: Reason for CRM: Lab will  be sending over urine culture form to be signed by Dr. Malissa Se.

## 2023-11-04 NOTE — Telephone Encounter (Addendum)
 Received a fax from a lab we are not aware of requesting an order for urinalysis. Call pt but no answer and pt's VM box is full, will await a call back to see what is going on with pt

## 2023-11-04 NOTE — Telephone Encounter (Signed)
 Pt returned my call she said she just received a random call from this lab asking if she wanted it done, she wasn't sure what they were talking about or why she would need it. Pt said she thinks it's spam so requested denied. Pt isn't having any UTI sxs at all.  FYI to PCP

## 2023-11-04 NOTE — Telephone Encounter (Signed)
 Have not seen it yet Who is is supposed to be from?

## 2023-11-04 NOTE — Telephone Encounter (Signed)
 Aware.  Thanks

## 2023-11-05 ENCOUNTER — Other Ambulatory Visit: Payer: Self-pay | Admitting: Family Medicine

## 2023-11-05 ENCOUNTER — Other Ambulatory Visit

## 2023-11-08 ENCOUNTER — Other Ambulatory Visit: Payer: Self-pay | Admitting: Family Medicine

## 2023-11-09 ENCOUNTER — Ambulatory Visit: Attending: Sleep Medicine

## 2023-11-09 DIAGNOSIS — J4540 Moderate persistent asthma, uncomplicated: Secondary | ICD-10-CM | POA: Insufficient documentation

## 2023-11-09 DIAGNOSIS — G4761 Periodic limb movement disorder: Secondary | ICD-10-CM | POA: Diagnosis not present

## 2023-11-09 DIAGNOSIS — G4733 Obstructive sleep apnea (adult) (pediatric): Secondary | ICD-10-CM | POA: Insufficient documentation

## 2023-11-19 DIAGNOSIS — R0683 Snoring: Secondary | ICD-10-CM

## 2023-11-26 ENCOUNTER — Telehealth: Payer: Self-pay

## 2023-11-26 NOTE — Telephone Encounter (Signed)
 I spoke with the patient. She said she is still having a hard time breathing. No increased SOB, new cough with sputum, or wheezing. The same breathing problems. She did say she is still using her inhalers. She said Prednisone  helps when she takes it. I asked if she wanted me to ask for a Prednisone  prescription to be sent in and she said no because it raises her sugar. She asked that I send a message back asking if there is anything that can be done? She is also asking for the results of her sleep study that was done on 6/20 (it is scanned into the system).  She is scheduled for PFT and an office visit on 12/09/2023.  [name], she does know you are out of the office until Monday and might not hear back until then.

## 2023-11-29 ENCOUNTER — Ambulatory Visit: Payer: Self-pay

## 2023-11-29 NOTE — Telephone Encounter (Signed)
 FYI Only or Action Required?: FYI only for provider.  Patient was last seen in primary care on 08/09/2023 by Randeen Laine LABOR, MD. Called Nurse Triage reporting Shortness of Breath. Symptoms began several days ago. Interventions attempted: Prescription medications: Nebulizer, inhalers. Symptoms are: unchanged.  Triage Disposition: See HCP Within 4 Hours (Or PCP Triage)  Patient/caregiver understands and will follow disposition?: Yes  **Patient sees Dr. Victory, but wishes to be evaluated sooner. Appt. Scheduled for 7/1 with Dr. Tamea.                      [name]      Copied from CRM [id number]. Topic: Clinical - Red Word Triage >> Nov 29, 2023  3:14 PM Rilla B wrote: Red Word that prompted transfer to Nurse Triage: Difficulty breathing, states her lungs hurt. Reason for Disposition  [1] Longstanding difficulty breathing (e.g., CHF, COPD, emphysema) AND [2] WORSE than normal  Answer Assessment - Initial Assessment Questions 1. RESPIRATORY STATUS: Describe your breathing? (e.g., wheezing, shortness of breath, unable to speak, severe coughing)      SOB  2. ONSET: When did this breathing problem begin?      A few days ago  3. PATTERN Does the difficult breathing come and go, or has it been constant since it started?       Intermittent, when she goes outside to let her dog out, symptoms worsen.   4. SEVERITY: How bad is your breathing? (e.g., mild, moderate, severe)    - MILD: No SOB at rest, mild SOB with walking, speaks normally in sentences, can lie down, no retractions, pulse < 100.    - MODERATE: SOB at rest, SOB with minimal exertion and prefers to sit, cannot lie down flat, speaks in phrases, mild retractions, audible wheezing, pulse 100-120.    - SEVERE: Very SOB at rest, speaks in single words, struggling to breathe, sitting hunched forward, retractions, pulse > 120      Moderate  5. RECURRENT SYMPTOM: Have you had difficulty breathing  before? If Yes, ask: When was the last time? and What happened that time?      Yes  8. CAUSE: What do you think is causing the breathing problem?      Unknown  9. OTHER SYMPTOMS: Do you have any other symptoms? (e.g., dizziness, runny nose, cough, chest pain, fever)     Chest congestion.  10. O2 SATURATION MONITOR:  Do you use an oxygen saturation monitor (pulse oximeter) at home? If Yes, ask: What is your reading (oxygen level) today? What is your usual oxygen saturation reading? (e.g., 95%)       Does not have pulse ox at home    She is using her nebulizer, and both inhalers, symptoms not improved, some relief providing. She states her right lung feels squeezed and is pain. Patient agrees to be seen in ED if SOB worsens before her 7/1 appointment.  Protocols used: Breathing Difficulty-A-AH

## 2023-11-30 ENCOUNTER — Ambulatory Visit (INDEPENDENT_AMBULATORY_CARE_PROVIDER_SITE_OTHER): Admitting: Pulmonary Disease

## 2023-11-30 ENCOUNTER — Ambulatory Visit
Admission: RE | Admit: 2023-11-30 | Discharge: 2023-11-30 | Disposition: A | Discharge status: Home / Self Care | Source: Ambulatory Visit | Attending: Pulmonary Disease | Admitting: Pulmonary Disease

## 2023-11-30 ENCOUNTER — Encounter: Payer: Self-pay | Admitting: Pulmonary Disease

## 2023-11-30 VITALS — BP 132/86 | HR 73 | Temp 98.7°F | Ht 66.0 in | Wt 235.8 lb

## 2023-11-30 DIAGNOSIS — Z87891 Personal history of nicotine dependence: Secondary | ICD-10-CM | POA: Diagnosis not present

## 2023-11-30 DIAGNOSIS — J209 Acute bronchitis, unspecified: Secondary | ICD-10-CM | POA: Diagnosis not present

## 2023-11-30 DIAGNOSIS — E66812 Obesity, class 2: Secondary | ICD-10-CM

## 2023-11-30 DIAGNOSIS — Z6838 Body mass index (BMI) 38.0-38.9, adult: Secondary | ICD-10-CM | POA: Diagnosis not present

## 2023-11-30 DIAGNOSIS — R918 Other nonspecific abnormal finding of lung field: Secondary | ICD-10-CM | POA: Diagnosis not present

## 2023-11-30 DIAGNOSIS — R0602 Shortness of breath: Secondary | ICD-10-CM

## 2023-11-30 DIAGNOSIS — J4521 Mild intermittent asthma with (acute) exacerbation: Secondary | ICD-10-CM

## 2023-11-30 DIAGNOSIS — J986 Disorders of diaphragm: Secondary | ICD-10-CM | POA: Diagnosis not present

## 2023-11-30 DIAGNOSIS — R9389 Abnormal findings on diagnostic imaging of other specified body structures: Secondary | ICD-10-CM | POA: Diagnosis not present

## 2023-11-30 DIAGNOSIS — J4540 Moderate persistent asthma, uncomplicated: Secondary | ICD-10-CM

## 2023-11-30 LAB — NITRIC OXIDE: Nitric Oxide: 10

## 2023-11-30 MED ORDER — AZITHROMYCIN 250 MG PO TABS: ORAL_TABLET | ORAL | 0 refills | Status: DC

## 2023-11-30 MED ORDER — PREDNISONE 20 MG PO TABS: 20.0000 mg | ORAL_TABLET | Freq: Every day | ORAL | 0 refills | Status: AC

## 2023-11-30 NOTE — Telephone Encounter (Signed)
 Patient saw Dr. Jayme Cloud today.  Nothing further needed.

## 2023-11-30 NOTE — Progress Notes (Unsigned)
 Subjective:    Patient ID: Alexa Woods, female    DOB: July 08, 1960, 63 y.o.   MRN: [id number]  Patient Care Team: Tower, Laine LABOR, MD as PCP - General (Family Medicine)  Chief Complaint  Patient presents with  . Follow-up    Shortness of breath. Cough and wheezing.     BACKGROUND:   HPI    Review of Systems A 10 point review of systems was performed and it is as noted above otherwise negative.   Past Medical History:  Diagnosis Date  . Alcohol abuse, in remission    since 1998  . Allergic rhinitis   . Anemia   . Anxiety   . Asthma   . Bilateral lower extremity edema   . Bulging lumbar disc   . Bulging of cervical intervertebral disc   . Chronic constipation   . DDD (degenerative disc disease), lumbosacral   . Depression   . Dyspnea   . Gastroparesis   . GERD (gastroesophageal reflux disease)   . Hemorrhoids   . History of Bell's palsy 08/2007   left  . History of chronic cystitis   . IBS (irritable bowel syndrome)   . Insulin  dependent type 2 diabetes mellitus [facility])    endocrinologist-- dr trixie  . Lower urinary tract symptoms (LUTS)   . OA (osteoarthritis)    knees, back, hands, elbows  . OSA (obstructive sleep apnea)    per study 06-08-2017 mild osa , cpap recommended , per pt insurance issue  . Peripheral neuropathy   . PONV (postoperative nausea and vomiting)   . Psoriasis   . S/P dilatation of esophageal stricture   . Unspecified essential hypertension   . Wears partial dentures    lower    Past Surgical History:  Procedure Laterality Date  . CHOLECYSTECTOMY OPEN  1990   AND APPENDECTOMY  . DOBUTAMINE  STRESS ECHO  2009    normal stress echo, no evidence of ischemia  . ELBOW SURGERY Bilateral RIGHT 2013;  LEFT 2016   for nerve damage  . ESOPHAGOGASTRODUODENOSCOPY  07/2002   erythematous gastropathy  . eye sugery Bilateral   . KNEE ARTHROSCOPY Left 11/ 2018   dr melodi  @ [facility]  . KNEE ARTHROSCOPY W/ PARTIAL MEDIAL MENISCECTOMY Right  10/2008   and chondroplasty  . SHOULDER ARTHROSCOPY WITH DISTAL CLAVICLE RESECTION Left 12/ 2011   dr dean  . TOTAL KNEE ARTHROPLASTY Right 07-01-2009  dr melodi   [facility]  . TOTAL KNEE ARTHROPLASTY Left 12/06/2017   Procedure: LEFT TOTAL LEFT KNEE ARTHROPLASTY;  Surgeon: melodi Lerner, MD;  Location: [facility];  Service: Orthopedics;  Laterality: Left;  . Trigger finger surgery Left 07/2022  . VAGINAL HYSTERECTOMY  2003    Patient Active Problem List   Diagnosis Date Noted  . SOB (shortness of breath) on exertion 07/19/2023  . CAD (coronary artery disease) 04/21/2023  . Chest pain 04/13/2023  . Right shoulder injury 03/16/2023  . Pedal edema 01/01/2023  . Frequent falls 08/14/2022  . Statin myopathy 07/29/2022  . Itching 07/29/2022  . Aortic atherosclerosis (HCC) 07/29/2022  . Chronic pain 02/10/2022  . Transportation insecurity 05/09/2021  . Financial insecurity 05/09/2021  . Colon cancer screening 11/08/2020  . Current use of proton pump inhibitor 11/04/2020  . History of total knee replacement, right 10/21/2020  . Trigger finger of right hand 12/20/2019  . Dry eyes 09/05/2019  . Intractable headache 08/25/2019  . Hypothyroidism 04/04/2019  . Screening mammogram, encounter for 04/03/2019  . Routine  general medical examination at a health care facility 04/03/2019  . Encounter for screening for HIV 04/03/2019  . Vitamin D  deficiency 04/03/2019  . Intertrigo 02/21/2019  . Chronic constipation 12/26/2017  . History of left knee replacement 12/26/2017  . Primary osteoarthritis involving multiple joints 12/13/2017  . Primary osteoarthritis of left knee 12/06/2017  . Chronic neck pain 08/13/2017  . Pain in left knee 06/22/2017  . Morbid obesity (HCC) 01/21/2017  . Joint swelling 12/20/2013  . Periodontal disease 02/03/2013  . Hyperlipidemia associated with type 2 diabetes mellitus (HCC) 09/27/2012  . Cough 03/04/2011  . Low back pain 07/22/2010  . Epigastric pain 04/18/2010   . Hypokalemia 12/12/2008  . 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  . Type 2 diabetes mellitus with peripheral neuropathy (HCC) 11/30/2006  . History of alcohol abuse 11/30/2006  . Depression with anxiety 11/30/2006  . Essential hypertension 11/30/2006  . Hemorrhoids 11/30/2006  . Allergic rhinitis 11/30/2006  . Moderate asthma 11/30/2006  . GERD without esophagitis 11/30/2006  . Psoriasis 11/30/2006    Family History  Problem Relation Age of Onset  . Alcohol abuse Mother   . Hypertension Mother   . Diabetes Mother   . Heart failure Mother   . Diabetes Sister   . Heart attack Sister   . Diabetes Sister   . Diabetes Sister   . Diabetes Sister   . Celiac disease Daughter   . Esophageal cancer Maternal Grandmother   . Diabetes Brother   . Parkinson's disease Brother   . Heart attack Brother   . Heart attack Brother 28  . Diabetes Brother   . Diabetes Brother   . Diabetes Brother   . Thyroid  disease Niece   . Lung cancer Other        mat great uncle  . Colon cancer Neg Hx   . Breast cancer Neg Hx     Social History   Tobacco Use  . Smoking status: Former    Current packs/day: 0.00    Average packs/day: 1 pack/day for 25.0 years (25.0 ttl pk-yrs)    Types: Cigarettes    Start date: 12/30/1976    Quit date: 12/30/2001    Years since quitting: 21.9  . Smokeless tobacco: Never  Substance Use Topics  . Alcohol use: No    Alcohol/week: 0.0 standard drinks of alcohol    Comment: hx alcoholism--  stopped 1998     Allergies  Allergen Reactions  . Bupropion Hcl Other (See Comments)    Pt is unsure  . Cefuroxime Axetil Nausea Only  . Crestor  [Rosuvastatin  Calcium ]     Pt states it makes her feel weird   . Gabapentin  Other (See Comments)    Pt does not remember reaction  Pt does not remember reaction   . Lidocaine Other (See Comments)    REACTION: unknown REACTION: unknown  . Metformin Other (See Comments)    REACTION: GI REACTION: GI  . Other Other (See Comments)   . Paroxetine Other (See Comments)    REACTION: doesn't agree REACTION: doesn't agree  . Propoxyphene Other (See Comments)    wheezing wheezing  . Tramadol Other (See Comments)    REACTION: Causes Anxiety  . Tramadol Hcl     REACTION: Causes Anxiety  . Wellbutrin [Bupropion] Other (See Comments)    Pt is unsure  . Codeine Nausea And Vomiting and Rash  . Sulfa Antibiotics Rash and Other (See Comments)    Current Meds  Medication Sig  . albuterol  (VENTOLIN  HFA) 108 (90  Base) MCG/ACT inhaler INHALE 2 PUFFS INTO THE LUNGS EVERY 4 HOURS AS NEEDED FOR WHEEZING OR SHORTNESS OF BREATH.  SABRA B-D UF III MINI PEN NEEDLES 31G X 5 MM MISC USE AS INSTRUCTED FOR 4X DAILY INJECTIONS  . BD INSULIN  SYRINGE U/F 31G X 5/16 0.5 ML MISC USE THREE TIMES PER DAY WITH R INSULIN  & ONCE DAILY WITH LEVEMIR   . benzonatate  (TESSALON ) 200 MG capsule Take 1 capsule (200 mg total) by mouth 3 (three) times daily as needed for cough. Swallow whole  . budesonide -formoterol  (SYMBICORT ) 160-4.5 MCG/ACT inhaler Inhale 2 puffs into the lungs in the morning and at bedtime.  . cholecalciferol (VITAMIN D3) 25 MCG (1000 UNIT) tablet Take 2,000 Units by mouth daily.  . docusate sodium  (COLACE) 100 MG capsule Take 100 mg by mouth every morning.  . ezetimibe  (ZETIA ) 10 MG tablet TAKE 1 TABLET BY MOUTH EVERY DAY  . famotidine  (PEPCID ) 20 MG tablet Take 1 tablet (20 mg total) by mouth 2 (two) times daily.  . FARXIGA  5 MG TABS tablet TAKE 1 TABLET BY MOUTH DAILY BEFORE BREAKFAST.  . fluticasone  (FLONASE ) 50 MCG/ACT nasal spray PLACE 2 SPRAYS INTO BOTH NOSTRILS DAILY AS NEEDED FOR ALLERGIES OR RHINITIS.  SABRA glucose blood (ACCU-CHEK GUIDE TEST) test strip Use to check blood sugar 4-5 times a day.  . hydrochlorothiazide  (HYDRODIURIL ) 25 MG tablet Take 1 tablet (25 mg total) by mouth daily.  . insulin  glargine (LANTUS  SOLOSTAR) 100 UNIT/ML Solostar Pen INJECT 30-35 UNITS INTO THE SKIN DAILY.  . Insulin  Pen Needle 32G X 4 MM MISC Use 4x a  day  . Insulin  Regular Human (NOVOLIN  R FLEXPEN RELION) 100 UNIT/ML KwikPen Inject 18-25 units under skin before each meal  . ipratropium-albuterol  (DUONEB) 0.5-2.5 (3) MG/3ML SOLN Take 3 mLs by nebulization every 4 (four) hours as needed.  . ketoconazole  (NIZORAL ) 2 % cream APPLY 1 APPLICATION TOPICALLY DAILY AS NEEDED FOR IRRITATION.  . KLOR-CON  M10 10 MEQ tablet TAKE 1 TABLET BY MOUTH EVERY DAY  . Lancets (ONETOUCH ULTRASOFT) lancets Use to test blood sugar 4 times daily as instructed.  . levothyroxine  (SYNTHROID ) 75 MCG tablet Take 1 tablet (75 mcg total) by mouth daily.  . LORazepam  (ATIVAN ) 2 MG tablet Take 2 mg by mouth 2 (two) times daily.  . naloxone (NARCAN) 4 MG/0.1ML LIQD nasal spray kit   . oxyCODONE -acetaminophen  (PERCOCET) 10-325 MG tablet Take 1 tablet by mouth every 4 (four) hours as needed for pain. Hold for SBP < = 105  . pantoprazole  (PROTONIX ) 40 MG tablet Take 40 mg by mouth daily. In am 30 min before food or medication or vitamins  . predniSONE  (DELTASONE ) 20 MG tablet Take 1 tablet (20 mg total) by mouth daily with breakfast for 5 days.  . sertraline  (ZOLOFT ) 100 MG tablet Take 200 mg by mouth every morning.  SABRA Spacer/Aero-Holding Chambers (AEROCHAMBER MV) inhaler Use as instructed  . tiZANidine  (ZANAFLEX ) 4 MG tablet Take 4 mg by mouth 2 (two) times daily as needed for muscle spasms.  . zolpidem  (AMBIEN ) 10 MG tablet Take 10 mg by mouth at bedtime as needed for sleep.    Immunization History  Administered Date(s) Administered  . Influenza Inj Mdck Quad Pf 05/06/2018  . Influenza Split 02/20/2011, 04/19/2012  . Influenza Whole 04/02/2006, 06/19/2009  . Influenza, Seasonal, Injecte, Preservative Fre 08/06/2023  . Influenza,inj,Quad PF,6+ Mos 04/02/2014, 08/02/2015, 04/07/2016, 04/05/2017  . Influenza,inj,quad, With Preservative 04/01/2017  . Influenza-Unspecified 02/07/2019  . PNEUMOCOCCAL CONJUGATE-20 08/06/2023  . Pneumococcal  Polysaccharide-23 06/29/2007,  08/02/2015  . Td 08/07/1988, 02/18/2003  . Td (Adult), 2 Lf Tetanus Toxid, Preservative Free 02/18/2003  . Tdap 08/22/2018        Objective:     BP 132/86 (BP Location: Left Arm, Patient Position: Sitting, Cuff Size: Normal)   Pulse 73   Temp 98.7 F (37.1 C) (Oral)   Ht 5' 6 (1.676 m)   Wt 235 lb 12.8 oz (107 kg)   SpO2 96%   BMI 38.06 kg/m   SpO2: 96 %  GENERAL: HEAD: Normocephalic, atraumatic.  EYES: Pupils equal, round, reactive to light.  No scleral icterus.  MOUTH:  NECK: Supple. No thyromegaly. Trachea midline. No JVD.  No adenopathy. PULMONARY: Good air entry bilaterally.  No adventitious sounds. CARDIOVASCULAR: S1 and S2. Regular rate and rhythm.  ABDOMEN: MUSCULOSKELETAL: No joint deformity, no clubbing, no edema.  NEUROLOGIC:  SKIN: Intact,warm,dry. PSYCH:  Lab Results  Component Value Date   NITRICOXIDE 10 11/30/2023  *No evidence of type II inflammation       Assessment & Plan:     ICD-10-CM   1. Moderate persistent asthma without complication  J45.40 azithromycin  (ZITHROMAX  Z-PAK) 250 MG tablet    2. Acute bronchitis, unspecified organism  J20.9 DG Chest 2 View    3. Shortness of breath  R06.02 Nitric oxide    DG Chest 2 View      Orders Placed This Encounter  Procedures  . DG Chest 2 View    Standing Status:   Future    Expected Date:   11/30/2023    Expiration Date:   11/29/2024    Reason for Exam (SYMPTOM  OR DIAGNOSIS REQUIRED):   Shortness of brath    Preferred imaging location?:   [facility]  . Nitric oxide    Meds ordered this encounter  Medications  . predniSONE  (DELTASONE ) 20 MG tablet    Sig: Take 1 tablet (20 mg total) by mouth daily with breakfast for 5 days.    Dispense:  5 tablet    Refill:  0  . azithromycin  (ZITHROMAX  Z-PAK) 250 MG tablet    Sig: Take 2 tablets on Day 1 and then 1 tablet daily till gone.    Dispense:  6 each    Refill:  0     Advised if symptoms do not improve or worsen, to please  contact office for sooner follow up or seek emergency care.    I spent xxx minutes of dedicated to the care of this patient on the date of this encounter to include pre-visit review of records, face-to-face time with the patient discussing conditions above, post visit ordering of testing, clinical documentation with the electronic health record, making appropriate referrals as documented, and communicating necessary findings to members of the patients care team.   C. Leita Sanders, MD Advanced Bronchoscopy PCCM [facility]    *This note was dictated using voice recognition software/Dragon.  Despite best efforts to proofread, errors can occur which can change the meaning. Any transcriptional errors that result from this process are unintentional and may not be fully corrected at the time of dictation.

## 2023-11-30 NOTE — Patient Instructions (Signed)
 VISIT SUMMARY:  Today, you were seen for worsening wheezing and breathing difficulties related to your asthma. Despite using your inhalers and nebulizer, your symptoms have not improved. We discussed your current symptoms and reviewed your treatment plan.  YOUR PLAN:  -ASTHMA EXACERBATION: Asthma exacerbation means your asthma symptoms have worsened. We will continue your current inhalers and nebulizer use, and we are adding a short course of prednisone  to help reduce inflammation. A chest x-ray has been ordered to check your lungs.  -POSSIBILITY OF BRONCHITIS: Bronchitis is an inflammation of the bronchial tubes in your lungs, which can cause increased respiratory symptoms. As a precaution, we are prescribing antibiotics to treat any potential infection.  INSTRUCTIONS:  Please get a chest x-ray today. Continue using your Symbicort  and albuterol  inhalers, as well as your nebulizer. Start the prescribed course of prednisone  and antibiotics. Follow up with us  if your symptoms do not improve or if they worsen.

## 2023-12-01 ENCOUNTER — Encounter: Payer: Self-pay | Admitting: Pulmonary Disease

## 2023-12-04 ENCOUNTER — Other Ambulatory Visit: Payer: Self-pay | Admitting: Pulmonary Disease

## 2023-12-04 DIAGNOSIS — J4540 Moderate persistent asthma, uncomplicated: Secondary | ICD-10-CM

## 2023-12-07 ENCOUNTER — Ambulatory Visit (INDEPENDENT_AMBULATORY_CARE_PROVIDER_SITE_OTHER): Admitting: Pulmonary Disease

## 2023-12-07 DIAGNOSIS — R0602 Shortness of breath: Secondary | ICD-10-CM

## 2023-12-07 DIAGNOSIS — J4540 Moderate persistent asthma, uncomplicated: Secondary | ICD-10-CM | POA: Diagnosis not present

## 2023-12-07 LAB — PULMONARY FUNCTION TEST
DL/VA % pred: 161 %
DL/VA: 6.7 ml/min/mmHg/L
DLCO unc % pred: 103 %
DLCO unc: 22.22 ml/min/mmHg
FEF 25-75 Post: 2.07 L/s
FEF 25-75 Pre: 1.45 L/s
FEF2575-%Change-Post: 42 %
FEF2575-%Pred-Post: 83 %
FEF2575-%Pred-Pre: 58 %
FEV1-%Change-Post: 10 %
FEV1-%Pred-Post: 61 %
FEV1-%Pred-Pre: 55 %
FEV1-Post: 1.69 L
FEV1-Pre: 1.53 L
FEV1FVC-%Change-Post: 0 %
FEV1FVC-%Pred-Pre: 102 %
FEV6-%Change-Post: 9 %
FEV6-%Pred-Post: 60 %
FEV6-%Pred-Pre: 55 %
FEV6-Post: 2.09 L
FEV6-Pre: 1.91 L
FEV6FVC-%Pred-Post: 103 %
FEV6FVC-%Pred-Pre: 103 %
FVC-%Change-Post: 9 %
FVC-%Pred-Post: 58 %
FVC-%Pred-Pre: 53 %
FVC-Post: 2.09 L
FVC-Pre: 1.91 L
Post FEV1/FVC ratio: 81 %
Post FEV6/FVC ratio: 100 %
Pre FEV1/FVC ratio: 80 %
Pre FEV6/FVC Ratio: 100 %
RV % pred: 90 %
RV: 1.88 L
TLC % pred: 70 %
TLC: 3.77 L

## 2023-12-07 NOTE — Patient Instructions (Signed)
 Full PFT completed today ? ?

## 2023-12-07 NOTE — Progress Notes (Signed)
 Full PFT completed today ? ?

## 2023-12-09 ENCOUNTER — Ambulatory Visit (INDEPENDENT_AMBULATORY_CARE_PROVIDER_SITE_OTHER): Admitting: Pulmonary Disease

## 2023-12-09 ENCOUNTER — Encounter: Payer: Self-pay | Admitting: Pulmonary Disease

## 2023-12-09 VITALS — BP 118/78 | HR 69 | Temp 97.1°F | Ht 66.0 in | Wt 233.0 lb

## 2023-12-09 DIAGNOSIS — E119 Type 2 diabetes mellitus without complications: Secondary | ICD-10-CM

## 2023-12-09 DIAGNOSIS — J4540 Moderate persistent asthma, uncomplicated: Secondary | ICD-10-CM

## 2023-12-09 DIAGNOSIS — G4733 Obstructive sleep apnea (adult) (pediatric): Secondary | ICD-10-CM

## 2023-12-09 DIAGNOSIS — E039 Hypothyroidism, unspecified: Secondary | ICD-10-CM

## 2023-12-09 DIAGNOSIS — R0602 Shortness of breath: Secondary | ICD-10-CM | POA: Diagnosis not present

## 2023-12-09 DIAGNOSIS — Z7984 Long term (current) use of oral hypoglycemic drugs: Secondary | ICD-10-CM

## 2023-12-09 DIAGNOSIS — K219 Gastro-esophageal reflux disease without esophagitis: Secondary | ICD-10-CM | POA: Diagnosis not present

## 2023-12-09 DIAGNOSIS — Z87891 Personal history of nicotine dependence: Secondary | ICD-10-CM

## 2023-12-09 NOTE — Progress Notes (Signed)
 Synopsis: Referred in by Randeen Laine LABOR, MD   Subjective:   PATIENT ID: Alexa Woods: female DOB: 06/03/1960, MRN: [id number]  Chief Complaint  Patient presents with   Follow-up    SOB, wheezing and cough    HPI Alexa Woods is a pleasant 63 year old female patient with a past medical history of OSA not on CPAP, hypothyroidism, insulin -dependent diabetes mellitus type 2, GERD, moderate persistent asthma presenting today to the pulmonary clinic for ongoing shortness of breath on exertion for the past year.  She reports that since October of last year she has been experiencing worsening shortness of breath on exertion.  Associated with intermittent cough that is dry most of the time.  She does have history of OSA but does not wear CPAP.  She was diagnosed with asthma years ago has not been hospitalized for an asthma exacerbation in the past 3 years.  After reviewing her imaging She had normal chest x-ray in 2022 however in 2025 she had noticeable elevated right hemidiaphragm.  In 2025 she has chronic neck pain and had an MRI of the C-spine years ago that did not show bulging disc/osteophytes.   Labs EOS 231 in 2025  Allergen panel positive for multiple antigens including grass and trees ec..  IgE 30.   PFTs not done yet.   Family history: Mother with COPD was a heavy smoker as well as asthma  Social history: Quit smoking in 2003 smoked 1 pack/day for 30 years.  Lives at home with her husband.  Has 1 dog at home.  OV 10/21/2023 - Patient has been struggling with asthma flare ups for the last 4 months and required one round of prednisone  as outpatinet early may. She saw Dr. Isaiah as an acute visit and continued on prednisone . She does report feeling better today. She is on low dose ICS-LABA and we discussed today need to go up to moderate dose which she is agreable with. We also discussed obtaining PFTs and Split night which she is agreable to both.   OV 12/09/2023 - Ms.  Alexa Woods is here to follow up on her PFTs that show mild restriction but normal DLCO. Some component of reactive airway disease but does not meet ats criteria. She continues with symbicort . Furthermore, her sleep study is consistent with severe OSA and is agreeable to start CPAP. Finally, she will have her sniff test done soon.    ROS All systems were reviewed and are negative except for the above. Objective:   Vitals:   12/09/23 1634  BP: 118/78  Pulse: 69  Temp: (!) 97.1 F (36.2 C)  SpO2: 96%  Weight: 233 lb (105.7 kg)  Height: 5' 6 (1.676 m)   96% on RA BMI Readings from Last 3 Encounters:  12/09/23 37.61 kg/m  12/07/23 37.57 kg/m  11/30/23 38.06 kg/m   Wt Readings from Last 3 Encounters:  12/09/23 233 lb (105.7 kg)  12/07/23 232 lb 12.8 oz (105.6 kg)  11/30/23 235 lb 12.8 oz (107 kg)    Physical Exam GEN: NAD, Healthy Appearing HEENT: Supple Neck, Reactive Pupils, EOMI  CVS: Normal S1, Normal S2, RRR, No murmurs or ES appreciated  Lungs: Diminished at the right base Abdomen: Soft, non tender, non distended, + BS  Extremities: Warm and well perfused, No edema   Labs and imaging were reviewed  Ancillary Information   CBC    Component Value Date/Time   WBC 7.7 08/06/2023 1612   RBC 5.21 (H) 08/06/2023 1612  HGB 13.9 08/06/2023 1612   HGB 13.7 09/18/2020 1553   HCT 43.1 08/06/2023 1612   HCT 39.9 09/18/2020 1553   PLT 277 08/06/2023 1612   PLT 182 09/18/2020 1553   MCV 82.7 08/06/2023 1612   MCV 82 09/18/2020 1553   MCV 82 09/03/2014 1604   MCH 26.7 (L) 08/06/2023 1612   MCHC 32.3 08/06/2023 1612   RDW 13.2 08/06/2023 1612   RDW 12.8 09/18/2020 1553   RDW 13.7 09/03/2014 1604   LYMPHSABS 1.5 07/22/2022 1515   LYMPHSABS 2.0 09/18/2020 1553   LYMPHSABS 2.0 05/26/2013 1844   MONOABS 0.4 07/22/2022 1515   MONOABS 0.4 05/26/2013 1844   EOSABS 231 08/06/2023 1612   EOSABS 0.1 09/18/2020 1553   EOSABS 0.0 05/26/2013 1844   BASOSABS 62 08/06/2023 1612    BASOSABS 0.1 09/18/2020 1553   BASOSABS 0.0 05/26/2013 1844       Latest Ref Rng & Units 12/07/2023    3:41 PM  PFT Results  FVC-Pre L 1.91  P  FVC-Predicted Pre % 53  P  FVC-Post L 2.09  P  FVC-Predicted Post % 58  P  Pre FEV1/FVC % % 80  P  Post FEV1/FCV % % 81  P  FEV1-Pre L 1.53  P  FEV1-Predicted Pre % 55  P  FEV1-Post L 1.69  P  DLCO uncorrected ml/min/mmHg 22.22  P  DLCO UNC% % 103  P  DLVA Predicted % 161  P  TLC L 3.77  P  TLC % Predicted % 70  P  RV % Predicted % 90  P    P Preliminary result     Assessment & Plan:  [name] is a pleasant 63 year old female patient with a past medical history of OSA not on CPAP, hypothyroidism, insulin -dependent diabetes mellitus type 2, GERD, moderate persistent asthma presenting today to the pulmonary clinic for ongoing shortness of breath on exertion for the past year.  Her shortness of breath has been worsening for the past year.  With now noticeable elevated right hemidiaphragm.  Her worsening shortness of breath could be multifactorial in the setting of progressing asthma as well as right hemidiaphragm elevation and sleep apnea.   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  She did have an MRI few years back that showed bulging disc and osteophytes at this level.  PFTs that show mild restriction but normal DLCO. Some component of reactive airway disease but does not meet ats criteria. She continues with symbicort .  Regarding her elevated right hemidiaphragm I will obtain a sniff test to evaluate for right hemidiaphragm paralysis.  Finally she does have a history of OSA with an AHI of 13 years ago but could not afford CPAP at the time.  Repeat sleep study now with Severe OSA and will be starting her on CPAP.   RTC 3 months.   I spent 40 minutes caring for this patient today, including preparing to see the patient, obtaining a medical history , reviewing a separately  obtained history, performing a medically appropriate examination and/or evaluation, counseling and educating the patient/family/caregiver, ordering medications, tests, or procedures, documenting clinical information in the electronic health record, and independently interpreting results (not separately reported/billed) and communicating results to the patient/family/caregiver  Darrin Barn, MD [facility] 12/09/2023 4:41 PM

## 2023-12-16 ENCOUNTER — Ambulatory Visit
Admission: RE | Admit: 2023-12-16 | Discharge: 2023-12-16 | Disposition: A | Discharge status: Home / Self Care | Source: Ambulatory Visit | Attending: Pulmonary Disease | Admitting: Pulmonary Disease

## 2023-12-16 DIAGNOSIS — R9389 Abnormal findings on diagnostic imaging of other specified body structures: Secondary | ICD-10-CM | POA: Diagnosis not present

## 2023-12-16 DIAGNOSIS — J4540 Moderate persistent asthma, uncomplicated: Secondary | ICD-10-CM | POA: Insufficient documentation

## 2023-12-16 DIAGNOSIS — J45909 Unspecified asthma, uncomplicated: Secondary | ICD-10-CM | POA: Diagnosis not present

## 2023-12-18 ENCOUNTER — Other Ambulatory Visit: Payer: Self-pay | Admitting: Family Medicine

## 2023-12-20 ENCOUNTER — Other Ambulatory Visit: Payer: Self-pay | Admitting: Family Medicine

## 2023-12-20 DIAGNOSIS — J4540 Moderate persistent asthma, uncomplicated: Secondary | ICD-10-CM

## 2023-12-20 NOTE — Telephone Encounter (Signed)
 Routing refill request to pt's pulmonologist

## 2023-12-21 ENCOUNTER — Ambulatory Visit: Payer: Self-pay

## 2023-12-21 NOTE — Telephone Encounter (Signed)
 FYI Only or Action Required?: Action required by provider: update on patient condition and request RX.  Patient is followed in Pulmonology for asthma, last seen on 12/09/2023 by Alexa Domino, MD.  Called Nurse Triage reporting Shortness of Breath.  Symptoms began several months ago.  Interventions attempted: Rescue inhaler and Maintenance inhaler.  Symptoms are: unchanged.  Triage Disposition: Call Specialist Today  Patient/caregiver understands and will follow disposition?: No, wishes to speak with PCP      Copied from CRM #[id number]. Topic: Clinical - Red Word Triage >> Dec 21, 2023  2:21 PM Alexa Woods wrote: Red Word that prompted transfer to Nurse Triage: Pt sees [name] and is having issues with breathing, shortness of breath, and a constant cough. Pt has been having these off and on since October. Pt stated she will be prescribed prednisone  and will feel better, but as soon as she's finished with it these symptoms come back. Pt stated she has a bad cold currently. Pt feels like she may have an oxygen issue causing some of the symptoms. Pt does have an appt for 04/03/2024 at 4pm with [name]. Reason for Disposition  Continuous (nonstop) coughing interferes with work, school, or sleeping  Answer Assessment - Initial Assessment Questions E2C2 Pulmonary Triage - Initial Assessment Questions Chief Complaint (e.g., cough, sob, wheezing, fever, chills, sweat or additional symptoms) *Go to specific symptom protocol after initial questions. SOB, cough, sneezing Pt endorses sx improves after prednisone , but then sx return  How long have symptoms been present? October, worsening in February  Have you tested for COVID or Flu? Note: If not, ask patient if a home test can be taken. If so, instruct patient to call back for positive results. No, but does endorse having home test and will check  MEDICINES:   Have you used any OTC meds to help with symptoms? No If yes, ask  What medications? N/a  Have you used your inhalers/maintenance medication? Yes If yes, What medications? Symbicort  - 2 puffs twice daily Albuterol  INH PRN - earlier today with minimal relief DuoNeb PRN  If inhaler, ask How many puffs and how often? Note: Review instructions on medication in the chart. See above  OXYGEN: Do you wear supplemental oxygen? No If yes, How many liters are you supposed to use? N/a  Do you monitor your oxygen levels? No If yes, What is your reading (oxygen level) today? N/a  What is your usual oxygen saturation reading?  (Note: Pulmonary O2 sats should be 90% or greater) N/a      1. RESPIRATORY STATUS: Describe your breathing? (e.g., wheezing, shortness of breath, unable to speak, severe coughing)      See above 2. ONSET: When did this breathing problem begin?      See above 3. PATTERN Does the difficult breathing come and go, or has it been constant since it started?      Endorses good days and bad days 4. SEVERITY: How bad is your breathing? (e.g., mild, moderate, severe)      Mild - mostly with exertion Triager does not appreciate audible SOB/wheezing during call. Pt is speaking in full sentences.  5. RECURRENT SYMPTOM: Have you had difficulty breathing before? If Yes, ask: When was the last time? and What happened that time?      Earlier this month - prednisone  tx 6. CARDIAC HISTORY: Do you have any history of heart disease? (e.g., heart attack, angina, bypass surgery, angioplasty)      denies 7. LUNG HISTORY: Do  you have any history of lung disease?  (e.g., pulmonary embolus, asthma, emphysema)     asthma 8. CAUSE: What do you think is causing the breathing problem?      flare 9. OTHER SYMPTOMS: Do you have any other symptoms? (e.g., chest pain, cough, dizziness, fever, runny nose)     Endorses baseline dizziness, sneezing Denies other sx above 10. O2 SATURATION MONITOR:  Do you use an oxygen  saturation monitor (pulse oximeter) at home? If Yes, ask: What is your reading (oxygen level) today? What is your usual oxygen saturation reading? (e.g., 95%)       N/a 11. PREGNANCY: Is there any chance you are pregnant? When was your last menstrual period?       N/a 12. TRAVEL: Have you traveled out of the country in the last month? (e.g., travel history, exposures)       N/a    Triager attempted schedule visit with [facility] and PCP, but no access. Additionally, pt does endorse limited transportation. Triager will forward encounter for [name] 's office to review and advise. Patient verbalized understanding and is expecting call back from office for next steps. Triager also advised that if pt does not hear back from office, to follow disposition for further evaluation/treatment.  Protocols used: Breathing Difficulty-A-AH

## 2023-12-31 ENCOUNTER — Encounter: Payer: Self-pay | Admitting: Internal Medicine

## 2023-12-31 ENCOUNTER — Ambulatory Visit (INDEPENDENT_AMBULATORY_CARE_PROVIDER_SITE_OTHER): Admitting: Internal Medicine

## 2023-12-31 VITALS — BP 102/56 | HR 65 | Ht 66.0 in | Wt 231.6 lb

## 2023-12-31 DIAGNOSIS — E559 Vitamin D deficiency, unspecified: Secondary | ICD-10-CM | POA: Diagnosis not present

## 2023-12-31 DIAGNOSIS — E1159 Type 2 diabetes mellitus with other circulatory complications: Secondary | ICD-10-CM | POA: Diagnosis not present

## 2023-12-31 DIAGNOSIS — Z794 Long term (current) use of insulin: Secondary | ICD-10-CM | POA: Diagnosis not present

## 2023-12-31 DIAGNOSIS — E785 Hyperlipidemia, unspecified: Secondary | ICD-10-CM | POA: Diagnosis not present

## 2023-12-31 LAB — POCT GLYCOSYLATED HEMOGLOBIN (HGB A1C): Hemoglobin A1C: 9.1 % — AB (ref 4.0–5.6)

## 2023-12-31 NOTE — Patient Instructions (Addendum)
 Please continue: - Farxiga  10 mg before b'fast - Lantus  30 units at bedtime  Use: - R insulin : 15 min before meals   20 units before b'fast  15-18 units before lunch 15-18 units before dinner  If you plan to be more active after a meal, please reduce the insulin  before that meal by 5 units. If you have to take the insulin  after you eat, take only half of the dose.   Please come back for a follow-up appointment in 3 months.
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
## 2024-01-01 ENCOUNTER — Ambulatory Visit: Payer: Self-pay | Admitting: Internal Medicine

## 2024-01-01 LAB — MICROALBUMIN / CREATININE URINE RATIO
Creatinine, Urine: 199 mg/dL (ref 20–275)
Microalb Creat Ratio: 10 mg/g{creat} (ref <30)
Microalb, Ur: 1.9 mg/dL

## 2024-01-04 ENCOUNTER — Encounter: Payer: Self-pay | Admitting: Family Medicine

## 2024-01-04 ENCOUNTER — Ambulatory Visit (INDEPENDENT_AMBULATORY_CARE_PROVIDER_SITE_OTHER): Admitting: Family Medicine

## 2024-01-04 VITALS — BP 118/60 | HR 74 | Temp 97.7°F | Ht 66.0 in | Wt 232.5 lb

## 2024-01-04 DIAGNOSIS — Z79899 Other long term (current) drug therapy: Secondary | ICD-10-CM

## 2024-01-04 DIAGNOSIS — Z5982 Transportation insecurity: Secondary | ICD-10-CM | POA: Diagnosis not present

## 2024-01-04 DIAGNOSIS — E559 Vitamin D deficiency, unspecified: Secondary | ICD-10-CM | POA: Diagnosis not present

## 2024-01-04 DIAGNOSIS — E039 Hypothyroidism, unspecified: Secondary | ICD-10-CM

## 2024-01-04 DIAGNOSIS — R296 Repeated falls: Secondary | ICD-10-CM

## 2024-01-04 DIAGNOSIS — E876 Hypokalemia: Secondary | ICD-10-CM

## 2024-01-04 DIAGNOSIS — R252 Cramp and spasm: Secondary | ICD-10-CM | POA: Diagnosis not present

## 2024-01-04 DIAGNOSIS — I10 Essential (primary) hypertension: Secondary | ICD-10-CM | POA: Diagnosis not present

## 2024-01-04 NOTE — Assessment & Plan Note (Signed)
 D level today

## 2024-01-04 NOTE — Assessment & Plan Note (Signed)
  bp in fair control at this time  BP Readings from Last 1 Encounters:  01/04/24 118/60   No changes needed but will watch for symptoms of hypotension  Most recent labs reviewed  Disc lifstyle change with low sodium diet and exercise  Stable with hctz 25 mg daily  Sees endo for dm2   Taking klor con  Bmet today

## 2024-01-04 NOTE — Assessment & Plan Note (Signed)
 Bmet today Takes klor con 10 meq daily  No missed doses  More muscle cramps

## 2024-01-04 NOTE — Assessment & Plan Note (Signed)
 With more recent muscle cramps  Mag, B12 and D added to labs today

## 2024-01-04 NOTE — Assessment & Plan Note (Signed)
 TSH today  More muscle cramps Taking levothyroxine  75 mcg daily

## 2024-01-04 NOTE — Assessment & Plan Note (Signed)
 These continue  Most recently fell in gravel  Bruises/contusions on left arm and both lower legs  No suspected fractures  Discussed sedating medications Oxycodone  Tizanidine  Ativan   Ambien    Would like to try and get off some of these   Pt denies any physical abuse   Plan to refer to PT for balance/gait training and fall prevention

## 2024-01-04 NOTE — Progress Notes (Signed)
 Subjective:    Patient ID: Alexa Woods, female    DOB: 04-30-1961, 63 y.o.   MRN: [id number]  HPI  Wt Readings from Last 3 Encounters:  01/04/24 232 lb 8 oz (105.5 kg)  12/31/23 231 lb 9.6 oz (105.1 kg)  12/09/23 233 lb (105.7 kg)   37.53 kg/m  Vitals:   01/04/24 1602  BP: 118/60  Pulse: 74  Temp: 97.7 F (36.5 C)  SpO2: 94%   Pt presents for cramps all over (upper body) Also a fall   Worsening cramps - for a week  Hands and arms  Sometimes legs  2 bad episodes lasting a few minutes  Worse in evening   Less activity lately due to lung problems (? Elevated hemidiaph) - spiriva and albuterol  for asthma  Was better when she could walk daily-now does not tolerate    Takes ppi Protonix    Tizanidine  is on med list - takes bid  Oxycodone  is on med list (pain management doctor) -per pt cannot wean that or survive without it Ativan  - takes prn /not that often and never with the oxycodone   Ambien -takes every  night    Fell 2 d ago  Was cleaning - tripped and fell flat forward onto the gravel  Bruised up  Did not cut anything  Right ankle is a little swollen  Bruises on left arm and legs (knees/ shins)   Denies abuse     Lab Results  Component Value Date   NA 144 08/06/2023   K 3.8 08/06/2023   CO2 31 08/06/2023   GLUCOSE 82 08/06/2023   BUN 19 08/06/2023   CREATININE 1.03 08/06/2023   CALCIUM  9.9 08/06/2023   GFR 54.18 (L) 04/21/2023   GFRNONAA >60 06/27/2023   Lab Results  Component Value Date   ALT 23 08/06/2023   AST 23 08/06/2023   ALKPHOS 91 04/12/2023   BILITOT 0.4 08/06/2023   Lab Results  Component Value Date   WBC 7.7 08/06/2023   HGB 13.9 08/06/2023   HCT 43.1 08/06/2023   MCV 82.7 08/06/2023   PLT 277 08/06/2023   Lab Results  Component Value Date   IRON 75 07/22/2022   TIBC 321 03/30/2008   FERRITIN 147.4 08/07/2010   Magnesium  2.3   04/2023   Lab Results  Component Value Date   TSH 4.12 09/21/2023   Levothyroxine  75  mcg daily     HTN bp is stable today  No cp or palpitations or headaches or edema  No side effects to medicines  BP Readings from Last 3 Encounters:  01/04/24 118/60  12/31/23 (!) 102/56  12/09/23 118/78     Lab Results  Component Value Date   NA 144 08/06/2023   K 3.8 08/06/2023   CO2 31 08/06/2023   GLUCOSE 82 08/06/2023   BUN 19 08/06/2023   CREATININE 1.03 08/06/2023   CALCIUM  9.9 08/06/2023   GFR 54.18 (L) 04/21/2023   GFRNONAA >60 06/27/2023    Klor con 10 meq daily  No missed doses    Some constipation     Patient Active Problem List   Diagnosis Date Noted   Muscle cramps 01/04/2024   SOB (shortness of breath) on exertion 07/19/2023   CAD (coronary artery disease) 04/21/2023   Chest pain 04/13/2023   Right shoulder injury 03/16/2023   Pedal edema 01/01/2023   Frequent falls 08/14/2022   Statin myopathy 07/29/2022   Itching 07/29/2022   Aortic atherosclerosis (HCC) 07/29/2022   Chronic pain  02/10/2022   Transportation insecurity 05/09/2021   Financial insecurity 05/09/2021   Colon cancer screening 11/08/2020   Current use of proton pump inhibitor 11/04/2020   History of total knee replacement, right 10/21/2020   Trigger finger of right hand 12/20/2019   Dry eyes 09/05/2019   Intractable headache 08/25/2019   Hypothyroidism 04/04/2019   Screening mammogram, encounter for 04/03/2019   Routine general medical examination at a health care facility 04/03/2019   Encounter for screening for HIV 04/03/2019   Vitamin D  deficiency 04/03/2019   Intertrigo 02/21/2019   Chronic constipation 12/26/2017   History of left knee replacement 12/26/2017   Primary osteoarthritis involving multiple joints 12/13/2017   Primary osteoarthritis of left knee 12/06/2017   Chronic neck pain 08/13/2017   Pain in left knee 06/22/2017   Morbid obesity (HCC) 01/21/2017   Joint swelling 12/20/2013   Periodontal disease 02/03/2013   Hyperlipidemia associated with type 2  diabetes mellitus (HCC) 09/27/2012   Cough 03/04/2011   Low back pain 07/22/2010   Epigastric pain 04/18/2010   Hypokalemia 12/12/2008   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   Type 2 diabetes mellitus with peripheral neuropathy (HCC) 11/30/2006   History of alcohol abuse 11/30/2006   Depression with anxiety 11/30/2006   Essential hypertension 11/30/2006   Hemorrhoids 11/30/2006   Allergic rhinitis 11/30/2006   Moderate asthma 11/30/2006   GERD without esophagitis 11/30/2006   Psoriasis 11/30/2006   Past Medical History:  Diagnosis Date   Alcohol abuse, in remission    since 1998   Allergic rhinitis    Anemia    Anxiety    Asthma    Bilateral lower extremity edema    Bulging lumbar disc    Bulging of cervical intervertebral disc    Chronic constipation    DDD (degenerative disc disease), lumbosacral    Depression    Dyspnea    Gastroparesis    GERD (gastroesophageal reflux disease)    Hemorrhoids    History of Bell's palsy 08/2007   left   History of chronic cystitis    IBS (irritable bowel syndrome)    Insulin  dependent type 2 diabetes mellitus [facility])    endocrinologist-- dr trixie   Lower urinary tract symptoms (LUTS)    OA (osteoarthritis)    knees, back, hands, elbows   OSA (obstructive sleep apnea)    per study 06-08-2017 mild osa , cpap recommended , per pt insurance issue   Peripheral neuropathy    PONV (postoperative nausea and vomiting)    Psoriasis    S/P dilatation of esophageal stricture    Unspecified essential hypertension    Wears partial dentures    lower   Past Surgical History:  Procedure Laterality Date   CHOLECYSTECTOMY OPEN  1990   AND APPENDECTOMY   DOBUTAMINE  STRESS ECHO  2009    normal stress echo, no evidence of ischemia   ELBOW SURGERY Bilateral RIGHT 2013;  LEFT 2016   for nerve damage   ESOPHAGOGASTRODUODENOSCOPY  07/2002   erythematous gastropathy   eye sugery Bilateral    KNEE ARTHROSCOPY Left 11/ 2018   dr melodi  @ [facility]    KNEE ARTHROSCOPY W/ PARTIAL MEDIAL MENISCECTOMY Right 10/2008   and chondroplasty   SHOULDER ARTHROSCOPY WITH DISTAL CLAVICLE RESECTION Left 12/ 2011   dr dean   TOTAL KNEE ARTHROPLASTY Right 07-01-2009  dr melodi   [facility]   TOTAL KNEE ARTHROPLASTY Left 12/06/2017   Procedure: LEFT TOTAL LEFT KNEE ARTHROPLASTY;  Surgeon: melodi Lerner, MD;  Location: [facility];  Service: Orthopedics;  Laterality: Left;   Trigger finger surgery Left 07/2022   VAGINAL HYSTERECTOMY  2003   Social History   Tobacco Use   Smoking status: Former    Current packs/day: 0.00    Average packs/day: 1 pack/day for 25.0 years (25.0 ttl pk-yrs)    Types: Cigarettes    Start date: 12/30/1976    Quit date: 12/30/2001    Years since quitting: 22.0   Smokeless tobacco: Never  Vaping Use   Vaping status: Never Used  Substance Use Topics   Alcohol use: No    Alcohol/week: 0.0 standard drinks of alcohol    Comment: hx alcoholism--  stopped 1998    Drug use: No   Family History  Problem Relation Age of Onset   Alcohol abuse Mother    Hypertension Mother    Diabetes Mother    Heart failure Mother    Diabetes Sister    Heart attack Sister    Diabetes Sister    Diabetes Sister    Diabetes Sister    Celiac disease Daughter    Esophageal cancer Maternal Grandmother    Diabetes Brother    Parkinson's disease Brother    Heart attack Brother    Heart attack Brother 28   Diabetes Brother    Diabetes Brother    Diabetes Brother    Thyroid  disease Niece    Lung cancer Other        mat great uncle   Colon cancer Neg Hx    Breast cancer Neg Hx    Allergies  Allergen Reactions   Bupropion Hcl Other (See Comments)    Pt is unsure   Cefuroxime Axetil Nausea Only   Crestor  [Rosuvastatin  Calcium ]     Pt states it makes her feel weird    Gabapentin  Other (See Comments)    Pt does not remember reaction  Pt does not remember reaction    Lidocaine Other (See Comments)    REACTION: unknown REACTION: unknown    Metformin Other (See Comments)    REACTION: GI REACTION: GI   Other Other (See Comments)   Paroxetine Other (See Comments)    REACTION: doesn't agree REACTION: doesn't agree   Propoxyphene Other (See Comments)    wheezing wheezing   Tramadol Other (See Comments)    REACTION: Causes Anxiety   Tramadol Hcl     REACTION: Causes Anxiety   Wellbutrin [Bupropion] Other (See Comments)    Pt is unsure   Codeine Nausea And Vomiting and Rash   Sulfa Antibiotics Rash and Other (See Comments)   Current Outpatient Medications on File Prior to Visit  Medication Sig Dispense Refill   albuterol  (VENTOLIN  HFA) 108 (90 Base) MCG/ACT inhaler INHALE 2 PUFFS INTO THE LUNGS EVERY 4 HOURS AS NEEDED FOR WHEEZING OR SHORTNESS OF BREATH. 18 each 2   B-D UF III MINI PEN NEEDLES 31G X 5 MM MISC USE AS INSTRUCTED FOR 4X DAILY INJECTIONS 400 each 1   BD INSULIN  SYRINGE U/F 31G X 5/16 0.5 ML MISC USE THREE TIMES PER DAY WITH R INSULIN  & ONCE DAILY WITH LEVEMIR  400 each 2   budesonide -formoterol  (SYMBICORT ) 160-4.5 MCG/ACT inhaler Inhale 2 puffs into the lungs in the morning and at bedtime. 1 each 12   cholecalciferol (VITAMIN D3) 25 MCG (1000 UNIT) tablet Take 2,000 Units by mouth daily.     docusate sodium  (COLACE) 100 MG capsule Take 100 mg by mouth every morning.     ezetimibe  (ZETIA ) 10  MG tablet TAKE 1 TABLET BY MOUTH EVERY DAY 90 tablet 1   famotidine  (PEPCID ) 20 MG tablet Take 1 tablet (20 mg total) by mouth 2 (two) times daily. 180 tablet 2   FARXIGA  5 MG TABS tablet TAKE 1 TABLET BY MOUTH DAILY BEFORE BREAKFAST. 90 tablet 3   fluticasone  (FLONASE ) 50 MCG/ACT nasal spray PLACE 2 SPRAYS INTO BOTH NOSTRILS DAILY AS NEEDED FOR ALLERGIES OR RHINITIS. 48 mL 1   glucose blood (ACCU-CHEK GUIDE TEST) test strip Use to check blood sugar 4-5 times a day. 500 each 3   hydrochlorothiazide  (HYDRODIURIL ) 25 MG tablet TAKE 1 TABLET (25 MG TOTAL) BY MOUTH DAILY. 90 tablet 1   insulin  glargine (LANTUS  SOLOSTAR) 100  UNIT/ML Solostar Pen INJECT 30-35 UNITS INTO THE SKIN DAILY. 30 mL 3   Insulin  Pen Needle 32G X 4 MM MISC Use 4x a day 300 each 3   Insulin  Regular Human (NOVOLIN  R FLEXPEN RELION) 100 UNIT/ML KwikPen Inject 18-25 units under skin before each meal 45 mL 3   ipratropium-albuterol  (DUONEB) 0.5-2.5 (3) MG/3ML SOLN Take 3 mLs by nebulization every 4 (four) hours as needed. 360 mL 12   ketoconazole  (NIZORAL ) 2 % cream APPLY 1 APPLICATION TOPICALLY DAILY AS NEEDED FOR IRRITATION. 30 g 1   KLOR-CON  M10 10 MEQ tablet TAKE 1 TABLET BY MOUTH EVERY DAY 90 tablet 1   Lancets (ONETOUCH ULTRASOFT) lancets Use to test blood sugar 4 times daily as instructed. 200 each 11   levothyroxine  (SYNTHROID ) 75 MCG tablet Take 1 tablet (75 mcg total) by mouth daily. 90 tablet 3   LORazepam  (ATIVAN ) 2 MG tablet Take 2 mg by mouth 2 (two) times daily.     naloxone (NARCAN) 4 MG/0.1ML LIQD nasal spray kit      oxyCODONE -acetaminophen  (PERCOCET) 10-325 MG tablet Take 1 tablet by mouth every 4 (four) hours as needed for pain. Hold for SBP < = 105 12 tablet 0   pantoprazole  (PROTONIX ) 40 MG tablet Take 40 mg by mouth daily. In am 30 min before food or medication or vitamins     sertraline  (ZOLOFT ) 100 MG tablet Take 200 mg by mouth every morning.     Spacer/Aero-Holding Chambers (OPTICHAMBER DIAMOND) MISC USE AS INSTRUCTED 1 each 0   tiZANidine  (ZANAFLEX ) 4 MG tablet Take 4 mg by mouth 2 (two) times daily as needed for muscle spasms.     zolpidem  (AMBIEN ) 10 MG tablet Take 10 mg by mouth at bedtime as needed for sleep.     No current facility-administered medications on file prior to visit.    Review of Systems  Constitutional:  Positive for fatigue. Negative for activity change, appetite change, fever and unexpected weight change.  HENT:  Negative for congestion, ear pain, rhinorrhea, sinus pressure and sore throat.   Eyes:  Positive for visual disturbance. Negative for pain and redness.       Diabetic retinopathy   Respiratory:  Negative for cough, shortness of breath and wheezing.   Cardiovascular:  Negative for chest pain and palpitations.  Gastrointestinal:  Negative for abdominal pain, blood in stool, constipation and diarrhea.  Endocrine: Negative for polydipsia and polyuria.  Genitourinary:  Negative for dysuria, frequency and urgency.  Musculoskeletal:  Positive for arthralgias, back pain, myalgias and neck pain. Negative for joint swelling.  Skin:  Negative for pallor and rash.  Allergic/Immunologic: Negative for environmental allergies.  Neurological:  Negative for dizziness, tremors, seizures, syncope, facial asymmetry, speech difficulty, weakness, light-headedness, numbness and headaches.  Poor balance w/o dizziness   Hematological:  Negative for adenopathy. Does not bruise/bleed easily.  Psychiatric/Behavioral:  Negative for decreased concentration and dysphoric mood. The patient is not nervous/anxious.        Objective:   Physical Exam Constitutional:      General: She is not in acute distress.    Appearance: Normal appearance. She is well-developed. She is not ill-appearing or diaphoretic.  HENT:     Head: Normocephalic and atraumatic.     Mouth/Throat:     Mouth: Mucous membranes are moist.     Pharynx: Oropharynx is clear.  Eyes:     General:        Right eye: No discharge.        Left eye: No discharge.     Conjunctiva/sclera: Conjunctivae normal.     Pupils: Pupils are equal, round, and reactive to light.  Neck:     Thyroid : No thyromegaly.     Vascular: No carotid bruit or JVD.  Cardiovascular:     Rate and Rhythm: Normal rate and regular rhythm.     Heart sounds: Normal heart sounds.     No gallop.  Pulmonary:     Effort: Pulmonary effort is normal. No respiratory distress.     Breath sounds: Normal breath sounds. No wheezing or rales.  Abdominal:     General: There is no distension or abdominal bruit.     Palpations: Abdomen is soft.  Musculoskeletal:      Cervical back: Normal range of motion and neck supple.     Right lower leg: Edema present.     Left lower leg: Edema present.     Comments: Trace ankle edema  Some bruising on right knee with normal rom  Old bruises on left upper arm  Limited rom of right shoulder due to old injury   Slow gait   Lymphadenopathy:     Cervical: No cervical adenopathy.  Skin:    General: Skin is warm and dry.     Coloration: Skin is not jaundiced or pale.     Findings: Bruising present. No rash.  Neurological:     Mental Status: She is alert.     Cranial Nerves: No cranial nerve deficit.     Motor: No weakness.     Coordination: Coordination normal.     Deep Tendon Reflexes: Reflexes are normal and symmetric. Reflexes normal.     Comments: Slow gait  Generally poor balance  No focal deficits   Psychiatric:        Mood and Affect: Mood is depressed.     Comments: Candidly discusses symptoms and stressors             Assessment & Plan:   Problem List Items Addressed This Visit       Cardiovascular and Mediastinum   Essential hypertension    bp in fair control at this time  BP Readings from Last 1 Encounters:  01/04/24 118/60   No changes needed but will watch for symptoms of hypotension  Most recent labs reviewed  Disc lifstyle change with low sodium diet and exercise  Stable with hctz 25 mg daily  Sees endo for dm2   Taking klor con  Bmet today       Relevant Orders   Comprehensive metabolic panel with GFR   CBC with Differential/Platelet   TSH     Endocrine   Hypothyroidism   TSH today  More muscle cramps Taking levothyroxine  75 mcg daily  Relevant Orders   TSH     Other   Vitamin D  deficiency   D level today      Relevant Orders   VITAMIN D  25 Hydroxy (Vit-D Deficiency, Fractures)   Transportation insecurity   Referral to community care Husband has to drive her / he is alcoholic and not always safe to drive  Asked pt to disconnect mychart so she will  get a call for this (no computer to use mychart)       Relevant Orders   AMB Referral [facility] Care Management   Muscle cramps - Primary   Likely multi factorial  Worse in evenings  UEs and LEs   Pt has fibromyalgia Also polypharmacy Hypothyroid  Hypokalemia  Takes ppi  Labs pending  Encouraged more physical activity and stretching if able   Call back and Er precautions noted in detail today        Relevant Orders   Comprehensive metabolic panel with GFR   CBC with Differential/Platelet   Magnesium    TSH   VITAMIN D  25 Hydroxy (Vit-D Deficiency, Fractures)   Vitamin B12   CK   Hypokalemia   Bmet today Takes klor con 10 meq daily  No missed doses  More muscle cramps       Relevant Orders   Comprehensive metabolic panel with GFR   Frequent falls   These continue  Most recently fell in gravel  Bruises/contusions on left arm and both lower legs  No suspected fractures  Discussed sedating medications Oxycodone  Tizanidine  Ativan   Ambien    Would like to try and get off some of these   Pt denies any physical abuse   Plan to refer to PT for balance/gait training and fall prevention        Relevant Orders   Ambulatory referral to Physical Therapy   Current use of proton pump inhibitor   With more recent muscle cramps  Mag, B12 and D added to labs today      Relevant Orders   CBC with Differential/Platelet   Magnesium    Vitamin B12

## 2024-01-04 NOTE — Assessment & Plan Note (Signed)
 Likely multi factorial  Worse in evenings  UEs and LEs   Pt has fibromyalgia Also polypharmacy Hypothyroid  Hypokalemia  Takes ppi  Labs pending  Encouraged more physical activity and stretching if able   Call back and Er precautions noted in detail today

## 2024-01-04 NOTE — Assessment & Plan Note (Signed)
 Referral to community care Husband has to drive her / he is alcoholic and not always safe to drive  Asked pt to disconnect mychart so she will get a call for this (no computer to use mychart)

## 2024-01-04 NOTE — Patient Instructions (Addendum)
 Talk to your psychiatrist  Let them know you are falling frequently and there is concern about sedating medication  Ativan  and ambien  are 2 that can increase fall risk   Please stop the tizanidine  (muscle relaxer) -and see if this helps prevent falls   Use your walker to prevent falls   I will send a referral for  Social work- to help you with transportation  Physical therapy-to help you with fall prevention/balance and strength  Keep your phone on you and functional - if you don't hear from someone in 1-2 weeks let us  know   For cramps - I did labs today and we will reach out with results when they come back    Please have the staff de activate your mychart account since you can't use it

## 2024-01-05 ENCOUNTER — Telehealth: Payer: Self-pay

## 2024-01-05 ENCOUNTER — Ambulatory Visit: Payer: Self-pay | Admitting: Family Medicine

## 2024-01-05 LAB — COMPREHENSIVE METABOLIC PANEL WITH GFR
ALT: 19 U/L (ref 0–35)
AST: 17 U/L (ref 0–37)
Albumin: 4 g/dL (ref 3.5–5.2)
Alkaline Phosphatase: 110 U/L (ref 39–117)
BUN: 16 mg/dL (ref 6–23)
CO2: 33 meq/L — ABNORMAL HIGH (ref 19–32)
Calcium: 9 mg/dL (ref 8.4–10.5)
Chloride: 97 meq/L (ref 96–112)
Creatinine, Ser: 1.15 mg/dL (ref 0.40–1.20)
GFR: 51.67 mL/min — ABNORMAL LOW (ref >60.00)
Glucose, Bld: 419 mg/dL — ABNORMAL HIGH (ref 70–99)
Potassium: 3.7 meq/L (ref 3.5–5.1)
Sodium: 138 meq/L (ref 135–145)
Total Bilirubin: 0.4 mg/dL (ref 0.2–1.2)
Total Protein: 6.7 g/dL (ref 6.0–8.3)

## 2024-01-05 LAB — CBC WITH DIFFERENTIAL/PLATELET
Basophils Absolute: 0 K/uL (ref 0.0–0.1)
Basophils Relative: 0.6 % (ref 0.0–3.0)
Eosinophils Absolute: 0.1 K/uL (ref 0.0–0.7)
Eosinophils Relative: 1.4 % (ref 0.0–5.0)
HCT: 39.5 % (ref 36.0–46.0)
Hemoglobin: 12.9 g/dL (ref 12.0–15.0)
Lymphocytes Relative: 17.7 % (ref 12.0–46.0)
Lymphs Abs: 1.3 K/uL (ref 0.7–4.0)
MCHC: 32.6 g/dL (ref 30.0–36.0)
MCV: 83.7 fl (ref 78.0–100.0)
Monocytes Absolute: 0.5 K/uL (ref 0.1–1.0)
Monocytes Relative: 6.2 % (ref 3.0–12.0)
Neutro Abs: 5.6 K/uL (ref 1.4–7.7)
Neutrophils Relative %: 74.1 % (ref 43.0–77.0)
Platelets: 209 K/uL (ref 150.0–400.0)
RBC: 4.73 Mil/uL (ref 3.87–5.11)
RDW: 14.5 % (ref 11.5–15.5)
WBC: 7.5 K/uL (ref 4.0–10.5)

## 2024-01-05 LAB — VITAMIN B12: Vitamin B-12: 316 pg/mL (ref 211–911)

## 2024-01-05 LAB — VITAMIN D 25 HYDROXY (VIT D DEFICIENCY, FRACTURES): VITD: 25.94 ng/mL — ABNORMAL LOW (ref 30.00–100.00)

## 2024-01-05 LAB — CK: Total CK: 178 U/L — ABNORMAL HIGH (ref 17–177)

## 2024-01-05 LAB — TSH: TSH: 4.33 u[IU]/mL (ref 0.35–5.50)

## 2024-01-05 LAB — MAGNESIUM: Magnesium: 2 mg/dL (ref 1.5–2.5)

## 2024-01-05 NOTE — Progress Notes (Signed)
 Complex Care Management Note Care Guide Note  01/05/2024 Name: Alexa Woods MRN: [id number] DOB: 08/06/1960   Complex Care Management Outreach Attempts: An unsuccessful telephone outreach was attempted today to offer the patient information about available complex care management services.  Follow Up Plan:  Additional outreach attempts will be made to offer the patient complex care management information and services.   Encounter Outcome:  No Answer  Lyrik Dockstader [facility]  [facility] Guide Direct Dial: [phone]  Fax: [phone] Website: [email or web address]

## 2024-01-06 ENCOUNTER — Telehealth: Payer: Self-pay

## 2024-01-06 NOTE — Progress Notes (Signed)
 Complex Care Management Note Care Guide Note  01/06/2024 Name: Alexa Woods MRN: [id number] DOB: 04-Sep-1960   Complex Care Management Outreach Attempts: A second unsuccessful outreach was attempted today to offer the patient with information about available complex care management services.  Follow Up Plan:  Additional outreach attempts will be made to offer the patient complex care management information and services.   Encounter Outcome:  No Answer  Pookela Sellin [facility]  [facility] Guide Direct Dial: [phone]  Fax: [phone] Website: [email or web address]

## 2024-01-09 ENCOUNTER — Other Ambulatory Visit: Payer: Self-pay | Admitting: Pulmonary Disease

## 2024-01-09 DIAGNOSIS — J4540 Moderate persistent asthma, uncomplicated: Secondary | ICD-10-CM

## 2024-01-10 ENCOUNTER — Telehealth: Payer: Self-pay

## 2024-01-10 NOTE — Progress Notes (Signed)
 Complex Care Management Note Care Guide Note  01/10/2024 Name: Alexa Woods MRN: [id number] DOB: 27-Aug-1960   Complex Care Management Outreach Attempts: A third unsuccessful outreach was attempted today to offer the patient with information about available complex care management services.  Follow Up Plan:  No further outreach attempts will be made at this time. We have been unable to contact the patient to offer or enroll patient in complex care management services.  Encounter Outcome:  No Answer  Aarica Wax [facility]  [facility] Guide Direct Dial: [phone]  Fax: [phone] Website: [email or web address]

## 2024-01-13 ENCOUNTER — Other Ambulatory Visit: Payer: Self-pay

## 2024-01-13 ENCOUNTER — Ambulatory Visit: Payer: Self-pay | Admitting: Family Medicine

## 2024-01-13 ENCOUNTER — Emergency Department
Admission: EM | Admit: 2024-01-13 | Discharge: 2024-01-13 | Disposition: A | Discharge status: Home / Self Care | Attending: Emergency Medicine | Admitting: Emergency Medicine

## 2024-01-13 ENCOUNTER — Emergency Department

## 2024-01-13 DIAGNOSIS — J45909 Unspecified asthma, uncomplicated: Secondary | ICD-10-CM | POA: Diagnosis not present

## 2024-01-13 DIAGNOSIS — R0602 Shortness of breath: Secondary | ICD-10-CM | POA: Diagnosis present

## 2024-01-13 DIAGNOSIS — R9389 Abnormal findings on diagnostic imaging of other specified body structures: Secondary | ICD-10-CM | POA: Diagnosis not present

## 2024-01-13 DIAGNOSIS — I700 Atherosclerosis of aorta: Secondary | ICD-10-CM | POA: Diagnosis not present

## 2024-01-13 DIAGNOSIS — I771 Stricture of artery: Secondary | ICD-10-CM | POA: Diagnosis not present

## 2024-01-13 DIAGNOSIS — Z8709 Personal history of other diseases of the respiratory system: Secondary | ICD-10-CM

## 2024-01-13 LAB — CBC
HCT: 39.6 % (ref 36.0–46.0)
Hemoglobin: 12.9 g/dL (ref 12.0–15.0)
MCH: 27.7 pg (ref 26.0–34.0)
MCHC: 32.6 g/dL (ref 30.0–36.0)
MCV: 85.2 fL (ref 80.0–100.0)
Platelets: 255 K/uL (ref 150–400)
RBC: 4.65 MIL/uL (ref 3.87–5.11)
RDW: 14.1 % (ref 11.5–15.5)
WBC: 9.5 K/uL (ref 4.0–10.5)
nRBC: 0 % (ref 0.0–0.2)

## 2024-01-13 LAB — BASIC METABOLIC PANEL WITH GFR
Anion gap: 9 (ref 5–15)
BUN: 18 mg/dL (ref 6–20)
CO2: 28 mmol/L (ref 22–32)
Calcium: 9.3 mg/dL (ref 8.9–10.3)
Chloride: 101 mmol/L (ref 98–111)
Creatinine, Ser: 1.11 mg/dL — ABNORMAL HIGH (ref 0.44–1.00)
GFR, Estimated: 57 mL/min — ABNORMAL LOW (ref >60)
Glucose, Bld: 168 mg/dL — ABNORMAL HIGH (ref 70–99)
Potassium: 3.4 mmol/L — ABNORMAL LOW (ref 3.5–5.1)
Sodium: 138 mmol/L (ref 135–145)

## 2024-01-13 LAB — TROPONIN I (HIGH SENSITIVITY)
Troponin I (High Sensitivity): 5 ng/L (ref <18)
Troponin I (High Sensitivity): 4 ng/L (ref <18)

## 2024-01-13 NOTE — ED Triage Notes (Signed)
 Pt sts that she has been SOB for the last yr. Pt sts that sees a pulmonologist about this issue. Pt sts that she has just been getting more SOB as the days have been progressing.

## 2024-01-13 NOTE — Telephone Encounter (Signed)
 Noted

## 2024-01-13 NOTE — Discharge Instructions (Addendum)
 [facility] [employer] [address] [place], [place], [place] [address] [phone]

## 2024-01-13 NOTE — Telephone Encounter (Signed)
 FYI Only or Action Required?: FYI only for provider.  Patient was last seen in primary care on 01/04/2024 by Randeen Laine LABOR, MD.  Called Nurse Triage reporting Shortness of Breath.  Symptoms began several days ago.  Symptoms are: gradually worsening.  Triage Disposition: Go to ED Now (Notify PCP)  Patient/caregiver understands and will follow disposition?: Yes            Copied from CRM [id number]. Topic: Clinical - Red Word Triage >> Jan 13, 2024  1:05 PM Corean SAUNDERS wrote: Red Word that prompted transfer to Nurse Triage: Worsening trouble breathing. Reason for Disposition  [1] MODERATE difficulty breathing (e.g., speaks in phrases, SOB even at rest, pulse 100-120) AND [2] NEW-onset or WORSE than normal  Answer Assessment - Initial Assessment Questions This RN recommends pt goes to ED and has another adult drive her there. Pt states her husband gets off work at 3 PM and she is going to ask him to take her. This RN educated pt on importance of being seen ASAP. This RN offered to call pt an ambulance but pt refused. This RN told pt to call 911 if needed.    RESPIRATORY STATUS: Describe your breathing? (e.g., wheezing, shortness of breath, unable to speak, severe coughing)      SOB, coughing, wheezing  ONSET: When did this breathing problem begin?      I've been sick for a good while; this episode started a few days ago and its getting worse  SEVERITY: How bad is your breathing? (e.g., mild, moderate, severe)      Yesterday was terrible per pt; coughing so bad and couldn't breathe  CAUSE: What do you think is causing the breathing problem?   OTHER SYMPTOMS: Do you have any other symptoms? (e.g., chest pain, cough, dizziness, fever, runny nose)     Sometimes dizzy, a little bit of chest pain in middle of chest (not currently) with asthma attack  O2 SATURATION MONITOR:  Do you use an oxygen saturation monitor (pulse oximeter) at home? If Yes, ask: What is your  reading (oxygen level) today? What is your usual oxygen saturation reading? (e.g., 95%)       No way to check; does not use oxygen  Protocols used: Breathing Difficulty-A-AH

## 2024-01-13 NOTE — ED Provider Notes (Signed)
 [facility] Provider Note   Event Date/Time   First MD Initiated Contact with Patient 01/13/24 2236     (approximate) History  Shortness of Breath  HPI Alexa Woods is a 63 y.o. female with a past medical history of asthma who presents complaining of worsening shortness of breath and wheezing over the last few days.  Patient states that she has chronic shortness of breath and wheezing however it has been worsening over the past few days with associated sore throat and hoarse voice.  Patient states that this happens whenever she has an asthma exacerbation.  Patient has been using her home nebulizer and rescue inhaler as needed with little effect.  Patient denies any recent travel, sick contacts, or change in her medications recently ROS: Patient currently denies any vision changes, tinnitus, difficulty speaking, facial droop, sore throat, chest pain, abdominal pain, nausea/vomiting/diarrhea, dysuria, or weakness/numbness/paresthesias in any extremity   Physical Exam  Triage Vital Signs: ED Triage Vitals  Encounter Vitals Group     BP 01/13/24 1712 102/66     Girls Systolic BP Percentile --      Girls Diastolic BP Percentile --      Boys Systolic BP Percentile --      Boys Diastolic BP Percentile --      Pulse Rate 01/13/24 1712 70     Resp 01/13/24 1712 17     Temp 01/13/24 1712 (!) 97.5 F (36.4 C)     Temp Source 01/13/24 1712 Oral     SpO2 01/13/24 1712 96 %     Weight 01/13/24 1710 233 lb (105.7 kg)     Height 01/13/24 1710 5' 6 (1.676 m)     Head Circumference --      Peak Flow --      Pain Score 01/13/24 1710 1     Pain Loc --      Pain Education --      Exclude from Growth Chart --    Most recent vital signs: Vitals:   01/13/24 1712 01/13/24 2126  BP: 102/66 138/75  Pulse: 70 63  Resp: 17 18  Temp: (!) 97.5 F (36.4 C) 98 F (36.7 C)  SpO2: 96% 98%   General: Awake, oriented x4. CV:  Good peripheral perfusion. Resp:  Normal effort.   No wheezing appreciated Abd:  No distention. Other:  Middle-aged obese Caucasian female resting comfortably in no acute distress ED Results / Procedures / Treatments  Labs (all labs ordered are listed, but only abnormal results are displayed) Labs Reviewed  BASIC METABOLIC PANEL WITH GFR - Abnormal; Notable for the following components:      Result Value   Potassium 3.4 (*)    Glucose, Bld 168 (*)    Creatinine, Ser 1.11 (*)    GFR, Estimated 57 (*)    All other components within normal limits  CBC  TROPONIN I (HIGH SENSITIVITY)  TROPONIN I (HIGH SENSITIVITY)   EKG ED ECG REPORT I, Artist MARLA Kerns, the attending physician, personally viewed and interpreted this ECG. Date: 01/13/2024 EKG Time: 1712 Rate: 68 Rhythm: normal sinus rhythm QRS Axis: normal Intervals: normal ST/T Wave abnormalities: normal Narrative Interpretation: no evidence of acute ischemia RADIOLOGY ED MD interpretation: 2 view chest x-ray interpreted by me shows no evidence of acute abnormalities including no pneumonia, pneumothorax, or widened mediastinum - All radiology independently interpreted and agree with radiology assessment Official radiology report(s): DG Chest 2 View Result Date: 01/13/2024 CLINICAL DATA:  SOB  EXAM: CHEST - 2 VIEW COMPARISON:  12/09/23 FINDINGS: Similar pronounced elevation of the right hemidiaphragm with right basilar atelectasis. No focal airspace consolidation, pleural effusion, or pneumothorax. No cardiomegaly. Tortuous aorta with aortic atherosclerosis. No acute fracture or destructive lesions. Multilevel thoracolumbar osteophytosis. IMPRESSION: No acute cardiopulmonary abnormality. Electronically Signed   By: Rogelia Myers M.D.   On: 01/13/2024 18:43   PROCEDURES: Critical Care performed: No Procedures MEDICATIONS ORDERED IN ED: Medications - No data to display IMPRESSION / MDM / ASSESSMENT AND PLAN / ED COURSE  I reviewed the triage vital signs and the nursing notes.                              The patient is on the cardiac monitor to evaluate for evidence of arrhythmia and/or significant heart rate changes. Patient's presentation is most consistent with acute presentation with potential threat to life or bodily function. Presentation most consistent with stable asthma  Presentation less concerning for pneumonia, heart failure, foreign body airway obstruction, pulmonary embolism, tamponade, atypical ACS  Reassuring factors: No AMS, silent respirations, belly-breathing, or other sign of impending ventilatory failure.  Workup BMP, CBC, and troponin negative for acute abnormalities.  Chest x-ray shows no evidence of acute abnormalities Patient was prescribed a CPAP machine that she has not yet gotten and therefore I have provided her with a local durable medical equipment store including address and phone number to acquire a CPAP machine Disposition: Discharge home with return precautions. Advised to follow up with primary care physician/pulmonologist within next 24-48 hours.   FINAL CLINICAL IMPRESSION(S) / ED DIAGNOSES   Final diagnoses:  Shortness of breath  History of asthma   Rx / DC Orders   ED Discharge Orders          Ordered    For home use only DME continuous positive airway pressure (CPAP)        01/13/24 2250           Note:  This document was prepared using Dragon voice recognition software and may include unintentional dictation errors.   Bijon Mineer K, MD 01/13/24 [phone]

## 2024-01-19 DIAGNOSIS — M79641 Pain in right hand: Secondary | ICD-10-CM | POA: Diagnosis not present

## 2024-01-19 DIAGNOSIS — M79642 Pain in left hand: Secondary | ICD-10-CM | POA: Diagnosis not present

## 2024-01-19 DIAGNOSIS — Z5181 Encounter for therapeutic drug level monitoring: Secondary | ICD-10-CM | POA: Diagnosis not present

## 2024-01-19 DIAGNOSIS — Z79899 Other long term (current) drug therapy: Secondary | ICD-10-CM | POA: Diagnosis not present

## 2024-01-19 DIAGNOSIS — Z9189 Other specified personal risk factors, not elsewhere classified: Secondary | ICD-10-CM | POA: Diagnosis not present

## 2024-01-19 DIAGNOSIS — M5416 Radiculopathy, lumbar region: Secondary | ICD-10-CM | POA: Diagnosis not present

## 2024-01-24 ENCOUNTER — Other Ambulatory Visit: Payer: Self-pay | Admitting: Internal Medicine

## 2024-01-24 MED ORDER — BUDESONIDE-FORMOTEROL FUMARATE 80-4.5 MCG/ACT IN AERO: 2.0000 | INHALATION_SPRAY | Freq: Two times a day (BID) | RESPIRATORY_TRACT | 3 refills | Status: DC

## 2024-01-24 NOTE — Addendum Note (Signed)
 Addended by: Laylaa Guevarra J on: 01/24/2024 11:56 AM   Modules accepted: Orders

## 2024-02-01 ENCOUNTER — Encounter: Payer: Self-pay | Admitting: *Deleted

## 2024-02-01 ENCOUNTER — Emergency Department

## 2024-02-01 ENCOUNTER — Ambulatory Visit: Payer: Self-pay | Admitting: Pulmonary Disease

## 2024-02-01 ENCOUNTER — Other Ambulatory Visit: Payer: Self-pay

## 2024-02-01 DIAGNOSIS — J9811 Atelectasis: Secondary | ICD-10-CM | POA: Diagnosis not present

## 2024-02-01 DIAGNOSIS — R9389 Abnormal findings on diagnostic imaging of other specified body structures: Secondary | ICD-10-CM | POA: Diagnosis not present

## 2024-02-01 DIAGNOSIS — R0602 Shortness of breath: Secondary | ICD-10-CM | POA: Diagnosis present

## 2024-02-01 DIAGNOSIS — J4541 Moderate persistent asthma with (acute) exacerbation: Secondary | ICD-10-CM | POA: Insufficient documentation

## 2024-02-01 LAB — CBC
HCT: 42.9 % (ref 36.0–46.0)
Hemoglobin: 14.4 g/dL (ref 12.0–15.0)
MCH: 28.1 pg (ref 26.0–34.0)
MCHC: 33.6 g/dL (ref 30.0–36.0)
MCV: 83.8 fL (ref 80.0–100.0)
Platelets: 268 K/uL (ref 150–400)
RBC: 5.12 MIL/uL — ABNORMAL HIGH (ref 3.87–5.11)
RDW: 13.3 % (ref 11.5–15.5)
WBC: 8.1 K/uL (ref 4.0–10.5)
nRBC: 0 % (ref 0.0–0.2)

## 2024-02-01 LAB — BASIC METABOLIC PANEL WITH GFR
Anion gap: 12 (ref 5–15)
BUN: 18 mg/dL (ref 6–20)
CO2: 28 mmol/L (ref 22–32)
Calcium: 9.5 mg/dL (ref 8.9–10.3)
Chloride: 100 mmol/L (ref 98–111)
Creatinine, Ser: 0.91 mg/dL (ref 0.44–1.00)
GFR, Estimated: >60 mL/min (ref >60)
Glucose, Bld: 150 mg/dL — ABNORMAL HIGH (ref 70–99)
Potassium: 3.5 mmol/L (ref 3.5–5.1)
Sodium: 140 mmol/L (ref 135–145)

## 2024-02-01 LAB — TROPONIN I (HIGH SENSITIVITY): Troponin I (High Sensitivity): 5 ng/L (ref <18)

## 2024-02-01 NOTE — Telephone Encounter (Signed)
 Patient called back to report that her symptoms have worsened since earlier. Patient advised on how often she can use her nebulizer treatment according to the orders. Patient will use another treatment in about an hour. I advised if her symptoms continue to worsen or if the breathing treatment doesn't help that she should go to urgent care or the ED. Patient verbalized understanding and agreement of this.

## 2024-02-01 NOTE — ED Triage Notes (Signed)
 Pt ambulatory to triage.  Pt reports sob for 3 days.  Pt has pain in middle of chest.  Pt has a cough.  No fever.  Pt alert  speech clear.

## 2024-02-01 NOTE — Telephone Encounter (Signed)
 FYI Only or Action Required?: FYI only for provider.  Patient is followed in Pulmonology for asthma, last seen on 12/09/2023 by [name], MD.  Called Nurse Triage reporting Shortness of Breath.  Symptoms began several days ago.  Interventions attempted: Rescue inhaler and Nebulizer treatments.  Symptoms are: gradually worsening.  Triage Disposition: See HCP Within 4 Hours (Or PCP Triage)   Patient/caregiver understands and will follow disposition?: Yes with modifications.   Symptoms present for about a year but worsened over the last few days. Appointment scheduled for tomorrow      Copied from CRM [id number]. Topic: Clinical - Red Word Triage >> Feb 01, 2024  3:16 PM Essie A wrote: Red Word that prompted transfer to Nurse Triage: Hard time breathing, tightness in chest, coughing that's non-productive, no fever that she can tell.         Reason for Disposition  [1] MILD difficulty breathing (e.g., minimal/no SOB at rest, SOB with walking, pulse < 100) AND [2] NEW-onset or WORSE than normal  Answer Assessment - Initial Assessment Questions 1. RESPIRATORY STATUS: Describe your breathing? (e.g., wheezing, shortness of breath, unable to speak, severe coughing)      Shortness of breath  2. ONSET: When did this breathing problem begin?      Worse over the last 3 days  3. PATTERN Does the difficult breathing come and go, or has it been constant since it started?      Constant  4. SEVERITY: How bad is your breathing? (e.g., mild, moderate, severe)      Mild to moderate  5. RECURRENT SYMPTOM: Have you had difficulty breathing before? If Yes, ask: When was the last time? and What happened that time?      Yes, but this is worse  6. CARDIAC HISTORY: Do you have any history of heart disease? (e.g., heart attack, angina, bypass surgery, angioplasty)      No 7. LUNG HISTORY: Do you have any history of lung disease?  (e.g., pulmonary embolus, asthma,  emphysema)     Yes 8. CAUSE: What do you think is causing the breathing problem?      History of asthma, but worse than normal  9. OTHER SYMPTOMS: Do you have any other symptoms? (e.g., chest pain, cough, dizziness, fever, runny nose)     Non-productive cough 10. O2 SATURATION MONITOR:  Do you use an oxygen saturation monitor (pulse oximeter) at home? If Yes, ask: What is your reading (oxygen level) today? What is your usual oxygen saturation reading? (e.g., 95%)       Does not check it at home  Protocols used: Breathing Difficulty-A-AH

## 2024-02-01 NOTE — Telephone Encounter (Signed)
 Noted

## 2024-02-02 ENCOUNTER — Ambulatory Visit (INDEPENDENT_AMBULATORY_CARE_PROVIDER_SITE_OTHER): Admitting: Pulmonary Disease

## 2024-02-02 ENCOUNTER — Encounter: Payer: Self-pay | Admitting: Pulmonary Disease

## 2024-02-02 ENCOUNTER — Emergency Department
Admission: EM | Admit: 2024-02-02 | Discharge: 2024-02-02 | Disposition: A | Discharge status: Home / Self Care | Attending: Emergency Medicine | Admitting: Emergency Medicine

## 2024-02-02 VITALS — BP 108/70 | HR 80 | Temp 99.1°F | Ht 66.0 in | Wt 227.6 lb

## 2024-02-02 DIAGNOSIS — R0602 Shortness of breath: Secondary | ICD-10-CM

## 2024-02-02 DIAGNOSIS — J986 Disorders of diaphragm: Secondary | ICD-10-CM

## 2024-02-02 DIAGNOSIS — J4541 Moderate persistent asthma with (acute) exacerbation: Secondary | ICD-10-CM

## 2024-02-02 MED ORDER — AZITHROMYCIN 250 MG PO TABS: ORAL_TABLET | ORAL | 0 refills | Status: AC

## 2024-02-02 MED ORDER — CETIRIZINE HCL 10 MG PO TABS
10.0000 mg | ORAL_TABLET | Freq: Every day | ORAL | 2 refills | Status: AC
Start: 2024-02-02 — End: 2025-02-01

## 2024-02-02 MED ORDER — ONDANSETRON 4 MG PO TBDP
8.0000 mg | ORAL_TABLET | Freq: Once | ORAL | Status: AC
  Administered 2024-02-02: 8 mg via ORAL
  Filled 2024-02-02: qty 2

## 2024-02-02 MED ORDER — PREDNISONE 20 MG PO TABS
50.0000 mg | ORAL_TABLET | Freq: Once | ORAL | Status: AC
  Administered 2024-02-02: 50 mg via ORAL
  Filled 2024-02-02: qty 1

## 2024-02-02 MED ORDER — PREDNISONE 10 MG (21) PO TBPK: ORAL_TABLET | ORAL | 0 refills | Status: DC

## 2024-02-02 MED ORDER — IPRATROPIUM-ALBUTEROL 0.5-2.5 (3) MG/3ML IN SOLN
3.0000 mL | Freq: Once | RESPIRATORY_TRACT | Status: AC
  Administered 2024-02-02: 3 mL via RESPIRATORY_TRACT
  Filled 2024-02-02: qty 3

## 2024-02-02 MED ORDER — BREZTRI AEROSPHERE 160-9-4.8 MCG/ACT IN AERO: 2.0000 | INHALATION_SPRAY | Freq: Two times a day (BID) | RESPIRATORY_TRACT | 0 refills | Status: AC

## 2024-02-02 MED ORDER — AZITHROMYCIN 500 MG PO TABS
500.0000 mg | ORAL_TABLET | Freq: Once | ORAL | Status: AC
  Administered 2024-02-02: 500 mg via ORAL
  Filled 2024-02-02: qty 1

## 2024-02-02 MED ORDER — BREZTRI AEROSPHERE 160-9-4.8 MCG/ACT IN AERO
2.0000 | INHALATION_SPRAY | Freq: Two times a day (BID) | RESPIRATORY_TRACT | 6 refills | Status: AC
Start: 2024-02-02 — End: ?

## 2024-02-02 NOTE — ED Provider Notes (Signed)
 [facility] Provider Note   Event Date/Time   First MD Initiated Contact with Patient 02/02/24 0041     (approximate) History  Shortness of Breath  HPI Alexa Woods is a 63 y.o. female with a past medical history of poorly controlled asthma who presents complaining of shortness of breath has been worsening over the last 3 days with associated nonproductive cough and subjective fevers.  Patient states she called her pulmonologist yesterday and was told to come in for an appointment today but patient forgot about it.  Patient states that she has been taking her at home medications including her nebulizer treatments on time and as prescribed.  Patient denies any recent travel, sick contacts, or food out of the ordinary ROS: Patient currently denies any vision changes, tinnitus, difficulty speaking, facial droop, sore throat, chest pain, abdominal pain, nausea/vomiting/diarrhea, dysuria, or weakness/numbness/paresthesias in any extremity   Physical Exam  Triage Vital Signs: ED Triage Vitals  Encounter Vitals Group     BP 02/01/24 2024 (!) 148/96     Girls Systolic BP Percentile --      Girls Diastolic BP Percentile --      Boys Systolic BP Percentile --      Boys Diastolic BP Percentile --      Pulse Rate 02/01/24 2024 75     Resp 02/01/24 2024 20     Temp 02/01/24 2024 98.5 F (36.9 C)     Temp Source 02/01/24 2024 Oral     SpO2 02/01/24 2024 97 %     Weight 02/01/24 2021 230 lb (104.3 kg)     Height 02/01/24 2021 5' 6 (1.676 m)     Head Circumference --      Peak Flow --      Pain Score 02/01/24 2021 7     Pain Loc --      Pain Education --      Exclude from Growth Chart --    Most recent vital signs: Vitals:   02/01/24 2024 02/02/24 0045  BP: (!) 148/96 (!) 160/98  Pulse: 75 95  Resp: 20 14  Temp: 98.5 F (36.9 C)   SpO2: 97% 98%   General: Awake, oriented x4. CV:  Good peripheral perfusion. Resp:  Normal effort.  X-ray wheezing over  bilateral lung fields Abd:  No distention. Other:  Middle-aged obese Caucasian female resting comfortably in no acute distress ED Results / Procedures / Treatments  Labs (all labs ordered are listed, but only abnormal results are displayed) Labs Reviewed  BASIC METABOLIC PANEL WITH GFR - Abnormal; Notable for the following components:      Result Value   Glucose, Bld 150 (*)    All other components within normal limits  CBC - Abnormal; Notable for the following components:   RBC 5.12 (*)    All other components within normal limits  TROPONIN I (HIGH SENSITIVITY)   EKG ED ECG REPORT I, Artist MARLA Kerns, the attending physician, personally viewed and interpreted this ECG. Date: 02/02/2024 EKG Time: 2024 Rate: 80 Rhythm: normal sinus rhythm QRS Axis: normal Intervals: normal ST/T Wave abnormalities: normal Narrative Interpretation: no evidence of acute ischemia RADIOLOGY ED MD interpretation: 2 view chest x-ray shows band of atelectasis in the right lower lobe without any other evidence of acute abnormalities - All radiology independently interpreted and agree with radiology assessment Official radiology report(s): DG Chest 2 View Result Date: 02/01/2024 CLINICAL DATA:  Shortness of breath EXAM: CHEST - 2 VIEW  COMPARISON:  Chest x-ray 01/13/2024 FINDINGS: There stable mild elevation of the right hemidiaphragm. There is a band of atelectasis in the right lower lobe. The lungs are otherwise clear. There is no pleural effusion or pneumothorax. No acute fractures are seen. IMPRESSION: Band of atelectasis in the right lower lobe. Electronically Signed   By: Greig Pique M.D.   On: 02/01/2024 21:34   PROCEDURES: Critical Care performed: No Procedures MEDICATIONS ORDERED IN ED: Medications  ipratropium-albuterol  (DUONEB) 0.5-2.5 (3) MG/3ML nebulizer solution 3 mL (has no administration in time range)  predniSONE  (DELTASONE ) tablet 50 mg (50 mg Oral Given 02/02/24 0101)  azithromycin   (ZITHROMAX ) tablet 500 mg (500 mg Oral Given 02/02/24 0101)   IMPRESSION / MDM / ASSESSMENT AND PLAN / ED COURSE  I reviewed the triage vital signs and the nursing notes.                             The patient is on the cardiac monitor to evaluate for evidence of arrhythmia and/or significant heart rate changes. Patient's presentation is most consistent with acute presentation with potential threat to life or bodily function. Patient is a 63 year old female with the above-stated past medical history presents complaining of worsening shortness of breath and nonproductive cough over the last 3 days despite using her regular at home asthma treatments with no improvement. DDx: Asthma exacerbation, CHF, pulmonary edema, ACS, arrhythmia, pleural effusion, pneumonia Plan: CBC BMP-glucose 150 Troponin negative x 1 Prednisone , azithromycin , DuoNeb treatment  Rx: Prednisone , azithromycin   Dispo: Discharge home with pulmonology follow-up   FINAL CLINICAL IMPRESSION(S) / ED DIAGNOSES   Final diagnoses:  Moderate persistent asthma with exacerbation  SOB (shortness of breath)   Rx / DC Orders   ED Discharge Orders          Ordered    predniSONE  (STERAPRED UNI-PAK 21 TAB) 10 MG (21) TBPK tablet        02/02/24 0055    azithromycin  (ZITHROMAX  Z-PAK) 250 MG tablet        02/02/24 0055    Ambulatory referral to Pulmonology        02/02/24 0056    cetirizine  (ZYRTEC  ALLERGY) 10 MG tablet  Daily        02/02/24 0058           Note:  This document was prepared using Dragon voice recognition software and may include unintentional dictation errors.   Veida Spira K, MD 02/02/24 [phone]

## 2024-02-02 NOTE — Progress Notes (Unsigned)
 Synopsis: Referred in by Alexa Woods LABOR, MD   Subjective:   PATIENT ID: Alexa Woods: female DOB: 12/04/1960, MRN: [id number]  Chief Complaint  Patient presents with   Shortness of Breath    Increased SOB and cough, chest tightness, x-ray: 01/13/24, sniff test: 12/16/23  Alexa Woods to ER yesterday, and was feeling like something was stuck in her chest for a couple days prior to going to the er     HPI Alexa Woods is a pleasant 63 year old female patient with a past medical history of OSA not on CPAP, hypothyroidism, insulin -dependent diabetes mellitus type 2, GERD, moderate persistent asthma presenting today to the pulmonary clinic for ongoing shortness of breath on exertion for the past year.  She reports that since October of last year she has been experiencing worsening shortness of breath on exertion.  Associated with intermittent cough that is dry most of the time.  She does have history of OSA but does not wear CPAP.  She was diagnosed with asthma years ago has not been hospitalized for an asthma exacerbation in the past 3 years.  After reviewing her imaging She had normal chest x-ray in 2022 however in 2025 she had noticeable elevated right hemidiaphragm.  In 2025 she has chronic neck pain and had an MRI of the C-spine years ago that did not show bulging disc/osteophytes.   Labs EOS 231 in 2025  Allergen panel positive for multiple antigens including grass and trees ec..  IgE 30.   PFTs not done yet.   Family history: Mother with COPD was a heavy smoker as well as asthma  Social history: Quit smoking in 2003 smoked 1 pack/day for 30 years.  Lives at home with her husband.  Has 1 dog at home.  OV 10/21/2023 - Patient has been struggling with asthma flare ups for the last 4 months and required one round of prednisone  as outpatinet early may. She saw Alexa Woods as an acute visit and continued on prednisone . She does report feeling better today. She is on low dose ICS-LABA and  we discussed today need to go up to moderate dose which she is agreable with. We also discussed obtaining PFTs and Split night which she is agreable to both.   OV 12/09/2023 - Alexa Woods is here to follow up on her PFTs that show mild restriction but normal DLCO. Some component of reactive airway disease but does not meet ats criteria. She continues with symbicort . Furthermore, her sleep study is consistent with severe OSA and is agreeable to start CPAP. Finally, she will have her sniff test done soon.    ROS All systems were reviewed and are negative except for the above. Objective:   There were no vitals filed for this visit.    on RA BMI Readings from Last 3 Encounters:  02/01/24 37.12 kg/m  01/13/24 37.61 kg/m  01/04/24 37.53 kg/m   Wt Readings from Last 3 Encounters:  02/01/24 230 lb (104.3 kg)  01/13/24 233 lb (105.7 kg)  01/04/24 232 lb 8 oz (105.5 kg)    Physical Exam GEN: NAD, Healthy Appearing HEENT: Supple Neck, Reactive Pupils, EOMI  CVS: Normal S1, Normal S2, RRR, No murmurs or ES appreciated  Lungs: Diminished at the right base Abdomen: Soft, non tender, non distended, + BS  Extremities: Warm and well perfused, No edema   Labs and imaging were reviewed  Ancillary Information   CBC    Component Value Date/Time   WBC 8.1 02/01/2024 2024  RBC 5.12 (H) 02/01/2024 2024   HGB 14.4 02/01/2024 2024   HGB 13.7 09/18/2020 1553   HCT 42.9 02/01/2024 2024   HCT 39.9 09/18/2020 1553   PLT 268 02/01/2024 2024   PLT 182 09/18/2020 1553   MCV 83.8 02/01/2024 2024   MCV 82 09/18/2020 1553   MCV 82 09/03/2014 1604   MCH 28.1 02/01/2024 2024   MCHC 33.6 02/01/2024 2024   RDW 13.3 02/01/2024 2024   RDW 12.8 09/18/2020 1553   RDW 13.7 09/03/2014 1604   LYMPHSABS 1.3 01/04/2024 1630   LYMPHSABS 2.0 09/18/2020 1553   LYMPHSABS 2.0 05/26/2013 1844   MONOABS 0.5 01/04/2024 1630   MONOABS 0.4 05/26/2013 1844   EOSABS 0.1 01/04/2024 1630   EOSABS 0.1 09/18/2020 1553    EOSABS 0.0 05/26/2013 1844   BASOSABS 0.0 01/04/2024 1630   BASOSABS 0.1 09/18/2020 1553   BASOSABS 0.0 05/26/2013 1844       Latest Ref Rng & Units 12/07/2023    3:41 PM  PFT Results  FVC-Pre L 1.91   FVC-Predicted Pre % 53   FVC-Post L 2.09   FVC-Predicted Post % 58   Pre FEV1/FVC % % 80   Post FEV1/FCV % % 81   FEV1-Pre L 1.53   FEV1-Predicted Pre % 55   FEV1-Post L 1.69   DLCO uncorrected ml/min/mmHg 22.22   DLCO UNC% % 103   DLVA Predicted % 161   TLC L 3.77   TLC % Predicted % 70   RV % Predicted % 90      Assessment & Plan:  Alexa Woods is a pleasant 63 year old female patient with a past medical history of OSA not on CPAP, hypothyroidism, insulin -dependent diabetes mellitus type 2, GERD, moderate persistent asthma presenting today to the pulmonary clinic for ongoing shortness of breath on exertion for the past year.  Her shortness of breath has been worsening for the past year.  With now noticeable elevated right hemidiaphragm.  Her worsening shortness of breath could be multifactorial in the setting of progressing asthma as well as right hemidiaphragm elevation and sleep apnea.   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  She did have an MRI few years back that showed bulging disc and osteophytes at this level.  PFTs that show mild restriction but normal DLCO. Some component of reactive airway disease but does not meet ats criteria. She continues with symbicort .  Regarding her elevated right hemidiaphragm I will obtain a sniff test to evaluate for right hemidiaphragm paralysis.  Finally she does have a history of OSA with an AHI of 13 years ago but could not afford CPAP at the time.  Repeat sleep study now with Severe OSA and will be starting her on CPAP.   RTC 3 months.   I spent 40 minutes caring for this patient today, including preparing to see the patient, obtaining a medical history , reviewing a  separately obtained history, performing a medically appropriate examination and/or evaluation, counseling and educating the patient/family/caregiver, ordering medications, tests, or procedures, documenting clinical information in the electronic health record, and independently interpreting results (not separately reported/billed) and communicating results to the patient/family/caregiver  Darrin Barn, MD [facility] 02/02/2024 4:01 PM

## 2024-02-07 ENCOUNTER — Telehealth (INDEPENDENT_AMBULATORY_CARE_PROVIDER_SITE_OTHER): Payer: Self-pay | Admitting: Sleep Medicine

## 2024-02-07 NOTE — Telephone Encounter (Signed)
 Sleep study revealed severe OSA, results were forwarded to ordering provider.

## 2024-02-07 NOTE — Telephone Encounter (Signed)
 Noted. Referral for CPAP placed 12/09/23. Messaged Oneil Patch with [facility] for further insights.

## 2024-02-15 ENCOUNTER — Ambulatory Visit: Payer: Self-pay

## 2024-02-15 NOTE — Telephone Encounter (Signed)
 FYI Only or Action Required?: FYI only for provider.  Patient was last seen in primary care on 01/04/2024 by Randeen Laine LABOR, MD.  Called Nurse Triage reporting Cough, Fever, and Generalized Body Aches.  Symptoms began several days ago.  Interventions attempted: OTC medications: cough syrup, Mucinex and Aspirin .  Symptoms are: gradually worsening.  Triage Disposition: See Physician Within 24 Hours (overriding See HCP Within 4 Hours (Or PCP Triage))  Patient/caregiver understands and will follow disposition?: Yes                             Copied from CRM #[id number]. Topic: Clinical - Red Word Triage >> Feb 15, 2024  4:45 PM Tysheama G wrote: Red Word that prompted transfer to Nurse Triage: Coughing up phlegm, sneezing ,body aches, coughing. Can hardly walk Reason for Disposition  [1] Fever > 100 F (37.8 C) AND [2] diabetes mellitus or weak immune system (e.g., HIV positive, cancer chemo, splenectomy, organ transplant, chronic steroids)  Answer Assessment - Initial Assessment Questions 1. TEMPERATURE: What is the most recent temperature?  How was it measured?      101.8 last night 2. ONSET: When did the fever start?      Over the weekend 3. CHILLS: Do you have chills? If yes: How bad are they?  (e.g., none, mild, moderate, severe)     Yes 4. OTHER SYMPTOMS: Do you have any other symptoms besides the fever?  (e.g., abdomen pain, cough, diarrhea, earache, headache, sore throat, urination pain)     Cough, sneezing, body aches, headaches, mild stomach cramps, pain along top of ribcage, denies difficulty breathing worse than baseline- patient able to speak in clear and complete sentences while on phone with this RN  5. CAUSE: If there are no symptoms, ask: What do you think is causing the fever?      Unsure 6. CONTACTS: Does anyone else in the family have an infection?     Yes, states she was around her son who was sick 7. TREATMENT: What have you  done so far to treat this fever? (e.g., OTC fever medicines)     Cough syrup, Mucinex and Aspirin   8. IMMUNOCOMPROMISE: Do you have any of the following: diabetes, HIV positive, splenectomy, cancer chemotherapy, chronic steroid treatment, transplant patient, etc.?     Diabetic, asthma, thyroid  disease    Advised patient to see a provider within 24 hours. No availability in office tomorrow. Advised evaluation at alternate office or UC. Patient opted for UC and stated she would go.  Protocols used: [facility]

## 2024-02-16 NOTE — Telephone Encounter (Signed)
 Aware, will watch for correspondence

## 2024-02-18 ENCOUNTER — Ambulatory Visit: Payer: Self-pay

## 2024-02-18 NOTE — Telephone Encounter (Signed)
 Pt noted she would walk in to urgent care Aware, will watch for correspondence

## 2024-02-18 NOTE — Telephone Encounter (Signed)
 FYI Only or Action Required?: FYI only for provider.  Patient was last seen in primary care on 01/04/2024 by Randeen Laine LABOR, MD.  Called Nurse Triage reporting Dizziness, Generalized Body Aches, Sore Throat, and Otalgia.  Symptoms began a week ago.  Interventions attempted: OTC medications: aspirin .  Symptoms are: unchanged.  Triage Disposition: See Physician Within 24 Hours  Patient/caregiver understands and will follow disposition?: Unsure       Copied from CRM #[id number]. Topic: Clinical - Red Word Triage >> Feb 18, 2024 10:06 AM Dedra B wrote: [facility] that prompted transfer to Nurse Triage: Pt calling in with lightheadedness, sore throat, ear pain, aching all over. Had a fever of 101.8 but it went down after taking aspirin . Warm transfer to nurse triage. Reason for Disposition  [1] Pus on tonsils (back of throat) AND [2] fever AND [3] swollen neck lymph nodes (glands)  Answer Assessment - Initial Assessment Questions 1. ONSET: When did the throat start hurting? (Hours or days ago)      X 1 week 2. SEVERITY: How bad is the sore throat? (Scale 1-10; mild, moderate or severe)     3-5/10 3. STREP EXPOSURE: Has there been any exposure to strep within the past week? If Yes, ask: What type of contact occurred?      *No Answer* 4.  VIRAL SYMPTOMS: Are there any symptoms of a cold, such as a runny nose, cough, hoarse voice or red eyes?      Fever, sore throat, aches, productive cough 5. FEVER: Do you have a fever? If Yes, ask: What is your temperature, how was it measured, and when did it start?     Tmax 101.8 Has not checked today 6. PUS ON THE TONSILS: Is there pus on the tonsils in the back of your throat?     Endorses white patches 7. OTHER SYMPTOMS: Do you have any other symptoms? (e.g., difficulty breathing, headache, rash)     Endorses difficulty breathing since last October - dx with lung collapse 8. PREGNANCY: Is there any chance you are pregnant?  When was your last menstrual period?     N/a    [facility] offered Virtual UC and in-person UC appts, pt declined. Pt states that she will go as walk-in.  Protocols used: Sore Throat-A-AH

## 2024-02-21 DIAGNOSIS — M5430 Sciatica, unspecified side: Secondary | ICD-10-CM | POA: Diagnosis not present

## 2024-02-21 DIAGNOSIS — K5903 Drug induced constipation: Secondary | ICD-10-CM | POA: Diagnosis not present

## 2024-02-21 DIAGNOSIS — M542 Cervicalgia: Secondary | ICD-10-CM | POA: Diagnosis not present

## 2024-02-23 NOTE — Progress Notes (Signed)
 Alexa Woods                                          MRN: [id number]   02/23/2024   The [facility] Quality Team Specialist reviewed this patient medical record for the purposes of chart review for care gap closure. The following were reviewed: abstraction for care gap closure-kidney health evaluation for diabetes:eGFR  and uACR.    [facility] Quality Team

## 2024-03-09 ENCOUNTER — Ambulatory Visit: Payer: Self-pay

## 2024-03-09 NOTE — Telephone Encounter (Signed)
 FYI Only or Action Required?: FYI only for provider.  Patient was last seen in primary care on 01/04/2024 by Randeen Laine LABOR, MD.  Called Nurse Triage reporting Oral Pain.  Symptoms began ongoing and worsening x 2 weeks.  Interventions attempted: Prescription medications:  SABRA  Symptoms are: gradually worsening.  Triage Disposition: See Physician Within 24 Hours  Patient/caregiver understands and will follow disposition?: Yes     Copied from CRM [id number]. Topic: Clinical - Red Word Triage >> Mar 09, 2024  3:50 PM Donna BRAVO wrote: Red Word that prompted transfer to Nurse Triage: patient has painful bottom gums, bottom right side has a hole, very pain, hurts to touch cheek, whole jaw hurts, Reason for Disposition  Large lymph node (> 1 inch or 2.5 cm) under the jaw    Painful mouth, right jaw &  cheek, redness & hole in gum  Answer Assessment - Initial Assessment Questions 1. ONSET: When did your mouth start hurting? (e.g., hours or days ago)      Ongoing mouth pain and worsening x 2 weeks 2. SEVERITY: How bad is the pain? (Scale 1-10; or mild, moderate or severe)     6/10 to 10/10 3. SORES: Are there any sores or ulcers in the mouth? If Yes, ask: What part of the mouth are the sores in?     Hole in mouth 4. FEVER: Do you have a fever? If Yes, ask: What is your temperature, how was it measured, and when did it start?     no 5. CAUSE: What do you think is causing the mouth pain?     Gum disease 6. OTHER SYMPTOMS: Do you have any other symptoms? (e.g., difficulty breathing)     SOB at time - history of asthma and uses nebulizer to relief these nebulizer, reddness  Rinse mouth with warm salt water, Orajel and pain medication.  Hole in bottom right gum - pt has gum disease.  Pain goes into cheek area and jaw.  Protocols used: Mouth Pain-A-AH

## 2024-03-09 NOTE — Telephone Encounter (Signed)
 Appointment with Dr. Avelina 03/10/2024

## 2024-03-10 ENCOUNTER — Ambulatory Visit (INDEPENDENT_AMBULATORY_CARE_PROVIDER_SITE_OTHER): Admitting: Family Medicine

## 2024-03-10 VITALS — BP 106/72 | HR 75 | Temp 97.3°F | Ht 66.0 in | Wt 225.6 lb

## 2024-03-10 DIAGNOSIS — K0889 Other specified disorders of teeth and supporting structures: Secondary | ICD-10-CM | POA: Diagnosis not present

## 2024-03-10 MED ORDER — CHLORHEXIDINE GLUCONATE 0.12 % MT SOLN: 15.0000 mL | Freq: Two times a day (BID) | OROMUCOSAL | 0 refills | Status: AC

## 2024-03-10 MED ORDER — AMOXICILLIN-POT CLAVULANATE 875-125 MG PO TABS: 1.0000 | ORAL_TABLET | Freq: Two times a day (BID) | ORAL | 0 refills | Status: DC

## 2024-03-10 NOTE — Assessment & Plan Note (Signed)
 Acute, concern for dental infection, no clear abscess noted.  Significant gingivitis and gum disease.  Dental caries. Will treat with Augmentin  twice daily x 10 days.  Start Peridex  oral swish.  Recommended appointment with dentist for further assessment and recommendations in the next week.  Return and ER precautions provided.

## 2024-03-10 NOTE — Progress Notes (Signed)
 Patient ID: Alexa Woods, female    DOB: [birth year], 63 y.o.   MRN: [id number]  This visit was conducted in person.  BP 106/72   Pulse 75   Temp (!) 97.3 F (36.3 C) (Temporal)   Ht 5' 6 (1.676 m)   Wt 225 lb 9.6 oz (102.3 kg)   SpO2 94%   BMI 36.41 kg/m    CC:  Chief Complaint  Patient presents with   Oral Pain    Pt complains of hole in gum ongoing for a few weeks. Pt has history of gum disease.     Subjective:   HPI: Alexa DUMAIS is a 63 y.o. female patient of Dr. Randeen with history of type 2 diabetes, hypertension, asthma, periodontal disease presenting on 03/10/2024 for Oral Pain (Pt complains of hole in gum ongoing for a few weeks. Pt has history of gum disease. )    She has history of periodontal disease, gum issues. Now in last week she is having pain in right  lower jaw.  Gums look more red irritated.  Has opening in lower jaw. Has upper dentures.   Last saw dentist awhile ago.  Seen by  [facility] in past.    Using oragel, rinsing with salt water.  On oxycodone .     Relevant past medical, surgical, family and social history reviewed and updated as indicated. Interim medical history since our last visit reviewed. Allergies and medications reviewed and updated. Outpatient Medications Prior to Visit  Medication Sig Dispense Refill   albuterol  (VENTOLIN  HFA) 108 (90 Base) MCG/ACT inhaler INHALE 2 PUFFS INTO THE LUNGS EVERY 4 HOURS AS NEEDED FOR WHEEZING OR SHORTNESS OF BREATH. 18 each 2   B-D UF III MINI PEN NEEDLES 31G X 5 MM MISC USE AS INSTRUCTED FOR 4X DAILY INJECTIONS 400 each 1   BD INSULIN  SYRINGE U/F 31G X 5/16 0.5 ML MISC USE THREE TIMES PER DAY WITH R INSULIN  & ONCE DAILY WITH LEVEMIR  400 each 2   budesonide -glycopyrrolate-formoterol  (BREZTRI  AEROSPHERE) 160-9-4.8 MCG/ACT AERO inhaler Inhale 2 puffs into the lungs in the morning and at bedtime. 1 each 6   budesonide -glycopyrrolate-formoterol  (BREZTRI  AEROSPHERE) 160-9-4.8 MCG/ACT  AERO inhaler Inhale 2 puffs into the lungs in the morning and at bedtime. 11.8 g 0   cetirizine  (ZYRTEC  ALLERGY) 10 MG tablet Take 1 tablet (10 mg total) by mouth daily. 30 tablet 2   cholecalciferol (VITAMIN D3) 25 MCG (1000 UNIT) tablet Take 2,000 Units by mouth daily.     docusate sodium  (COLACE) 100 MG capsule Take 100 mg by mouth every morning.     ezetimibe  (ZETIA ) 10 MG tablet TAKE 1 TABLET BY MOUTH EVERY DAY 90 tablet 1   famotidine  (PEPCID ) 20 MG tablet Take 1 tablet (20 mg total) by mouth 2 (two) times daily. 180 tablet 2   FARXIGA  5 MG TABS tablet TAKE 1 TABLET BY MOUTH DAILY BEFORE BREAKFAST. 90 tablet 3   fluticasone  (FLONASE ) 50 MCG/ACT nasal spray PLACE 2 SPRAYS INTO BOTH NOSTRILS DAILY AS NEEDED FOR ALLERGIES OR RHINITIS. 48 mL 1   glucose blood (ACCU-CHEK GUIDE TEST) test strip Use to check blood sugar 4-5 times a day. 500 each 3   hydrochlorothiazide  (HYDRODIURIL ) 25 MG tablet TAKE 1 TABLET (25 MG TOTAL) BY MOUTH DAILY. 90 tablet 1   insulin  glargine (LANTUS  SOLOSTAR) 100 UNIT/ML Solostar Pen INJECT 30-35 UNITS DAILY 45 mL 1   Insulin  Pen Needle 32G X 4 MM MISC Use 4x  a day 300 each 3   Insulin  Regular Human (NOVOLIN  R FLEXPEN RELION) 100 UNIT/ML KwikPen Inject 18-25 units under skin before each meal 45 mL 3   ipratropium-albuterol  (DUONEB) 0.5-2.5 (3) MG/3ML SOLN Take 3 mLs by nebulization every 4 (four) hours as needed. 360 mL 12   ketoconazole  (NIZORAL ) 2 % cream APPLY 1 APPLICATION TOPICALLY DAILY AS NEEDED FOR IRRITATION. 30 g 1   KLOR-CON  M10 10 MEQ tablet TAKE 1 TABLET BY MOUTH EVERY DAY 90 tablet 1   Lancets (ONETOUCH ULTRASOFT) lancets Use to test blood sugar 4 times daily as instructed. 200 each 11   levothyroxine  (SYNTHROID ) 75 MCG tablet Take 1 tablet (75 mcg total) by mouth daily. 90 tablet 3   LORazepam  (ATIVAN ) 2 MG tablet Take 2 mg by mouth 2 (two) times daily.     naloxone (NARCAN) 4 MG/0.1ML LIQD nasal spray kit      oxyCODONE -acetaminophen  (PERCOCET) 10-325 MG  tablet Take 1 tablet by mouth every 4 (four) hours as needed for pain. Hold for SBP < = 105 12 tablet 0   pantoprazole  (PROTONIX ) 40 MG tablet Take 40 mg by mouth daily. In am 30 min before food or medication or vitamins     predniSONE  (STERAPRED UNI-PAK 21 TAB) 10 MG (21) TBPK tablet As directed on packaging 1 each 0   sertraline  (ZOLOFT ) 100 MG tablet Take 200 mg by mouth every morning.     Spacer/Aero-Holding Chambers (OPTICHAMBER DIAMOND) MISC USE AS INSTRUCTED 1 each 0   tiZANidine  (ZANAFLEX ) 4 MG tablet Take 4 mg by mouth 2 (two) times daily as needed for muscle spasms.     triamcinolone  cream (KENALOG ) 0.1 % Apply 1 Application topically 2 (two) times daily.     zolpidem  (AMBIEN ) 10 MG tablet Take 10 mg by mouth at bedtime as needed for sleep.     No facility-administered medications prior to visit.     Per HPI unless specifically indicated in ROS section below Review of Systems  Constitutional:  Negative for fatigue and fever.  HENT:  Positive for mouth sores. Negative for congestion.   Eyes:  Negative for pain.  Respiratory:  Negative for cough and shortness of breath.   Cardiovascular:  Negative for chest pain, palpitations and leg swelling.  Gastrointestinal:  Negative for abdominal pain.  Genitourinary:  Negative for dysuria and vaginal bleeding.  Musculoskeletal:  Negative for back pain.  Neurological:  Negative for syncope, light-headedness and headaches.  Psychiatric/Behavioral:  Negative for dysphoric mood.    Objective:  BP 106/72   Pulse 75   Temp (!) 97.3 F (36.3 C) (Temporal)   Ht 5' 6 (1.676 m)   Wt 225 lb 9.6 oz (102.3 kg)   SpO2 94%   BMI 36.41 kg/m   Wt Readings from Last 3 Encounters:  03/10/24 225 lb 9.6 oz (102.3 kg)  02/02/24 227 lb 9.6 oz (103.2 kg)  02/01/24 230 lb (104.3 kg)      Physical Exam Constitutional:      General: She is not in acute distress.    Appearance: Normal appearance. She is well-developed. She is not ill-appearing or  toxic-appearing.  HENT:     Head: Normocephalic.     Right Ear: Hearing, tympanic membrane, ear canal and external ear normal. Tympanic membrane is not erythematous, retracted or bulging.     Left Ear: Hearing, tympanic membrane, ear canal and external ear normal. Tympanic membrane is not erythematous, retracted or bulging.     Nose: No mucosal  edema or rhinorrhea.     Right Sinus: No maxillary sinus tenderness or frontal sinus tenderness.     Left Sinus: No maxillary sinus tenderness or frontal sinus tenderness.     Mouth/Throat:     Dentition: Abnormal dentition. Has dentures. Dental tenderness, gingival swelling and dental caries present. No dental abscesses or gum lesions.     Tongue: No lesions.     Pharynx: Oropharynx is clear. Uvula midline. No pharyngeal swelling or posterior oropharyngeal erythema.     Tonsils: No tonsillar exudate or tonsillar abscesses.     Comments: Ttp over right lower jaw, no fluctuance Eyes:     General: Lids are normal. Lids are everted, no foreign bodies appreciated.     Conjunctiva/sclera: Conjunctivae normal.     Pupils: Pupils are equal, round, and reactive to light.  Neck:     Thyroid : No thyroid  mass or thyromegaly.     Vascular: No carotid bruit.     Trachea: Trachea normal.  Cardiovascular:     Rate and Rhythm: Normal rate and regular rhythm.     Pulses: Normal pulses.     Heart sounds: Normal heart sounds, S1 normal and S2 normal. No murmur heard.    No friction rub. No gallop.  Pulmonary:     Effort: Pulmonary effort is normal. No tachypnea or respiratory distress.     Breath sounds: Normal breath sounds. No decreased breath sounds, wheezing, rhonchi or rales.  Abdominal:     General: Bowel sounds are normal.     Palpations: Abdomen is soft.     Tenderness: There is no abdominal tenderness.  Musculoskeletal:     Cervical back: Normal range of motion and neck supple.  Skin:    General: Skin is warm and dry.     Findings: No rash.   Neurological:     Mental Status: She is alert.  Psychiatric:        Mood and Affect: Mood is not anxious or depressed.        Speech: Speech normal.        Behavior: Behavior normal. Behavior is cooperative.        Thought Content: Thought content normal.        Judgment: Judgment normal.       Results for orders placed or performed during the hospital encounter of 02/02/24  Basic metabolic panel   Collection Time: 02/01/24  8:24 PM  Result Value Ref Range   Sodium 140 135 - 145 mmol/L   Potassium 3.5 3.5 - 5.1 mmol/L   Chloride 100 98 - 111 mmol/L   CO2 28 22 - 32 mmol/L   Glucose, Bld 150 (H) 70 - 99 mg/dL   BUN 18 6 - 20 mg/dL   Creatinine, Ser 9.08 0.44 - 1.00 mg/dL   Calcium  9.5 8.9 - 10.3 mg/dL   GFR, Estimated >39 >39 mL/min   Anion gap 12 5 - 15  CBC   Collection Time: 02/01/24  8:24 PM  Result Value Ref Range   WBC 8.1 4.0 - 10.5 K/uL   RBC 5.12 (H) 3.87 - 5.11 MIL/uL   Hemoglobin 14.4 12.0 - 15.0 g/dL   HCT 57.0 63.9 - 53.9 %   MCV 83.8 80.0 - 100.0 fL   MCH 28.1 26.0 - 34.0 pg   MCHC 33.6 30.0 - 36.0 g/dL   RDW 86.6 88.4 - 84.4 %   Platelets 268 150 - 400 K/uL   nRBC 0.0 0.0 - 0.2 %  Troponin  I (High Sensitivity)   Collection Time: 02/01/24  8:24 PM  Result Value Ref Range   Troponin I (High Sensitivity) 5 <18 ng/L    Assessment and Plan  Pain, dental Assessment & Plan: Acute, concern for dental infection, no clear abscess noted.  Significant gingivitis and gum disease.  Dental caries. Will treat with Augmentin  twice daily x 10 days.  Start Peridex  oral swish.  Recommended appointment with dentist for further assessment and recommendations in the next week.  Return and ER precautions provided.   Other orders -     Chlorhexidine  Gluconate; Use as directed 15 mLs in the mouth or throat 2 (two) times daily.  Dispense: 120 mL; Refill: 0 -     Amoxicillin -Pot Clavulanate; Take 1 tablet by mouth 2 (two) times daily.  Dispense: 20 tablet; Refill:  0    No follow-ups on file.   Greig Ring, MD

## 2024-03-10 NOTE — Telephone Encounter (Signed)
 Aware, will watch for correspondence Thanks for seeing her

## 2024-03-19 ENCOUNTER — Other Ambulatory Visit: Payer: Self-pay | Admitting: Family Medicine

## 2024-03-23 ENCOUNTER — Other Ambulatory Visit: Payer: Self-pay

## 2024-03-23 MED ORDER — EASY COMFORT ALCOHOL PADS PADS: MEDICATED_PAD | 3 refills | Status: AC

## 2024-03-23 MED ORDER — TRUE METRIX LEVEL 1 LOW VI SOLN: 2 refills | Status: AC

## 2024-04-03 ENCOUNTER — Ambulatory Visit: Admitting: Pulmonary Disease

## 2024-04-10 ENCOUNTER — Ambulatory Visit: Payer: Self-pay

## 2024-04-10 ENCOUNTER — Other Ambulatory Visit: Payer: Self-pay | Admitting: Family Medicine

## 2024-04-10 NOTE — Telephone Encounter (Signed)
 Noted. Nothing further needed.

## 2024-04-10 NOTE — Telephone Encounter (Signed)
 FYI Only or Action Required?: FYI only for provider: appointment scheduled on 04/12/24.  Patient is followed in Pulmonology for Diaphragm Dysfunction, last seen on 02/02/2024 by [name], MD.  Called Nurse Triage reporting Shortness of Breath.  Symptoms began Over a year.  Interventions attempted: Rescue inhaler, Maintenance inhaler, and Nebulizer treatments.  Symptoms are: stable.  Triage Disposition: See PCP When Office is Open (Within 3 Days)  Patient/caregiver understands and will follow disposition?: Yes Reason for Disposition  [1] MODERATE longstanding difficulty breathing (e.g., speaks in phrases, SOB even at rest, pulse 100-120) AND [2] SAME as normal  Answer Assessment - Initial Assessment Questions Stated Dr. Victory stated something about getting an MRI on the neck, has not heard anything since. 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 Uses Breztri  and Albuterol  inhalers, nebulizer and CPAP, finds these interventions mildly helpful for a little while.   1. RESPIRATORY STATUS: Describe your breathing? (e.g., wheezing, shortness of breath, unable to speak, severe coughing)      Not too bad at the moment, minimum wheezing, feels like always has to clear throat  2. ONSET: When did this breathing problem begin?      A year now  3. PATTERN Does the difficult breathing come and go, or has it been constant since it started?      Comes and goes, some days worse than others  4. SEVERITY: How bad is your breathing? (e.g., mild, moderate, severe)      Mild now but can get worse  5. RECURRENT SYMPTOM: Have you had difficulty breathing before? If Yes, ask: When was the last time? and What happened that time?      Yes  6. CARDIAC HISTORY: Do you have any history of heart disease? (e.g., heart attack, angina, bypass surgery, angioplasty)      Denies  7. LUNG HISTORY: Do you have any history of lung disease?  (e.g.,  pulmonary embolus, asthma, emphysema)     Asthma  8. CAUSE: What do you think is causing the breathing problem?      Unsure  9. OTHER SYMPTOMS: Do you have any other symptoms? (e.g., chest pain, cough, dizziness, fever, runny nose)     Sneezing a lot, chest tightness for the last year  10. O2 SATURATION MONITOR:  Do you use an oxygen saturation monitor (pulse oximeter) at home? If Yes, ask: What is your reading (oxygen level) today? What is your usual oxygen saturation reading? (e.g., 95%)       Unsure, does not have pulse ox  Protocols used: Breathing Difficulty-A-AH  Copied from CRM [id number]. Topic: Clinical - Red Word Triage >> Apr 10, 2024  2:29 PM Lavanda D wrote: Red Word that prompted transfer to Nurse Triage: Patient has been having trouble breathing for about a year now that is constant, SOB today - has had this for awhile and coughing/a cold coming on. On a nebulizer + CPAP + albuterol  and breztri . She said it is scary when she can't breathe, loses voice from time to time. Patient unsure of what to do, also had been told she would have an MRI which she has not heard back on yet. Also was told her insurance would cover her CPAP machine and is now being told she has to pay OOP which she cannot afford.

## 2024-04-11 DIAGNOSIS — F250 Schizoaffective disorder, bipolar type: Secondary | ICD-10-CM | POA: Diagnosis not present

## 2024-04-12 ENCOUNTER — Ambulatory Visit (INDEPENDENT_AMBULATORY_CARE_PROVIDER_SITE_OTHER): Admitting: Pulmonary Disease

## 2024-04-12 ENCOUNTER — Encounter: Payer: Self-pay | Admitting: Pulmonary Disease

## 2024-04-12 VITALS — BP 136/86 | HR 66 | Temp 98.7°F | Ht 66.0 in | Wt 226.4 lb

## 2024-04-12 DIAGNOSIS — J986 Disorders of diaphragm: Secondary | ICD-10-CM | POA: Diagnosis not present

## 2024-04-12 DIAGNOSIS — R0602 Shortness of breath: Secondary | ICD-10-CM | POA: Diagnosis not present

## 2024-04-12 NOTE — Progress Notes (Signed)
 Synopsis: Referred in by Randeen Laine LABOR, MD   Subjective:   PATIENT ID: Alexa Woods: female DOB: [birth year], MRN: [id number]  Chief Complaint  Patient presents with   Acute Visit    Symptoms have been going on for a long time. Cough with clear sputum. DOE. Wheezing.  Breztri - BID helps with her breathing. Duoneb- TID. Albuterol - 2-3 times daily    HPI Alexa Woods is a pleasant 63 year old female patient with a past medical history of OSA not on CPAP, hypothyroidism, insulin -dependent diabetes mellitus type 2, GERD, moderate persistent asthma presenting today to the pulmonary clinic for ongoing shortness of breath on exertion for the past year.  She reports that since October of last year she has been experiencing worsening shortness of breath on exertion.  Associated with intermittent cough that is dry most of the time.  She does have history of OSA but does not wear CPAP.  She was diagnosed with asthma years ago has not been hospitalized for an asthma exacerbation in the past 3 years.  After reviewing her imaging She had normal chest x-ray in 2022 however in 2025 she had noticeable elevated right hemidiaphragm.  In 2025 she has chronic neck pain and had an MRI of the C-spine years ago that did not show bulging disc/osteophytes.   Labs EOS 231 in 2025  Allergen panel positive for multiple antigens including grass and trees ec..  IgE 30.   PFTs not done yet.   Family history: Mother with COPD was a heavy smoker as well as asthma  Social history: Quit smoking in 2003 smoked 1 pack/day for 30 years.  Lives at home with her husband.  Has 1 dog at home.  OV 10/21/2023 - Patient has been struggling with asthma flare ups for the last 4 months and required one round of prednisone  as outpatinet early may. She saw Dr. Isaiah as an acute visit and continued on prednisone . She does report feeling better today. She is on low dose ICS-LABA and we discussed today need to go up to  moderate dose which she is agreable with. We also discussed obtaining PFTs and Split night which she is agreable to both.   OV 12/09/2023 - Alexa Woods is here to follow up on her PFTs that show mild restriction but normal DLCO. Some component of reactive airway disease but does not meet ats criteria. She continues with symbicort . Furthermore, her sleep study is consistent with severe OSA and is agreeable to start CPAP. Finally, she will have her sniff test done soon.    OV 01/2024 - Alexa Woods is here to follow up on multiple issues including her reactive airway disease. She had an exacerbation last month and required the use of prednisone  and zypec. 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 I will obtain a cervical MRI to assess for any severe stenosis.   OV 04/12/2024 - Alexa Woods is here to follow up on her asthma and right hemidiaphragm elevation. She is maxed out on her inhaler therapy. We discussed stepping up therapy to biologics which she is agreable with. We will also arrange for her to get a snif test and an MRI of the neck for elevated hemidiaphragm.    ROS All systems were reviewed and are negative except for the above. Objective:   Vitals:   04/12/24 1600  BP: 136/86  Pulse: 66  Temp: 98.7 F (37.1 C)  SpO2: 95%  Weight: 226 lb 6.4 oz (102.7 kg)  Height: 5' 6 (1.676 m)    95% on RA BMI Readings from Last 3 Encounters:  04/12/24 36.54 kg/m  03/10/24 36.41 kg/m  02/02/24 36.74 kg/m   Wt Readings from Last 3 Encounters:  04/12/24 226 lb 6.4 oz (102.7 kg)  03/10/24 225 lb 9.6 oz (102.3 kg)  02/02/24 227 lb 9.6 oz (103.2 kg)    Physical Exam GEN: NAD, Healthy Appearing HEENT: Supple Neck, Reactive Pupils, EOMI  CVS: Normal S1, Normal S2, RRR, No murmurs or ES appreciated  Lungs: Diminished at the right base Abdomen: Soft, non tender,  non distended, + BS  Extremities: Warm and well perfused, No edema   Labs and imaging were reviewed  Ancillary Information   CBC    Component Value Date/Time   WBC 8.1 02/01/2024 2024   RBC 5.12 (H) 02/01/2024 2024   HGB 14.4 02/01/2024 2024   HGB 13.7 09/18/2020 1553   HCT 42.9 02/01/2024 2024   HCT 39.9 09/18/2020 1553   PLT 268 02/01/2024 2024   PLT 182 09/18/2020 1553   MCV 83.8 02/01/2024 2024   MCV 82 09/18/2020 1553   MCV 82 09/03/2014 1604   MCH 28.1 02/01/2024 2024   MCHC 33.6 02/01/2024 2024   RDW 13.3 02/01/2024 2024   RDW 12.8 09/18/2020 1553   RDW 13.7 09/03/2014 1604   LYMPHSABS 1.3 01/04/2024 1630   LYMPHSABS 2.0 09/18/2020 1553   LYMPHSABS 2.0 05/26/2013 1844   MONOABS 0.5 01/04/2024 1630   MONOABS 0.4 05/26/2013 1844   EOSABS 0.1 01/04/2024 1630   EOSABS 0.1 09/18/2020 1553   EOSABS 0.0 05/26/2013 1844   BASOSABS 0.0 01/04/2024 1630   BASOSABS 0.1 09/18/2020 1553   BASOSABS 0.0 05/26/2013 1844       Latest Ref Rng & Units 12/07/2023    3:41 PM  PFT Results  FVC-Pre L 1.91   FVC-Predicted Pre % 53   FVC-Post L 2.09   FVC-Predicted Post % 58   Pre FEV1/FVC % % 80   Post FEV1/FCV % % 81   FEV1-Pre L 1.53   FEV1-Predicted Pre % 55   FEV1-Post L 1.69   DLCO uncorrected ml/min/mmHg 22.22   DLCO UNC% % 103   DLVA Predicted % 161   TLC L 3.77   TLC % Predicted % 70   RV % Predicted % 90      Assessment & Plan:  Alexa Woods is a pleasant 63 year old female patient with a past medical history of OSA not on CPAP, hypothyroidism, insulin -dependent diabetes mellitus type 2, GERD, moderate persistent asthma presenting today to the pulmonary clinic for ongoing shortness of breath on exertion for the past year.  Her shortness of breath has been worsening for the past year.  With now noticeable elevated right hemidiaphragm.  Her worsening shortness of breath could be multifactorial in the setting of progressing asthma as well as right hemidiaphragm  elevation and sleep apnea.   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  She did have an MRI few years back that showed bulging disc and osteophytes at this level. Given sniff test findings I wll obtain an MRI of the cervical spine to assess for any progressive disease.   PFTs that show mild restriction but normal DLCO. Some component of reactive airway disease but does not meet ats criteria. Given ongoing symptoms I will step  up therapy to biologics and start Tezspire.   Finally she does have a history of OSA with an AHI of 13, years ago but could not afford CPAP at the time.  Repeat sleep study now with Severe OSA and is compliant with her CPAP.   RTC 4 months.   I personally spent a total of 40 minutes in the care of the patient today including preparing to see the patient, getting/reviewing separately obtained history, performing a medically appropriate exam/evaluation, counseling and educating, placing orders, documenting clinical information in the EHR, independently interpreting results, and communicating results.   Darrin Barn, MD [facility] 04/12/2024 4:57 PM

## 2024-04-14 ENCOUNTER — Telehealth: Payer: Self-pay

## 2024-04-14 ENCOUNTER — Ambulatory Visit

## 2024-04-14 ENCOUNTER — Ambulatory Visit
Admission: RE | Admit: 2024-04-14 | Discharge: 2024-04-14 | Disposition: A | Discharge status: Home / Self Care | Source: Ambulatory Visit | Attending: Pulmonary Disease | Admitting: Pulmonary Disease

## 2024-04-14 DIAGNOSIS — J986 Disorders of diaphragm: Secondary | ICD-10-CM | POA: Diagnosis not present

## 2024-04-14 DIAGNOSIS — M50223 Other cervical disc displacement at C6-C7 level: Secondary | ICD-10-CM | POA: Diagnosis not present

## 2024-04-14 DIAGNOSIS — M5021 Other cervical disc displacement,  high cervical region: Secondary | ICD-10-CM | POA: Diagnosis not present

## 2024-04-14 DIAGNOSIS — M50221 Other cervical disc displacement at C4-C5 level: Secondary | ICD-10-CM | POA: Diagnosis not present

## 2024-04-14 DIAGNOSIS — M4802 Spinal stenosis, cervical region: Secondary | ICD-10-CM | POA: Diagnosis not present

## 2024-04-14 MED ORDER — GADOBUTROL 1 MMOL/ML IV SOLN
10.0000 mL | Freq: Once | INTRAVENOUS | Status: AC | PRN
  Administered 2024-04-14: 10 mL via INTRAVENOUS

## 2024-04-14 NOTE — Telephone Encounter (Signed)
 Patient was seen in the office today. Dr. Corliss has ordered Tezspire for the patient.  She has signed the form and it has been faxed to the pharmacy team for completion.

## 2024-04-17 ENCOUNTER — Telehealth: Payer: Self-pay

## 2024-04-17 ENCOUNTER — Other Ambulatory Visit: Payer: Self-pay | Admitting: Pulmonary Disease

## 2024-04-17 DIAGNOSIS — J4540 Moderate persistent asthma, uncomplicated: Secondary | ICD-10-CM

## 2024-04-17 NOTE — Telephone Encounter (Signed)
 Received Tezspire new start paperwork. Submitted a Prior Authorization request to [employer] for TEZSPIRE via CoverMyMeds. Will update once we receive a response.  Key: [id number]

## 2024-04-17 NOTE — Telephone Encounter (Signed)
 Received forms. Will open benefits investigation in separate encounter.

## 2024-04-18 NOTE — Telephone Encounter (Signed)
 Received fax from [facility] requesting to know if drug is being obtained through buy and bill, or pharmacy dispensed.   Completed response and returned fax to [phone]  Will await response.

## 2024-04-19 NOTE — Telephone Encounter (Signed)
 Received a fax regarding Prior Authorization from [employer] for TEZSPIRE. Authorization has been DENIED because the drug you asked for is not listed in your preferred drug list (formulary). The preferred drug(s), you may not have tried, are: Dupixent Pen subcutaneous pen injector, Fasenra Pen subcutaneous auto-injector, Nucala subcutaneous auto-injector.  Phone# [phone]

## 2024-04-21 ENCOUNTER — Ambulatory Visit (INDEPENDENT_AMBULATORY_CARE_PROVIDER_SITE_OTHER): Admitting: Internal Medicine

## 2024-04-21 ENCOUNTER — Encounter: Payer: Self-pay | Admitting: Internal Medicine

## 2024-04-21 ENCOUNTER — Other Ambulatory Visit: Payer: Self-pay | Admitting: Family Medicine

## 2024-04-21 VITALS — BP 112/58 | HR 57 | Resp 18 | Ht 66.0 in | Wt 226.0 lb

## 2024-04-21 DIAGNOSIS — E559 Vitamin D deficiency, unspecified: Secondary | ICD-10-CM

## 2024-04-21 DIAGNOSIS — E1159 Type 2 diabetes mellitus with other circulatory complications: Secondary | ICD-10-CM | POA: Diagnosis not present

## 2024-04-21 DIAGNOSIS — Z794 Long term (current) use of insulin: Secondary | ICD-10-CM

## 2024-04-21 DIAGNOSIS — E785 Hyperlipidemia, unspecified: Secondary | ICD-10-CM

## 2024-04-21 LAB — POCT GLYCOSYLATED HEMOGLOBIN (HGB A1C): Hemoglobin A1C: 7.2 % — AB (ref 4.0–5.6)

## 2024-04-21 NOTE — Telephone Encounter (Signed)
 Faxed [facility]ENT Tezspire appeal to [employer].   Fax # [phone]  Will await response.

## 2024-04-21 NOTE — Patient Instructions (Addendum)
 Please continue: - Farxiga  10 mg before b'fast - Lantus  30 units at bedtime - R insulin : 15 min before meals   20 units before b'fast  10-18 units before lunch 10-18 units before dinner If you plan to be more active after a meal, please reduce the insulin  before that meal by 5 units. If you have to take the insulin  after you eat, take only half of the dose.   Please come back for a follow-up appointment in 3-4 months.
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
## 2024-04-25 ENCOUNTER — Other Ambulatory Visit (HOSPITAL_COMMUNITY): Payer: Self-pay

## 2024-04-25 NOTE — Telephone Encounter (Signed)
 Received notification from [employer] regarding appeal for TEZSPIRE. Authorization has been APPROVED from 06/02/2023 to 05/31/2025. Approval letter sent to scan center.  Authorization # [id number] Phone # [phone]

## 2024-04-25 NOTE — Telephone Encounter (Signed)
 Per test claim [employer] is secondary and pt has commercial plan through [facility] that is primary. Submitted a Prior Authorization request to [facility] for TEZSPIRE via CoverMyMeds. Will update once we receive a response.  Key: [id number]

## 2024-04-26 ENCOUNTER — Ambulatory Visit

## 2024-04-26 ENCOUNTER — Other Ambulatory Visit (HOSPITAL_COMMUNITY): Payer: Self-pay

## 2024-04-26 ENCOUNTER — Ambulatory Visit: Payer: Self-pay | Admitting: Pulmonary Disease

## 2024-04-26 NOTE — Telephone Encounter (Signed)
 Received notification from [facility] regarding a prior authorization for TEZSPIRE. Authorization has been APPROVED from 04/26/24 to 10/24/24. Approval letter sent to scan center.  Unable to run test claim because pt must fill through  [facility]: [phone]  Authorization # [id number] Phone # [phone]  Copay should be reasonable if not $0 with both insurances. If not will have to enroll pt in copay card and bypass [facility] Medicare

## 2024-04-26 NOTE — Telephone Encounter (Signed)
 Called patient to discuss Tezspire new start. We reviewed purpose, proper use. She was unsure of her schedule and not ready to schedule a new start visit. I gave my direct line at [phone] she can call when she is ready to schedule.

## 2024-04-26 NOTE — Telephone Encounter (Signed)
 Noted. Nothing further needed.

## 2024-04-26 NOTE — Telephone Encounter (Signed)
 FYI Only or Action Required?: FYI only for provider: ED advised.  Patient is followed in Pulmonology for OSA, last seen on 04/12/2024 by [name], MD.  Called Nurse Triage reporting Shortness of Breath.  Symptoms began several days ago.  Interventions attempted: Rescue inhaler, Maintenance inhaler, and Nebulizer treatments.  Symptoms are: gradually worsening.  Triage Disposition: Go to ED Now (Notify PCP)  Patient/caregiver understands and will follow disposition?: Yes  Copied from CRM #[id number]. Topic: Clinical - Red Word Triage >> Apr 26, 2024  3:56 PM Alexa Woods wrote: Red Word that prompted transfer to Nurse Triage: Breathing becoming worse Reason for Disposition  [1] MODERATE difficulty breathing (e.g., speaks in phrases, SOB even at rest, pulse 100-120) AND [2] NEW-onset or WORSE than normal  Answer Assessment - Initial Assessment Questions Pt called in with worsening SOB and wheezing. She reports she has two inhalers that she is using as prescribed and nebulizers. Pt sounds weak when answering questions, when asked, pt denied difficulty speaking to RN. Based on symptoms and audible symptoms heard by RN, recommended ED disposition. Pt agreed but declined EMS. She prefers to wait for her husband to come home and take her. Discussed if he does not come home soon, breathing worsens or she feels chest pain to call back for triage to call EMS or call 911 herself. Pt voiced understanding.    1. RESPIRATORY STATUS: Describe your breathing? (e.g., wheezing, shortness of breath, unable to speak, severe coughing)      SOB, wheezing  2. ONSET: When did this breathing problem begin?      A couple days  3. PATTERN Does the difficult breathing come and go, or has it been constant since it started?      Come and go  4. SEVERITY: How bad is your breathing? (e.g., mild, moderate, severe)      Pt states her baseline is worsening; moderate  5. RECURRENT SYMPTOM: Have you  had difficulty breathing before? If Yes, ask: When was the last time? and What happened that time?      Ongoing   6. CARDIAC HISTORY: Do you have any history of heart disease? (e.g., heart attack, angina, bypass surgery, angioplasty)      Hx of a MI several years ago  7. LUNG HISTORY: Do you have any history of lung disease?  (e.g., pulmonary embolus, asthma, emphysema)     Asthma   8. CAUSE: What do you think is causing the breathing problem?      Unknown   9. OTHER SYMPTOMS: Do you have any other symptoms? (e.g., chest pain, cough, dizziness, fever, runny nose)     Cough; sneezing. Pt reports her husband had a cold and now she feels that her breathing is worse   10. O2 SATURATION MONITOR:  Do you use an oxygen saturation monitor (pulse oximeter) at home? If Yes, ask: What is your reading (oxygen level) today? What is your usual oxygen saturation reading? (e.g., 95%)       No; uses CPAP at night  Protocols used: Breathing Difficulty-A-AH

## 2024-04-30 ENCOUNTER — Other Ambulatory Visit: Payer: Self-pay | Admitting: Family Medicine

## 2024-04-30 DIAGNOSIS — G4733 Obstructive sleep apnea (adult) (pediatric): Secondary | ICD-10-CM | POA: Diagnosis not present

## 2024-05-01 ENCOUNTER — Telehealth: Payer: Self-pay | Admitting: Pulmonary Disease

## 2024-05-01 NOTE — Telephone Encounter (Signed)
 I spoke with the patient and gave her [employer]'s number. She will reach out to them today to get a new mask to see if that helps. If not she will call us  back to see if we need to adjust her pressure. She will also reach out to the pharmacy team to get set up on the Tezspire.  Nothing further needed.

## 2024-05-01 NOTE — Telephone Encounter (Signed)
 Copied from CRM #[id number]. Topic: Clinical - Medication Prior Auth >> Apr 26, 2024  3:59 PM Alexa Woods wrote: Reason for CRM: Patient states her insurance company denied coverage for her CPAP machine, she is requesting [name] to please submit a prior authorization or  a appeal and to please call her back as she is seeking help resolving this matter.  Pt is asking for assistance with insurance for her cpap machine. She needs to reach out to [employer] @ [phone]([employer]) to see what is hindering compliance.  Alexa Woods is researching further. Alexa Woods vm full her emergency contact (DPR) no voicemail set up.

## 2024-05-02 ENCOUNTER — Ambulatory Visit: Admitting: Pulmonary Disease

## 2024-05-02 NOTE — Telephone Encounter (Signed)
 Copied from CRM [id number]. Topic: General - Other >> May 02, 2024  4:46 PM Rilla B wrote: Reason for CRM: Patient calling in stating she is ready to start her injections. Please call patient to get those scheduled.   Patient also states she needs a new mask for her CPAP. States she cannot use her CPAP machine and need someone to guide her through getting it to work.  Please call patient at [phone].

## 2024-05-04 NOTE — Telephone Encounter (Signed)
 Patient called clinic to schedule Tezspire injection. See phone note 05/01/24.   Attempted to return call to patient regarding starting Tezspire - unable to LVM as mailbox is full.

## 2024-05-04 NOTE — Telephone Encounter (Signed)
 Attempted to return call to patient regarding starting Tezspire - unable to LVM as mailbox is full.

## 2024-05-06 ENCOUNTER — Other Ambulatory Visit: Payer: Self-pay | Admitting: Internal Medicine

## 2024-05-08 ENCOUNTER — Ambulatory Visit: Payer: Self-pay | Admitting: Pulmonary Disease

## 2024-05-08 DIAGNOSIS — J4540 Moderate persistent asthma, uncomplicated: Secondary | ICD-10-CM

## 2024-05-08 DIAGNOSIS — Z7409 Other reduced mobility: Secondary | ICD-10-CM

## 2024-05-08 DIAGNOSIS — Z599 Problem related to housing and economic circumstances, unspecified: Secondary | ICD-10-CM

## 2024-05-08 NOTE — Telephone Encounter (Signed)
 FYI Only or Action Required?: FYI only for provider: appointment scheduled on 12/10. (With [name], Lin[place])  Also routing note to PCP.     *PULM ACTION Items:  -Review MRI results -Discuss CPAP alternatives covered by insurance, not tolerating current device which affects compliance -Patient was not able to start new injection medication, drug store does not have (Tezspire possibly?)    *PCP ACTION Items:  -Would like assistance getting grab bar for shower (PCP) -Would like assistance with local community resources for home mold remediation, mold present in home (PCP), financial limitations    Patient is followed in Pulmonology for asthma, last seen on 04/12/2024 by [name], MD.  Called Nurse Triage reporting Breathing Problem.  Symptoms began ongoing, chronic.  Interventions attempted: Prescription medications: Breztri , Albuterol  Inhaler.  Symptoms are: stable.  Triage Disposition: See PCP When Office is Open (Within 3 Days)  Patient/caregiver understands and will follow disposition?: Yes    E2C2 Pulmonary Triage - Initial Assessment Questions "Chief Complaint (e.g., cough, sob, wheezing, fever, chills, sweat or additional symptoms) *Go to specific symptom protocol after initial questions. Moderate breathing difficulty  "How long have symptoms been present?" Baseline, fluctuating.   Have you tested for COVID or Flu? Note: If not, ask patient if a home test can be taken. If so, instruct patient to call back for positive results. Yes  -recently had flu, recovering    "Have you used your inhalers/maintenance medication?" Yes If yes, "What medications?" Breztri , Albuterol   If inhaler, ask "How many puffs and how often?" Note: Review instructions on medication in the chart. Albuterol  3 to 4x/ day; Breztri  Twice Daily as prescribed  OXYGEN: Oxygen use not reported, does not have home pulse oximeter.    Copied from CRM [id number]. Topic: Clinical - Red  Word Triage >> May 08, 2024  3:00 PM Rilla B wrote: [facility] that prompted transfer to Nurse Triage: Difficulty Breathing Reason for Disposition  [1] MODERATE longstanding difficulty breathing (e.g., speaks in phrases, SOB even at rest, pulse 100-120) AND [2] SAME as normal  Additional Information  Negative: Oxygen level (e.g., pulse oximetry) 91 to 94%    Unable to Assess  Answer Assessment - Initial Assessment Questions Patient calls reporting the following:   -Patient reports moderate difficulty breathing.  Has a history of breathing difficulty with fluctuating symptoms, good days and bad days.  Reports this difficulty is  no worse than her typical bad day today.  -Reports recently having the flu shortly after last pulmonary visit and is recovering well.  Relates some difficulty in breathing to some anxiety.  Darden recently born premature and is in NICU while his mother is also having complication of pre-eclampsia.  This has been stressful for patient, although both family members are doing well/ better.   -She reports she has not been doing well using her CPAP and wonders about getting a new/different one that insurance will cover.  Current machine/ mask makes her feel like she is suffocating and thus has some difficulty maintaining compliance with this.   -Here current difficulty breathing is Not too bad now, has recently been Moderate and fluctuates, stress makes worse.   -Wheezing not as bad today versus last night, lots of coughing, nothing ever comes up (non-productive).   -Difficulty sleeping due to stress and CPAP, has been exhausted recently after being up at hospital with daughter--in-law and baby  -[name] told patient she would need injection for lungs but patient reports they don't have at drug store (  pharmacy).  Unsure of medication name, possibly Tezspire.  -Patient inquires about recent MRI results for neck ordered by [name]  -Denies hx of  cancer -Reports hx of diabetes, asthma, diaphragm in rib, right lung collapsed  -Patient is experiencing typical difficulty speaking in full sentences (not worse) and voice has chronically been scratchy  -Denies fever/chills/sore throat (prior sore throat related to flu improving)  -Patient does not have a pulse oximeter; reports history of occasional heart palpitations with no worsening reported; no reported use of oxygen during call  -History of anxiety- taking antidepressant and anxiety medication; Has not had an anxiety attack in a long time; Reports anxiety levels currently tolerable despite recent stress; denies suicidal ideation or thoughts of hurting self; has not been getting along with husband, lives in same house with him which is stressful  -Relates asthma difficulty to cold air, snowing today; cold and hot air can be bothersome; uses 2 inhalers and nebulizer  -Breztri  BID -Albuterol  inhaler 3x /day, sometimes 4  -Medications are helpful in providing a little relief, asthma has not been as bad as in past  -Patient reports Mold in her home, has not had any professional remediations due to financial reasons, husband choice.   -Is also in need of a grab bar in the shower for her knee pain and mobility assistance.  Protocols used: Breathing Difficulty-A-AH

## 2024-05-08 NOTE — Telephone Encounter (Signed)
 I could be wrong but doubt medicare would cover for grab bar I can do a community care referral to see what they say about that and some resources also  Let me know if that is ok and I will go foreard

## 2024-05-09 DIAGNOSIS — Z7409 Other reduced mobility: Secondary | ICD-10-CM | POA: Insufficient documentation

## 2024-05-09 DIAGNOSIS — Z599 Problem related to housing and economic circumstances, unspecified: Secondary | ICD-10-CM | POA: Insufficient documentation

## 2024-05-09 NOTE — Telephone Encounter (Signed)
 I put in referral for  RN case manager (asthma/ in setting of mold)  [facility] for financial difficulties getting safety equipment for home

## 2024-05-09 NOTE — Telephone Encounter (Signed)
 Pt agreed with referral to see if the case manager can help with resources with mold and grab bar, pt will await their call

## 2024-05-10 ENCOUNTER — Encounter: Payer: Self-pay | Admitting: Pulmonary Disease

## 2024-05-10 ENCOUNTER — Ambulatory Visit: Admitting: Pulmonary Disease

## 2024-05-10 VITALS — BP 110/68 | HR 65 | Temp 98.6°F | Ht 66.0 in | Wt 225.0 lb

## 2024-05-10 DIAGNOSIS — M4802 Spinal stenosis, cervical region: Secondary | ICD-10-CM | POA: Diagnosis not present

## 2024-05-10 NOTE — Progress Notes (Signed)
 Synopsis: Referred in by Randeen Laine LABOR, MD   Subjective:   PATIENT ID: Alexa Woods: female DOB: [birth year], MRN: [id number]  Chief Complaint  Patient presents with   Follow-up    SOB. Wheezing. Cough,dry. Having problems with her CPAP mask.  Breztri - BID. Albuterol - BID. DuoNeb- BID    HPI Alexa Woods is a pleasant 63 year old female patient with a past medical history of OSA not on CPAP, hypothyroidism, insulin -dependent diabetes mellitus type 2, GERD, moderate persistent asthma presenting today to the pulmonary clinic for ongoing shortness of breath on exertion for the past year.  She reports that since October of last year she has been experiencing worsening shortness of breath on exertion.  Associated with intermittent cough that is dry most of the time.  She does have history of OSA but does not wear CPAP.  She was diagnosed with asthma years ago has not been hospitalized for an asthma exacerbation in the past 3 years.  After reviewing her imaging She had normal chest x-ray in 2022 however in 2025 she had noticeable elevated right hemidiaphragm.  In 2025 she has chronic neck pain and had an MRI of the C-spine years ago that did not show bulging disc/osteophytes.   Labs EOS 231 in 2025  Allergen panel positive for multiple antigens including grass and trees ec..  IgE 30.   PFTs not done yet.   Family history: Mother with COPD was a heavy smoker as well as asthma  Social history: Quit smoking in 2003 smoked 1 pack/day for 30 years.  Lives at home with her husband.  Has 1 dog at home.  OV 10/21/2023 - Patient has been struggling with asthma flare ups for the last 4 months and required one round of prednisone  as outpatinet early may. She saw Dr. Isaiah as an acute visit and continued on prednisone . She does report feeling better today. She is on low dose ICS-LABA and we discussed today need to go up to moderate dose which she is agreable with. We also discussed  obtaining PFTs and Split night which she is agreable to both.   OV 12/09/2023 - Alexa Woods is here to follow up on her PFTs that show mild restriction but normal DLCO. Some component of reactive airway disease but does not meet ats criteria. She continues with symbicort . Furthermore, her sleep study is consistent with severe OSA and is agreeable to start CPAP. Finally, she will have her sniff test done soon.    OV 01/2024 - Alexa Woods is here to follow up on multiple issues including her reactive airway disease. She had an exacerbation last month and required the use of prednisone  and zypec. 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 I will obtain a cervical MRI to assess for any severe stenosis.   OV 04/12/2024 - Alexa Woods is here to follow up on her asthma and right hemidiaphragm elevation. She is maxed out on her inhaler therapy. We discussed stepping up therapy to biologics which she is agreable with. We will also arrange for her to get a snif test and an MRI of the neck for elevated hemidiaphragm.    OV 05/10/2024 -Alexa Woods is here to follow-up regarding her MRI cervical spine results which shows moderate multifactorial C5 and C6 spinal stenosis with mild cord mass effect.  Also shows C6 and C7 midline caudal disc extrusion with  mild to moderate spinal stenosis and ventral cord mass.  I will have her see neurosurgery for further evaluation.  Still pending initiation of Tezspire for her severe persistent asthma.  She is also having issues with her CPAP including intolerance for which I will have her see sleep medicine.  ROS All systems were reviewed and are negative except for the above. Objective:   Vitals:   05/10/24 1529  BP: 110/68  Pulse: 65  Temp: 98.6 F (37 C)  SpO2: 95%  Weight: 225 lb (102.1 kg)  Height: 5' 6 (1.676 m)    95% on RA BMI Readings  from Last 3 Encounters:  05/10/24 36.32 kg/m  04/21/24 36.48 kg/m  04/12/24 36.54 kg/m   Wt Readings from Last 3 Encounters:  05/10/24 225 lb (102.1 kg)  04/21/24 226 lb (102.5 kg)  04/12/24 226 lb 6.4 oz (102.7 kg)    Physical Exam GEN: NAD, Healthy Appearing HEENT: Supple Neck, Reactive Pupils, EOMI  CVS: Normal S1, Normal S2, RRR, No murmurs or ES appreciated  Lungs: Diminished at the right base Abdomen: Soft, non tender, non distended, + BS  Extremities: Warm and well perfused, No edema   Labs and imaging were reviewed  Ancillary Information   CBC    Component Value Date/Time   WBC 8.1 02/01/2024 2024   RBC 5.12 (H) 02/01/2024 2024   HGB 14.4 02/01/2024 2024   HGB 13.7 09/18/2020 1553   HCT 42.9 02/01/2024 2024   HCT 39.9 09/18/2020 1553   PLT 268 02/01/2024 2024   PLT 182 09/18/2020 1553   MCV 83.8 02/01/2024 2024   MCV 82 09/18/2020 1553   MCV 82 09/03/2014 1604   MCH 28.1 02/01/2024 2024   MCHC 33.6 02/01/2024 2024   RDW 13.3 02/01/2024 2024   RDW 12.8 09/18/2020 1553   RDW 13.7 09/03/2014 1604   LYMPHSABS 1.3 01/04/2024 1630   LYMPHSABS 2.0 09/18/2020 1553   LYMPHSABS 2.0 05/26/2013 1844   MONOABS 0.5 01/04/2024 1630   MONOABS 0.4 05/26/2013 1844   EOSABS 0.1 01/04/2024 1630   EOSABS 0.1 09/18/2020 1553   EOSABS 0.0 05/26/2013 1844   BASOSABS 0.0 01/04/2024 1630   BASOSABS 0.1 09/18/2020 1553   BASOSABS 0.0 05/26/2013 1844       Latest Ref Rng & Units 12/07/2023    3:41 PM  PFT Results  FVC-Pre L 1.91   FVC-Predicted Pre % 53   FVC-Post L 2.09   FVC-Predicted Post % 58   Pre FEV1/FVC % % 80   Post FEV1/FCV % % 81   FEV1-Pre L 1.53   FEV1-Predicted Pre % 55   FEV1-Post L 1.69   DLCO uncorrected ml/min/mmHg 22.22   DLCO UNC% % 103   DLVA Predicted % 161   TLC L 3.77   TLC % Predicted % 70   RV % Predicted % 90      Assessment & Plan:  Alexa Woods is a pleasant 63 year old female patient with a past medical history of OSA not on CPAP,  hypothyroidism, insulin -dependent diabetes mellitus type 2, GERD, moderate persistent asthma presenting today to the pulmonary clinic for ongoing shortness of breath on exertion for the past year.  Her shortness of breath has been worsening for the past year.  With now noticeable elevated right hemidiaphragm.  Her worsening shortness of breath could be multifactorial in the setting of progressing asthma as well as right hemidiaphragm elevation and sleep apnea.   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  She did have an MRI few years back that showed bulging disc and osteophytes at this level. Sniff test with decreased Excursion of the right hemidiaphragm. MRI Cervical spine moderate multifactorial C5 and C6 spinal stenosis with mild cord mass effect.  Also shows C6 and C7 midline caudal disc extrusion with mild to moderate spinal stenosis and ventral cord mass. NSGY referral placed.   PFTs that show mild restriction but normal DLCO. Some component of reactive airway disease but does not meet ats criteria. Given ongoing symptoms I will step up therapy to biologics and start Tezspire.   Finally she does have a history of OSA with an AHI of 13, years ago but could not afford CPAP at the time.  Repeat sleep study now with Severe OSA and is compliant with her CPAP but having issues with mask tolerance. I will have her complete a mask fitting session and follow up with sleep medicine.   RTC 6 months.   I personally spent a total of 40 minutes in the care of the patient today including preparing to see the patient, getting/reviewing separately obtained history, performing a medically appropriate exam/evaluation, counseling and educating, placing orders, documenting clinical information in the EHR, independently interpreting results, and communicating results.   Darrin Barn, MD [facility] 05/10/2024 3:55 PM

## 2024-05-15 ENCOUNTER — Ambulatory Visit: Attending: Pulmonary Disease

## 2024-05-15 DIAGNOSIS — J45909 Unspecified asthma, uncomplicated: Secondary | ICD-10-CM
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
## 2024-05-15 NOTE — Telephone Encounter (Signed)
 Called patient to schedule new start Tezspire appointment -   She states I don't know what to do. She is hesitant to start a new medication in general because of her history of adverse effects to medications.  Discussed in detail and scheduled for appt on 05/30/24 - See pharmacotherapy visit note for details.

## 2024-05-16 ENCOUNTER — Telehealth: Payer: Self-pay

## 2024-05-16 NOTE — Progress Notes (Unsigned)
 Complex Care Management Note Care Guide Note  05/16/2024 Name: Alexa Woods MRN: [id number] DOB: Jan 16, 1961   Complex Care Management Outreach Attempts: An unsuccessful telephone outreach was attempted today to offer the patient information about available complex care management services.  Follow Up Plan:  Additional outreach attempts will be made to offer the patient complex care management information and services.   Encounter Outcome:  No Answer  Dreama [facility]  [facility], [facility] VBCI Assistant Direct Dial: [phone]  Fax: [phone]

## 2024-05-17 DIAGNOSIS — G4486 Cervicogenic headache: Secondary | ICD-10-CM | POA: Diagnosis not present

## 2024-05-19 NOTE — Progress Notes (Unsigned)
 Complex Care Management Note Care Guide Note  05/19/2024 Name: Alexa Woods MRN: [id number] DOB: 1960/07/02   Complex Care Management Outreach Attempts: A second unsuccessful outreach was attempted today to offer the patient with information about available complex care management services.  Follow Up Plan:  Additional outreach attempts will be made to offer the patient complex care management information and services.   Encounter Outcome:  No Answer  Dreama [facility]  [facility], [facility] VBCI Assistant Direct Dial: [phone]  Fax: [phone]

## 2024-05-22 ENCOUNTER — Telehealth: Payer: Self-pay

## 2024-05-22 NOTE — Telephone Encounter (Signed)
 I spoke with the patient. She said she received a letter in the mail saying the Tezspire is not approved. I told her that per the telephone encounter from 11/17 the Tezspire has been approved. I told her I would have the pharmacy team check on the approval and let her know.

## 2024-05-22 NOTE — Progress Notes (Signed)
 Complex Care Management Note Care Guide Note  05/22/2024 Name: Alexa Woods MRN: [id number] DOB: Dec 07, 1960   Complex Care Management Outreach Attempts: A third unsuccessful outreach was attempted today to offer the patient with information about available complex care management services.  Follow Up Plan:  No further outreach attempts will be made at this time. We have been unable to contact the patient to offer or enroll patient in complex care management services.  Encounter Outcome:  Patient states will call back.  Dreama [facility]  [facility], [facility] VBCI Assistant Direct Dial: [phone]  Fax: [phone]

## 2024-05-22 NOTE — Telephone Encounter (Signed)
 Copied from CRM #[id number]. Topic: Clinical - Prescription Issue >> May 22, 2024  2:22 PM Joesph PARAS wrote: Reason for CRM: Patient states that her Medicare has denied her coverage for her pharmacy appointment. Patient would like to know if we can file her spouse's [employer]. Patient would like to be advised on what to do if coverage is denied for all. Please advise patient.

## 2024-05-23 NOTE — Progress Notes (Deleted)
 "  Referring Physician:  [name], MD [address] [place],  [place] [address]  Primary Physician:  Tower, Laine LABOR, MD  History of Present Illness: 05/23/2024*** Ms. Alexa Woods has a history of HTNk CAD, asthma, GERD, DM with peripheral neuropathy, hyperlipidemia, hypothyroidism, psoriasis, history of alcohol  abuse (remission since 1998***), depression, anxiety, morbid obesity, chronic pain, gastroparesis, IBS, OSA.   Neck pain (Has seen [facility] )  Duration: *** Location: *** Quality: *** Severity: ***  Precipitating: aggravated by *** Modifying factors: made better by *** Weakness: none Timing: ***  Tobacco use: Does not smoke.   Bowel/Bladder Dysfunction: none  Conservative measures:  Physical therapy: *** has not participated in Multimodal medical therapy including regular antiinflammatories: *** Oxycodone , Tizanidine  Injections: *** no epidural steroid injections  Past Surgery: ***no spine surgeries  Alexa Woods has ***no symptoms of cervical myelopathy.  The symptoms are causing a significant impact on the patient's life.   Review of Systems:  A 10 point review of systems is negative, except for the pertinent positives and negatives detailed in the HPI.  Past Medical History: Past Medical History:  Diagnosis Date   Alcohol  abuse, in remission    since 1998   Allergic rhinitis    Anemia    Anxiety    Asthma    Bilateral lower extremity edema    Bulging lumbar disc    Bulging of cervical intervertebral disc    Chronic constipation    DDD (degenerative disc disease), lumbosacral    Depression    Dyspnea    Gastroparesis    GERD (gastroesophageal reflux disease)    Hemorrhoids    History of Bell's palsy 08/2007   left   History of chronic cystitis    IBS (irritable bowel syndrome)    Insulin  dependent type 2 diabetes mellitus [facility])    endocrinologist-- dr trixie   Lower urinary tract symptoms (LUTS)    OA  (osteoarthritis)    knees, back, hands, elbows   OSA (obstructive sleep apnea)    per study 06-08-2017 mild osa , cpap recommended , per pt insurance issue   Peripheral neuropathy    PONV (postoperative nausea and vomiting)    Psoriasis    S/P dilatation of esophageal stricture    Unspecified essential hypertension    Wears partial dentures    lower    Past Surgical History: Past Surgical History:  Procedure Laterality Date   CHOLECYSTECTOMY OPEN  1990   AND APPENDECTOMY   DOBUTAMINE  STRESS ECHO  2009    normal stress echo, no evidence of ischemia   ELBOW SURGERY Bilateral RIGHT 2013;  LEFT 2016   for nerve damage   ESOPHAGOGASTRODUODENOSCOPY  07/2002   erythematous gastropathy   eye sugery Bilateral    KNEE ARTHROSCOPY Left 11/ 2018   dr melodi  @ [facility]   KNEE ARTHROSCOPY W/ PARTIAL MEDIAL MENISCECTOMY Right 10/2008   and chondroplasty   SHOULDER ARTHROSCOPY WITH DISTAL CLAVICLE RESECTION Left 12/ 2011   dr dean   TOTAL KNEE ARTHROPLASTY Right 07-01-2009  dr melodi   [facility]   TOTAL KNEE ARTHROPLASTY Left 12/06/2017   Procedure: LEFT TOTAL LEFT KNEE ARTHROPLASTY;  Surgeon: Melodi Lerner, MD;  Location: [facility];  Service: Orthopedics;  Laterality: Left;   Trigger finger surgery Left 07/2022   VAGINAL HYSTERECTOMY  2003    Allergies: Allergies as of 05/31/2024 - Review Complete 05/10/2024  Allergen Reaction Noted   Bupropion hcl Other (See Comments)  Cefuroxime axetil Nausea Only 05/13/2011   Crestor  [rosuvastatin  calcium ]  07/02/2021   Gabapentin  Other (See Comments) 12/06/2017   Lidocaine Other (See Comments) 02/16/2007   Metformin Other (See Comments) 11/30/2006   Other Other (See Comments) 09/11/2019   Paroxetine Other (See Comments) 11/30/2006   Propoxyphene Other (See Comments) 11/30/2017   Tramadol Other (See Comments) 03/10/2010   Tramadol hcl  03/10/2010   Wellbutrin [bupropion] Other (See Comments) 09/11/2019   Codeine Nausea And Vomiting and Rash 11/30/2006    Sulfa antibiotics Rash and Other (See Comments) 11/30/2017    Medications: Outpatient Encounter Medications as of 05/31/2024  Medication Sig   albuterol  (VENTOLIN  HFA) 108 (90 Base) MCG/ACT inhaler INHALE 2 PUFFS INTO THE LUNGS EVERY 4 HOURS AS NEEDED FOR WHEEZING OR SHORTNESS OF BREATH.   Alcohol  Swabs (EASY COMFORT ALCOHOL  PADS) PADS Use 1 alcohol  pad before each fingerstick.   B-D UF III MINI PEN NEEDLES 31G X 5 MM MISC USE AS INSTRUCTED FOR 4X DAILY INJECTIONS   BD INSULIN  SYRINGE U/F 31G X 5/16 0.5 ML MISC USE THREE TIMES PER DAY WITH R INSULIN  & ONCE DAILY WITH LEVEMIR    Blood Glucose Calibration (TRUE METRIX LEVEL 1) Low SOLN Use to run controls on glucometer.   budesonide -glycopyrrolate-formoterol  (BREZTRI  AEROSPHERE) 160-9-4.8 MCG/ACT AERO inhaler Inhale 2 puffs into the lungs in the morning and at bedtime.   budesonide -glycopyrrolate-formoterol  (BREZTRI  AEROSPHERE) 160-9-4.8 MCG/ACT AERO inhaler Inhale 2 puffs into the lungs in the morning and at bedtime.   cetirizine  (ZYRTEC  ALLERGY) 10 MG tablet Take 1 tablet (10 mg total) by mouth daily.   chlorhexidine  (PERIDEX ) 0.12 % solution Use as directed 15 mLs in the mouth or throat 2 (two) times daily.   cholecalciferol (VITAMIN D3) 25 MCG (1000 UNIT) tablet Take 2,000 Units by mouth daily.   docusate sodium  (COLACE) 100 MG capsule Take 100 mg by mouth every morning.   ezetimibe  (ZETIA ) 10 MG tablet TAKE 1 TABLET BY MOUTH EVERY DAY   famotidine  (PEPCID ) 20 MG tablet TAKE 1 TABLET BY MOUTH TWICE A DAY   FARXIGA  10 MG TABS tablet Take 10 mg by mouth every morning.   fluticasone  (FLONASE ) 50 MCG/ACT nasal spray PLACE 2 SPRAYS INTO BOTH NOSTRILS DAILY AS NEEDED FOR ALLERGIES OR RHINITIS.   glucose blood (ACCU-CHEK GUIDE TEST) test strip USE TO CHECK BLOOD SUGAR 4-5 TIMES A DAY.   hydrochlorothiazide  (HYDRODIURIL ) 25 MG tablet TAKE 1 TABLET (25 MG TOTAL) BY MOUTH DAILY.   insulin  glargine (LANTUS  SOLOSTAR) 100 UNIT/ML Solostar Pen INJECT  30-35 UNITS DAILY   Insulin  Pen Needle 32G X 4 MM MISC Use 4x a day   Insulin  Regular Human (NOVOLIN  R FLEXPEN RELION) 100 UNIT/ML KwikPen Inject 18-25 units under skin before each meal   ipratropium-albuterol  (DUONEB) 0.5-2.5 (3) MG/3ML SOLN Take 3 mLs by nebulization every 4 (four) hours as needed.   ketoconazole  (NIZORAL ) 2 % cream APPLY 1 APPLICATION TOPICALLY DAILY AS NEEDED FOR IRRITATION.   Lancets (ONETOUCH ULTRASOFT) lancets Use to test blood sugar 4 times daily as instructed.   levothyroxine  (SYNTHROID ) 75 MCG tablet Take 1 tablet (75 mcg total) by mouth daily.   LORazepam  (ATIVAN ) 2 MG tablet Take 2 mg by mouth 2 (two) times daily.   naloxone (NARCAN) 4 MG/0.1ML LIQD nasal spray kit    oxyCODONE -acetaminophen  (PERCOCET) 10-325 MG tablet Take 1 tablet by mouth every 4 (four) hours as needed for pain. Hold for SBP < = 105   pantoprazole  (PROTONIX ) 40 MG tablet Take 40  mg by mouth daily. In am 30 min before food or medication or vitamins   potassium chloride  (KLOR-CON  M) 10 MEQ tablet TAKE 1 TABLET BY MOUTH EVERY DAY   sertraline  (ZOLOFT ) 100 MG tablet Take 200 mg by mouth every morning.   Spacer/Aero-Holding Chambers (OPTICHAMBER DIAMOND) MISC USE AS INSTRUCTED   tiZANidine  (ZANAFLEX ) 4 MG tablet Take 4 mg by mouth 2 (two) times daily as needed for muscle spasms.   triamcinolone  cream (KENALOG ) 0.1 % Apply 1 Application topically 2 (two) times daily.   zolpidem  (AMBIEN ) 10 MG tablet Take 10 mg by mouth at bedtime as needed for sleep.   No facility-administered encounter medications on file as of 05/31/2024.    Social History: Social History[1]  Family Medical History: Family History  Problem Relation Age of Onset   Alcohol  abuse Mother    Hypertension Mother    Diabetes Mother    Heart failure Mother    Diabetes Sister    Heart attack Sister    Diabetes Sister    Diabetes Sister    Diabetes Sister    Celiac disease Daughter    Esophageal cancer Maternal Grandmother     Diabetes Brother    Parkinson's disease Brother    Heart attack Brother    Heart attack Brother 28   Diabetes Brother    Diabetes Brother    Diabetes Brother    Thyroid  disease Niece    Lung cancer Other        mat great uncle   Colon cancer Neg Hx    Breast cancer Neg Hx     Physical Examination: There were no vitals filed for this visit.  General: Patient is well developed, well nourished, calm, collected, and in no apparent distress. Attention to examination is appropriate.  Respiratory: Patient is breathing without any difficulty.   NEUROLOGICAL:     Awake, alert, oriented to person, place, and time.  Speech is clear and fluent. Fund of knowledge is appropriate.   Cranial Nerves: Pupils equal round and reactive to light.  Facial tone is symmetric.    *** ROM of cervical spine *** pain *** posterior cervical tenderness. *** tenderness in bilateral trapezial region.   *** ROM of lumbar spine *** pain *** posterior lumbar tenderness.   No abnormal lesions on exposed skin.   Strength: Side Biceps Triceps Deltoid Interossei Grip Wrist Ext. Wrist Flex.  R 5 5 5 5 5 5 5   L 5 5 5 5 5 5 5    Side Iliopsoas Quads Hamstring PF DF EHL  R 5 5 5 5 5 5   L 5 5 5 5 5 5    Reflexes are ***2+ and symmetric at the biceps, brachioradialis, patella and achilles.   Hoffman's is absent.  Clonus is not present.   Bilateral upper and lower extremity sensation is intact to light touch.     Gait is normal.   ***No difficulty with tandem gait.    Medical Decision Making  Imaging: Cervical MRI dated 04/14/24:   FINDINGS:   BONES AND ALIGNMENT: Improved cervical lordosis from the prior CT. No significant scoliosis or spondylolisthesis. Normal vertebral body heights. Marrow signal is unremarkable. No convincing marrow edema or acute osseous abnormality. No abnormal enhancement.   SPINAL CORD: Normal spinal cord size. No spinal cord signal abnormality identified  despite degenerative spinal cord mass effect. Normal cervicomedullary junction. Negative visible posterior fossa.   SOFT TISSUES: Preserved major vascular phleboliths in the bilateral neck. Left vertebral artery chronically dominant.  Negative visible neck soft tissues and lung apices. No paraspinal mass. No abnormal intradural enhancement. No dural thickening.   DEGENERATIVE: C2-C3: Mild foraminal disc bulging and endplate spurring. Moderate ligamentum flavum hypertrophy in the midline. No stenosis. C3-C4: Mostly foraminal disc bulging and endplate spurring. Moderate facet and ligamentum flavum hypertrophy. Mild spinal stenosis. No cord mass effect. Moderate left and mild right C4 neural foraminal stenosis. C4-C5: Circumferential disc bulge and endplate spurring. Mild facet and ligamentum flavum hypertrophy. Mild bilateral C5 neural foraminal stenosis. C5-C6: Chronic disc space loss, circumferential disc osteophyte complex. Broad based posterior component (series 8, image 18). Moderate facet and ligamentum flavum hypertrophy. Moderate spinal stenosis and mild spinal cord mass effect. Mild to moderate left and moderate to severe right C6 neural foraminal stenosis. C6-C7: Caudal disc extrusion in the midline (series 8, images 23 through 25). The extruded disc fragment is about 4 mm in diameter, approximately 6 to 7 mm in length. Superimposed mild disc bulging and endplate spurring with moderate facet and ligamentum flavum hypertrophy. Mild to moderate spinal stenosis and mild ventral cord mass effect. Mild C7 neural foraminal stenosis. C7-T1: Foraminal disc bulging and endplate spurring. Moderate facet and ligamentum flavum hypertrophy. No spinal stenosis. Moderate bilateral C8 neural foraminal stenosis. No significant stenosis in the visible upper thoracic spine.   IMPRESSION: 1. Multilevel cervical degeneration, progressed from priors. No acute osseous abnormality. 2.  Moderate multifactorial C5C6 spinal stenosis with mild cord mass effect. 3. C6C7 midline caudal disc extrusion (4 mm x 7 mm) causing mild to moderate spinal stenosis and ventral cord mass effect. 4. No spinal cord signal abnormality identified. 5. Moderate and occasionally Severe (right C6 nerve level) degenerative neural foraminal stenosis.   Electronically signed by: Helayne Hurst MD 04/19/2024 07:24 AM EST RP Workstation: [id number]      I have personally reviewed the images and agree with the above interpretation.  Assessment and Plan: Ms. Alexa Woods is a pleasant 63 y.o. female has ***  Treatment options discussed with patient and following plan made:   - Order for physical therapy for *** spine ***. Patient to call to schedule appointment. *** - Continue current medications including ***. Reviewed dosing and side effects.  - Prescription for ***. Reviewed dosing and side effects. Take with food.  - Prescription for *** to take prn muscle spasms. Reviewed dosing and side effects. Discussed this can cause drowsiness.  - MRI of *** to further evaluate *** radiculopathy. No improvement time or medications (***).  - Referral to PM[facility] at [facility] to discuss possible *** injections.  - Will schedule phone visit to review MRI results once I get them back.   I spent a total of *** minutes in face-to-face and non-face-to-face activities related to this patient's care today including review of outside records, review of imaging, review of symptoms, physical exam, discussion of differential diagnosis, discussion of treatment options, and documentation.   Thank you for involving me in the care of this patient.   Glade Boys PA-C Dept. of Neurosurgery      [1]  Social History Tobacco Use   Smoking status: Former    Current packs/day: 0.00    Average packs/day: 1 pack/day for 25.0 years (25.0 ttl pk-yrs)    Types: Cigarettes    Start date: 12/30/1976    Quit date: 12/30/2001    Years since  quitting: 22.4   Smokeless tobacco: Never  Vaping Use   Vaping status: Never Used  Substance Use Topics   Alcohol  use: No  Alcohol /week: 0.0 standard drinks of alcohol     Comment: hx alcoholism--  stopped 1998    Drug use: No   "

## 2024-05-29 ENCOUNTER — Ambulatory Visit: Admitting: Sleep Medicine

## 2024-05-30 ENCOUNTER — Other Ambulatory Visit

## 2024-05-31 ENCOUNTER — Ambulatory Visit: Admitting: Orthopedic Surgery

## 2024-05-31 ENCOUNTER — Telehealth: Payer: Self-pay

## 2024-05-31 NOTE — Telephone Encounter (Signed)
 Received CRM that patient thought her appointment with pharmacy team was on 12/31 at 3 PM. Clinic will be closed at that time.   ATC patient at number she requested a return call, [phone]. Mailbox is full.

## 2024-06-07 ENCOUNTER — Ambulatory Visit: Admitting: Nurse Practitioner

## 2024-06-07 NOTE — Telephone Encounter (Signed)
 Scheduled for new start biologic visit 06/13/23

## 2024-06-09 NOTE — Telephone Encounter (Signed)
 Attempted to call patient to remind her of her appointment. Voice mailbox was full.

## 2024-06-12 ENCOUNTER — Other Ambulatory Visit

## 2024-06-13 ENCOUNTER — Telehealth: Payer: Self-pay

## 2024-06-13 NOTE — Telephone Encounter (Signed)
 Patient has missed appointment three times for new start Tezspire. She left me a voicemail stating she missed the appointment.   Routing to front desk for rescheduling. She is unable to receive the medication until her first injection in clinic.

## 2024-06-14 ENCOUNTER — Other Ambulatory Visit: Payer: Self-pay | Admitting: Internal Medicine

## 2024-06-14 ENCOUNTER — Other Ambulatory Visit: Payer: Self-pay | Admitting: Family Medicine

## 2024-06-14 NOTE — Telephone Encounter (Signed)
 Alexa Woods, [place] MRN: [id number]  Appointment Date: 06/19/2024 Status:  Time: 3:00 PM Length:  Visit Type: PHARMACY 60 [1587]

## 2024-06-19 ENCOUNTER — Telehealth: Payer: Self-pay

## 2024-06-19 ENCOUNTER — Ambulatory Visit

## 2024-06-19 DIAGNOSIS — J4540 Moderate persistent asthma, uncomplicated: Secondary | ICD-10-CM

## 2024-06-19 DIAGNOSIS — Z7189 Other specified counseling: Secondary | ICD-10-CM

## 2024-06-19 NOTE — Patient Instructions (Signed)
 Today we reviewed how to use Tezspire using a demo device. You were able to demonstrate how to use Tezspire using the demo device.   Some things to remember -   This medication is stored in the refrigerator. Please note, you should remove the drug from the refrigerator AT LEAST 45 minutes prior to injection to ensure the injection comes down to room temperature before injecting the medication.   If needed, this medication can be stored at room temperature for no longer than 30 days.   If you miss a dose, you should take it as soon as you remember it.   As a reminder, some side effects include injection site reaction, headache, and joint ache. If you are struggling with side effects, please call our office.   It can take several weeks to see the potential benefit of the medication. Please continue your other medications.   CONTINUE all of your other medications  Your prescription will be shipped from [facility]: [id number].    Today, you expressed that you would like to have your first dose in clinic. We did not have this medication in stock today. You prefer to schedule an appointment in the next couple weeks to start Tezspire.   Please look for a phone call from our pharmacy team when this medication is available so we can schedule your appointment.   Stay up to date on all routine vaccines: influenza, pneumonia, COVID19, Shingles  How to manage an injection site reaction: Remember the 5 C's: COUNTER - leave on the counter at least 30 minutes but up to overnight to bring medication to room temperature. This may help prevent stinging COLD - place something cold (like an ice gel pack or cold water bottle) on the injection site just before cleansing with alcohol . This may help reduce pain CLARITIN - use Claritin (generic name is loratadine) for the first two weeks of treatment or the day of, the day before, and the day after injecting. This will help to minimize injection site  reactions CORTISONE CREAM - apply if injection site is irritated and itching CALL ME - if injection site reaction is bigger than the size of your fist, looks infected, blisters, or if you develop hives

## 2024-06-19 NOTE — Telephone Encounter (Signed)
 Unfortunately the last Tezspire sample was used this morning. Unexpectedly there is not enough Tezspire samples for appointment this afternoon on 1/19 at 3PM.   I attempted to call the patient twice at [phone]. Unable to LVM due to mailbox full.   I attempted to reach spouse, Alexa Woods, at [phone], and left a HIPAA compliant VM explaining that we will need to reschedule the appointment for [facility]. I left our office phone number to return call for rescheduling.

## 2024-06-19 NOTE — Progress Notes (Signed)
 "  HPI Patient presents today to [facility] to see pharmacy team for Tezspire new start. Several scheduling barriers have delayed this appointment, including transportation barriers and spouse's work schedule (spouse is primary driver in the home).   Before this appointment today, the last Tezspire sample was used. I made two attempts to call patient ahead of this appointment - mailbox full. I tried calling her spouse who drives her to appointments. LVM. Patient has previously no-showed several times, but she made it today. Plan to review Tezspire injection using demo, and if she feels comfortable I can triage Rx to pharmacy for at-home injection.  Referred by [name]. Last seen by [name] on 05/10/24. PMH OSA not on CPAP, hypothyroidism, insulin -dependent diabetes mellitus type 2, GERD, and moderate persistent asthma.   OBJECTIVE Allergies[1]  Outpatient Encounter Medications as of 06/19/2024  Medication Sig Note   albuterol  (VENTOLIN  HFA) 108 (90 Base) MCG/ACT inhaler INHALE 2 PUFFS INTO THE LUNGS EVERY 4 HOURS AS NEEDED FOR WHEEZING OR SHORTNESS OF BREATH.    Alcohol  Swabs (EASY COMFORT ALCOHOL  PADS) PADS Use 1 alcohol  pad before each fingerstick.    B-D UF III MINI PEN NEEDLES 31G X 5 MM MISC USE AS INSTRUCTED FOR 4X DAILY INJECTIONS    BD INSULIN  SYRINGE U/F 31G X 5/16 0.5 ML MISC USE THREE TIMES PER DAY WITH R INSULIN  & ONCE DAILY WITH LEVEMIR     Blood Glucose Calibration (TRUE METRIX LEVEL 1) Low SOLN Use to run controls on glucometer.    budesonide -glycopyrrolate-formoterol  (BREZTRI  AEROSPHERE) 160-9-4.8 MCG/ACT AERO inhaler Inhale 2 puffs into the lungs in the morning and at bedtime.    budesonide -glycopyrrolate-formoterol  (BREZTRI  AEROSPHERE) 160-9-4.8 MCG/ACT AERO inhaler Inhale 2 puffs into the lungs in the morning and at bedtime.    cetirizine  (ZYRTEC  ALLERGY) 10 MG tablet Take 1 tablet (10 mg total) by mouth daily.    chlorhexidine  (PERIDEX ) 0.12 % solution Use as  directed 15 mLs in the mouth or throat 2 (two) times daily.    cholecalciferol (VITAMIN D3) 25 MCG (1000 UNIT) tablet Take 2,000 Units by mouth daily.    docusate sodium  (COLACE) 100 MG capsule Take 100 mg by mouth every morning.    ezetimibe  (ZETIA ) 10 MG tablet TAKE 1 TABLET BY MOUTH EVERY DAY    famotidine  (PEPCID ) 20 MG tablet TAKE 1 TABLET BY MOUTH TWICE A DAY    FARXIGA  10 MG TABS tablet TAKE 1 TABLET BY MOUTH DAILY BEFORE BREAKFAST.    fluticasone  (FLONASE ) 50 MCG/ACT nasal spray PLACE 2 SPRAYS INTO BOTH NOSTRILS DAILY AS NEEDED FOR ALLERGIES OR RHINITIS.    glucose blood (ACCU-CHEK GUIDE TEST) test strip USE TO CHECK BLOOD SUGAR 4-5 TIMES A DAY.    hydrochlorothiazide  (HYDRODIURIL ) 25 MG tablet TAKE 1 TABLET (25 MG TOTAL) BY MOUTH DAILY.    insulin  glargine (LANTUS  SOLOSTAR) 100 UNIT/ML Solostar Pen INJECT 30-35 UNITS DAILY    Insulin  Pen Needle 32G X 4 MM MISC Use 4x a day    Insulin  Regular Human (NOVOLIN  R FLEXPEN RELION) 100 UNIT/ML KwikPen Inject 18-25 units under skin before each meal    ipratropium-albuterol  (DUONEB) 0.5-2.5 (3) MG/3ML SOLN Take 3 mLs by nebulization every 4 (four) hours as needed.    ketoconazole  (NIZORAL ) 2 % cream APPLY 1 APPLICATION TOPICALLY DAILY AS NEEDED FOR IRRITATION.    Lancets (ONETOUCH ULTRASOFT) lancets Use to test blood sugar 4 times daily as instructed.    levothyroxine  (SYNTHROID ) 75 MCG tablet Take 1 tablet (75 mcg  total) by mouth daily.    LORazepam  (ATIVAN ) 2 MG tablet Take 2 mg by mouth 2 (two) times daily.    naloxone (NARCAN) 4 MG/0.1ML LIQD nasal spray kit  07/02/2021: On hand   oxyCODONE -acetaminophen  (PERCOCET) 10-325 MG tablet Take 1 tablet by mouth every 4 (four) hours as needed for pain. Hold for SBP < = 105    pantoprazole  (PROTONIX ) 40 MG tablet Take 40 mg by mouth daily. In am 30 min before food or medication or vitamins    potassium chloride  (KLOR-CON  M) 10 MEQ tablet TAKE 1 TABLET BY MOUTH EVERY DAY    sertraline  (ZOLOFT ) 100 MG  tablet Take 200 mg by mouth every morning.    Spacer/Aero-Holding Chambers (OPTICHAMBER DIAMOND) MISC USE AS INSTRUCTED    tiZANidine  (ZANAFLEX ) 4 MG tablet Take 4 mg by mouth 2 (two) times daily as needed for muscle spasms.    triamcinolone  cream (KENALOG ) 0.1 % Apply 1 Application topically 2 (two) times daily.    zolpidem  (AMBIEN ) 10 MG tablet Take 10 mg by mouth at bedtime as needed for sleep.    No facility-administered encounter medications on file as of 06/19/2024.     Immunization History  Administered Date(s) Administered   Influenza Inj Mdck Quad Pf 05/06/2018   Influenza Split 02/20/2011, 04/19/2012   Influenza Whole 04/02/2006, 06/19/2009   Influenza, Seasonal, Injecte, Preservative Fre 08/06/2023   Influenza,inj,Quad PF,6+ Mos 04/02/2014, 08/02/2015, 04/07/2016, 04/05/2017   Influenza,inj,quad, With Preservative 04/01/2017   Influenza-Unspecified 02/07/2019   PNEUMOCOCCAL CONJUGATE-20 08/06/2023   Pneumococcal Polysaccharide-23 06/29/2007, 08/02/2015   Td 08/07/1988, 02/18/2003   Td (Adult), 2 Lf Tetanus Toxid, Preservative Free 02/18/2003   Tdap 08/22/2018     PFTs    Latest Ref Rng & Units 12/07/2023    3:41 PM  PFT Results  FVC-Pre L 1.91   FVC-Predicted Pre % 53   FVC-Post L 2.09   FVC-Predicted Post % 58   Pre FEV1/FVC % % 80   Post FEV1/FCV % % 81   FEV1-Pre L 1.53   FEV1-Predicted Pre % 55   FEV1-Post L 1.69   DLCO uncorrected ml/min/mmHg 22.22   DLCO UNC% % 103   DLVA Predicted % 161   TLC L 3.77   TLC % Predicted % 70   RV % Predicted % 90      Assessment   Biologics training for tezepulumab Phill)  Goals of therapy: Mechanism: human monoclonal IgG2? antibody that binds to TSLP. This blocks TSLP from its effect on inflammation including reduce eosinophils, IgE, FeNO, IL-5, and IL-13. Mechanism is not definitively established. Reviewed that Tezspire is add-on medication and patient must continue maintenance inhaler regimen. Response to  therapy: may take 3-4 months to determine efficacy.  Side effects: injection site reaction (6-18%), antibody development (2%), arthralgia (4%), back pain (4%), pharyngitis (4%)  Dose: Tezspire 210 mg once every 4 weeks  Administration/Storage:  Reviewed administration sites of thigh or abdomen (at least 2-3 inches away from abdomen). Reviewed the upper arm is only appropriate if caregiver is administering injection  Do not shake pen/syringe as this could lead to product foaming or precipitation. Do not shake syringe as this could lead to product foaming or precipitation.  Access: Approval of Tezspire through: insurance Patient enrolled into copay card program to help with copay assistance.  No Tezspire samples available for injection in clinic today.  Demonstrated appropriate injection technique using demo. Patient was able to demonstrate understanding using demo device.  Medication Reconciliation  A drug  regimen assessment was performed, including review of allergies, interactions, disease-state management, dosing and immunization history. Medications were reviewed with the patient, including name, instructions, indication, goals of therapy, potential side effects, importance of adherence, and safe use.   PLAN START Tezspire 210mg  SQ every 28 days.  Rx sent to: [facility]: [phone].   Continue maintenance respiratory regimen Of note, although patient demonstrates appropriate injection technique using demo device, she requests to schedule a follow-up appointment for a time when we have Tezspire samples so that her first dose can be observed in clinic.  PharmD to contact patient when we have Tezspire samples in. We reviewed risks may include further delays in starting therapy. Patient verbalizes understanding and agreement with plan.  All questions encouraged and answered.  Instructed patient to reach out with any further questions or concerns.  Thank you for allowing  pharmacy to participate in this patient's care.  This appointment required 30 minutes of patient care (this includes precharting, chart review, review of results, face-to-face care, etc.).  Aleck Puls, PharmD, BCPS, CPP Clinical Pharmacist  [facility]  [facility]     [1]  Allergies Allergen Reactions   Bupropion Hcl Other (See Comments)    Pt is unsure   Cefuroxime Axetil Nausea Only   Crestor  [Rosuvastatin  Calcium ]     Pt states it makes her feel weird    Gabapentin  Other (See Comments)    Pt does not remember reaction  Pt does not remember reaction    Lidocaine Other (See Comments)    REACTION: unknown REACTION: unknown   Metformin Other (See Comments)    REACTION: GI REACTION: GI   Other Other (See Comments)   Paroxetine Other (See Comments)    REACTION: doesn't agree REACTION: doesn't agree   Propoxyphene Other (See Comments)    wheezing wheezing   Tramadol Other (See Comments)    REACTION: Causes Anxiety   Tramadol Hcl     REACTION: Causes Anxiety   Wellbutrin [Bupropion] Other (See Comments)    Pt is unsure   Codeine Nausea And Vomiting and Rash   Sulfa Antibiotics Rash and Other (See Comments)   "

## 2024-06-19 NOTE — Telephone Encounter (Signed)
 Second attempt to reach patient at [phone]. Mailbox full. Unable to leave VM.

## 2024-06-20 ENCOUNTER — Ambulatory Visit: Payer: Self-pay | Admitting: *Deleted

## 2024-06-20 NOTE — Telephone Encounter (Signed)
 Will see patient then Agree with ER and UC precautions

## 2024-06-20 NOTE — Telephone Encounter (Signed)
" °  FYI Only or Action Required?: FYI only for provider: appointment scheduled on 1/23.  Patient was last seen in primary care on 03/10/2024 by Avelina Greig BRAVO, MD.  Called Nurse Triage reporting fatigue  Symptoms began several months ago.  Interventions attempted: Rest, hydration, or home remedies.  Symptoms are: gradually worsening.  Triage Disposition: No disposition on file.  Patient/caregiver understands and will follow disposition?: Refused disposition      Message from Milam S sent at 06/20/2024  3:30 PM EST  Reason for Triage: patient has asthma issues. Feels run down and has aches. Spine issues and neck pain.     Reason for Disposition  [1] MODERATE weakness (e.g., interferes with work, school, normal activities) AND [2] persists > 3 days  Answer Assessment - Initial Assessment Questions Patient is calling to report she has noticed changes in how she is feeling- lack of energy, sleeping all the time, memory problems. Patient is concerned about this and wants to be seen. Offered appointment within disposition- but patient has to have late afternoon appointment due to her transportation.    1. DESCRIPTION: Describe how you are feeling.     Feels weak- doesn't want to get up, sleeps all the time- falls asleep 2. SEVERITY: How bad is it?  Can you stand and walk?     Moderate- weak, no energy 3. ONSET: When did these symptoms begin? (e.g., hours, days, weeks, months)     Patient states she has been sick over 1 year- she sees pulmonary for her asthma, spinal cervical stenosis, increased stress, memory issues 4. CAUSE: What do you think is causing the weakness or fatigue? (e.g., not drinking enough fluids, medical problem, trouble sleeping)     unsure 5. NEW MEDICINES:  Have you started on any new medicines recently? (e.g., opioid pain medicines, benzodiazepines, muscle relaxants, antidepressants, antihistamines, neuroleptics, beta blockers)     No- patient is  supposed to start a new medication for asthma- maintenance  6. OTHER SYMPTOMS: Do you have any other symptoms? (e.g., chest pain, fever, cough, SOB, vomiting, diarrhea, bleeding, other areas of pain)     Nausea, no appetite, problems emptying the bladder, incontinence- wears pad  Protocols used: Weakness (Generalized) and Fatigue-A-AH  "

## 2024-06-23 ENCOUNTER — Ambulatory Visit: Admitting: Family Medicine

## 2024-06-23 ENCOUNTER — Ambulatory Visit: Payer: Self-pay | Admitting: Family Medicine

## 2024-06-23 ENCOUNTER — Encounter: Payer: Self-pay | Admitting: Family Medicine

## 2024-06-23 VITALS — BP 128/78 | HR 68 | Temp 98.4°F | Ht 66.0 in | Wt 230.1 lb

## 2024-06-23 DIAGNOSIS — E039 Hypothyroidism, unspecified: Secondary | ICD-10-CM | POA: Diagnosis not present

## 2024-06-23 DIAGNOSIS — I10 Essential (primary) hypertension: Secondary | ICD-10-CM | POA: Diagnosis not present

## 2024-06-23 DIAGNOSIS — E559 Vitamin D deficiency, unspecified: Secondary | ICD-10-CM

## 2024-06-23 DIAGNOSIS — R339 Retention of urine, unspecified: Secondary | ICD-10-CM | POA: Diagnosis not present

## 2024-06-23 DIAGNOSIS — E1159 Type 2 diabetes mellitus with other circulatory complications: Secondary | ICD-10-CM | POA: Diagnosis not present

## 2024-06-23 DIAGNOSIS — R319 Hematuria, unspecified: Secondary | ICD-10-CM

## 2024-06-23 DIAGNOSIS — R5382 Chronic fatigue, unspecified: Secondary | ICD-10-CM | POA: Diagnosis not present

## 2024-06-23 DIAGNOSIS — Z794 Long term (current) use of insulin: Secondary | ICD-10-CM | POA: Diagnosis not present

## 2024-06-23 DIAGNOSIS — Z79899 Other long term (current) drug therapy: Secondary | ICD-10-CM | POA: Diagnosis not present

## 2024-06-23 DIAGNOSIS — R5383 Other fatigue: Secondary | ICD-10-CM | POA: Insufficient documentation

## 2024-06-23 DIAGNOSIS — F418 Other specified anxiety disorders: Secondary | ICD-10-CM | POA: Diagnosis not present

## 2024-06-23 DIAGNOSIS — G4733 Obstructive sleep apnea (adult) (pediatric): Secondary | ICD-10-CM | POA: Diagnosis not present

## 2024-06-23 LAB — POC URINALSYSI DIPSTICK (AUTOMATED)
Bilirubin, UA: NEGATIVE
Blood, UA: 10
Glucose, UA: POSITIVE — AB
Ketones, UA: NEGATIVE
Leukocytes, UA: NEGATIVE
Nitrite, UA: NEGATIVE
Protein, UA: NEGATIVE
Spec Grav, UA: 1.02
Urobilinogen, UA: 0.2 U/dL
pH, UA: 6

## 2024-06-23 NOTE — Patient Instructions (Addendum)
 I will put the referral in for our healthy weight and wellness clinic  Please let us  know if you don't hear in 1-2 weeks to set that up (mychart message or call or letter)   Labs today for fatigue   Keep using the cpap !   Take care of yourself

## 2024-06-23 NOTE — Progress Notes (Signed)
 "  Subjective:    Patient ID: Alexa Woods, female    DOB: 02-Sep-1960, 64 y.o.   MRN: [id number]  HPI  Wt Readings from Last 3 Encounters:  06/23/24 230 lb 2 oz (104.4 kg)  05/10/24 225 lb (102.1 kg)  04/21/24 226 lb (102.5 kg)   37.14 kg/m  Vitals:   06/23/24 1452  BP: 128/78  Pulse: 68  Temp: 98.4 F (36.9 C)  SpO2: 97%    Pt presents for c/o  Obesity  Fatigue  (weak all over)  Urinary retention (chronic)  Memory changes  Right thumb pain  Legs hurt- so much arthtisis   Can't remember stuff     Stress Daughter moved to [place] Has new grandchild -wants to see more   Sees pulmonary r [facility]  Breathing issues History of osa -sleep study consistent with severe osa  Now has cpap every day   Tezspire for severe persistent asthma   Found spinal stenosis from disk diseas on MRI-plans to see neuro surg [place] in her yard this summer / stiff neck and then more painful with time   Chronic pain all over   DM Farxiga   Had less appetite for a while and then it came back      Lab Results  Component Value Date   WBC 7.4 06/23/2024   HGB 13.8 06/23/2024   HCT 41.9 06/23/2024   MCV 83.3 06/23/2024   PLT 213 06/23/2024   Lab Results  Component Value Date   TSH 3.24 06/23/2024   Lab Results  Component Value Date   VITAMINB12 465 06/23/2024   Last vitamin D  Lab Results  Component Value Date   VD25OH 36 06/23/2024   Lab Results  Component Value Date   NA 141 06/23/2024   K 3.9 06/23/2024   CO2 31 06/23/2024   GLUCOSE 91 06/23/2024   BUN 18 06/23/2024   CREATININE 0.90 06/23/2024   CALCIUM  9.3 06/23/2024   GFR 51.67 (L) 01/04/2024   EGFR 73 06/23/2024   GFRNONAA >60 02/01/2024   Lab Results  Component Value Date   ALT 38 (H) 06/23/2024   AST 35 06/23/2024   ALKPHOS 110 01/04/2024   BILITOT 0.4 06/23/2024    Does not eat well  No appetite     Patient Active Problem List   Diagnosis Date Noted   OSA (obstructive sleep  apnea) 06/25/2024   Urinary retention 06/25/2024   Hematuria 06/25/2024   Diabetes mellitus (HCC) 06/25/2024   Fatigue 06/23/2024   Financial difficulties 05/09/2024   Mobility impaired 05/09/2024   Muscle cramps 01/04/2024   SOB (shortness of breath) on exertion 07/19/2023   CAD (coronary artery disease) 04/21/2023   Chest pain 04/13/2023   Right shoulder injury 03/16/2023   Pedal edema 01/01/2023   Frequent falls 08/14/2022   Statin myopathy 07/29/2022   Aortic atherosclerosis 07/29/2022   Chronic pain 02/10/2022   Transportation insecurity 05/09/2021   Financial insecurity 05/09/2021   Colon cancer screening 11/08/2020   Current use of proton pump inhibitor 11/04/2020   History of total knee replacement, right 10/21/2020   Trigger finger of right hand 12/20/2019   Dry eyes 09/05/2019   Intractable headache 08/25/2019   Hypothyroidism 04/04/2019   Screening mammogram, encounter for 04/03/2019   Routine general medical examination at a health care facility 04/03/2019   Encounter for screening for HIV 04/03/2019   Vitamin D  deficiency 04/03/2019   Intertrigo 02/21/2019   Chronic constipation 12/26/2017   History of left  knee replacement 12/26/2017   Primary osteoarthritis involving multiple joints 12/13/2017   Primary osteoarthritis of left knee 12/06/2017   Chronic neck pain 08/13/2017   Pain in left knee 06/22/2017   Morbid obesity (HCC) 01/21/2017   Joint swelling 12/20/2013   Periodontal disease 02/03/2013   Hyperlipidemia associated with type 2 diabetes mellitus (HCC) 09/27/2012   Cough 03/04/2011   Low back pain 07/22/2010   Epigastric pain 04/18/2010   Hypokalemia 12/12/2008   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   Type 2 diabetes mellitus with peripheral neuropathy (HCC) 11/30/2006   History of alcohol  abuse 11/30/2006   Depression with anxiety 11/30/2006   Essential hypertension 11/30/2006   Hemorrhoids 11/30/2006   Allergic rhinitis 11/30/2006   Moderate  asthma 11/30/2006   GERD without esophagitis 11/30/2006   Psoriasis 11/30/2006   Past Medical History:  Diagnosis Date   Alcohol  abuse, in remission    since 1998   Allergic rhinitis    Anemia    Anxiety    Asthma    Bilateral lower extremity edema    Bulging lumbar disc    Bulging of cervical intervertebral disc    Chronic constipation    DDD (degenerative disc disease), lumbosacral    Depression    Dyspnea    Gastroparesis    GERD (gastroesophageal reflux disease)    Hemorrhoids    History of Bell's palsy 08/2007   left   History of chronic cystitis    IBS (irritable bowel syndrome)    Insulin  dependent type 2 diabetes mellitus [facility])    endocrinologist-- dr trixie   Lower urinary tract symptoms (LUTS)    OA (osteoarthritis)    knees, back, hands, elbows   OSA (obstructive sleep apnea)    per study 06-08-2017 mild osa , cpap recommended , per pt insurance issue   Peripheral neuropathy    PONV (postoperative nausea and vomiting)    Psoriasis    S/P dilatation of esophageal stricture    Unspecified essential hypertension    Wears partial dentures    lower   Past Surgical History:  Procedure Laterality Date   CHOLECYSTECTOMY OPEN  1990   AND APPENDECTOMY   DOBUTAMINE  STRESS ECHO  2009    normal stress echo, no evidence of ischemia   ELBOW SURGERY Bilateral RIGHT 2013;  LEFT 2016   for nerve damage   ESOPHAGOGASTRODUODENOSCOPY  07/2002   erythematous gastropathy   eye sugery Bilateral    KNEE ARTHROSCOPY Left 11/ 2018   dr melodi  @ [facility]   KNEE ARTHROSCOPY W/ PARTIAL MEDIAL MENISCECTOMY Right 10/2008   and chondroplasty   SHOULDER ARTHROSCOPY WITH DISTAL CLAVICLE RESECTION Left 12/ 2011   dr dean   TOTAL KNEE ARTHROPLASTY Right 07-01-2009  dr melodi   [facility]   TOTAL KNEE ARTHROPLASTY Left 12/06/2017   Procedure: LEFT TOTAL LEFT KNEE ARTHROPLASTY;  Surgeon: Melodi Lerner, MD;  Location: [facility];  Service: Orthopedics;  Laterality: Left;   Trigger finger surgery  Left 07/2022   VAGINAL HYSTERECTOMY  2003   Social History[1] Family History  Problem Relation Age of Onset   Alcohol  abuse Mother    Hypertension Mother    Diabetes Mother    Heart failure Mother    Diabetes Sister    Heart attack Sister    Diabetes Sister    Diabetes Sister    Diabetes Sister    Celiac disease Daughter    Esophageal cancer Maternal Grandmother    Diabetes Brother    Parkinson's disease Brother  Heart attack Brother    Heart attack Brother 77   Diabetes Brother    Diabetes Brother    Diabetes Brother    Thyroid  disease Niece    Lung cancer Other        mat great uncle   Colon cancer Neg Hx    Breast cancer Neg Hx    Allergies[2] Medications Ordered Prior to Encounter[3]  Review of Systems  Constitutional:  Positive for appetite change and fatigue. Negative for activity change, fever and unexpected weight change.  HENT:  Negative for congestion, ear pain, rhinorrhea, sinus pressure and sore throat.   Eyes:  Negative for pain, redness and visual disturbance.  Respiratory:  Positive for wheezing. Negative for cough and shortness of breath.   Cardiovascular:  Negative for chest pain and palpitations.  Gastrointestinal:  Negative for abdominal pain, blood in stool, constipation and diarrhea.  Endocrine: Negative for polydipsia and polyuria.  Genitourinary:  Positive for difficulty urinating and frequency. Negative for dysuria and urgency.  Musculoskeletal:  Positive for arthralgias, back pain and gait problem. Negative for joint swelling and myalgias.  Skin:  Negative for pallor and rash.  Allergic/Immunologic: Negative for environmental allergies.  Neurological:  Negative for dizziness, syncope and headaches.  Hematological:  Negative for adenopathy. Does not bruise/bleed easily.  Psychiatric/Behavioral:  Positive for dysphoric mood and sleep disturbance. Negative for decreased concentration. The patient is nervous/anxious.        Objective:    Physical Exam Constitutional:      General: She is not in acute distress.    Appearance: Normal appearance. She is well-developed. She is obese. She is not ill-appearing or diaphoretic.  HENT:     Head: Normocephalic and atraumatic.     Mouth/Throat:     Mouth: Mucous membranes are moist.     Pharynx: Oropharynx is clear.  Eyes:     General:        Right eye: No discharge.        Left eye: No discharge.     Conjunctiva/sclera: Conjunctivae normal.     Pupils: Pupils are equal, round, and reactive to light.  Neck:     Thyroid : No thyromegaly.     Vascular: No carotid bruit or JVD.  Cardiovascular:     Rate and Rhythm: Normal rate and regular rhythm.     Heart sounds: Normal heart sounds.     No gallop.  Pulmonary:     Effort: Pulmonary effort is normal. No respiratory distress.     Breath sounds: Normal breath sounds. No stridor. No wheezing, rhonchi or rales.     Comments: No wheezing today Abdominal:     General: There is no distension or abdominal bruit.     Palpations: Abdomen is soft.  Musculoskeletal:     Cervical back: Normal range of motion and neck supple.     Right lower leg: No edema.     Left lower leg: No edema.  Lymphadenopathy:     Cervical: No cervical adenopathy.  Skin:    General: Skin is warm and dry.     Coloration: Skin is not pale.     Findings: No erythema or rash.  Neurological:     Mental Status: She is alert.     Coordination: Coordination normal.     Deep Tendon Reflexes: Reflexes are normal and symmetric. Reflexes normal.  Psychiatric:        Mood and Affect: Mood normal.  Assessment & Plan:   Problem List Items Addressed This Visit       Cardiovascular and Mediastinum   Essential hypertension    bp in fair control at this time  BP Readings from Last 1 Encounters:  06/23/24 128/78   No changes needed but will watch for symptoms of hypotension  Most recent labs reviewed  Disc lifstyle change with low sodium diet and  exercise  Stable with hctz 25 mg daily  Sees endo for dm2   Taking klor con  Lab today      Relevant Orders   CBC with Differential/Platelet (Completed)   TSH (Completed)   Comprehensive metabolic panel with GFR (Completed)     Respiratory   OSA (obstructive sleep apnea)   Now using cpap reliably  Does not note much improvement in fatigue so far         Endocrine   Hypothyroidism   More fatigued  TSH ordered  Continues levothyroxine  75 mcg daily      Relevant Orders   TSH (Completed)   Diabetes mellitus (HCC)   Neuropathy Vascular effects Under endocrinology care  On insulin   Not candidate for glp-1 due to gastroparesis  Diet and exercise are a challenge         Genitourinary   Urinary retention   This is chronic  No dysuria  Urinalysis with glucose (expected) and trace blood on dip  Sent for culture       Relevant Orders   POCT Urinalysis Dipstick (Automated) (Completed)   Urine Culture (Completed)     Other   Vitamin D  deficiency   D level today      Relevant Orders   VITAMIN D  25 Hydroxy (Vit-D Deficiency, Fractures) (Completed)   Morbid obesity (HCC)   Bmi 37.14 With co morbidities of HTN, DM2, hyperlipidemia and sleep apnea   Mobility impaired  Tries to eat low glycemic  Overall low appetite   Not candidate for glp-1 due to chronic nausea/gastroparesis   Interested in referral to the healthy weight and wellness center       Hematuria   Relevant Orders   Urine Culture (Completed)   Fatigue - Primary   Acute on chronic  In setting of chronic pain, mobility problems, morbid obesity, treated OSA and high stress (depression and anxiety) , hypothyroid   Lab today  Ref to weight mgmt clinic  Encouraged follow up with psychiatry         Relevant Orders   Iron (Completed)   CBC with Differential/Platelet (Completed)   Vitamin B12 (Completed)   TSH (Completed)   Depression with anxiety   Under psychiatric care No doubt adding to  fatigue and malaise       Current use of proton pump inhibitor   B12 and D levels ordered      Relevant Orders   Vitamin B12 (Completed)      [1]  Social History Tobacco Use   Smoking status: Former    Current packs/day: 0.00    Average packs/day: 1 pack/day for 25.0 years (25.0 ttl pk-yrs)    Types: Cigarettes    Start date: 12/30/1976    Quit date: 12/30/2001    Years since quitting: 22.5   Smokeless tobacco: Never  Vaping Use   Vaping status: Never Used  Substance Use Topics   Alcohol  use: No    Alcohol /week: 0.0 standard drinks of alcohol     Comment: hx alcoholism--  stopped 1998    Drug use: No  [  2]  Allergies Allergen Reactions   Bupropion Hcl Other (See Comments)    Pt is unsure   Cefuroxime Axetil Nausea Only   Crestor  [Rosuvastatin  Calcium ]     Pt states it makes her feel weird    Gabapentin  Other (See Comments)    Pt does not remember reaction  Pt does not remember reaction    Lidocaine Other (See Comments)    REACTION: unknown REACTION: unknown   Metformin Other (See Comments)    REACTION: GI REACTION: GI   Other Other (See Comments)   Paroxetine Other (See Comments)    REACTION: doesn't agree REACTION: doesn't agree   Propoxyphene Other (See Comments)    wheezing wheezing   Tramadol Other (See Comments)    REACTION: Causes Anxiety   Tramadol Hcl     REACTION: Causes Anxiety   Wellbutrin [Bupropion] Other (See Comments)    Pt is unsure   Codeine Nausea And Vomiting and Rash   Sulfa Antibiotics Rash and Other (See Comments)  [3]  Current Outpatient Medications on File Prior to Visit  Medication Sig Dispense Refill   albuterol  (VENTOLIN  HFA) 108 (90 Base) MCG/ACT inhaler INHALE 2 PUFFS INTO THE LUNGS EVERY 4 HOURS AS NEEDED FOR WHEEZING OR SHORTNESS OF BREATH. 18 each 0   Alcohol  Swabs (EASY COMFORT ALCOHOL  PADS) PADS Use 1 alcohol  pad before each fingerstick. 300 each 3   B-D UF III MINI PEN NEEDLES 31G X 5 MM MISC USE AS INSTRUCTED FOR 4X  DAILY INJECTIONS 400 each 1   BD INSULIN  SYRINGE U/F 31G X 5/16 0.5 ML MISC USE THREE TIMES PER DAY WITH R INSULIN  & ONCE DAILY WITH LEVEMIR  400 each 2   Blood Glucose Calibration (TRUE METRIX LEVEL 1) Low SOLN Use to run controls on glucometer. 1 each 2   budesonide -glycopyrrolate-formoterol  (BREZTRI  AEROSPHERE) 160-9-4.8 MCG/ACT AERO inhaler Inhale 2 puffs into the lungs in the morning and at bedtime. 1 each 6   budesonide -glycopyrrolate-formoterol  (BREZTRI  AEROSPHERE) 160-9-4.8 MCG/ACT AERO inhaler Inhale 2 puffs into the lungs in the morning and at bedtime. 11.8 g 0   cetirizine  (ZYRTEC  ALLERGY) 10 MG tablet Take 1 tablet (10 mg total) by mouth daily. 30 tablet 2   chlorhexidine  (PERIDEX ) 0.12 % solution Use as directed 15 mLs in the mouth or throat 2 (two) times daily. 120 mL 0   cholecalciferol (VITAMIN D3) 25 MCG (1000 UNIT) tablet Take 2,000 Units by mouth daily.     docusate sodium  (COLACE) 100 MG capsule Take 100 mg by mouth every morning.     ezetimibe  (ZETIA ) 10 MG tablet TAKE 1 TABLET BY MOUTH EVERY DAY 90 tablet 0   famotidine  (PEPCID ) 20 MG tablet TAKE 1 TABLET BY MOUTH TWICE A DAY 180 tablet 0   FARXIGA  10 MG TABS tablet TAKE 1 TABLET BY MOUTH DAILY BEFORE BREAKFAST. 90 tablet 3   fluticasone  (FLONASE ) 50 MCG/ACT nasal spray PLACE 2 SPRAYS INTO BOTH NOSTRILS DAILY AS NEEDED FOR ALLERGIES OR RHINITIS. 48 mL 1   glucose blood (ACCU-CHEK GUIDE TEST) test strip USE TO CHECK BLOOD SUGAR 4-5 TIMES A DAY. 500 strip 12   hydrochlorothiazide  (HYDRODIURIL ) 25 MG tablet TAKE 1 TABLET (25 MG TOTAL) BY MOUTH DAILY. 90 tablet 1   insulin  glargine (LANTUS  SOLOSTAR) 100 UNIT/ML Solostar Pen INJECT 30-35 UNITS DAILY 45 mL 1   Insulin  Pen Needle 32G X 4 MM MISC Use 4x a day 300 each 3   Insulin  Regular Human (NOVOLIN  R FLEXPEN RELION) 100 UNIT/ML KwikPen  Inject 18-25 units under skin before each meal 45 mL 3   ipratropium-albuterol  (DUONEB) 0.5-2.5 (3) MG/3ML SOLN Take 3 mLs by nebulization every 4  (four) hours as needed. 360 mL 12   ketoconazole  (NIZORAL ) 2 % cream APPLY 1 APPLICATION TOPICALLY DAILY AS NEEDED FOR IRRITATION. 30 g 1   Lancets (ONETOUCH ULTRASOFT) lancets Use to test blood sugar 4 times daily as instructed. 200 each 11   levothyroxine  (SYNTHROID ) 75 MCG tablet Take 1 tablet (75 mcg total) by mouth daily. 90 tablet 3   LORazepam  (ATIVAN ) 2 MG tablet Take 2 mg by mouth 2 (two) times daily.     naloxone (NARCAN) 4 MG/0.1ML LIQD nasal spray kit      oxyCODONE -acetaminophen  (PERCOCET) 10-325 MG tablet Take 1 tablet by mouth every 4 (four) hours as needed for pain. Hold for SBP < = 105 12 tablet 0   pantoprazole  (PROTONIX ) 40 MG tablet Take 40 mg by mouth daily. In am 30 min before food or medication or vitamins     potassium chloride  (KLOR-CON  M) 10 MEQ tablet TAKE 1 TABLET BY MOUTH EVERY DAY 90 tablet 1   sertraline  (ZOLOFT ) 100 MG tablet Take 200 mg by mouth every morning.     Spacer/Aero-Holding Chambers (OPTICHAMBER DIAMOND) MISC USE AS INSTRUCTED 1 each 0   tiZANidine  (ZANAFLEX ) 4 MG tablet Take 4 mg by mouth 2 (two) times daily as needed for muscle spasms.     triamcinolone  cream (KENALOG ) 0.1 % Apply 1 Application topically 2 (two) times daily.     zolpidem  (AMBIEN ) 10 MG tablet Take 10 mg by mouth at bedtime as needed for sleep.     No current facility-administered medications on file prior to visit.   "

## 2024-06-24 LAB — CBC WITH DIFFERENTIAL/PLATELET
Absolute Lymphocytes: 1628 {cells}/uL (ref 850–3900)
Absolute Monocytes: 429 {cells}/uL (ref 200–950)
Basophils Absolute: 59 {cells}/uL (ref 0–200)
Basophils Relative: 0.8 %
Eosinophils Absolute: 148 {cells}/uL (ref 15–500)
Eosinophils Relative: 2 %
HCT: 41.9 % (ref 35.9–46.0)
Hemoglobin: 13.8 g/dL (ref 11.7–15.5)
MCH: 27.4 pg (ref 27.0–33.0)
MCHC: 32.9 g/dL (ref 31.6–35.4)
MCV: 83.3 fL (ref 81.4–101.7)
MPV: 11.1 fL (ref 7.5–12.5)
Monocytes Relative: 5.8 %
Neutro Abs: 5136 {cells}/uL (ref 1500–7800)
Neutrophils Relative %: 69.4 %
Platelets: 213 10*3/uL (ref 140–400)
RBC: 5.03 Million/uL (ref 3.80–5.10)
RDW: 13.2 % (ref 11.0–15.0)
Total Lymphocyte: 22 %
WBC: 7.4 10*3/uL (ref 3.8–10.8)

## 2024-06-24 LAB — COMPREHENSIVE METABOLIC PANEL WITH GFR
AG Ratio: 1.3 (calc) (ref 1.0–2.5)
ALT: 38 U/L — ABNORMAL HIGH (ref 6–29)
AST: 35 U/L (ref 10–35)
Albumin: 4.1 g/dL (ref 3.6–5.1)
Alkaline phosphatase (APISO): 195 U/L — ABNORMAL HIGH (ref 37–153)
BUN: 18 mg/dL (ref 7–25)
CO2: 31 mmol/L (ref 20–32)
Calcium: 9.3 mg/dL (ref 8.6–10.4)
Chloride: 102 mmol/L (ref 98–110)
Creat: 0.9 mg/dL (ref 0.50–1.05)
Globulin: 3.2 g/dL (ref 1.9–3.7)
Glucose, Bld: 91 mg/dL (ref 65–99)
Potassium: 3.9 mmol/L (ref 3.5–5.3)
Sodium: 141 mmol/L (ref 135–146)
Total Bilirubin: 0.4 mg/dL (ref 0.2–1.2)
Total Protein: 7.3 g/dL (ref 6.1–8.1)
eGFR: 73 mL/min/{1.73_m2}

## 2024-06-24 LAB — TSH: TSH: 3.24 m[IU]/L (ref 0.40–4.50)

## 2024-06-24 LAB — VITAMIN D 25 HYDROXY (VIT D DEFICIENCY, FRACTURES): Vit D, 25-Hydroxy: 36 ng/mL (ref 30–100)

## 2024-06-24 LAB — URINE CULTURE
MICRO NUMBER:: 17508510
Result:: NO GROWTH
SPECIMEN QUALITY:: ADEQUATE

## 2024-06-24 LAB — VITAMIN B12: Vitamin B-12: 465 pg/mL (ref 200–1100)

## 2024-06-24 LAB — IRON: Iron: 73 ug/dL (ref 45–160)

## 2024-06-25 DIAGNOSIS — G4733 Obstructive sleep apnea (adult) (pediatric): Secondary | ICD-10-CM | POA: Insufficient documentation

## 2024-06-25 DIAGNOSIS — E119 Type 2 diabetes mellitus without complications: Secondary | ICD-10-CM | POA: Insufficient documentation

## 2024-06-25 DIAGNOSIS — R339 Retention of urine, unspecified: Secondary | ICD-10-CM | POA: Insufficient documentation

## 2024-06-25 DIAGNOSIS — R319 Hematuria, unspecified: Secondary | ICD-10-CM | POA: Insufficient documentation

## 2024-06-25 NOTE — Assessment & Plan Note (Signed)
 D level today

## 2024-06-25 NOTE — Assessment & Plan Note (Signed)
 Under psychiatric care No doubt adding to fatigue and malaise

## 2024-06-25 NOTE — Assessment & Plan Note (Signed)
 Bmi 37.14 With co morbidities of HTN, DM2, hyperlipidemia and sleep apnea   Mobility impaired  Tries to eat low glycemic  Overall low appetite   Not candidate for glp-1 due to chronic nausea/gastroparesis   Interested in referral to the healthy weight and wellness center

## 2024-06-25 NOTE — Assessment & Plan Note (Signed)
 Neuropathy Vascular effects Under endocrinology care  On insulin   Not candidate for glp-1 due to gastroparesis  Diet and exercise are a challenge

## 2024-06-25 NOTE — Assessment & Plan Note (Signed)
 Now using cpap reliably  Does not note much improvement in fatigue so far

## 2024-06-25 NOTE — Assessment & Plan Note (Signed)
 This is chronic  No dysuria  Urinalysis with glucose (expected) and trace blood on dip  Sent for culture

## 2024-06-25 NOTE — Assessment & Plan Note (Signed)
 More fatigued  TSH ordered  Continues levothyroxine  75 mcg daily

## 2024-06-25 NOTE — Assessment & Plan Note (Signed)
 Acute on chronic  In setting of chronic pain, mobility problems, morbid obesity, treated OSA and high stress (depression and anxiety) , hypothyroid   Lab today  Ref to weight mgmt clinic  Encouraged follow up with psychiatry

## 2024-06-25 NOTE — Assessment & Plan Note (Signed)
 B12 and D levels ordered

## 2024-06-25 NOTE — Assessment & Plan Note (Signed)
" °  bp in fair control at this time  BP Readings from Last 1 Encounters:  06/23/24 128/78   No changes needed but will watch for symptoms of hypotension  Most recent labs reviewed  Disc lifstyle change with low sodium diet and exercise  Stable with hctz 25 mg daily  Sees endo for dm2   Taking klor con  Lab today "

## 2024-06-27 NOTE — Telephone Encounter (Signed)
 Patient came to clinic on 06/19/24 - office made several attempts to reschedule this appt as Tezspire samples were not available on this date. Used demo device for injection training and provided detailed education.   She ultimately wanted to schedule a second appointment when Tezspire samples were in stock. Significant scheduling barriers.   I confirmed that we have Tezspire injection samples in stock today, 06/27/24. Contact patient and scheduled for appt at 3:45 PM on Monday, 07/04/24.

## 2024-06-30 ENCOUNTER — Ambulatory Visit: Payer: Self-pay

## 2024-06-30 ENCOUNTER — Emergency Department

## 2024-06-30 ENCOUNTER — Other Ambulatory Visit: Payer: Self-pay

## 2024-06-30 ENCOUNTER — Emergency Department
Admission: EM | Admit: 2024-06-30 | Discharge: 2024-06-30 | Disposition: A | Discharge status: Home / Self Care | Attending: Emergency Medicine | Admitting: Emergency Medicine

## 2024-06-30 DIAGNOSIS — I10 Essential (primary) hypertension: Secondary | ICD-10-CM | POA: Insufficient documentation

## 2024-06-30 DIAGNOSIS — J45909 Unspecified asthma, uncomplicated: Secondary | ICD-10-CM | POA: Diagnosis not present

## 2024-06-30 DIAGNOSIS — E119 Type 2 diabetes mellitus without complications: Secondary | ICD-10-CM | POA: Insufficient documentation

## 2024-06-30 DIAGNOSIS — S3210XA Unspecified fracture of sacrum, initial encounter for closed fracture: Secondary | ICD-10-CM | POA: Insufficient documentation

## 2024-06-30 DIAGNOSIS — S3992XA Unspecified injury of lower back, initial encounter: Secondary | ICD-10-CM | POA: Diagnosis present

## 2024-06-30 DIAGNOSIS — S50312A Abrasion of left elbow, initial encounter: Secondary | ICD-10-CM | POA: Diagnosis not present

## 2024-06-30 DIAGNOSIS — Y92009 Unspecified place in unspecified non-institutional (private) residence as the place of occurrence of the external cause: Secondary | ICD-10-CM | POA: Insufficient documentation

## 2024-06-30 DIAGNOSIS — E039 Hypothyroidism, unspecified: Secondary | ICD-10-CM | POA: Insufficient documentation

## 2024-06-30 DIAGNOSIS — W000XXA Fall on same level due to ice and snow, initial encounter: Secondary | ICD-10-CM | POA: Insufficient documentation

## 2024-06-30 MED ORDER — IBUPROFEN 600 MG PO TABS
600.0000 mg | ORAL_TABLET | Freq: Once | ORAL | Status: AC
  Administered 2024-06-30: 600 mg via ORAL
  Filled 2024-06-30: qty 1

## 2024-06-30 NOTE — ED Provider Notes (Signed)
 "  [facility] Provider Note    Event Date/Time   First MD Initiated Contact with Patient 06/30/24 1843     (approximate)   History   Fall   HPI  Alexa Woods is a 64 y.o. female  with a past medical history of type 2 diabetes, depression, anxiety, hypertension, asthma, GERD, psoriasis, history of alcohol  abuse, hypothyroidism, history of Bell's palsy, IBS, OSA presents to the emergency department with mechanical fall onto her tailbone and lower back today after slipping on ice while outside at her home today.  She also reports left elbow pain after hitting it on the ground.  She reports the fall was witnessed by her neighbor.  She denies any pain prior to the fall.  She denies bowel or bladder incontinence, hitting her head, head or neck pain, loss consciousness, lightheadedness or dizziness, chest pain or shortness of breath, any other injuries.  She reports she is already on a Percocet for chronic pain management.  Physical Exam   Triage Vital Signs: ED Triage Vitals  Encounter Vitals Group     BP 06/30/24 1758 104/84     Girls Systolic BP Percentile --      Girls Diastolic BP Percentile --      Boys Systolic BP Percentile --      Boys Diastolic BP Percentile --      Pulse Rate 06/30/24 1758 65     Resp 06/30/24 1758 18     Temp 06/30/24 1758 98.1 F (36.7 C)     Temp Source 06/30/24 1758 Oral     SpO2 06/30/24 1758 92 %     Weight 06/30/24 1813 229 lb 4.5 oz (104 kg)     Height 06/30/24 1813 5' 6 (1.676 m)     Head Circumference --      Peak Flow --      Pain Score 06/30/24 1813 7     Pain Loc --      Pain Education --      Exclude from Growth Chart --     Most recent vital signs: Vitals:   06/30/24 1758 06/30/24 2155  BP: 104/84 (!) 150/67  Pulse: 65 63  Resp: 18 17  Temp: 98.1 F (36.7 C) 98.2 F (36.8 C)  SpO2: 92% 94%    General: Awake, in no acute distress. Appears stated age. Head: Normocephalic, atraumatic. Neck: Supple, no  midline cervical tenderness. CV: Good peripheral perfusion. No edema. RRR, 63 bpm. Respiratory:Normal respiratory effort.  No respiratory distress. CTAB. GI: Soft, non-distended, non-tender.  MSK: Normal ROM and  5/5 strength in b/l upper and lower extremities. TTP along midline sacral region. Skin:Warm, dry, intact. No ecchymosis.  Abrasion to the left elbow, mildly TTP. Neurological: A&Ox4 to person, place, time, and situation.   ED Results / Procedures / Treatments   Labs (all labs ordered are listed, but only abnormal results are displayed) Labs Reviewed - No data to display   EKG     RADIOLOGY Sacrum/Coccyx X Ray FINDINGS: Frontal and lateral views of the sacrum and coccyx are obtained. There is a minimally displaced fracture of the distal sacrum just proximal to the sacrococcygeal junction, only seen on the lateral view. Alignment is near anatomic. No other acute bony abnormalities. Mild degenerative changes at the lumbosacral junction.   IMPRESSION: 1. Minimally displaced distal sacral fracture just proximal to the sacrococcygeal junction, with near anatomic alignment.   Lumbar Spine X Ray FINDINGS: Frontal and lateral views of  the lumbar spine are obtained on 3 images. Five non-rib-bearing lumbar type vertebral bodies are in normal alignment. Minimally displaced distal sacral fracture just proximal to the sacrococcygeal junction, with near anatomic alignment. Mild multilevel spondylosis and facet hypertrophy greatest at the L5-S1 level. Sacroiliac joints are unremarkable.   IMPRESSION: 1. Minimally displaced distal sacral fracture only well seen on lateral view. Near anatomic alignment. 2. Lower lumbar degenerative changes.  Left Elbow X Ray FINDINGS: Frontal, bilateral oblique, lateral views of the left elbow are obtained on 4 images. No acute fracture, subluxation, or dislocation. Mild osteoarthritis. No joint effusion. Soft tissues are unremarkable.    IMPRESSION: 1. No acute displaced fracture. 2. Mild osteoarthritis.    PROCEDURES:  Critical Care performed: No   Procedures   MEDICATIONS ORDERED IN ED: Medications  ibuprofen  (ADVIL ) tablet 600 mg (600 mg Oral Given 06/30/24 2005)     IMPRESSION / MDM / ASSESSMENT AND PLAN / ED COURSE  I reviewed the triage vital signs and the nursing notes.                              Differential diagnosis includes, but is not limited to, sacral fracture, mechanical fall, lumbar strain, gluteal strain or contusion  Patient's presentation is most consistent with acute complicated illness / injury requiring diagnostic workup.  Patient is a 64 year old female presenting with a mechanical fall and pain in her buttocks region after the fall.  Fall was witnessed.  She is neurovascularly intact.  She did not hit her head.  Patient is overall well-appearing on exam.  She is already on a chronic pain regimen that she reports.  X-rays of lumbar spine and sacrum coccyx ordered.  Also ordered left elbow x-ray given she fell on this elbow as well and has an abrasion to the area. I independently viewed the x-rays and radiologist's reports.  I agree with the radiologist's report that there is a minimally displaced distal sacral fracture with near anatomical alignment seen on lumbar spine and sacrum/coccyx x-rays.  No abnormalities found on left elbow x-ray.  She has no bowel or bladder incontinence, is able to ambulate following the incident.  Discussed with her the need to pick up a donut seat at any store to sit on it for comfort and continue her pain regimen she is already on, use ice and heat for pain.  Discussed wound care instructions regarding the elbow abrasion.  She will follow-up with her primary care provider following today's fall.  The patient may return to the emergency department for any new, worsening, or concerning symptoms. Patient was given the opportunity to ask questions; all questions were  answered. Emergency department return precautions were discussed with the patient.  Patient is in agreement to the treatment plan.  Patient is stable for discharge.   FINAL CLINICAL IMPRESSION(S) / ED DIAGNOSES   Final diagnoses:  Closed fracture of sacrum, unspecified fracture morphology, initial encounter (HCC)  Elbow abrasion, left, initial encounter     Rx / DC Orders   ED Discharge Orders     None        Note:  This document was prepared using Dragon voice recognition software and may include unintentional dictation errors.     Sheron Edith Endave, [place] 07/01/24 1107  "

## 2024-06-30 NOTE — Discharge Instructions (Addendum)
 You were seen in the emergency department for a fracture of part of your tailbone.  Please purchase a donut seat on [employer] or at [place] to sit on for the next 4 to 6 weeks as the area heals.  Continue your regular pain medication regimen at home.  You may wash your elbow with soap and water where the abrasion (cut) is.  Follow up with your regular doctor following today's visit. Return to the ER or call your doctor if you experience any numbness or tingling of your bowel/bladder area, lose control of your bowels/bladder, or any other new, worsening or concerning symptoms.

## 2024-06-30 NOTE — ED Triage Notes (Signed)
 Patient reports mechanical fall on ice onto tailbone. Patient c/o tailbone and back pain. Denies head injury, loc, blood thinners.

## 2024-06-30 NOTE — Telephone Encounter (Signed)
 FYI Only or Action Required?: FYI only for provider: UC advised.  Patient was last seen in primary care on 06/23/2024 by Alexa Laine LABOR, MD.  Called Nurse Triage reporting Tailbone Pain.  Symptoms began today.  Interventions attempted: Prescription medications: Oxycodone .  Symptoms are: gradually worsening.  Triage Disposition: See HCP Within 4 Hours (Or PCP Triage)  Patient/caregiver understands and will follow disposition?: Yes  Reason for Disposition  Pain radiates into the thigh or further down the leg now  Answer Assessment - Initial Assessment Questions Patient reports falling about an hour ago and injuring her coccyx. She reports 8/10 pain and states that she is feeling pain down into her legs. UC advised. States she will go to ED.   1. MECHANISM: How did the injury happen?       Patient states that she fell off bottom step outside and hit her tailbone  2. ONSET: When did the injury happen? (e.g., minutes, hours ago)      About 1 hour ago  3. LOCATION: Where is the injury located?      Coccyx  4. SEVERITY: Can you sit? Can you walk?      Able to walk, very painful  5. PAIN: Is there pain? If Yes, ask: How bad is the pain?  (Scale 0-10; none, mild, moderate, or severe)     Yes, 8/10  6. SIZE: For bruises, or swelling, ask: How large is it? (e.g., inches or centimeters)      NA  7. OTHER SYMPTOMS: Do you have any other symptoms? (e.g., numbness, back pain)     Reports pain in legs  8. PREGNANCY: Is there any chance you are pregnant? When was your last menstrual period?     NA  Protocols used: Tailbone Injury-A-AH   Reason for Triage: Patient fell off bottom step outside a little while ago - having pain in tailbone

## 2024-07-01 NOTE — Telephone Encounter (Signed)
 Aware, will watch for correspondence

## 2024-07-03 ENCOUNTER — Ambulatory Visit: Payer: Self-pay

## 2024-07-03 ENCOUNTER — Other Ambulatory Visit: Payer: Self-pay | Admitting: Internal Medicine

## 2024-07-03 ENCOUNTER — Other Ambulatory Visit

## 2024-07-04 ENCOUNTER — Ambulatory Visit: Payer: Self-pay

## 2024-07-04 NOTE — Telephone Encounter (Signed)
Aware Will see her then

## 2024-07-05 ENCOUNTER — Encounter (INDEPENDENT_AMBULATORY_CARE_PROVIDER_SITE_OTHER): Payer: Self-pay

## 2024-07-07 ENCOUNTER — Encounter: Payer: Self-pay | Admitting: Family Medicine

## 2024-07-07 ENCOUNTER — Ambulatory Visit: Admitting: Family Medicine

## 2024-07-07 VITALS — BP 128/68 | HR 74 | Temp 97.4°F | Ht 66.0 in | Wt 232.2 lb

## 2024-07-07 DIAGNOSIS — G894 Chronic pain syndrome: Secondary | ICD-10-CM

## 2024-07-07 DIAGNOSIS — R8290 Unspecified abnormal findings in urine: Secondary | ICD-10-CM | POA: Insufficient documentation

## 2024-07-07 DIAGNOSIS — S3210XA Unspecified fracture of sacrum, initial encounter for closed fracture: Secondary | ICD-10-CM | POA: Insufficient documentation

## 2024-07-07 DIAGNOSIS — R7401 Elevation of levels of liver transaminase levels: Secondary | ICD-10-CM | POA: Insufficient documentation

## 2024-07-07 DIAGNOSIS — S3210XS Unspecified fracture of sacrum, sequela: Secondary | ICD-10-CM

## 2024-07-07 DIAGNOSIS — F418 Other specified anxiety disorders: Secondary | ICD-10-CM

## 2024-07-07 NOTE — Progress Notes (Unsigned)
 "  Referring Physician:  [name], MD [address] [place],  [place] [address]  Primary Physician:  Tower, Laine LABOR, MD  History of Present Illness: 07/07/2024*** Ms. Alexa Woods has a history of ***  Neck pain  Fell on ice on 06/30/2024-hurt her back and left elbow  Duration: *** Location: *** Quality: *** Severity: ***  Precipitating: aggravated by *** Modifying factors: made better by *** Weakness: none Timing: ***  Tobacco use: smokes *** PPD x years. Does not smoke.   Bowel/Bladder Dysfunction: none  Conservative measures:  Physical therapy: *** has not participated in Multimodal medical therapy including regular antiinflammatories: *** Tizanidine , Meloxicam  Injections: ***no epidural steroid injections  Past Surgery: ***no spine surgeries  GRAYCEE GREESON has ***no symptoms of cervical myelopathy.  The symptoms are causing a significant impact on the patient's life.   Review of Systems:  A 10 point review of systems is negative, except for the pertinent positives and negatives detailed in the HPI.  Past Medical History: Past Medical History:  Diagnosis Date   Alcohol  abuse, in remission    since 1998   Allergic rhinitis    Anemia    Anxiety    Asthma    Bilateral lower extremity edema    Bulging lumbar disc    Bulging of cervical intervertebral disc    Chronic constipation    DDD (degenerative disc disease), lumbosacral    Depression    Dyspnea    Gastroparesis    GERD (gastroesophageal reflux disease)    Hemorrhoids    History of Bell's palsy 08/2007   left   History of chronic cystitis    IBS (irritable bowel syndrome)    Insulin  dependent type 2 diabetes mellitus [facility])    endocrinologist-- dr trixie   Lower urinary tract symptoms (LUTS)    OA (osteoarthritis)    knees, back, hands, elbows   OSA (obstructive sleep apnea)    per study 06-08-2017 mild osa , cpap recommended , per pt insurance issue   Peripheral  neuropathy    PONV (postoperative nausea and vomiting)    Psoriasis    S/P dilatation of esophageal stricture    Unspecified essential hypertension    Wears partial dentures    lower    Past Surgical History: Past Surgical History:  Procedure Laterality Date   CHOLECYSTECTOMY OPEN  1990   AND APPENDECTOMY   DOBUTAMINE  STRESS ECHO  2009    normal stress echo, no evidence of ischemia   ELBOW SURGERY Bilateral RIGHT 2013;  LEFT 2016   for nerve damage   ESOPHAGOGASTRODUODENOSCOPY  07/2002   erythematous gastropathy   eye sugery Bilateral    KNEE ARTHROSCOPY Left 11/ 2018   dr melodi  @ [facility]   KNEE ARTHROSCOPY W/ PARTIAL MEDIAL MENISCECTOMY Right 10/2008   and chondroplasty   SHOULDER ARTHROSCOPY WITH DISTAL CLAVICLE RESECTION Left 12/ 2011   dr dean   TOTAL KNEE ARTHROPLASTY Right 07-01-2009  dr melodi   [facility]   TOTAL KNEE ARTHROPLASTY Left 12/06/2017   Procedure: LEFT TOTAL LEFT KNEE ARTHROPLASTY;  Surgeon: Melodi Lerner, MD;  Location: [facility];  Service: Orthopedics;  Laterality: Left;   Trigger finger surgery Left 07/2022   VAGINAL HYSTERECTOMY  2003    Allergies: Allergies as of 07/13/2024 - Review Complete 06/30/2024  Allergen Reaction Noted   Bupropion hcl Other (See Comments)    Cefuroxime axetil Nausea Only 05/13/2011   Crestor  [rosuvastatin  calcium ]  07/02/2021   Gabapentin  Other (See  Comments) 12/06/2017   Lidocaine Other (See Comments) 02/16/2007   Metformin Other (See Comments) 11/30/2006   Other Other (See Comments) 09/11/2019   Paroxetine Other (See Comments) 11/30/2006   Propoxyphene Other (See Comments) 11/30/2017   Tramadol Other (See Comments) 03/10/2010   Tramadol hcl  03/10/2010   Wellbutrin [bupropion] Other (See Comments) 09/11/2019   Codeine Nausea And Vomiting and Rash 11/30/2006   Sulfa antibiotics Rash and Other (See Comments) 11/30/2017    Medications: Outpatient Encounter Medications as of 07/13/2024  Medication Sig   albuterol  (VENTOLIN   HFA) 108 (90 Base) MCG/ACT inhaler INHALE 2 PUFFS INTO THE LUNGS EVERY 4 HOURS AS NEEDED FOR WHEEZING OR SHORTNESS OF BREATH.   Alcohol  Swabs (EASY COMFORT ALCOHOL  PADS) PADS Use 1 alcohol  pad before each fingerstick.   B-D UF III MINI PEN NEEDLES 31G X 5 MM MISC USE AS INSTRUCTED FOR 4X DAILY INJECTIONS   BD INSULIN  SYRINGE U/F 31G X 5/16 0.5 ML MISC USE THREE TIMES PER DAY WITH R INSULIN  & ONCE DAILY WITH LEVEMIR    Blood Glucose Calibration (TRUE METRIX LEVEL 1) Low SOLN Use to run controls on glucometer.   budesonide -glycopyrrolate-formoterol  (BREZTRI  AEROSPHERE) 160-9-4.8 MCG/ACT AERO inhaler Inhale 2 puffs into the lungs in the morning and at bedtime.   budesonide -glycopyrrolate-formoterol  (BREZTRI  AEROSPHERE) 160-9-4.8 MCG/ACT AERO inhaler Inhale 2 puffs into the lungs in the morning and at bedtime.   cetirizine  (ZYRTEC  ALLERGY) 10 MG tablet Take 1 tablet (10 mg total) by mouth daily.   chlorhexidine  (PERIDEX ) 0.12 % solution Use as directed 15 mLs in the mouth or throat 2 (two) times daily.   cholecalciferol (VITAMIN D3) 25 MCG (1000 UNIT) tablet Take 2,000 Units by mouth daily.   docusate sodium  (COLACE) 100 MG capsule Take 100 mg by mouth every morning.   ezetimibe  (ZETIA ) 10 MG tablet TAKE 1 TABLET BY MOUTH EVERY DAY   famotidine  (PEPCID ) 20 MG tablet TAKE 1 TABLET BY MOUTH TWICE A DAY   FARXIGA  10 MG TABS tablet TAKE 1 TABLET BY MOUTH DAILY BEFORE BREAKFAST.   fluticasone  (FLONASE ) 50 MCG/ACT nasal spray PLACE 2 SPRAYS INTO BOTH NOSTRILS DAILY AS NEEDED FOR ALLERGIES OR RHINITIS.   glucose blood (ACCU-CHEK GUIDE TEST) test strip USE TO CHECK BLOOD SUGAR 4-5 TIMES A DAY.   hydrochlorothiazide  (HYDRODIURIL ) 25 MG tablet TAKE 1 TABLET (25 MG TOTAL) BY MOUTH DAILY.   insulin  glargine (LANTUS  SOLOSTAR) 100 UNIT/ML Solostar Pen INJECT 30-35 UNITS DAILY   Insulin  Pen Needle 32G X 4 MM MISC Use 4x a day   Insulin  Regular Human (NOVOLIN  R FLEXPEN RELION) 100 UNIT/ML KwikPen Inject 20 units  before b'fast 10-18 units before lunch 10-18 units before dinner   ipratropium-albuterol  (DUONEB) 0.5-2.5 (3) MG/3ML SOLN Take 3 mLs by nebulization every 4 (four) hours as needed.   ketoconazole  (NIZORAL ) 2 % cream APPLY 1 APPLICATION TOPICALLY DAILY AS NEEDED FOR IRRITATION.   Lancets (ONETOUCH ULTRASOFT) lancets Use to test blood sugar 4 times daily as instructed.   levothyroxine  (SYNTHROID ) 75 MCG tablet Take 1 tablet (75 mcg total) by mouth daily.   LORazepam  (ATIVAN ) 2 MG tablet Take 2 mg by mouth 2 (two) times daily.   naloxone (NARCAN) 4 MG/0.1ML LIQD nasal spray kit    oxyCODONE -acetaminophen  (PERCOCET) 10-325 MG tablet Take 1 tablet by mouth every 4 (four) hours as needed for pain. Hold for SBP < = 105   pantoprazole  (PROTONIX ) 40 MG tablet Take 40 mg by mouth daily. In am 30 min before food or  medication or vitamins   potassium chloride  (KLOR-CON  M) 10 MEQ tablet TAKE 1 TABLET BY MOUTH EVERY DAY   sertraline  (ZOLOFT ) 100 MG tablet Take 200 mg by mouth every morning.   Spacer/Aero-Holding Chambers (OPTICHAMBER DIAMOND) MISC USE AS INSTRUCTED   tiZANidine  (ZANAFLEX ) 4 MG tablet Take 4 mg by mouth 2 (two) times daily as needed for muscle spasms.   triamcinolone  cream (KENALOG ) 0.1 % Apply 1 Application topically 2 (two) times daily.   zolpidem  (AMBIEN ) 10 MG tablet Take 10 mg by mouth at bedtime as needed for sleep.   No facility-administered encounter medications on file as of 07/13/2024.    Social History: Social History[1]  Family Medical History: Family History  Problem Relation Age of Onset   Alcohol  abuse Mother    Hypertension Mother    Diabetes Mother    Heart failure Mother    Diabetes Sister    Heart attack Sister    Diabetes Sister    Diabetes Sister    Diabetes Sister    Celiac disease Daughter    Esophageal cancer Maternal Grandmother    Diabetes Brother    Parkinson's disease Brother    Heart attack Brother    Heart attack Brother 28   Diabetes Brother     Diabetes Brother    Diabetes Brother    Thyroid  disease Niece    Lung cancer Other        mat great uncle   Colon cancer Neg Hx    Breast cancer Neg Hx     Physical Examination: There were no vitals filed for this visit.  General: Patient is well developed, well nourished, calm, collected, and in no apparent distress. Attention to examination is appropriate.  Respiratory: Patient is breathing without any difficulty.   NEUROLOGICAL:     Awake, alert, oriented to person, place, and time.  Speech is clear and fluent. Fund of knowledge is appropriate.   Cranial Nerves: Pupils equal round and reactive to light.  Facial tone is symmetric.    *** ROM of cervical spine *** pain *** posterior cervical tenderness. *** tenderness in bilateral trapezial region.   *** ROM of lumbar spine *** pain *** posterior lumbar tenderness.   No abnormal lesions on exposed skin.   Strength: Side Biceps Triceps Deltoid Interossei Grip Wrist Ext. Wrist Flex.  R 5 5 5 5 5 5 5   L 5 5 5 5 5 5 5    Side Iliopsoas Quads Hamstring PF DF EHL  R 5 5 5 5 5 5   L 5 5 5 5 5 5    Reflexes are ***2+ and symmetric at the biceps, brachioradialis, patella and achilles.   Hoffman's is absent.  Clonus is not present.   Bilateral upper and lower extremity sensation is intact to light touch.     Gait is normal.   ***No difficulty with tandem gait.    Medical Decision Making  Imaging: ***  I have personally reviewed the images and agree with the above interpretation.  Assessment and Plan: Ms. Nappier is a pleasant 64 y.o. female has ***  Treatment options discussed with patient and following plan made:   - Order for physical therapy for *** spine ***. Patient to call to schedule appointment. *** - Continue current medications including ***. Reviewed dosing and side effects.  - Prescription for ***. Reviewed dosing and side effects. Take with food.  - Prescription for *** to take prn muscle spasms. Reviewed  dosing and side effects. Discussed this can  cause drowsiness.  - MRI of *** to further evaluate *** radiculopathy. No improvement time or medications (***).  - Referral to [facility] at [facility] to discuss possible *** injections.  - Will schedule phone visit to review MRI results once I get them back.   I spent a total of *** minutes in face-to-face and non-face-to-face activities related to this patient's care today including review of outside records, review of imaging, review of symptoms, physical exam, discussion of differential diagnosis, discussion of treatment options, and documentation.   Thank you for involving me in the care of this patient.   Glade Boys PA-C Dept. of Neurosurgery     [1]  Social History Tobacco Use   Smoking status: Former    Current packs/day: 0.00    Average packs/day: 1 pack/day for 25.0 years (25.0 ttl pk-yrs)    Types: Cigarettes    Start date: 12/30/1976    Quit date: 12/30/2001    Years since quitting: 22.5   Smokeless tobacco: Never  Vaping Use   Vaping status: Never Used  Substance Use Topics   Alcohol  use: No    Alcohol /week: 0.0 standard drinks of alcohol     Comment: hx alcoholism--  stopped 1998    Drug use: No   "

## 2024-07-07 NOTE — Assessment & Plan Note (Signed)
 Minimally displaced distal sacral fracture after fall onto buttocks on 1/30 Seen in ER Reviewed hospital records, lab results and studies in detail  No neuro changes     Managing pain with percocet (takes from pain clinic)  Ice and donut pillow   Bruising noted on exam Able to sit for short periods today   Encouraged pt to reach out to Dr Ramos/pain clinic if no improvement

## 2024-07-07 NOTE — Assessment & Plan Note (Signed)
 Under psychiatric care Always struggling  Pt did ask for referral for new therapist Done today

## 2024-07-07 NOTE — Assessment & Plan Note (Signed)
 Last labs    Latest Ref Rng & Units 06/23/2024    3:34 PM 01/04/2024    4:30 PM 08/06/2023    4:12 PM  Hepatic Function  Total Protein 6.1 - 8.1 g/dL 7.3  6.7  7.7   Albumin 3.5 - 5.2 g/dL  4.0    AST 10 - 35 U/L 35  17  23   ALT 6 - 29 U/L 38  19  23   Alk Phosphatase 39 - 117 U/L  110    Total Bilirubin 0.2 - 1.2 mg/dL 0.4  0.4  0.4    In setting of chronic acetaminophen  (with percocet from pain clinc)- no additional No etoh  Likely fatty liver Follow up 1 mo for visit and re check Consider liver imaging

## 2024-07-07 NOTE — Patient Instructions (Addendum)
 Please check in with Dr Bonner about the sacral fracture , in case he has other ideas for comfort while it heals   Continue the donut pillow and ice   I put the referral in for mental health therapy for your depression  Please let us  know if you don't hear in 1-2 weeks to set that up (mychart message or call or letter)   I want to re check your urinalysis on a day you have had lots of water to make sure it is clear   You have one liver enzyme elevation , acetaminophen  ( in your percocet) may be causing this  You may also have a condition called fatty liver that can cause this   Follow up in a month    Have talk with your endocrinologist about weight  Not sure you would be a candidate for the GLP-1 medicine because of your gastroparesis and current inability to exercise   I still think the [facility] is a good idea if you can get there

## 2024-07-07 NOTE — Assessment & Plan Note (Signed)
 Bmi 37.14 With co morbidities of HTN, DM2, hyperlipidemia and sleep apnea   Mobility impaired  Tries to eat low glycemic  Overall low appetite   Not candidate for glp-1 due to chronic nausea/gastroparesis  ? If candidate for bariatric surgery  She plans to d/w endocrinology  Referral was made to healthy [facility] center Pt unsure if she could get there

## 2024-07-07 NOTE — Assessment & Plan Note (Signed)
 Percocet 3-4 times daily  Is helping in setting of new sacral fracture

## 2024-07-07 NOTE — Assessment & Plan Note (Signed)
 Last urinalysis dipped positive for blood  Was concentrated  No change clinically and urine culture negative  Will follow up in a month (very well hydrated) to repeat

## 2024-07-07 NOTE — Progress Notes (Signed)
 "  Subjective:    Patient ID: Alexa Woods, female    DOB: 01-20-1961, 64 y.o.   MRN: [id number]  HPI  Wt Readings from Last 3 Encounters:  07/07/24 232 lb 4 oz (105.3 kg)  06/30/24 229 lb 4.5 oz (104 kg)  06/23/24 230 lb 2 oz (104.4 kg)   37.49 kg/m  Vitals:   07/07/24 1202  BP: 128/68  Pulse: 74  Temp: (!) 97.4 F (36.3 C)  SpO2: 96%    Pt presents for  Lab follow up /fatigue Sacral fracture after a fall on the ice  Depression  Obesity    Was seen in ED on 1/30 after fall ontol tailbone and lower back on the ice  Also hit her elbow  No head injury Dx with minimally displaced distal sacrat fracture just proximal to the sacrococcygeal jxn    Using the donut pillow  This helps a bit  Also some ice/cold compress  Some bruising on the buttocks   Walking hurts (especially stairs)  Bed is the best  No changes in bowel control  Has had bm daily   Percocet three times daily (can have a 4th dose if needed)     Imaging DG Elbow Complete Left Result Date: 06/30/2024 CLINICAL DATA:  Clemens, left elbow pain EXAM: LEFT ELBOW - COMPLETE 3+ VIEW COMPARISON:  01/09/2019 FINDINGS: Frontal, bilateral oblique, lateral views of the left elbow are obtained on 4 images. No acute fracture, subluxation, or dislocation. Mild osteoarthritis. No joint effusion. Soft tissues are unremarkable. IMPRESSION: 1. No acute displaced fracture. 2. Mild osteoarthritis. Electronically Signed   By: Ozell Daring M.D.   On: 06/30/2024 20:20   DG Lumbar Spine 2-3 Views Result Date: 06/30/2024 CLINICAL DATA:  Clemens, lower back pain EXAM: LUMBAR SPINE - 2-3 VIEW COMPARISON:  07/31/2013 FINDINGS: Frontal and lateral views of the lumbar spine are obtained on 3 images. Five non-rib-bearing lumbar type vertebral bodies are in normal alignment. Minimally displaced distal sacral fracture just proximal to the sacrococcygeal junction, with near anatomic alignment. Mild multilevel spondylosis and facet  hypertrophy greatest at the L5-S1 level. Sacroiliac joints are unremarkable. IMPRESSION: 1. Minimally displaced distal sacral fracture only well seen on lateral view. Near anatomic alignment. 2. Lower lumbar degenerative changes. Electronically Signed   By: Ozell Daring M.D.   On: 06/30/2024 20:18   DG Sacrum/Coccyx Result Date: 06/30/2024 CLINICAL DATA:  Clemens, back pain EXAM: SACRUM AND COCCYX - 2+ VIEW COMPARISON:  None Available. FINDINGS: Frontal and lateral views of the sacrum and coccyx are obtained. There is a minimally displaced fracture of the distal sacrum just proximal to the sacrococcygeal junction, only seen on the lateral view. Alignment is near anatomic. No other acute bony abnormalities. Mild degenerative changes at the lumbosacral junction. IMPRESSION: 1. Minimally displaced distal sacral fracture just proximal to the sacrococcygeal junction, with near anatomic alignment. Electronically Signed   By: Ozell Daring M.D.   On: 06/30/2024 20:15    Recommended she continue current pain regimen from her pain management clinic  Encouraged use of donut seat and ice of ice/heat  Was given ibuprofen  600 mg    At last visit did labs for fatigue  Office Visit on 06/23/2024  Component Date Value Ref Range Status   Vit D, 25-Hydroxy 06/23/2024 36  30 - 100 ng/mL Final   Comment: Vitamin D  Status         25-OH Vitamin D : . Deficiency:                    <  20 ng/mL Insufficiency:             20 - 29 ng/mL Optimal:                 > or = 30 ng/mL . For 25-OH Vitamin D  testing on patients on  D2-supplementation and patients for whom quantitation  of D2 and D3 fractions is required, the [facility](TM) 25-OH VIT D, (D2,D3), LC/MS/MS is recommended: order  code 07111 (patients >34yrs). . See Note 1 . Note 1 . For additional information, please refer to  http://education.[facility].com/faq/FAQ199  (This link is being provided for informational/ educational purposes only.)     Iron 06/23/2024 73  45 - 160 mcg/dL Final   WBC 98/76/7973 7.4  3.8 - 10.8 Thousand/uL Final   RBC 06/23/2024 5.03  3.80 - 5.10 Million/uL Final   Hemoglobin 06/23/2024 13.8  11.7 - 15.5 g/dL Final   HCT 98/76/7973 41.9  35.9 - 46.0 % Final   MCV 06/23/2024 83.3  81.4 - 101.7 fL Final   MCH 06/23/2024 27.4  27.0 - 33.0 pg Final   MCHC 06/23/2024 32.9  31.6 - 35.4 g/dL Final   RDW 98/76/7973 13.2  11.0 - 15.0 % Final   Platelets 06/23/2024 213  140 - 400 Thousand/uL Final   MPV 06/23/2024 11.1  7.5 - 12.5 fL Final   Neutro Abs 06/23/2024 5,136  1,500 - 7,800 cells/uL Final   Absolute Lymphocytes 06/23/2024 1,628  850 - 3,900 cells/uL Final   Absolute Monocytes 06/23/2024 429  200 - 950 cells/uL Final   Eosinophils Absolute 06/23/2024 148  15 - 500 cells/uL Final   Basophils Absolute 06/23/2024 59  0 - 200 cells/uL Final   Neutrophils Relative % 06/23/2024 69.4  % Final   Total Lymphocyte 06/23/2024 22.0  % Final   Monocytes Relative 06/23/2024 5.8  % Final   Eosinophils Relative 06/23/2024 2.0  % Final   Basophils Relative 06/23/2024 0.8  % Final   Vitamin B-12 06/23/2024 465  200 - 1,100 pg/mL Final   TSH 06/23/2024 3.24  0.40 - 4.50 mIU/L Final   Glucose, Bld 06/23/2024 91  65 - 99 mg/dL Final   Comment: .            Fasting reference interval .    BUN 06/23/2024 18  7 - 25 mg/dL Final   Creat 98/76/7973 0.90  0.50 - 1.05 mg/dL Final   eGFR 98/76/7973 73  > OR = 60 mL/min/1.75m2 Final   BUN/Creatinine Ratio 06/23/2024 SEE NOTE:  6 - 22 (calc) Final   Comment:    Not Reported: BUN and Creatinine are within    reference range. .    Sodium 06/23/2024 141  135 - 146 mmol/L Final   Potassium 06/23/2024 3.9  3.5 - 5.3 mmol/L Final   Chloride 06/23/2024 102  98 - 110 mmol/L Final   CO2 06/23/2024 31  20 - 32 mmol/L Final   Calcium  06/23/2024 9.3  8.6 - 10.4 mg/dL Final   Total Protein 98/76/7973 7.3  6.1 - 8.1 g/dL Final   Albumin 98/76/7973 4.1  3.6 - 5.1 g/dL Final   Globulin  98/76/7973 3.2  1.9 - 3.7 g/dL (calc) Final   AG Ratio 06/23/2024 1.3  1.0 - 2.5 (calc) Final   Total Bilirubin 06/23/2024 0.4  0.2 - 1.2 mg/dL Final   Alkaline phosphatase (APISO) 06/23/2024 195 (H)  37 - 153 U/L Final   AST 06/23/2024 35  10 - 35 U/L Final  ALT 06/23/2024 38 (H)  6 - 29 U/L Final   Color, UA 06/23/2024 Yellow   Final   Clarity, UA 06/23/2024 Clear   Final   Glucose, UA 06/23/2024 Positive (A)  Negative Final   1000 mg/dL   Bilirubin, UA 98/76/7973 Negative   Final   Ketones, UA 06/23/2024 Negative   Final   Spec Grav, UA 06/23/2024 1.020  1.010 - 1.025 Final   Blood, UA 06/23/2024 10 Ery/uL   Final   pH, UA 06/23/2024 6.0  5.0 - 8.0 Final   Protein, UA 06/23/2024 Negative  Negative Final   Urobilinogen, UA 06/23/2024 0.2  0.2 or 1.0 E.U./dL Final   Nitrite, UA 98/76/7973 Negative   Final   Leukocytes, UA 06/23/2024 Negative  Negative Final   MICRO NUMBER: 06/23/2024 [id number]   Final   SPECIMEN QUALITY: 06/23/2024 Adequate   Final   Sample Source 06/23/2024 URINE   Final   STATUS: 06/23/2024 FINAL   Final   Result: 06/23/2024 No Growth   Final   Elevated ALT mild at 38 Does take percocet from pain clinic  Acetaminophen  -no extra in addition to percocet  Alk phos 195   Urine culture was neg  Urinalysis did note small blood    Also was ref to neuro surgery visit on 12h planned for the cervical deg changes seen on MRI   (pulmonary ordered it)   Sees psychiatry Needs a new mental health therapist    Patient Active Problem List   Diagnosis Date Noted   Elevated ALT measurement 07/07/2024   Sacral fracture (HCC) 07/07/2024   Abnormal urinalysis 07/07/2024   OSA (obstructive sleep apnea) 06/25/2024   Urinary retention 06/25/2024   Hematuria 06/25/2024   Diabetes mellitus (HCC) 06/25/2024   Fatigue 06/23/2024   Financial difficulties 05/09/2024   Mobility impaired 05/09/2024   Muscle cramps 01/04/2024   SOB (shortness of breath) on exertion 07/19/2023    CAD (coronary artery disease) 04/21/2023   Chest pain 04/13/2023   Right shoulder injury 03/16/2023   Pedal edema 01/01/2023   Frequent falls 08/14/2022   Statin myopathy 07/29/2022   Aortic atherosclerosis 07/29/2022   Chronic pain 02/10/2022   Transportation insecurity 05/09/2021   Financial insecurity 05/09/2021   Colon cancer screening 11/08/2020   Current use of proton pump inhibitor 11/04/2020   History of total knee replacement, right 10/21/2020   Trigger finger of right hand 12/20/2019   Dry eyes 09/05/2019   Intractable headache 08/25/2019   Hypothyroidism 04/04/2019   Screening mammogram, encounter for 04/03/2019   Routine general medical examination at a health care facility 04/03/2019   Encounter for screening for HIV 04/03/2019   Vitamin D  deficiency 04/03/2019   Intertrigo 02/21/2019   Chronic constipation 12/26/2017   History of left knee replacement 12/26/2017   Primary osteoarthritis involving multiple joints 12/13/2017   Primary osteoarthritis of left knee 12/06/2017   Chronic neck pain 08/13/2017   Pain in left knee 06/22/2017   Morbid obesity (HCC) 01/21/2017   Joint swelling 12/20/2013   Periodontal disease 02/03/2013   Hyperlipidemia associated with type 2 diabetes mellitus (HCC) 09/27/2012   Cough 03/04/2011   Low back pain 07/22/2010   Epigastric pain 04/18/2010   Hypokalemia 12/12/2008   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   Type 2 diabetes mellitus with peripheral neuropathy (HCC) 11/30/2006   History of alcohol  abuse 11/30/2006   Depression with anxiety 11/30/2006   Essential hypertension 11/30/2006   Hemorrhoids 11/30/2006   Allergic rhinitis 11/30/2006   Moderate asthma  11/30/2006   GERD without esophagitis 11/30/2006   Psoriasis 11/30/2006   Past Medical History:  Diagnosis Date   Alcohol  abuse, in remission    since 1998   Allergic rhinitis    Anemia    Anxiety    Asthma    Bilateral lower extremity edema    Bulging lumbar disc     Bulging of cervical intervertebral disc    Chronic constipation    DDD (degenerative disc disease), lumbosacral    Depression    Dyspnea    Gastroparesis    GERD (gastroesophageal reflux disease)    Hemorrhoids    History of Bell's palsy 08/2007   left   History of chronic cystitis    IBS (irritable bowel syndrome)    Insulin  dependent type 2 diabetes mellitus [facility])    endocrinologist-- dr trixie   Lower urinary tract symptoms (LUTS)    OA (osteoarthritis)    knees, back, hands, elbows   OSA (obstructive sleep apnea)    per study 06-08-2017 mild osa , cpap recommended , per pt insurance issue   Peripheral neuropathy    PONV (postoperative nausea and vomiting)    Psoriasis    S/P dilatation of esophageal stricture    Unspecified essential hypertension    Wears partial dentures    lower   Past Surgical History:  Procedure Laterality Date   CHOLECYSTECTOMY OPEN  1990   AND APPENDECTOMY   DOBUTAMINE  STRESS ECHO  2009    normal stress echo, no evidence of ischemia   ELBOW SURGERY Bilateral RIGHT 2013;  LEFT 2016   for nerve damage   ESOPHAGOGASTRODUODENOSCOPY  07/2002   erythematous gastropathy   eye sugery Bilateral    KNEE ARTHROSCOPY Left 11/ 2018   dr melodi  @ [facility]   KNEE ARTHROSCOPY W/ PARTIAL MEDIAL MENISCECTOMY Right 10/2008   and chondroplasty   SHOULDER ARTHROSCOPY WITH DISTAL CLAVICLE RESECTION Left 12/ 2011   dr dean   TOTAL KNEE ARTHROPLASTY Right 07-01-2009  dr melodi   [facility]   TOTAL KNEE ARTHROPLASTY Left 12/06/2017   Procedure: LEFT TOTAL LEFT KNEE ARTHROPLASTY;  Surgeon: Melodi Lerner, MD;  Location: [facility];  Service: Orthopedics;  Laterality: Left;   Trigger finger surgery Left 07/2022   VAGINAL HYSTERECTOMY  2003   Social History[1] Family History  Problem Relation Age of Onset   Alcohol  abuse Mother    Hypertension Mother    Diabetes Mother    Heart failure Mother    Diabetes Sister    Heart attack Sister    Diabetes Sister    Diabetes  Sister    Diabetes Sister    Celiac disease Daughter    Esophageal cancer Maternal Grandmother    Diabetes Brother    Parkinson's disease Brother    Heart attack Brother    Heart attack Brother 28   Diabetes Brother    Diabetes Brother    Diabetes Brother    Thyroid  disease Niece    Lung cancer Other        mat great uncle   Colon cancer Neg Hx    Breast cancer Neg Hx    Allergies[2] Medications Ordered Prior to Encounter[3]  Review of Systems  Constitutional:  Positive for fatigue and unexpected weight change. Negative for activity change, appetite change and fever.  HENT:  Negative for congestion, ear pain, rhinorrhea, sinus pressure and sore throat.   Eyes:  Negative for pain, redness and visual disturbance.  Respiratory:  Positive for wheezing. Negative for  cough and shortness of breath.   Cardiovascular:  Negative for chest pain and palpitations.  Gastrointestinal:  Negative for abdominal pain, blood in stool, constipation and diarrhea.  Endocrine: Negative for polydipsia and polyuria.  Genitourinary:  Negative for dysuria, frequency and urgency.  Musculoskeletal:  Positive for arthralgias, back pain, gait problem and neck pain. Negative for joint swelling and myalgias.  Skin:  Negative for pallor and rash.  Allergic/Immunologic: Negative for environmental allergies.  Neurological:  Negative for dizziness, syncope and headaches.  Hematological:  Negative for adenopathy. Does not bruise/bleed easily.  Psychiatric/Behavioral:  Negative for decreased concentration and dysphoric mood. The patient is not nervous/anxious.        Objective:   Physical Exam Constitutional:      General: She is not in acute distress.    Appearance: Normal appearance. She is well-developed. She is obese. She is not ill-appearing or diaphoretic.  HENT:     Head: Normocephalic and atraumatic.     Mouth/Throat:     Mouth: Mucous membranes are moist.  Eyes:     General: No scleral icterus.        Right eye: No discharge.        Left eye: No discharge.     Conjunctiva/sclera: Conjunctivae normal.     Pupils: Pupils are equal, round, and reactive to light.  Neck:     Thyroid : No thyromegaly.     Vascular: No carotid bruit or JVD.  Cardiovascular:     Rate and Rhythm: Normal rate and regular rhythm.     Heart sounds: Normal heart sounds.     No gallop.  Pulmonary:     Effort: Pulmonary effort is normal. No respiratory distress.     Breath sounds: Normal breath sounds. No wheezing or rales.  Abdominal:     General: There is no distension or abdominal bruit.     Palpations: Abdomen is soft.  Musculoskeletal:     Cervical back: Normal range of motion and neck supple.     Right lower leg: No edema.     Left lower leg: No edema.     Comments: Ecchymosis over bilateral low sacrum (evolving)  No palpable hematoma  Tender over midline distal sacrum/coccyx  No skin interruption     Lymphadenopathy:     Cervical: No cervical adenopathy.  Skin:    General: Skin is warm and dry.     Coloration: Skin is not pale.     Findings: No rash.  Neurological:     Mental Status: She is alert.     Cranial Nerves: No cranial nerve deficit.     Coordination: Coordination normal.     Deep Tendon Reflexes: Reflexes are normal and symmetric. Reflexes normal.  Psychiatric:        Attention and Perception: Attention normal.        Mood and Affect: Mood is anxious and depressed.        Speech: Speech normal.     Comments: Candidly discusses symptoms and stressors             Assessment & Plan:   Problem List Items Addressed This Visit       Musculoskeletal and Integument   Sacral fracture (HCC) - Primary   Minimally displaced distal sacral fracture after fall onto buttocks on 1/30 Seen in ER Reviewed hospital records, lab results and studies in detail  No neuro changes     Managing pain with percocet (takes from pain clinic)  Ice and donut  pillow   Bruising noted on  exam Able to sit for short periods today   Encouraged pt to reach out to Dr Ramos/pain clinic if no improvement          Other   Morbid obesity (HCC)   Bmi 37.14 With co morbidities of HTN, DM2, hyperlipidemia and sleep apnea   Mobility impaired  Tries to eat low glycemic  Overall low appetite   Not candidate for glp-1 due to chronic nausea/gastroparesis  ? If candidate for bariatric surgery  She plans to d/w endocrinology  Referral was made to healthy weight center Pt unsure if she could get there       Elevated ALT measurement   Last labs    Latest Ref Rng & Units 06/23/2024    3:34 PM 01/04/2024    4:30 PM 08/06/2023    4:12 PM  Hepatic Function  Total Protein 6.1 - 8.1 g/dL 7.3  6.7  7.7   Albumin 3.5 - 5.2 g/dL  4.0    AST 10 - 35 U/L 35  17  23   ALT 6 - 29 U/L 38  19  23   Alk Phosphatase 39 - 117 U/L  110    Total Bilirubin 0.2 - 1.2 mg/dL 0.4  0.4  0.4    In setting of chronic acetaminophen  (with percocet from pain clinc)- no additional No etoh  Likely fatty liver Follow up 1 mo for visit and re check Consider liver imaging               Depression with anxiety   Under psychiatric care Always struggling  Pt did ask for referral for new therapist Done today      Relevant Orders   Ambulatory referral to Psychology   Chronic pain   Percocet 3-4 times daily  Is helping in setting of new sacral fracture       Abnormal urinalysis   Last urinalysis dipped positive for blood  Was concentrated  No change clinically and urine culture negative  Will follow up in a month (very well hydrated) to repeat          [1]  Social History Tobacco Use   Smoking status: Former    Current packs/day: 0.00    Average packs/day: 1 pack/day for 25.0 years (25.0 ttl pk-yrs)    Types: Cigarettes    Start date: 12/30/1976    Quit date: 12/30/2001    Years since quitting: 22.5   Smokeless tobacco: Never  Vaping Use   Vaping status: Never Used  Substance Use  Topics   Alcohol  use: No    Alcohol /week: 0.0 standard drinks of alcohol     Comment: hx alcoholism--  stopped 1998    Drug use: No  [2]  Allergies Allergen Reactions   Bupropion Hcl Other (See Comments)    Pt is unsure   Cefuroxime Axetil Nausea Only   Crestor  [Rosuvastatin  Calcium ]     Pt states it makes her feel weird    Gabapentin  Other (See Comments)    Pt does not remember reaction  Pt does not remember reaction    Lidocaine Other (See Comments)    REACTION: unknown REACTION: unknown   Metformin Other (See Comments)    REACTION: GI REACTION: GI   Other Other (See Comments)   Paroxetine Other (See Comments)    REACTION: doesn't agree REACTION: doesn't agree   Propoxyphene Other (See Comments)    wheezing wheezing   Tramadol Other (See Comments)  REACTION: Causes Anxiety   Tramadol Hcl     REACTION: Causes Anxiety   Wellbutrin [Bupropion] Other (See Comments)    Pt is unsure   Codeine Nausea And Vomiting and Rash   Sulfa Antibiotics Rash and Other (See Comments)  [3]  Current Outpatient Medications on File Prior to Visit  Medication Sig Dispense Refill   albuterol  (VENTOLIN  HFA) 108 (90 Base) MCG/ACT inhaler INHALE 2 PUFFS INTO THE LUNGS EVERY 4 HOURS AS NEEDED FOR WHEEZING OR SHORTNESS OF BREATH. 18 each 0   Alcohol  Swabs (EASY COMFORT ALCOHOL  PADS) PADS Use 1 alcohol  pad before each fingerstick. 300 each 3   B-D UF III MINI PEN NEEDLES 31G X 5 MM MISC USE AS INSTRUCTED FOR 4X DAILY INJECTIONS 400 each 1   BD INSULIN  SYRINGE U/F 31G X 5/16 0.5 ML MISC USE THREE TIMES PER DAY WITH R INSULIN  & ONCE DAILY WITH LEVEMIR  400 each 2   Blood Glucose Calibration (TRUE METRIX LEVEL 1) Low SOLN Use to run controls on glucometer. 1 each 2   budesonide -glycopyrrolate-formoterol  (BREZTRI  AEROSPHERE) 160-9-4.8 MCG/ACT AERO inhaler Inhale 2 puffs into the lungs in the morning and at bedtime. 1 each 6   budesonide -glycopyrrolate-formoterol  (BREZTRI  AEROSPHERE) 160-9-4.8 MCG/ACT  AERO inhaler Inhale 2 puffs into the lungs in the morning and at bedtime. 11.8 g 0   cetirizine  (ZYRTEC  ALLERGY) 10 MG tablet Take 1 tablet (10 mg total) by mouth daily. 30 tablet 2   chlorhexidine  (PERIDEX ) 0.12 % solution Use as directed 15 mLs in the mouth or throat 2 (two) times daily. 120 mL 0   cholecalciferol (VITAMIN D3) 25 MCG (1000 UNIT) tablet Take 2,000 Units by mouth daily.     docusate sodium  (COLACE) 100 MG capsule Take 100 mg by mouth every morning.     ezetimibe  (ZETIA ) 10 MG tablet TAKE 1 TABLET BY MOUTH EVERY DAY 90 tablet 0   famotidine  (PEPCID ) 20 MG tablet TAKE 1 TABLET BY MOUTH TWICE A DAY 180 tablet 0   FARXIGA  10 MG TABS tablet TAKE 1 TABLET BY MOUTH DAILY BEFORE BREAKFAST. 90 tablet 3   fluticasone  (FLONASE ) 50 MCG/ACT nasal spray PLACE 2 SPRAYS INTO BOTH NOSTRILS DAILY AS NEEDED FOR ALLERGIES OR RHINITIS. 48 mL 1   glucose blood (ACCU-CHEK GUIDE TEST) test strip USE TO CHECK BLOOD SUGAR 4-5 TIMES A DAY. 500 strip 12   hydrochlorothiazide  (HYDRODIURIL ) 25 MG tablet TAKE 1 TABLET (25 MG TOTAL) BY MOUTH DAILY. 90 tablet 1   insulin  glargine (LANTUS  SOLOSTAR) 100 UNIT/ML Solostar Pen INJECT 30-35 UNITS DAILY 45 mL 1   Insulin  Pen Needle 32G X 4 MM MISC Use 4x a day 300 each 3   Insulin  Regular Human (NOVOLIN  R FLEXPEN RELION) 100 UNIT/ML KwikPen Inject 20 units before b'fast 10-18 units before lunch 10-18 units before dinner 15 mL 11   ipratropium-albuterol  (DUONEB) 0.5-2.5 (3) MG/3ML SOLN Take 3 mLs by nebulization every 4 (four) hours as needed. 360 mL 12   ketoconazole  (NIZORAL ) 2 % cream APPLY 1 APPLICATION TOPICALLY DAILY AS NEEDED FOR IRRITATION. 30 g 1   Lancets (ONETOUCH ULTRASOFT) lancets Use to test blood sugar 4 times daily as instructed. 200 each 11   levothyroxine  (SYNTHROID ) 75 MCG tablet Take 1 tablet (75 mcg total) by mouth daily. 90 tablet 3   LORazepam  (ATIVAN ) 2 MG tablet Take 2 mg by mouth 2 (two) times daily.     naloxone (NARCAN) 4 MG/0.1ML LIQD nasal  spray kit  oxyCODONE -acetaminophen  (PERCOCET) 10-325 MG tablet Take 1 tablet by mouth every 4 (four) hours as needed for pain. Hold for SBP < = 105 12 tablet 0   pantoprazole  (PROTONIX ) 40 MG tablet Take 40 mg by mouth daily. In am 30 min before food or medication or vitamins     potassium chloride  (KLOR-CON  M) 10 MEQ tablet TAKE 1 TABLET BY MOUTH EVERY DAY 90 tablet 1   sertraline  (ZOLOFT ) 100 MG tablet Take 200 mg by mouth every morning.     Spacer/Aero-Holding Chambers (OPTICHAMBER DIAMOND) MISC USE AS INSTRUCTED 1 each 0   tiZANidine  (ZANAFLEX ) 4 MG tablet Take 4 mg by mouth 2 (two) times daily as needed for muscle spasms.     triamcinolone  cream (KENALOG ) 0.1 % Apply 1 Application topically 2 (two) times daily.     zolpidem  (AMBIEN ) 10 MG tablet Take 10 mg by mouth at bedtime as needed for sleep.     No current facility-administered medications on file prior to visit.   "

## 2024-07-13 ENCOUNTER — Ambulatory Visit: Admitting: Orthopedic Surgery

## 2024-08-04 ENCOUNTER — Ambulatory Visit: Admitting: Family Medicine

## 2024-08-14 ENCOUNTER — Encounter

## 2024-08-14 ENCOUNTER — Ambulatory Visit: Admitting: Pulmonary Disease

## 2024-08-25 ENCOUNTER — Ambulatory Visit: Admitting: Internal Medicine

## 2024-09-12 ENCOUNTER — Ambulatory Visit
